# Patient Record
Sex: Female | Born: 1955 | Race: White | Hispanic: No | Smoking: Current every day smoker
Health system: Southern US, Community
[De-identification: ages and names within clinical notes are randomized; demographics above are authoritative.]

## PROBLEM LIST (undated history)

## (undated) DIAGNOSIS — F32A Depression, unspecified: Secondary | ICD-10-CM

## (undated) DIAGNOSIS — E559 Vitamin D deficiency, unspecified: Secondary | ICD-10-CM

## (undated) DIAGNOSIS — F17200 Nicotine dependence, unspecified, uncomplicated: Secondary | ICD-10-CM

## (undated) DIAGNOSIS — E785 Hyperlipidemia, unspecified: Secondary | ICD-10-CM

## (undated) DIAGNOSIS — G4700 Insomnia, unspecified: Secondary | ICD-10-CM

## (undated) DIAGNOSIS — Z7901 Long term (current) use of anticoagulants: Secondary | ICD-10-CM

## (undated) DIAGNOSIS — K760 Fatty (change of) liver, not elsewhere classified: Secondary | ICD-10-CM

## (undated) DIAGNOSIS — I700 Atherosclerosis of aorta: Secondary | ICD-10-CM

## (undated) DIAGNOSIS — I517 Cardiomegaly: Secondary | ICD-10-CM

## (undated) DIAGNOSIS — J449 Chronic obstructive pulmonary disease, unspecified: Secondary | ICD-10-CM

## (undated) DIAGNOSIS — M1990 Unspecified osteoarthritis, unspecified site: Secondary | ICD-10-CM

## (undated) DIAGNOSIS — N1831 Chronic kidney disease, stage 3a: Secondary | ICD-10-CM

## (undated) DIAGNOSIS — R0602 Shortness of breath: Secondary | ICD-10-CM

## (undated) DIAGNOSIS — M810 Age-related osteoporosis without current pathological fracture: Secondary | ICD-10-CM

## (undated) DIAGNOSIS — J439 Emphysema, unspecified: Secondary | ICD-10-CM

## (undated) DIAGNOSIS — K219 Gastro-esophageal reflux disease without esophagitis: Secondary | ICD-10-CM

## (undated) DIAGNOSIS — E278 Other specified disorders of adrenal gland: Secondary | ICD-10-CM

## (undated) DIAGNOSIS — I10 Essential (primary) hypertension: Secondary | ICD-10-CM

## (undated) DIAGNOSIS — F419 Anxiety disorder, unspecified: Secondary | ICD-10-CM

## (undated) DIAGNOSIS — G4730 Sleep apnea, unspecified: Secondary | ICD-10-CM

## (undated) DIAGNOSIS — K429 Umbilical hernia without obstruction or gangrene: Secondary | ICD-10-CM

## (undated) DIAGNOSIS — M5030 Other cervical disc degeneration, unspecified cervical region: Secondary | ICD-10-CM

## (undated) DIAGNOSIS — L409 Psoriasis, unspecified: Secondary | ICD-10-CM

## (undated) DIAGNOSIS — D72821 Monocytosis (symptomatic): Secondary | ICD-10-CM

## (undated) DIAGNOSIS — G4733 Obstructive sleep apnea (adult) (pediatric): Secondary | ICD-10-CM

## (undated) DIAGNOSIS — I4891 Unspecified atrial fibrillation: Secondary | ICD-10-CM

## (undated) DIAGNOSIS — I739 Peripheral vascular disease, unspecified: Secondary | ICD-10-CM

## (undated) DIAGNOSIS — K449 Diaphragmatic hernia without obstruction or gangrene: Secondary | ICD-10-CM

## (undated) DIAGNOSIS — M51369 Other intervertebral disc degeneration, lumbar region without mention of lumbar back pain or lower extremity pain: Secondary | ICD-10-CM

## (undated) DIAGNOSIS — R0789 Other chest pain: Secondary | ICD-10-CM

## (undated) DIAGNOSIS — Z8739 Personal history of other diseases of the musculoskeletal system and connective tissue: Secondary | ICD-10-CM

## (undated) DIAGNOSIS — IMO0002 Reserved for concepts with insufficient information to code with codable children: Secondary | ICD-10-CM

## (undated) DIAGNOSIS — R229 Localized swelling, mass and lump, unspecified: Secondary | ICD-10-CM

## (undated) DIAGNOSIS — M5136 Other intervertebral disc degeneration, lumbar region: Secondary | ICD-10-CM

## (undated) DIAGNOSIS — G250 Essential tremor: Secondary | ICD-10-CM

## (undated) DIAGNOSIS — K859 Acute pancreatitis without necrosis or infection, unspecified: Secondary | ICD-10-CM

## (undated) DIAGNOSIS — J432 Centrilobular emphysema: Secondary | ICD-10-CM

## (undated) HISTORY — DX: Gastro-esophageal reflux disease without esophagitis: K21.9

## (undated) HISTORY — DX: Psoriasis, unspecified: L40.9

## (undated) HISTORY — DX: Cardiomegaly: I51.7

## (undated) HISTORY — DX: Chronic obstructive pulmonary disease, unspecified: J44.9

## (undated) HISTORY — DX: Age-related osteoporosis without current pathological fracture: M81.0

## (undated) HISTORY — DX: Localized swelling, mass and lump, unspecified: R22.9

## (undated) HISTORY — DX: Unspecified osteoarthritis, unspecified site: M19.90

## (undated) HISTORY — DX: Personal history of other diseases of the musculoskeletal system and connective tissue: Z87.39

## (undated) HISTORY — DX: Diaphragmatic hernia without obstruction or gangrene: K44.9

## (undated) HISTORY — PX: OTHER SURGICAL HISTORY: SHX169

## (undated) HISTORY — PX: ACHILLES TENDON REPAIR: SUR1153

## (undated) HISTORY — DX: Emphysema, unspecified: J43.9

## (undated) HISTORY — PX: ANTERIOR CERVICAL DECOMP/DISCECTOMY FUSION: SHX1161

## (undated) HISTORY — DX: Reserved for concepts with insufficient information to code with codable children: IMO0002

## (undated) HISTORY — DX: Essential (primary) hypertension: I10

## (undated) HISTORY — DX: Sleep apnea, unspecified: G47.30

## (undated) HISTORY — PX: ABDOMINAL HYSTERECTOMY: SHX81

## (undated) HISTORY — DX: Monocytosis (symptomatic): D72.821

## (undated) HISTORY — DX: Hyperlipidemia, unspecified: E78.5

## (undated) HISTORY — PX: APPENDECTOMY: SHX54

## (undated) HISTORY — PX: HERNIA REPAIR: SHX51

## (undated) HISTORY — DX: Depression, unspecified: F32.A

## (undated) HISTORY — PX: TONSILLECTOMY: SUR1361

---

## 2004-07-16 ENCOUNTER — Other Ambulatory Visit: Payer: Self-pay

## 2004-07-17 ENCOUNTER — Other Ambulatory Visit: Payer: Self-pay

## 2004-09-04 ENCOUNTER — Ambulatory Visit: Payer: Self-pay | Admitting: Family Medicine

## 2005-09-07 ENCOUNTER — Ambulatory Visit: Payer: Self-pay | Admitting: Family Medicine

## 2006-01-04 ENCOUNTER — Ambulatory Visit: Payer: Self-pay | Admitting: Family Medicine

## 2006-03-02 ENCOUNTER — Emergency Department: Payer: Self-pay | Admitting: Emergency Medicine

## 2006-05-05 ENCOUNTER — Ambulatory Visit: Payer: Self-pay

## 2006-07-30 ENCOUNTER — Ambulatory Visit: Payer: Self-pay | Admitting: Family Medicine

## 2006-09-21 ENCOUNTER — Ambulatory Visit: Payer: Self-pay | Admitting: Family Medicine

## 2007-02-08 ENCOUNTER — Encounter: Payer: Self-pay | Admitting: Family Medicine

## 2007-02-15 ENCOUNTER — Ambulatory Visit: Payer: Self-pay | Admitting: Family Medicine

## 2007-03-01 ENCOUNTER — Encounter: Payer: Self-pay | Admitting: Family Medicine

## 2007-04-27 ENCOUNTER — Emergency Department: Payer: Self-pay | Admitting: Emergency Medicine

## 2007-05-18 ENCOUNTER — Ambulatory Visit (HOSPITAL_COMMUNITY): Admission: RE | Admit: 2007-05-18 | Discharge: 2007-05-19 | Payer: Self-pay | Admitting: Orthopedic Surgery

## 2007-11-14 ENCOUNTER — Ambulatory Visit: Payer: Self-pay | Admitting: Gastroenterology

## 2007-11-15 ENCOUNTER — Ambulatory Visit: Payer: Self-pay | Admitting: Family Medicine

## 2007-11-15 ENCOUNTER — Ambulatory Visit: Payer: Self-pay

## 2009-01-18 ENCOUNTER — Emergency Department: Payer: Self-pay | Admitting: Emergency Medicine

## 2009-02-18 ENCOUNTER — Ambulatory Visit: Payer: Self-pay

## 2009-04-12 ENCOUNTER — Emergency Department: Payer: Self-pay | Admitting: Emergency Medicine

## 2011-04-14 NOTE — Op Note (Signed)
[name], BUNDRICK               ACCOUNT NO.:  [id number]   MEDICAL RECORD NO.:  [id number]          PATIENT TYPE:  OIB   LOCATION:  2550                         FACILITY:  [facility]   PHYSICIAN:  Alvy Beal, MD    DATE OF BIRTH:  1955-12-08   DATE OF PROCEDURE:  05/18/2007  DATE OF DISCHARGE:                               OPERATIVE REPORT   PREOPERATIVE DIAGNOSIS:  Hard disk osteophyte causing left radicular arm  pain at C6-7.   POSTOPERATIVE DIAGNOSIS:  Hard disk osteophyte causing left radicular  arm pain at C6-7.   OPERATIVE PROCEDURE:  Anterior cervical diskectomy and fusion C6-C7.   COMPLICATIONS:  None.   CONDITION:  Stable.   HISTORY:  This is a very pleasant woman who presented in my office with  severe neck and left arm pain.  Preoperative imaging and clinical exam  confirmed the diagnosis of C7 radiculopathy due to a hard disk  osteophyte compressing the C7 nerve root at C6-7.  After attempting  conservative management, the patient elected to proceed with surgery.  All appropriate risks, benefits and alternatives to surgery were  explained to the patient and consent was obtained.   INSTRUMENTATION USED:  Synthes anterior Vector plate with 14 mm variable  angle screws with a 7 mm lordotic spacer.   OPERATIVE NOTE:  The patient was brought into the operating room and  placed supine on the operating room table.  After successful induction  of general anesthesia and endotracheal intubation, TEDs, SCDs and a  Foley were applied.  The roll of towels were placed between the neck.  The arms were cocooned at the side and tape was used to inferiorly  depress the shoulder blades.  The neck was then prepped and draped in a  standard fashion.  A longitudinal incision was made at the inferior  aspect of the thyroid cartilage and proceeding to the left lateral side.  Sharp dissection was carried out down to the platysma.  The platysma was  then sharply dissected to expose  the deep cervical fascia.  The deep  cervical fascia was then sharply dissected in a plane that kept the  trachea and esophagus medial and the carotid sheath lateral.  Once we  had dissected through the deep cervical fascia, I bluntly dissected  through the prevertebral fascia down to the anterior aspect of the  spine.  Appendiceal retractor was then used to sweep the esophagus and  the trachea medially and I could see the anterior longitudinal ligament.  I then placed an 18 gauge spinal needle into the interbody space and  took a lateral x-ray.  Counting from C2 down, I was at the C6-7  interbody space.  I then marked this disk space with the Bovie and then  removed the pen.   I then dissected the anterior longitudinal ligament from the mid body of  C6 to the mid body of C7 and dissected out the longus coli muscles out  to visualize the uncovertebral joint.  The Caspar self-retaining  cervical retracting blades were then placed in the wound.  The  endotracheal cuff  was deflated.  The blades were expanded to the  appropriate width and the endotracheal cuff was reexpanded.  I then put  2 distraction pins in, 1 in the body of C6 and 1 in the body of C7 and  distracted the 6-7 interspace.  A knife was then used to perform an  annulotomy and then used a combination of curets and pituitary rongeurs.  I resected the disk.  Using a 3-mm Kerrison, I resected the anterior  overhang of the osteophyte from the body of C6.  I then used fine curets  to develop a plane between the posterior longitudinal ligament and the  dura.  Then using the 1-mm Kerrison, I resected the posterior  longitudinal ligament.  I then used that 1-mm Kerrison to resect the  osteophytes that were overhanging in the uncovertebral joint.   At this point, with an adequate decompression completed, I irrigated the  wound copiously with normal saline.  I then used a curet to pierce the  end plates to get bleeding end plates and  then measured the interbody  space.  I then placed a precut 7 mm lordotic and KLS cervical graft  packed with DBX into the wound.  I confirmed the appropriate depth.  I  then placed a contoured plate onto the body of C6-7 and secured it with  14 mm variable angled screws.  Using fluoroscopy, I confirmed the depth  of the graft and appropriate position of the hardware in both the AP and  lateral planes.   At this point, all of the retractors were removed.  I then checked the  esophagus to ensure that it was not entrapped underneath the plate and  then returned the trachea and esophagus to midline.  I then irrigated  the wound copiously with normal saline, closed the platysma with  interrupted 2-0 Vicryls sutures and the skin with 3-0 Monocryl.  Steri-  Strips and dry dressing were applied.  The patient was extubated and  transferred to the PACU without incident.  At the end of the case, all  needle and sponge counts were correct.   The patient will be admitted today and discharged tomorrow.  She will  follow up in 2 weeks for a wound check.      Alvy Beal, MD  Electronically Signed     DDB/MEDQ  D:  05/18/2007  T:  05/18/2007  Job:  [id number]

## 2011-04-17 NOTE — H&P (Signed)
NAMEKRISTENA, [name]               ACCOUNT NO.:  [id number]   MEDICAL RECORD NO.:  [id number]          PATIENT TYPE:  OIB   LOCATION:  5010                         FACILITY:  [facility]   PHYSICIAN:  Alvy Beal, MD    DATE OF BIRTH:  1956/03/05   DATE OF ADMISSION:  05/18/2007  DATE OF DISCHARGE:  05/19/2007                              HISTORY & PHYSICAL   HISTORY:  [name] is a very pleasant 55 year old woman, a primary  caregiver, who has been experiencing significant neck and left arm pain  for the previous 6-7 months.  She could not recall any specific injury,  but she does note that her pain began as just neck pain, and then has  gradually gotten worse.  She now has what she declares as debilitating  left arm pain.  She has tried a course of physiotherapy, which made  things worse.  She states that her problem is now preventing her from  doing anything significant.   PAST MEDICAL HISTORY:   PAST SURGICAL HISTORY:   FAMILY HISTORY:   SOCIAL HISTORY:  She has no known drug allergies.  She smokes a pack a  day.  She does not drink. She has history of high blood pressure and  some pulmonary issues secondary to her smoking.   She is taking albuterol, aspirin, calcium plus D, muscle relaxants,  Vicodin, lorazepam, lovastatin, and some other antihypertensive  medication she cannot recall.   PHYSICAL EXAMINATION:  GENERAL:  She is a pleasant woman who appears her  stated age, in no acute distress.  She is alert and oriented x3.  NEUROLOGIC:  She has downgoing toes on Babinski testing.  She has  positive Spurling sign on the left upper extremity.  She has 5/5  strength throughout in the upper extremity except for the wrist flexors,  and her triceps both being 4/5 on that left side.  Symmetrical deep  tendon reflexes are 2+ and regular, except for the triceps on the right  which is 1+.   She has a normal gait pattern, negative Hoffman, negative Babinski sign.   MRI demonstrates  a hard disk osteophyte at C6-7 causing mild  retrolisthesis of C6 on C7, with significant left lateral recess  stenosis, and moderate right lateral recess stenosis.   Because of the ongoing pain and neurological deficits, and the failure  of conservative management, we elected to proceed with an anterior  cervical diskectomy and fusion.  As per my office notes, her clinical  examination is essentially unchanged prior to surgery.  I have reviewed  all the risks, benefits, and alternatives. Please again refer to my  office notes for specifics, and the patient consented to the  aforementioned procedure.      Alvy Beal, MD  Electronically Signed     DDB/MEDQ  D:  07/20/2007  T:  07/20/2007  Job:  [id number]

## 2011-04-17 NOTE — H&P (Signed)
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   HISTORY:  She is a very pleasant 55 year old woman who presented to my  office with complaints of severe left arm pain and neck pain.  Attempts  at conservative management consisting of physiotherapy and other  modalities had failed to alleviate her symptoms.  After discussing  treatment options, we elected to proceed with surgery.  All appropriate  risks, benefits and alternatives were explained to the patient and  consent was obtained.   The patient's preoperative evaluation was consistent with a C7  radiculopathy with tricep weakness and sensory changes in the C7  distribution on the left.  She had no other significant abnormalities.  Appropriate preoperative clearance was obtained.  The patient was taken  the operating room on May 18, 2007 for a planned anterior cervical  diskectomy and fusion at C6-7.  Please refer to my office notes that are  in her hospital chart for further specifics on her diagnosis and  condition.      Alvy Beal, MD  Electronically Signed     DDB/MEDQ  D:  08/09/2007  T:  08/10/2007  Job:  [id number]

## 2011-07-01 ENCOUNTER — Ambulatory Visit: Payer: Self-pay | Admitting: Family Medicine

## 2011-09-17 LAB — CBC
HCT: 42.6
Hemoglobin: 14.6
MCHC: 34.2
MCV: 91.4
Platelets: 342
RBC: 4.66
RDW: 14.7 — ABNORMAL HIGH
WBC: 10.3

## 2011-09-17 LAB — URINALYSIS, ROUTINE W REFLEX MICROSCOPIC
Bilirubin Urine: NEGATIVE
Glucose, UA: NEGATIVE
Hgb urine dipstick: NEGATIVE
Ketones, ur: NEGATIVE
Leukocytes, UA: NEGATIVE
Nitrite: NEGATIVE
Protein, ur: NEGATIVE
Specific Gravity, Urine: 1.011 (ref 1.005–1.035)
Urobilinogen, UA: 0.2
pH: 6

## 2011-09-17 LAB — BASIC METABOLIC PANEL
BUN: 8
CO2: 28
Calcium: 9.4
Chloride: 103
Creatinine, Ser: 0.66
GFR calc Af Amer: >60
GFR calc non Af Amer: >60
Glucose, Bld: 96
Potassium: 4.2
Sodium: 140

## 2011-09-17 LAB — APTT: aPTT: 30

## 2011-09-17 LAB — PROTIME-INR
INR: 1
Prothrombin Time: 13.2

## 2013-10-24 ENCOUNTER — Ambulatory Visit: Payer: Self-pay | Admitting: Gastroenterology

## 2013-11-03 ENCOUNTER — Ambulatory Visit: Payer: Self-pay | Admitting: Gastroenterology

## 2014-03-23 ENCOUNTER — Emergency Department: Payer: Self-pay | Admitting: Emergency Medicine

## 2014-03-23 LAB — BASIC METABOLIC PANEL
Anion Gap: 3 — ABNORMAL LOW (ref 7–16)
BUN: 13 mg/dL (ref 7–18)
Calcium, Total: 9.3 mg/dL (ref 8.5–10.1)
Chloride: 108 mmol/L — ABNORMAL HIGH (ref 98–107)
Co2: 31 mmol/L (ref 21–32)
Creatinine: 0.99 mg/dL (ref 0.60–1.30)
EGFR (African American): >60
EGFR (Non-African Amer.): >60
Glucose: 103 mg/dL — ABNORMAL HIGH (ref 65–99)
Osmolality: 283 (ref 275–301)
Potassium: 3.6 mmol/L (ref 3.5–5.1)
Sodium: 142 mmol/L (ref 136–145)

## 2014-03-23 LAB — PROTIME-INR
INR: 0.9
Prothrombin Time: 12.5 secs (ref 11.5–14.7)

## 2014-03-23 LAB — CBC
HCT: 40.1 % (ref 35.0–47.0)
HGB: 12.9 g/dL (ref 12.0–16.0)
MCH: 30 pg (ref 26.0–34.0)
MCHC: 32.3 g/dL (ref 32.0–36.0)
MCV: 93 fL (ref 80–100)
Platelet: 308 10*3/uL (ref 150–440)
RBC: 4.31 10*6/uL (ref 3.80–5.20)
RDW: 14.7 % — ABNORMAL HIGH (ref 11.5–14.5)
WBC: 14.1 10*3/uL — ABNORMAL HIGH (ref 3.6–11.0)

## 2014-08-17 ENCOUNTER — Ambulatory Visit: Payer: Self-pay | Admitting: Family Medicine

## 2015-05-14 DIAGNOSIS — M51369 Other intervertebral disc degeneration, lumbar region without mention of lumbar back pain or lower extremity pain: Secondary | ICD-10-CM | POA: Insufficient documentation

## 2015-05-14 DIAGNOSIS — M5416 Radiculopathy, lumbar region: Secondary | ICD-10-CM | POA: Insufficient documentation

## 2015-05-14 DIAGNOSIS — F410 Panic disorder [episodic paroxysmal anxiety] without agoraphobia: Secondary | ICD-10-CM | POA: Insufficient documentation

## 2015-05-14 DIAGNOSIS — R252 Cramp and spasm: Secondary | ICD-10-CM | POA: Insufficient documentation

## 2015-05-14 DIAGNOSIS — E785 Hyperlipidemia, unspecified: Secondary | ICD-10-CM | POA: Insufficient documentation

## 2015-05-14 DIAGNOSIS — G4730 Sleep apnea, unspecified: Secondary | ICD-10-CM | POA: Insufficient documentation

## 2015-05-14 DIAGNOSIS — M5136 Other intervertebral disc degeneration, lumbar region: Secondary | ICD-10-CM | POA: Insufficient documentation

## 2015-05-14 DIAGNOSIS — F419 Anxiety disorder, unspecified: Secondary | ICD-10-CM | POA: Insufficient documentation

## 2015-05-14 DIAGNOSIS — K219 Gastro-esophageal reflux disease without esophagitis: Secondary | ICD-10-CM | POA: Insufficient documentation

## 2015-05-14 DIAGNOSIS — G250 Essential tremor: Secondary | ICD-10-CM | POA: Insufficient documentation

## 2015-05-14 DIAGNOSIS — F324 Major depressive disorder, single episode, in partial remission: Secondary | ICD-10-CM | POA: Insufficient documentation

## 2015-05-14 DIAGNOSIS — E669 Obesity, unspecified: Secondary | ICD-10-CM | POA: Insufficient documentation

## 2015-05-14 DIAGNOSIS — J449 Chronic obstructive pulmonary disease, unspecified: Secondary | ICD-10-CM | POA: Insufficient documentation

## 2015-05-14 DIAGNOSIS — J432 Centrilobular emphysema: Secondary | ICD-10-CM | POA: Insufficient documentation

## 2015-05-17 ENCOUNTER — Ambulatory Visit (INDEPENDENT_AMBULATORY_CARE_PROVIDER_SITE_OTHER): Payer: BLUE CROSS/BLUE SHIELD | Admitting: Unknown Physician Specialty

## 2015-05-17 ENCOUNTER — Encounter: Payer: Self-pay | Admitting: Unknown Physician Specialty

## 2015-05-17 VITALS — BP 143/81 | HR 82 | Temp 98.7°F | Ht 62.5 in | Wt 264.0 lb

## 2015-05-17 DIAGNOSIS — N189 Chronic kidney disease, unspecified: Secondary | ICD-10-CM | POA: Diagnosis not present

## 2015-05-17 DIAGNOSIS — F332 Major depressive disorder, recurrent severe without psychotic features: Secondary | ICD-10-CM

## 2015-05-17 DIAGNOSIS — G8929 Other chronic pain: Secondary | ICD-10-CM | POA: Insufficient documentation

## 2015-05-17 DIAGNOSIS — I129 Hypertensive chronic kidney disease with stage 1 through stage 4 chronic kidney disease, or unspecified chronic kidney disease: Secondary | ICD-10-CM

## 2015-05-17 DIAGNOSIS — F329 Major depressive disorder, single episode, unspecified: Secondary | ICD-10-CM | POA: Insufficient documentation

## 2015-05-17 MED ORDER — BUPROPION HCL ER (XL) 150 MG PO TB24: 150.0000 mg | ORAL_TABLET | Freq: Every day | ORAL | Status: DC

## 2015-05-17 MED ORDER — VALSARTAN-HYDROCHLOROTHIAZIDE 160-12.5 MG PO TABS: 1.0000 | ORAL_TABLET | Freq: Every day | ORAL | Status: DC

## 2015-05-17 MED ORDER — CLONAZEPAM 1 MG PO TABS: 1.0000 mg | ORAL_TABLET | Freq: Two times a day (BID) | ORAL | Status: DC

## 2015-05-17 MED ORDER — DULOXETINE HCL 30 MG PO CPEP: 30.0000 mg | ORAL_CAPSULE | Freq: Every day | ORAL | Status: DC

## 2015-05-17 MED ORDER — BUSPIRONE HCL 10 MG PO TABS: 10.0000 mg | ORAL_TABLET | Freq: Two times a day (BID) | ORAL | Status: DC

## 2015-05-17 NOTE — Progress Notes (Signed)
BP 143/81 mmHg  Pulse 82  Temp(Src) 98.7 F (37.1 C) (Oral)  Ht 5' 2.5" (1.588 m)  Wt 264 lb (119.75 kg)  BMI 47.49 kg/m2  SpO2 97%   Subjective:    Patient ID: [name], female    DOB: 10/30/1956, 59 y.o.   MRN: [id number]  HPI: [name] is a 59 y.o. female  Chief Complaint  Patient presents with  . Follow-up    Relevant past medical, surgical, family and social history reviewed and updated as indicated. Interim medical history since our last visit reviewed. Allergies and medications reviewed and updated.  1.  HYPERTENSION Patient is requesting refill(s). Hypertension status:  uncontrolled  H6  Satisfied with current treatment?  yes  H6  Duration of hypertension:  chronic  H4  BP monitoring frequency:  not checking - does have BP cuff at home.  H5  BP medication side effects:  no P1  Medication compliance:  usually uses medication as prescribed   P1  Aspirin:  yes  Recurrent headaches:  no  R10 Visual changes:  no  R2  Palpitations:  no  R4  Dyspnea:  yes, chronic for her with COPD  R5  Chest pain:  no  R4 Lower extremity edema:  yes  R4 Dizzy/lightheaded:  no  R10   2.  ANXIETY Patient is requesting refill(s). Mood status:  controlled  H6  Satisfied with current treatment?:  yes  H2 Medication side effects:  no P1  Medication compliance:  excellent compliance  P1 Symptom severity:  severe H3  Anxious mood:  yes  H2 Irritability:  yes  H8 Depressed mood:  yes  H2 .H: Depression Screen X  PHQ - 9: 9 Anhedonia:  yes  H3 Insomnia:  no  H8 Fatigue:  no  H8 Impaired concentration/indecisiveness:  yes  H8 Suicidal ideations:  no  H8 Palpitations:  no  H8 Hyperventilation:  no  H8 Panic attacks:  yes - last one about 3 weeks ago - they most often happen when she is in large crowds  H8 Agoraphobia:  yes  H8 Obsessive thoughts: no  H8 Compulsive behaviors:  no  H8  3.  HIP PAIN She was unable to go to the pain clinic as needed another  referral Duration:  chronic  H4  Involved hip:  right  H1  Mechanism of injury:  no trauma.  H6  Location:  lateral  H1 Onset:  sudden, spasm  H5  Severity:  severe H3  Quality:  sharp H2  Frequency:  a few times a day H5  Radiation:  yes  H6 Alleviating factors:  NSAIDs, carisoprodol .  H7  Status:  stable  H6 Relief with NSAIDs?:  mild  H7  Weakness with weight bearing:  yes  R8   Weakness with walking:  yes  R8 Paresthesias / decreased sensation:  no   R10  Swelling:  yes  R8 Redness:  no  R9  Fevers:  no  R1   Depression: My sister doesn't think my anti-depressants are working." States she will "cuss you out."  States she is in in pain and "don't sleep."  She had been on Proac which helped but was on the max dose and taken off.  Tried Effexor that didn't work.  Has not been on Cymbalta that she can remember.    Review of Systems  Psychiatric/Behavioral: Positive for sleep disturbance, decreased concentration and agitation. Negative for hallucinations and dysphoric mood. The patient is  nervous/anxious. The patient is not hyperactive.        See PHQ 9    Per HPI unless specifically indicated above     Objective:    BP 143/81 mmHg  Pulse 82  Temp(Src) 98.7 F (37.1 C) (Oral)  Ht 5' 2.5" (1.588 m)  Wt 264 lb (119.75 kg)  BMI 47.49 kg/m2  SpO2 97%  Wt Readings from Last 3 Encounters:  05/17/15 264 lb (119.75 kg)  02/22/15 261 lb (118.389 kg)    Physical Exam  Constitutional: She is oriented to person, place, and time. She appears well-developed and well-nourished. No distress.  HENT:  Head: Normocephalic and atraumatic.  Eyes: Conjunctivae and lids are normal. Right eye exhibits no discharge. Left eye exhibits no discharge. No scleral icterus.  Cardiovascular: Normal rate, regular rhythm and normal heart sounds.   Pulmonary/Chest: Effort normal. No respiratory distress.  Abdominal: Normal appearance. She exhibits no distension. There is no splenomegaly or  hepatomegaly. There is no tenderness.  Musculoskeletal: Normal range of motion.  Neurological: She is alert and oriented to person, place, and time.  Skin: Skin is intact. No rash noted. No pallor.  Psychiatric: She has a normal mood and affect. Her behavior is normal. Judgment and thought content normal.    Assessment & Plan:   Problem List Items Addressed This Visit      Cardiovascular and Mediastinum   Benign hypertension with chronic kidney disease    Good control today.  Last GFR was above 60      Relevant Medications   valsartan-hydrochlorothiazide (DIOVAN-HCT) 160-12.5 MG per tablet     Other   Major depression - Primary    Add Cymbalta and hopefully will help with pain      Relevant Medications   DULoxetine (CYMBALTA) 30 MG capsule   buPROPion (WELLBUTRIN XL) 150 MG 24 hr tablet   busPIRone (BUSPAR) 10 MG tablet   Chronic pain    Refer to pain clinic.  Hopefully Cymbalta will help      Relevant Medications   DULoxetine (CYMBALTA) 30 MG capsule   buPROPion (WELLBUTRIN XL) 150 MG 24 hr tablet   clonazePAM (KLONOPIN) 1 MG tablet   Other Relevant Orders   Ambulatory referral to Pain Clinic       Follow up plan: Return in about 6 weeks (around 06/28/2015) for depression.  Marland Kitchen

## 2015-05-17 NOTE — Assessment & Plan Note (Signed)
Add Cymbalta and hopefully will help with pain

## 2015-05-17 NOTE — Assessment & Plan Note (Signed)
Refer to pain clinic.  Hopefully Cymbalta will help

## 2015-05-17 NOTE — Assessment & Plan Note (Signed)
Good control today.  Last GFR was above 60

## 2015-06-12 ENCOUNTER — Telehealth: Payer: Self-pay | Admitting: Unknown Physician Specialty

## 2015-06-28 ENCOUNTER — Ambulatory Visit: Payer: BLUE CROSS/BLUE SHIELD | Admitting: Unknown Physician Specialty

## 2015-07-01 ENCOUNTER — Encounter: Payer: Self-pay | Admitting: Unknown Physician Specialty

## 2015-07-01 ENCOUNTER — Ambulatory Visit (INDEPENDENT_AMBULATORY_CARE_PROVIDER_SITE_OTHER): Payer: BLUE CROSS/BLUE SHIELD | Admitting: Unknown Physician Specialty

## 2015-07-01 VITALS — BP 152/76 | HR 90 | Temp 98.1°F | Ht 63.1 in | Wt 263.0 lb

## 2015-07-01 DIAGNOSIS — F322 Major depressive disorder, single episode, severe without psychotic features: Secondary | ICD-10-CM | POA: Diagnosis not present

## 2015-07-01 DIAGNOSIS — G8929 Other chronic pain: Secondary | ICD-10-CM

## 2015-07-01 DIAGNOSIS — F329 Major depressive disorder, single episode, unspecified: Secondary | ICD-10-CM | POA: Insufficient documentation

## 2015-07-01 NOTE — Assessment & Plan Note (Signed)
Continue present medications.  Doing much better

## 2015-07-01 NOTE — Progress Notes (Signed)
   BP 152/76 mmHg  Pulse 90  Temp(Src) 98.1 F (36.7 C)  Ht 5' 3.1" (1.603 m)  Wt 263 lb (119.296 kg)  BMI 46.43 kg/m2  SpO2 94%  LMP  (LMP Unknown)   Subjective:    Patient ID: [name], female    DOB: Apr 09, 1956, 59 y.o.   MRN: [id number]  HPI: [name] is a 59 y.o. female  Chief Complaint  Patient presents with  . Depression    Relevant past medical, surgical, family and social history reviewed and updated as indicated. Interim medical history since our last visit reviewed. Allergies and medications reviewed and updated.  Depression She is doing much better.  No more mood swings.  Not snapping at others.  PHQ 2 is 1.  States the Cymbalta is not helping her chronic pain.  For some reason, the pain clinic referral never went through.      Review of Systems  Per HPI unless specifically indicated above     Objective:    BP 152/76 mmHg  Pulse 90  Temp(Src) 98.1 F (36.7 C)  Ht 5' 3.1" (1.603 m)  Wt 263 lb (119.296 kg)  BMI 46.43 kg/m2  SpO2 94%  LMP  (LMP Unknown)  Wt Readings from Last 3 Encounters:  07/01/15 263 lb (119.296 kg)  05/17/15 264 lb (119.75 kg)  02/22/15 261 lb (118.389 kg)    Physical Exam  Constitutional: She is oriented to person, place, and time. She appears well-developed and well-nourished. No distress.  Uses a walker  HENT:  Head: Normocephalic and atraumatic.  Eyes: Conjunctivae and lids are normal. Right eye exhibits no discharge. Left eye exhibits no discharge. No scleral icterus.  Pulmonary/Chest: Effort normal. No respiratory distress.  Abdominal: Normal appearance. There is no splenomegaly or hepatomegaly.  Musculoskeletal: Normal range of motion.  Neurological: She is alert and oriented to person, place, and time.  Skin: Skin is intact. No rash noted. No pallor.  Psychiatric: She has a normal mood and affect. Her behavior is normal. Judgment and thought content normal.      Assessment & Plan:   Problem List  Items Addressed This Visit      Unprioritized   Chronic pain - Primary   Relevant Orders   Ambulatory referral to Pain Clinic   Major depression, chronic    Continue present medications.  Doing much better          Follow up plan: Return for physical.

## 2015-07-04 ENCOUNTER — Other Ambulatory Visit: Payer: Self-pay | Admitting: Unknown Physician Specialty

## 2015-07-11 ENCOUNTER — Ambulatory Visit: Payer: BLUE CROSS/BLUE SHIELD | Attending: Pain Medicine | Admitting: Pain Medicine

## 2015-07-11 ENCOUNTER — Encounter: Payer: Self-pay | Admitting: Pain Medicine

## 2015-07-11 VITALS — BP 160/46 | HR 87 | Temp 98.2°F | Resp 18 | Ht 63.0 in | Wt 260.0 lb

## 2015-07-11 DIAGNOSIS — M79605 Pain in left leg: Secondary | ICD-10-CM | POA: Diagnosis present

## 2015-07-11 DIAGNOSIS — M4806 Spinal stenosis, lumbar region: Secondary | ICD-10-CM | POA: Insufficient documentation

## 2015-07-11 DIAGNOSIS — I10 Essential (primary) hypertension: Secondary | ICD-10-CM | POA: Diagnosis not present

## 2015-07-11 DIAGNOSIS — K219 Gastro-esophageal reflux disease without esophagitis: Secondary | ICD-10-CM | POA: Diagnosis not present

## 2015-07-11 DIAGNOSIS — M1288 Other specific arthropathies, not elsewhere classified, other specified site: Secondary | ICD-10-CM | POA: Diagnosis not present

## 2015-07-11 DIAGNOSIS — M1710 Unilateral primary osteoarthritis, unspecified knee: Secondary | ICD-10-CM | POA: Insufficient documentation

## 2015-07-11 DIAGNOSIS — M5136 Other intervertebral disc degeneration, lumbar region: Secondary | ICD-10-CM | POA: Insufficient documentation

## 2015-07-11 DIAGNOSIS — F17200 Nicotine dependence, unspecified, uncomplicated: Secondary | ICD-10-CM | POA: Diagnosis not present

## 2015-07-11 DIAGNOSIS — M545 Low back pain: Secondary | ICD-10-CM | POA: Diagnosis present

## 2015-07-11 DIAGNOSIS — M170 Bilateral primary osteoarthritis of knee: Secondary | ICD-10-CM

## 2015-07-11 DIAGNOSIS — M47816 Spondylosis without myelopathy or radiculopathy, lumbar region: Secondary | ICD-10-CM | POA: Insufficient documentation

## 2015-07-11 DIAGNOSIS — Z981 Arthrodesis status: Secondary | ICD-10-CM | POA: Insufficient documentation

## 2015-07-11 DIAGNOSIS — M5416 Radiculopathy, lumbar region: Secondary | ICD-10-CM | POA: Diagnosis not present

## 2015-07-11 DIAGNOSIS — M79604 Pain in right leg: Secondary | ICD-10-CM | POA: Diagnosis present

## 2015-07-11 DIAGNOSIS — F418 Other specified anxiety disorders: Secondary | ICD-10-CM | POA: Insufficient documentation

## 2015-07-11 DIAGNOSIS — M48062 Spinal stenosis, lumbar region with neurogenic claudication: Secondary | ICD-10-CM | POA: Insufficient documentation

## 2015-07-11 DIAGNOSIS — M179 Osteoarthritis of knee, unspecified: Secondary | ICD-10-CM | POA: Insufficient documentation

## 2015-07-11 NOTE — Patient Instructions (Addendum)
Smoking Cessation Quitting smoking is important to your health and has many advantages. However, it is not always easy to quit since nicotine is a very addictive drug. Oftentimes, people try 3 times or more before being able to quit. This document explains the best ways for you to prepare to quit smoking. Quitting takes hard work and a lot of effort, but you can do it. ADVANTAGES OF QUITTING SMOKING 1. You will live longer, feel better, and live better. 2. Your body will feel the impact of quitting smoking almost immediately. 1. Within 20 minutes, blood pressure decreases. Your pulse returns to its normal level. 2. After 8 hours, carbon monoxide levels in the blood return to normal. Your oxygen level increases. 3. After 24 hours, the chance of having a heart attack starts to decrease. Your breath, hair, and body stop smelling like smoke. 4. After 48 hours, damaged nerve endings begin to recover. Your sense of taste and smell improve. 5. After 72 hours, the body is virtually free of nicotine. Your bronchial tubes relax and breathing becomes easier. 6. After 2 to 12 weeks, lungs can hold more air. Exercise becomes easier and circulation improves. 3. The risk of having a heart attack, stroke, cancer, or lung disease is greatly reduced. 1. After 1 year, the risk of coronary heart disease is cut in half. 2. After 5 years, the risk of stroke falls to the same as a nonsmoker. 3. After 10 years, the risk of lung cancer is cut in half and the risk of other cancers decreases significantly. 4. After 15 years, the risk of coronary heart disease drops, usually to the level of a nonsmoker. 4. If you are pregnant, quitting smoking will improve your chances of having a healthy baby. 5. The people you live with, especially any children, will be healthier. 6. You will have extra money to spend on things other than cigarettes. QUESTIONS TO THINK ABOUT BEFORE ATTEMPTING TO QUIT You may want to talk about your  answers with your health care provider. 1. Why do you want to quit? 2. If you tried to quit in the past, what helped and what did not? 3. What will be the most difficult situations for you after you quit? How will you plan to handle them? 4. Who can help you through the tough times? Your family? Friends? A health care provider? 5. What pleasures do you get from smoking? What ways can you still get pleasure if you quit? Here are some questions to ask your health care provider: 1. How can you help me to be successful at quitting? 2. What medicine do you think would be best for me and how should I take it? 3. What should I do if I need more help? 4. What is smoking withdrawal like? How can I get information on withdrawal? GET READY 1. Set a quit date. 2. Change your environment by getting rid of all cigarettes, ashtrays, matches, and lighters in your home, car, or work. Do not let people smoke in your home. 3. Review your past attempts to quit. Think about what worked and what did not. GET SUPPORT AND ENCOURAGEMENT You have a better chance of being successful if you have help. You can get support in many ways.  Tell your family, friends, and coworkers that you are going to quit and need their support. Ask them not to smoke around you.  Get individual, group, or telephone counseling and support. Programs are available at [employer] and health centers. Call  your local health department for information about programs in your area.  Spiritual beliefs and practices may help some smokers quit.  Download a "quit meter" on your computer to keep track of quit statistics, such as how long you have gone without smoking, cigarettes not smoked, and money saved.  Get a self-help book about quitting smoking and staying off tobacco. [place] yourself from urges to smoke. Talk to someone, go for a walk, or occupy your time with a task.  Change your normal routine. Take  a different route to work. Drink tea instead of coffee. Eat breakfast in a different place.  Reduce your stress. Take a hot bath, exercise, or read a book.  Plan something enjoyable to do every day. Reward yourself for not smoking.  Explore interactive web-based programs that specialize in helping you quit. GET MEDICINE AND USE IT CORRECTLY Medicines can help you stop smoking and decrease the urge to smoke. Combining medicine with the above behavioral methods and support can greatly increase your chances of successfully quitting smoking.  Nicotine replacement therapy helps deliver nicotine to your body without the negative effects and risks of smoking. Nicotine replacement therapy includes nicotine gum, lozenges, inhalers, nasal sprays, and skin patches. Some may be available over-the-counter and others require a prescription.  Antidepressant medicine helps people abstain from smoking, but how this works is unknown. This medicine is available by prescription.  Nicotinic receptor partial agonist medicine simulates the effect of nicotine in your brain. This medicine is available by prescription. Ask your health care provider for advice about which medicines to use and how to use them based on your health history. Your health care provider will tell you what side effects to look out for if you choose to be on a medicine or therapy. Carefully read the information on the package. Do not use any other product containing nicotine while using a nicotine replacement product.  RELAPSE OR DIFFICULT SITUATIONS Most relapses occur within the first 3 months after quitting. Do not be discouraged if you start smoking again. Remember, most people try several times before finally quitting. You may have symptoms of withdrawal because your body is used to nicotine. You may crave cigarettes, be irritable, feel very hungry, cough often, get headaches, or have difficulty concentrating. The withdrawal symptoms are only  temporary. They are strongest when you first quit, but they will go away within 10-14 days. To reduce the chances of relapse, try to:  Avoid drinking alcohol. Drinking lowers your chances of successfully quitting.  Reduce the amount of caffeine you consume. Once you quit smoking, the amount of caffeine in your body increases and can give you symptoms, such as a rapid heartbeat, sweating, and anxiety.  Avoid smokers because they can make you want to smoke.  Do not let weight gain distract you. Many smokers will gain weight when they quit, usually less than 10 pounds. Eat a healthy diet and stay active. You can always lose the weight gained after you quit.  Find ways to improve your mood other than smoking. FOR MORE INFORMATION  www.smokefree.gov  Document Released: 11/10/2001 Document Revised: 04/02/2014 Document Reviewed: 02/25/2012 [facility] Patient Information 2015 [place], [place]. This information is not intended to replace advice given to you by your health care provider. Make sure you discuss any questions you have with your health care provider.  Plan  Continue present medications for now  Lumbar epidural steroid injection to be performed at time return appointment  F/U  PCP [name] for evaliation of  BP and general medical  condition  F/U surgical evaluation. We will consider further surgical evaluation  F/U neurological evaluation. We will consider PNCV EMG studies and other studies dependent upon your response to treatment and follow-up evaluation  May consider radiofrequency rhizolysis or intraspinal procedures pending response to present treatment and F/U evaluation   Patient to call Pain Management Center should patient have concerns prior to scheduled return appointmen. Epidural Steroid Injection Patient Information  Description: The epidural space surrounds the nerves as they exit the spinal cord.  In some patients, the nerves can be compressed and inflamed by a bulging  disc or a tight spinal canal (spinal stenosis).  By injecting steroids into the epidural space, we can bring irritated nerves into direct contact with a potentially helpful medication.  These steroids act directly on the irritated nerves and can reduce swelling and inflammation which often leads to decreased pain.  Epidural steroids may be injected anywhere along the spine and from the neck to the low back depending upon the location of your pain.   After numbing the skin with local anesthetic (like Novocaine), a small needle is passed into the epidural space slowly.  You may experience a sensation of pressure while this is being done.  The entire block usually last less than 10 minutes.  Conditions which may be treated by epidural steroids:   Low back and leg pain  Neck and arm pain  Spinal stenosis  Post-laminectomy syndrome  Herpes zoster (shingles) pain  Pain from compression fractures  Preparation for the injection:  6. Do not eat any solid food or dairy products within 6 hours of your appointment.  7. You may drink clear liquids up to 2 hours before appointment.  Clear liquids include water, black coffee, juice or soda.  No milk or cream please. 8. You may take your regular medication, including pain medications, with a sip of water before your appointment  Diabetics should hold regular insulin (if taken separately) and take 1/2 normal NPH dos the morning of the procedure.  Carry some sugar containing items with you to your appointment. 9. A driver must accompany you and be prepared to drive you home after your procedure.  10. Bring all your current medications with your. 11. An IV may be inserted and sedation may be given at the discretion of the physician.   12. A blood pressure cuff, EKG and other monitors will often be applied during the procedure.  Some patients may need to have extra oxygen administered for a short period. 63. You will be asked to provide medical information,  including your allergies, prior to the procedure.  We must know immediately if you are taking blood thinners (like Coumadin/Warfarin)  Or if you are allergic to IV iodine contrast (dye). We must know if you could possible be pregnant.  Possible side-effects:  Bleeding from needle site  Infection (rare, may require surgery)  Nerve injury (rare)  Numbness & tingling (temporary)  Difficulty urinating (rare, temporary)  Spinal headache ( a headache worse with upright posture)  Light -headedness (temporary)  Pain at injection site (several days)  Decreased blood pressure (temporary)  Weakness in arm/leg (temporary)  Pressure sensation in back/neck (temporary)  Call if you experience:  Fever/chills associated with headache or increased back/neck pain.  Headache worsened by an upright position.  New onset weakness or numbness of an extremity below the injection site  Hives or difficulty breathing (go to the emergency  room)  Inflammation or drainage at the infection site  Severe back/neck pain  Any new symptoms which are concerning to you  Please note:  Although the local anesthetic injected can often make your back or neck feel good for several hours after the injection, the pain will likely return.  It takes 3-7 days for steroids to work in the epidural space.  You may not notice any pain relief for at least that one week.  If effective, we will often do a series of three injections spaced 3-6 weeks apart to maximally decrease your pain.  After the initial series, we generally will wait several months before considering a repeat injection of the same type.  If you have any questions, please call [phone] [facility]

## 2015-07-11 NOTE — Progress Notes (Signed)
Subjective:    Patient ID: [name], female    DOB: 1956/10/17, 59 y.o.   MRN: [id number]  HPI  Patient is 59 year old female who comes to pain management Center at the request of [name] for further evaluation and treatment of pain involving the lower back and lower extremity region especially the right lower extremity region. Patient states that her pain began years ago and has become progressively more severe and incapacitating. Patient is with history of neck fusion performed by Dr. Rolena Garner of [facility] orthopedics. She states that her pain is severely incapacitating in terms of lower back and lower extremity pain and described her pain as agonizing constant sharp stabbing tender throbbing sensation awakening patient from sleep. Patient stated the pain increases with motion and walking. Patient stated that her pain decreased with sitting. We will review the MRI findings on today's visit and decision has been made to proceed with lumbar epidural steroid injection at time of return appointment. We also explained to patient that patient may benefit from lumbar facet, medial branch nerve, blocks as well as other procedures. We also did discuss medication adjustments which we will consider the patient was understanding and in agreement status treatment plan. At this time we will proceed with lumbar epidural steroid injection at time return appointment. The patient was with understanding and in agreement with suggested treatment plan.   Review of Systems    cardiovascular High blood pressure Daily aspirin intake  Pulmonary Positive cigarette smoking  Neurological Unremarkable  Psychological Anxiety Depression Panic attacks  Gastrointestinal Gastroesophageal reflux disease  Genitourinary Unremarkable  Hematological Unremarkable  Endocrine Unremarkable  Rheumatological Unremarkable  Musculoskeletal Unremarkable  Other significant Unremarkable   Objective:    Physical Exam  There was tenderness of the splenius capitis and occipitalis musculature region of mild degree. There was mild tenderness of the region of the cervical facet cervical paraspinal muscular region as well as the thoracic facet and thoracic paraspinal musculature region. Patient appeared to be with slightly decreased grip strength. Tinel and Phalen's maneuver were without increase of pain significant degree. There was unremarkable Spurling's maneuver. Palpation over the thoracic facet thoracic paraspinal musculature region was a tends to palpation in the lower thoracic region of moderate degree. There was tenderness over the lumbar facet lumbar paraspinal musculature of moderate degree. Palpation over the left lumbar paraspinal muscles region was moderate degree palpation over the right lumbar paraspinal muscles region was moderately severe degree. Straight leg raising was tolerates approximately 20 without a definite increase of pain with dorsiflexion noted. There was negative clonus and negative Homans. Lateral bending rotation and extension and palpation of the lumbar facets reproduce moderately severe discomfort. There was moderate tennis of the PSIS and PII S regions. Abdomen was nontender and no costovertebral angle tenderness was noted.    Assessment & Plan:    Degenerative disc disease of the lumbar spine  Most predominant at L4-5. Multilevel facet arthropathy at L2-3, L3-4, L4-5, with disc space narrowing as well Disc desiccation at T12-L1 to L4-L5  Lumbar facet syndrome  Lumbar radiculopathy  Lumbar stenosis    Plan  Continue present medications  Lumbar epidural steroid injection to be performed at time return appointment  F/U PCP [name] for evaliation of  BP and general medical  condition  F/U surgical evaluation. We will have discussed surgical evaluation and will consider  F/U neurological evaluation. We will consider PNCV EMG studies and other  studies  May consider radiofrequency rhizolysis or intraspinal  procedures pending response to present treatment and F/U evaluation   Patient to call Pain Management Center should patient have concerns prior to scheduled return appointmen.

## 2015-07-11 NOTE — Progress Notes (Signed)
Safety precautions to be maintained throughout the outpatient stay will include: orient to surroundings, keep bed in low position, maintain call bell within reach at all times, provide assistance with transfer out of bed and ambulation.  

## 2015-07-17 ENCOUNTER — Encounter: Payer: Self-pay | Admitting: Pain Medicine

## 2015-07-17 ENCOUNTER — Ambulatory Visit: Payer: BLUE CROSS/BLUE SHIELD | Attending: Pain Medicine | Admitting: Pain Medicine

## 2015-07-17 VITALS — BP 113/43 | HR 78 | Temp 98.6°F | Resp 16 | Ht 63.0 in | Wt 260.0 lb

## 2015-07-17 DIAGNOSIS — M1288 Other specific arthropathies, not elsewhere classified, other specified site: Secondary | ICD-10-CM | POA: Insufficient documentation

## 2015-07-17 DIAGNOSIS — M79604 Pain in right leg: Secondary | ICD-10-CM | POA: Diagnosis present

## 2015-07-17 DIAGNOSIS — M48062 Spinal stenosis, lumbar region with neurogenic claudication: Secondary | ICD-10-CM

## 2015-07-17 DIAGNOSIS — M47816 Spondylosis without myelopathy or radiculopathy, lumbar region: Secondary | ICD-10-CM

## 2015-07-17 DIAGNOSIS — M170 Bilateral primary osteoarthritis of knee: Secondary | ICD-10-CM

## 2015-07-17 DIAGNOSIS — M545 Low back pain: Secondary | ICD-10-CM | POA: Diagnosis present

## 2015-07-17 DIAGNOSIS — M79605 Pain in left leg: Secondary | ICD-10-CM | POA: Diagnosis present

## 2015-07-17 DIAGNOSIS — M5126 Other intervertebral disc displacement, lumbar region: Secondary | ICD-10-CM | POA: Insufficient documentation

## 2015-07-17 MED ORDER — TRIAMCINOLONE ACETONIDE 40 MG/ML IJ SUSP
INTRAMUSCULAR | Status: AC
  Administered 2015-07-17: 12:00:00
  Filled 2015-07-17: qty 1

## 2015-07-17 MED ORDER — ORPHENADRINE CITRATE 30 MG/ML IJ SOLN
INTRAMUSCULAR | Status: AC
  Administered 2015-07-17: 12:00:00
  Filled 2015-07-17: qty 2

## 2015-07-17 MED ORDER — CEFAZOLIN SODIUM 1 G IJ SOLR
INTRAMUSCULAR | Status: AC
  Administered 2015-07-17: 1 g via INTRAVENOUS
  Filled 2015-07-17: qty 10

## 2015-07-17 MED ORDER — CEFUROXIME AXETIL 250 MG PO TABS: 250.0000 mg | ORAL_TABLET | Freq: Two times a day (BID) | ORAL | Status: DC

## 2015-07-17 MED ORDER — BUPIVACAINE HCL (PF) 0.25 % IJ SOLN
INTRAMUSCULAR | Status: AC
  Administered 2015-07-17: 12:00:00
  Filled 2015-07-17: qty 30

## 2015-07-17 MED ORDER — LIDOCAINE HCL (PF) 1 % IJ SOLN
INTRAMUSCULAR | Status: AC
  Administered 2015-07-17: 12:00:00
  Filled 2015-07-17: qty 5

## 2015-07-17 MED ORDER — FENTANYL CITRATE (PF) 100 MCG/2ML IJ SOLN
INTRAMUSCULAR | Status: AC
  Administered 2015-07-17: 100 ug via INTRAVENOUS
  Filled 2015-07-17: qty 2

## 2015-07-17 MED ORDER — MIDAZOLAM HCL 5 MG/5ML IJ SOLN
INTRAMUSCULAR | Status: AC
  Administered 2015-07-17: 2 mg via INTRAVENOUS
  Filled 2015-07-17: qty 5

## 2015-07-17 NOTE — Progress Notes (Signed)
   Subjective:    Patient ID: [name], female    DOB: 05-Sep-1956, 59 y.o.   MRN: [id number]  HPI  PROCEDURE PERFORMED: Lumbar epidural steroid injection   NOTE: The patient is a 59 y.o. female who returns to [facility] for further evaluation and treatment of pain involving the lumbar and lower extremity region.  MRI revealed the patient to be with degenerative disc disease of the lumbar spine most predominant at L4-5. 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 The risks, benefits, and expectations of the procedure have been discussed and explained to the patient who was understanding and in agreement with suggested treatment plan. We will proceed with lumbar epidural steroid injection as discussed and as explained to the patient who is willing to proceed with procedure as planned.   DESCRIPTION OF PROCEDURE: Lumbar epidural steroid injection with IV Versed, IV fentanyl conscious sedation, EKG, blood pressure, pulse, and pulse oximetry monitoring. The procedure was performed with the patient in the prone position under fluoroscopic guidance. A local anesthetic skin wheal of 1.5% plain lidocaine was accomplished at proposed entry site. An 18-gauge Tuohy epidural needle was inserted at the L  4 vertebral body level  right of the midline via loss-of-resistance technique with negative heme and negative CSF return. A total of 4 mL of Preservative-Free normal saline with 40 mg of Kenalog injected incrementally via epidurally placed needle. Needle was removed.    A total of 40 mg of Kenalog was utilized for the procedure.   The patient tolerated the injection well.    PLAN:   1. Medications: We will continue presently prescribed medications. 2. Will consider modification of treatment regimen pending response to treatment rendered on today's visit and follow-up evaluation. 3. The patient is to follow-up  with primary care physician Dr. [name] regarding blood pressure and general medical condition status post lumbar epidural steroid injection performed on today's visit. 4. Surgical evaluation has been addressed. 5. Neurological evaluation with  PNCV EMG studies to be considered. 6. The patient may be a candidate for radiofrequency procedures, implantation device, and other treatment pending response to treatment and follow-up evaluation. 7. The patient has been advised to adhere to proper body mechanics and avoid activities which appear to aggravate condition. 8. The patient has been advised to call the Pain Management Center prior to scheduled return appointment should there be significant change in condition or should there be sign  The patient is understanding and agrees with the suggested  treatment plan     Review of Systems     Objective:   Physical Exam        Assessment & Plan:

## 2015-07-17 NOTE — Patient Instructions (Addendum)
Continue present medications and begin taking antibiotic Ceftin Ceftin as prescribed. Please obtain your antibiotic today and begin taking antibiotic today  F/U PCP Dr. [name] for evaliation of  BP and general medical  condition.  F/U surgical evaluation as discussed  F/U neurological evaluation.  May consider radiofrequency rhizolysis or intraspinal procedures pending response to present treatment and F/U evaluation.  Patient to call [facility] should patient have concerns prior to scheduled return appointment.  Pain Management Discharge Instructions  General Discharge Instructions :  If you need to reach your doctor call: Monday-Friday 8:00 am - 4:00 pm at [phone] or toll free [phone].  After clinic hours [phone] to have operator reach doctor.  Bring all of your medication bottles to all your appointments in the pain clinic.  To cancel or reschedule your appointment with Pain Management please remember to call 24 hours in advance to avoid a fee.  Refer to the educational materials which you have been given on: General Risks, I had my Procedure. Discharge Instructions, Post Sedation.  Post Procedure Instructions:  The drugs you were given will stay in your system until tomorrow, so for the next 24 hours you should not drive, make any legal decisions or drink any alcoholic beverages.  You may eat anything you prefer, but it is better to start with liquids then soups and crackers, and gradually work up to solid foods.  Please notify your doctor immediately if you have any unusual bleeding, trouble breathing or pain that is not related to your normal pain.  Depending on the type of procedure that was done, some parts of your body may feel week and/or numb.  This usually clears up by tonight or the next day.  Walk with the use of an assistive device or accompanied by an adult for the 24 hours.  You may use ice on the affected area for the first 24 hours.   Put ice in a Ziploc bag and cover with a towel and place against area 15 minutes on 15 minutes off.  You may switch to heat after 24 hours.GENERAL RISKS AND COMPLICATIONS  What are the risk, side effects and possible complications? Generally speaking, most procedures are safe.  However, with any procedure there are risks, side effects, and the possibility of complications.  The risks and complications are dependent upon the sites that are lesioned, or the type of nerve block to be performed.  The closer the procedure is to the spine, the more serious the risks are.  Great care is taken when placing the radio frequency needles, block needles or lesioning probes, but sometimes complications can occur. 1. Infection: Any time there is an injection through the skin, there is a risk of infection.  This is why sterile conditions are used for these blocks.  There are four possible types of infection. 1. Localized skin infection. 2. Central Nervous System Infection-This can be in the form of Meningitis, which can be deadly. 3. Epidural Infections-This can be in the form of an epidural abscess, which can cause pressure inside of the spine, causing compression of the spinal cord with subsequent paralysis. This would require an emergency surgery to decompress, and there are no guarantees that the patient would recover from the paralysis. 4. Discitis-This is an infection of the intervertebral discs.  It occurs in about 1% of discography procedures.  It is difficult to treat and it may lead to surgery.        2. Pain: the needles have to  go through skin and soft tissues, will cause soreness.       3. Damage to internal structures:  The nerves to be lesioned may be near blood vessels or    other nerves which can be potentially damaged.       4. Bleeding: Bleeding is more common if the patient is taking blood thinners such as  aspirin, Coumadin, Ticiid, Plavix, etc., or if he/she have some genetic predisposition  such  as hemophilia. Bleeding into the spinal canal can cause compression of the spinal  cord with subsequent paralysis.  This would require an emergency surgery to  decompress and there are no guarantees that the patient would recover from the  paralysis.       5. Pneumothorax:  Puncturing of a lung is a possibility, every time a needle is introduced in  the area of the chest or upper back.  Pneumothorax refers to free air around the  collapsed lung(s), inside of the thoracic cavity (chest cavity).  Another two possible  complications related to a similar event would include: Hemothorax and Chylothorax.   These are variations of the Pneumothorax, where instead of air around the collapsed  lung(s), you may have blood or chyle, respectively.       6. Spinal headaches: They may occur with any procedures in the area of the spine.       7. Persistent CSF (Cerebro-Spinal Fluid) leakage: This is a rare problem, but may occur  with prolonged intrathecal or epidural catheters either due to the formation of a fistulous  track or a dural tear.       8. Nerve damage: By working so close to the spinal cord, there is always a possibility of  nerve damage, which could be as serious as a permanent spinal cord injury with  paralysis.       9. Death:  Although rare, severe deadly allergic reactions known as "Anaphylactic  reaction" can occur to any of the medications used.      10. Worsening of the symptoms:  We can always make thing worse.  What are the chances of something like this happening? Chances of any of this occuring are extremely low.  By statistics, you have more of a chance of getting killed in a motor vehicle accident: while driving to the hospital than any of the above occurring .  Nevertheless, you should be aware that they are possibilities.  In general, it is similar to taking a shower.  Everybody knows that you can slip, hit your head and get killed.  Does that mean that you should not shower again?   Nevertheless always keep in mind that statistics do not mean anything if you happen to be on the wrong side of them.  Even if a procedure has a 1 (one) in a 1,000,000 (million) chance of going wrong, it you happen to be that one..Also, keep in mind that by statistics, you have more of a chance of having something go wrong when taking medications.  Who should not have this procedure? If you are on a blood thinning medication (e.g. Coumadin, Plavix, see list of "Blood Thinners"), or if you have an active infection going on, you should not have the procedure.  If you are taking any blood thinners, please inform your physician.  How should I prepare for this procedure?  Do not eat or drink anything at least six hours prior to the procedure.  Bring a driver with you .  It cannot  be a taxi.  Come accompanied by an adult that can drive you back, and that is strong enough to help you if your legs get weak or numb from the local anesthetic.  Take all of your medicines the morning of the procedure with just enough water to swallow them.  If you have diabetes, make sure that you are scheduled to have your procedure done first thing in the morning, whenever possible.  If you have diabetes, take only half of your insulin dose and notify our nurse that you have done so as soon as you arrive at the clinic.  If you are diabetic, but only take blood sugar pills (oral hypoglycemic), then do not take them on the morning of your procedure.  You may take them after you have had the procedure.  Do not take aspirin or any aspirin-containing medications, at least eleven (11) days prior to the procedure.  They may prolong bleeding.  Wear loose fitting clothing that may be easy to take off and that you would not mind if it got stained with Betadine or blood.  Do not wear any jewelry or perfume  Remove any nail coloring.  It will interfere with some of our monitoring equipment.  NOTE: Remember that this is not  meant to be interpreted as a complete list of all possible complications.  Unforeseen problems may occur.  BLOOD THINNERS The following drugs contain aspirin or other products, which can cause increased bleeding during surgery and should not be taken for 2 weeks prior to and 1 week after surgery.  If you should need take something for relief of minor pain, you may take acetaminophen which is found in Tylenol,m Datril, Anacin-3 and Panadol. It is not blood thinner. The products listed below are.  Do not take any of the products listed below in addition to any listed on your instruction sheet.  A.P.C or A.P.C with Codeine Codeine Phosphate Capsules #3 Ibuprofen Ridaura  ABC compound Congesprin Imuran rimadil  Advil Cope Indocin Robaxisal  Alka-Seltzer Effervescent Pain Reliever and Antacid Coricidin or Coricidin-D  Indomethacin Rufen  Alka-Seltzer plus Cold Medicine Cosprin Ketoprofen S-A-C Tablets  Anacin Analgesic Tablets or Capsules Coumadin Korlgesic Salflex  Anacin Extra Strength Analgesic tablets or capsules CP-2 Tablets Lanoril Salicylate  Anaprox Cuprimine Capsules Levenox Salocol  Anexsia-D Dalteparin Magan Salsalate  Anodynos Darvon compound Magnesium Salicylate Sine-off  Ansaid Dasin Capsules Magsal Sodium Salicylate  Anturane Depen Capsules Marnal Soma  APF Arthritis pain formula Dewitt's Pills Measurin Stanback  Argesic Dia-Gesic Meclofenamic Sulfinpyrazone  Arthritis Bayer Timed Release Aspirin Diclofenac Meclomen Sulindac  Arthritis pain formula Anacin Dicumarol Medipren Supac  Analgesic (Safety coated) Arthralgen Diffunasal Mefanamic Suprofen  Arthritis Strength Bufferin Dihydrocodeine Mepro Compound Suprol  Arthropan liquid Dopirydamole Methcarbomol with Aspirin Synalgos  ASA tablets/Enseals Disalcid Micrainin Tagament  Ascriptin Doan's Midol Talwin  Ascriptin A/D Dolene Mobidin Tanderil  Ascriptin Extra Strength Dolobid Moblgesic Ticlid  Ascriptin with Codeine Doloprin or  Doloprin with Codeine Momentum Tolectin  Asperbuf Duoprin Mono-gesic Trendar  Aspergum Duradyne Motrin or Motrin IB Triminicin  Aspirin plain, buffered or enteric coated Durasal Myochrisine Trigesic  Aspirin Suppositories Easprin Nalfon Trillsate  Aspirin with Codeine Ecotrin Regular or Extra Strength Naprosyn Uracel  Atromid-S Efficin Naproxen Ursinus  Auranofin Capsules Elmiron Neocylate Vanquish  Axotal Emagrin Norgesic Verin  Azathioprine Empirin or Empirin with Codeine Normiflo Vitamin E  Azolid Emprazil Nuprin Voltaren  Bayer Aspirin plain, buffered or children's or timed BC Tablets or powders Encaprin Orgaran Warfarin Sodium  Buff-a-Comp  Enoxaparin Orudis Zorpin  Buff-a-Comp with Codeine Equegesic Os-Cal-Gesic   Buffaprin Excedrin plain, buffered or Extra Strength Oxalid   Bufferin Arthritis Strength Feldene Oxphenbutazone   Bufferin plain or Extra Strength Feldene Capsules Oxycodone with Aspirin   Bufferin with Codeine Fenoprofen Fenoprofen Pabalate or Pabalate-SF   Buffets II Flogesic Panagesic   Buffinol plain or Extra Strength Florinal or Florinal with Codeine Panwarfarin   Buf-Tabs Flurbiprofen Penicillamine   Butalbital Compound Four-way cold tablets Penicillin   Butazolidin Fragmin Pepto-Bismol   Carbenicillin Geminisyn Percodan   Carna Arthritis Reliever Geopen Persantine   Carprofen Gold's salt Persistin   Chloramphenicol Goody's Phenylbutazone   Chloromycetin Haltrain Piroxlcam   Clmetidine heparin Plaquenil   Cllnoril Hyco-pap Ponstel   Clofibrate Hydroxy chloroquine Propoxyphen         Before stopping any of these medications, be sure to consult the physician who ordered them.  Some, such as Coumadin (Warfarin) are ordered to prevent or treat serious conditions such as "deep thrombosis", "pumonary embolisms", and other heart problems.  The amount of time that you may need off of the medication may also vary with the medication and the reason for which you were  taking it.  If you are taking any of these medications, please make sure you notify your pain physician before you undergo any procedures.

## 2015-07-17 NOTE — Progress Notes (Signed)
Safety precautions to be maintained throughout the outpatient stay will include: orient to surroundings, keep bed in low position, maintain call bell within reach at all times, provide assistance with transfer out of bed and ambulation.  

## 2015-07-18 ENCOUNTER — Telehealth: Payer: Self-pay | Admitting: *Deleted

## 2015-07-18 NOTE — Telephone Encounter (Signed)
No problems post procedure. 

## 2015-07-23 ENCOUNTER — Other Ambulatory Visit: Payer: Self-pay | Admitting: Pain Medicine

## 2015-07-26 ENCOUNTER — Ambulatory Visit (INDEPENDENT_AMBULATORY_CARE_PROVIDER_SITE_OTHER): Payer: BLUE CROSS/BLUE SHIELD | Admitting: Unknown Physician Specialty

## 2015-07-26 ENCOUNTER — Encounter: Payer: Self-pay | Admitting: Unknown Physician Specialty

## 2015-07-26 VITALS — BP 152/76 | HR 98 | Temp 99.0°F | Ht 63.5 in | Wt 264.1 lb

## 2015-07-26 DIAGNOSIS — Z0000 Encounter for general adult medical examination without abnormal findings: Secondary | ICD-10-CM

## 2015-07-26 DIAGNOSIS — Z720 Tobacco use: Secondary | ICD-10-CM

## 2015-07-26 DIAGNOSIS — N189 Chronic kidney disease, unspecified: Secondary | ICD-10-CM | POA: Diagnosis not present

## 2015-07-26 DIAGNOSIS — K2100 Gastro-esophageal reflux disease with esophagitis, without bleeding: Secondary | ICD-10-CM

## 2015-07-26 DIAGNOSIS — F410 Panic disorder [episodic paroxysmal anxiety] without agoraphobia: Secondary | ICD-10-CM | POA: Diagnosis not present

## 2015-07-26 DIAGNOSIS — I129 Hypertensive chronic kidney disease with stage 1 through stage 4 chronic kidney disease, or unspecified chronic kidney disease: Secondary | ICD-10-CM | POA: Diagnosis not present

## 2015-07-26 DIAGNOSIS — M5136 Other intervertebral disc degeneration, lumbar region: Secondary | ICD-10-CM | POA: Diagnosis not present

## 2015-07-26 DIAGNOSIS — E669 Obesity, unspecified: Secondary | ICD-10-CM

## 2015-07-26 DIAGNOSIS — K439 Ventral hernia without obstruction or gangrene: Secondary | ICD-10-CM

## 2015-07-26 DIAGNOSIS — F17210 Nicotine dependence, cigarettes, uncomplicated: Secondary | ICD-10-CM | POA: Insufficient documentation

## 2015-07-26 DIAGNOSIS — E785 Hyperlipidemia, unspecified: Secondary | ICD-10-CM

## 2015-07-26 LAB — MICROALBUMIN, URINE WAIVED
Creatinine, Urine Waived: 50 mg/dL (ref 10–300)
Microalb, Ur Waived: 10 mg/L (ref 0–19)
Microalb/Creat Ratio: <30 mg/g (ref <30)

## 2015-07-26 MED ORDER — GABAPENTIN 800 MG PO TABS: 800.0000 mg | ORAL_TABLET | Freq: Three times a day (TID) | ORAL | Status: DC

## 2015-07-26 MED ORDER — DULOXETINE HCL 60 MG PO CPEP: 60.0000 mg | ORAL_CAPSULE | Freq: Every day | ORAL | Status: DC

## 2015-07-26 MED ORDER — OMEPRAZOLE 40 MG PO CPDR: 40.0000 mg | DELAYED_RELEASE_CAPSULE | Freq: Every day | ORAL | Status: DC

## 2015-07-26 MED ORDER — LOVASTATIN 40 MG PO TABS: 40.0000 mg | ORAL_TABLET | Freq: Every day | ORAL | Status: DC

## 2015-07-26 MED ORDER — CLONAZEPAM 1 MG PO TABS: 1.0000 mg | ORAL_TABLET | Freq: Two times a day (BID) | ORAL | Status: DC

## 2015-07-26 NOTE — Assessment & Plan Note (Signed)
Per Dr. Josefa Half

## 2015-07-26 NOTE — Assessment & Plan Note (Signed)
Pt ed on quitting smoking

## 2015-07-26 NOTE — Assessment & Plan Note (Signed)
Refill Omeprazole 

## 2015-07-26 NOTE — Assessment & Plan Note (Signed)
Discussed diet  

## 2015-07-26 NOTE — Assessment & Plan Note (Signed)
Refill Clonazepam

## 2015-07-26 NOTE — Assessment & Plan Note (Signed)
High today.  Start checking at home.

## 2015-07-26 NOTE — Assessment & Plan Note (Signed)
Check Lipid panel 

## 2015-07-26 NOTE — Progress Notes (Signed)
BP 152/76 mmHg  Pulse 98  Temp(Src) 99 F (37.2 C)  Ht 5' 3.5" (1.613 m)  Wt 264 lb 1.9 oz (119.804 kg)  BMI 46.05 kg/m2  SpO2 97%  LMP  (LMP Unknown)   Subjective:    Patient ID: [name], female    DOB: 30-Jul-1956, 59 y.o.   MRN: [id number]  HPI: [name] is a 59 y.o. female  Chief Complaint  Patient presents with  . Annual Exam   Low back pain "I'm doing better."  She is seeing Dr. Josefa Half who will take over her medications in one month.  He gave her an epidural which helped quite a bit.  Taking both Gabapentin and Duloxetine.    Panic attacks States she has a history of bad panic attacks.  She talks herself out of them, but has not gotten counseling for them.  Takes Clonazepam 1 mg BID cut back from TID at my recommendation.    Hypercholesterol Taking Lovastatin without side-effects.    Hypertension Stable but a little high here.  No chest pain or SOB  GERD Well controlled with GERD  Obesity Put on a lot of weight since she had a bad back.    Relevant past medical, surgical, family and social history reviewed and updated as indicated. Interim medical history since our last visit reviewed. Allergies and medications reviewed and updated.  Review of Systems  Constitutional: Negative.   HENT: Negative.   Eyes: Negative.   Respiratory: Negative.   Cardiovascular: Negative.   Gastrointestinal:       Noted abdominal pain with bulging when she coughs  Endocrine: Negative.   Genitourinary: Negative.   Musculoskeletal:       Walks with walker due to leg giving out.  Walks with a cane inside the house.    Skin: Negative.   Allergic/Immunologic: Negative.   Neurological: Negative.   Hematological: Negative.   Psychiatric/Behavioral: The patient is nervous/anxious.     Per HPI unless specifically indicated above     Objective:    BP 152/76 mmHg  Pulse 98  Temp(Src) 99 F (37.2 C)  Ht 5' 3.5" (1.613 m)  Wt 264 lb 1.9 oz (119.804 kg)  BMI  46.05 kg/m2  SpO2 97%  LMP  (LMP Unknown)  Wt Readings from Last 3 Encounters:  07/26/15 264 lb 1.9 oz (119.804 kg)  07/17/15 260 lb (117.935 kg)  07/11/15 260 lb (117.935 kg)    Physical Exam  Constitutional: She is oriented to person, place, and time. She appears well-developed and well-nourished. No distress.  HENT:  Head: Normocephalic and atraumatic.  Eyes: Pupils are equal, round, and reactive to light. Right eye exhibits no discharge. Left eye exhibits no discharge. No scleral icterus.  Neck: Normal range of motion. Neck supple. Carotid bruit is not present. No thyromegaly present.  Cardiovascular: Normal rate, regular rhythm and normal heart sounds.  Exam reveals no gallop and no friction rub.   No murmur heard. Pulmonary/Chest: Effort normal and breath sounds normal. No respiratory distress. She has no wheezes. She has no rales.  Abdominal: Soft. Bowel sounds are normal. There is no tenderness. There is no rebound. A hernia is present. Hernia confirmed positive in the ventral area.  Noted large bulging lower center abdoman with straining  Genitourinary: No breast swelling, tenderness or discharge.  Musculoskeletal: Normal range of motion.  Lymphadenopathy:    She has no cervical adenopathy.  Neurological: She is alert and oriented to person, place, and time.  Skin: Skin is warm, dry and intact. No rash noted. She is not diaphoretic.  Psychiatric: She has a normal mood and affect. Her speech is normal and behavior is normal. Judgment and thought content normal. Cognition and memory are normal.    Assessment & Plan:   Problem List Items Addressed This Visit      Unprioritized   Benign hypertension with chronic kidney disease    High today.  Start checking at home.       Relevant Medications   lovastatin (MEVACOR) 40 MG tablet   Other Relevant Orders   Comprehensive metabolic panel   Microalbumin, Urine Waived   Uric acid   Panic disorder    Refill Clonazepam       Relevant Medications   DULoxetine (CYMBALTA) 60 MG capsule   DDD (degenerative disc disease), lumbar    Per Dr. Josefa Half      Hyperlipidemia    Check Lipid panel      Relevant Medications   lovastatin (MEVACOR) 40 MG tablet   Other Relevant Orders   Lipid Panel w/o Chol/HDL Ratio   Gastroesophageal reflux disease    Refill Omeprazole      Relevant Medications   omeprazole (PRILOSEC) 40 MG capsule   Obesity    Discussed diet      Tobacco abuse    Pt ed on quitting smoking       Other Visit Diagnoses    Annual physical exam    -  Primary    Relevant Orders    Hepatitis C antibody    HIV antibody    MM DIGITAL SCREENING BILATERAL    Hernia of abdominal wall        Refer to surgery    Relevant Orders    Ambulatory referral to General Surgery        Follow up plan: Return in about 3 months (around 10/26/2015).

## 2015-07-27 LAB — COMPREHENSIVE METABOLIC PANEL
ALT: 23 IU/L (ref 0–32)
AST: 18 IU/L (ref 0–40)
Albumin/Globulin Ratio: 1.4 (ref 1.1–2.5)
Albumin: 3.8 g/dL (ref 3.5–5.5)
Alkaline Phosphatase: 90 IU/L (ref 39–117)
BUN/Creatinine Ratio: 15 (ref 9–23)
BUN: 13 mg/dL (ref 6–24)
Bilirubin Total: <0.2 mg/dL (ref 0.0–1.2)
CO2: 27 mmol/L (ref 18–29)
Calcium: 9.4 mg/dL (ref 8.7–10.2)
Chloride: 101 mmol/L (ref 97–108)
Creatinine, Ser: 0.88 mg/dL (ref 0.57–1.00)
GFR calc Af Amer: 83 mL/min/{1.73_m2} (ref >59)
GFR calc non Af Amer: 72 mL/min/{1.73_m2} (ref >59)
Globulin, Total: 2.7 g/dL (ref 1.5–4.5)
Glucose: 91 mg/dL (ref 65–99)
Potassium: 4.5 mmol/L (ref 3.5–5.2)
Sodium: 143 mmol/L (ref 134–144)
Total Protein: 6.5 g/dL (ref 6.0–8.5)

## 2015-07-27 LAB — LIPID PANEL W/O CHOL/HDL RATIO
Cholesterol, Total: 194 mg/dL (ref 100–199)
HDL: 42 mg/dL (ref >39)
LDL Calculated: 114 mg/dL — ABNORMAL HIGH (ref 0–99)
Triglycerides: 189 mg/dL — ABNORMAL HIGH (ref 0–149)
VLDL Cholesterol Cal: 38 mg/dL (ref 5–40)

## 2015-07-27 LAB — URIC ACID: Uric Acid: 7.3 mg/dL — ABNORMAL HIGH (ref 2.5–7.1)

## 2015-07-27 LAB — HEPATITIS C ANTIBODY: Hep C Virus Ab: 0.1 s/co ratio (ref 0.0–0.9)

## 2015-07-27 LAB — HIV ANTIBODY (ROUTINE TESTING W REFLEX): HIV Screen 4th Generation wRfx: NONREACTIVE

## 2015-07-29 ENCOUNTER — Telehealth: Payer: Self-pay | Admitting: Surgery

## 2015-07-29 ENCOUNTER — Telehealth: Payer: Self-pay | Admitting: Unknown Physician Specialty

## 2015-07-29 NOTE — Telephone Encounter (Signed)
Pt called needs refill on Klonopin. [facility] [place]. Thanks.

## 2015-07-29 NOTE — Telephone Encounter (Signed)
Called and patient stated she did not pick up rx so I called in it to the pharmacy.

## 2015-07-29 NOTE — Telephone Encounter (Signed)
I have called pt to make an appointment per referral. No answer. I have left a detailed msg on VM.

## 2015-07-29 NOTE — Telephone Encounter (Signed)
Routing to provider. It looks like the refill for this was printed Friday but I do not see it up front. Do you want to re-print or call in?

## 2015-07-29 NOTE — Telephone Encounter (Signed)
Please call it in.  But make sure she didn't pick it up on Friday

## 2015-08-01 ENCOUNTER — Encounter: Payer: Self-pay | Admitting: Surgery

## 2015-08-01 ENCOUNTER — Ambulatory Visit (INDEPENDENT_AMBULATORY_CARE_PROVIDER_SITE_OTHER): Payer: BLUE CROSS/BLUE SHIELD | Admitting: Surgery

## 2015-08-01 VITALS — BP 155/71 | HR 90 | Temp 98.4°F | Ht 63.0 in | Wt 264.0 lb

## 2015-08-01 DIAGNOSIS — K439 Ventral hernia without obstruction or gangrene: Secondary | ICD-10-CM | POA: Diagnosis not present

## 2015-08-01 NOTE — Progress Notes (Signed)
Surgical Consultation  08/01/2015  [name] is an 59 y.o. female.   Chief Complaint  Patient presents with  . Hernia    ventral hernia surgery consult     HPI: She is referred for evaluation of a large epigastric abdominal wall hernia. It has been present for several months but has become increasing symptomatic over the last several weeks. She does not have any obstructive symptoms. She does have pain in the abdominal wall which is increased when she coughs. She's not had any further imaging of this particular area.  She denies any evidence of abdominal wall surgery in the past. She's not had anything other than appendectomy and hysterectomy. She's never had any umbilical procedures. She denies any other major GI problems.  She's gained significant amount of weight recently because of her ongoing back problems. She has significant back issues and has undergone steroid injections which have helped. She's also smoking a pack of cigarettes a day and has significant chronic obstructive lung disease.  Past Medical History  Diagnosis Date  . Arthritis   . COPD (chronic obstructive pulmonary disease)   . GERD (gastroesophageal reflux disease)   . Hypertension   . Hyperlipidemia   . Osteoporosis   . Sleep apnea   . Enlarged heart   . Hx of degenerative disc disease   . Mass     On adrenal gland  . Hiatal hernia     Past Surgical History  Procedure Laterality Date  . Appendectomy    . Tonsillectomy    . Abdominal hysterectomy    . Bone fusion in neck    . Achilles tendon repair      Removed bone spur and repaired achilles tendon    Family History  Problem Relation Age of Onset  . COPD Father   . Heart disease Father   . Hyperlipidemia Father   . Hypertension Father   . Dementia Father   . Cancer Sister   . Hyperlipidemia Sister   . Hypertension Sister   . Lymphoma Sister   . Migraines Son   . Stroke Maternal Grandfather   . Heart attack Brother     Social  History:  reports that she has been smoking Cigarettes.  She has been smoking about 1.00 pack per day. She has never used smokeless tobacco. She reports that she does not drink alcohol or use illicit drugs.  Allergies: No Known Allergies  Medications reviewed.     Review of Systems  Constitutional: Negative for fever and chills.       Significant weight gain  Eyes: Negative.   Respiratory: Positive for cough and shortness of breath. Negative for wheezing.   Cardiovascular: Negative.   Gastrointestinal: Positive for heartburn, nausea and abdominal pain.  Genitourinary: Negative.   Musculoskeletal: Positive for back pain, joint pain and neck pain.  Skin: Negative.   Neurological: Negative.   Psychiatric/Behavioral: Positive for depression.       BP 155/71 mmHg  Pulse 90  Temp(Src) 98.4 F (36.9 C) (Oral)  Ht 5\' 3"  (1.6 m)  Wt 264 lb (119.75 kg)  BMI 46.78 kg/m2  LMP  (LMP Unknown)  Physical Exam  Constitutional:  Moderately obese in no acute distress  HENT:  Head: Normocephalic and atraumatic.  Eyes: EOM are normal. Pupils are equal, round, and reactive to light.  Neck: Normal range of motion. Neck supple.  Cardiovascular: Regular rhythm and normal heart sounds.   Pulmonary/Chest: Effort normal and breath sounds normal. No respiratory distress.  She has no wheezes.  Abdominal: Soft. Bowel sounds are normal.  She has a large mass the midline which I suspect is an epigastric hernia. It is not reducible  Musculoskeletal: Normal range of motion. She exhibits no tenderness.  Neurological: She is alert.  Abnormal gait and requires use of a walker.  Skin: Skin is warm and dry.  Psychiatric: Affect and judgment normal.      No results found for this or any previous visit (from the past 48 hour(s)). No results found.  Assessment/Plan: 1. Ventral hernia without obstruction or gangrene I talked with her in detail about the situation. I do believe she has a large  epigastric hernia. However, I cannot palpate the fascial rings nor the edges of the defect. I cannot reduce the hernia contents.  She has gained significant amount of weight and she continues to smoke. She is a very poor surgical candidate under the circumstances. She does not have any evidence of obstruction. We will obtain a CT scan to see if we can demonstrate the size of this hernia but I do not see a simple surgical solution to this problem. She may not be a reasonable surgical candidate and I outlined that to her today. - CT Abdomen Pelvis W Contrast; Future   [name] dermatitis

## 2015-08-01 NOTE — Patient Instructions (Signed)
You will be contacted by radiology to schedule your CT of the Abdomin and Pelvis. We will contact you to schedule a follow up appointment once the CT scan is complete.

## 2015-08-08 ENCOUNTER — Telehealth: Payer: Self-pay

## 2015-08-08 NOTE — Telephone Encounter (Signed)
Tillie Rung called regarding patient at this time and would like for you to call her back. She would not tell me anything else but it looks like Dr. Pat Patrick saw patient on 9/1 and she is having a CT scan on 9/12 if that helps. [phone].

## 2015-08-12 ENCOUNTER — Ambulatory Visit
Admission: RE | Admit: 2015-08-12 | Discharge: 2015-08-12 | Disposition: A | Discharge status: Home / Self Care | Payer: BLUE CROSS/BLUE SHIELD | Source: Ambulatory Visit | Attending: Surgery | Admitting: Surgery

## 2015-08-12 DIAGNOSIS — D3501 Benign neoplasm of right adrenal gland: Secondary | ICD-10-CM | POA: Insufficient documentation

## 2015-08-12 DIAGNOSIS — K439 Ventral hernia without obstruction or gangrene: Secondary | ICD-10-CM

## 2015-08-12 DIAGNOSIS — M1611 Unilateral primary osteoarthritis, right hip: Secondary | ICD-10-CM | POA: Diagnosis not present

## 2015-08-12 MED ORDER — IOHEXOL 350 MG/ML SOLN
100.0000 mL | Freq: Once | INTRAVENOUS | Status: AC | PRN
  Administered 2015-08-12: 100 mL via INTRAVENOUS

## 2015-08-12 NOTE — Telephone Encounter (Signed)
I have called [name] back from the pre service department and authorization has been obtained for CT scan.

## 2015-08-14 ENCOUNTER — Ambulatory Visit (INDEPENDENT_AMBULATORY_CARE_PROVIDER_SITE_OTHER): Payer: BLUE CROSS/BLUE SHIELD | Admitting: Surgery

## 2015-08-14 ENCOUNTER — Encounter: Payer: Self-pay | Admitting: Surgery

## 2015-08-14 VITALS — BP 168/70 | HR 106 | Temp 97.2°F | Ht 63.0 in | Wt 265.0 lb

## 2015-08-14 DIAGNOSIS — K439 Ventral hernia without obstruction or gangrene: Secondary | ICD-10-CM

## 2015-08-14 NOTE — Patient Instructions (Signed)
Please call in 1 week and let us know how you are. If your diarrhea is no better and you are still having pain at your hernia site, we will discuss doing more testing at that time.

## 2015-08-14 NOTE — Progress Notes (Signed)
Outpatient Surgical Follow Up  08/14/2015  [name] is an 59 y.o. female.   Chief Complaint  Patient presents with  . Results    CT Scan Results    HPI: She returns following her CT scan to evaluate the ventral wall hernia. She has a fairly modest defect but does have a large hernia sac filled with preperitoneal or omental fat. There is no bowel in the lesion and no evidence of any obstruction. She continues to have discomfort. She is also having moderate diarrhea. There are no other complaints.  Past Medical History  Diagnosis Date  . Arthritis   . COPD (chronic obstructive pulmonary disease)   . GERD (gastroesophageal reflux disease)   . Hypertension   . Hyperlipidemia   . Osteoporosis   . Sleep apnea   . Enlarged heart   . Hx of degenerative disc disease   . Mass     On adrenal gland  . Hiatal hernia     Past Surgical History  Procedure Laterality Date  . Appendectomy    . Tonsillectomy    . Abdominal hysterectomy    . Bone fusion in neck    . Achilles tendon repair      Removed bone spur and repaired achilles tendon    Family History  Problem Relation Age of Onset  . COPD Father   . Heart disease Father   . Hyperlipidemia Father   . Hypertension Father   . Dementia Father   . Cancer Sister   . Hyperlipidemia Sister   . Hypertension Sister   . Lymphoma Sister   . Migraines Son   . Stroke Maternal Grandfather   . Heart attack Brother     Social History:  reports that she has been smoking Cigarettes.  She has been smoking about 1.00 pack per day. She has never used smokeless tobacco. She reports that she does not drink alcohol or use illicit drugs.  Allergies: No Known Allergies  Medications reviewed.    ROS review of systems is unchanged.    BP 168/70 mmHg  Pulse 106  Temp(Src) 97.2 F (36.2 C) (Oral)  Ht 5\' 3"  (1.6 m)  Wt 120.203 kg (265 lb)  BMI 46.95 kg/m2  LMP  (LMP Unknown)  Physical Exam her abdomen was not examined  today.     No results found for this or any previous visit (from the past 48 hour(s)). No results found.  Assessment/Plan:  1. Ventral hernia without obstruction or gangrene We spent more than 50% of this visit in consultation regarding her symptoms. At the present time her hernia small does not appear to be significantly symptomatic. She does not want to consider surgery at this point unless she develops more problem. We suggested she try over-the-counter medications for her diarrhea and we'll plan to see her back if she continues to have symptoms. She is in agreement.     [name]  08/14/2015,negative

## 2015-08-20 ENCOUNTER — Telehealth: Payer: Self-pay | Admitting: Unknown Physician Specialty

## 2015-08-20 ENCOUNTER — Encounter: Payer: Self-pay | Admitting: Pain Medicine

## 2015-08-20 ENCOUNTER — Telehealth: Payer: Self-pay | Admitting: Pain Medicine

## 2015-08-20 ENCOUNTER — Ambulatory Visit: Payer: BLUE CROSS/BLUE SHIELD | Attending: Pain Medicine | Admitting: Pain Medicine

## 2015-08-20 VITALS — BP 102/36 | HR 85 | Temp 98.7°F | Resp 18 | Ht 63.0 in | Wt 165.0 lb

## 2015-08-20 DIAGNOSIS — Z981 Arthrodesis status: Secondary | ICD-10-CM

## 2015-08-20 DIAGNOSIS — M47816 Spondylosis without myelopathy or radiculopathy, lumbar region: Secondary | ICD-10-CM

## 2015-08-20 DIAGNOSIS — M4806 Spinal stenosis, lumbar region: Secondary | ICD-10-CM | POA: Insufficient documentation

## 2015-08-20 DIAGNOSIS — M5126 Other intervertebral disc displacement, lumbar region: Secondary | ICD-10-CM | POA: Insufficient documentation

## 2015-08-20 DIAGNOSIS — M1288 Other specific arthropathies, not elsewhere classified, other specified site: Secondary | ICD-10-CM | POA: Insufficient documentation

## 2015-08-20 DIAGNOSIS — M48062 Spinal stenosis, lumbar region with neurogenic claudication: Secondary | ICD-10-CM

## 2015-08-20 DIAGNOSIS — M5416 Radiculopathy, lumbar region: Secondary | ICD-10-CM | POA: Diagnosis not present

## 2015-08-20 DIAGNOSIS — M5136 Other intervertebral disc degeneration, lumbar region: Secondary | ICD-10-CM | POA: Diagnosis not present

## 2015-08-20 DIAGNOSIS — M545 Low back pain: Secondary | ICD-10-CM | POA: Diagnosis present

## 2015-08-20 DIAGNOSIS — M170 Bilateral primary osteoarthritis of knee: Secondary | ICD-10-CM

## 2015-08-20 DIAGNOSIS — M79604 Pain in right leg: Secondary | ICD-10-CM | POA: Diagnosis present

## 2015-08-20 DIAGNOSIS — M533 Sacrococcygeal disorders, not elsewhere classified: Secondary | ICD-10-CM

## 2015-08-20 DIAGNOSIS — M79605 Pain in left leg: Secondary | ICD-10-CM | POA: Diagnosis present

## 2015-08-20 MED ORDER — CARISOPRODOL 350 MG PO TABS: ORAL_TABLET | ORAL | Status: DC

## 2015-08-20 MED ORDER — HYDROCODONE-ACETAMINOPHEN 10-325 MG PO TABS: ORAL_TABLET | ORAL | Status: DC

## 2015-08-20 NOTE — Telephone Encounter (Signed)
Spoke with pharmacy, medicaid is having problems with Dr. Ethel Rana NPI.

## 2015-08-20 NOTE — Telephone Encounter (Signed)
Cory from Dr. Ethel Rana office called and stated Dr. Primus Bravo would like [name]'s permission to prescribe pain medication. Please call Dr. Ethel Rana office ASAP. Thanks .

## 2015-08-20 NOTE — Telephone Encounter (Signed)
Says [facility] told her Medicaid will not cover Hydrocodone written by Dr. Primus Bravo / please call her

## 2015-08-20 NOTE — Telephone Encounter (Signed)
Dr. Ethel Rana other NPI given to pharmacy. They will try that one and call us back if there is a problem.

## 2015-08-20 NOTE — Telephone Encounter (Signed)
Routing to provider  

## 2015-08-20 NOTE — Progress Notes (Signed)
Safety precautions to be maintained throughout the outpatient stay will include: orient to surroundings, keep bed in low position, maintain call bell within reach at all times, provide assistance with transfer out of bed and ambulation.  

## 2015-08-20 NOTE — Patient Instructions (Addendum)
PLAN   Continue present medication. Began hydrocodone acetaminophen. This medication can cause drowsiness confusion and other side effects including respiratory depression. Be extremely careful when taking this medication and began by taking only 1 per day for the first 3 days( No Tylenol and no ibuprofen )  I prescribe Soma today as well and you appear to have refills on the Neurontin Please let me know if you need Neurontin and if you are out of refills Lumbar facet, medial branch nerve blocks, to be performed at time return appointment  F/U PCP Dr. [name] and Jeananne Rama for evaliation of  BP and general medical  condition  F/U surgical evaluation. May consider pending follow-up evaluations  F/U neurological evaluation. May consider pending follow-up evaluations  May consider radiofrequency rhizolysis or intraspinal procedures pending response to present treatment and F/U evaluation   Patient to call Pain Management Center should patient have concerns prior to scheduled return appointment. Pain Management Discharge Instructions  General Discharge Instructions :  If you need to reach your doctor call: Monday-Friday 8:00 am - 4:00 pm at [phone] or toll free [phone].  After clinic hours [phone] to have operator reach doctor.  Bring all of your medication bottles to all your appointments in the pain clinic.  To cancel or reschedule your appointment with Pain Management please remember to call 24 hours in advance to avoid a fee.  Refer to the educational materials which you have been given on: General Risks, I had my Procedure. Discharge Instructions, Post Sedation.  Post Procedure Instructions:  The drugs you were given will stay in your system until tomorrow, so for the next 24 hours you should not drive, make any legal decisions or drink any alcoholic beverages.  You may eat anything you prefer, but it is better to start with liquids then soups and crackers, and  gradually work up to solid foods.  Please notify your doctor immediately if you have any unusual bleeding, trouble breathing or pain that is not related to your normal pain.  Depending on the type of procedure that was done, some parts of your body may feel week and/or numb.  This usually clears up by tonight or the next day.  Walk with the use of an assistive device or accompanied by an adult for the 24 hours.  You may use ice on the affected area for the first 24 hours.  Put ice in a Ziploc bag and cover with a towel and place against area 15 minutes on 15 minutes off.  You may switch to heat after 24 hours.GENERAL RISKS AND COMPLICATIONS  What are the risk, side effects and possible complications? Generally speaking, most procedures are safe.  However, with any procedure there are risks, side effects, and the possibility of complications.  The risks and complications are dependent upon the sites that are lesioned, or the type of nerve block to be performed.  The closer the procedure is to the spine, the more serious the risks are.  Great care is taken when placing the radio frequency needles, block needles or lesioning probes, but sometimes complications can occur. 1. Infection: Any time there is an injection through the skin, there is a risk of infection.  This is why sterile conditions are used for these blocks.  There are four possible types of infection. 1. Localized skin infection. 2. Central Nervous System Infection-This can be in the form of Meningitis, which can be deadly. 3. Epidural Infections-This can be in the form of an epidural abscess,  which can cause pressure inside of the spine, causing compression of the spinal cord with subsequent paralysis. This would require an emergency surgery to decompress, and there are no guarantees that the patient would recover from the paralysis. 4. Discitis-This is an infection of the intervertebral discs.  It occurs in about 1% of discography  procedures.  It is difficult to treat and it may lead to surgery.        2. Pain: the needles have to go through skin and soft tissues, will cause soreness.       3. Damage to internal structures:  The nerves to be lesioned may be near blood vessels or    other nerves which can be potentially damaged.       4. Bleeding: Bleeding is more common if the patient is taking blood thinners such as  aspirin, Coumadin, Ticiid, Plavix, etc., or if he/she have some genetic predisposition  such as hemophilia. Bleeding into the spinal canal can cause compression of the spinal  cord with subsequent paralysis.  This would require an emergency surgery to  decompress and there are no guarantees that the patient would recover from the  paralysis.       5. Pneumothorax:  Puncturing of a lung is a possibility, every time a needle is introduced in  the area of the chest or upper back.  Pneumothorax refers to free air around the  collapsed lung(s), inside of the thoracic cavity (chest cavity).  Another two possible  complications related to a similar event would include: Hemothorax and Chylothorax.   These are variations of the Pneumothorax, where instead of air around the collapsed  lung(s), you may have blood or chyle, respectively.       6. Spinal headaches: They may occur with any procedures in the area of the spine.       7. Persistent CSF (Cerebro-Spinal Fluid) leakage: This is a rare problem, but may occur  with prolonged intrathecal or epidural catheters either due to the formation of a fistulous  track or a dural tear.       8. Nerve damage: By working so close to the spinal cord, there is always a possibility of  nerve damage, which could be as serious as a permanent spinal cord injury with  paralysis.       9. Death:  Although rare, severe deadly allergic reactions known as "Anaphylactic  reaction" can occur to any of the medications used.      10. Worsening of the symptoms:  We can always make thing worse.  What  are the chances of something like this happening? Chances of any of this occuring are extremely low.  By statistics, you have more of a chance of getting killed in a motor vehicle accident: while driving to the hospital than any of the above occurring .  Nevertheless, you should be aware that they are possibilities.  In general, it is similar to taking a shower.  Everybody knows that you can slip, hit your head and get killed.  Does that mean that you should not shower again?  Nevertheless always keep in mind that statistics do not mean anything if you happen to be on the wrong side of them.  Even if a procedure has a 1 (one) in a 1,000,000 (million) chance of going wrong, it you happen to be that one..Also, keep in mind that by statistics, you have more of a chance of having something go wrong when taking medications.  Who  should not have this procedure? If you are on a blood thinning medication (e.g. Coumadin, Plavix, see list of "Blood Thinners"), or if you have an active infection going on, you should not have the procedure.  If you are taking any blood thinners, please inform your physician.  How should I prepare for this procedure?  Do not eat or drink anything at least six hours prior to the procedure.  Bring a driver with you .  It cannot be a taxi.  Come accompanied by an adult that can drive you back, and that is strong enough to help you if your legs get weak or numb from the local anesthetic.  Take all of your medicines the morning of the procedure with just enough water to swallow them.  If you have diabetes, make sure that you are scheduled to have your procedure done first thing in the morning, whenever possible.  If you have diabetes, take only half of your insulin dose and notify our nurse that you have done so as soon as you arrive at the clinic.  If you are diabetic, but only take blood sugar pills (oral hypoglycemic), then do not take them on the morning of your procedure.   You may take them after you have had the procedure.  Do not take aspirin or any aspirin-containing medications, at least eleven (11) days prior to the procedure.  They may prolong bleeding.  Wear loose fitting clothing that may be easy to take off and that you would not mind if it got stained with Betadine or blood.  Do not wear any jewelry or perfume  Remove any nail coloring.  It will interfere with some of our monitoring equipment.  NOTE: Remember that this is not meant to be interpreted as a complete list of all possible complications.  Unforeseen problems may occur.  BLOOD THINNERS The following drugs contain aspirin or other products, which can cause increased bleeding during surgery and should not be taken for 2 weeks prior to and 1 week after surgery.  If you should need take something for relief of minor pain, you may take acetaminophen which is found in Tylenol,m Datril, Anacin-3 and Panadol. It is not blood thinner. The products listed below are.  Do not take any of the products listed below in addition to any listed on your instruction sheet.  A.P.C or A.P.C with Codeine Codeine Phosphate Capsules #3 Ibuprofen Ridaura  ABC compound Congesprin Imuran rimadil  Advil Cope Indocin Robaxisal  Alka-Seltzer Effervescent Pain Reliever and Antacid Coricidin or Coricidin-D  Indomethacin Rufen  Alka-Seltzer plus Cold Medicine Cosprin Ketoprofen S-A-C Tablets  Anacin Analgesic Tablets or Capsules Coumadin Korlgesic Salflex  Anacin Extra Strength Analgesic tablets or capsules CP-2 Tablets Lanoril Salicylate  Anaprox Cuprimine Capsules Levenox Salocol  Anexsia-D Dalteparin Magan Salsalate  Anodynos Darvon compound Magnesium Salicylate Sine-off  Ansaid Dasin Capsules Magsal Sodium Salicylate  Anturane Depen Capsules Marnal Soma  APF Arthritis pain formula Dewitt's Pills Measurin Stanback  Argesic Dia-Gesic Meclofenamic Sulfinpyrazone  Arthritis Bayer Timed Release Aspirin Diclofenac  Meclomen Sulindac  Arthritis pain formula Anacin Dicumarol Medipren Supac  Analgesic (Safety coated) Arthralgen Diffunasal Mefanamic Suprofen  Arthritis Strength Bufferin Dihydrocodeine Mepro Compound Suprol  Arthropan liquid Dopirydamole Methcarbomol with Aspirin Synalgos  ASA tablets/Enseals Disalcid Micrainin Tagament  Ascriptin Doan's Midol Talwin  Ascriptin A/D Dolene Mobidin Tanderil  Ascriptin Extra Strength Dolobid Moblgesic Ticlid  Ascriptin with Codeine Doloprin or Doloprin with Codeine Momentum Tolectin  Asperbuf Duoprin Mono-gesic Trendar  Aspergum Duradyne Motrin  or Motrin IB Triminicin  Aspirin plain, buffered or enteric coated Durasal Myochrisine Trigesic  Aspirin Suppositories Easprin Nalfon Trillsate  Aspirin with Codeine Ecotrin Regular or Extra Strength Naprosyn Uracel  Atromid-S Efficin Naproxen Ursinus  Auranofin Capsules Elmiron Neocylate Vanquish  Axotal Emagrin Norgesic Verin  Azathioprine Empirin or Empirin with Codeine Normiflo Vitamin E  Azolid Emprazil Nuprin Voltaren  Bayer Aspirin plain, buffered or children's or timed BC Tablets or powders Encaprin Orgaran Warfarin Sodium  Buff-a-Comp Enoxaparin Orudis Zorpin  Buff-a-Comp with Codeine Equegesic Os-Cal-Gesic   Buffaprin Excedrin plain, buffered or Extra Strength Oxalid   Bufferin Arthritis Strength Feldene Oxphenbutazone   Bufferin plain or Extra Strength Feldene Capsules Oxycodone with Aspirin   Bufferin with Codeine Fenoprofen Fenoprofen Pabalate or Pabalate-SF   Buffets II Flogesic Panagesic   Buffinol plain or Extra Strength Florinal or Florinal with Codeine Panwarfarin   Buf-Tabs Flurbiprofen Penicillamine   Butalbital Compound Four-way cold tablets Penicillin   Butazolidin Fragmin Pepto-Bismol   Carbenicillin Geminisyn Percodan   Carna Arthritis Reliever Geopen Persantine   Carprofen Gold's salt Persistin   Chloramphenicol Goody's Phenylbutazone   Chloromycetin Haltrain Piroxlcam   Clmetidine  heparin Plaquenil   Cllnoril Hyco-pap Ponstel   Clofibrate Hydroxy chloroquine Propoxyphen         Before stopping any of these medications, be sure to consult the physician who ordered them.  Some, such as Coumadin (Warfarin) are ordered to prevent or treat serious conditions such as "deep thrombosis", "pumonary embolisms", and other heart problems.  The amount of time that you may need off of the medication may also vary with the medication and the reason for which you were taking it.  If you are taking any of these medications, please make sure you notify your pain physician before you undergo any procedures.         Facet Blocks Patient Information  Description: The facets are joints in the spine between the vertebrae.  Like any joints in the body, facets can become irritated and painful.  Arthritis can also effect the facets.  By injecting steroids and local anesthetic in and around these joints, we can temporarily block the nerve supply to them.  Steroids act directly on irritated nerves and tissues to reduce selling and inflammation which often leads to decreased pain.  Facet blocks may be done anywhere along the spine from the neck to the low back depending upon the location of your pain.   After numbing the skin with local anesthetic (like Novocaine), a small needle is passed onto the facet joints under x-ray guidance.  You may experience a sensation of pressure while this is being done.  The entire block usually lasts about 15-25 minutes.   Conditions which may be treated by facet blocks:   Low back/buttock pain  Neck/shoulder pain  Certain types of headaches  Preparation for the injection:  1. Do not eat any solid food or dairy products within 6 hours of your appointment. 2. You may drink clear liquid up to 2 hours before appointment.  Clear liquids include water, black coffee, juice or soda.  No milk or cream please. 3. You may take your regular medication, including pain  medications, with a sip of water before your appointment.  Diabetics should hold regular insulin (if taken separately) and take 1/2 normal NPH dose the morning of the procedure.  Carry some sugar containing items with you to your appointment. 4. A driver must accompany you and be prepared to drive you home after your  procedure. 5. Bring all your current medications with you. 6. An IV may be inserted and sedation may be given at the discretion of the physician. 7. A blood pressure cuff, EKG and other monitors will often be applied during the procedure.  Some patients may need to have extra oxygen administered for a short period. 8. You will be asked to provide medical information, including your allergies and medications, prior to the procedure.  We must know immediately if you are taking blood thinners (like Coumadin/Warfarin) or if you are allergic to IV iodine contrast (dye).  We must know if you could possible be pregnant.  Possible side-effects:   Bleeding from needle site  Infection (rare, may require surgery)  Nerve injury (rare)  Numbness & tingling (temporary)  Difficulty urinating (rare, temporary)  Spinal headache (a headache worse with upright posture)  Light-headedness (temporary)  Pain at injection site (serveral days)  Decreased blood pressure (rare, temporary)  Weakness in arm/leg (temporary)  Pressure sensation in back/neck (temporary)   Call if you experience:   Fever/chills associated with headache or increased back/neck pain  Headache worsened by an upright position  New onset, weakness or numbness of an extremity below the injection site  Hives or difficulty breathing (go to the emergency room)  Inflammation or drainage at the injection site(s)  Severe back/neck pain greater than usual  New symptoms which are concerning to you  Please note:  Although the local anesthetic injected can often make your back or neck feel good for several hours after  the injection, the pain will likely return. It takes 3-7 days for steroids to work.  You may not notice any pain relief for at least one week.  If effective, we will often do a series of 2-3 injections spaced 3-6 weeks apart to maximally decrease your pain.  After the initial series, you may be a candidate for a more permanent nerve block of the facets.  If you have any questions, please call #[phone]) [facility]

## 2015-08-20 NOTE — Progress Notes (Signed)
Subjective:    Patient ID: [name], female    DOB: [birth year], 59 y.o.   MRN: [id number]  HPI   patient is 59 year old female who returns a [facility] for further evaluation and treatment of pain involving the region of the lower back and lower extremity region. Patient states she has significant improvement of her pain with prior procedure  Lumbar epidural steroid injection. Patient states predominate pain present time is aggravated by twisting turning maneuvers. Patient is difficult turning over in bed as well. We discussed patient's condition and there is concern regarding significant component of patient's pain being due to lumbar  Facet syndrome. We will proceed with lumbar facet, medial branch nerve, blocks at time return appointment in attempt to decrease severity of symptoms, minimize progression of patient's symptoms, and avoid need for more involved treatment. The patient was in agreement with suggested treatment plan. The patient was provided prescription for hydrocodone acetaminophen on today's visit and cautioned regarding respiratory depression confusion and other undesirable side effects which can occur with such medication . The patient expressed understanding and willingness to comply to avoid undesirable side effects as discussed.     Review of Systems     Objective:   Physical Exam   there was tends to palpation of the region of the splenius capitis and occipitalis musculature regions. Palpation of these areas reproduced pain of mild degree. There was mild tenderness to palpation of the acromioclavicular and glenohumeral joint regions. Palpation of the cervical facet cervical paraspinal musculature region was with mild to moderate tends to palpation. There was mild to moderate tenderness of the thoracic facet thoracic paraspinal musculature region and muscle spasms of the lower thoracic region of moderate degree.  Moderate muscle spasms of the lower thoracic  region  were noted. Patient appeared to be with bilaterally equal grip strength. Tinel and Phalen's maneuver were without increase of pain of significant degree. There was tenderness over the lumbar paraspinal musculature region on the left as well as on the right. Extension and palpation of the lumbar facets lumbar paraspinal musculature region reproduced moderate to moderately severe discomfort. There was moderate tends to palpation of the PSIS and PII S region as well as the gluteal and piriformis musculature regions. Straight leg raising was tolerates approximately 20 without a definite increased pain with dorsiflexion noted. EHL strength appeared to be decreased  And no definite sensory deficit of dermatomal distribution was detected period there was negative clonus negative Homans. There was mild tends to palpation of the greater trochanteric region and iliotibial band region. 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 Multilevel facet arthropathy at L2-3, L3-4, L4-5, with disc space narrowing as well Disc desiccation at T12-L1 to L4-L5  Lumbar facet syndrome  Lumbar radiculopathy  Lumbar stenosis    PLAN   Continue present medication. The patient was given prescription for hydrocodone acetaminophen on today's visit. The patient was cautioned regarding respiratory depression confusion and other side effects which can occur with such medication.  Lumbar facet, medial branch nerve, blocks to be performed at time return appointment  F/U PCP Dr. [name] and Jeananne Rama for evaliation of  BP and general medical  condition  F/U surgical evaluation. May consider pending follow-up evaluations  F/U neurological evaluation. May consider pending follow-up evaluations  May consider radiofrequency rhizolysis or intraspinal procedures pending response to present treatment and  F/U  evaluation   Patient to call Pain Management Center should patient have concerns prior to scheduled return appointment.

## 2015-08-20 NOTE — Progress Notes (Signed)
Patient will d/c ASA 7 days prior to procedure date.  Patient will take BP medicine morning of the procedure.

## 2015-08-21 NOTE — Telephone Encounter (Signed)
This would be fine.  Thank you.

## 2015-08-21 NOTE — Telephone Encounter (Signed)
Called Dr. Ethel Rana office and let Tommi Rumps know that Malachy Mood said this would be fine.

## 2015-08-28 ENCOUNTER — Telehealth: Payer: Self-pay | Admitting: Pain Medicine

## 2015-08-28 NOTE — Telephone Encounter (Signed)
Nurses, Patient is without severe symptoms at this time and plans to come for procedure as planned

## 2015-08-28 NOTE — Telephone Encounter (Signed)
Pt sch for facets on Mondat 10/3 but has a cold and wants to make sure she can still have procedure

## 2015-08-29 NOTE — Telephone Encounter (Signed)
Patient notified per voicemail ok to have procedure, provided she does not have a fever or is severely ill.

## 2015-09-02 ENCOUNTER — Ambulatory Visit: Payer: BLUE CROSS/BLUE SHIELD | Attending: Pain Medicine | Admitting: Pain Medicine

## 2015-09-02 ENCOUNTER — Encounter: Payer: Self-pay | Admitting: Pain Medicine

## 2015-09-02 VITALS — BP 118/51 | HR 81 | Temp 98.7°F | Resp 17 | Ht 63.0 in | Wt 265.0 lb

## 2015-09-02 DIAGNOSIS — Z981 Arthrodesis status: Secondary | ICD-10-CM

## 2015-09-02 DIAGNOSIS — M47816 Spondylosis without myelopathy or radiculopathy, lumbar region: Secondary | ICD-10-CM

## 2015-09-02 DIAGNOSIS — M48062 Spinal stenosis, lumbar region with neurogenic claudication: Secondary | ICD-10-CM

## 2015-09-02 DIAGNOSIS — M170 Bilateral primary osteoarthritis of knee: Secondary | ICD-10-CM

## 2015-09-02 DIAGNOSIS — M5136 Other intervertebral disc degeneration, lumbar region: Secondary | ICD-10-CM | POA: Diagnosis present

## 2015-09-02 DIAGNOSIS — M1288 Other specific arthropathies, not elsewhere classified, other specified site: Secondary | ICD-10-CM | POA: Insufficient documentation

## 2015-09-02 DIAGNOSIS — M533 Sacrococcygeal disorders, not elsewhere classified: Secondary | ICD-10-CM

## 2015-09-02 MED ORDER — ORPHENADRINE CITRATE 30 MG/ML IJ SOLN
INTRAMUSCULAR | Status: AC
  Administered 2015-09-02: 10:00:00
  Filled 2015-09-02: qty 2

## 2015-09-02 MED ORDER — TRIAMCINOLONE ACETONIDE 40 MG/ML IJ SUSP
INTRAMUSCULAR | Status: AC
  Administered 2015-09-02: 10:00:00
  Filled 2015-09-02: qty 1

## 2015-09-02 MED ORDER — FENTANYL CITRATE (PF) 100 MCG/2ML IJ SOLN
INTRAMUSCULAR | Status: AC
  Administered 2015-09-02: 100 ug
  Filled 2015-09-02: qty 2

## 2015-09-02 MED ORDER — CIPROFLOXACIN HCL 250 MG PO TABS: 250.0000 mg | ORAL_TABLET | Freq: Two times a day (BID) | ORAL | Status: DC

## 2015-09-02 MED ORDER — CIPROFLOXACIN IN D5W 400 MG/200ML IV SOLN
INTRAVENOUS | Status: AC
  Administered 2015-09-02: 400 mg
  Filled 2015-09-02: qty 200

## 2015-09-02 MED ORDER — MIDAZOLAM HCL 5 MG/5ML IJ SOLN
INTRAMUSCULAR | Status: AC
  Administered 2015-09-02: 5 mg
  Filled 2015-09-02: qty 5

## 2015-09-02 MED ORDER — BUPIVACAINE HCL (PF) 0.25 % IJ SOLN
INTRAMUSCULAR | Status: AC
  Administered 2015-09-02: 10:00:00
  Filled 2015-09-02: qty 30

## 2015-09-02 NOTE — Patient Instructions (Addendum)
PLAN  Continue present medication and begin taking antibiotic Cipro as prescribed. Please obtain your antibiotic today and begin taking antibiotic Cipro today  F/U PCP Dr. [name] for evaliation of  BP and general medical  condition.  F/U surgical evaluation. May consider pending follow-up evaluations  F/U neurological evaluation. May consider pending follow-up evaluations  May consider radiofrequency rhizolysis or intraspinal procedures pending response to present treatment and F/U evaluation.  Patient to call [facility] should patient have concerns prior to scheduled return appointment.  Pain Management Discharge Instructions  General Discharge Instructions :  If you need to reach your doctor call: Monday-Friday 8:00 am - 4:00 pm at [phone] or toll free [phone].  After clinic hours [phone] to have operator reach doctor.  Bring all of your medication bottles to all your appointments in the pain clinic.  To cancel or reschedule your appointment with Pain Management please remember to call 24 hours in advance to avoid a fee.  Refer to the educational materials which you have been given on: General Risks, I had my Procedure. Discharge Instructions, Post Sedation.  Post Procedure Instructions:  The drugs you were given will stay in your system until tomorrow, so for the next 24 hours you should not drive, make any legal decisions or drink any alcoholic beverages.  You may eat anything you prefer, but it is better to start with liquids then soups and crackers, and gradually work up to solid foods.  Please notify your doctor immediately if you have any unusual bleeding, trouble breathing or pain that is not related to your normal pain.  Depending on the type of procedure that was done, some parts of your body may feel week and/or numb.  This usually clears up by tonight or the next day.  Walk with the use of an assistive device or accompanied by an adult for  the 24 hours.  You may use ice on the affected area for the first 24 hours.  Put ice in a Ziploc bag and cover with a towel and place against area 15 minutes on 15 minutes off.  You may switch to heat after 24 hours.GENERAL RISKS AND COMPLICATIONS  What are the risk, side effects and possible complications? Generally speaking, most procedures are safe.  However, with any procedure there are risks, side effects, and the possibility of complications.  The risks and complications are dependent upon the sites that are lesioned, or the type of nerve block to be performed.  The closer the procedure is to the spine, the more serious the risks are.  Great care is taken when placing the radio frequency needles, block needles or lesioning probes, but sometimes complications can occur. 1. Infection: Any time there is an injection through the skin, there is a risk of infection.  This is why sterile conditions are used for these blocks.  There are four possible types of infection. 1. Localized skin infection. 2. Central Nervous System Infection-This can be in the form of Meningitis, which can be deadly. 3. Epidural Infections-This can be in the form of an epidural abscess, which can cause pressure inside of the spine, causing compression of the spinal cord with subsequent paralysis. This would require an emergency surgery to decompress, and there are no guarantees that the patient would recover from the paralysis. 4. Discitis-This is an infection of the intervertebral discs.  It occurs in about 1% of discography procedures.  It is difficult to treat and it may lead to surgery.  2. Pain: the needles have to go through skin and soft tissues, will cause soreness.       3. Damage to internal structures:  The nerves to be lesioned may be near blood vessels or    other nerves which can be potentially damaged.       4. Bleeding: Bleeding is more common if the patient is taking blood thinners such as  aspirin,  Coumadin, Ticiid, Plavix, etc., or if he/she have some genetic predisposition  such as hemophilia. Bleeding into the spinal canal can cause compression of the spinal  cord with subsequent paralysis.  This would require an emergency surgery to  decompress and there are no guarantees that the patient would recover from the  paralysis.       5. Pneumothorax:  Puncturing of a lung is a possibility, every time a needle is introduced in  the area of the chest or upper back.  Pneumothorax refers to free air around the  collapsed lung(s), inside of the thoracic cavity (chest cavity).  Another two possible  complications related to a similar event would include: Hemothorax and Chylothorax.   These are variations of the Pneumothorax, where instead of air around the collapsed  lung(s), you may have blood or chyle, respectively.       6. Spinal headaches: They may occur with any procedures in the area of the spine.       7. Persistent CSF (Cerebro-Spinal Fluid) leakage: This is a rare problem, but may occur  with prolonged intrathecal or epidural catheters either due to the formation of a fistulous  track or a dural tear.       8. Nerve damage: By working so close to the spinal cord, there is always a possibility of  nerve damage, which could be as serious as a permanent spinal cord injury with  paralysis.       9. Death:  Although rare, severe deadly allergic reactions known as "Anaphylactic  reaction" can occur to any of the medications used.      10. Worsening of the symptoms:  We can always make thing worse.  What are the chances of something like this happening? Chances of any of this occuring are extremely low.  By statistics, you have more of a chance of getting killed in a motor vehicle accident: while driving to the hospital than any of the above occurring .  Nevertheless, you should be aware that they are possibilities.  In general, it is similar to taking a shower.  Everybody knows that you can slip, hit  your head and get killed.  Does that mean that you should not shower again?  Nevertheless always keep in mind that statistics do not mean anything if you happen to be on the wrong side of them.  Even if a procedure has a 1 (one) in a 1,000,000 (million) chance of going wrong, it you happen to be that one..Also, keep in mind that by statistics, you have more of a chance of having something go wrong when taking medications.  Who should not have this procedure? If you are on a blood thinning medication (e.g. Coumadin, Plavix, see list of "Blood Thinners"), or if you have an active infection going on, you should not have the procedure.  If you are taking any blood thinners, please inform your physician.  How should I prepare for this procedure?  Do not eat or drink anything at least six hours prior to the procedure.  Bring a driver  with you .  It cannot be a taxi.  Come accompanied by an adult that can drive you back, and that is strong enough to help you if your legs get weak or numb from the local anesthetic.  Take all of your medicines the morning of the procedure with just enough water to swallow them.  If you have diabetes, make sure that you are scheduled to have your procedure done first thing in the morning, whenever possible.  If you have diabetes, take only half of your insulin dose and notify our nurse that you have done so as soon as you arrive at the clinic.  If you are diabetic, but only take blood sugar pills (oral hypoglycemic), then do not take them on the morning of your procedure.  You may take them after you have had the procedure.  Do not take aspirin or any aspirin-containing medications, at least eleven (11) days prior to the procedure.  They may prolong bleeding.  Wear loose fitting clothing that may be easy to take off and that you would not mind if it got stained with Betadine or blood.  Do not wear any jewelry or perfume  Remove any nail coloring.  It will interfere  with some of our monitoring equipment.  NOTE: Remember that this is not meant to be interpreted as a complete list of all possible complications.  Unforeseen problems may occur.  BLOOD THINNERS The following drugs contain aspirin or other products, which can cause increased bleeding during surgery and should not be taken for 2 weeks prior to and 1 week after surgery.  If you should need take something for relief of minor pain, you may take acetaminophen which is found in Tylenol,m Datril, Anacin-3 and Panadol. It is not blood thinner. The products listed below are.  Do not take any of the products listed below in addition to any listed on your instruction sheet.  A.P.C or A.P.C with Codeine Codeine Phosphate Capsules #3 Ibuprofen Ridaura  ABC compound Congesprin Imuran rimadil  Advil Cope Indocin Robaxisal  Alka-Seltzer Effervescent Pain Reliever and Antacid Coricidin or Coricidin-D  Indomethacin Rufen  Alka-Seltzer plus Cold Medicine Cosprin Ketoprofen S-A-C Tablets  Anacin Analgesic Tablets or Capsules Coumadin Korlgesic Salflex  Anacin Extra Strength Analgesic tablets or capsules CP-2 Tablets Lanoril Salicylate  Anaprox Cuprimine Capsules Levenox Salocol  Anexsia-D Dalteparin Magan Salsalate  Anodynos Darvon compound Magnesium Salicylate Sine-off  Ansaid Dasin Capsules Magsal Sodium Salicylate  Anturane Depen Capsules Marnal Soma  APF Arthritis pain formula Dewitt's Pills Measurin Stanback  Argesic Dia-Gesic Meclofenamic Sulfinpyrazone  Arthritis Bayer Timed Release Aspirin Diclofenac Meclomen Sulindac  Arthritis pain formula Anacin Dicumarol Medipren Supac  Analgesic (Safety coated) Arthralgen Diffunasal Mefanamic Suprofen  Arthritis Strength Bufferin Dihydrocodeine Mepro Compound Suprol  Arthropan liquid Dopirydamole Methcarbomol with Aspirin Synalgos  ASA tablets/Enseals Disalcid Micrainin Tagament  Ascriptin Doan's Midol Talwin  Ascriptin A/D Dolene Mobidin Tanderil  Ascriptin  Extra Strength Dolobid Moblgesic Ticlid  Ascriptin with Codeine Doloprin or Doloprin with Codeine Momentum Tolectin  Asperbuf Duoprin Mono-gesic Trendar  Aspergum Duradyne Motrin or Motrin IB Triminicin  Aspirin plain, buffered or enteric coated Durasal Myochrisine Trigesic  Aspirin Suppositories Easprin Nalfon Trillsate  Aspirin with Codeine Ecotrin Regular or Extra Strength Naprosyn Uracel  Atromid-S Efficin Naproxen Ursinus  Auranofin Capsules Elmiron Neocylate Vanquish  Axotal Emagrin Norgesic Verin  Azathioprine Empirin or Empirin with Codeine Normiflo Vitamin E  Azolid Emprazil Nuprin Voltaren  Bayer Aspirin plain, buffered or children's or timed BC Tablets or powders  Encaprin Orgaran Warfarin Sodium  Buff-a-Comp Enoxaparin Orudis Zorpin  Buff-a-Comp with Codeine Equegesic Os-Cal-Gesic   Buffaprin Excedrin plain, buffered or Extra Strength Oxalid   Bufferin Arthritis Strength Feldene Oxphenbutazone   Bufferin plain or Extra Strength Feldene Capsules Oxycodone with Aspirin   Bufferin with Codeine Fenoprofen Fenoprofen Pabalate or Pabalate-SF   Buffets II Flogesic Panagesic   Buffinol plain or Extra Strength Florinal or Florinal with Codeine Panwarfarin   Buf-Tabs Flurbiprofen Penicillamine   Butalbital Compound Four-way cold tablets Penicillin   Butazolidin Fragmin Pepto-Bismol   Carbenicillin Geminisyn Percodan   Carna Arthritis Reliever Geopen Persantine   Carprofen Gold's salt Persistin   Chloramphenicol Goody's Phenylbutazone   Chloromycetin Haltrain Piroxlcam   Clmetidine heparin Plaquenil   Cllnoril Hyco-pap Ponstel   Clofibrate Hydroxy chloroquine Propoxyphen         Before stopping any of these medications, be sure to consult the physician who ordered them.  Some, such as Coumadin (Warfarin) are ordered to prevent or treat serious conditions such as "deep thrombosis", "pumonary embolisms", and other heart problems.  The amount of time that you may need off of the  medication may also vary with the medication and the reason for which you were taking it.  If you are taking any of these medications, please make sure you notify your pain physician before you undergo any procedures.

## 2015-09-02 NOTE — Progress Notes (Signed)
Subjective:    Patient ID: [name], female    DOB: [birth year], 59 y.o.   MRN: [id number]  HPI  PROCEDURE PERFORMED: Lumbar facet (medial branch block)   NOTE: The patient is a 59 y.o. female who returns to [facility] for further evaluation and treatment of pain involving the lumbar and lower extremity region. 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 Patient is with reproduction of severe pain with palpation over the lumbar facet lumbar paraspinal musculature region. There is concern regarding significant component of patient's pain being due to facet arthropathy with facet syndrome The risks, benefits, and expectations of the procedure have been discussed and explained to the patient who was understanding and in agreement with suggested treatment plan. We will proceed with interventional treatment as discussed and as explained to the patient who was understanding and wished to proceed with procedure as planned.   DESCRIPTION OF PROCEDURE: Lumbar facet (medial branch block) with IV Versed, IV fentanyl conscious sedation, EKG, blood pressure, pulse, and pulse oximetry monitoring. The procedure was performed with the patient in the prone position. Betadine prep of proposed entry site performed.   NEEDLE PLACEMENT AT: Left L 3 lumbar facet (medial branch block). Under fluoroscopic guidance with oblique orientation of 15 degrees, a 22-gauge needle was inserted at the L 3 vertebral body level with needle placed at the targeted area of Burton's Eye or Eye of the Scotty Dog with documentation of needle placement in the superior and lateral border of targeted area of Burton's Eye or Eye of the Scotty Dog with oblique orientation of 15 degrees. Following documentation of needle placement at the L 3  vertebral body level, needle placement was then accomplished at the L 4 vertebral body level.   NEEDLE PLACEMENT AT L4 and L5 VERTEBRAL BODY LEVELS ON THE LEFT SIDE The procedure was performed at the L3 and L4 vertebral body levels exactly as was performed at the L 3 vertebral body level utilizing the same technique and under fluoroscopic guidance.  NEEDLE PLACEMENT AT THE SACRAL ALA with AP view of the lumbosacral spine. With the patient in the prone position, Betadine prep of proposed entry site accomplished, a 22 gauge needle was inserted in the region of the sacral ala (groove formed by the superior articulating process of S1 and the sacral wing). Following documentation of needle placement at the sacral ala,  needle placement was then accomplished at the S1 foramen level.   NEEDLE PLACEMENT AT THE S1 FORAMEN LEVEL under fluoroscopic guidance with AP view of the lumbosacral spine and cephalad orientation of the fluoroscope, a 22-gauge needle was placed at the superior and lateral border of the S1 foramen under fluoroscopic guidance. Following documentation of needle placement at the S1 foramen.   Needle placement was then verified at all levels on lateral view. Following documentation of needle placement at all levels on lateral view and following negative aspiration for heme and CSF, each level was injected with 1 mL of 0.25% bupivacaine with Kenalog.     LUMBAR FACET, MEDIAL BRANCH NERVE, BLOCKS PERFORMED ON THE RIGHT SIDE   The procedure was performed on the right side exactly as was performed on the left side at the same levels and utilizing the same technique under fluoroscopic guidance.     The patient tolerated  the procedure well. A total of 40 mg of Kenalog was utilized for the procedure.   PLAN:  1. Medications: The patient will continue presently prescribed medications. 2. May consider modification of treatment regimen at time of return appointment pending response to  treatment rendered on today's visit. 3. The patient is to follow-up with primary care physician Dr. [name] and [facility] for further evaluation of blood pressure and general medical condition status post steroid injection performed on today's visit. 4. Surgical follow-up evaluation as discussed 5. Neurological follow-up evaluation. May consider pending follow-up evaluations of response to treatment 6. The patient may be candidate for radiofrequency procedures, implantation type procedures, and other treatment pending response to treatment and follow-up evaluation. 7. The patient has been advised to call the Pain Management Center prior to scheduled return appointment should there be significant change in condition or should patient have other concerns regarding condition prior to scheduled return appointment.  The patient is understanding and in agreement with suggested treatment plan.   Review of Systems     Objective:   Physical Exam        Assessment & Plan:

## 2015-09-02 NOTE — Progress Notes (Signed)
   Subjective:    Patient ID: [name], female    DOB: 01-10-1956, 58 y.o.   MRN: [id number]  HPI    Review of Systems     Objective:   Physical Exam        Assessment & Plan:  Safety precautions to be maintained throughout the outpatient stay will include: orient to surroundings, keep bed in low position, maintain call bell within reach at all times, provide assistance with transfer out of bed and ambulation.

## 2015-09-03 ENCOUNTER — Telehealth: Payer: Self-pay | Admitting: *Deleted

## 2015-09-03 NOTE — Telephone Encounter (Signed)
Patient verbalizes no complaints.

## 2015-09-20 ENCOUNTER — Other Ambulatory Visit: Payer: Self-pay | Admitting: Pain Medicine

## 2015-09-23 ENCOUNTER — Other Ambulatory Visit: Payer: Self-pay | Admitting: Pain Medicine

## 2015-09-24 NOTE — Telephone Encounter (Signed)
Patient called and informed must have eval appointment to refill medications.

## 2015-09-24 NOTE — Telephone Encounter (Signed)
Nurses Please inform patient that patient will need evaluation prior to refill of hydrocodone acetaminophen

## 2015-10-01 ENCOUNTER — Ambulatory Visit: Payer: BLUE CROSS/BLUE SHIELD | Attending: Pain Medicine | Admitting: Pain Medicine

## 2015-10-01 ENCOUNTER — Encounter: Payer: Self-pay | Admitting: Pain Medicine

## 2015-10-01 VITALS — BP 130/61 | HR 81 | Temp 98.3°F | Resp 15 | Ht 63.0 in | Wt 265.0 lb

## 2015-10-01 DIAGNOSIS — M79605 Pain in left leg: Secondary | ICD-10-CM | POA: Diagnosis present

## 2015-10-01 DIAGNOSIS — M5136 Other intervertebral disc degeneration, lumbar region: Secondary | ICD-10-CM | POA: Insufficient documentation

## 2015-10-01 DIAGNOSIS — M5126 Other intervertebral disc displacement, lumbar region: Secondary | ICD-10-CM | POA: Diagnosis not present

## 2015-10-01 DIAGNOSIS — M533 Sacrococcygeal disorders, not elsewhere classified: Secondary | ICD-10-CM

## 2015-10-01 DIAGNOSIS — M5416 Radiculopathy, lumbar region: Secondary | ICD-10-CM | POA: Diagnosis not present

## 2015-10-01 DIAGNOSIS — Z981 Arthrodesis status: Secondary | ICD-10-CM

## 2015-10-01 DIAGNOSIS — M4806 Spinal stenosis, lumbar region: Secondary | ICD-10-CM | POA: Diagnosis not present

## 2015-10-01 DIAGNOSIS — M79604 Pain in right leg: Secondary | ICD-10-CM | POA: Diagnosis present

## 2015-10-01 DIAGNOSIS — M545 Low back pain: Secondary | ICD-10-CM | POA: Diagnosis present

## 2015-10-01 DIAGNOSIS — M47816 Spondylosis without myelopathy or radiculopathy, lumbar region: Secondary | ICD-10-CM

## 2015-10-01 DIAGNOSIS — M170 Bilateral primary osteoarthritis of knee: Secondary | ICD-10-CM

## 2015-10-01 DIAGNOSIS — M48062 Spinal stenosis, lumbar region with neurogenic claudication: Secondary | ICD-10-CM

## 2015-10-01 MED ORDER — CARISOPRODOL 350 MG PO TABS: ORAL_TABLET | ORAL | Status: DC

## 2015-10-01 MED ORDER — HYDROCODONE-ACETAMINOPHEN 10-325 MG PO TABS: ORAL_TABLET | ORAL | Status: DC

## 2015-10-01 NOTE — Progress Notes (Signed)
Safety precautions to be maintained throughout the outpatient stay will include: orient to surroundings, keep bed in low position, maintain call bell within reach at all times, provide assistance with transfer out of bed and ambulation.  

## 2015-10-01 NOTE — Patient Instructions (Addendum)
PLAN   Continue present medications Soma and hydrocodone acetaminophen  Lumbosacral selective nerve root block to be performed at time return appointment  F/U PCP [name] for evaliation of  BP and general medical  condition  F/U surgical evaluation. May consider pending follow-up evaluations  F/U neurological evaluation. May consider pending follow-up evaluations  May consider radiofrequency rhizolysis or intraspinal procedures pending response to present treatment and F/U evaluation   Patient to call [facility] should patient have concerns prior to scheduled return appointment. Selective Nerve Root Block Patient Information  Description: Specific nerve roots exit the spinal canal and these nerves can be compressed and inflamed by a bulging disc and bone spurs.  By injecting steroids on the nerve root, we can potentially decrease the inflammation surrounding these nerves, which often leads to decreased pain.  Also, by injecting local anesthesia on the nerve root, this can provide [place] helpful information to give to your referring doctor if it decreases your pain.  Selective nerve root blocks can be done along the spine from the neck to the low back depending on the location of your pain.   After numbing the skin with local anesthesia, a small needle is passed to the nerve root and the position of the needle is verified using x-ray pictures.  After the needle is in correct position, we then deposit the medication.  You may experience a pressure sensation while this is being done.  The entire block usually lasts less than 15 minutes.  Conditions that may be treated with selective nerve root blocks:  Low back and leg pain  Spinal stenosis  Diagnostic block prior to potential surgery  Neck and arm pain  Post laminectomy syndrome  Preparation for the injection:  1. Do not eat any solid food or dairy products within 6 hours of your appointment. 2. You may drink clear liquids  up to 2 hours before an appointment.  Clear liquids include water, black coffee, juice or soda.  No milk or cream please. 3. You may take your regular medications, including pain medications, with a sip of water before your appointment.  Diabetics should hold regular insulin (if taken separately) and take 1/2 normal NPH dose the morning of the procedure.  Carry some sugar containing items with you to your appointment. 4. A driver must accompany you and be prepared to drive you home after your procedure. 5. Bring all your current medications with you. 6. An IV may be inserted and sedation may be given at the discretion of the physician. 7. A blood pressure cuff, EKG, and other monitors will often be applied during the procedure.  Some patients may need to have extra oxygen administered for a short period. 8. You will be asked to provide medical information, including allergies, prior to the procedure.  We must know immediately if you are taking blood  Thinners (like Coumadin) or if you are allergic to IV iodine contrast (dye).  Possible side-effects: All are usually temporary  Bleeding from needle site  Light headedness  Numbness and tingling  Decreased blood pressure  Weakness in arms/legs  Pressure sensation in back/neck  Pain at injection site (several days)  Possible complications: All are extremely rare  Infection  Nerve injury  Spinal headache (a headache wore with upright position)  Call if you experience:  Fever/chills associated with headache or increased back/neck pain  Headache worsened by an upright position  New onset weakness or numbness of an extremity below the injection site  Hives  or difficulty breathing (go to the emergency room)  Inflammation or drainage at the injection site(s)  Severe back/neck pain greater than usual  New symptoms which are concerning to you  Please note:  Although the local anesthetic injected can often make your back or  neck feel good for several hours after the injection the pain will likely return.  It takes 3-5 days for steroids to work on the nerve root. You may not notice any pain relief for at least one week.  If effective, we will often do a series of 3 injections spaced 3-6 weeks apart to maximally decrease your pain.    If you have any questions, please call [phone] [facility] Clinic[facility] RISKS AND COMPLICATIONS  What are the risk, side effects and possible complications? Generally speaking, most procedures are safe.  However, with any procedure there are risks, side effects, and the possibility of complications.  The risks and complications are dependent upon the sites that are lesioned, or the type of nerve block to be performed.  The closer the procedure is to the spine, the more serious the risks are.  Great care is taken when placing the radio frequency needles, block needles or lesioning probes, but sometimes complications can occur. 1. Infection: Any time there is an injection through the skin, there is a risk of infection.  This is why sterile conditions are used for these blocks.  There are four possible types of infection. 1. Localized skin infection. 2. Central Nervous System Infection-This can be in the form of Meningitis, which can be deadly. 3. Epidural Infections-This can be in the form of an epidural abscess, which can cause pressure inside of the spine, causing compression of the spinal cord with subsequent paralysis. This would require an emergency surgery to decompress, and there are no guarantees that the patient would recover from the paralysis. 4. Discitis-This is an infection of the intervertebral discs.  It occurs in about 1% of discography procedures.  It is difficult to treat and it may lead to surgery.        2. Pain: the needles have to go through skin and soft tissues, will cause soreness.       3. Damage to internal structures:  The nerves to  be lesioned may be near blood vessels or    other nerves which can be potentially damaged.       4. Bleeding: Bleeding is more common if the patient is taking blood thinners such as  aspirin, Coumadin, Ticiid, Plavix, etc., or if he/she have some genetic predisposition  such as hemophilia. Bleeding into the spinal canal can cause compression of the spinal  cord with subsequent paralysis.  This would require an emergency surgery to  decompress and there are no guarantees that the patient would recover from the  paralysis.       5. Pneumothorax:  Puncturing of a lung is a possibility, every time a needle is introduced in  the area of the chest or upper back.  Pneumothorax refers to free air around the  collapsed lung(s), inside of the thoracic cavity (chest cavity).  Another two possible  complications related to a similar event would include: Hemothorax and Chylothorax.   These are variations of the Pneumothorax, where instead of air around the collapsed  lung(s), you may have blood or chyle, respectively.       6. Spinal headaches: They may occur with any procedures in the area of the spine.  7. Persistent CSF (Cerebro-Spinal Fluid) leakage: This is a rare problem, but may occur  with prolonged intrathecal or epidural catheters either due to the formation of a fistulous  track or a dural tear.       8. Nerve damage: By working so close to the spinal cord, there is always a possibility of  nerve damage, which could be as serious as a permanent spinal cord injury with  paralysis.       9. Death:  Although rare, severe deadly allergic reactions known as "Anaphylactic  reaction" can occur to any of the medications used.      10. Worsening of the symptoms:  We can always make thing worse.  What are the chances of something like this happening? Chances of any of this occuring are extremely low.  By statistics, you have more of a chance of getting killed in a motor vehicle accident: while driving to the  hospital than any of the above occurring .  Nevertheless, you should be aware that they are possibilities.  In general, it is similar to taking a shower.  Everybody knows that you can slip, hit your head and get killed.  Does that mean that you should not shower again?  Nevertheless always keep in mind that statistics do not mean anything if you happen to be on the wrong side of them.  Even if a procedure has a 1 (one) in a 1,000,000 (million) chance of going wrong, it you happen to be that one..Also, keep in mind that by statistics, you have more of a chance of having something go wrong when taking medications.  Who should not have this procedure? If you are on a blood thinning medication (e.g. Coumadin, Plavix, see list of "Blood Thinners"), or if you have an active infection going on, you should not have the procedure.  If you are taking any blood thinners, please inform your physician.  How should I prepare for this procedure?  Do not eat or drink anything at least six hours prior to the procedure.  Bring a driver with you .  It cannot be a taxi.  Come accompanied by an adult that can drive you back, and that is strong enough to help you if your legs get weak or numb from the local anesthetic.  Take all of your medicines the morning of the procedure with just enough water to swallow them.  If you have diabetes, make sure that you are scheduled to have your procedure done first thing in the morning, whenever possible.  If you have diabetes, take only half of your insulin dose and notify our nurse that you have done so as soon as you arrive at the clinic.  If you are diabetic, but only take blood sugar pills (oral hypoglycemic), then do not take them on the morning of your procedure.  You may take them after you have had the procedure.  Do not take aspirin or any aspirin-containing medications, at least eleven (11) days prior to the procedure.  They may prolong bleeding.  Wear loose fitting  clothing that may be easy to take off and that you would not mind if it got stained with Betadine or blood.  Do not wear any jewelry or perfume  Remove any nail coloring.  It will interfere with some of our monitoring equipment.  NOTE: Remember that this is not meant to be interpreted as a complete list of all possible complications.  Unforeseen problems may occur.  BLOOD THINNERS  The following drugs contain aspirin or other products, which can cause increased bleeding during surgery and should not be taken for 2 weeks prior to and 1 week after surgery.  If you should need take something for relief of minor pain, you may take acetaminophen which is found in Tylenol,m Datril, Anacin-3 and Panadol. It is not blood thinner. The products listed below are.  Do not take any of the products listed below in addition to any listed on your instruction sheet.  A.P.C or A.P.C with Codeine Codeine Phosphate Capsules #3 Ibuprofen Ridaura  ABC compound Congesprin Imuran rimadil  Advil Cope Indocin Robaxisal  Alka-Seltzer Effervescent Pain Reliever and Antacid Coricidin or Coricidin-D  Indomethacin Rufen  Alka-Seltzer plus Cold Medicine Cosprin Ketoprofen S-A-C Tablets  Anacin Analgesic Tablets or Capsules Coumadin Korlgesic Salflex  Anacin Extra Strength Analgesic tablets or capsules CP-2 Tablets Lanoril Salicylate  Anaprox Cuprimine Capsules Levenox Salocol  Anexsia-D Dalteparin Magan Salsalate  Anodynos Darvon compound Magnesium Salicylate Sine-off  Ansaid Dasin Capsules Magsal Sodium Salicylate  Anturane Depen Capsules Marnal Soma  APF Arthritis pain formula Dewitt's Pills Measurin Stanback  Argesic Dia-Gesic Meclofenamic Sulfinpyrazone  Arthritis Bayer Timed Release Aspirin Diclofenac Meclomen Sulindac  Arthritis pain formula Anacin Dicumarol Medipren Supac  Analgesic (Safety coated) Arthralgen Diffunasal Mefanamic Suprofen  Arthritis Strength Bufferin Dihydrocodeine Mepro Compound Suprol   Arthropan liquid Dopirydamole Methcarbomol with Aspirin Synalgos  ASA tablets/Enseals Disalcid Micrainin Tagament  Ascriptin Doan's Midol Talwin  Ascriptin A/D Dolene Mobidin Tanderil  Ascriptin Extra Strength Dolobid Moblgesic Ticlid  Ascriptin with Codeine Doloprin or Doloprin with Codeine Momentum Tolectin  Asperbuf Duoprin Mono-gesic Trendar  Aspergum Duradyne Motrin or Motrin IB Triminicin  Aspirin plain, buffered or enteric coated Durasal Myochrisine Trigesic  Aspirin Suppositories Easprin Nalfon Trillsate  Aspirin with Codeine Ecotrin Regular or Extra Strength Naprosyn Uracel  Atromid-S Efficin Naproxen Ursinus  Auranofin Capsules Elmiron Neocylate Vanquish  Axotal Emagrin Norgesic Verin  Azathioprine Empirin or Empirin with Codeine Normiflo Vitamin E  Azolid Emprazil Nuprin Voltaren  Bayer Aspirin plain, buffered or children's or timed BC Tablets or powders Encaprin Orgaran Warfarin Sodium  Buff-a-Comp Enoxaparin Orudis Zorpin  Buff-a-Comp with Codeine Equegesic Os-Cal-Gesic   Buffaprin Excedrin plain, buffered or Extra Strength Oxalid   Bufferin Arthritis Strength Feldene Oxphenbutazone   Bufferin plain or Extra Strength Feldene Capsules Oxycodone with Aspirin   Bufferin with Codeine Fenoprofen Fenoprofen Pabalate or Pabalate-SF   Buffets II Flogesic Panagesic   Buffinol plain or Extra Strength Florinal or Florinal with Codeine Panwarfarin   Buf-Tabs Flurbiprofen Penicillamine   Butalbital Compound Four-way cold tablets Penicillin   Butazolidin Fragmin Pepto-Bismol   Carbenicillin Geminisyn Percodan   Carna Arthritis Reliever Geopen Persantine   Carprofen Gold's salt Persistin   Chloramphenicol Goody's Phenylbutazone   Chloromycetin Haltrain Piroxlcam   Clmetidine heparin Plaquenil   Cllnoril Hyco-pap Ponstel   Clofibrate Hydroxy chloroquine Propoxyphen         Before stopping any of these medications, be sure to consult the physician who ordered them.  Some, such as  Coumadin (Warfarin) are ordered to prevent or treat serious conditions such as "deep thrombosis", "pumonary embolisms", and other heart problems.  The amount of time that you may need off of the medication may also vary with the medication and the reason for which you were taking it.  If you are taking any of these medications, please make sure you notify your pain physician before you undergo any procedures.

## 2015-10-01 NOTE — Progress Notes (Signed)
Subjective:    Patient ID: [name], female    DOB: 1956/03/25, 59 y.o.   MRN: [id number]  HPI  Patient is 59 year old female who [facility] evaluation and treatment of pain involving the lumbar and lower extremity region. Patient states that she had tremendous relief of pain following previous lumbar epidural steroid injection. Patient states that she has had return of pain of severe degree with pain radiating from the lumbar region to the lower extremity on the right. Patient stated that she is is with severely disabling pain aggravated by standing walking. Patient states that the pain interferes with all activities of daily living as well as ability to obtain restful sleep. We discussed patient's condition and will consider patient for lumbosacral selective nerve root block to be performed at time return appointment in attempt to decrease severity of patient's symptoms, minimize progression of symptoms, and avoid need for more involved treatment. The patient was in agreement with suggested treatment plan. The patient denies trauma change in events of daily living the call significant change in symptomatology and stated that the right lower extremity pain was excruciating and was becoming more intense. We discussed surgical evaluation as well which patient referred toward at this time      Review of Systems     Objective:   Physical Exam  There was tenderness to palpation over the paraspinal musculature region cervical region cervical facet region of mild degree with mild tenderness of the splenius capitis and occipitalis musculature regions. There appeared to be mild tends to palpation of the cervical facet cervical paraspinal musculature region. There was mild tends to palpation of the thoracic facet thoracic paraspinal musculature region. Patient appeared to be with slightly decreased grip strength Tinel and Phalen's maneuver were without increase of  pain of significant degree. There was no crepitus of the thoracic region noted. Palpation over the region of the lumbar paraspinal muscular region lumbar facet region reproduced severe disabling pain right greater than the left. Lateral bending rotation extension and palpation of the lumbar facets reproduce severe pain right greater than left. There was tenderness to palpation of the gluteal and piriformis musculature region of moderately severe degree on the right greater than left. There was mild to moderate tends to palpation of the PSIS and PII S region as well as the greater trochanteric region. Palpation of the lumbar facet lumbar paraspinal muscular region gluteal and piriformis musculature region reproduced severe disabling pain for performed on the right. Straight leg raising was tolerates approximately 20 with questionable decreased EHL strength. No definite sensory deficit of dermatomal distribution detected. DTRs were difficult to elicit patient had difficulty relaxing. There was negative clonus negative Homans. Abdomen was nontender with no costovertebral angle tenderness noted.    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 Multilevel facet arthropathy at L2-3, L3-4, L4-5, with disc space narrowing as well Disc desiccation at T12-L1 to L4-L5  Lumbar facet syndrome  Lumbar radiculopathy  Lumbar stenosis    PLAN  1. Medications: We will continue presently prescribed medications. 2. Lumbosacral selective nerve root block to be performed at time return appointment 3. The patient is to follow-up with primary care physician Dr. [name] regarding blood pressure and general medical condition  4. Surgical evaluation has been addressed. 5. Neurological evaluation with  [facility] EMG studies to be considered. 6. The patient may be a candidate for radiofrequency procedures, implantation device, and  other treatment pending response to treatment and  follow-up evaluation. 7. The patient has been advised to adhere to proper body mechanics and avoid activities which appear to aggravate condition. 8. The patient has been advised to call the [facility] prior to scheduled return appointment should there be significant change in condition or should there be sign

## 2015-10-05 ENCOUNTER — Other Ambulatory Visit: Payer: Self-pay | Admitting: Pain Medicine

## 2015-10-07 ENCOUNTER — Ambulatory Visit: Payer: BLUE CROSS/BLUE SHIELD | Attending: Pain Medicine | Admitting: Pain Medicine

## 2015-10-07 VITALS — BP 125/70 | HR 81 | Temp 97.7°F | Resp 16 | Ht 63.0 in | Wt 265.0 lb

## 2015-10-07 DIAGNOSIS — M47816 Spondylosis without myelopathy or radiculopathy, lumbar region: Secondary | ICD-10-CM

## 2015-10-07 DIAGNOSIS — M545 Low back pain: Secondary | ICD-10-CM | POA: Diagnosis present

## 2015-10-07 DIAGNOSIS — M1288 Other specific arthropathies, not elsewhere classified, other specified site: Secondary | ICD-10-CM | POA: Insufficient documentation

## 2015-10-07 DIAGNOSIS — M533 Sacrococcygeal disorders, not elsewhere classified: Secondary | ICD-10-CM

## 2015-10-07 DIAGNOSIS — M5136 Other intervertebral disc degeneration, lumbar region: Secondary | ICD-10-CM | POA: Diagnosis not present

## 2015-10-07 DIAGNOSIS — Z981 Arthrodesis status: Secondary | ICD-10-CM

## 2015-10-07 DIAGNOSIS — M170 Bilateral primary osteoarthritis of knee: Secondary | ICD-10-CM

## 2015-10-07 DIAGNOSIS — M48062 Spinal stenosis, lumbar region with neurogenic claudication: Secondary | ICD-10-CM

## 2015-10-07 MED ORDER — FENTANYL CITRATE (PF) 100 MCG/2ML IJ SOLN
INTRAMUSCULAR | Status: AC
  Administered 2015-10-07: 100 ug via INTRAVENOUS
  Filled 2015-10-07: qty 2

## 2015-10-07 MED ORDER — ORPHENADRINE CITRATE 30 MG/ML IJ SOLN
INTRAMUSCULAR | Status: AC
  Administered 2015-10-07: 60 mg
  Filled 2015-10-07: qty 2

## 2015-10-07 MED ORDER — BUPIVACAINE HCL (PF) 0.25 % IJ SOLN
INTRAMUSCULAR | Status: AC
  Administered 2015-10-07: 30 mL
  Filled 2015-10-07: qty 30

## 2015-10-07 MED ORDER — TRIAMCINOLONE ACETONIDE 40 MG/ML IJ SUSP
INTRAMUSCULAR | Status: AC
  Administered 2015-10-07: 40 mg
  Filled 2015-10-07: qty 1

## 2015-10-07 MED ORDER — CIPROFLOXACIN IN D5W 400 MG/200ML IV SOLN
INTRAVENOUS | Status: AC
  Administered 2015-10-07: 400 mg via INTRAMUSCULAR
  Filled 2015-10-07: qty 200

## 2015-10-07 MED ORDER — MIDAZOLAM HCL 5 MG/5ML IJ SOLN
INTRAMUSCULAR | Status: AC
  Administered 2015-10-07: 5 mg via INTRAVENOUS
  Filled 2015-10-07: qty 5

## 2015-10-07 MED ORDER — CIPROFLOXACIN HCL 250 MG PO TABS: 250.0000 mg | ORAL_TABLET | Freq: Two times a day (BID) | ORAL | Status: DC

## 2015-10-07 NOTE — Progress Notes (Signed)
   Subjective:    Patient ID: [name], female    DOB: 1956/08/15, 59 y.o.   MRN: [id number]  HPI  PROCEDURE PERFORMED: Lumbosacral selective nerve root block   NOTE: The patient is a 59 y.o. female who returns to [facility] for further evaluation and treatment of pain involving the lumbar and lower extremity region. 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 There is concern regarding intraspinal abnormalities contributing to the patient's symptomatology. The risks, benefits, and expectations of the procedure have been explained to the patient who was understanding and in agreement with suggested treatment plan. We will proceed with interventional treatment as discussed and as explained to the patient. The patient is understanding and in agreement with suggested treatment plan.   DESCRIPTION OF PROCEDURE: Lumbosacral selective nerve root block with IV Versed, IV fentanyl conscious sedation, EKG, blood pressure, pulse, and pulse oximetry monitoring. The procedure was performed with the patient in the prone position under fluoroscopic guidance. With the patient in the prone position, Betadine prep of proposed entry site was performed. Local anesthetic skin wheal of proposed needle entry site was prepared with 1.5% plain lidocaine with AP view of the lumbosacral spine.   PROCEDURE #1: Needle placement at the right L 2 vertebral body: A 22 -gauge needle was inserted at the inferior border of the transverse process of the vertebral body with needle placed medial to the midline of the transverse process on AP view of the lumbosacral spine.   NEEDLE PLACEMENT AT  L3,  L4 and L5  VERTEBRAL BODY LEVELS  Needle  placement was accomplished at L 3, L4, and L5  vertebral body  levels on the right side exactly as was accomplished at the L2  vertebral body level  and utilizing the same technique and under fluoroscopic guidance.    Needle placement was then verified on lateral view at all levels with needle tip documented to be in the posterior superior quadrant of the intervertebral foramen of  L 2, L3, L4, and L5. Following negative aspiration for heme and CSF at each level, each level was injected with 3 mL of 0.25% bupivacaine with Kenalog.    The patient tolerated the procedure well. A total of 10 mg of Kenalog was utilized for the procedure.   PLAN:  1. Medications: Will continue presently prescribed medication hydrocodone and acetaminophen 2. The patient is to undergo follow-up evaluation with PCP C. [name] for evaluation of blood pressure and general medical condition status post procedure performed o n today's visit. 3. Surgical follow-up evaluation. May consider 4. Neurological evaluation. May consider [facility] EMG studies of the studies 5. May consider radiofrequency procedures, implantation type procedures and other treatment pending response to treatment and follow-up evaluation. 6. The patient has been advise do adhere to proper body mechanics and avoid activities which may aggravate condition. 7. The patient has been advised to call the [facility] prior to scheduled return appointment should there be significant change in the patient's condition or should the patient have other concerns regarding condition prior to scheduled return appointment.     Review of Systems     Objective:   Physical Exam        Assessment & Plan:

## 2015-10-07 NOTE — Progress Notes (Signed)
Safety precautions to be maintained throughout the outpatient stay will include: orient to surroundings, keep bed in low position, maintain call bell within reach at all times, provide assistance with transfer out of bed and ambulation.  

## 2015-10-07 NOTE — Patient Instructions (Addendum)
PLAN  Continue present medication and begin taking antibiotic Cipro as prescribed. Please obtain your antibiotic today and begin taking antibiotic Cipro today  F/U PCP Dr. [name] for evaliation of  BP and general medical  condition.  F/U surgical evaluation. May consider pending follow-up evaluations  F/U neurological evaluation. May consider pending follow-up evaluations  May consider radiofrequency rhizolysis or intraspinal procedures pending response to present treatment and F/U evaluation.  Patient to call Pain Management Center should patient have concerns prior to scheduled return appointment. Pain Management Discharge Instructions  General Discharge Instructions :  If you need to reach your doctor call: Monday-Friday 8:00 am - 4:00 pm at [phone] or toll free [phone].  After clinic hours [phone] to have operator reach doctor.  Bring all of your medication bottles to all your appointments in the pain clinic.  To cancel or reschedule your appointment with Pain Management please remember to call 24 hours in advance to avoid a fee.  Refer to the educational materials which you have been given on: General Risks, I had my Procedure. Discharge Instructions, Post Sedation.  Post Procedure Instructions:  The drugs you were given will stay in your system until tomorrow, so for the next 24 hours you should not drive, make any legal decisions or drink any alcoholic beverages.  You may eat anything you prefer, but it is better to start with liquids then soups and crackers, and gradually work up to solid foods.  Please notify your doctor immediately if you have any unusual bleeding, trouble breathing or pain that is not related to your normal pain.  Depending on the type of procedure that was done, some parts of your body may feel week and/or numb.  This usually clears up by tonight or the next day.  Walk with the use of an assistive device or accompanied by an adult for  the 24 hours.  You may use ice on the affected area for the first 24 hours.  Put ice in a Ziploc bag and cover with a towel and place against area 15 minutes on 15 minutes off.  You may switch to heat after 24 hours.Selective Nerve Root Block Patient Information  Description: Specific nerve roots exit the spinal canal and these nerves can be compressed and inflamed by a bulging disc and bone spurs.  By injecting steroids on the nerve root, we can potentially decrease the inflammation surrounding these nerves, which often leads to decreased pain.  Also, by injecting local anesthesia on the nerve root, this can provide [place] helpful information to give to your referring doctor if it decreases your pain.  Selective nerve root blocks can be done along the spine from the neck to the low back depending on the location of your pain.   After numbing the skin with local anesthesia, a small needle is passed to the nerve root and the position of the needle is verified using x-ray pictures.  After the needle is in correct position, we then deposit the medication.  You may experience a pressure sensation while this is being done.  The entire block usually lasts less than 15 minutes.  Conditions that may be treated with selective nerve root blocks:  Low back and leg pain  Spinal stenosis  Diagnostic block prior to potential surgery  Neck and arm pain  Post laminectomy syndrome  Preparation for the injection:  1. Do not eat any solid food or dairy products within 6 hours of your appointment. 2. You may drink clear liquids  up to 2 hours before an appointment.  Clear liquids include water, black coffee, juice or soda.  No milk or cream please. 3. You may take your regular medications, including pain medications, with a sip of water before your appointment.  Diabetics should hold regular insulin (if taken separately) and take 1/2 normal NPH dose the morning of the procedure.  Carry some sugar containing items  with you to your appointment. 4. A driver must accompany you and be prepared to drive you home after your procedure. 5. Bring all your current medications with you. 6. An IV may be inserted and sedation may be given at the discretion of the physician. 7. A blood pressure cuff, EKG, and other monitors will often be applied during the procedure.  Some patients may need to have extra oxygen administered for a short period. 8. You will be asked to provide medical information, including allergies, prior to the procedure.  We must know immediately if you are taking blood  Thinners (like Coumadin) or if you are allergic to IV iodine contrast (dye).  Possible side-effects: All are usually temporary  Bleeding from needle site  Light headedness  Numbness and tingling  Decreased blood pressure  Weakness in arms/legs  Pressure sensation in back/neck  Pain at injection site (several days)  Possible complications: All are extremely rare  Infection  Nerve injury  Spinal headache (a headache wore with upright position)  Call if you experience:  Fever/chills associated with headache or increased back/neck pain  Headache worsened by an upright position  New onset weakness or numbness of an extremity below the injection site  Hives or difficulty breathing (go to the emergency room)  Inflammation or drainage at the injection site(s)  Severe back/neck pain greater than usual  New symptoms which are concerning to you  Please note:  Although the local anesthetic injected can often make your back or neck feel good for several hours after the injection the pain will likely return.  It takes 3-5 days for steroids to work on the nerve root. You may not notice any pain relief for at least one week.  If effective, we will often do a series of 3 injections spaced 3-6 weeks apart to maximally decrease your pain.    If you have any questions, please call [phone] [facility]

## 2015-10-08 ENCOUNTER — Telehealth: Payer: Self-pay | Admitting: *Deleted

## 2015-10-08 NOTE — Telephone Encounter (Signed)
Message left

## 2015-10-21 ENCOUNTER — Other Ambulatory Visit: Payer: Self-pay | Admitting: Pain Medicine

## 2015-10-28 ENCOUNTER — Encounter: Payer: Self-pay | Admitting: Unknown Physician Specialty

## 2015-10-28 ENCOUNTER — Ambulatory Visit (INDEPENDENT_AMBULATORY_CARE_PROVIDER_SITE_OTHER): Payer: BLUE CROSS/BLUE SHIELD | Admitting: Unknown Physician Specialty

## 2015-10-28 VITALS — BP 137/77 | HR 105 | Temp 97.5°F | Ht 63.0 in | Wt 270.8 lb

## 2015-10-28 DIAGNOSIS — F410 Panic disorder [episodic paroxysmal anxiety] without agoraphobia: Secondary | ICD-10-CM

## 2015-10-28 DIAGNOSIS — Z23 Encounter for immunization: Secondary | ICD-10-CM

## 2015-10-28 DIAGNOSIS — I10 Essential (primary) hypertension: Secondary | ICD-10-CM

## 2015-10-28 DIAGNOSIS — E785 Hyperlipidemia, unspecified: Secondary | ICD-10-CM

## 2015-10-28 DIAGNOSIS — F329 Major depressive disorder, single episode, unspecified: Secondary | ICD-10-CM | POA: Diagnosis not present

## 2015-10-28 DIAGNOSIS — Z720 Tobacco use: Secondary | ICD-10-CM

## 2015-10-28 MED ORDER — BUSPIRONE HCL 10 MG PO TABS: 10.0000 mg | ORAL_TABLET | Freq: Two times a day (BID) | ORAL | Status: DC

## 2015-10-28 MED ORDER — CLONAZEPAM 1 MG PO TABS: 1.0000 mg | ORAL_TABLET | Freq: Two times a day (BID) | ORAL | Status: DC

## 2015-10-28 MED ORDER — LOVASTATIN 40 MG PO TABS: 40.0000 mg | ORAL_TABLET | Freq: Every day | ORAL | Status: DC

## 2015-10-28 MED ORDER — OMEPRAZOLE 40 MG PO CPDR: 40.0000 mg | DELAYED_RELEASE_CAPSULE | Freq: Every day | ORAL | Status: DC

## 2015-10-28 MED ORDER — DULOXETINE HCL 60 MG PO CPEP
60.0000 mg | ORAL_CAPSULE | Freq: Every day | ORAL | Status: DC
Start: 2015-10-28 — End: 2016-05-23

## 2015-10-28 NOTE — Assessment & Plan Note (Signed)
Discussed quitting smoking

## 2015-10-28 NOTE — Assessment & Plan Note (Signed)
Stable, continue present medications.   

## 2015-10-28 NOTE — Progress Notes (Signed)
BP 137/77 mmHg  Pulse 105  Temp(Src) 97.5 F (36.4 C)  Ht 5\' 3"  (1.6 m)  Wt 270 lb 12.8 oz (122.834 kg)  BMI 47.98 kg/m2  SpO2 95%  LMP  (LMP Unknown)   Subjective:    Patient ID: [name], female    DOB: [birth year], 59 y.o.   MRN: [id number]  HPI: [name] is a 59 y.o. female  Chief Complaint  Patient presents with  . Medication Refill    pt states she needs refills on buspirone 10 mg, Valsartan-hctz, clonazepam, duloxetine, lovastatin, and omeprazole.   Low back pain "I'm doing better." She is seeing Dr. Josefa Half who will take over her medications in one month. He gave her an epidural which helped quite a bit. Taking both Gabapentin and Duloxetine.   Panic attacks/depression States she has a history of bad panic attacks. She talks herself out of them, but has not gotten counseling for them. Takes Clonazepam 1 mg BID cut back from TID at my recommendation.   Depression is stable despite her father being ill.   Depression screen [facility] 2/9 10/28/2015 10/07/2015 09/02/2015 07/17/2015 07/11/2015  Decreased Interest 1 0 0 0 0  Down, Depressed, Hopeless 1 0 0 0 0  PHQ - 2 Score 2 0 0 0 0  Altered sleeping - - - - -  Tired, decreased energy - - - - -  Change in appetite - - - - -  Feeling bad or failure about yourself  - - - - -  Trouble concentrating - - - - -  Suicidal thoughts - - - - -  PHQ-9 Score - - - - -      Hypertension Using medications without difficulty Average home BPs   No problems or lightheadedness No chest pain with exertion or shortness of breath No Edema   Hyperlipidemia Using medications without problems: No Muscle aches  Diet compliance: Exercise:  GERD Stable on current medications  Relevant past medical, surgical, family and social history reviewed and updated as indicated. Interim medical history since our last visit reviewed. Allergies and medications reviewed and updated.  Review of Systems  Per HPI unless  specifically indicated above     Objective:    BP 137/77 mmHg  Pulse 105  Temp(Src) 97.5 F (36.4 C)  Ht 5\' 3"  (1.6 m)  Wt 270 lb 12.8 oz (122.834 kg)  BMI 47.98 kg/m2  SpO2 95%  LMP  (LMP Unknown)  Wt Readings from Last 3 Encounters:  10/28/15 270 lb 12.8 oz (122.834 kg)  10/07/15 265 lb (120.203 kg)  10/01/15 265 lb (120.203 kg)    Physical Exam  Constitutional: She is oriented to person, place, and time. She appears well-developed and well-nourished. No distress.  HENT:  Head: Normocephalic and atraumatic.  Eyes: Conjunctivae and lids are normal. Right eye exhibits no discharge. Left eye exhibits no discharge. No scleral icterus.  Cardiovascular: Normal rate, regular rhythm and normal heart sounds.   Pulmonary/Chest: Effort normal.  Abdominal: Normal appearance. There is no splenomegaly or hepatomegaly.  Musculoskeletal: Normal range of motion.  Neurological: She is alert and oriented to person, place, and time.  Skin: Skin is intact. No rash noted. No pallor.  Psychiatric: She has a normal mood and affect. Her behavior is normal. Judgment and thought content normal.    Results for orders placed or performed in visit on 07/26/15  Comprehensive metabolic panel  Result Value Ref Range   Glucose 91 65 - 99 mg/dL  BUN 13 6 - 24 mg/dL   Creatinine, Ser 0.88 0.57 - 1.00 mg/dL   GFR calc non Af Amer 72 >59 mL/min/1.73   GFR calc Af Amer 83 >59 mL/min/1.73   BUN/Creatinine Ratio 15 9 - 23   Sodium 143 134 - 144 mmol/L   Potassium 4.5 3.5 - 5.2 mmol/L   Chloride 101 97 - 108 mmol/L   CO2 27 18 - 29 mmol/L   Calcium 9.4 8.7 - 10.2 mg/dL   Total Protein 6.5 6.0 - 8.5 g/dL   Albumin 3.8 3.5 - 5.5 g/dL   Globulin, Total 2.7 1.5 - 4.5 g/dL   Albumin/Globulin Ratio 1.4 1.1 - 2.5   Bilirubin Total <0.2 0.0 - 1.2 mg/dL   Alkaline Phosphatase 90 39 - 117 IU/L   AST 18 0 - 40 IU/L   ALT 23 0 - 32 IU/L  Lipid Panel w/o Chol/HDL Ratio  Result Value Ref Range   Cholesterol,  Total 194 100 - 199 mg/dL   Triglycerides 189 (H) 0 - 149 mg/dL   HDL 42 >39 mg/dL   VLDL Cholesterol Cal 38 5 - 40 mg/dL   LDL Calculated 114 (H) 0 - 99 mg/dL  Hepatitis C antibody  Result Value Ref Range   Hep C Virus Ab 0.1 0.0 - 0.9 s/co ratio  HIV antibody  Result Value Ref Range   HIV Screen 4th Generation wRfx Non Reactive Non Reactive  Microalbumin, Urine Waived  Result Value Ref Range   Microalb, Ur Waived 10 0 - 19 mg/L   Creatinine, Urine Waived 50 10 - 300 mg/dL   Microalb/Creat Ratio <30 <30 mg/g  Uric acid  Result Value Ref Range   Uric Acid 7.3 (H) 2.5 - 7.1 mg/dL      Assessment & Plan:   Problem List Items Addressed This Visit      Unprioritized   Panic disorder    Stable, continue present medications.       Relevant Medications   busPIRone (BUSPAR) 10 MG tablet   DULoxetine (CYMBALTA) 60 MG capsule   Hyperlipidemia    Labs 3 months ago stable.        Relevant Medications   lovastatin (MEVACOR) 40 MG tablet   Major depression, chronic (HCC)    Stable, continue present medications.       Relevant Medications   busPIRone (BUSPAR) 10 MG tablet   DULoxetine (CYMBALTA) 60 MG capsule   Tobacco abuse    Discussed quitting smoking      Benign hypertension    Stable, continue present medications.       Relevant Medications   lovastatin (MEVACOR) 40 MG tablet    Other Visit Diagnoses    Immunization due    -  Primary    Relevant Orders    Flu Vaccine QUAD 36+ mos IM (Completed)        Follow up plan: Return in about 3 months (around 01/28/2016) for for labs at that time.  Marland Kitchen

## 2015-10-28 NOTE — Assessment & Plan Note (Signed)
Labs 3 months ago stable.

## 2015-10-30 ENCOUNTER — Ambulatory Visit: Payer: BLUE CROSS/BLUE SHIELD | Attending: Pain Medicine | Admitting: Pain Medicine

## 2015-10-30 ENCOUNTER — Encounter: Payer: Self-pay | Admitting: Pain Medicine

## 2015-10-30 VITALS — BP 125/50 | HR 99 | Temp 98.1°F | Resp 16 | Ht 63.0 in | Wt 270.0 lb

## 2015-10-30 DIAGNOSIS — M1288 Other specific arthropathies, not elsewhere classified, other specified site: Secondary | ICD-10-CM | POA: Insufficient documentation

## 2015-10-30 DIAGNOSIS — M79605 Pain in left leg: Secondary | ICD-10-CM | POA: Diagnosis present

## 2015-10-30 DIAGNOSIS — M5116 Intervertebral disc disorders with radiculopathy, lumbar region: Secondary | ICD-10-CM | POA: Diagnosis not present

## 2015-10-30 DIAGNOSIS — M47816 Spondylosis without myelopathy or radiculopathy, lumbar region: Secondary | ICD-10-CM

## 2015-10-30 DIAGNOSIS — M4806 Spinal stenosis, lumbar region: Secondary | ICD-10-CM | POA: Insufficient documentation

## 2015-10-30 DIAGNOSIS — M48062 Spinal stenosis, lumbar region with neurogenic claudication: Secondary | ICD-10-CM

## 2015-10-30 DIAGNOSIS — M79604 Pain in right leg: Secondary | ICD-10-CM | POA: Diagnosis present

## 2015-10-30 DIAGNOSIS — M170 Bilateral primary osteoarthritis of knee: Secondary | ICD-10-CM

## 2015-10-30 DIAGNOSIS — Z981 Arthrodesis status: Secondary | ICD-10-CM

## 2015-10-30 DIAGNOSIS — M5416 Radiculopathy, lumbar region: Secondary | ICD-10-CM

## 2015-10-30 DIAGNOSIS — M545 Low back pain: Secondary | ICD-10-CM | POA: Diagnosis present

## 2015-10-30 DIAGNOSIS — M533 Sacrococcygeal disorders, not elsewhere classified: Secondary | ICD-10-CM

## 2015-10-30 MED ORDER — HYDROCODONE-ACETAMINOPHEN 10-325 MG PO TABS: ORAL_TABLET | ORAL | Status: DC

## 2015-10-30 MED ORDER — CARISOPRODOL 350 MG PO TABS: ORAL_TABLET | ORAL | Status: DC

## 2015-10-30 NOTE — Patient Instructions (Addendum)
PLAN   Continue present medications Soma and hydrocodone acetaminophen  F/U PCP [name] for evaliation of  BP and general medical  condition including lower extremity swelling as discussed on today's visit  F/U surgical evaluation. Please ask receptionist date of neurosurgical evaluation  TENS unit. Please ask receptionist how you will get your TENS unit  F/U neurological evaluation. May consider pending follow-up evaluations  May consider radiofrequency rhizolysis or intraspinal procedures pending response to present treatment and F/U evaluation   Patient to call Pain Management Center should patient have concerns prior to scheduled return appointment.

## 2015-10-30 NOTE — Progress Notes (Signed)
   Subjective:    Patient ID: [name], female    DOB: Nov 13, 1956, 59 y.o.   MRN: [id number]  HPI  Decision was made to prescribe TENS unit for patient on today's visit in attempt to decrease pain of the lumbar and lower extremity region  Review of Systems     Objective:   Physical Exam        Assessment & Plan:

## 2015-10-30 NOTE — Progress Notes (Signed)
   Subjective:    Patient ID: [name], female    DOB: [birth year], 59 y.o.   MRN: [id number]  HPI  The patient is a 59 year old female who returns to pain management Center for further evaluation and treatment of pain involving the lower back and lower extremity region. The patient has had improvement of her pain with prior interventional treatment performed in pain management Center. At the present time we will avoid additional interventional treatment and will have patient undergo reevaluation with [name] for further assessment of patient's lower extremity swelling. We will avoid interventional treatment at this time until patient's lower extremity swelling can be optimally controlled. We will continue medications consisting of hydrocodone acetaminophen and Soma and we'll remain available to consider modification of treatment pending response to treatment and follow-up evaluation. We will also encourage patient to undergo surgical evaluation for assessment of her condition and recommendations regarding treatment of her condition. Patient prefers to avoid surgical intervention. We encouraged patient to proceed with surgical evaluation for education of her condition and for a better understanding of how to minimize injuring herself and hopefully retard progression of her condition. The patient will proceed with surgical evaluation as discussed and will follow-up with primary care physician [name] for further assessment of lower extremity swelling of her evaluation treatment and general medical condition. The patient was understanding and agreed to suggested treatment plan      Review of Systems     Objective:   Physical Exam  There was tends to palpation of the paraspinal must reason cervical and cervical facet region a mild degree there was mild tenderness of the splenius capitis and occipitalis musculature regions. There was tends to palpation of the acromioclavicular and  glenohumeral joint region a mild degree. No crepitus of the thoracic region noted. There was tenderness over the region of the thoracic facet thoracic paraspinal musculature in the left as well as on the right. Lateral bending rotation extension and palpation of the lumbar facets reproduce moderate discomfort. There was moderate tenderness to palpation over the PSIS and PII S region as well as the gluteal and piriformis musculature regions. Straight leg raising was decreased on the right compared to the left EHL strength appeared to be decreased. There was negative clonus negative Homans. No definite sensory deficit or dermatomal speech detected. There was tends to palpation over the greater trochanteric region iliotibial band region on the right greater than the left. There was tends to palpation of the knees with negative anterior and posterior drawer signs without ballottement of the patella. Abdomen nontender and no costovertebral tenderness noted.      Assessment & Plan:     Degenerative disc disease lumbar spine Most predominant at L4-5. Multilevel facet arthropathy at L2-3, L3-4, L4-5, with disc space narrowing as well Disc desiccation at T12-L1 to L4-L5  Lumbar facet syndrome  Lumbar radiculopathy  Lumbar stenosis    PLAN  Continue present medications Soma and hydrocodone acetaminophen  F/U PCP [name] for evaliation of  BP and general medical  condition including lower extremity swelling as discussed on today's visit  F/U surgical evaluation. Please ask receptionist date of neurosurgical evaluation  TENS unit. Please ask receptionist how you will get your TENS unit  F/U neurological evaluation. May consider pending follow-up evaluations  May consider radiofrequency rhizolysis or intraspinal procedures pending response to present treatment and F/U evaluation   Patient to call Pain Management Center should patient have concerns prior to scheduled return appointment.

## 2015-11-01 ENCOUNTER — Ambulatory Visit: Payer: BLUE CROSS/BLUE SHIELD | Admitting: Unknown Physician Specialty

## 2015-11-08 ENCOUNTER — Encounter: Payer: Self-pay | Admitting: Unknown Physician Specialty

## 2015-11-08 ENCOUNTER — Ambulatory Visit (INDEPENDENT_AMBULATORY_CARE_PROVIDER_SITE_OTHER): Payer: BLUE CROSS/BLUE SHIELD | Admitting: Unknown Physician Specialty

## 2015-11-08 ENCOUNTER — Ambulatory Visit
Admission: RE | Admit: 2015-11-08 | Discharge: 2015-11-08 | Disposition: A | Discharge status: Home / Self Care | Payer: BLUE CROSS/BLUE SHIELD | Source: Ambulatory Visit | Attending: Unknown Physician Specialty | Admitting: Unknown Physician Specialty

## 2015-11-08 VITALS — BP 125/72 | HR 82 | Temp 98.5°F | Ht 62.3 in | Wt 274.0 lb

## 2015-11-08 DIAGNOSIS — R600 Localized edema: Secondary | ICD-10-CM

## 2015-11-08 DIAGNOSIS — I10 Essential (primary) hypertension: Secondary | ICD-10-CM

## 2015-11-08 DIAGNOSIS — R05 Cough: Secondary | ICD-10-CM | POA: Insufficient documentation

## 2015-11-08 MED ORDER — FUROSEMIDE 20 MG PO TABS: 20.0000 mg | ORAL_TABLET | Freq: Every day | ORAL | Status: DC

## 2015-11-08 NOTE — Addendum Note (Signed)
Addended by: [name] on: 11/08/2015 10:06 AM   Modules accepted: Miquel Dunn

## 2015-11-08 NOTE — Addendum Note (Signed)
Addended by: [name] on: 11/08/2015 10:08 AM   Modules accepted: Miquel Dunn

## 2015-11-08 NOTE — Assessment & Plan Note (Signed)
Normotensive today.

## 2015-11-08 NOTE — Progress Notes (Addendum)
BP 125/72 mmHg  Pulse 82  Temp(Src) 98.5 F (36.9 C)  Ht 5' 2.3" (1.582 m)  Wt 274 lb (124.286 kg)  BMI 49.66 kg/m2  SpO2 97%  LMP  (LMP Unknown)   Subjective:    Patient ID: [name], female    DOB: 01/25/1956, 59 y.o.   MRN: [id number]  HPI: [name] is a 59 y.o. female  Chief Complaint  Patient presents with  . Hypertension  . Foot Swelling    bilateral   Bilateral foot swelling This has been going on for 2 weeks.  This gets worse in the afternoon in which she cannot bend her toes and finds they are painful.  Denies SOB.  Last labs were in August and WNL.  BP good today but elevated with Dr. Josefa Half and he refused any procedures.    Relevant past medical, surgical, family and social history reviewed and updated as indicated. Interim medical history since our last visit reviewed. Allergies and medications reviewed and updated.  Review of Systems  Per HPI unless specifically indicated above     Objective:    BP 125/72 mmHg  Pulse 82  Temp(Src) 98.5 F (36.9 C)  Ht 5' 2.3" (1.582 m)  Wt 274 lb (124.286 kg)  BMI 49.66 kg/m2  SpO2 97%  LMP  (LMP Unknown)  Wt Readings from Last 3 Encounters:  11/08/15 274 lb (124.286 kg)  10/30/15 270 lb (122.471 kg)  10/28/15 270 lb 12.8 oz (122.834 kg)    Physical Exam  Constitutional: She is oriented to person, place, and time. She appears well-developed and well-nourished. No distress.  HENT:  Head: Normocephalic and atraumatic.  Eyes: Conjunctivae and lids are normal. Right eye exhibits no discharge. Left eye exhibits no discharge. No scleral icterus.  Neck: Normal range of motion. Neck supple. No JVD present. Carotid bruit is not present.  Cardiovascular: Normal rate, regular rhythm and normal heart sounds.   Pulmonary/Chest: Effort normal. No respiratory distress. She has wheezes.  Expiratory wheeze.  No rhonchi  Abdominal: Normal appearance. There is no splenomegaly or hepatomegaly.  Musculoskeletal:  Normal range of motion.  Neurological: She is alert and oriented to person, place, and time.  Skin: Skin is warm, dry and intact. No rash noted. No pallor.  Psychiatric: She has a normal mood and affect. Her behavior is normal. Judgment and thought content normal.    Results for orders placed or performed in visit on 07/26/15  Comprehensive metabolic panel  Result Value Ref Range   Glucose 91 65 - 99 mg/dL   BUN 13 6 - 24 mg/dL   Creatinine, Ser 0.88 0.57 - 1.00 mg/dL   GFR calc non Af Amer 72 >59 mL/min/1.73   GFR calc Af Amer 83 >59 mL/min/1.73   BUN/Creatinine Ratio 15 9 - 23   Sodium 143 134 - 144 mmol/L   Potassium 4.5 3.5 - 5.2 mmol/L   Chloride 101 97 - 108 mmol/L   CO2 27 18 - 29 mmol/L   Calcium 9.4 8.7 - 10.2 mg/dL   Total Protein 6.5 6.0 - 8.5 g/dL   Albumin 3.8 3.5 - 5.5 g/dL   Globulin, Total 2.7 1.5 - 4.5 g/dL   Albumin/Globulin Ratio 1.4 1.1 - 2.5   Bilirubin Total <0.2 0.0 - 1.2 mg/dL   Alkaline Phosphatase 90 39 - 117 IU/L   AST 18 0 - 40 IU/L   ALT 23 0 - 32 IU/L  Lipid Panel w/o Chol/HDL Ratio  Result Value  Ref Range   Cholesterol, Total 194 100 - 199 mg/dL   Triglycerides 189 (H) 0 - 149 mg/dL   HDL 42 >39 mg/dL   VLDL Cholesterol Cal 38 5 - 40 mg/dL   LDL Calculated 114 (H) 0 - 99 mg/dL  Hepatitis C antibody  Result Value Ref Range   Hep C Virus Ab 0.1 0.0 - 0.9 s/co ratio  HIV antibody  Result Value Ref Range   HIV Screen 4th Generation wRfx Non Reactive Non Reactive  Microalbumin, Urine Waived  Result Value Ref Range   Microalb, Ur Waived 10 0 - 19 mg/L   Creatinine, Urine Waived 50 10 - 300 mg/dL   Microalb/Creat Ratio <30 <30 mg/g  Uric acid  Result Value Ref Range   Uric Acid 7.3 (H) 2.5 - 7.1 mg/dL      Assessment & Plan:   Problem List Items Addressed This Visit      Unprioritized   Benign hypertension    Normotensive today.        Relevant Medications   furosemide (LASIX) 20 MG tablet    Other Visit Diagnoses    Pedal edema     -  Primary    Rx for L:asix.  Get chest x-ray.  Check CMP    Relevant Orders    DG Chest 2 View    Comprehensive metabolic panel         Follow up plan: Return in about 1 month (around 12/09/2015).

## 2015-11-09 LAB — COMPREHENSIVE METABOLIC PANEL WITH GFR
ALT: 13 IU/L (ref 0–32)
AST: 16 IU/L (ref 0–40)
Albumin/Globulin Ratio: 1.3 (ref 1.1–2.5)
Albumin: 3.5 g/dL (ref 3.5–5.5)
Alkaline Phosphatase: 88 IU/L (ref 39–117)
BUN/Creatinine Ratio: 10 (ref 9–23)
BUN: 9 mg/dL (ref 6–24)
Bilirubin Total: 0.2 mg/dL (ref 0.0–1.2)
CO2: 25 mmol/L (ref 18–29)
Calcium: 9.2 mg/dL (ref 8.7–10.2)
Chloride: 102 mmol/L (ref 97–106)
Creatinine, Ser: 0.92 mg/dL (ref 0.57–1.00)
GFR calc Af Amer: 79 mL/min/1.73
GFR calc non Af Amer: 68 mL/min/1.73
Globulin, Total: 2.7 g/dL (ref 1.5–4.5)
Glucose: 108 mg/dL — ABNORMAL HIGH (ref 65–99)
Potassium: 4.1 mmol/L (ref 3.5–5.2)
Sodium: 141 mmol/L (ref 136–144)
Total Protein: 6.2 g/dL (ref 6.0–8.5)

## 2015-11-12 ENCOUNTER — Ambulatory Visit
Admission: RE | Admit: 2015-11-12 | Discharge: 2015-11-12 | Disposition: A | Discharge status: Home / Self Care | Payer: BLUE CROSS/BLUE SHIELD | Source: Ambulatory Visit | Attending: Unknown Physician Specialty | Admitting: Unknown Physician Specialty

## 2015-11-12 DIAGNOSIS — Z1231 Encounter for screening mammogram for malignant neoplasm of breast: Secondary | ICD-10-CM | POA: Diagnosis present

## 2015-11-12 DIAGNOSIS — Z0000 Encounter for general adult medical examination without abnormal findings: Secondary | ICD-10-CM

## 2015-11-28 ENCOUNTER — Ambulatory Visit: Payer: BLUE CROSS/BLUE SHIELD | Admitting: Pain Medicine

## 2015-12-09 ENCOUNTER — Ambulatory Visit: Payer: BLUE CROSS/BLUE SHIELD | Admitting: Unknown Physician Specialty

## 2015-12-13 ENCOUNTER — Other Ambulatory Visit: Payer: Self-pay | Admitting: Unknown Physician Specialty

## 2015-12-13 MED ORDER — FUROSEMIDE 20 MG PO TABS: 20.0000 mg | ORAL_TABLET | Freq: Every day | ORAL | Status: DC

## 2015-12-13 NOTE — Telephone Encounter (Signed)
Pt would like to have lasix sent to [facility] to last until next appt 01/28/16. Says it works and is keeping the swelling down, she doesn't feel like she needs to come in right now.

## 2015-12-13 NOTE — Telephone Encounter (Signed)
I can't just refill it without a f/u.  This medication needs to be monitored with regular labs and discussion about risks of chronic use.  I gave her #20 but needs to be seen further refills

## 2015-12-13 NOTE — Telephone Encounter (Signed)
Routing to provider  

## 2015-12-13 NOTE — Telephone Encounter (Signed)
Called and scheduled patient a follow up for 12/20/15.

## 2015-12-20 ENCOUNTER — Ambulatory Visit (INDEPENDENT_AMBULATORY_CARE_PROVIDER_SITE_OTHER): Payer: BLUE CROSS/BLUE SHIELD | Admitting: Unknown Physician Specialty

## 2015-12-20 ENCOUNTER — Encounter: Payer: Self-pay | Admitting: Unknown Physician Specialty

## 2015-12-20 VITALS — BP 152/80 | HR 103 | Temp 97.8°F | Ht 62.3 in | Wt 268.8 lb

## 2015-12-20 DIAGNOSIS — I10 Essential (primary) hypertension: Secondary | ICD-10-CM | POA: Diagnosis not present

## 2015-12-20 DIAGNOSIS — R600 Localized edema: Secondary | ICD-10-CM

## 2015-12-20 DIAGNOSIS — R609 Edema, unspecified: Secondary | ICD-10-CM | POA: Insufficient documentation

## 2015-12-20 DIAGNOSIS — Z23 Encounter for immunization: Secondary | ICD-10-CM | POA: Diagnosis not present

## 2015-12-20 MED ORDER — FUROSEMIDE 20 MG PO TABS: 20.0000 mg | ORAL_TABLET | Freq: Every day | ORAL | Status: DC

## 2015-12-20 NOTE — Assessment & Plan Note (Addendum)
Elevated today probably due to pain.  Recheck much approved after sitting.  Consider K after checking CMP

## 2015-12-20 NOTE — Assessment & Plan Note (Deleted)
Elevated today probably due to pain.  Recheck much approved after witting

## 2015-12-20 NOTE — Progress Notes (Signed)
-  BP 152/80 mmHg  Pulse 103  Temp(Src) 97.8 F (36.6 C)  Ht 5' 2.3" (1.582 m)  Wt 268 lb 12.8 oz (121.927 kg)  BMI 48.72 kg/m2  SpO2 95%  LMP  (LMP Unknown)   Subjective:    Patient ID: [name], female    DOB: 1956-03-09, 60 y.o.   MRN: [id number]  HPI: [name] is a 60 y.o. female  Chief Complaint  Patient presents with  . Foot Swelling    pt states she needs refill on lasix   Would like a refill of Lasix as her foot swelling is better.  She would like a refill of her Lasix.  She feels well and thinks her elevated BP is due to running out of pain meds she gets from the clinic.  No chest pain or SOB during the day or night.  Using CPAP without problems.  Relevant past medical, surgical, family and social history reviewed and updated as indicated. Interim medical history since our last visit reviewed. Allergies and medications reviewed and updated.  Review of Systems  Per HPI unless specifically indicated above     Objective:    BP 152/80 mmHg  Pulse 103  Temp(Src) 97.8 F (36.6 C)  Ht 5' 2.3" (1.582 m)  Wt 268 lb 12.8 oz (121.927 kg)  BMI 48.72 kg/m2  SpO2 95%  LMP  (LMP Unknown)  Wt Readings from Last 3 Encounters:  12/20/15 268 lb 12.8 oz (121.927 kg)  11/08/15 274 lb (124.286 kg)  10/30/15 270 lb (122.471 kg)    Physical Exam  Constitutional: She is oriented to person, place, and time. She appears well-developed and well-nourished. No distress.  HENT:  Head: Normocephalic and atraumatic.  Eyes: Conjunctivae and lids are normal. Right eye exhibits no discharge. Left eye exhibits no discharge. No scleral icterus.  Neck: Normal range of motion. Neck supple. No JVD present. Carotid bruit is not present.  Cardiovascular: Normal rate, regular rhythm and normal heart sounds.   Pulmonary/Chest: Effort normal and breath sounds normal.  Abdominal: Normal appearance. There is no splenomegaly or hepatomegaly.  Musculoskeletal: Normal range of motion.   Neurological: She is alert and oriented to person, place, and time.  Skin: Skin is warm, dry and intact. No rash noted. No pallor.  Psychiatric: She has a normal mood and affect. Her behavior is normal. Judgment and thought content normal.    Results for orders placed or performed in visit on 11/08/15  Comprehensive metabolic panel  Result Value Ref Range   Glucose 108 (H) 65 - 99 mg/dL   BUN 9 6 - 24 mg/dL   Creatinine, Ser 0.92 0.57 - 1.00 mg/dL   GFR calc non Af Amer 68 >59 mL/min/1.73   GFR calc Af Amer 79 >59 mL/min/1.73   BUN/Creatinine Ratio 10 9 - 23   Sodium 141 136 - 144 mmol/L   Potassium 4.1 3.5 - 5.2 mmol/L   Chloride 102 97 - 106 mmol/L   CO2 25 18 - 29 mmol/L   Calcium 9.2 8.7 - 10.2 mg/dL   Total Protein 6.2 6.0 - 8.5 g/dL   Albumin 3.5 3.5 - 5.5 g/dL   Globulin, Total 2.7 1.5 - 4.5 g/dL   Albumin/Globulin Ratio 1.3 1.1 - 2.5   Bilirubin Total 0.2 0.0 - 1.2 mg/dL   Alkaline Phosphatase 88 39 - 117 IU/L   AST 16 0 - 40 IU/L   ALT 13 0 - 32 IU/L      Assessment &  Plan:   Problem List Items Addressed This Visit      Unprioritized   Benign hypertension    Elevated today probably due to pain.  Recheck much approved after sitting.  Consider K after checking CMP      Relevant Medications   furosemide (LASIX) 20 MG tablet   Pedal edema    Continue Furosemide.  Check CMP.  Order cardiac echo      Relevant Orders   Echocardiogram   Comprehensive metabolic panel    Other Visit Diagnoses    Need for shingles vaccine    -  Primary    Relevant Orders    Varicella-zoster vaccine subcutaneous (Completed)        Follow up plan: Return in about 1 month (around 01/20/2016).

## 2015-12-20 NOTE — Assessment & Plan Note (Signed)
Continue Furosemide.  Check CMP.  Order cardiac echo

## 2015-12-21 LAB — COMPREHENSIVE METABOLIC PANEL
ALT: 15 IU/L (ref 0–32)
AST: 21 IU/L (ref 0–40)
Albumin/Globulin Ratio: 1.5 (ref 1.1–2.5)
Albumin: 3.8 g/dL (ref 3.6–4.8)
Alkaline Phosphatase: 87 IU/L (ref 39–117)
BUN/Creatinine Ratio: 10 — ABNORMAL LOW (ref 11–26)
BUN: 10 mg/dL (ref 8–27)
Bilirubin Total: <0.2 mg/dL (ref 0.0–1.2)
CO2: 23 mmol/L (ref 18–29)
Calcium: 9.2 mg/dL (ref 8.7–10.3)
Chloride: 100 mmol/L (ref 96–106)
Creatinine, Ser: 1 mg/dL (ref 0.57–1.00)
GFR calc Af Amer: 71 mL/min/{1.73_m2} (ref >59)
GFR calc non Af Amer: 61 mL/min/{1.73_m2} (ref >59)
Globulin, Total: 2.6 g/dL (ref 1.5–4.5)
Glucose: 114 mg/dL — ABNORMAL HIGH (ref 65–99)
Potassium: 4 mmol/L (ref 3.5–5.2)
Sodium: 140 mmol/L (ref 134–144)
Total Protein: 6.4 g/dL (ref 6.0–8.5)

## 2015-12-23 ENCOUNTER — Other Ambulatory Visit: Payer: Self-pay | Admitting: Pain Medicine

## 2015-12-23 ENCOUNTER — Ambulatory Visit: Payer: BLUE CROSS/BLUE SHIELD | Attending: Pain Medicine | Admitting: Pain Medicine

## 2015-12-23 ENCOUNTER — Encounter: Payer: Self-pay | Admitting: Pain Medicine

## 2015-12-23 VITALS — BP 119/52 | HR 62 | Temp 98.2°F | Resp 16 | Ht 63.0 in | Wt 267.0 lb

## 2015-12-23 DIAGNOSIS — M170 Bilateral primary osteoarthritis of knee: Secondary | ICD-10-CM

## 2015-12-23 DIAGNOSIS — M5136 Other intervertebral disc degeneration, lumbar region: Secondary | ICD-10-CM

## 2015-12-23 DIAGNOSIS — M5116 Intervertebral disc disorders with radiculopathy, lumbar region: Secondary | ICD-10-CM | POA: Diagnosis not present

## 2015-12-23 DIAGNOSIS — M5416 Radiculopathy, lumbar region: Secondary | ICD-10-CM

## 2015-12-23 DIAGNOSIS — M533 Sacrococcygeal disorders, not elsewhere classified: Secondary | ICD-10-CM | POA: Diagnosis not present

## 2015-12-23 DIAGNOSIS — M4806 Spinal stenosis, lumbar region: Secondary | ICD-10-CM | POA: Insufficient documentation

## 2015-12-23 DIAGNOSIS — G588 Other specified mononeuropathies: Secondary | ICD-10-CM | POA: Insufficient documentation

## 2015-12-23 DIAGNOSIS — M79606 Pain in leg, unspecified: Secondary | ICD-10-CM | POA: Diagnosis present

## 2015-12-23 DIAGNOSIS — Z9889 Other specified postprocedural states: Secondary | ICD-10-CM

## 2015-12-23 DIAGNOSIS — M1288 Other specific arthropathies, not elsewhere classified, other specified site: Secondary | ICD-10-CM | POA: Diagnosis not present

## 2015-12-23 DIAGNOSIS — M47816 Spondylosis without myelopathy or radiculopathy, lumbar region: Secondary | ICD-10-CM

## 2015-12-23 DIAGNOSIS — M545 Low back pain: Secondary | ICD-10-CM | POA: Diagnosis present

## 2015-12-23 DIAGNOSIS — Z981 Arthrodesis status: Secondary | ICD-10-CM

## 2015-12-23 DIAGNOSIS — M48062 Spinal stenosis, lumbar region with neurogenic claudication: Secondary | ICD-10-CM

## 2015-12-23 DIAGNOSIS — M51369 Other intervertebral disc degeneration, lumbar region without mention of lumbar back pain or lower extremity pain: Secondary | ICD-10-CM

## 2015-12-23 MED ORDER — HYDROCODONE-ACETAMINOPHEN 10-325 MG PO TABS: ORAL_TABLET | ORAL | Status: DC

## 2015-12-23 MED ORDER — CARISOPRODOL 350 MG PO TABS: ORAL_TABLET | ORAL | Status: DC

## 2015-12-23 NOTE — Progress Notes (Signed)
Safety precautions to be maintained throughout the outpatient stay will include: orient to surroundings, keep bed in low position, maintain call bell within reach at all times, provide assistance with transfer out of bed and ambulation.  

## 2015-12-23 NOTE — Patient Instructions (Addendum)
PLAN   Continue present medications Soma and hydrocodone acetaminophen  Cluneal and sciatic nerve block to be performed at time of return appointment  F/U PCP [name] for evaliation of  BP and general medical  condition. Please follow-up with [name] as discussed  F/U surgical evaluation. Please ask receptionist date of neurosurgical evaluation  TENS unit. Please ask receptionist how you will get your TENS unit  F/U neurological evaluation. May consider pending follow-up evaluations  May consider radiofrequency rhizolysis or intraspinal procedures pending response to present treatment and F/U evaluation   Patient to call Pain Management Center should patient have concerns prior to scheduled return appointment.  Nerve Block Patient Information  Preparation for the injection:  1. Do not eat any solid food or dairy products within 6 hours of your appointment. 2. You may drink clear liquids up to 2 hours before an appointment.  Clear liquids include water, black coffee, juice or soda.  No milk or cream please. 3. You may take your regular medications, including pain medications, with a sip of water before your appointment.  Diabetics should hold regular insulin (if taken separately) and take 1/2 normal NPH dose the morning of the procedure.  Carry some sugar containing items with you to your appointment. 4. A driver must accompany you and be prepared to drive you home after your procedure. 5. Bring all your current medications with you. 6. An IV may be inserted and sedation may be given at the discretion of the physician. 7. A blood pressure cuff, EKG, and other monitors will often be applied during the procedure.  Some patients may need to have extra oxygen administered for a short period. 8. You will be asked to provide medical information, including allergies, prior to the procedure.  We must know immediately if you are taking blood  Thinners (like Coumadin) or if you are allergic to  IV iodine contrast (dye).  Possible side-effects: All are usually temporary  Bleeding from needle site  Light headedness  Numbness and tingling  Decreased blood pressure  Weakness in arms/legs  Pressure sensation in back/neck  Pain at injection site (several days)  Possible complications: All are extremely rare  Infection  Nerve injury  Spinal headache (a headache wore with upright position)  Call if you experience:  Fever/chills associated with headache or increased back/neck pain  Headache worsened by an upright position  New onset weakness or numbness of an extremity below the injection site  Hives or difficulty breathing (go to the emergency room)  Inflammation or drainage at the injection site(s)  Severe back/neck pain greater than usual  New symptoms which are concerning to you  Please note:  Although the local anesthetic injected can often make your back or neck feel good for several hours after the injection the pain will likely return.  It takes 3-5 days for steroids to work on the nerve root. You may not notice any pain relief for at least one week.  If effective, we will often do a series of 3 injections spaced 3-6 weeks apart to maximally decrease your pain.    If you have any questions, please call [phone] [facility]

## 2015-12-23 NOTE — Progress Notes (Signed)
   Subjective:    Patient ID: [name], female    DOB: 1956-03-30, 60 y.o.   MRN: [id number]  HPI   The patient is a 60 year old female who returns to pain management for further evaluation and treatment of pain involving the lower back and lower extremity region.. The patient states that she has severe pain involving the right lower extremity. The patient recently saw her primary care physician C  [name] for her general medical condition including the lower extremity pain. We discussed patient's condition and will consider patient for cluneal and sciatic nerve block at time return appointment and will continue patient's medications of hydrocodone acetaminophen and Soma. The patient was with understanding and agreement suggested treatment plan. The patient denied any trauma change in events of daily living the call significant change in symptomatology. The patient stated the pain was severely incapacitating and that she was in hopes of being able to find some relief but significant decrease in the intensity of the pain. We will proceed with interventional treatment at time return appointment as discussed and will proceed with cluneal and sciatic nerve blocks which are felt to be medically necessary procedure in attempt to decrease severity of patient's symptoms, minimize progression of symptoms, and avoid the need for more involved treatment. The patient agreed to suggested treatment plan  Review of Systems     Objective:   Physical Exam  There was tends to palpation of the splenius capitate and occipitalis musculature region a mild degree with mild tenderness of the cervical facet cervical paraspinal musculature region. The patient appeared to be with bilaterally equal grip strength. Tinel and Phalen's maneuver were without increase of pain of significant degree. There was tenderness of the acromioclavicular and glenohumeral joint region amount degree. There was unremarkable Spurling's  maneuver. Palpation over the thoracic facet thoracic paraspinal musculature region was with mild tenderness to palpation. There was moderate tenderness to palpation over the lumbar facet lumbar paraspinal musculature region. Palpation over the gluteal and piriformis musculatures and reproduced moderate to moderately severe discomfort with EHL strength is significantly decreased. There was no definite sensory deficit or dermatomal distribution detected. There was negative clonus negative Homans. There was tends to palpation of the PSIS and PII S region a moderate degree. There was moderately severe tenderness to palpation over the gluteal and piriformis musculature regions. Palpation of these regions reproduced severe discomfort. Abdomen was nontender with no costovertebral tenderness noted.      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 Multilevel facet arthropathy at L2-3, L3-4, L4-5, with disc space narrowing as well Disc desiccation at T12-L1 to L4-L5  Lumbar facet syndrome  Lumbar radiculopathy  Lumbar stenosis  Sacroiliac joint dysfunction     PLAN   Continue present medications Soma and hydrocodone acetaminophen  Cluneal and sciatic nerve block to be performed at time of return appointment  F/U PCP [name] for evaliation of  BP and general medical  condition. Please follow-up with [name] as discussed  F/U surgical evaluation. Please ask receptionist date of neurosurgical evaluation  TENS unit. Please ask receptionist how you will get your TENS unit  F/U neurological evaluation. May consider pending follow-up evaluations  May consider radiofrequency rhizolysis or intraspinal procedures pending response to present treatment and F/U evaluation   Patient to call Pain Management Center should patient have concerns prior to scheduled return appointment.

## 2015-12-26 ENCOUNTER — Ambulatory Visit
Admission: RE | Admit: 2015-12-26 | Discharge: 2015-12-26 | Disposition: A | Discharge status: Home / Self Care | Payer: BLUE CROSS/BLUE SHIELD | Source: Ambulatory Visit | Attending: Unknown Physician Specialty | Admitting: Unknown Physician Specialty

## 2015-12-26 DIAGNOSIS — R0600 Dyspnea, unspecified: Secondary | ICD-10-CM | POA: Insufficient documentation

## 2015-12-26 DIAGNOSIS — R600 Localized edema: Secondary | ICD-10-CM

## 2015-12-26 NOTE — Progress Notes (Signed)
*  PRELIMINARY RESULTS* Echocardiogram 2D Echocardiogram has been performed.  [name] 12/26/2015, 11:41 AM

## 2015-12-27 ENCOUNTER — Other Ambulatory Visit: Payer: Self-pay | Admitting: Pain Medicine

## 2015-12-30 ENCOUNTER — Encounter: Payer: Self-pay | Admitting: Pain Medicine

## 2015-12-30 ENCOUNTER — Ambulatory Visit: Payer: BLUE CROSS/BLUE SHIELD | Attending: Pain Medicine | Admitting: Pain Medicine

## 2015-12-30 VITALS — BP 131/90 | HR 73 | Temp 98.7°F | Resp 18 | Ht 63.0 in | Wt 267.0 lb

## 2015-12-30 DIAGNOSIS — M48062 Spinal stenosis, lumbar region with neurogenic claudication: Secondary | ICD-10-CM

## 2015-12-30 DIAGNOSIS — M47816 Spondylosis without myelopathy or radiculopathy, lumbar region: Secondary | ICD-10-CM

## 2015-12-30 DIAGNOSIS — M5136 Other intervertebral disc degeneration, lumbar region: Secondary | ICD-10-CM | POA: Insufficient documentation

## 2015-12-30 DIAGNOSIS — M170 Bilateral primary osteoarthritis of knee: Secondary | ICD-10-CM

## 2015-12-30 DIAGNOSIS — M79606 Pain in leg, unspecified: Secondary | ICD-10-CM | POA: Diagnosis present

## 2015-12-30 DIAGNOSIS — M5416 Radiculopathy, lumbar region: Secondary | ICD-10-CM

## 2015-12-30 DIAGNOSIS — M545 Low back pain: Secondary | ICD-10-CM | POA: Diagnosis present

## 2015-12-30 DIAGNOSIS — Z981 Arthrodesis status: Secondary | ICD-10-CM

## 2015-12-30 DIAGNOSIS — M533 Sacrococcygeal disorders, not elsewhere classified: Secondary | ICD-10-CM

## 2015-12-30 DIAGNOSIS — M1288 Other specific arthropathies, not elsewhere classified, other specified site: Secondary | ICD-10-CM | POA: Insufficient documentation

## 2015-12-30 MED ORDER — ORPHENADRINE CITRATE 30 MG/ML IJ SOLN
INTRAMUSCULAR | Status: AC
  Administered 2015-12-30: 10:00:00
  Filled 2015-12-30: qty 2

## 2015-12-30 MED ORDER — BUPIVACAINE HCL (PF) 0.25 % IJ SOLN
INTRAMUSCULAR | Status: AC
  Administered 2015-12-30: 10:00:00
  Filled 2015-12-30: qty 30

## 2015-12-30 MED ORDER — BUPIVACAINE HCL (PF) 0.25 % IJ SOLN: 30.0000 mL | Freq: Once | INTRAMUSCULAR | Status: DC

## 2015-12-30 MED ORDER — KETOROLAC TROMETHAMINE 60 MG/2ML IM SOLN
INTRAMUSCULAR | Status: AC
  Administered 2015-12-30: 10:00:00
  Filled 2015-12-30: qty 2

## 2015-12-30 MED ORDER — ORPHENADRINE CITRATE 30 MG/ML IJ SOLN: 60.0000 mg | Freq: Once | INTRAMUSCULAR | Status: DC

## 2015-12-30 MED ORDER — SODIUM CHLORIDE 0.9 % IJ SOLN
INTRAMUSCULAR | Status: AC
  Administered 2015-12-30: 10:00:00
  Filled 2015-12-30: qty 10

## 2015-12-30 MED ORDER — SODIUM CHLORIDE 0.9% FLUSH: 20.0000 mL | Freq: Once | INTRAVENOUS | Status: DC

## 2015-12-30 NOTE — Patient Instructions (Addendum)
PLAN   Continue present medications Soma and hydrocodone acetaminophen  F/U PCP [name] for evaliation of  BP and general medical  condition. Please follow-up with [name] as discussed  F/U surgical evaluation. Please ask receptionist date of neurosurgical evaluation  TENS unit. Please ask receptionist how you will get your TENS unit  F/U neurological evaluation. May consider pending follow-up evaluations  May consider radiofrequency rhizolysis or intraspinal procedures pending response to present treatment and F/U evaluation GENERAL RISKS AND COMPLICATIONS  What are the risk, side effects and possible complications? Generally speaking, most procedures are safe.  However, with any procedure there are risks, side effects, and the possibility of complications.  The risks and complications are dependent upon the sites that are lesioned, or the type of nerve block to be performed.  The closer the procedure is to the spine, the more serious the risks are.  Great care is taken when placing the radio frequency needles, block needles or lesioning probes, but sometimes complications can occur. 1. Infection: Any time there is an injection through the skin, there is a risk of infection.  This is why sterile conditions are used for these blocks.  There are four possible types of infection. 1. Localized skin infection. 2. Central Nervous System Infection-This can be in the form of Meningitis, which can be deadly. 3. Epidural Infections-This can be in the form of an epidural abscess, which can cause pressure inside of the spine, causing compression of the spinal cord with subsequent paralysis. This would require an emergency surgery to decompress, and there are no guarantees that the patient would recover from the paralysis. 4. Discitis-This is an infection of the intervertebral discs.  It occurs in about 1% of discography procedures.  It is difficult to treat and it may lead to surgery.        2. Pain: the  needles have to go through skin and soft tissues, will cause soreness.       3. Damage to internal structures:  The nerves to be lesioned may be near blood vessels or    other nerves which can be potentially damaged.       4. Bleeding: Bleeding is more common if the patient is taking blood thinners such as  aspirin, Coumadin, Ticiid, Plavix, etc., or if he/she have some genetic predisposition  such as hemophilia. Bleeding into the spinal canal can cause compression of the spinal  cord with subsequent paralysis.  This would require an emergency surgery to  decompress and there are no guarantees that the patient would recover from the  paralysis.       5. Pneumothorax:  Puncturing of a lung is a possibility, every time a needle is introduced in  the area of the chest or upper back.  Pneumothorax refers to free air around the  collapsed lung(s), inside of the thoracic cavity (chest cavity).  Another two possible  complications related to a similar event would include: Hemothorax and Chylothorax.   These are variations of the Pneumothorax, where instead of air around the collapsed  lung(s), you may have blood or chyle, respectively.       6. Spinal headaches: They may occur with any procedures in the area of the spine.       7. Persistent CSF (Cerebro-Spinal Fluid) leakage: This is a rare problem, but may occur  with prolonged intrathecal or epidural catheters either due to the formation of a fistulous  track or a dural tear.  8. Nerve damage: By working so close to the spinal cord, there is always a possibility of  nerve damage, which could be as serious as a permanent spinal cord injury with  paralysis.       9. Death:  Although rare, severe deadly allergic reactions known as "Anaphylactic  reaction" can occur to any of the medications used.      10. Worsening of the symptoms:  We can always make thing worse.  What are the chances of something like this happening? Chances of any of this occuring are  extremely low.  By statistics, you have more of a chance of getting killed in a motor vehicle accident: while driving to the hospital than any of the above occurring .  Nevertheless, you should be aware that they are possibilities.  In general, it is similar to taking a shower.  Everybody knows that you can slip, hit your head and get killed.  Does that mean that you should not shower again?  Nevertheless always keep in mind that statistics do not mean anything if you happen to be on the wrong side of them.  Even if a procedure has a 1 (one) in a 1,000,000 (million) chance of going wrong, it you happen to be that one..Also, keep in mind that by statistics, you have more of a chance of having something go wrong when taking medications.  Who should not have this procedure? If you are on a blood thinning medication (e.g. Coumadin, Plavix, see list of "Blood Thinners"), or if you have an active infection going on, you should not have the procedure.  If you are taking any blood thinners, please inform your physician.  How should I prepare for this procedure?  Do not eat or drink anything at least six hours prior to the procedure.  Bring a driver with you .  It cannot be a taxi.  Come accompanied by an adult that can drive you back, and that is strong enough to help you if your legs get weak or numb from the local anesthetic.  Take all of your medicines the morning of the procedure with just enough water to swallow them.  If you have diabetes, make sure that you are scheduled to have your procedure done first thing in the morning, whenever possible.  If you have diabetes, take only half of your insulin dose and notify our nurse that you have done so as soon as you arrive at the clinic.  If you are diabetic, but only take blood sugar pills (oral hypoglycemic), then do not take them on the morning of your procedure.  You may take them after you have had the procedure.  Do not take aspirin or any  aspirin-containing medications, at least eleven (11) days prior to the procedure.  They may prolong bleeding.  Wear loose fitting clothing that may be easy to take off and that you would not mind if it got stained with Betadine or blood.  Do not wear any jewelry or perfume  Remove any nail coloring.  It will interfere with some of our monitoring equipment.  NOTE: Remember that this is not meant to be interpreted as a complete list of all possible complications.  Unforeseen problems may occur.  BLOOD THINNERS The following drugs contain aspirin or other products, which can cause increased bleeding during surgery and should not be taken for 2 weeks prior to and 1 week after surgery.  If you should need take something for relief of minor  pain, you may take acetaminophen which is found in Tylenol,m Datril, Anacin-3 and Panadol. It is not blood thinner. The products listed below are.  Do not take any of the products listed below in addition to any listed on your instruction sheet.  A.P.C or A.P.C with Codeine Codeine Phosphate Capsules #3 Ibuprofen Ridaura  ABC compound Congesprin Imuran rimadil  Advil Cope Indocin Robaxisal  Alka-Seltzer Effervescent Pain Reliever and Antacid Coricidin or Coricidin-D  Indomethacin Rufen  Alka-Seltzer plus Cold Medicine Cosprin Ketoprofen S-A-C Tablets  Anacin Analgesic Tablets or Capsules Coumadin Korlgesic Salflex  Anacin Extra Strength Analgesic tablets or capsules CP-2 Tablets Lanoril Salicylate  Anaprox Cuprimine Capsules Levenox Salocol  Anexsia-D Dalteparin Magan Salsalate  Anodynos Darvon compound Magnesium Salicylate Sine-off  Ansaid Dasin Capsules Magsal Sodium Salicylate  Anturane Depen Capsules Marnal Soma  APF Arthritis pain formula Dewitt's Pills Measurin Stanback  Argesic Dia-Gesic Meclofenamic Sulfinpyrazone  Arthritis Bayer Timed Release Aspirin Diclofenac Meclomen Sulindac  Arthritis pain formula Anacin Dicumarol Medipren Supac  Analgesic  (Safety coated) Arthralgen Diffunasal Mefanamic Suprofen  Arthritis Strength Bufferin Dihydrocodeine Mepro Compound Suprol  Arthropan liquid Dopirydamole Methcarbomol with Aspirin Synalgos  ASA tablets/Enseals Disalcid Micrainin Tagament  Ascriptin Doan's Midol Talwin  Ascriptin A/D Dolene Mobidin Tanderil  Ascriptin Extra Strength Dolobid Moblgesic Ticlid  Ascriptin with Codeine Doloprin or Doloprin with Codeine Momentum Tolectin  Asperbuf Duoprin Mono-gesic Trendar  Aspergum Duradyne Motrin or Motrin IB Triminicin  Aspirin plain, buffered or enteric coated Durasal Myochrisine Trigesic  Aspirin Suppositories Easprin Nalfon Trillsate  Aspirin with Codeine Ecotrin Regular or Extra Strength Naprosyn Uracel  Atromid-S Efficin Naproxen Ursinus  Auranofin Capsules Elmiron Neocylate Vanquish  Axotal Emagrin Norgesic Verin  Azathioprine Empirin or Empirin with Codeine Normiflo Vitamin E  Azolid Emprazil Nuprin Voltaren  Bayer Aspirin plain, buffered or children's or timed BC Tablets or powders Encaprin Orgaran Warfarin Sodium  Buff-a-Comp Enoxaparin Orudis Zorpin  Buff-a-Comp with Codeine Equegesic Os-Cal-Gesic   Buffaprin Excedrin plain, buffered or Extra Strength Oxalid   Bufferin Arthritis Strength Feldene Oxphenbutazone   Bufferin plain or Extra Strength Feldene Capsules Oxycodone with Aspirin   Bufferin with Codeine Fenoprofen Fenoprofen Pabalate or Pabalate-SF   Buffets II Flogesic Panagesic   Buffinol plain or Extra Strength Florinal or Florinal with Codeine Panwarfarin   Buf-Tabs Flurbiprofen Penicillamine   Butalbital Compound Four-way cold tablets Penicillin   Butazolidin Fragmin Pepto-Bismol   Carbenicillin Geminisyn Percodan   Carna Arthritis Reliever Geopen Persantine   Carprofen Gold's salt Persistin   Chloramphenicol Goody's Phenylbutazone   Chloromycetin Haltrain Piroxlcam   Clmetidine heparin Plaquenil   Cllnoril Hyco-pap Ponstel   Clofibrate Hydroxy chloroquine  Propoxyphen         Before stopping any of these medications, be sure to consult the physician who ordered them.  Some, such as Coumadin (Warfarin) are ordered to prevent or treat serious conditions such as "deep thrombosis", "pumonary embolisms", and other heart problems.  The amount of time that you may need off of the medication may also vary with the medication and the reason for which you were taking it.  If you are taking any of these medications, please make sure you notify your pain physician before you undergo any procedures.         Pain Management Discharge Instructions  General Discharge Instructions :  If you need to reach your doctor call: Monday-Friday 8:00 am - 4:00 pm at [phone] or toll free [phone].  After clinic hours [phone] to have operator reach doctor.  Bring all of  your medication bottles to all your appointments in the pain clinic.  To cancel or reschedule your appointment with Pain Management please remember to call 24 hours in advance to avoid a fee.  Refer to the educational materials which you have been given on: General Risks, I had my Procedure. Discharge Instructions, Post Sedation.  Post Procedure Instructions:  The drugs you were given will stay in your system until tomorrow, so for the next 24 hours you should not drive, make any legal decisions or drink any alcoholic beverages.  You may eat anything you prefer, but it is better to start with liquids then soups and crackers, and gradually work up to solid foods.  Please notify your doctor immediately if you have any unusual bleeding, trouble breathing or pain that is not related to your normal pain.  Depending on the type of procedure that was done, some parts of your body may feel week and/or numb.  This usually clears up by tonight or the next day.  Walk with the use of an assistive device or accompanied by an adult for the 24 hours.  You may use ice on the affected area for  the first 24 hours.  Put ice in a Ziploc bag and cover with a towel and place against area 15 minutes on 15 minutes off.  You may switch to heat after 24 hours.

## 2015-12-30 NOTE — Progress Notes (Signed)
   Subjective:    Patient ID: [name], female    DOB: [birth year], 60 y.o.   MRN: [id number]  HPI  PROCEDURE PERFORMED: Cluneal sciatic nerve block   NOTE: The patient is a 60 y.o. female who returns to [place] for further evaluation and treatment of pain involving the lumbar and lower extremity region with pain occurring in the region of the buttocks and lower extremity of significant degree. Prior studies revealed the patient to be with MRI findings of Degenerative disc disease lumbar spine Most predominant at L4-5.Multilevel facet arthropathy at L2-3, L3-4, L4-5, with disc space narrowing as well Disc desiccation at T12-L1 to L4-L5. There is concern regarding significant component of patient's pain began due to gluteal and piriformis neuralgia as well The risks, benefits, and expectations of the procedure have been discussed and explained to the patient who was understanding and in agreement with suggested treatment plan. We will proceed with interventional treatment as discussed and as explained to the patient who wishes to proceed with proposed treatment.   PROCEDURE #1: Right cluneal nerve block with EKG, blood pressure, pulse, and pulse oximetry monitoring. The procedure was performed with the patient in the lateral decubitus position. Following alcohol prep of proposed entry site and identification of landmarks, a 22 -gauge needle was inserted into the gluteal musculature region and following elicitation of paresthesias radiating to the buttocks, needle was slightly withdrawn and following negative aspiration, a total of 4 mL of 0.25% bupivacaine with Toradol was injected for right  cluneal nerve block. Needle removed. The patient tolerated the injection well.   PROCEDURE #2: Right sciatic nerve block with EKG, blood pressure, pulse, and pulse oximetry monitoring. The procedure was performed with the patient in the lateral decubitus position. Following identification of  greater trochanter for establishing point of needle entry and alcohol prep of proposed entry site, a 22 -gauge needle was inserted and following elicitation of paresthesias radiating from buttocks to the foot, needle was slightly withdrawn. Following negative aspiration, a total of 4 mL of 0.25% bupivacaine with Toradol was injected for right sciatic nerve block. Needle removed. The patient tolerated injection well. A total of 60 mg of Toradol was utilized for the procedure.   PLAN:   1. Medications: Will continue presently prescribed medications. Soma and hydrocodone acetaminophen 2. Will consider modification of treatment regimen pending response to treatment rendered on today's visit and follow-up evaluation. 3. The patient is to follow-up with primary care physician [name] regarding blood pressure and general medical condition status post procedure performed on today's visit. 4. Surgical evaluation as discussed. Has been addressed 5. Neurological evaluation as discussed. Has been addressed 6. The patient may be a candidate for radiofrequency procedures, Botox injections, implantation type procedures, and other treatment pending response to treatment rendered on today's visit and follow-up evaluation. 7. The patient has been advised to adhere to proper body mechanics and avoid activities which appear to aggravate condition. 8. The patient has been advised to call the Pain Management Center prior to scheduled return appointment should there be significant change in condition or should patient have other concerns regarding condition prior to scheduled return appointment.  The patient is understanding and in agreement with suggested treatment plan.    Review of Systems     Objective:   Physical Exam        Assessment & Plan:

## 2015-12-30 NOTE — Progress Notes (Signed)
Safety precautions to be maintained throughout the outpatient stay will include: orient to surroundings, keep bed in low position, maintain call bell within reach at all times, provide assistance with transfer out of bed and ambulation.  

## 2015-12-31 ENCOUNTER — Telehealth: Payer: Self-pay | Admitting: *Deleted

## 2015-12-31 ENCOUNTER — Other Ambulatory Visit: Payer: Self-pay | Admitting: Unknown Physician Specialty

## 2015-12-31 NOTE — Telephone Encounter (Signed)
No problems post procedure. 

## 2016-01-21 ENCOUNTER — Ambulatory Visit: Payer: BLUE CROSS/BLUE SHIELD | Attending: Pain Medicine | Admitting: Pain Medicine

## 2016-01-21 ENCOUNTER — Encounter: Payer: Self-pay | Admitting: Pain Medicine

## 2016-01-21 VITALS — BP 136/55 | HR 84 | Temp 98.2°F | Resp 16 | Ht 63.0 in | Wt 265.0 lb

## 2016-01-21 DIAGNOSIS — M4806 Spinal stenosis, lumbar region: Secondary | ICD-10-CM | POA: Insufficient documentation

## 2016-01-21 DIAGNOSIS — M1288 Other specific arthropathies, not elsewhere classified, other specified site: Secondary | ICD-10-CM | POA: Insufficient documentation

## 2016-01-21 DIAGNOSIS — M48062 Spinal stenosis, lumbar region with neurogenic claudication: Secondary | ICD-10-CM

## 2016-01-21 DIAGNOSIS — G588 Other specified mononeuropathies: Secondary | ICD-10-CM | POA: Insufficient documentation

## 2016-01-21 DIAGNOSIS — M47816 Spondylosis without myelopathy or radiculopathy, lumbar region: Secondary | ICD-10-CM

## 2016-01-21 DIAGNOSIS — M5416 Radiculopathy, lumbar region: Secondary | ICD-10-CM

## 2016-01-21 DIAGNOSIS — M5116 Intervertebral disc disorders with radiculopathy, lumbar region: Secondary | ICD-10-CM | POA: Insufficient documentation

## 2016-01-21 DIAGNOSIS — M170 Bilateral primary osteoarthritis of knee: Secondary | ICD-10-CM | POA: Insufficient documentation

## 2016-01-21 DIAGNOSIS — Z981 Arthrodesis status: Secondary | ICD-10-CM

## 2016-01-21 DIAGNOSIS — M533 Sacrococcygeal disorders, not elsewhere classified: Secondary | ICD-10-CM | POA: Diagnosis not present

## 2016-01-21 DIAGNOSIS — M545 Low back pain: Secondary | ICD-10-CM | POA: Diagnosis present

## 2016-01-21 DIAGNOSIS — M79606 Pain in leg, unspecified: Secondary | ICD-10-CM | POA: Diagnosis present

## 2016-01-21 MED ORDER — HYDROCODONE-ACETAMINOPHEN 10-325 MG PO TABS: ORAL_TABLET | ORAL | Status: DC

## 2016-01-21 MED ORDER — CARISOPRODOL 350 MG PO TABS: ORAL_TABLET | ORAL | Status: DC

## 2016-01-21 NOTE — Progress Notes (Signed)
Subjective:    Patient ID: [name], female    DOB: Jun 28, 1956, 60 y.o.   MRN: [id number]  HPI  The patient is a 60 year old female who returns to pain management for further evaluation and treatment of pain involving the lower back and lower extremity region. The patient states that she has significant pain involving the region of the right knee at this time. The patient is without known trauma change in events of daily living to cause exacerbation of severe pain of the right knee. We discussed patient's condition and will proceed with injection of right knee as discussed at time of return appointment. The patient will be considered for further evaluation including orthopedic evaluation as discussed and as well. We will continue present medication at this time consisting of Soma and hydrocodone acetaminophen. The patient states that her insurance company will no longer cover with Soma. We discussed patient titrate and Soma and will consider replacing Soma with pain of the medication. The patient was with understanding and agreement suggested treatment plan. We will proceed with the injection at time return appointment in attempt to decrease severity of symptoms, minimize progression of symptoms, and avoid the need for more involved treatment. The patient agreed to suggested treatment plan  Review of Systems     Objective:   Physical Exam  There was tenderness of the paraspinal muscular treat in the cervical region cervical facet region a mild degree with mild tenderness of the splenius capitis and occipitalis musculature regions. Palpation of the acromioclavicular and glenohumeral joint regions reproduce mild discomfort as well. There was tenderness to palpation over the cervical facet cervical paraspinal musculature region on the left as well as on the right. There was tenderness to palpation of the splenius capitis and occipitalis musculature region of mild degree. Patient appeared to be  with bilaterally equal grip strength and Tinel and Phalen's maneuver were without increase of pain of significant degree. Palpation over the thoracic facet thoracic paraspinal musculature region was with no crepitus of the thoracic region noted. There was evidence of muscle spasms and involving the lower lumbar paraspinal musculature region on the left as well as on the right. Palpation over the PSIS and PII S region was with moderate discomfort. There was mild tenderness of the greater trochanteric region and iliotibial band region. Straight leg raising was tolerates approximately 20 without increased pain with dorsiflexion noted. The knee was attends to palpation with significant crepitus of the knee with no increased warmth and erythema in the region of the knee. There was negative anterior and posterior drawer signs. No ballottement of the patella was noted. Until strength appeared to be decreased. Vision of the lower extremity noted. There was negative clonus negative Homans. Abdomen was nontender with no costovertebral tenderness noted      Assessment & Plan:    Degenerative joint disease of knees  Gluteal and piriformis neuralgia  Degenerative disc disease lumbar spine Most predominant at L4-5. Multilevel facet arthropathy at L2-3, L3-4, L4-5, with disc space narrowing as well Disc desiccation at T12-L1 to L4-L5  Lumbar facet syndrome  Lumbar radiculopathy  Lumbar stenosis  Sacroiliac joint dysfunction     PLAN   Continue present medications Soma and hydrocodone acetaminophen. Please take her [name] as discussed and call if you experience any undesirable side effects  Knee injection to be performed at time of return appointment  F/U PCP [name] for evaliation of  BP and general medical  condition.   F/U surgical evaluation as  discussed  TENS unit as discussed  F/U neurological evaluation. May consider pending follow-up evaluations  May consider radiofrequency rhizolysis  or intraspinal procedures pending response to present treatment and F/U evaluation   Patient to call Pain Management Center should patient have concerns prior to scheduled return appointment.

## 2016-01-21 NOTE — Patient Instructions (Addendum)
PLAN   Continue present medications Soma and hydrocodone acetaminophen. Please take her [name] as discussed and call if you experience any undesirable side effects  Knee injection to be performed at time of return appointment  Cluneal and sciatic nerve block to be performed at time of return appointment  F/U PCP [name] for evaliation of  BP and general medical  condition.   F/U surgical evaluation as discussed  TENS unit. Please ask receptionist how you will get your TENS unit  F/U neurological evaluation. May consider pending follow-up evaluations  May consider radiofrequency rhizolysis or intraspinal procedures pending response to present treatment and F/U evaluation   Patient to call Pain Management Center should patient have concerns prior to scheduled return appointment.  Knee Injection A knee injection is a procedure to get medicine into your knee joint. Your health care provider puts a needle into the joint and injects medicine with an attached syringe. The injected medicine may relieve the pain, swelling, and stiffness of arthritis. The injected medicine may also help to lubricate and cushion your knee joint. You may need more than one injection. LET [facility] CARE PROVIDER KNOW ABOUT:  Any allergies you have.  All medicines you are taking, including vitamins, herbs, eye drops, creams, and over-the-counter medicines.  Previous problems you or members of your family have had with the use of anesthetics.  Any blood disorders you have.  Previous surgeries you have had.  Any medical conditions you may have. RISKS AND COMPLICATIONS Generally, this is a safe procedure. However, problems may occur, including:  Infection.  Bleeding.  Worsening symptoms.  Damage to the area around your knee.  Allergic reaction to any of the medicines.  Skin reactions from repeated injections. BEFORE THE PROCEDURE  Ask your health care provider about changing or stopping your regular  medicines. This is especially important if you are taking diabetes medicines or blood thinners.  Plan to have someone take you home after the procedure. PROCEDURE  You will sit or lie down in a position for your knee to be treated.  The skin over your kneecap will be cleaned with a germ-killing solution (antiseptic).  You will be given a medicine that numbs the area (local anesthetic). You may feel some stinging.  After your knee becomes numb, you will have a second injection. This is the medicine. This needle is carefully placed between your kneecap and your knee. The medicine is injected into the joint space.  At the end of the procedure, the needle will be removed.  A bandage (dressing) may be placed over the injection site. The procedure may vary among health care providers and hospitals. AFTER THE PROCEDURE  You may have to move your knee through its full range of motion. This helps to get all of the medicine into your joint space.  Your blood pressure, heart rate, breathing rate, and blood oxygen level will be monitored often until the medicines you were given have worn off.  You will be watched to make sure that you do not have a reaction to the injected medicine.   This information is not intended to replace advice given to you by your health care provider. Make sure you discuss any questions you have with your health care provider.   Document Released: 02/07/2007 Document Revised: 12/07/2014 Document Reviewed: 09/26/2014 Elsevier Interactive Patient Education 2016 [employer].  A prescription for SOMA, HYDROCODONE was given to you today.

## 2016-01-21 NOTE — Progress Notes (Signed)
Safety precautions to be maintained throughout the outpatient stay will include: orient to surroundings, keep bed in low position, maintain call bell within reach at all times, provide assistance with transfer out of bed and ambulation.  

## 2016-01-22 ENCOUNTER — Ambulatory Visit: Payer: BLUE CROSS/BLUE SHIELD | Attending: Pain Medicine | Admitting: Pain Medicine

## 2016-01-22 ENCOUNTER — Encounter: Payer: Self-pay | Admitting: Pain Medicine

## 2016-01-22 VITALS — BP 115/81 | HR 86 | Temp 98.6°F | Resp 15 | Ht 63.0 in | Wt 265.0 lb

## 2016-01-22 DIAGNOSIS — M533 Sacrococcygeal disorders, not elsewhere classified: Secondary | ICD-10-CM

## 2016-01-22 DIAGNOSIS — Z981 Arthrodesis status: Secondary | ICD-10-CM

## 2016-01-22 DIAGNOSIS — M25562 Pain in left knee: Secondary | ICD-10-CM | POA: Diagnosis present

## 2016-01-22 DIAGNOSIS — M170 Bilateral primary osteoarthritis of knee: Secondary | ICD-10-CM | POA: Diagnosis not present

## 2016-01-22 DIAGNOSIS — M48062 Spinal stenosis, lumbar region with neurogenic claudication: Secondary | ICD-10-CM

## 2016-01-22 DIAGNOSIS — M47816 Spondylosis without myelopathy or radiculopathy, lumbar region: Secondary | ICD-10-CM

## 2016-01-22 DIAGNOSIS — M25561 Pain in right knee: Secondary | ICD-10-CM | POA: Diagnosis present

## 2016-01-22 DIAGNOSIS — M5416 Radiculopathy, lumbar region: Secondary | ICD-10-CM

## 2016-01-22 IMAGING — MG MM DIGITAL SCREENING BILAT W/ CAD
5 series · 5 of 5 positions shown · non-contrast
Comparison: Previous exam(s).

ACR Breast Density Category a: The breast tissue is almost entirely
fatty.

CLINICAL DATA: Screening.

EXAM:
DIGITAL SCREENING BILATERAL MAMMOGRAM WITH CAD

[L MLO (1 of 2)]
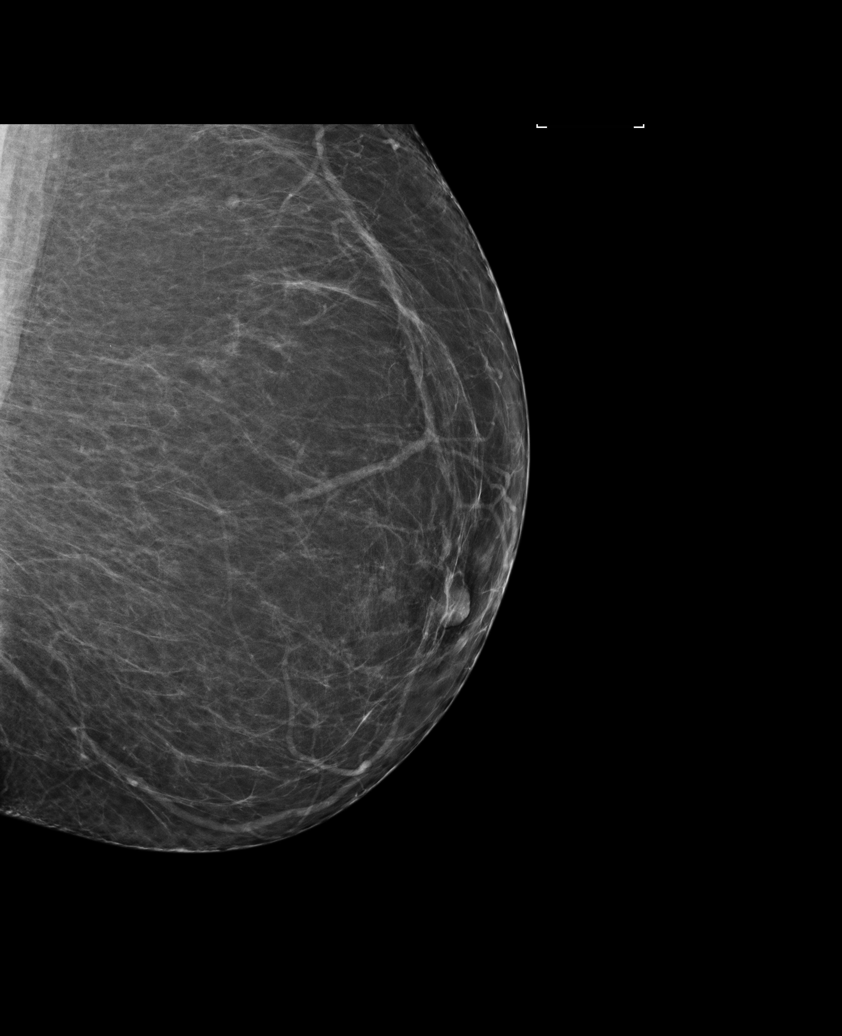

[R MLO]
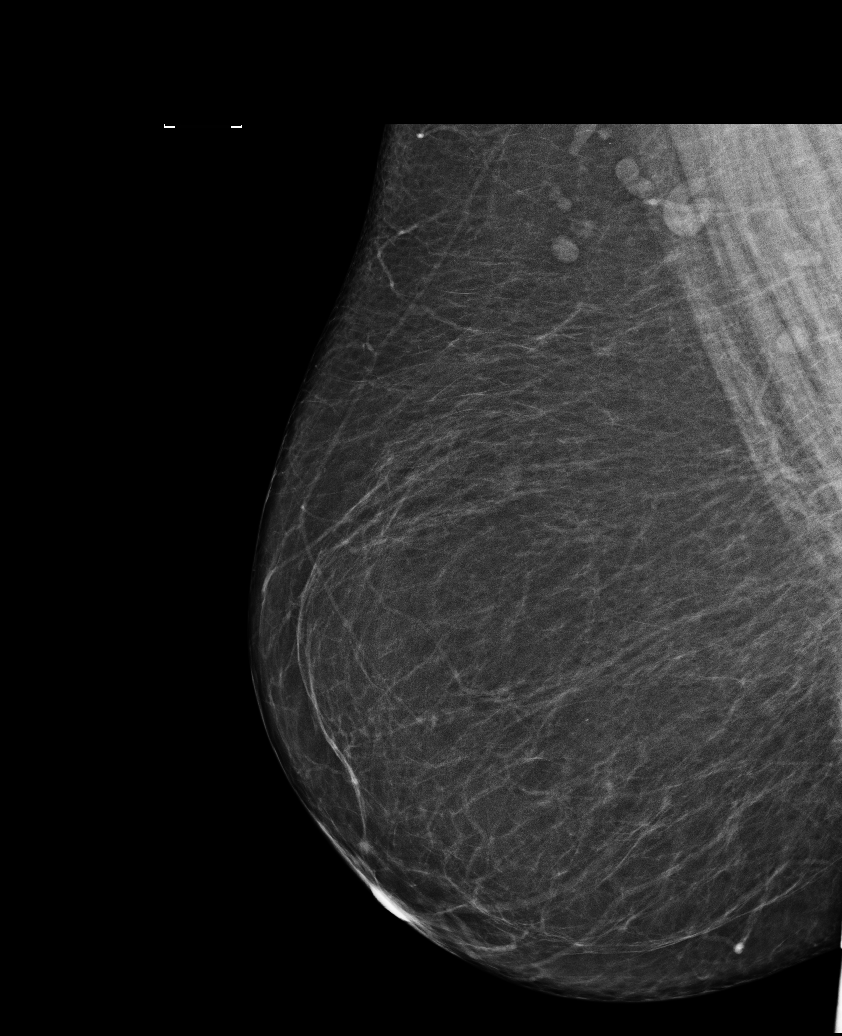

[R CC]
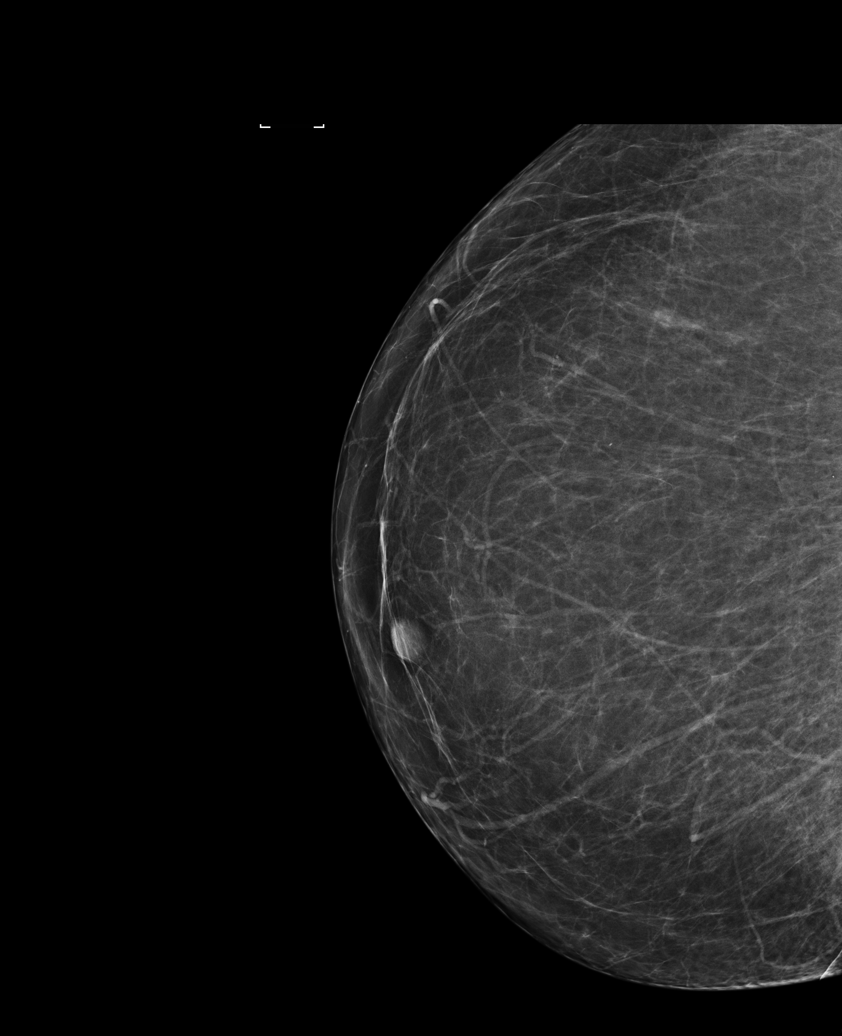

[L MLO (2 of 2)]
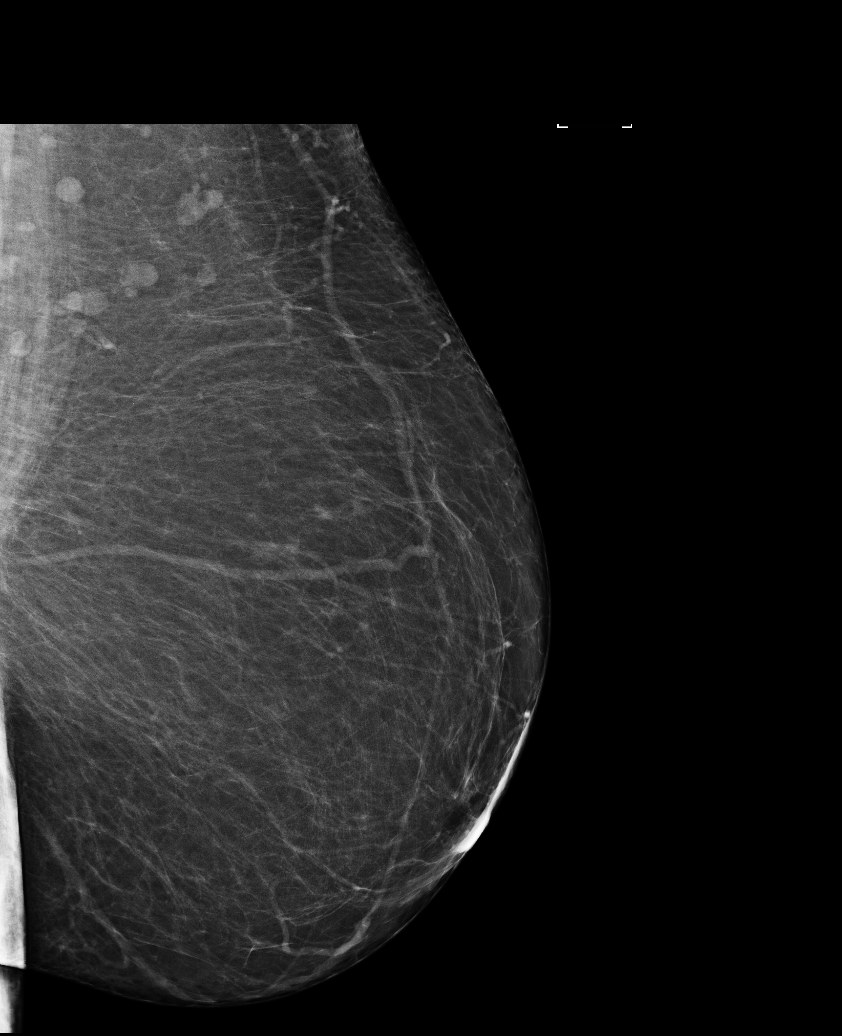

[L CC]
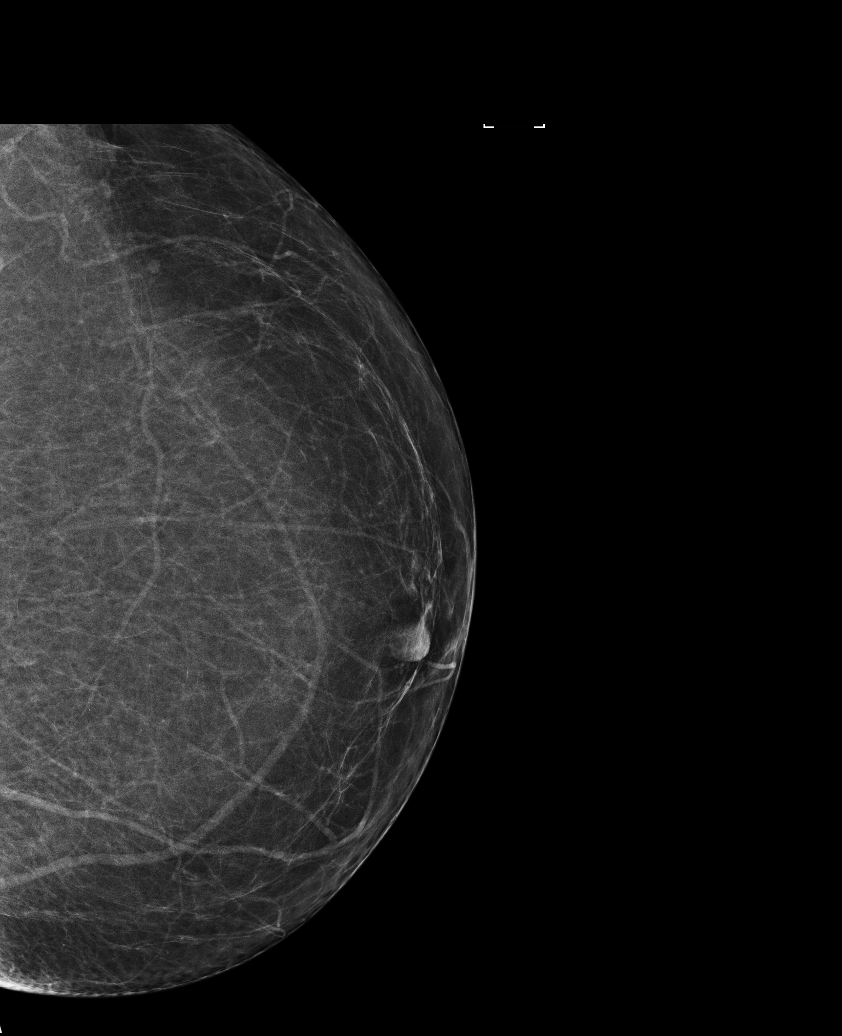

[5 of 5 positions shown; findings below may reference images not displayed]

FINDINGS: There are no findings suspicious for malignancy. Images were
processed with CAD.
IMPRESSION: No mammographic evidence of malignancy. A result letter of this
screening mammogram will be mailed directly to the patient.

RECOMMENDATION:
Screening mammogram in one year. (Code:[id number])

BI-RADS CATEGORY  1: Negative.

## 2016-01-22 MED ORDER — CEFUROXIME AXETIL 500 MG PO TABS: ORAL_TABLET | ORAL | Status: DC

## 2016-01-22 MED ORDER — TRIAMCINOLONE ACETONIDE 40 MG/ML IJ SUSP: 40.0000 mg | Freq: Once | INTRAMUSCULAR | Status: DC

## 2016-01-22 MED ORDER — BUPIVACAINE HCL (PF) 0.25 % IJ SOLN: 30.0000 mL | Freq: Once | INTRAMUSCULAR | Status: DC

## 2016-01-22 MED ORDER — BUPIVACAINE HCL (PF) 0.25 % IJ SOLN
INTRAMUSCULAR | Status: AC
  Administered 2016-01-22: 15:00:00
  Filled 2016-01-22: qty 30

## 2016-01-22 MED ORDER — TRIAMCINOLONE ACETONIDE 40 MG/ML IJ SUSP
INTRAMUSCULAR | Status: AC
  Administered 2016-01-22: 15:00:00
  Filled 2016-01-22: qty 1

## 2016-01-22 NOTE — Patient Instructions (Addendum)
PLAN   Continue present medications Soma and hydrocodone acetaminophen. Please get Ceftin antibiotic today and begin taking Ceftin antibiotic as prescribed  F/U PCP [name] for evaliation of  BP and general medical  condition. Please follow-up with [name] as discussed  F/U surgical evaluation. Please ask receptionist date of neurosurgical evaluation  TENS unit as discussed  F/U neurological evaluation. May consider pending follow-up evaluations  May consider radiofrequency rhizolysis or intraspinal procedures pending response to present treatment and F/U evaluation   Patient to call Pain Management Center should patient have concerns prior to scheduled return appointment.Knee Injection A knee injection is a procedure to get medicine into your knee joint. Your health care provider puts a needle into the joint and injects medicine with an attached syringe. The injected medicine may relieve the pain, swelling, and stiffness of arthritis. The injected medicine may also help to lubricate and cushion your knee joint. You may need more than one injection. LET [facility] CARE PROVIDER KNOW ABOUT:  Any allergies you have.  All medicines you are taking, including vitamins, herbs, eye drops, creams, and over-the-counter medicines.  Previous problems you or members of your family have had with the use of anesthetics.  Any blood disorders you have.  Previous surgeries you have had.  Any medical conditions you may have. RISKS AND COMPLICATIONS Generally, this is a safe procedure. However, problems may occur, including:  Infection.  Bleeding.  Worsening symptoms.  Damage to the area around your knee.  Allergic reaction to any of the medicines.  Skin reactions from repeated injections. BEFORE THE PROCEDURE  Ask your health care provider about changing or stopping your regular medicines. This is especially important if you are taking diabetes medicines or blood thinners.  Plan to  have someone take you home after the procedure. PROCEDURE  You will sit or lie down in a position for your knee to be treated.  The skin over your kneecap will be cleaned with a germ-killing solution (antiseptic).  You will be given a medicine that numbs the area (local anesthetic). You may feel some stinging.  After your knee becomes numb, you will have a second injection. This is the medicine. This needle is carefully placed between your kneecap and your knee. The medicine is injected into the joint space.  At the end of the procedure, the needle will be removed.  A bandage (dressing) may be placed over the injection site. The procedure may vary among health care providers and hospitals. AFTER THE PROCEDURE  You may have to move your knee through its full range of motion. This helps to get all of the medicine into your joint space.  Your blood pressure, heart rate, breathing rate, and blood oxygen level will be monitored often until the medicines you were given have worn off.  You will be watched to make sure that you do not have a reaction to the injected medicine.   This information is not intended to replace advice given to you by your health care provider. Make sure you discuss any questions you have with your health care provider.   Document Released: 02/07/2007 Document Revised: 12/07/2014 Document Reviewed: 09/26/2014 Elsevier Interactive Patient Education 2016 [employer] Pain Management Discharge Instructions  General Discharge Instructions :  If you need to reach your doctor call: Monday-Friday 8:00 am - 4:00 pm at [phone] or toll free [phone].  After clinic hours [phone] to have operator reach doctor.  Bring all of your medication bottles to all your appointments  in the pain clinic.  To cancel or reschedule your appointment with Pain Management please remember to call 24 hours in advance to avoid a fee.  Refer to the educational materials which  you have been given on: General Risks, I had my Procedure. Discharge Instructions, Post Sedation.  Post Procedure Instructions:  The drugs you were given will stay in your system until tomorrow, so for the next 24 hours you should not drive, make any legal decisions or drink any alcoholic beverages.  You may eat anything you prefer, but it is better to start with liquids then soups and crackers, and gradually work up to solid foods.  Please notify your doctor immediately if you have any unusual bleeding, trouble breathing or pain that is not related to your normal pain.  Depending on the type of procedure that was done, some parts of your body may feel week and/or numb.  This usually clears up by tonight or the next day.  Walk with the use of an assistive device or accompanied by an adult for the 24 hours.  You may use ice on the affected area for the first 24 hours.  Put ice in a Ziploc bag and cover with a towel and place against area 15 minutes on 15 minutes off.  You may switch to heat after 24 hours.

## 2016-01-22 NOTE — Progress Notes (Signed)
   Subjective:    Patient ID: [name], female    DOB: Dec 03, 1955, 60 y.o.   MRN: [id number]  HPI                             PROCEDURE: RIGHT   KNEE INJECTION    The patient is a 60  -year-old female who returns to [place] for further evaluation and treatment of pain involving the region of the knees. The patient is with degenerative joint disease of the knees. There is concern regarding intra-articular abnormalities of the knee contributing to patient's symptomatology. We will proceed with interventional treatment consisting of knee injection in attempt to decrease severity of patient's symptoms, minimize progression of patient's symptoms, and avoid the need for more involved treatment. The risks, benefits, and expectations of the procedure have been explained to the patient who is with understanding and with agreement to proceed with interventional treatment as planned.      DESCRIPTION OF PROCEDURE:  RIGHT KNEE INJECTION   The patient is in the  sitting positio with head of bed at 45 angle and the knee in the flexed  Position.  EKG, blood pressure, pulse, and pulse oximetry monitoring all in place. Betadine prep of proposed entry site is accomplished.  RIGHT KNEE  INJECTION (LATERAL APPROACH)  Following identification of landmarks for knee injection, a 22-gauge needle was inserted inferior and lateral to the patella.  A total of 2 cc of 0.25% bupivacaine with Kenalog was injected for right knee injection lateral approach.  RIGHT KNEE INJECTION  (MEDIAL APPROACH)  Following identification of landmarks for knee injection, a 22-gauge needle was inserted inferior and medial to the patella. A total of 2 cc of 0.25% bupivacaine with Kenalog was injected for right knee injection medial approach.   The patient tolerated the procedure well   A total of 40 mg of Kenalog was utilized for the procedure.    Plan  Continue present medication Soma and hydrocodone  acetaminophen and begin taking your  Ceftin antibiotic Please obtain your Ceftin antibiotic today and begin taking antibiotic today as prescribed  F/U PCP Dr. Jeananne Rama for evaliation of  BP and general medical  condition.  F/U surgical evaluation as discussed We will consider surgical evaluation as discussed  F/U neurological evaluation. May be considered  May consider radiofrequency rhizolysis or intraspinal procedures pending response to present treatment and F/U evaluation.  Patient to call Pain Management Center should patient have concerns prior to scheduled return appointment.        Review of Systems     Objective:   Physical Exam        Assessment & Plan:

## 2016-01-23 ENCOUNTER — Telehealth: Payer: Self-pay | Admitting: *Deleted

## 2016-01-23 NOTE — Telephone Encounter (Signed)
Left voicemail for patient to call our office if there are any questions or concerns re; procedure on yesterday.  

## 2016-01-28 ENCOUNTER — Encounter: Payer: Self-pay | Admitting: Unknown Physician Specialty

## 2016-01-28 ENCOUNTER — Ambulatory Visit (INDEPENDENT_AMBULATORY_CARE_PROVIDER_SITE_OTHER): Payer: BLUE CROSS/BLUE SHIELD | Admitting: Unknown Physician Specialty

## 2016-01-28 VITALS — BP 121/71 | HR 88 | Temp 98.5°F | Ht 61.7 in | Wt 269.2 lb

## 2016-01-28 DIAGNOSIS — J449 Chronic obstructive pulmonary disease, unspecified: Secondary | ICD-10-CM | POA: Diagnosis not present

## 2016-01-28 DIAGNOSIS — I10 Essential (primary) hypertension: Secondary | ICD-10-CM | POA: Diagnosis not present

## 2016-01-28 DIAGNOSIS — Z720 Tobacco use: Secondary | ICD-10-CM

## 2016-01-28 DIAGNOSIS — M5416 Radiculopathy, lumbar region: Secondary | ICD-10-CM

## 2016-01-28 DIAGNOSIS — E785 Hyperlipidemia, unspecified: Secondary | ICD-10-CM | POA: Diagnosis not present

## 2016-01-28 DIAGNOSIS — F324 Major depressive disorder, single episode, in partial remission: Secondary | ICD-10-CM

## 2016-01-28 MED ORDER — CLONAZEPAM 1 MG PO TABS: 1.0000 mg | ORAL_TABLET | Freq: Two times a day (BID) | ORAL | Status: DC

## 2016-01-28 NOTE — Assessment & Plan Note (Signed)
Stable, continue present medications.   

## 2016-01-28 NOTE — Assessment & Plan Note (Signed)
Check lipid panel  

## 2016-01-28 NOTE — Assessment & Plan Note (Signed)
Encouraged to quit smoking.  

## 2016-01-28 NOTE — Assessment & Plan Note (Signed)
Under care of Dr. Josefa Half

## 2016-01-28 NOTE — Progress Notes (Signed)
BP 121/71 mmHg  Pulse 88  Temp(Src) 98.5 F (36.9 C)  Ht 5' 1.7" (1.567 m)  Wt 269 lb 3.2 oz (122.108 kg)  BMI 49.73 kg/m2  SpO2 94%  LMP  (LMP Unknown)   Subjective:    Patient ID: [name], female    DOB: [birth year], 60 y.o.   MRN: [id number]  HPI: [name] is a 60 y.o. female  Chief Complaint  Patient presents with  . Hyperlipidemia  . Hypertension  . Depression  . Medication Refill    pt states she needs a refill on clonopin   Hypertension Using medications without difficulty  No problems or lightheadedness No chest pain with exertion or shortness of breath No Edema   Hyperlipidemia Using medications without problems: No Muscle aches  Diet compliance: Only eats supper Exercise: not exercising due to mobility  Depression Stable on current treatment.  Takes Clonazepam BID.   Depression screen [facility] 2/9 01/28/2016 12/30/2015 12/23/2015 10/30/2015 10/28/2015  Decreased Interest 1 0 0 0 1  Down, Depressed, Hopeless 0 0 0 0 1  PHQ - 2 Score 1 0 0 0 2  Altered sleeping - - - - -  Tired, decreased energy - - - - -  Change in appetite - - - - -  Feeling bad or failure about yourself  - - - - -  Trouble concentrating - - - - -  Suicidal thoughts - - - - -  PHQ-9 Score - - - - -    COPD Feels symptoms are well controlled Using medications without problems Night time symptoms: night ER visits since last visit: none Using O2:no    Relevant past medical, surgical, family and social history reviewed and updated as indicated. Interim medical history since our last visit reviewed. Allergies and medications reviewed and updated.  Review of Systems  Per HPI unless specifically indicated above     Objective:    BP 121/71 mmHg  Pulse 88  Temp(Src) 98.5 F (36.9 C)  Ht 5' 1.7" (1.567 m)  Wt 269 lb 3.2 oz (122.108 kg)  BMI 49.73 kg/m2  SpO2 94%  LMP  (LMP Unknown)  Wt Readings from Last 3 Encounters:  01/28/16 269 lb 3.2 oz (122.108 kg)   01/22/16 265 lb (120.203 kg)  01/21/16 265 lb (120.203 kg)    Physical Exam  Constitutional: She is oriented to person, place, and time. She appears well-developed and well-nourished. No distress.  HENT:  Head: Normocephalic and atraumatic.  Eyes: Conjunctivae and lids are normal. Right eye exhibits no discharge. Left eye exhibits no discharge. No scleral icterus.  Neck: Normal range of motion. Neck supple. No JVD present. Carotid bruit is not present.  Cardiovascular: Normal rate, regular rhythm and normal heart sounds.   Pulmonary/Chest: Effort normal and breath sounds normal.  Abdominal: Normal appearance. There is no splenomegaly or hepatomegaly.  Musculoskeletal:  Uses walker.  Under care of Dr. Josefa Half  Neurological: She is alert and oriented to person, place, and time.  Skin: Skin is warm, dry and intact. No rash noted. No pallor.  Psychiatric: She has a normal mood and affect. Her behavior is normal. Judgment and thought content normal.    Results for orders placed or performed in visit on 12/20/15  Comprehensive metabolic panel  Result Value Ref Range   Glucose 114 (H) 65 - 99 mg/dL   BUN 10 8 - 27 mg/dL   Creatinine, Ser 1.00 0.57 - 1.00 mg/dL   GFR calc non  Af Amer 61 >59 mL/min/1.73   GFR calc Af Amer 71 >59 mL/min/1.73   BUN/Creatinine Ratio 10 (L) 11 - 26   Sodium 140 134 - 144 mmol/L   Potassium 4.0 3.5 - 5.2 mmol/L   Chloride 100 96 - 106 mmol/L   CO2 23 18 - 29 mmol/L   Calcium 9.2 8.7 - 10.3 mg/dL   Total Protein 6.4 6.0 - 8.5 g/dL   Albumin 3.8 3.6 - 4.8 g/dL   Globulin, Total 2.6 1.5 - 4.5 g/dL   Albumin/Globulin Ratio 1.5 1.1 - 2.5   Bilirubin Total <0.2 0.0 - 1.2 mg/dL   Alkaline Phosphatase 87 39 - 117 IU/L   AST 21 0 - 40 IU/L   ALT 15 0 - 32 IU/L      Assessment & Plan:   Problem List Items Addressed This Visit      Unprioritized   COPD, severe ([place])    Stable, continue present medications.        Hyperlipidemia    Check lipid panel       Relevant Orders   Lipid Panel w/o Chol/HDL Ratio   Radiculopathy, lumbar region    Under care of Dr. Josefa Half      Relevant Medications   clonazePAM (KLONOPIN) 1 MG tablet   Major depression ([place])    Stable, continue present medications.        Tobacco abuse    Encouraged to quit smoking      Benign hypertension - Primary    Stable, continue present medications.        Relevant Orders   Comprehensive metabolic panel   Lipid Panel w/o Chol/HDL Ratio       Follow up plan: Return in about 6 months (around 07/27/2016).

## 2016-01-29 LAB — COMPREHENSIVE METABOLIC PANEL
ALT: 11 IU/L (ref 0–32)
AST: 19 IU/L (ref 0–40)
Albumin/Globulin Ratio: 1.6 (ref 1.1–2.5)
Albumin: 3.9 g/dL (ref 3.6–4.8)
Alkaline Phosphatase: 86 IU/L (ref 39–117)
BUN/Creatinine Ratio: 12 (ref 11–26)
BUN: 10 mg/dL (ref 8–27)
Bilirubin Total: <0.2 mg/dL (ref 0.0–1.2)
CO2: 21 mmol/L (ref 18–29)
Calcium: 9.3 mg/dL (ref 8.7–10.3)
Chloride: 101 mmol/L (ref 96–106)
Creatinine, Ser: 0.85 mg/dL (ref 0.57–1.00)
GFR calc Af Amer: 86 mL/min/{1.73_m2} (ref >59)
GFR calc non Af Amer: 75 mL/min/{1.73_m2} (ref >59)
Globulin, Total: 2.5 g/dL (ref 1.5–4.5)
Glucose: 102 mg/dL — ABNORMAL HIGH (ref 65–99)
Potassium: 3.9 mmol/L (ref 3.5–5.2)
Sodium: 142 mmol/L (ref 134–144)
Total Protein: 6.4 g/dL (ref 6.0–8.5)

## 2016-01-29 LAB — LIPID PANEL W/O CHOL/HDL RATIO
Cholesterol, Total: 192 mg/dL (ref 100–199)
HDL: 52 mg/dL (ref >39)
LDL Calculated: 111 mg/dL — ABNORMAL HIGH (ref 0–99)
Triglycerides: 146 mg/dL (ref 0–149)
VLDL Cholesterol Cal: 29 mg/dL (ref 5–40)

## 2016-02-06 ENCOUNTER — Other Ambulatory Visit: Payer: Self-pay | Admitting: Pain Medicine

## 2016-02-17 ENCOUNTER — Encounter: Payer: Self-pay | Admitting: Pain Medicine

## 2016-02-17 ENCOUNTER — Ambulatory Visit: Payer: BLUE CROSS/BLUE SHIELD | Attending: Pain Medicine | Admitting: Pain Medicine

## 2016-02-17 VITALS — BP 119/61 | HR 70 | Temp 98.1°F | Resp 16 | Ht 63.0 in | Wt 265.0 lb

## 2016-02-17 DIAGNOSIS — M1288 Other specific arthropathies, not elsewhere classified, other specified site: Secondary | ICD-10-CM | POA: Insufficient documentation

## 2016-02-17 DIAGNOSIS — Z981 Arthrodesis status: Secondary | ICD-10-CM

## 2016-02-17 DIAGNOSIS — M48062 Spinal stenosis, lumbar region with neurogenic claudication: Secondary | ICD-10-CM

## 2016-02-17 DIAGNOSIS — M170 Bilateral primary osteoarthritis of knee: Secondary | ICD-10-CM

## 2016-02-17 DIAGNOSIS — M545 Low back pain: Secondary | ICD-10-CM | POA: Diagnosis present

## 2016-02-17 DIAGNOSIS — M5416 Radiculopathy, lumbar region: Secondary | ICD-10-CM

## 2016-02-17 DIAGNOSIS — M4806 Spinal stenosis, lumbar region: Secondary | ICD-10-CM | POA: Diagnosis not present

## 2016-02-17 DIAGNOSIS — M5116 Intervertebral disc disorders with radiculopathy, lumbar region: Secondary | ICD-10-CM | POA: Diagnosis not present

## 2016-02-17 DIAGNOSIS — M533 Sacrococcygeal disorders, not elsewhere classified: Secondary | ICD-10-CM | POA: Diagnosis not present

## 2016-02-17 DIAGNOSIS — M79606 Pain in leg, unspecified: Secondary | ICD-10-CM | POA: Diagnosis present

## 2016-02-17 DIAGNOSIS — M47816 Spondylosis without myelopathy or radiculopathy, lumbar region: Secondary | ICD-10-CM

## 2016-02-17 MED ORDER — HYDROCODONE-ACETAMINOPHEN 10-325 MG PO TABS: ORAL_TABLET | ORAL | Status: DC

## 2016-02-17 MED ORDER — CARISOPRODOL 350 MG PO TABS: ORAL_TABLET | ORAL | Status: DC

## 2016-02-17 NOTE — Progress Notes (Signed)
Patient here for medication management Safety precautions to be maintained throughout the outpatient stay will include: orient to surroundings, keep bed in low position, maintain call bell within reach at all times, provide assistance with transfer out of bed and ambulation.  

## 2016-02-17 NOTE — Patient Instructions (Addendum)
PLAN   Continue present medications Soma and hydrocodone acetaminophen. Please take her [name] as discussed and call if you experience any undesirable side effects  Lumbosacral selective nerve root block to be performed at time of return appointment  F/U PCP [name] for evaliation of  BP and general medical  condition.   F/U surgical evaluation as discussed  F/U neurological evaluation. May consider pending follow-up evaluations  May consider radiofrequency rhizolysis or intraspinal procedures pending response to present treatment and F/U evaluation   Patient to call [facility] Management Center should patient have concerns prior to scheduled return appointment.Selective Nerve Root Block Patient Information  Description: Specific nerve roots exit the spinal canal and these nerves can be compressed and inflamed by a bulging disc and bone spurs.  By injecting steroids on the nerve root, we can potentially decrease the inflammation surrounding these nerves, which often leads to decreased pain.  Also, by injecting local anesthesia on the nerve root, this can provide [place] helpful information to give to your referring doctor if it decreases your pain.  Selective nerve root blocks can be done along the spine from the neck to the low back depending on the location of your pain.   After numbing the skin with local anesthesia, a small needle is passed to the nerve root and the position of the needle is verified using x-ray pictures.  After the needle is in correct position, we then deposit the medication.  You may experience a pressure sensation while this is being done.  The entire block usually lasts less than 15 minutes.  Conditions that may be treated with selective nerve root blocks:  Low back and leg pain  Spinal stenosis  Diagnostic block prior to potential surgery  Neck and arm pain  Post laminectomy syndrome  Preparation for the injection:  1. Do not eat any solid food or dairy products within  8 hours of your appointment. 2. You may drink clear liquids up to 3 hours before an appointment.  Clear liquids include water, black coffee, juice or soda.  No milk or cream please. 3. You may take your regular medications, including pain medications, with a sip of water before your appointment.  Diabetics should hold regular insulin (if taken separately) and take 1/2 normal NPH dose the morning of the procedure.  Carry some sugar containing items with you to your appointment. 4. A driver must accompany you and be prepared to drive you home after your procedure. 5. Bring all your current medications with you. 6. An IV may be inserted and sedation may be given at the discretion of the physician. 7. A blood pressure cuff, EKG, and other monitors will often be applied during the procedure.  Some patients may need to have extra oxygen administered for a short period. 8. You will be asked to provide medical information, including allergies, prior to the procedure.  We must know immediately if you are taking blood  Thinners (like Coumadin) or if you are allergic to IV iodine contrast (dye).  Possible side-effects: All are usually temporary  Bleeding from needle site  Light headedness  Numbness and tingling  Decreased blood pressure  Weakness in arms/legs  Pressure sensation in back/neck  Pain at injection site (several days)  Possible complications: All are extremely rare  Infection  Nerve injury  Spinal headache (a headache wore with upright position)  Call if you experience:  Fever/chills associated with headache or increased back/neck pain  Headache worsened by an upright position  [place]  onset weakness or numbness of an extremity below the injection site  Hives or difficulty breathing (go to the emergency room)  Inflammation or drainage at the injection site(s)  Severe back/neck pain greater than usual  New symptoms which are concerning to you  Please  note:  Although the local anesthetic injected can often make your back or neck feel good for several hours after the injection the pain will likely return.  It takes 3-5 days for steroids to work on the nerve root. You may not notice any pain relief for at least one week.  If effective, we will often do a series of 3 injections spaced 3-6 weeks apart to maximally decrease your pain.    If you have any questions, please call [phone] [facility] Clinic[facility] R[facility]KS AND COMPLICATIONS  What are the risk, side effects and possible complications? Generally speaking, most procedures are safe.  However, with any procedure there are risks, side effects, and the possibility of complications.  The risks and complications are dependent upon the sites that are lesioned, or the type of nerve block to be performed.  The closer the procedure is to the spine, the more serious the risks are.  Great care is taken when placing the radio frequency needles, block needles or lesioning probes, but sometimes complications can occur. 1. Infection: Any time there is an injection through the skin, there is a risk of infection.  This is why sterile conditions are used for these blocks.  There are four possible types of infection. 1. Localized skin infection. 2. Central Nervous System Infection-This can be in the form of Meningitis, which can be deadly. 3. Epidural Infections-This can be in the form of an epidural abscess, which can cause pressure inside of the spine, causing compression of the spinal cord with subsequent paralysis. This would require an emergency surgery to decompress, and there are no guarantees that the patient would recover from the paralysis. 4. Discitis-This is an infection of the intervertebral discs.  It occurs in about 1% of discography procedures.  It is difficult to treat and it may lead to surgery.        2. Pain: the needles have to go through skin and soft tissues,  will cause soreness.       3. Damage to internal structures:  The nerves to be lesioned may be near blood vessels or    other nerves which can be potentially damaged.       4. Bleeding: Bleeding is more common if the patient is taking blood thinners such as  aspirin, Coumadin, Ticiid, Plavix, etc., or if he/she have some genetic predisposition  such as hemophilia. Bleeding into the spinal canal can cause compression of the spinal  cord with subsequent paralysis.  This would require an emergency surgery to  decompress and there are no guarantees that the patient would recover from the  paralysis.       5. Pneumothorax:  Puncturing of a lung is a possibility, every time a needle is introduced in  the area of the chest or upper back.  Pneumothorax refers to free air around the  collapsed lung(s), inside of the thoracic cavity (chest cavity).  Another two possible  complications related to a similar event would include: Hemothorax and Chylothorax.   These are variations of the Pneumothorax, where instead of air around the collapsed  lung(s), you may have blood or chyle, respectively.       6. Spinal headaches: They may  occur with any procedures in the area of the spine.       7. Persistent CSF (Cerebro-Spinal Fluid) leakage: This is a rare problem, but may occur  with prolonged intrathecal or epidural catheters either due to the formation of a fistulous  track or a dural tear.       8. Nerve damage: By working so close to the spinal cord, there is always a possibility of  nerve damage, which could be as serious as a permanent spinal cord injury with  paralysis.       9. Death:  Although rare, severe deadly allergic reactions known as "Anaphylactic  reaction" can occur to any of the medications used.      10. Worsening of the symptoms:  We can always make thing worse.  What are the chances of something like this happening? Chances of any of this occuring are extremely low.  By statistics, you have more of a  chance of getting killed in a motor vehicle accident: while driving to the hospital than any of the above occurring .  Nevertheless, you should be aware that they are possibilities.  In general, it is similar to taking a shower.  Everybody knows that you can slip, hit your head and get killed.  Does that mean that you should not shower again?  Nevertheless always keep in mind that statistics do not mean anything if you happen to be on the wrong side of them.  Even if a procedure has a 1 (one) in a 1,000,000 (million) chance of going wrong, it you happen to be that one..Also, keep in mind that by statistics, you have more of a chance of having something go wrong when taking medications.  Who should not have this procedure? If you are on a blood thinning medication (e.g. Coumadin, Plavix, see list of "Blood Thinners"), or if you have an active infection going on, you should not have the procedure.  If you are taking any blood thinners, please inform your physician.  How should I prepare for this procedure?  Do not eat or drink anything at least six hours prior to the procedure.  Bring a driver with you .  It cannot be a taxi.  Come accompanied by an adult that can drive you back, and that is strong enough to help you if your legs get weak or numb from the local anesthetic.  Take all of your medicines the morning of the procedure with just enough water to swallow them.  If you have diabetes, make sure that you are scheduled to have your procedure done first thing in the morning, whenever possible.  If you have diabetes, take only half of your insulin dose and notify our nurse that you have done so as soon as you arrive at the clinic.  If you are diabetic, but only take blood sugar pills (oral hypoglycemic), then do not take them on the morning of your procedure.  You may take them after you have had the procedure.  Do not take aspirin or any aspirin-containing medications, at least eleven (11) days  prior to the procedure.  They may prolong bleeding.  Wear loose fitting clothing that may be easy to take off and that you would not mind if it got stained with Betadine or blood.  Do not wear any jewelry or perfume  Remove any nail coloring.  It will interfere with some of our monitoring equipment.  NOTE: Remember that this is not meant to be interpreted  as a complete list of all possible complications.  Unforeseen problems may occur.  BLOOD THINNERS The following drugs contain aspirin or other products, which can cause increased bleeding during surgery and should not be taken for 2 weeks prior to and 1 week after surgery.  If you should need take something for relief of minor pain, you may take acetaminophen which is found in Tylenol,m Datril, Anacin-3 and Panadol. It is not blood thinner. The products listed below are.  Do not take any of the products listed below in addition to any listed on your instruction sheet.  A.P.C or A.P.C with Codeine Codeine Phosphate Capsules #3 Ibuprofen Ridaura  ABC compound Congesprin Imuran rimadil  Advil Cope Indocin Robaxisal  Alka-Seltzer Effervescent Pain Reliever and Antacid Coricidin or Coricidin-D  Indomethacin Rufen  Alka-Seltzer plus Cold Medicine Cosprin Ketoprofen S-A-C Tablets  Anacin Analgesic Tablets or Capsules Coumadin Korlgesic Salflex  Anacin Extra Strength Analgesic tablets or capsules CP-2 Tablets Lanoril Salicylate  Anaprox Cuprimine Capsules Levenox Salocol  Anexsia-D Dalteparin Magan Salsalate  Anodynos Darvon compound Magnesium Salicylate Sine-off  Ansaid Dasin Capsules Magsal Sodium Salicylate  Anturane Depen Capsules Marnal Soma  APF Arthritis pain formula Dewitt's Pills Measurin Stanback  Argesic Dia-Gesic Meclofenamic Sulfinpyrazone  Arthritis Bayer Timed Release Aspirin Diclofenac Meclomen Sulindac  Arthritis pain formula Anacin Dicumarol Medipren Supac  Analgesic (Safety coated) Arthralgen Diffunasal Mefanamic Suprofen   Arthritis Strength Bufferin Dihydrocodeine Mepro Compound Suprol  Arthropan liquid Dopirydamole Methcarbomol with Aspirin Synalgos  ASA tablets/Enseals Disalcid Micrainin Tagament  Ascriptin Doan's Midol Talwin  Ascriptin A/D Dolene Mobidin Tanderil  Ascriptin Extra Strength Dolobid Moblgesic Ticlid  Ascriptin with Codeine Doloprin or Doloprin with Codeine Momentum Tolectin  Asperbuf Duoprin Mono-gesic Trendar  Aspergum Duradyne Motrin or Motrin IB Triminicin  Aspirin plain, buffered or enteric coated Durasal Myochrisine Trigesic  Aspirin Suppositories Easprin Nalfon Trillsate  Aspirin with Codeine Ecotrin Regular or Extra Strength Naprosyn Uracel  Atromid-S Efficin Naproxen Ursinus  Auranofin Capsules Elmiron Neocylate Vanquish  Axotal Emagrin Norgesic Verin  Azathioprine Empirin or Empirin with Codeine Normiflo Vitamin E  Azolid Emprazil Nuprin Voltaren  Bayer Aspirin plain, buffered or children's or timed BC Tablets or powders Encaprin Orgaran Warfarin Sodium  Buff-a-Comp Enoxaparin Orudis Zorpin  Buff-a-Comp with Codeine Equegesic Os-Cal-Gesic   Buffaprin Excedrin plain, buffered or Extra Strength Oxalid   Bufferin Arthritis Strength Feldene Oxphenbutazone   Bufferin plain or Extra Strength Feldene Capsules Oxycodone with Aspirin   Bufferin with Codeine Fenoprofen Fenoprofen Pabalate or Pabalate-SF   Buffets II Flogesic Panagesic   Buffinol plain or Extra Strength Florinal or Florinal with Codeine Panwarfarin   Buf-Tabs Flurbiprofen Penicillamine   Butalbital Compound Four-way cold tablets Penicillin   Butazolidin Fragmin Pepto-Bismol   Carbenicillin Geminisyn Percodan   Carna Arthritis Reliever Geopen Persantine   Carprofen Gold's salt Persistin   Chloramphenicol Goody's Phenylbutazone   Chloromycetin Haltrain Piroxlcam   Clmetidine heparin Plaquenil   Cllnoril Hyco-pap Ponstel   Clofibrate Hydroxy chloroquine Propoxyphen         Before stopping any of these medications,  be sure to consult the physician who ordered them.  Some, such as Coumadin (Warfarin) are ordered to prevent or treat serious conditions such as "deep thrombosis", "pumonary embolisms", and other heart problems.  The amount of time that you may need off of the medication may also vary with the medication and the reason for which you were taking it.  If you are taking any of these medications, please make sure you notify your pain physician  before you undergo any procedures.

## 2016-02-17 NOTE — Progress Notes (Signed)
Subjective:    Patient ID: [name], female    DOB: 04-06-1956, 60 y.o.   MRN: [id number]  HPI  The patient is a 60 year old female who returns to pain management for further evaluation and treatment of pain involving the region of the lower back and lower extremity regions predominantly. The patient states that she has significant improvement of pain which is involving the region of the knee and that her predominant pain at the present time consist of pain radiating from the back of the lower extremity on the right The patient denies any trauma change in events of daily living to cause change in symptomatology. The patient states that the pain is quite severe after standing and walking for any short period of time. We discussed patient's condition and will consider patient for lumbosacral selective nerve root block to be performed at time of return appointment. The patient will continue her medications consisting of Soma and hydrocodone acetaminophen. We will also discussed additional studies including PNCV EMG studies and surgical evaluation. We will consider such studies and treatment as it as well as evaluations pending response to present treatment. The patient was with understanding and agreement suggested treatment plan.         Review of Systems     Objective:   Physical Exam  There was tenderness over the splenius capitis and occipitalis musculature regions palpation which be produced pain of mild degree with mild tenderness of the acromioclavicular and glenohumeral joint regions. The patient was with mild tenderness of the cervical facet cervical paraspinal musculature region and mild tenderness over the thoracic facet thoracic paraspinal musculature region. There was evidence of mild muscle spasms involving the mid thoracic region and moderate muscle spasms of the lower thoracic region. There was no crepitus of the thoracic region noted. The patient appeared to be with  bilaterally equal grip strength and Tinel and Phalen's maneuver were without increased pain of significant degree. Palpation over the lumbar paraspinal muscular treat and lumbar facet region was attends to palpation of moderate to moderately severe degree on the right with tenderness over the PSIS and PII S region a moderate degree. Straight leg raise was tolerates approximately 20 with questionable increased pain with dorsiflexion noted. No definite sensory deficit or dermatomal distribution detected. The knee was with minimal tenderness to palpation with crepitus of the knees with negative anterior and posterior drawer signs without ballottement of the patella. No increased warmth and erythema in the region of the knee was noted. It was negative clonus negative Homans. Predominant portion of patient's pain was reproduced with straight leg raising on the right compared to the left with questionable increased pain with dorsiflexion noted. Was nontender with no costovertebral tenderness noted      Assessment & Plan:     Degenerative disc disease lumbar spine Most predominant at L4-5. Multilevel facet arthropathy at L2-3, L3-4, L4-5, with disc space narrowing as well Disc desiccation at T12-L1 to L4-L5  Lumbar facet syndrome  Lumbar radiculopathy  Lumbar stenosis  Sacroiliac joint dysfunction      PLAN   Continue present medications Soma and hydrocodone acetaminophen. Please take her [name] as discussed and call if you experience any undesirable side effects  Lumbosacral selective nerve root block to be performed at time of return appointment  F/U PCP [name] for evaliation of  BP and general medical  condition.   F/U surgical evaluation as discussed  F/U neurological evaluation. May consider pending follow-up evaluations  May consider radiofrequency  rhizolysis or intraspinal procedures pending response to present treatment and F/U evaluation   Patient to call Pain Management  Center should patient have concerns prior to scheduled return appointment.

## 2016-02-26 ENCOUNTER — Encounter: Payer: Self-pay | Admitting: Pain Medicine

## 2016-02-26 ENCOUNTER — Ambulatory Visit: Payer: BLUE CROSS/BLUE SHIELD | Attending: Pain Medicine | Admitting: Pain Medicine

## 2016-02-26 VITALS — BP 120/56 | HR 65 | Temp 98.1°F | Resp 16 | Ht 63.0 in | Wt 265.0 lb

## 2016-02-26 DIAGNOSIS — M5136 Other intervertebral disc degeneration, lumbar region: Secondary | ICD-10-CM | POA: Insufficient documentation

## 2016-02-26 DIAGNOSIS — M79606 Pain in leg, unspecified: Secondary | ICD-10-CM | POA: Insufficient documentation

## 2016-02-26 DIAGNOSIS — M48062 Spinal stenosis, lumbar region with neurogenic claudication: Secondary | ICD-10-CM

## 2016-02-26 DIAGNOSIS — M545 Low back pain: Secondary | ICD-10-CM | POA: Diagnosis present

## 2016-02-26 DIAGNOSIS — M1288 Other specific arthropathies, not elsewhere classified, other specified site: Secondary | ICD-10-CM | POA: Insufficient documentation

## 2016-02-26 DIAGNOSIS — M170 Bilateral primary osteoarthritis of knee: Secondary | ICD-10-CM

## 2016-02-26 DIAGNOSIS — M533 Sacrococcygeal disorders, not elsewhere classified: Secondary | ICD-10-CM

## 2016-02-26 DIAGNOSIS — Z981 Arthrodesis status: Secondary | ICD-10-CM

## 2016-02-26 DIAGNOSIS — M47816 Spondylosis without myelopathy or radiculopathy, lumbar region: Secondary | ICD-10-CM

## 2016-02-26 DIAGNOSIS — M5416 Radiculopathy, lumbar region: Secondary | ICD-10-CM

## 2016-02-26 MED ORDER — MIDAZOLAM HCL 5 MG/5ML IJ SOLN
INTRAMUSCULAR | Status: AC
  Administered 2016-02-26: 3 mg via INTRAVENOUS
  Filled 2016-02-26: qty 5

## 2016-02-26 MED ORDER — ORPHENADRINE CITRATE 30 MG/ML IJ SOLN: 60.0000 mg | Freq: Once | INTRAMUSCULAR | Status: DC

## 2016-02-26 MED ORDER — BUPIVACAINE HCL (PF) 0.25 % IJ SOLN: 30.0000 mL | Freq: Once | INTRAMUSCULAR | Status: DC

## 2016-02-26 MED ORDER — CEFAZOLIN SODIUM 1-5 GM-% IV SOLN: 1.0000 g | Freq: Once | INTRAVENOUS | Status: DC

## 2016-02-26 MED ORDER — KETOROLAC TROMETHAMINE 60 MG/2ML IM SOLN
INTRAMUSCULAR | Status: AC
  Administered 2016-02-26: 14:00:00
  Filled 2016-02-26: qty 2

## 2016-02-26 MED ORDER — MIDAZOLAM HCL 5 MG/5ML IJ SOLN
INTRAMUSCULAR | Status: AC
  Filled 2016-02-26: qty 5

## 2016-02-26 MED ORDER — LIDOCAINE HCL (PF) 1 % IJ SOLN: 10.0000 mL | Freq: Once | INTRAMUSCULAR | Status: DC

## 2016-02-26 MED ORDER — LACTATED RINGERS IV SOLN
1000.0000 mL | INTRAVENOUS | Status: DC
Start: 2016-02-26 — End: 2018-06-28

## 2016-02-26 MED ORDER — FENTANYL CITRATE (PF) 100 MCG/2ML IJ SOLN
INTRAMUSCULAR | Status: AC
  Administered 2016-02-26: 100 ug via INTRAVENOUS
  Filled 2016-02-26: qty 2

## 2016-02-26 MED ORDER — CEFAZOLIN SODIUM 1 G IJ SOLR
INTRAMUSCULAR | Status: AC
  Administered 2016-02-26: 1 g via INTRAVENOUS
  Filled 2016-02-26: qty 10

## 2016-02-26 MED ORDER — LIDOCAINE HCL (PF) 1 % IJ SOLN
INTRAMUSCULAR | Status: AC
  Filled 2016-02-26: qty 10

## 2016-02-26 MED ORDER — FENTANYL CITRATE (PF) 100 MCG/2ML IJ SOLN: 100.0000 ug | Freq: Once | INTRAMUSCULAR | Status: DC

## 2016-02-26 MED ORDER — MIDAZOLAM HCL 5 MG/5ML IJ SOLN: 5.0000 mg | Freq: Once | INTRAMUSCULAR | Status: DC

## 2016-02-26 MED ORDER — TRIAMCINOLONE ACETONIDE 40 MG/ML IJ SUSP: 40.0000 mg | Freq: Once | INTRAMUSCULAR | Status: DC

## 2016-02-26 MED ORDER — ORPHENADRINE CITRATE 30 MG/ML IJ SOLN
INTRAMUSCULAR | Status: AC
  Administered 2016-02-26: 13:00:00
  Filled 2016-02-26: qty 2

## 2016-02-26 MED ORDER — CEFUROXIME AXETIL 250 MG PO TABS: 250.0000 mg | ORAL_TABLET | Freq: Two times a day (BID) | ORAL | Status: DC

## 2016-02-26 MED ORDER — BUPIVACAINE HCL (PF) 0.25 % IJ SOLN
INTRAMUSCULAR | Status: AC
  Administered 2016-02-26: 13:00:00
  Filled 2016-02-26: qty 30

## 2016-02-26 MED ORDER — TRIAMCINOLONE ACETONIDE 40 MG/ML IJ SUSP
INTRAMUSCULAR | Status: AC
  Administered 2016-02-26: 13:00:00
  Filled 2016-02-26: qty 1

## 2016-02-26 NOTE — Progress Notes (Signed)
Safety precautions to be maintained throughout the outpatient stay will include: orient to surroundings, keep bed in low position, maintain call bell within reach at all times, provide assistance with transfer out of bed and ambulation.  

## 2016-02-26 NOTE — Patient Instructions (Addendum)
PLAN   Continue present medications Soma and hydrocodone acetaminophen. Please get Ceftin antibiotic today and begin taking Ceftin antibiotic as prescribed  F/U PCP [name] for evaliation of  BP and general medical  condition. Please follow-up with [name] as discussed  F/U surgical evaluation. Neurosurgical evaluation as discussed  TENS unit as discussed  F/U neurological evaluation. May consider PNCV/EMG studies and other studies pending follow-up evaluations  May consider radiofrequency rhizolysis or intraspinal procedures pending response to present treatment and F/U evaluation   Patient to call Pain Management Center should patient have concerns prior to scheduled return appointment.Pain Management Discharge Instructions  General Discharge Instructions :  If you need to reach your doctor call: Monday-Friday 8:00 am - 4:00 pm at [phone] or toll free [phone].  After clinic hours [phone] to have operator reach doctor.  Bring all of your medication bottles to all your appointments in the pain clinic.  To cancel or reschedule your appointment with Pain Management please remember to call 24 hours in advance to avoid a fee.  Refer to the educational materials which you have been given on: General Risks, I had my Procedure. Discharge Instructions, Post Sedation.  Post Procedure Instructions:  The drugs you were given will stay in your system until tomorrow, so for the next 24 hours you should not drive, make any legal decisions or drink any alcoholic beverages.  You may eat anything you prefer, but it is better to start with liquids then soups and crackers, and gradually work up to solid foods.  Please notify your doctor immediately if you have any unusual bleeding, trouble breathing or pain that is not related to your normal pain.  Depending on the type of procedure that was done, some parts of your body may feel week and/or numb.  This usually clears up by tonight  or the next day.  Walk with the use of an assistive device or accompanied by an adult for the 24 hours.  You may use ice on the affected area for the first 24 hours.  Put ice in a Ziploc bag and cover with a towel and place against area 15 minutes on 15 minutes off.  You may switch to heat after 24 hours.

## 2016-02-26 NOTE — Progress Notes (Signed)
Subjective:    Patient ID: [name], female    DOB: 06/17/1956, 60 y.o.   MRN: [id number]  HPI PROCEDURE PERFORMED: Lumbosacral selective nerve root block   NOTE: The patient is a 60 y.o. female who returns to [place] for further evaluation and treatment of pain involving the lumbar and lower extremity region. Studies consisting of MRI revealed degenerative disc disease lumbar spine Most predominant at L4-5. 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 Marland Kitchen There is concern regarding component of patient's pain being due to lumbar radiculopathy and lumbar stenosis The risks, benefits, and expectations of the procedure have been explained to the patient who was understanding and in agreement with suggested treatment plan. We will proceed with interventional treatment as discussed and as explained to the patient. The patient is understanding and in agreement with suggested treatment plan.   DESCRIPTION OF PROCEDURE: Lumbosacral selective nerve root block with IV Versed, IV fentanyl conscious sedation, EKG, blood pressure, pulse, and pulse oximetry monitoring. The procedure was performed with the patient in the prone position under fluoroscopic guidance. With the patient in the prone position, Betadine prep of proposed entry site was performed. Local anesthetic skin wheal of proposed needle entry site was prepared with 1.5% plain lidocaine with AP view of the lumbosacral spine.   PROCEDURE #1: Needle placement at the right L 2 vertebral body: A 22 -gauge needle was inserted at the inferior border of the transverse process of the vertebral body with needle placed medial to the midline of the transverse process on AP view of the lumbosacral spine.   NEEDLE PLACEMENT AT  L3, L4, and L5  VERTEBRAL BODY LEVELS  Needle  placement was accomplished at L3, L4, and L5  vertebral body levels on the right side exactly  as was accomplished at the L2  vertebral body level  and utilizing the same technique and under fluoroscopic guidance.    Needle placement was then verified on lateral view at all levels with needle tip documented to be in the posterior superior quadrant of the intervertebral foramen of  L 2, L3, L4, and L5. Following negative aspiration for heme and CSF at each level, each level was injected with 3 mL of 0.25% bupivacaine with Kenalog.   Myoneural block injection of the gluteal musculature region Following Betadine prep of proposed entry site a 22-gauge needle was inserted in the gluteal musculature region on the right and following negative aspiration 4 cc of 0.25% bupivacaine with Toradol was injected for myoneural block injection 2.   The patient tolerated the procedure well.   A total of 10 mg of Kenalog was utilized for the procedure.   PLAN:  1. Medications: Will continue presently prescribed medications Soma and hydrocodone acetaminophen 2. The patient is to undergo follow-up evaluation with PCP [name] for evaluation of blood pressure and general medical condition status post procedure performed on today's visit. 3. Surgical follow-up evaluation. Has been addressed 4. Neurological evaluation. May consider PNCV EMG studies and other studies 5. May consider radiofrequency procedures, implantation type procedures and other treatment pending response to treatment and follow-up evaluation. 6. The patient has been advise do adhere to proper body mechanics and avoid activities which may aggravate condition. 7. The patient has been advised to call the Pain Management Center prior to scheduled return appointment should there be significant change in the patient's condition or should the patient have other concerns  regarding condition prior to scheduled return appointment.    Review of Systems     Objective:   Physical Exam        Assessment & Plan:

## 2016-02-27 ENCOUNTER — Telehealth: Payer: Self-pay | Admitting: *Deleted

## 2016-02-27 NOTE — Telephone Encounter (Signed)
Message left

## 2016-02-28 ENCOUNTER — Other Ambulatory Visit: Payer: Self-pay | Admitting: Unknown Physician Specialty

## 2016-02-28 NOTE — Telephone Encounter (Signed)
Your patient 

## 2016-03-17 ENCOUNTER — Encounter: Payer: Self-pay | Admitting: Pain Medicine

## 2016-03-17 ENCOUNTER — Ambulatory Visit: Payer: BLUE CROSS/BLUE SHIELD | Attending: Pain Medicine | Admitting: Pain Medicine

## 2016-03-17 VITALS — BP 159/68 | HR 89 | Temp 97.6°F | Resp 16 | Ht 63.0 in | Wt 269.0 lb

## 2016-03-17 DIAGNOSIS — Z981 Arthrodesis status: Secondary | ICD-10-CM

## 2016-03-17 DIAGNOSIS — M533 Sacrococcygeal disorders, not elsewhere classified: Secondary | ICD-10-CM

## 2016-03-17 DIAGNOSIS — M48062 Spinal stenosis, lumbar region with neurogenic claudication: Secondary | ICD-10-CM

## 2016-03-17 DIAGNOSIS — M5416 Radiculopathy, lumbar region: Secondary | ICD-10-CM

## 2016-03-17 DIAGNOSIS — M1288 Other specific arthropathies, not elsewhere classified, other specified site: Secondary | ICD-10-CM | POA: Insufficient documentation

## 2016-03-17 DIAGNOSIS — M5116 Intervertebral disc disorders with radiculopathy, lumbar region: Secondary | ICD-10-CM | POA: Insufficient documentation

## 2016-03-17 DIAGNOSIS — M79606 Pain in leg, unspecified: Secondary | ICD-10-CM | POA: Diagnosis present

## 2016-03-17 DIAGNOSIS — M4806 Spinal stenosis, lumbar region: Secondary | ICD-10-CM | POA: Diagnosis not present

## 2016-03-17 DIAGNOSIS — M47816 Spondylosis without myelopathy or radiculopathy, lumbar region: Secondary | ICD-10-CM

## 2016-03-17 DIAGNOSIS — M545 Low back pain: Secondary | ICD-10-CM | POA: Diagnosis present

## 2016-03-17 DIAGNOSIS — M170 Bilateral primary osteoarthritis of knee: Secondary | ICD-10-CM

## 2016-03-17 MED ORDER — CARISOPRODOL 350 MG PO TABS: ORAL_TABLET | ORAL | Status: DC

## 2016-03-17 MED ORDER — HYDROCODONE-ACETAMINOPHEN 10-325 MG PO TABS: ORAL_TABLET | ORAL | Status: DC

## 2016-03-17 NOTE — Progress Notes (Signed)
Safety precautions to be maintained throughout the outpatient stay will include: orient to surroundings, keep bed in low position, maintain call bell within reach at all times, provide assistance with transfer out of bed and ambulation.  

## 2016-03-17 NOTE — Progress Notes (Signed)
Subjective:    Patient ID: [name], female    DOB: Apr 23, 1956, 60 y.o.   MRN: [id number]  HPI  The patient is a 60 year old female who returns to pain management for further evaluation and treatment of pain involving the lower back and lower extremity region. The patient states the pain is aggravated by standing and walking and is associated with weakness of the standing and walking for significant period of time. The patient denies any trauma change in events of daily living because change in symptomatology. We discussed patient's condition and there is concern regarding patient being with what appears to be lumbar stenosis with neurogenic claudication. Refill the patient will benefit from lumbar epidural steroid injection. We will request medical clearance for patient to undergo lumbar epidural steroid injection and patient will follow-up with primary care physician [name] to be considered for lumbar epidural steroid injection. There is concern regarding patient's lower extremity swelling as well as blood pressure and general medical condition in terms of response to lumbar epidural steroid Dosepak. The patient will follow-up with primary care physician as discussed and we will consider patient for lumbar epidural steroid injection if we have pain medical clearance. All were understanding and agreement suggested treatment plan. Patient will continue , And hydrocodone acetaminophen at this time    Review of Systems     Objective:   Physical Exam  There was tenderness of the splenius capitis and occipitalis musculature region of mild degree with mild tenderness of the cervical facet cervical paraspinal musculature region. Palpation of the acromioclavicular and glenohumeral joint regions reproduced pain of moderate degree. There appeared to be unremarkable Spurling's maneuver and patient appeared to be with bilaterally equal grip strength. Tinel and Phalen's maneuver were without increase  of pain of significant degree palpation over the thoracic paraspinal must reason thoracic facet region was with no crepitus of the thoracic region noted.. Palpation over the region of the lumbar paraspinal must reason lumbar facet region was attends to palpation of moderate degree with lateral bending rotation extension and palpation of the lumbar facets reproducing moderate discomfort. Palpation over the PSIS and PII S region reproduces moderate discomfort with palpation of the gluteal and piriformis musculature region reproducing moderate discomfort as well straight leg raise was tolerates approximately 20 with concern regarding increased pain with dorsiflexion being noted. EHL strength appeared to be decreased. There was questionably decreased sensation of the L5 dermatomal distribution with negative clonus negative Homans. DTRs difficult to elicit. Knees were attends to palpation with crepitus of the knees with negative anterior and posterior drawer signs without ballottement of the patella. There was negative clonus negative Homans. 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 Multilevel facet arthropathy at L2-3, L3-4, L4-5, with disc space narrowing as well Disc desiccation at T12-L1 to L4-L5  Lumbar facet syndrome  Lumbar radiculopathy  Lumbar stenosis  Sacroiliac joint dysfunction    PLAN   Continue present medications Soma and hydrocodone acetaminophen.  Call if you experience any undesirable side effects due to medications  Lumbar epidural steroid injection to be performed at time of return appointmentPending medical clearance by [name]   F/U PCP [name] for evaliation of  BP and general medical  condition and for medical clearance for lumbar epidural steroid injection.   F/U surgical evaluation as discussed  F/U neurological evaluation. May consider pending  follow-up  evaluations  May consider radiofrequency rhizolysis or intraspinal procedures pending response to present treatment and F/U evaluation   Patient to call Pain Management Center should patient have concerns prior to scheduled return appointment.

## 2016-03-17 NOTE — Patient Instructions (Addendum)
PLAN   Continue present medications Soma and hydrocodone acetaminophen.  Call if you experience any undesirable side effects due to medications  Lumbar epidural steroid injection to be performed at time of return appointment  F/U PCP [name] for evaliation of  BP and general medical  condition.   F/U surgical evaluation as discussed  F/U neurological evaluation. May consider pending follow-up evaluations  May consider radiofrequency rhizolysis or intraspinal procedures pending response to present treatment and F/U evaluation   Patient to call [facility] should patient have concerns prior to scheduled return appointment.GENERAL RISKS AND COMPLICATIONS  What are the risk, side effects and possible complications? Generally speaking, most procedures are safe.  However, with any procedure there are risks, side effects, and the possibility of complications.  The risks and complications are dependent upon the sites that are lesioned, or the type of nerve block to be performed.  The closer the procedure is to the spine, the more serious the risks are.  Great care is taken when placing the radio frequency needles, block needles or lesioning probes, but sometimes complications can occur. 1. Infection: Any time there is an injection through the skin, there is a risk of infection.  This is why sterile conditions are used for these blocks.  There are four possible types of infection. 1. Localized skin infection. 2. Central Nervous System Infection-This can be in the form of Meningitis, which can be deadly. 3. Epidural Infections-This can be in the form of an epidural abscess, which can cause pressure inside of the spine, causing compression of the spinal cord with subsequent paralysis. This would require an emergency surgery to decompress, and there are no guarantees that the patient would recover from the paralysis. 4. Discitis-This is an infection of the intervertebral discs.  It occurs in  about 1% of discography procedures.  It is difficult to treat and it may lead to surgery.        2. Pain: the needles have to go through skin and soft tissues, will cause soreness.       3. Damage to internal structures:  The nerves to be lesioned may be near blood vessels or    other nerves which can be potentially damaged.       4. Bleeding: Bleeding is more common if the patient is taking blood thinners such as  aspirin, Coumadin, Ticiid, Plavix, etc., or if he/she have some genetic predisposition  such as hemophilia. Bleeding into the spinal canal can cause compression of the spinal  cord with subsequent paralysis.  This would require an emergency surgery to  decompress and there are no guarantees that the patient would recover from the  paralysis.       5. Pneumothorax:  Puncturing of a lung is a possibility, every time a needle is introduced in  the area of the chest or upper back.  Pneumothorax refers to free air around the  collapsed lung(s), inside of the thoracic cavity (chest cavity).  Another two possible  complications related to a similar event would include: Hemothorax and Chylothorax.   These are variations of the Pneumothorax, where instead of air around the collapsed  lung(s), you may have blood or chyle, respectively.       6. Spinal headaches: They may occur with any procedures in the area of the spine.       7. Persistent CSF (Cerebro-Spinal Fluid) leakage: This is a rare problem, but may occur  with prolonged intrathecal or epidural catheters either due  to the formation of a fistulous  track or a dural tear.       8. Nerve damage: By working so close to the spinal cord, there is always a possibility of  nerve damage, which could be as serious as a permanent spinal cord injury with  paralysis.       9. Death:  Although rare, severe deadly allergic reactions known as "Anaphylactic  reaction" can occur to any of the medications used.      10. Worsening of the symptoms:  We can always  make thing worse.  What are the chances of something like this happening? Chances of any of this occuring are extremely low.  By statistics, you have more of a chance of getting killed in a motor vehicle accident: while driving to the hospital than any of the above occurring .  Nevertheless, you should be aware that they are possibilities.  In general, it is similar to taking a shower.  Everybody knows that you can slip, hit your head and get killed.  Does that mean that you should not shower again?  Nevertheless always keep in mind that statistics do not mean anything if you happen to be on the wrong side of them.  Even if a procedure has a 1 (one) in a 1,000,000 (million) chance of going wrong, it you happen to be that one..Also, keep in mind that by statistics, you have more of a chance of having something go wrong when taking medications.  Who should not have this procedure? If you are on a blood thinning medication (e.g. Coumadin, Plavix, see list of "Blood Thinners"), or if you have an active infection going on, you should not have the procedure.  If you are taking any blood thinners, please inform your physician.  How should I prepare for this procedure?  Do not eat or drink anything at least six hours prior to the procedure.  Bring a driver with you .  It cannot be a taxi.  Come accompanied by an adult that can drive you back, and that is strong enough to help you if your legs get weak or numb from the local anesthetic.  Take all of your medicines the morning of the procedure with just enough water to swallow them.  If you have diabetes, make sure that you are scheduled to have your procedure done first thing in the morning, whenever possible.  If you have diabetes, take only half of your insulin dose and notify our nurse that you have done so as soon as you arrive at the clinic.  If you are diabetic, but only take blood sugar pills (oral hypoglycemic), then do not take them on the  morning of your procedure.  You may take them after you have had the procedure.  Do not take aspirin or any aspirin-containing medications, at least eleven (11) days prior to the procedure.  They may prolong bleeding.  Wear loose fitting clothing that may be easy to take off and that you would not mind if it got stained with Betadine or blood.  Do not wear any jewelry or perfume  Remove any nail coloring.  It will interfere with some of our monitoring equipment.  NOTE: Remember that this is not meant to be interpreted as a complete list of all possible complications.  Unforeseen problems may occur.  BLOOD THINNERS The following drugs contain aspirin or other products, which can cause increased bleeding during surgery and should not be taken for 2 weeks  prior to and 1 week after surgery.  If you should need take something for relief of minor pain, you may take acetaminophen which is found in Tylenol,m Datril, Anacin-3 and Panadol. It is not blood thinner. The products listed below are.  Do not take any of the products listed below in addition to any listed on your instruction sheet.  A.P.C or A.P.C with Codeine Codeine Phosphate Capsules #3 Ibuprofen Ridaura  ABC compound Congesprin Imuran rimadil  Advil Cope Indocin Robaxisal  Alka-Seltzer Effervescent Pain Reliever and Antacid Coricidin or Coricidin-D  Indomethacin Rufen  Alka-Seltzer plus Cold Medicine Cosprin Ketoprofen S-A-C Tablets  Anacin Analgesic Tablets or Capsules Coumadin Korlgesic Salflex  Anacin Extra Strength Analgesic tablets or capsules CP-2 Tablets Lanoril Salicylate  Anaprox Cuprimine Capsules Levenox Salocol  Anexsia-D Dalteparin Magan Salsalate  Anodynos Darvon compound Magnesium Salicylate Sine-off  Ansaid Dasin Capsules Magsal Sodium Salicylate  Anturane Depen Capsules Marnal Soma  APF Arthritis pain formula Dewitt's Pills Measurin Stanback  Argesic Dia-Gesic Meclofenamic Sulfinpyrazone  Arthritis Bayer Timed  Release Aspirin Diclofenac Meclomen Sulindac  Arthritis pain formula Anacin Dicumarol Medipren Supac  Analgesic (Safety coated) Arthralgen Diffunasal Mefanamic Suprofen  Arthritis Strength Bufferin Dihydrocodeine Mepro Compound Suprol  Arthropan liquid Dopirydamole Methcarbomol with Aspirin Synalgos  ASA tablets/Enseals Disalcid Micrainin Tagament  Ascriptin Doan's Midol Talwin  Ascriptin A/D Dolene Mobidin Tanderil  Ascriptin Extra Strength Dolobid Moblgesic Ticlid  Ascriptin with Codeine Doloprin or Doloprin with Codeine Momentum Tolectin  Asperbuf Duoprin Mono-gesic Trendar  Aspergum Duradyne Motrin or Motrin IB Triminicin  Aspirin plain, buffered or enteric coated Durasal Myochrisine Trigesic  Aspirin Suppositories Easprin Nalfon Trillsate  Aspirin with Codeine Ecotrin Regular or Extra Strength Naprosyn Uracel  Atromid-S Efficin Naproxen Ursinus  Auranofin Capsules Elmiron Neocylate Vanquish  Axotal Emagrin Norgesic Verin  Azathioprine Empirin or Empirin with Codeine Normiflo Vitamin E  Azolid Emprazil Nuprin Voltaren  Bayer Aspirin plain, buffered or children's or timed BC Tablets or powders Encaprin Orgaran Warfarin Sodium  Buff-a-Comp Enoxaparin Orudis Zorpin  Buff-a-Comp with Codeine Equegesic Os-Cal-Gesic   Buffaprin Excedrin plain, buffered or Extra Strength Oxalid   Bufferin Arthritis Strength Feldene Oxphenbutazone   Bufferin plain or Extra Strength Feldene Capsules Oxycodone with Aspirin   Bufferin with Codeine Fenoprofen Fenoprofen Pabalate or Pabalate-SF   Buffets II Flogesic Panagesic   Buffinol plain or Extra Strength Florinal or Florinal with Codeine Panwarfarin   Buf-Tabs Flurbiprofen Penicillamine   Butalbital Compound Four-way cold tablets Penicillin   Butazolidin Fragmin Pepto-Bismol   Carbenicillin Geminisyn Percodan   Carna Arthritis Reliever Geopen Persantine   Carprofen Gold's salt Persistin   Chloramphenicol Goody's Phenylbutazone   Chloromycetin  Haltrain Piroxlcam   Clmetidine heparin Plaquenil   Cllnoril Hyco-pap Ponstel   Clofibrate Hydroxy chloroquine Propoxyphen         Before stopping any of these medications, be sure to consult the physician who ordered them.  Some, such as Coumadin (Warfarin) are ordered to prevent or treat serious conditions such as "deep thrombosis", "pumonary embolisms", and other heart problems.  The amount of time that you may need off of the medication may also vary with the medication and the reason for which you were taking it.  If you are taking any of these medications, please make sure you notify your pain physician before you undergo any procedures.         GENERAL RISKS AND COMPLICATIONS  What are the risk, side effects and possible complications? Generally speaking, most procedures are safe.  However, with any procedure there  are risks, side effects, and the possibility of complications.  The risks and complications are dependent upon the sites that are lesioned, or the type of nerve block to be performed.  The closer the procedure is to the spine, the more serious the risks are.  Great care is taken when placing the radio frequency needles, block needles or lesioning probes, but sometimes complications can occur. 2. Infection: Any time there is an injection through the skin, there is a risk of infection.  This is why sterile conditions are used for these blocks.  There are four possible types of infection. 1. Localized skin infection. 2. Central Nervous System Infection-This can be in the form of Meningitis, which can be deadly. 3. Epidural Infections-This can be in the form of an epidural abscess, which can cause pressure inside of the spine, causing compression of the spinal cord with subsequent paralysis. This would require an emergency surgery to decompress, and there are no guarantees that the patient would recover from the paralysis. 4. Discitis-This is an infection of the intervertebral  discs.  It occurs in about 1% of discography procedures.  It is difficult to treat and it may lead to surgery.        2. Pain: the needles have to go through skin and soft tissues, will cause soreness.       3. Damage to internal structures:  The nerves to be lesioned may be near blood vessels or    other nerves which can be potentially damaged.       4. Bleeding: Bleeding is more common if the patient is taking blood thinners such as  aspirin, Coumadin, Ticiid, Plavix, etc., or if he/she have some genetic predisposition  such as hemophilia. Bleeding into the spinal canal can cause compression of the spinal  cord with subsequent paralysis.  This would require an emergency surgery to  decompress and there are no guarantees that the patient would recover from the  paralysis.       5. Pneumothorax:  Puncturing of a lung is a possibility, every time a needle is introduced in  the area of the chest or upper back.  Pneumothorax refers to free air around the  collapsed lung(s), inside of the thoracic cavity (chest cavity).  Another two possible  complications related to a similar event would include: Hemothorax and Chylothorax.   These are variations of the Pneumothorax, where instead of air around the collapsed  lung(s), you may have blood or chyle, respectively.       6. Spinal headaches: They may occur with any procedures in the area of the spine.       7. Persistent CSF (Cerebro-Spinal Fluid) leakage: This is a rare problem, but may occur  with prolonged intrathecal or epidural catheters either due to the formation of a fistulous  track or a dural tear.       8. Nerve damage: By working so close to the spinal cord, there is always a possibility of  nerve damage, which could be as serious as a permanent spinal cord injury with  paralysis.       9. Death:  Although rare, severe deadly allergic reactions known as "Anaphylactic  reaction" can occur to any of the medications used.      10. Worsening of the  symptoms:  We can always make thing worse.  What are the chances of something like this happening? Chances of any of this occuring are extremely low.  By statistics, you have more  of a chance of getting killed in a motor vehicle accident: while driving to the hospital than any of the above occurring .  Nevertheless, you should be aware that they are possibilities.  In general, it is similar to taking a shower.  Everybody knows that you can slip, hit your head and get killed.  Does that mean that you should not shower again?  Nevertheless always keep in mind that statistics do not mean anything if you happen to be on the wrong side of them.  Even if a procedure has a 1 (one) in a 1,000,000 (million) chance of going wrong, it you happen to be that one..Also, keep in mind that by statistics, you have more of a chance of having something go wrong when taking medications.  Who should not have this procedure? If you are on a blood thinning medication (e.g. Coumadin, Plavix, see list of "Blood Thinners"), or if you have an active infection going on, you should not have the procedure.  If you are taking any blood thinners, please inform your physician.  How should I prepare for this procedure?  Do not eat or drink anything at least six hours prior to the procedure.  Bring a driver with you .  It cannot be a taxi.  Come accompanied by an adult that can drive you back, and that is strong enough to help you if your legs get weak or numb from the local anesthetic.  Take all of your medicines the morning of the procedure with just enough water to swallow them.  If you have diabetes, make sure that you are scheduled to have your procedure done first thing in the morning, whenever possible.  If you have diabetes, take only half of your insulin dose and notify our nurse that you have done so as soon as you arrive at the clinic.  If you are diabetic, but only take blood sugar pills (oral hypoglycemic), then do  not take them on the morning of your procedure.  You may take them after you have had the procedure.  Do not take aspirin or any aspirin-containing medications, at least eleven (11) days prior to the procedure.  They may prolong bleeding.  Wear loose fitting clothing that may be easy to take off and that you would not mind if it got stained with Betadine or blood.  Do not wear any jewelry or perfume  Remove any nail coloring.  It will interfere with some of our monitoring equipment.  NOTE: Remember that this is not meant to be interpreted as a complete list of all possible complications.  Unforeseen problems may occur.  BLOOD THINNERS The following drugs contain aspirin or other products, which can cause increased bleeding during surgery and should not be taken for 2 weeks prior to and 1 week after surgery.  If you should need take something for relief of minor pain, you may take acetaminophen which is found in Tylenol,m Datril, Anacin-3 and Panadol. It is not blood thinner. The products listed below are.  Do not take any of the products listed below in addition to any listed on your instruction sheet.  A.P.C or A.P.C with Codeine Codeine Phosphate Capsules #3 Ibuprofen Ridaura  ABC compound Congesprin Imuran rimadil  Advil Cope Indocin Robaxisal  Alka-Seltzer Effervescent Pain Reliever and Antacid Coricidin or Coricidin-D  Indomethacin Rufen  Alka-Seltzer plus Cold Medicine Cosprin Ketoprofen S-A-C Tablets  Anacin Analgesic Tablets or Capsules Coumadin Korlgesic Salflex  Anacin Extra Strength Analgesic tablets or capsules  CP-2 Tablets Lanoril Salicylate  Anaprox Cuprimine Capsules Levenox Salocol  Anexsia-D Dalteparin Magan Salsalate  Anodynos Darvon compound Magnesium Salicylate Sine-off  Ansaid Dasin Capsules Magsal Sodium Salicylate  Anturane Depen Capsules Marnal Soma  APF Arthritis pain formula Dewitt's Pills Measurin Stanback  Argesic Dia-Gesic Meclofenamic Sulfinpyrazone   Arthritis Bayer Timed Release Aspirin Diclofenac Meclomen Sulindac  Arthritis pain formula Anacin Dicumarol Medipren Supac  Analgesic (Safety coated) Arthralgen Diffunasal Mefanamic Suprofen  Arthritis Strength Bufferin Dihydrocodeine Mepro Compound Suprol  Arthropan liquid Dopirydamole Methcarbomol with Aspirin Synalgos  ASA tablets/Enseals Disalcid Micrainin Tagament  Ascriptin Doan's Midol Talwin  Ascriptin A/D Dolene Mobidin Tanderil  Ascriptin Extra Strength Dolobid Moblgesic Ticlid  Ascriptin with Codeine Doloprin or Doloprin with Codeine Momentum Tolectin  Asperbuf Duoprin Mono-gesic Trendar  Aspergum Duradyne Motrin or Motrin IB Triminicin  Aspirin plain, buffered or enteric coated Durasal Myochrisine Trigesic  Aspirin Suppositories Easprin Nalfon Trillsate  Aspirin with Codeine Ecotrin Regular or Extra Strength Naprosyn Uracel  Atromid-S Efficin Naproxen Ursinus  Auranofin Capsules Elmiron Neocylate Vanquish  Axotal Emagrin Norgesic Verin  Azathioprine Empirin or Empirin with Codeine Normiflo Vitamin E  Azolid Emprazil Nuprin Voltaren  Bayer Aspirin plain, buffered or children's or timed BC Tablets or powders Encaprin Orgaran Warfarin Sodium  Buff-a-Comp Enoxaparin Orudis Zorpin  Buff-a-Comp with Codeine Equegesic Os-Cal-Gesic   Buffaprin Excedrin plain, buffered or Extra Strength Oxalid   Bufferin Arthritis Strength Feldene Oxphenbutazone   Bufferin plain or Extra Strength Feldene Capsules Oxycodone with Aspirin   Bufferin with Codeine Fenoprofen Fenoprofen Pabalate or Pabalate-SF   Buffets II Flogesic Panagesic   Buffinol plain or Extra Strength Florinal or Florinal with Codeine Panwarfarin   Buf-Tabs Flurbiprofen Penicillamine   Butalbital Compound Four-way cold tablets Penicillin   Butazolidin Fragmin Pepto-Bismol   Carbenicillin Geminisyn Percodan   Carna Arthritis Reliever Geopen Persantine   Carprofen Gold's salt Persistin   Chloramphenicol Goody's Phenylbutazone    Chloromycetin Haltrain Piroxlcam   Clmetidine heparin Plaquenil   Cllnoril Hyco-pap Ponstel   Clofibrate Hydroxy chloroquine Propoxyphen         Before stopping any of these medications, be sure to consult the physician who ordered them.  Some, such as Coumadin (Warfarin) are ordered to prevent or treat serious conditions such as "deep thrombosis", "pumonary embolisms", and other heart problems.  The amount of time that you may need off of the medication may also vary with the medication and the reason for which you were taking it.  If you are taking any of these medications, please make sure you notify your pain physician before you undergo any procedures.

## 2016-03-18 ENCOUNTER — Other Ambulatory Visit: Payer: Self-pay | Admitting: Unknown Physician Specialty

## 2016-03-19 ENCOUNTER — Telehealth: Payer: Self-pay | Admitting: *Deleted

## 2016-03-30 ENCOUNTER — Encounter: Payer: Self-pay | Admitting: Pain Medicine

## 2016-03-30 ENCOUNTER — Ambulatory Visit: Payer: BLUE CROSS/BLUE SHIELD | Attending: Pain Medicine | Admitting: Pain Medicine

## 2016-03-30 VITALS — BP 130/67 | HR 87 | Temp 98.0°F | Resp 16 | Ht 63.0 in | Wt 269.0 lb

## 2016-03-30 DIAGNOSIS — M5136 Other intervertebral disc degeneration, lumbar region: Secondary | ICD-10-CM | POA: Insufficient documentation

## 2016-03-30 DIAGNOSIS — Z981 Arthrodesis status: Secondary | ICD-10-CM

## 2016-03-30 DIAGNOSIS — M5416 Radiculopathy, lumbar region: Secondary | ICD-10-CM

## 2016-03-30 DIAGNOSIS — M1288 Other specific arthropathies, not elsewhere classified, other specified site: Secondary | ICD-10-CM | POA: Diagnosis not present

## 2016-03-30 DIAGNOSIS — M533 Sacrococcygeal disorders, not elsewhere classified: Secondary | ICD-10-CM

## 2016-03-30 DIAGNOSIS — M545 Low back pain: Secondary | ICD-10-CM | POA: Diagnosis present

## 2016-03-30 DIAGNOSIS — M48062 Spinal stenosis, lumbar region with neurogenic claudication: Secondary | ICD-10-CM

## 2016-03-30 DIAGNOSIS — M79606 Pain in leg, unspecified: Secondary | ICD-10-CM | POA: Diagnosis present

## 2016-03-30 DIAGNOSIS — M47816 Spondylosis without myelopathy or radiculopathy, lumbar region: Secondary | ICD-10-CM

## 2016-03-30 DIAGNOSIS — M170 Bilateral primary osteoarthritis of knee: Secondary | ICD-10-CM

## 2016-03-30 MED ORDER — BUPIVACAINE HCL (PF) 0.25 % IJ SOLN
INTRAMUSCULAR | Status: AC
  Administered 2016-03-30: 13:00:00
  Filled 2016-03-30: qty 30

## 2016-03-30 MED ORDER — LIDOCAINE HCL (PF) 1 % IJ SOLN
INTRAMUSCULAR | Status: AC
  Administered 2016-03-30: 13:00:00
  Filled 2016-03-30: qty 10

## 2016-03-30 MED ORDER — SODIUM CHLORIDE 0.9% FLUSH: 20.0000 mL | Freq: Once | INTRAVENOUS | Status: DC

## 2016-03-30 MED ORDER — LACTATED RINGERS IV SOLN
1000.0000 mL | INTRAVENOUS | Status: DC
Start: 2016-03-30 — End: 2018-06-28

## 2016-03-30 MED ORDER — ORPHENADRINE CITRATE 30 MG/ML IJ SOLN
INTRAMUSCULAR | Status: AC
  Administered 2016-03-30: 13:00:00
  Filled 2016-03-30: qty 2

## 2016-03-30 MED ORDER — MIDAZOLAM HCL 5 MG/5ML IJ SOLN
INTRAMUSCULAR | Status: AC
  Administered 2016-03-30: 4 mg via INTRAVENOUS
  Filled 2016-03-30: qty 5

## 2016-03-30 MED ORDER — TRIAMCINOLONE ACETONIDE 40 MG/ML IJ SUSP
INTRAMUSCULAR | Status: AC
  Administered 2016-03-30: 13:00:00
  Filled 2016-03-30: qty 1

## 2016-03-30 MED ORDER — FENTANYL CITRATE (PF) 100 MCG/2ML IJ SOLN: 100.0000 ug | Freq: Once | INTRAMUSCULAR | Status: DC

## 2016-03-30 MED ORDER — ORPHENADRINE CITRATE 30 MG/ML IJ SOLN: 60.0000 mg | Freq: Once | INTRAMUSCULAR | Status: DC

## 2016-03-30 MED ORDER — BUPIVACAINE HCL (PF) 0.25 % IJ SOLN: 30.0000 mL | Freq: Once | INTRAMUSCULAR | Status: DC

## 2016-03-30 MED ORDER — CEFUROXIME AXETIL 250 MG PO TABS: 250.0000 mg | ORAL_TABLET | Freq: Two times a day (BID) | ORAL | Status: DC

## 2016-03-30 MED ORDER — MIDAZOLAM HCL 5 MG/5ML IJ SOLN: 5.0000 mg | Freq: Once | INTRAMUSCULAR | Status: DC

## 2016-03-30 MED ORDER — FENTANYL CITRATE (PF) 100 MCG/2ML IJ SOLN
INTRAMUSCULAR | Status: AC
  Administered 2016-03-30: 100 ug via INTRAVENOUS
  Filled 2016-03-30: qty 2

## 2016-03-30 MED ORDER — CEFAZOLIN SODIUM 1-5 GM-% IV SOLN: 1.0000 g | Freq: Once | INTRAVENOUS | Status: DC

## 2016-03-30 MED ORDER — LIDOCAINE HCL (PF) 1 % IJ SOLN: 10.0000 mL | Freq: Once | INTRAMUSCULAR | Status: DC

## 2016-03-30 MED ORDER — CEFAZOLIN SODIUM 1 G IJ SOLR
INTRAMUSCULAR | Status: AC
  Administered 2016-03-30: 12:00:00
  Filled 2016-03-30: qty 10

## 2016-03-30 MED ORDER — TRIAMCINOLONE ACETONIDE 40 MG/ML IJ SUSP: 40.0000 mg | Freq: Once | INTRAMUSCULAR | Status: DC

## 2016-03-30 MED ORDER — SODIUM CHLORIDE 0.9 % IJ SOLN
INTRAMUSCULAR | Status: AC
  Administered 2016-03-30: 13:00:00
  Filled 2016-03-30: qty 20

## 2016-03-30 NOTE — Progress Notes (Signed)
Safety precautions to be maintained throughout the outpatient stay will include: orient to surroundings, keep bed in low position, maintain call bell within reach at all times, provide assistance with transfer out of bed and ambulation.  

## 2016-03-30 NOTE — Patient Instructions (Addendum)
PLAN   Continue present medications Soma and hydrocodone acetaminophen. Please get Ceftin antibiotic today and begin taking Ceftin antibiotic as prescribed  F/U PCP [name] for evaliation of  BP and general medical  condition. Please follow-up with [name] as discussed  F/U surgical evaluation. Neurosurgical evaluation as discussed  TENS unit as discussed  F/U neurological evaluation. May consider PNCV/EMG studies and other studies pending follow-up evaluations  May consider radiofrequency rhizolysis or intraspinal procedures pending response to present treatment and F/U evaluation   Patient to call [facility] Management Center should patient have concerns prior to scheduled return appointment.  Pain Management Discharge Instructions  General Discharge Instructions :  If you need to reach your doctor call: Monday-Friday 8:00 am - 4:00 pm at [phone] or toll free [phone].  After clinic hours [phone] to have operator reach doctor.  Bring all of your medication bottles to all your appointments in the pain clinic.  To cancel or reschedule your appointment with Pain Management please remember to call 24 hours in advance to avoid a fee.  Refer to the educational materials which you have been given on: General Risks, I had my Procedure. Discharge Instructions, Post Sedation.  Post Procedure Instructions:  The drugs you were given will stay in your system until tomorrow, so for the next 24 hours you should not drive, make any legal decisions or drink any alcoholic beverages.  You may eat anything you prefer, but it is better to start with liquids then soups and crackers, and gradually work up to solid foods.  Please notify your doctor immediately if you have any unusual bleeding, trouble breathing or pain that is not related to your normal pain.  Depending on the type of procedure that was done, some parts of your body may feel week and/or numb.  This usually clears up by  tonight or the next day.  Walk with the use of an assistive device or accompanied by an adult for the 24 hours.  You may use ice on the affected area for the first 24 hours.  Put ice in a Ziploc bag and cover with a towel and place against area 15 minutes on 15 minutes off.  You may switch to heat after 24 hours.  A prescription for ceftin was sent to your pharmacy and should be available for pickup today.

## 2016-03-30 NOTE — Progress Notes (Signed)
   Subjective:    Patient ID: [name], female    DOB: [birth year], 60 y.o.   MRN: [id number]  HPI  PROCEDURE PERFORMED: Lumbar epidural steroid injection   NOTE: The patient is a 60 y.o. female who returns to [place] for further evaluation and treatment of pain involving the lumbar and lower extremity region. 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 Marland Kitchen There is concern regarding intraspinal abnormalities treated to patient's symptomatology with concern regarding lumbar stenosis with neurogenic claudication as well as lumbar radiculopathy The risks, benefits, and expectations of the procedure have been discussed and explained to the patient who was understanding and in agreement with suggested treatment plan. We will proceed with lumbar epidural steroid injection as discussed and as explained to the patient who is willing to proceed with procedure as planned.   DESCRIPTION OF PROCEDURE: Lumbar epidural steroid injection with IV Versed, IV fentanyl conscious sedation, EKG, blood pressure, pulse, capnography, and pulse oximetry monitoring. The procedure was performed with the patient in the prone position under fluoroscopic guidance. A local anesthetic skin wheal of 1.5% plain lidocaine was accomplished at proposed entry site. An 18-gauge Tuohy epidural needle was inserted at the L 4 vertebral body level right of the midline via loss-of-resistance technique with negative heme and negative CSF return. A total of 4 mL of Preservative-Free normal saline with 40 mg of Kenalog injected incrementally via epidurally placed needle. Needle was removed.  Myoneural block injections of the gluteal musculature region Following Betadine prep of proposed entry site a 22-gauge needle was inserted into the gluteal  musculature region and following negative aspiration 2 cc of 0.25% bupivacaine with Norflex was injected for myoneural block injection of the gluteal musculature region 4  A total of 40 mg of Kenalog was utilized for the procedure.   The patient tolerated the injection well.    PLAN:   1. Medications: We will continue presently prescribed medications . Soma and hydrocodone acetaminophen 2. Will consider modification of treatment regimen pending response to treatment rendered on today's visit and follow-up evaluation. 3. The patient is to follow-up with primary care physician [name] regarding blood pressure and general medical condition status post lumbar epidural steroid injection performed on today's visit. 4. Surgical evaluation. Has been addressed 5. Neurological evaluation. Has been addressed May consider [facility] EMG studies 6. The patient may be a candidate for radiofrequency procedures, implantation device, and other treatment pending response to treatment and follow-up evaluation. 7. The patient has been advised to adhere to proper body mechanics and avoid activities which appear to aggravate condition. 8. The patient has been advised to call the [facility] prior to scheduled return appointment should there be significant change in condition or should there be sign  The patient is understanding and agrees with the suggested  treatment plan   Review of Systems     Objective:   Physical Exam        Assessment & Plan:

## 2016-03-31 ENCOUNTER — Telehealth: Payer: Self-pay

## 2016-03-31 NOTE — Telephone Encounter (Signed)
Post procedure phone call.   States she is doing great.  

## 2016-04-14 ENCOUNTER — Encounter: Payer: BLUE CROSS/BLUE SHIELD | Admitting: Pain Medicine

## 2016-04-15 ENCOUNTER — Other Ambulatory Visit: Payer: Self-pay | Admitting: Unknown Physician Specialty

## 2016-04-15 ENCOUNTER — Ambulatory Visit: Payer: BLUE CROSS/BLUE SHIELD | Admitting: Unknown Physician Specialty

## 2016-04-16 ENCOUNTER — Telehealth: Payer: Self-pay | Admitting: *Deleted

## 2016-04-16 NOTE — Telephone Encounter (Signed)
Nurses Please inform patient that she needs to try to adjust her medication dose to last until her appointment Please follow-up with me regarding disposition as needed

## 2016-04-16 NOTE — Telephone Encounter (Signed)
Pt called wondering if Dr. Primus Bravo could call her in a prescription and she will send her son to pickup the hard copy. She stated that she is out of meds and need enough pills that will last her until 04/21/16 in which is the day of her appt. Please give the pt a call. thanks

## 2016-04-17 NOTE — Telephone Encounter (Signed)
Attempted to call patient, message left advising patient of Dr. Ethel Rana advise.

## 2016-04-22 ENCOUNTER — Ambulatory Visit: Payer: BLUE CROSS/BLUE SHIELD | Attending: Pain Medicine | Admitting: Pain Medicine

## 2016-04-22 ENCOUNTER — Encounter: Payer: Self-pay | Admitting: Pain Medicine

## 2016-04-22 VITALS — BP 121/57 | HR 80 | Temp 98.3°F | Resp 16 | Ht 63.0 in | Wt 269.0 lb

## 2016-04-22 DIAGNOSIS — Z981 Arthrodesis status: Secondary | ICD-10-CM

## 2016-04-22 DIAGNOSIS — M545 Low back pain: Secondary | ICD-10-CM | POA: Diagnosis present

## 2016-04-22 DIAGNOSIS — M25561 Pain in right knee: Secondary | ICD-10-CM | POA: Diagnosis present

## 2016-04-22 DIAGNOSIS — M48062 Spinal stenosis, lumbar region with neurogenic claudication: Secondary | ICD-10-CM

## 2016-04-22 DIAGNOSIS — M5116 Intervertebral disc disorders with radiculopathy, lumbar region: Secondary | ICD-10-CM | POA: Insufficient documentation

## 2016-04-22 DIAGNOSIS — M25551 Pain in right hip: Secondary | ICD-10-CM | POA: Diagnosis present

## 2016-04-22 DIAGNOSIS — M7060 Trochanteric bursitis, unspecified hip: Secondary | ICD-10-CM | POA: Insufficient documentation

## 2016-04-22 DIAGNOSIS — M533 Sacrococcygeal disorders, not elsewhere classified: Secondary | ICD-10-CM | POA: Insufficient documentation

## 2016-04-22 DIAGNOSIS — M170 Bilateral primary osteoarthritis of knee: Secondary | ICD-10-CM | POA: Insufficient documentation

## 2016-04-22 DIAGNOSIS — M1288 Other specific arthropathies, not elsewhere classified, other specified site: Secondary | ICD-10-CM | POA: Insufficient documentation

## 2016-04-22 DIAGNOSIS — M4806 Spinal stenosis, lumbar region: Secondary | ICD-10-CM | POA: Diagnosis not present

## 2016-04-22 DIAGNOSIS — M5416 Radiculopathy, lumbar region: Secondary | ICD-10-CM

## 2016-04-22 DIAGNOSIS — M47816 Spondylosis without myelopathy or radiculopathy, lumbar region: Secondary | ICD-10-CM

## 2016-04-22 MED ORDER — CARISOPRODOL 350 MG PO TABS: ORAL_TABLET | ORAL | Status: DC

## 2016-04-22 MED ORDER — HYDROCODONE-ACETAMINOPHEN 10-325 MG PO TABS: ORAL_TABLET | ORAL | Status: DC

## 2016-04-22 NOTE — Progress Notes (Signed)
Subjective:    Patient ID: [name], female    DOB: 11-21-1956, 60 y.o.   MRN: [id number]  HPI  The patient is a 60 year old female who returns to pain management for further evaluation and treatment of pain involving the lower back lower extremity region with pain occurring in the region of the right hip and knee especially. The patient states that the pain is aggravated by standing walking and becomes more intense as the day progresses. The patient denies any trauma change in events of daily living the call significant change in symptomatology. The patient states that she has been unable sleep on her. Due to the pain occurring in the region of the hip and region of the knee of the right lower extremity. We discussed patient's condition and patient will continue her medications consisting of Soma and hydrocodone acetaminophen and we will proceed with geniculate nerve blocks of the knee at time return appointment with myoneural block injection to be performed in the region of the hip. The patient was with understanding and agreement suggested treatment plan. The patient will also follow up with primary care physician  [name] for evaluation of general medical condition as discussed and as well. All agreed to suggested treatment plan.     Review of Systems     Objective:   Physical Exam   There was tenderness to palpation of the splenius capitis and occipitalis musculature regions palpation which reproduces mild discomfort with mild tenderness over the cervical facet cervical paraspinal musculature region. Palpation over the region of the acromioclavicular and glenohumeral joint regions reproduced pain of moderate degree. The patient was with unremarkable Spurling's maneuver. Palpation over the thoracic region thoracic facet region was attends to palpation of moderate degree with no crepitus of the thoracic region noted. Palpation over the region of the lumbar region lumbar facet region  was with moderate discomfort with lateral bending rotation extension and palpation of the lumbar facets reproducing moderate discomfort right greater than left. There was moderate to moderately severe tenderness of the PSIS and PII S region as well as the greater trochanteric region iliotibial band region of the right lower extremity especially. The knee was attends to palpation with negative anterior and posterior drawer signs. There was significant increased pain with range of motion maneuvers of the knee. There was no ballottement of the patella and there was no increased warmth erythema of the knee noted. EHL strength appeared to be slightly decreased. There was negative clonus negative Homans. No sensory deficit or dermatomal distribution detected. Abdomen nontender with no costovertebral tenderness noted     Assessment & Plan:   Degenerative joint disease of knees  Degenerative disc disease lumbar spine Most predominant at L4-5. Multilevel facet arthropathy at L2-3, L3-4, L4-5, with disc space narrowing as well Disc desiccation at T12-L1 to L4-L5  Lumbar facet syndrome  Lumbar radiculopathy  Lumbar stenosis  Greater trochanteric bursitis  Sacroiliac joint dysfunction      PLAN   Continue present medications Soma and hydrocodone acetaminophen.    Geniculate nerve blocks of knee to be performed at time return appointment  F/U PCP [name] for evaliation of  BP and general medical  condition. . Please see PCP  C  [name] this week as discussed   F/U surgical evaluation as discussed  F/U neurological evaluation. May consider pending follow-up evaluations  May consider radiofrequency rhizolysis or intraspinal procedures pending response to present treatment and F/U evaluation   Patient to call [facility] Center  should patient have concerns prior to scheduled return appointment

## 2016-04-22 NOTE — Patient Instructions (Addendum)
PLAN   Continue present medications Soma and hydrocodone acetaminophen.    Geniculate nerve blocks of knee to be performed at time return appointment  F/U PCP [name] for evaliation of  BP and general medical  condition. . Please see PCP  C  [name] this week as discussed   F/U surgical evaluation as discussed  F/U neurological evaluation. May consider pending follow-up evaluations  May consider radiofrequency rhizolysis or intraspinal procedures pending response to present treatment and F/U evaluation   Patient to call [facility] should patient have concerns prior to scheduled return appointmentKnee Injection A knee injection is a procedure to get medicine into your knee joint. Your health care provider puts a needle into the joint and injects medicine with an attached syringe. The injected medicine may relieve the pain, swelling, and stiffness of arthritis. The injected medicine may also help to lubricate and cushion your knee joint. You may need more than one injection. LET [facility] CARE PROVIDER KNOW ABOUT:  Any allergies you have.  All medicines you are taking, including vitamins, herbs, eye drops, creams, and over-the-counter medicines.  Previous problems you or members of your family have had with the use of anesthetics.  Any blood disorders you have.  Previous surgeries you have had.  Any medical conditions you may have. RISKS AND COMPLICATIONS Generally, this is a safe procedure. However, problems may occur, including:  Infection.  Bleeding.  Worsening symptoms.  Damage to the area around your knee.  Allergic reaction to any of the medicines.  Skin reactions from repeated injections. BEFORE THE PROCEDURE  Ask your health care provider about changing or stopping your regular medicines. This is especially important if you are taking diabetes medicines or blood thinners.  Plan to have someone take you home after the procedure. PROCEDURE  You will  sit or lie down in a position for your knee to be treated.  The skin over your kneecap will be cleaned with a germ-killing solution (antiseptic).  You will be given a medicine that numbs the area (local anesthetic). You may feel some stinging.  After your knee becomes numb, you will have a second injection. This is the medicine. This needle is carefully placed between your kneecap and your knee. The medicine is injected into the joint space.  At the end of the procedure, the needle will be removed.  A bandage (dressing) may be placed over the injection site. The procedure may vary among health care providers and hospitals. AFTER THE PROCEDURE  You may have to move your knee through its full range of motion. This helps to get all of the medicine into your joint space.  Your blood pressure, heart rate, breathing rate, and blood oxygen level will be monitored often until the medicines you were given have worn off.  You will be watched to make sure that you do not have a reaction to the injected medicine.   This information is not intended to replace advice given to you by your health care provider. Make sure you discuss any questions you have with your health care provider.   Document Released: 02/07/2007 Document Revised: 12/07/2014 Document Reviewed: 09/26/2014 Elsevier Interactive Patient Education 2016 [place]  What are the risk, side effects and possible complications? Generally speaking, most procedures are safe.  However, with any procedure there are risks, side effects, and the possibility of complications.  The risks and complications are dependent upon the sites that are lesioned, or the type of  nerve block to be performed.  The closer the procedure is to the spine, the more serious the risks are.  Great care is taken when placing the radio frequency needles, block needles or lesioning probes, but sometimes complications can  occur.  Infection: Any time there is an injection through the skin, there is a risk of infection.  This is why sterile conditions are used for these blocks.  There are four possible types of infection.  Localized skin infection.  Central Nervous System Infection-This can be in the form of Meningitis, which can be deadly.  Epidural Infections-This can be in the form of an epidural abscess, which can cause pressure inside of the spine, causing compression of the spinal cord with subsequent paralysis. This would require an emergency surgery to decompress, and there are no guarantees that the patient would recover from the paralysis.  Discitis-This is an infection of the intervertebral discs.  It occurs in about 1% of discography procedures.  It is difficult to treat and it may lead to surgery.        2. Pain: the needles have to go through skin and soft tissues, will cause soreness.       3. Damage to internal structures:  The nerves to be lesioned may be near blood vessels or    other nerves which can be potentially damaged.       4. Bleeding: Bleeding is more common if the patient is taking blood thinners such as  aspirin, Coumadin, Ticiid, Plavix, etc., or if he/she have some genetic predisposition  such as hemophilia. Bleeding into the spinal canal can cause compression of the spinal  cord with subsequent paralysis.  This would require an emergency surgery to  decompress and there are no guarantees that the patient would recover from the  paralysis.       5. Pneumothorax:  Puncturing of a lung is a possibility, every time a needle is introduced in  the area of the chest or upper back.  Pneumothorax refers to free air around the  collapsed lung(s), inside of the thoracic cavity (chest cavity).  Another two possible  complications related to a similar event would include: Hemothorax and Chylothorax.   These are variations of the Pneumothorax, where instead of air around the collapsed  lung(s), you may  have blood or chyle, respectively.       6. Spinal headaches: They may occur with any procedures in the area of the spine.       7. Persistent CSF (Cerebro-Spinal Fluid) leakage: This is a rare problem, but may occur  with prolonged intrathecal or epidural catheters either due to the formation of a fistulous  track or a dural tear.       8. Nerve damage: By working so close to the spinal cord, there is always a possibility of  nerve damage, which could be as serious as a permanent spinal cord injury with  paralysis.       9. Death:  Although rare, severe deadly allergic reactions known as "Anaphylactic  reaction" can occur to any of the medications used.      10. Worsening of the symptoms:  We can always make thing worse.  What are the chances of something like this happening? Chances of any of this occuring are extremely low.  By statistics, you have more of a chance of getting killed in a motor vehicle accident: while driving to the hospital than any of the above occurring .  Nevertheless, you  should be aware that they are possibilities.  In general, it is similar to taking a shower.  Everybody knows that you can slip, hit your head and get killed.  Does that mean that you should not shower again?  Nevertheless always keep in mind that statistics do not mean anything if you happen to be on the wrong side of them.  Even if a procedure has a 1 (one) in a 1,000,000 (million) chance of going wrong, it you happen to be that one..Also, keep in mind that by statistics, you have more of a chance of having something go wrong when taking medications.  Who should not have this procedure? If you are on a blood thinning medication (e.g. Coumadin, Plavix, see list of "Blood Thinners"), or if you have an active infection going on, you should not have the procedure.  If you are taking any blood thinners, please inform your physician.  How should I prepare for this procedure?  Do not eat or drink anything at least  six hours prior to the procedure.  Bring a driver with you .  It cannot be a taxi.  Come accompanied by an adult that can drive you back, and that is strong enough to help you if your legs get weak or numb from the local anesthetic.  Take all of your medicines the morning of the procedure with just enough water to swallow them.  If you have diabetes, make sure that you are scheduled to have your procedure done first thing in the morning, whenever possible.  If you have diabetes, take only half of your insulin dose and notify our nurse that you have done so as soon as you arrive at the clinic.  If you are diabetic, but only take blood sugar pills (oral hypoglycemic), then do not take them on the morning of your procedure.  You may take them after you have had the procedure.  Do not take aspirin or any aspirin-containing medications, at least eleven (11) days prior to the procedure.  They may prolong bleeding.  Wear loose fitting clothing that may be easy to take off and that you would not mind if it got stained with Betadine or blood.  Do not wear any jewelry or perfume  Remove any nail coloring.  It will interfere with some of our monitoring equipment.  NOTE: Remember that this is not meant to be interpreted as a complete list of all possible complications.  Unforeseen problems may occur.  BLOOD THINNERS The following drugs contain aspirin or other products, which can cause increased bleeding during surgery and should not be taken for 2 weeks prior to and 1 week after surgery.  If you should need take something for relief of minor pain, you may take acetaminophen which is found in Tylenol,m Datril, Anacin-3 and Panadol. It is not blood thinner. The products listed below are.  Do not take any of the products listed below in addition to any listed on your instruction sheet.  A.P.C or A.P.C with Codeine Codeine Phosphate Capsules #3 Ibuprofen Ridaura  ABC compound Congesprin Imuran rimadil   Advil Cope Indocin Robaxisal  Alka-Seltzer Effervescent Pain Reliever and Antacid Coricidin or Coricidin-D  Indomethacin Rufen  Alka-Seltzer plus Cold Medicine Cosprin Ketoprofen S-A-C Tablets  Anacin Analgesic Tablets or Capsules Coumadin Korlgesic Salflex  Anacin Extra Strength Analgesic tablets or capsules CP-2 Tablets Lanoril Salicylate  Anaprox Cuprimine Capsules Levenox Salocol  Anexsia-D Dalteparin Magan Salsalate  Anodynos Darvon compound Magnesium Salicylate Sine-off  Ansaid Dasin  Capsules Magsal Sodium Salicylate  Anturane Depen Capsules Marnal Soma  APF Arthritis pain formula Dewitt's Pills Measurin Stanback  Argesic Dia-Gesic Meclofenamic Sulfinpyrazone  Arthritis Bayer Timed Release Aspirin Diclofenac Meclomen Sulindac  Arthritis pain formula Anacin Dicumarol Medipren Supac  Analgesic (Safety coated) Arthralgen Diffunasal Mefanamic Suprofen  Arthritis Strength Bufferin Dihydrocodeine Mepro Compound Suprol  Arthropan liquid Dopirydamole Methcarbomol with Aspirin Synalgos  ASA tablets/Enseals Disalcid Micrainin Tagament  Ascriptin Doan's Midol Talwin  Ascriptin A/D Dolene Mobidin Tanderil  Ascriptin Extra Strength Dolobid Moblgesic Ticlid  Ascriptin with Codeine Doloprin or Doloprin with Codeine Momentum Tolectin  Asperbuf Duoprin Mono-gesic Trendar  Aspergum Duradyne Motrin or Motrin IB Triminicin  Aspirin plain, buffered or enteric coated Durasal Myochrisine Trigesic  Aspirin Suppositories Easprin Nalfon Trillsate  Aspirin with Codeine Ecotrin Regular or Extra Strength Naprosyn Uracel  Atromid-S Efficin Naproxen Ursinus  Auranofin Capsules Elmiron Neocylate Vanquish  Axotal Emagrin Norgesic Verin  Azathioprine Empirin or Empirin with Codeine Normiflo Vitamin E  Azolid Emprazil Nuprin Voltaren  Bayer Aspirin plain, buffered or children's or timed BC Tablets or powders Encaprin Orgaran Warfarin Sodium  Buff-a-Comp Enoxaparin Orudis Zorpin  Buff-a-Comp with Codeine  Equegesic Os-Cal-Gesic   Buffaprin Excedrin plain, buffered or Extra Strength Oxalid   Bufferin Arthritis Strength Feldene Oxphenbutazone   Bufferin plain or Extra Strength Feldene Capsules Oxycodone with Aspirin   Bufferin with Codeine Fenoprofen Fenoprofen Pabalate or Pabalate-SF   Buffets II Flogesic Panagesic   Buffinol plain or Extra Strength Florinal or Florinal with Codeine Panwarfarin   Buf-Tabs Flurbiprofen Penicillamine   Butalbital Compound Four-way cold tablets Penicillin   Butazolidin Fragmin Pepto-Bismol   Carbenicillin Geminisyn Percodan   Carna Arthritis Reliever Geopen Persantine   Carprofen Gold's salt Persistin   Chloramphenicol Goody's Phenylbutazone   Chloromycetin Haltrain Piroxlcam   Clmetidine heparin Plaquenil   Cllnoril Hyco-pap Ponstel   Clofibrate Hydroxy chloroquine Propoxyphen         Before stopping any of these medications, be sure to consult the physician who ordered them.  Some, such as Coumadin (Warfarin) are ordered to prevent or treat serious conditions such as "deep thrombosis", "pumonary embolisms", and other heart problems.  The amount of time that you may need off of the medication may also vary with the medication and the reason for which you were taking it.  If you are taking any of these medications, please make sure you notify your pain physician before you undergo any procedures.

## 2016-04-22 NOTE — Progress Notes (Signed)
Safety precautions to be maintained throughout the outpatient stay will include: orient to surroundings, keep bed in low position, maintain call bell within reach at all times, provide assistance with transfer out of bed and ambulation.  

## 2016-04-23 ENCOUNTER — Encounter: Payer: Self-pay | Admitting: Unknown Physician Specialty

## 2016-04-23 NOTE — Telephone Encounter (Signed)
Forward to provider

## 2016-04-29 ENCOUNTER — Other Ambulatory Visit: Payer: Self-pay | Admitting: Unknown Physician Specialty

## 2016-04-30 LAB — TOXASSURE SELECT 13 (MW), URINE

## 2016-04-30 NOTE — Progress Notes (Signed)
Quick Note:  Reviewed. ______ 

## 2016-05-04 ENCOUNTER — Ambulatory Visit: Payer: BLUE CROSS/BLUE SHIELD | Attending: Pain Medicine | Admitting: Pain Medicine

## 2016-05-04 ENCOUNTER — Encounter: Payer: Self-pay | Admitting: Pain Medicine

## 2016-05-04 VITALS — BP 111/45 | HR 74 | Temp 97.2°F | Resp 15 | Ht 63.0 in | Wt 269.0 lb

## 2016-05-04 DIAGNOSIS — M25561 Pain in right knee: Secondary | ICD-10-CM | POA: Insufficient documentation

## 2016-05-04 DIAGNOSIS — M1711 Unilateral primary osteoarthritis, right knee: Secondary | ICD-10-CM | POA: Diagnosis not present

## 2016-05-04 DIAGNOSIS — M5416 Radiculopathy, lumbar region: Secondary | ICD-10-CM

## 2016-05-04 DIAGNOSIS — M170 Bilateral primary osteoarthritis of knee: Secondary | ICD-10-CM

## 2016-05-04 DIAGNOSIS — M533 Sacrococcygeal disorders, not elsewhere classified: Secondary | ICD-10-CM

## 2016-05-04 DIAGNOSIS — M48062 Spinal stenosis, lumbar region with neurogenic claudication: Secondary | ICD-10-CM

## 2016-05-04 DIAGNOSIS — M545 Low back pain: Secondary | ICD-10-CM | POA: Diagnosis present

## 2016-05-04 DIAGNOSIS — M47816 Spondylosis without myelopathy or radiculopathy, lumbar region: Secondary | ICD-10-CM

## 2016-05-04 DIAGNOSIS — Z981 Arthrodesis status: Secondary | ICD-10-CM

## 2016-05-04 MED ORDER — CEFUROXIME AXETIL 250 MG PO TABS: 250.0000 mg | ORAL_TABLET | Freq: Two times a day (BID) | ORAL | Status: DC

## 2016-05-04 MED ORDER — ORPHENADRINE CITRATE 30 MG/ML IJ SOLN
60.0000 mg | Freq: Once | INTRAMUSCULAR | Status: AC
  Administered 2016-05-04: 60 mg via INTRAMUSCULAR
  Filled 2016-05-04: qty 2

## 2016-05-04 MED ORDER — TRIAMCINOLONE ACETONIDE 40 MG/ML IJ SUSP
40.0000 mg | Freq: Once | INTRAMUSCULAR | Status: AC
  Administered 2016-05-04: 40 mg
  Filled 2016-05-04: qty 1

## 2016-05-04 MED ORDER — LIDOCAINE HCL (PF) 1 % IJ SOLN
10.0000 mL | Freq: Once | INTRAMUSCULAR | Status: AC
  Administered 2016-05-04: 10 mL via SUBCUTANEOUS
  Filled 2016-05-04: qty 10

## 2016-05-04 MED ORDER — BUPIVACAINE HCL (PF) 0.25 % IJ SOLN
30.0000 mL | Freq: Once | INTRAMUSCULAR | Status: AC
  Administered 2016-05-04: 30 mL
  Filled 2016-05-04: qty 30

## 2016-05-04 MED ORDER — CEFAZOLIN SODIUM 1-5 GM-% IV SOLN
1.0000 g | Freq: Once | INTRAVENOUS | Status: AC
  Administered 2016-05-04: 1 g via INTRAVENOUS

## 2016-05-04 MED ORDER — CEFAZOLIN SODIUM 1 G IJ SOLR
INTRAMUSCULAR | Status: AC
  Administered 2016-05-04: 1 g
  Filled 2016-05-04: qty 10

## 2016-05-04 MED ORDER — LACTATED RINGERS IV SOLN: 1000.0000 mL | INTRAVENOUS | Status: DC

## 2016-05-04 MED ORDER — FENTANYL CITRATE (PF) 100 MCG/2ML IJ SOLN
100.0000 ug | Freq: Once | INTRAMUSCULAR | Status: AC
  Administered 2016-05-04: 100 ug via INTRAVENOUS
  Filled 2016-05-04: qty 2

## 2016-05-04 MED ORDER — MIDAZOLAM HCL 5 MG/5ML IJ SOLN
5.0000 mg | Freq: Once | INTRAMUSCULAR | Status: AC
  Administered 2016-05-04: 5 mg via INTRAVENOUS
  Filled 2016-05-04: qty 5

## 2016-05-04 NOTE — Patient Instructions (Addendum)
PLAN   Continue present medications Soma and hydrocodone acetaminophen. Please get Ceftin antibiotic today and begin taking Ceftin antibiotic as prescribed  F/U PCP [name] for evaliation of  BP and general medical  condition. Please follow-up with [name] as discussed  F/U surgical evaluation. Neurosurgical evaluation as discussed  TENS unit as discussed  F/U neurological evaluation. May consider PNCV/EMG studies and other studies pending follow-up evaluations  May consider radiofrequency rhizolysis or intraspinal procedures pending response to present treatment and F/U evaluation   Patient to call [facility] Management Center should patient have concerns prior to scheduled return appointment.Pain Management Discharge Instructions  General Discharge Instructions :  If you need to reach your doctor call: Monday-Friday 8:00 am - 4:00 pm at [phone] or toll free [phone].  After clinic hours [phone] to have operator reach doctor.  Bring all of your medication bottles to all your appointments in the pain clinic.  To cancel or reschedule your appointment with Pain Management please remember to call 24 hours in advance to avoid a fee.  Refer to the educational materials which you have been given on: General Risks, I had my Procedure. Discharge Instructions, Post Sedation.  Post Procedure Instructions:  The drugs you were given will stay in your system until tomorrow, so for the next 24 hours you should not drive, make any legal decisions or drink any alcoholic beverages.  You may eat anything you prefer, but it is better to start with liquids then soups and crackers, and gradually work up to solid foods.  Please notify your doctor immediately if you have any unusual bleeding, trouble breathing or pain that is not related to your normal pain.  Depending on the type of procedure that was done, some parts of your body may feel week and/or numb.  This usually clears up by tonight  or the next day.  Walk with the use of an assistive device or accompanied by an adult for the 24 hours.  You may use ice on the affected area for the first 24 hours.  Put ice in a Ziploc bag and cover with a towel and place against area 15 minutes on 15 minutes off.  You may switch to heat after 24 hours.Knee Injection A knee injection is a procedure to get medicine into your knee joint. Your health care provider puts a needle into the joint and injects medicine with an attached syringe. The injected medicine may relieve the pain, swelling, and stiffness of arthritis. The injected medicine may also help to lubricate and cushion your knee joint. You may need more than one injection. LET [facility] CARE PROVIDER KNOW ABOUT: 1. Any allergies you have. 2. All medicines you are taking, including vitamins, herbs, eye drops, creams, and over-the-counter medicines. 3. Previous problems you or members of your family have had with the use of anesthetics. 4. Any blood disorders you have. 5. Previous surgeries you have had. 6. Any medical conditions you may have. RISKS AND COMPLICATIONS Generally, this is a safe procedure. However, problems may occur, including:  Infection.  Bleeding.  Worsening symptoms.  Damage to the area around your knee.  Allergic reaction to any of the medicines.  Skin reactions from repeated injections. BEFORE THE PROCEDURE  Ask your health care provider about changing or stopping your regular medicines. This is especially important if you are taking diabetes medicines or blood thinners.  Plan to have someone take you home after the procedure. PROCEDURE  You will sit or lie down in a  position for your knee to be treated.  The skin over your kneecap will be cleaned with a germ-killing solution (antiseptic).  You will be given a medicine that numbs the area (local anesthetic). You may feel some stinging.  After your knee becomes numb, you will have a second  injection. This is the medicine. This needle is carefully placed between your kneecap and your knee. The medicine is injected into the joint space.  At the end of the procedure, the needle will be removed.  A bandage (dressing) may be placed over the injection site. The procedure may vary among health care providers and hospitals. AFTER THE PROCEDURE 1. You may have to move your knee through its full range of motion. This helps to get all of the medicine into your joint space. 2. Your blood pressure, heart rate, breathing rate, and blood oxygen level will be monitored often until the medicines you were given have worn off. 3. You will be watched to make sure that you do not have a reaction to the injected medicine.   This information is not intended to replace advice given to you by your health care provider. Make sure you discuss any questions you have with your health care provider.   Document Released: 02/07/2007 Document Revised: 12/07/2014 Document Reviewed: 09/26/2014 Elsevier Interactive Patient Education 2016 [place]  What are the risk, side effects and possible complications? Generally speaking, most procedures are safe.  However, with any procedure there are risks, side effects, and the possibility of complications.  The risks and complications are dependent upon the sites that are lesioned, or the type of nerve block to be performed.  The closer the procedure is to the spine, the more serious the risks are.  Great care is taken when placing the radio frequency needles, block needles or lesioning probes, but sometimes complications can occur. 1. Infection: Any time there is an injection through the skin, there is a risk of infection.  This is why sterile conditions are used for these blocks.  There are four possible types of infection. 1. Localized skin infection. 2. Central Nervous System Infection-This can be in the form of Meningitis, which can  be deadly. 3. Epidural Infections-This can be in the form of an epidural abscess, which can cause pressure inside of the spine, causing compression of the spinal cord with subsequent paralysis. This would require an emergency surgery to decompress, and there are no guarantees that the patient would recover from the paralysis. 4. Discitis-This is an infection of the intervertebral discs.  It occurs in about 1% of discography procedures.  It is difficult to treat and it may lead to surgery.        2. Pain: the needles have to go through skin and soft tissues, will cause soreness.       3. Damage to internal structures:  The nerves to be lesioned may be near blood vessels or    other nerves which can be potentially damaged.       4. Bleeding: Bleeding is more common if the patient is taking blood thinners such as  aspirin, Coumadin, Ticiid, Plavix, etc., or if he/she have some genetic predisposition  such as hemophilia. Bleeding into the spinal canal can cause compression of the spinal  cord with subsequent paralysis.  This would require an emergency surgery to  decompress and there are no guarantees that the patient would recover from the  paralysis.       5.  Pneumothorax:  Puncturing of a lung is a possibility, every time a needle is introduced in  the area of the chest or upper back.  Pneumothorax refers to free air around the  collapsed lung(s), inside of the thoracic cavity (chest cavity).  Another two possible  complications related to a similar event would include: Hemothorax and Chylothorax.   These are variations of the Pneumothorax, where instead of air around the collapsed  lung(s), you may have blood or chyle, respectively.       6. Spinal headaches: They may occur with any procedures in the area of the spine.       7. Persistent CSF (Cerebro-Spinal Fluid) leakage: This is a rare problem, but may occur  with prolonged intrathecal or epidural catheters either due to the formation of a fistulous   track or a dural tear.       8. Nerve damage: By working so close to the spinal cord, there is always a possibility of  nerve damage, which could be as serious as a permanent spinal cord injury with  paralysis.       9. Death:  Although rare, severe deadly allergic reactions known as "Anaphylactic  reaction" can occur to any of the medications used.      10. Worsening of the symptoms:  We can always make thing worse.  What are the chances of something like this happening? Chances of any of this occuring are extremely low.  By statistics, you have more of a chance of getting killed in a motor vehicle accident: while driving to the hospital than any of the above occurring .  Nevertheless, you should be aware that they are possibilities.  In general, it is similar to taking a shower.  Everybody knows that you can slip, hit your head and get killed.  Does that mean that you should not shower again?  Nevertheless always keep in mind that statistics do not mean anything if you happen to be on the wrong side of them.  Even if a procedure has a 1 (one) in a 1,000,000 (million) chance of going wrong, it you happen to be that one..Also, keep in mind that by statistics, you have more of a chance of having something go wrong when taking medications.  Who should not have this procedure? If you are on a blood thinning medication (e.g. Coumadin, Plavix, see list of "Blood Thinners"), or if you have an active infection going on, you should not have the procedure.  If you are taking any blood thinners, please inform your physician.  How should I prepare for this procedure?  Do not eat or drink anything at least six hours prior to the procedure.  Bring a driver with you .  It cannot be a taxi.  Come accompanied by an adult that can drive you back, and that is strong enough to help you if your legs get weak or numb from the local anesthetic.  Take all of your medicines the morning of the procedure with just enough  water to swallow them.  If you have diabetes, make sure that you are scheduled to have your procedure done first thing in the morning, whenever possible.  If you have diabetes, take only half of your insulin dose and notify our nurse that you have done so as soon as you arrive at the clinic.  If you are diabetic, but only take blood sugar pills (oral hypoglycemic), then do not take them on the morning of your procedure.  You may take them after you have had the procedure.  Do not take aspirin or any aspirin-containing medications, at least eleven (11) days prior to the procedure.  They may prolong bleeding.  Wear loose fitting clothing that may be easy to take off and that you would not mind if it got stained with Betadine or blood.  Do not wear any jewelry or perfume  Remove any nail coloring.  It will interfere with some of our monitoring equipment.  NOTE: Remember that this is not meant to be interpreted as a complete list of all possible complications.  Unforeseen problems may occur.  BLOOD THINNERS The following drugs contain aspirin or other products, which can cause increased bleeding during surgery and should not be taken for 2 weeks prior to and 1 week after surgery.  If you should need take something for relief of minor pain, you may take acetaminophen which is found in Tylenol,m Datril, Anacin-3 and Panadol. It is not blood thinner. The products listed below are.  Do not take any of the products listed below in addition to any listed on your instruction sheet.  A.P.C or A.P.C with Codeine Codeine Phosphate Capsules #3 Ibuprofen Ridaura  ABC compound Congesprin Imuran rimadil  Advil Cope Indocin Robaxisal  Alka-Seltzer Effervescent Pain Reliever and Antacid Coricidin or Coricidin-D  Indomethacin Rufen  Alka-Seltzer plus Cold Medicine Cosprin Ketoprofen S-A-C Tablets  Anacin Analgesic Tablets or Capsules Coumadin Korlgesic Salflex  Anacin Extra Strength Analgesic tablets or  capsules CP-2 Tablets Lanoril Salicylate  Anaprox Cuprimine Capsules Levenox Salocol  Anexsia-D Dalteparin Magan Salsalate  Anodynos Darvon compound Magnesium Salicylate Sine-off  Ansaid Dasin Capsules Magsal Sodium Salicylate  Anturane Depen Capsules Marnal Soma  APF Arthritis pain formula Dewitt's Pills Measurin Stanback  Argesic Dia-Gesic Meclofenamic Sulfinpyrazone  Arthritis Bayer Timed Release Aspirin Diclofenac Meclomen Sulindac  Arthritis pain formula Anacin Dicumarol Medipren Supac  Analgesic (Safety coated) Arthralgen Diffunasal Mefanamic Suprofen  Arthritis Strength Bufferin Dihydrocodeine Mepro Compound Suprol  Arthropan liquid Dopirydamole Methcarbomol with Aspirin Synalgos  ASA tablets/Enseals Disalcid Micrainin Tagament  Ascriptin Doan's Midol Talwin  Ascriptin A/D Dolene Mobidin Tanderil  Ascriptin Extra Strength Dolobid Moblgesic Ticlid  Ascriptin with Codeine Doloprin or Doloprin with Codeine Momentum Tolectin  Asperbuf Duoprin Mono-gesic Trendar  Aspergum Duradyne Motrin or Motrin IB Triminicin  Aspirin plain, buffered or enteric coated Durasal Myochrisine Trigesic  Aspirin Suppositories Easprin Nalfon Trillsate  Aspirin with Codeine Ecotrin Regular or Extra Strength Naprosyn Uracel  Atromid-S Efficin Naproxen Ursinus  Auranofin Capsules Elmiron Neocylate Vanquish  Axotal Emagrin Norgesic Verin  Azathioprine Empirin or Empirin with Codeine Normiflo Vitamin E  Azolid Emprazil Nuprin Voltaren  Bayer Aspirin plain, buffered or children's or timed BC Tablets or powders Encaprin Orgaran Warfarin Sodium  Buff-a-Comp Enoxaparin Orudis Zorpin  Buff-a-Comp with Codeine Equegesic Os-Cal-Gesic   Buffaprin Excedrin plain, buffered or Extra Strength Oxalid   Bufferin Arthritis Strength Feldene Oxphenbutazone   Bufferin plain or Extra Strength Feldene Capsules Oxycodone with Aspirin   Bufferin with Codeine Fenoprofen Fenoprofen Pabalate or Pabalate-SF   Buffets II Flogesic  Panagesic   Buffinol plain or Extra Strength Florinal or Florinal with Codeine Panwarfarin   Buf-Tabs Flurbiprofen Penicillamine   Butalbital Compound Four-way cold tablets Penicillin   Butazolidin Fragmin Pepto-Bismol   Carbenicillin Geminisyn Percodan   Carna Arthritis Reliever Geopen Persantine   Carprofen Gold's salt Persistin   Chloramphenicol Goody's Phenylbutazone   Chloromycetin Haltrain Piroxlcam   Clmetidine heparin Plaquenil   Cllnoril Hyco-pap Ponstel   Clofibrate Hydroxy chloroquine  Propoxyphen         Before stopping any of these medications, be sure to consult the physician who ordered them.  Some, such as Coumadin (Warfarin) are ordered to prevent or treat serious conditions such as "deep thrombosis", "pumonary embolisms", and other heart problems.  The amount of time that you may need off of the medication may also vary with the medication and the reason for which you were taking it.  If you are taking any of these medications, please make sure you notify your pain physician before you undergo any procedures.

## 2016-05-04 NOTE — Progress Notes (Signed)
Subjective:    Patient ID: [name], female    DOB: 10/20/1956, 60 y.o.   MRN: [id number]  HPI  Geniculate nerve blocks of the Right knee   The patient is a 60 y.o. female who returns to the [place] for further evaluation and treatment of pain involving the lumbar lower extremity region with severe pain of the Right knee. Prior studies reveal patient to be with significant degenerative joint disease of the knee.  We will proceed with geniculate nerve blocks of the right knee in an attempt to decrease severity of symptoms, minimize the risk of medication escalation, hopefully retard progression of symptoms and avoid the need for more involved treatment.  The risks benefits and expectations of the procedure were discussed with the patient. The patient was with understanding and in agreement with suggested treatment plan.  DESCRIPTION OF PROCEDURE: Geniculate nerve blocks of the right knee. The  procedure was performed with IV Versed and IV fentanyl, conscious sedation and under fluoroscopic guidance.  NEEDLE PLACEMENT FOR BLOCK OF THE LATERAL SUPERIOR GENICULATE NERVE: The patient was taking to the fluoroscopy suite. With the patient supine, with knee in flexed position, Betadine prep of proposed entry site accomplished.  IV Versed, IV fentanyl conscious sedation, EKG, blood pressure, pulse, capnography, and pulse oximetry monitoring were all in place. Under fluoroscopic guidance, a 22-gauge needle was inserted in the region of the right knee with needle placed at the lateral border of the femur at the junction of the shaft of the femur and the condyle of the femur.  Following needle placement at the lateral aspect of the knee, needle placement was then accomplished in the region of the medial aspect of the knee.  NEEDLE PLACEMENT FOR BLOCK OF THE MEDIAL SUPERIOR GENICULATE NERVE:  Under fluoroscopic guidance, a 22 - gauge needle was inserted in the region of the right knee with  needle placed at the medial border of the femur at the junction of the shaft of the femur and the condyle of the femur.   NEEDLE PLACEMENT FOR BLOCK OF THE MEDIAL INFERIOR GENICULATE NERVE:  Under fluoroscopic guidance, a 22 - gauge needle was inserted in the region of the right knee with needle placed at the junction of the shaft and plateau of the tibia.   Following needle placement on AP view of needles placed in all three locations, placement was then verified on lateral view with the tips of the superior lateral and superior medial needles documented to be one half the distance of the shaft of the femur and the tip of the inferior medial geniculate needle documented to be one half the distance of the shaft of the tibia.  Following documentation of needle placements on lateral view, each needle was injected with one mL of 0.25% bupivacaine with Kenalog.  Myoneural block injection of the gluteal musculature region Following Betadine prep of proposed entry site 22-gauge needle was inserted in the gluteal musculature region on the right following negative aspiration 5 cc of 0.25% bupivacaine with Norflex was injected for myoneural block injection of the gluteal musculature region 1  A total of 10 mg of Kenalog was utilized for the entire procedure.   The patient tolerated the procedure well.    PLAN 1. Medications: Continue present medications Soma and hydrocodone acetaminophen 2. Follow-up appointment with PCP [name] for evaluation of blood pressure and general medical condition. 3. Follow-up surgical evaluation . Has been addressed 4. Follow-up neurological evaluation. Has been addressed  5. The patient may be a candidate for radiofrequency rhizolysis and other treatment pending response to treatment on today's visit and follow-up evaluation. 6. The patient is advised to adhere to proper body mechanics and avoid activities which appear to aggravate condition   Review of Systems      Objective:   Physical Exam        Assessment & Plan:

## 2016-05-04 NOTE — Progress Notes (Signed)
Safety precautions to be maintained throughout the outpatient stay will include: orient to surroundings, keep bed in low position, maintain call bell within reach at all times, provide assistance with transfer out of bed and ambulation.  

## 2016-05-05 ENCOUNTER — Telehealth: Payer: Self-pay | Admitting: *Deleted

## 2016-05-05 NOTE — Telephone Encounter (Signed)
Spoke with patient re; procedure, verbalizes complications or concerns.

## 2016-05-16 ENCOUNTER — Other Ambulatory Visit: Payer: Self-pay | Admitting: Unknown Physician Specialty

## 2016-05-20 ENCOUNTER — Ambulatory Visit: Payer: BLUE CROSS/BLUE SHIELD | Attending: Pain Medicine | Admitting: Pain Medicine

## 2016-05-20 ENCOUNTER — Encounter: Payer: Self-pay | Admitting: Pain Medicine

## 2016-05-20 VITALS — BP 109/46 | HR 72 | Temp 98.2°F | Resp 20 | Ht 63.0 in | Wt 269.0 lb

## 2016-05-20 DIAGNOSIS — M170 Bilateral primary osteoarthritis of knee: Secondary | ICD-10-CM | POA: Diagnosis not present

## 2016-05-20 DIAGNOSIS — M79606 Pain in leg, unspecified: Secondary | ICD-10-CM | POA: Diagnosis present

## 2016-05-20 DIAGNOSIS — M5416 Radiculopathy, lumbar region: Secondary | ICD-10-CM

## 2016-05-20 DIAGNOSIS — M5116 Intervertebral disc disorders with radiculopathy, lumbar region: Secondary | ICD-10-CM | POA: Insufficient documentation

## 2016-05-20 DIAGNOSIS — M545 Low back pain: Secondary | ICD-10-CM | POA: Diagnosis present

## 2016-05-20 DIAGNOSIS — M7060 Trochanteric bursitis, unspecified hip: Secondary | ICD-10-CM | POA: Insufficient documentation

## 2016-05-20 DIAGNOSIS — M533 Sacrococcygeal disorders, not elsewhere classified: Secondary | ICD-10-CM | POA: Diagnosis not present

## 2016-05-20 DIAGNOSIS — M4806 Spinal stenosis, lumbar region: Secondary | ICD-10-CM | POA: Insufficient documentation

## 2016-05-20 DIAGNOSIS — M48062 Spinal stenosis, lumbar region with neurogenic claudication: Secondary | ICD-10-CM

## 2016-05-20 DIAGNOSIS — M47816 Spondylosis without myelopathy or radiculopathy, lumbar region: Secondary | ICD-10-CM

## 2016-05-20 DIAGNOSIS — Z981 Arthrodesis status: Secondary | ICD-10-CM

## 2016-05-20 DIAGNOSIS — M1288 Other specific arthropathies, not elsewhere classified, other specified site: Secondary | ICD-10-CM | POA: Diagnosis not present

## 2016-05-20 DIAGNOSIS — M62830 Muscle spasm of back: Secondary | ICD-10-CM | POA: Diagnosis not present

## 2016-05-20 MED ORDER — HYDROCODONE-ACETAMINOPHEN 10-325 MG PO TABS: ORAL_TABLET | ORAL | Status: DC

## 2016-05-20 MED ORDER — CARISOPRODOL 350 MG PO TABS: ORAL_TABLET | ORAL | Status: DC

## 2016-05-20 NOTE — Patient Instructions (Addendum)
PLAN   Continue present medications Soma and hydrocodone acetaminophen  Lumbosacral selective nerve root block to be performed at time of return appointment  F/U PCP [name] for evaliation of  BP and general medical  condition. . Please see PCP  C  [name] this week as discussed   F/U surgical evaluation as discussed  F/U neurological evaluation. May consider pending follow-up evaluations  May consider radiofrequency rhizolysis or intraspinal procedures pending response to present treatment and F/U evaluation   Patient to call Pain Management Center should patient have concerns prior to scheduled return appointmentSelective Nerve Root Block Patient Information  Description: Specific nerve roots exit the spinal canal and these nerves can be compressed and inflamed by a bulging disc and bone spurs.  By injecting steroids on the nerve root, we can potentially decrease the inflammation surrounding these nerves, which often leads to decreased pain.  Also, by injecting local anesthesia on the nerve root, this can provide [place] helpful information to give to your referring doctor if it decreases your pain.  Selective nerve root blocks can be done along the spine from the neck to the low back depending on the location of your pain.   After numbing the skin with local anesthesia, a small needle is passed to the nerve root and the position of the needle is verified using x-ray pictures.  After the needle is in correct position, we then deposit the medication.  You may experience a pressure sensation while this is being done.  The entire block usually lasts less than 15 minutes.  Conditions that may be treated with selective nerve root blocks:  Low back and leg pain  Spinal stenosis  Diagnostic block prior to potential surgery  Neck and arm pain  Post laminectomy syndrome  Preparation for the injection:  1. Do not eat any solid food or dairy products within 8 hours of your appointment. 2. You may  drink clear liquids up to 3 hours before an appointment.  Clear liquids include water, black coffee, juice or soda.  No milk or cream please. 3. You may take your regular medications, including pain medications, with a sip of water before your appointment.  Diabetics should hold regular insulin (if taken separately) and take 1/2 normal NPH dose the morning of the procedure.  Carry some sugar containing items with you to your appointment. 4. A driver must accompany you and be prepared to drive you home after your procedure. 5. Bring all your current medications with you. 6. An IV may be inserted and sedation may be given at the discretion of the physician. 7. A blood pressure cuff, EKG, and other monitors will often be applied during the procedure.  Some patients may need to have extra oxygen administered for a short period. 8. You will be asked to provide medical information, including allergies, prior to the procedure.  We must know immediately if you are taking blood  Thinners (like Coumadin) or if you are allergic to IV iodine contrast (dye).  Possible side-effects: All are usually temporary  Bleeding from needle site  Light headedness  Numbness and tingling  Decreased blood pressure  Weakness in arms/legs  Pressure sensation in back/neck  Pain at injection site (several days)  Possible complications: All are extremely rare  Infection  Nerve injury  Spinal headache (a headache wore with upright position)  Call if you experience:  Fever/chills associated with headache or increased back/neck pain  Headache worsened by an upright position  New onset weakness or  numbness of an extremity below the injection site  Hives or difficulty breathing (go to the emergency room)  Inflammation or drainage at the injection site(s)  Severe back/neck pain greater than usual  New symptoms which are concerning to you  Please note:  Although the local anesthetic injected can often  make your back or neck feel good for several hours after the injection the pain will likely return.  It takes 3-5 days for steroids to work on the nerve root. You may not notice any pain relief for at least one week.  If effective, we will often do a series of 3 injections spaced 3-6 weeks apart to maximally decrease your pain.    If you have any questions, please call [phone] [facility] ClinicGENERAL RISKS AND COMPLICATIONS  What are the risk, side effects and possible complications? Generally speaking, most procedures are safe.  However, with any procedure there are risks, side effects, and the possibility of complications.  The risks and complications are dependent upon the sites that are lesioned, or the type of nerve block to be performed.  The closer the procedure is to the spine, the more serious the risks are.  Great care is taken when placing the radio frequency needles, block needles or lesioning probes, but sometimes complications can occur. 1. Infection: Any time there is an injection through the skin, there is a risk of infection.  This is why sterile conditions are used for these blocks.  There are four possible types of infection. 1. Localized skin infection. 2. Central Nervous System Infection-This can be in the form of Meningitis, which can be deadly. 3. Epidural Infections-This can be in the form of an epidural abscess, which can cause pressure inside of the spine, causing compression of the spinal cord with subsequent paralysis. This would require an emergency surgery to decompress, and there are no guarantees that the patient would recover from the paralysis. 4. Discitis-This is an infection of the intervertebral discs.  It occurs in about 1% of discography procedures.  It is difficult to treat and it may lead to surgery.        2. Pain: the needles have to go through skin and soft tissues, will cause soreness.       3. Damage to internal  structures:  The nerves to be lesioned may be near blood vessels or    other nerves which can be potentially damaged.       4. Bleeding: Bleeding is more common if the patient is taking blood thinners such as  aspirin, Coumadin, Ticiid, Plavix, etc., or if he/she have some genetic predisposition  such as hemophilia. Bleeding into the spinal canal can cause compression of the spinal  cord with subsequent paralysis.  This would require an emergency surgery to  decompress and there are no guarantees that the patient would recover from the  paralysis.       5. Pneumothorax:  Puncturing of a lung is a possibility, every time a needle is introduced in  the area of the chest or upper back.  Pneumothorax refers to free air around the  collapsed lung(s), inside of the thoracic cavity (chest cavity).  Another two possible  complications related to a similar event would include: Hemothorax and Chylothorax.   These are variations of the Pneumothorax, where instead of air around the collapsed  lung(s), you may have blood or chyle, respectively.       6. Spinal headaches: They may occur with any  procedures in the area of the spine.       7. Persistent CSF (Cerebro-Spinal Fluid) leakage: This is a rare problem, but may occur  with prolonged intrathecal or epidural catheters either due to the formation of a fistulous  track or a dural tear.       8. Nerve damage: By working so close to the spinal cord, there is always a possibility of  nerve damage, which could be as serious as a permanent spinal cord injury with  paralysis.       9. Death:  Although rare, severe deadly allergic reactions known as "Anaphylactic  reaction" can occur to any of the medications used.      10. Worsening of the symptoms:  We can always make thing worse.  What are the chances of something like this happening? Chances of any of this occuring are extremely low.  By statistics, you have more of a chance of getting killed in a motor vehicle  accident: while driving to the hospital than any of the above occurring .  Nevertheless, you should be aware that they are possibilities.  In general, it is similar to taking a shower.  Everybody knows that you can slip, hit your head and get killed.  Does that mean that you should not shower again?  Nevertheless always keep in mind that statistics do not mean anything if you happen to be on the wrong side of them.  Even if a procedure has a 1 (one) in a 1,000,000 (million) chance of going wrong, it you happen to be that one..Also, keep in mind that by statistics, you have more of a chance of having something go wrong when taking medications.  Who should not have this procedure? If you are on a blood thinning medication (e.g. Coumadin, Plavix, see list of "Blood Thinners"), or if you have an active infection going on, you should not have the procedure.  If you are taking any blood thinners, please inform your physician.  How should I prepare for this procedure?  Do not eat or drink anything at least six hours prior to the procedure.  Bring a driver with you .  It cannot be a taxi.  Come accompanied by an adult that can drive you back, and that is strong enough to help you if your legs get weak or numb from the local anesthetic.  Take all of your medicines the morning of the procedure with just enough water to swallow them.  If you have diabetes, make sure that you are scheduled to have your procedure done first thing in the morning, whenever possible.  If you have diabetes, take only half of your insulin dose and notify our nurse that you have done so as soon as you arrive at the clinic.  If you are diabetic, but only take blood sugar pills (oral hypoglycemic), then do not take them on the morning of your procedure.  You may take them after you have had the procedure.  Do not take aspirin or any aspirin-containing medications, at least eleven (11) days prior to the procedure.  They may prolong  bleeding.  Wear loose fitting clothing that may be easy to take off and that you would not mind if it got stained with Betadine or blood.  Do not wear any jewelry or perfume  Remove any nail coloring.  It will interfere with some of our monitoring equipment.  NOTE: Remember that this is not meant to be interpreted as a complete  list of all possible complications.  Unforeseen problems may occur.  BLOOD THINNERS The following drugs contain aspirin or other products, which can cause increased bleeding during surgery and should not be taken for 2 weeks prior to and 1 week after surgery.  If you should need take something for relief of minor pain, you may take acetaminophen which is found in Tylenol,m Datril, Anacin-3 and Panadol. It is not blood thinner. The products listed below are.  Do not take any of the products listed below in addition to any listed on your instruction sheet.  A.P.C or A.P.C with Codeine Codeine Phosphate Capsules #3 Ibuprofen Ridaura  ABC compound Congesprin Imuran rimadil  Advil Cope Indocin Robaxisal  Alka-Seltzer Effervescent Pain Reliever and Antacid Coricidin or Coricidin-D  Indomethacin Rufen  Alka-Seltzer plus Cold Medicine Cosprin Ketoprofen S-A-C Tablets  Anacin Analgesic Tablets or Capsules Coumadin Korlgesic Salflex  Anacin Extra Strength Analgesic tablets or capsules CP-2 Tablets Lanoril Salicylate  Anaprox Cuprimine Capsules Levenox Salocol  Anexsia-D Dalteparin Magan Salsalate  Anodynos Darvon compound Magnesium Salicylate Sine-off  Ansaid Dasin Capsules Magsal Sodium Salicylate  Anturane Depen Capsules Marnal Soma  APF Arthritis pain formula Dewitt's Pills Measurin Stanback  Argesic Dia-Gesic Meclofenamic Sulfinpyrazone  Arthritis Bayer Timed Release Aspirin Diclofenac Meclomen Sulindac  Arthritis pain formula Anacin Dicumarol Medipren Supac  Analgesic (Safety coated) Arthralgen Diffunasal Mefanamic Suprofen  Arthritis Strength Bufferin Dihydrocodeine  Mepro Compound Suprol  Arthropan liquid Dopirydamole Methcarbomol with Aspirin Synalgos  ASA tablets/Enseals Disalcid Micrainin Tagament  Ascriptin Doan's Midol Talwin  Ascriptin A/D Dolene Mobidin Tanderil  Ascriptin Extra Strength Dolobid Moblgesic Ticlid  Ascriptin with Codeine Doloprin or Doloprin with Codeine Momentum Tolectin  Asperbuf Duoprin Mono-gesic Trendar  Aspergum Duradyne Motrin or Motrin IB Triminicin  Aspirin plain, buffered or enteric coated Durasal Myochrisine Trigesic  Aspirin Suppositories Easprin Nalfon Trillsate  Aspirin with Codeine Ecotrin Regular or Extra Strength Naprosyn Uracel  Atromid-S Efficin Naproxen Ursinus  Auranofin Capsules Elmiron Neocylate Vanquish  Axotal Emagrin Norgesic Verin  Azathioprine Empirin or Empirin with Codeine Normiflo Vitamin E  Azolid Emprazil Nuprin Voltaren  Bayer Aspirin plain, buffered or children's or timed BC Tablets or powders Encaprin Orgaran Warfarin Sodium  Buff-a-Comp Enoxaparin Orudis Zorpin  Buff-a-Comp with Codeine Equegesic Os-Cal-Gesic   Buffaprin Excedrin plain, buffered or Extra Strength Oxalid   Bufferin Arthritis Strength Feldene Oxphenbutazone   Bufferin plain or Extra Strength Feldene Capsules Oxycodone with Aspirin   Bufferin with Codeine Fenoprofen Fenoprofen Pabalate or Pabalate-SF   Buffets II Flogesic Panagesic   Buffinol plain or Extra Strength Florinal or Florinal with Codeine Panwarfarin   Buf-Tabs Flurbiprofen Penicillamine   Butalbital Compound Four-way cold tablets Penicillin   Butazolidin Fragmin Pepto-Bismol   Carbenicillin Geminisyn Percodan   Carna Arthritis Reliever Geopen Persantine   Carprofen Gold's salt Persistin   Chloramphenicol Goody's Phenylbutazone   Chloromycetin Haltrain Piroxlcam   Clmetidine heparin Plaquenil   Cllnoril Hyco-pap Ponstel   Clofibrate Hydroxy chloroquine Propoxyphen         Before stopping any of these medications, be sure to consult the physician who ordered  them.  Some, such as Coumadin (Warfarin) are ordered to prevent or treat serious conditions such as "deep thrombosis", "pumonary embolisms", and other heart problems.  The amount of time that you may need off of the medication may also vary with the medication and the reason for which you were taking it.  If you are taking any of these medications, please make sure you notify your pain physician before you undergo  any procedures.

## 2016-05-20 NOTE — Progress Notes (Signed)
   Subjective:    Patient ID: EMELYNN JOURDAN, female    DOB: May 30, 1956, 60 y.o.   MRN: [id number]  HPI the patient is a 60 year old female who returns to pain management for further evaluation and treatment of pain involving the lower back lower extremity region predominantly the patient states that she has some improvement of pain involving the region of the knee following interventional treatment consisting of genicular  nerve blocks of the knee.. The patient states the predominant pain is present time involves the lower back lower extremity region with pain radiating to the right lower extremity. The patient stated that the pain was associated with significant muscle spasms as well area the patient states pain was aggravated by activity as on the feet and appeared to become more intense as the day progressed. The patient denies trauma change in events of daily living the call significant change in symptomatology. We will continue patient's medications consisting of Soma and hydrocodone acetaminophen and we will proceed with scheduling patient for lumbosacral selective nerve root block to be performed at time of return appointment. The patient was with understanding and agreed to suggested treatment plan.  Review of Systems     Objective:   Physical Exam  There was tenderness of the splenius capitis and occipitalis region a mild degree with mild tenderness over the region of the acromioclavicular and glenohumeral joint regions. The patient was with bilaterally equal grip strength and Tinel and Phalen's maneuver were without increased pain of significant degree. Palpation over the thoracic region was attends to palpation of moderate degree with no crepitus of the thoracic region noted. Palpation over the lumbar paraspinal musculatures and lumbar facet region was with moderate the severe tenderness to palpation right greater than left with severe increase of pain with palpation over the lumbar facet  lumbar paraspinal musculature region. There was moderately severe tenderness to palpation of the PSIS and PII S region right greater than the left. EHL strength appeared to be decreased. There was questionably decreased sensation of the L5 dermatomal distribution. Straight leg raise was tolerates approximately 20 with questionably increased pain with dorsiflexion noted. There was negative clonus negative Homans. Knee was with tenderness to palpation without increased warmth or erythema. Crepitus of the knee was noted there was mild increased pain with range of motion maneuvers of the knees there was negative clonus negative Homans. 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 Multilevel facet arthropathy at L2-3, L3-4, L4-5, with disc space narrowing as well Disc desiccation at T12-L1 to L4-L5  Lumbar facet syndrome  Lumbar radiculopathy  Lumbar stenosis  Greater trochanteric bursitis  Sacroiliac joint dysfunction       PLAN   Continue present medications Soma and hydrocodone acetaminophen  Lumbosacral selective nerve root block to be performed at time of return appointment  F/U PCP [name] for evaliation of  BP and general medical  condition. . Please see PCP  C  [name] this week as discussed   F/U surgical evaluation as discussed  F/U neurological evaluation. May consider pending follow-up evaluations  May consider radiofrequency rhizolysis or intraspinal procedures pending response to present treatment and F/U evaluation   Patient to call Pain Management Center should patient have concerns prior to scheduled return appointment

## 2016-05-20 NOTE — Progress Notes (Signed)
Safety precautions to be maintained throughout the outpatient stay will include: orient to surroundings, keep bed in low position, maintain call bell within reach at all times, provide assistance with transfer out of bed and ambulation.  

## 2016-05-23 ENCOUNTER — Other Ambulatory Visit: Payer: Self-pay | Admitting: Unknown Physician Specialty

## 2016-05-26 ENCOUNTER — Other Ambulatory Visit: Payer: Self-pay | Admitting: Unknown Physician Specialty

## 2016-05-27 ENCOUNTER — Other Ambulatory Visit: Payer: Self-pay | Admitting: Unknown Physician Specialty

## 2016-05-31 ENCOUNTER — Encounter: Payer: Self-pay | Admitting: Unknown Physician Specialty

## 2016-06-10 ENCOUNTER — Encounter: Payer: Self-pay | Admitting: Pain Medicine

## 2016-06-10 ENCOUNTER — Ambulatory Visit: Payer: BLUE CROSS/BLUE SHIELD | Attending: Pain Medicine | Admitting: Pain Medicine

## 2016-06-10 DIAGNOSIS — M1288 Other specific arthropathies, not elsewhere classified, other specified site: Secondary | ICD-10-CM | POA: Diagnosis not present

## 2016-06-10 DIAGNOSIS — M545 Low back pain: Secondary | ICD-10-CM | POA: Insufficient documentation

## 2016-06-10 DIAGNOSIS — M170 Bilateral primary osteoarthritis of knee: Secondary | ICD-10-CM

## 2016-06-10 DIAGNOSIS — M5136 Other intervertebral disc degeneration, lumbar region: Secondary | ICD-10-CM | POA: Insufficient documentation

## 2016-06-10 DIAGNOSIS — M47816 Spondylosis without myelopathy or radiculopathy, lumbar region: Secondary | ICD-10-CM

## 2016-06-10 DIAGNOSIS — M48062 Spinal stenosis, lumbar region with neurogenic claudication: Secondary | ICD-10-CM

## 2016-06-10 DIAGNOSIS — M79606 Pain in leg, unspecified: Secondary | ICD-10-CM | POA: Diagnosis present

## 2016-06-10 DIAGNOSIS — M533 Sacrococcygeal disorders, not elsewhere classified: Secondary | ICD-10-CM

## 2016-06-10 DIAGNOSIS — Z981 Arthrodesis status: Secondary | ICD-10-CM

## 2016-06-10 DIAGNOSIS — M5416 Radiculopathy, lumbar region: Secondary | ICD-10-CM

## 2016-06-10 MED ORDER — CEFUROXIME AXETIL 250 MG PO TABS: 250.0000 mg | ORAL_TABLET | Freq: Two times a day (BID) | ORAL | Status: DC

## 2016-06-10 MED ORDER — LACTATED RINGERS IV SOLN: 1000.0000 mL | INTRAVENOUS | Status: DC

## 2016-06-10 MED ORDER — CEFAZOLIN IN D5W 1 GM/50ML IV SOLN: 1.0000 g | Freq: Once | INTRAVENOUS | Status: DC

## 2016-06-10 MED ORDER — CEFAZOLIN SODIUM 1 G IJ SOLR
INTRAMUSCULAR | Status: AC
  Administered 2016-06-10: 1 g
  Filled 2016-06-10: qty 10

## 2016-06-10 MED ORDER — FENTANYL CITRATE (PF) 100 MCG/2ML IJ SOLN
100.0000 ug | Freq: Once | INTRAMUSCULAR | Status: AC
  Administered 2016-06-10: 50 ug via INTRAVENOUS
  Filled 2016-06-10: qty 2

## 2016-06-10 MED ORDER — TRIAMCINOLONE ACETONIDE 40 MG/ML IJ SUSP
40.0000 mg | Freq: Once | INTRAMUSCULAR | Status: AC
  Administered 2016-06-10: 40 mg
  Filled 2016-06-10: qty 1

## 2016-06-10 MED ORDER — ORPHENADRINE CITRATE 30 MG/ML IJ SOLN
60.0000 mg | Freq: Once | INTRAMUSCULAR | Status: AC
  Administered 2016-06-10: 60 mg via INTRAMUSCULAR
  Filled 2016-06-10: qty 2

## 2016-06-10 MED ORDER — BUPIVACAINE HCL (PF) 0.25 % IJ SOLN
30.0000 mL | Freq: Once | INTRAMUSCULAR | Status: AC
Start: 2016-06-10 — End: 2016-06-10
  Administered 2016-06-10: 30 mL
  Filled 2016-06-10: qty 30

## 2016-06-10 MED ORDER — MIDAZOLAM HCL 5 MG/5ML IJ SOLN
5.0000 mg | Freq: Once | INTRAMUSCULAR | Status: AC
  Administered 2016-06-10: 2 mg via INTRAVENOUS
  Filled 2016-06-10: qty 5

## 2016-06-10 NOTE — Progress Notes (Signed)
Safety precautions to be maintained throughout the outpatient stay will include: orient to surroundings, keep bed in low position, maintain call bell within reach at all times, provide assistance with transfer out of bed and ambulation.  

## 2016-06-10 NOTE — Progress Notes (Signed)
Subjective:    Patient ID: [name], female    DOB: 08-10-1956, 60 y.o.   MRN: [id number]  HPI  PROCEDURE PERFORMED: Lumbosacral selective nerve root block   NOTE: The patient is a 60 y.o. female who returns to [place] for further evaluation and treatment of pain involving the lumbar and lower extremity region. Studies consisting of MRI has revealed the patient to be with evidence of  degenerative disc disease lumbar spine most predominant at L4-5. 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 There is concern regarding intraspinal abnormalities contributing to the patient's symptomatology. . There is concern regarding component of patient's pain began due to lumbar radiculopathy The risks, benefits, and expectations of the procedure have been explained to the patient who was understanding and in agreement with suggested treatment plan. We will proceed with interventional treatment as discussed and as explained to the patient. The patient is understanding and in agreement with suggested treatment plan.   DESCRIPTION OF PROCEDURE: Lumbosacral selective nerve root block with IV Versed, IV fentanyl conscious sedation, EKG, blood pressure, pulse, capnography, and pulse oximetry monitoring. The procedure was performed with the patient in the prone position under fluoroscopic guidance. With the patient in the prone position, Betadine prep of proposed entry site was performed. Local anesthetic skin wheal of proposed needle entry site was prepared with 1.5% plain lidocaine with AP view of the lumbosacral spine.   PROCEDURE #1: Needle placement at the right L 2 vertebral body: A 22 -gauge needle was inserted at the inferior border of the transverse process of the vertebral body with needle placed medial to the midline of the transverse process on AP view of the lumbosacral spine.   NEEDLE PLACEMENT AT  L3, L4,  and L5  VERTEBRAL BODY LEVELS  Needle  placement was accomplished at L3, L4, and L5 vertebral body levels on the right side exactly as was accomplished at the L2  vertebral body level  and utilizing the same technique and under fluoroscopic guidance.   Needle placement was then verified on lateral view at all levels with needle tip documented to be in the posterior superior quadrant of the intervertebral foramen of  L 2, L3, L4, and L5. Following negative aspiration for heme and CSF at each level, each level was injected with 3 mL of 0.25% bupivacaine with Kenalog.   LUMBOSACRAL SELECTIVE NERVE ROOT BLOCKS THE THE  RIGHT SIDE  The procedure was performed on the right side exactly as was performed on the left side and at the same levels  Under fluoroscopic guidance and utilizing the same technique.    The patient tolerated the procedure well. A total of 10 mg of Kenalog was utilized for the procedure.   PLAN:  1. Medications: Will continue presently prescribed medications. Soma and hydrocodone acetaminophen 2. The patient is to undergo follow-up evaluation with PCP for evaluation of blood pressure and general medical condition status post procedure performed on today's visit. 3. Surgical follow-up evaluation ss discussed 4. Neurological evaluation.May consider radiofreque may consider PNCV EMG studies and other studies ncy procedures, implantation type procedures and other treatment pending response to treatment and follow-up evaluation. 5. The patient has been advised do adhere to proper body mechanics and avoid activities which may aggravate condition. 6. The patient has been advised to call the Pain Management Center prior to scheduled return appointment should there be significant change in the patient's condition  or should the patient have other concerns regarding condition prior to scheduled return appointment.   Review of Systems     Objective:   Physical Exam        Assessment  & Plan:

## 2016-06-10 NOTE — Patient Instructions (Addendum)
PLAN   Continue present medications Soma and hydrocodone acetaminophen. Please get Ceftin antibiotic today and begin taking Ceftin antibiotic as prescribed  F/U PCP [name] for evaliation of  BP and general medical  condition. Please follow-up with [name] as discussed  F/U surgical evaluation. Neurosurgical evaluation as discussed  TENS unit as discussed  F/U neurological evaluation. May consider PNCV/EMG studies and other studies pending follow-up evaluations  May consider radiofrequency rhizolysis or intraspinal procedures pending response to present treatment and F/U evaluation   Patient to call Pain Management Center should patient have concerns prior to scheduled return appointment A scrip tfor Ceftin was sent to your pharmacy.  Pain Management Discharge Instructions  General Discharge Instructions :  If you need to reach your doctor call: Monday-Friday 8:00 am - 4:00 pm at [phone] or toll free [phone].  After clinic hours [phone] to have operator reach doctor.  Bring all of your medication bottles to all your appointments in the pain clinic.  To cancel or reschedule your appointment with Pain Management please remember to call 24 hours in advance to avoid a fee.  Refer to the educational materials which you have been given on: General Risks, I had my Procedure. Discharge Instructions, Post Sedation.  Post Procedure Instructions:  The drugs you were given will stay in your system until tomorrow, so for the next 24 hours you should not drive, make any legal decisions or drink any alcoholic beverages.  You may eat anything you prefer, but it is better to start with liquids then soups and crackers, and gradually work up to solid foods.  Please notify your doctor immediately if you have any unusual bleeding, trouble breathing or pain that is not related to your normal pain.  Depending on the type of procedure that was done, some parts of your body may feel  week and/or numb.  This usually clears up by tonight or the next day.  Walk with the use of an assistive device or accompanied by an adult for the 24 hours.  You may use ice on the affected area for the first 24 hours.  Put ice in a Ziploc bag and cover with a towel and place against area 15 minutes on 15 minutes off.  You may switch to heat after 24 hours.

## 2016-06-11 ENCOUNTER — Encounter: Payer: Self-pay | Admitting: Unknown Physician Specialty

## 2016-06-11 ENCOUNTER — Telehealth: Payer: Self-pay | Admitting: Unknown Physician Specialty

## 2016-06-11 ENCOUNTER — Telehealth: Payer: Self-pay | Admitting: *Deleted

## 2016-06-11 NOTE — Telephone Encounter (Signed)
Message left

## 2016-06-11 NOTE — Telephone Encounter (Signed)
Pt would like to have lasix 40mg  and potassium called in to [facility].

## 2016-06-12 MED ORDER — FUROSEMIDE 40 MG PO TABS: 40.0000 mg | ORAL_TABLET | Freq: Every day | ORAL | Status: DC

## 2016-06-12 MED ORDER — POTASSIUM CHLORIDE CRYS ER 20 MEQ PO TBCR: 20.0000 meq | EXTENDED_RELEASE_TABLET | Freq: Every day | ORAL | Status: DC

## 2016-06-12 NOTE — Telephone Encounter (Signed)
I don't see this in my last note.  She is due to see me in August.  I will call in Lasix and K for a month

## 2016-06-12 NOTE — Telephone Encounter (Signed)
Patient returned my call. She stated that she had previously been taking 20 mg of lasix. She states that [place] told her to increase her lasix to 40 mg per day. Patient states that Malachy Mood told her that if this worked for her, that she would send in a rx for 40 mg lasix and a potassium tablet. Pharmacy is [facility] in [place].

## 2016-06-12 NOTE — Telephone Encounter (Signed)
Tried calling patient because potassium is not listed in her chart so I wanted to get clarification on that. I left the patient a voicemail asking for her to please return my call. Will go ahead and route to provider for refill on lasix. Malachy Mood, please route back to me so I can document when I talk to the patient about this potassium.

## 2016-06-13 ENCOUNTER — Other Ambulatory Visit: Payer: Self-pay | Admitting: Unknown Physician Specialty

## 2016-06-16 ENCOUNTER — Other Ambulatory Visit: Payer: Self-pay | Admitting: Unknown Physician Specialty

## 2016-06-18 ENCOUNTER — Ambulatory Visit: Payer: BLUE CROSS/BLUE SHIELD | Attending: Pain Medicine | Admitting: Pain Medicine

## 2016-06-18 ENCOUNTER — Encounter: Payer: Self-pay | Admitting: Pain Medicine

## 2016-06-18 VITALS — BP 155/60 | HR 76 | Temp 98.7°F | Resp 20 | Ht 63.0 in | Wt 269.0 lb

## 2016-06-18 DIAGNOSIS — M5116 Intervertebral disc disorders with radiculopathy, lumbar region: Secondary | ICD-10-CM | POA: Diagnosis not present

## 2016-06-18 DIAGNOSIS — M1288 Other specific arthropathies, not elsewhere classified, other specified site: Secondary | ICD-10-CM | POA: Insufficient documentation

## 2016-06-18 DIAGNOSIS — M47816 Spondylosis without myelopathy or radiculopathy, lumbar region: Secondary | ICD-10-CM

## 2016-06-18 DIAGNOSIS — M4806 Spinal stenosis, lumbar region: Secondary | ICD-10-CM | POA: Diagnosis not present

## 2016-06-18 DIAGNOSIS — Z981 Arthrodesis status: Secondary | ICD-10-CM

## 2016-06-18 DIAGNOSIS — M170 Bilateral primary osteoarthritis of knee: Secondary | ICD-10-CM

## 2016-06-18 DIAGNOSIS — M533 Sacrococcygeal disorders, not elsewhere classified: Secondary | ICD-10-CM | POA: Diagnosis not present

## 2016-06-18 DIAGNOSIS — M7060 Trochanteric bursitis, unspecified hip: Secondary | ICD-10-CM | POA: Insufficient documentation

## 2016-06-18 DIAGNOSIS — M48062 Spinal stenosis, lumbar region with neurogenic claudication: Secondary | ICD-10-CM

## 2016-06-18 DIAGNOSIS — M545 Low back pain: Secondary | ICD-10-CM | POA: Diagnosis present

## 2016-06-18 DIAGNOSIS — M79606 Pain in leg, unspecified: Secondary | ICD-10-CM | POA: Diagnosis present

## 2016-06-18 DIAGNOSIS — M5416 Radiculopathy, lumbar region: Secondary | ICD-10-CM

## 2016-06-18 MED ORDER — CARISOPRODOL 350 MG PO TABS: ORAL_TABLET | ORAL | Status: DC

## 2016-06-18 MED ORDER — HYDROCODONE-ACETAMINOPHEN 10-325 MG PO TABS: ORAL_TABLET | ORAL | Status: DC

## 2016-06-18 NOTE — Patient Instructions (Addendum)
PLAN   Continue present medications Soma and hydrocodone acetaminophen  F/U PCP [name] for evaliation of  BP and general medical  condition. . Please see PCP  C  [name] this week as discussed   F/U surgical evaluation as discussed. We will schedule neurosurgical evaluation of lumbar and lower extremity pain at this time as well as lumbar MRI  Please ask the nurses and secretary the date of your neurosurgical evaluation and your lumbar MRI  F/U neurological evaluation. May consider pending follow-up evaluations  May consider radiofrequency rhizolysis or intraspinal procedures pending response to present treatment and F/U evaluation   Patient to call Pain Management Center should patient have concerns prior to scheduled return appointment

## 2016-06-18 NOTE — Progress Notes (Signed)
   Subjective:    Patient ID: [name], female    DOB: 01/25/1956, 60 y.o.   MRN: [id number]  HPI  The patient is a 60 year old female who returns to pain management for further evaluation and treatment of pain involving the lumbar and lower extremity region predominantly and especially the right lower extremity region. The patient is with severely disabling pain with standing walking with pain foraminal intense as the day progresses. Pain interferes the patient ability to obtain restlessly the procedure well. We discussed patient's condition and we will proceed with MRI lumbar spine Neurosurgical evaluation at this time. The patient is to call pain management prior to scheduled return appointment should there be significant change in condition or patient have concerns regarding condition prior to scheduled return appointment. The patient will continue Soma and hydrocodone acetaminophen at this time. All agreed to suggested treatment plan  Review of Systems     Objective:   Physical Exam   There was tenderness of the splenius capitis and occipitalis region palpation which reproduces mild discomfort. There was mild tenderness of the acromioclavicular and glenohumeral joint region and patient was with what appeared to be bilaterally equal grip strength. Tinel and Phalen's maneuver were without increase of pain of significant degree. The patient was able to perform drop test with mild difficulty. There appeared to be unremarkable Spurling's maneuver. Palpation over the thoracic region was without crepitus of the thoracic region. There was evidence of muscle spasm involving the lower thoracic region. Palpation over the lumbar region was with moderately severe discomfort with lateral bending rotation extension and palpation of the lumbar facets reproducing moderately severe discomfort. Straight leg raising was limited to 20 without a definite increased pain with dorsiflexion noted. No definite  sensory deficit of dermatomal distribution was detected. There appeared to be negative clonus negative Homans. Palpation over the PSIS and PII S regions reproduce moderate discomfort. There was moderate tenderness on the greater trochanteric region iliotibial band region. The knees were tenderness to palpation with negative anterior and posterior drawer signs with crepitus of the knees. DTRs were difficult to elicit . Abdomen nontender with no costovertebral tenderness noted     Assessment & Plan:      Degenerative joint disease of knees  Degenerative disc disease lumbar spine Most predominant at L4-5. Multilevel facet arthropathy at L2-3, L3-4, L4-5, with disc space narrowing as well Disc desiccation at T12-L1 to L4-L5  Lumbar facet syndrome  Lumbar radiculopathy  Lumbar stenosis  Greater trochanteric bursitis  Sacroiliac joint dysfunction      PLAN   Continue present medications Soma and hydrocodone acetaminophen  F/U PCP [name] for evaliation of  BP and general medical  condition. . Please see PCP  C  [name] this week as discussed   F/U surgical evaluation as discussed. We will schedule neurosurgical evaluation of lumbar and lower extremity pain at this time as well as lumbar MRI  Please ask the nurses and secretary the date of your neurosurgical evaluation and your lumbar MRI  F/U neurological evaluation. May consider pending follow-up evaluations  May consider radiofrequency rhizolysis or intraspinal procedures pending response to present treatment and F/U evaluation   Patient to call Pain Management Center should patient have concerns prior to scheduled return appointment

## 2016-06-18 NOTE — Progress Notes (Signed)
Safety precautions to be maintained throughout the outpatient stay will include: orient to surroundings, keep bed in low position, maintain call bell within reach at all times, provide assistance with transfer out of bed and ambulation.  

## 2016-06-21 ENCOUNTER — Other Ambulatory Visit: Payer: Self-pay | Admitting: Unknown Physician Specialty

## 2016-06-25 ENCOUNTER — Other Ambulatory Visit: Payer: Self-pay | Admitting: Unknown Physician Specialty

## 2016-06-25 NOTE — Telephone Encounter (Signed)
Your patient 

## 2016-07-09 ENCOUNTER — Other Ambulatory Visit: Payer: Self-pay | Admitting: Unknown Physician Specialty

## 2016-07-10 ENCOUNTER — Other Ambulatory Visit: Payer: Self-pay | Admitting: Unknown Physician Specialty

## 2016-07-19 ENCOUNTER — Other Ambulatory Visit: Payer: Self-pay | Admitting: Unknown Physician Specialty

## 2016-07-20 ENCOUNTER — Encounter: Payer: Self-pay | Admitting: Pain Medicine

## 2016-07-20 ENCOUNTER — Ambulatory Visit: Payer: BLUE CROSS/BLUE SHIELD | Attending: Pain Medicine | Admitting: Pain Medicine

## 2016-07-20 VITALS — BP 124/61 | HR 87 | Temp 98.5°F | Resp 16 | Ht 63.0 in | Wt 265.0 lb

## 2016-07-20 DIAGNOSIS — M4806 Spinal stenosis, lumbar region: Secondary | ICD-10-CM | POA: Diagnosis not present

## 2016-07-20 DIAGNOSIS — M1288 Other specific arthropathies, not elsewhere classified, other specified site: Secondary | ICD-10-CM | POA: Diagnosis not present

## 2016-07-20 DIAGNOSIS — M5124 Other intervertebral disc displacement, thoracic region: Secondary | ICD-10-CM | POA: Insufficient documentation

## 2016-07-20 DIAGNOSIS — K429 Umbilical hernia without obstruction or gangrene: Secondary | ICD-10-CM | POA: Diagnosis not present

## 2016-07-20 DIAGNOSIS — M48062 Spinal stenosis, lumbar region with neurogenic claudication: Secondary | ICD-10-CM

## 2016-07-20 DIAGNOSIS — M7989 Other specified soft tissue disorders: Secondary | ICD-10-CM | POA: Diagnosis not present

## 2016-07-20 DIAGNOSIS — M47896 Other spondylosis, lumbar region: Secondary | ICD-10-CM | POA: Insufficient documentation

## 2016-07-20 DIAGNOSIS — M5116 Intervertebral disc disorders with radiculopathy, lumbar region: Secondary | ICD-10-CM | POA: Diagnosis not present

## 2016-07-20 DIAGNOSIS — M5126 Other intervertebral disc displacement, lumbar region: Secondary | ICD-10-CM | POA: Diagnosis not present

## 2016-07-20 DIAGNOSIS — M545 Low back pain: Secondary | ICD-10-CM | POA: Diagnosis present

## 2016-07-20 DIAGNOSIS — M79606 Pain in leg, unspecified: Secondary | ICD-10-CM | POA: Diagnosis present

## 2016-07-20 DIAGNOSIS — M47816 Spondylosis without myelopathy or radiculopathy, lumbar region: Secondary | ICD-10-CM

## 2016-07-20 DIAGNOSIS — Z981 Arthrodesis status: Secondary | ICD-10-CM

## 2016-07-20 DIAGNOSIS — M170 Bilateral primary osteoarthritis of knee: Secondary | ICD-10-CM | POA: Insufficient documentation

## 2016-07-20 DIAGNOSIS — M5136 Other intervertebral disc degeneration, lumbar region: Secondary | ICD-10-CM

## 2016-07-20 MED ORDER — CARISOPRODOL 350 MG PO TABS: ORAL_TABLET | ORAL | 0 refills | Status: DC

## 2016-07-20 MED ORDER — HYDROCODONE-ACETAMINOPHEN 10-325 MG PO TABS: ORAL_TABLET | ORAL | 0 refills | Status: DC

## 2016-07-20 NOTE — Progress Notes (Signed)
The patient is a 60 year old female who returns to pain management for further evaluation and treatment of pain involving the lumbar and lower extremity region with pain radiating from the lumbar region to the right lower extremity and continuing distally to the toes. The patient states the pain radiation the lumbar region continuing to the buttocks and goes down the front of the lower extremity in the region of the shin. Patient states the pain does go to the toes. We reviewed MRI findings on today's visit and schedule patient for neurosurgical evaluation. We will also discussed patient's general medical condition in terms of swelling of the lower extremities and informed patient that we wish to have patient follow up with her PCP [name] for evaluation of her lower extremity swelling as well as to discuss the Neurontin dose. The patient has had slight impairment of her renal condition which could also aggravate the side effects of Neurontin. We will continue Soma and hydrocodone acetaminophen at this time as prescribed. The patient will undergo neurosurgical evaluation and we will remain available to consider interventional treatment as well as additional modifications of treatment regimen pending follow-up evaluation. All agreed to suggested treatment plan. The patient is also had pain involving the knees in addition to lumbar lower extremity pain felt to be due to intraspinal abnormalities of the lumbar region.     Physical examination  There was tenderness of the paraspinal musculature region cervical and cervical facet region palpation which reproduces pain of mild-to-moderate degree with mild to moderate tenderness of the cervical and thoracic facet paraspinal musculature regions. The patient was with unremarkable Spurling's maneuver and appeared to be with bilaterally equal grip strength with Tinel and Phalen's maneuver reproducing minimal discomfort. Palpation over the region of the thoracic  region was with tenderness to palpation of the lower thoracic region with no crepitus of the thoracic region noted. Palpation over the lumbar paraspinal musculature region lumbar facet region reproduced severe pain on the right compared to the left with palpation over the PSIS and PII S regions reproducing moderate to moderately severe discomfort as well. Straight leg raising was tolerates approximately 20 with questionably increased pain with dorsiflexion noted. No definite sensory deficit or dermatomal distribution was detected. There appeared to be negative clonus negative Homans. The knees were with tenderness to palpation with crepitus of the knees with negative anterior and posterior drawer signs without ballottement of the patella. There was 1+ lower extremity edema with negative clonus negative Homans.     Assessment    Degenerative joint disease of knees  Degenerative disc disease lumbar spine  MRI 07/07/2016 revealed moderate degenerative disc disease of the lower thoracic and lower lumbar spine with moderate degenerative facet changes of the lower lumbar spine. Central disc extrusion extending inferiorly from T 11 T12 approaches the anterior cord. Protrusion with tiny left central extrusion at L4-L5 was the left anterior thecal sac approaching the traversing left side at L5 nerve root. Umbilical hernia containing mental fat noted Degenerative changes most predominant at L4-5. Multilevel facet arthropathy at L2-3, L3-4, L4-5, with disc space narrowing as well Disc desiccation at T12-L1 to L4-L5  Lumbar facet syndrome  Lumbar radiculopathy  Lumbar stenosis with neurogenic claudication  Greater trochanteric bursitis  Sacroiliac joint dysfunction     PLAN   Continue present medications Soma and hydrocodone acetaminophen.. Please discuss your Neurontin dose with PCP [name] since there is concern regarding lower extremity swelling as well as renal function  F/U PCP  C  [name] for evaliation of  BP lower extremity swelling and general medical  condition as we discussed today  F/U surgical evaluation. May consider pending follow-up evaluations  F/U neurological evaluation. May consider pending follow-up evaluations  Ask the nurses and secretary the date of your neurosurgical evaluation of lumbar and lower extremity pain  May consider radiofrequency rhizolysis or intraspinal procedures pending response to present treatment and F/U evaluation   Patient to call Pain Management Center should patient have concerns prior to scheduled return appointment.

## 2016-07-20 NOTE — Telephone Encounter (Signed)
Your patient 

## 2016-07-20 NOTE — Patient Instructions (Addendum)
PLAN   Continue present medications Soma and hydrocodone acetaminophen.. Please discuss your Neurontin dose with PCP [name] since there is concern regarding lower extremity swelling as well as renal function  F/U PCP [name] for evaliation of  BP lower extremity swelling and general medical  condition as we discussed today  F/U surgical evaluation. May consider pending follow-up evaluations  F/U neurological evaluation. May consider pending follow-up evaluations  Ask the nurses and secretary the date of your neurosurgical evaluation of lumbar and lower extremity pain  May consider radiofrequency rhizolysis or intraspinal procedures pending response to present treatment and F/U evaluation   Patient to call Pain Management Center should patient have concerns prior to scheduled return appointment.

## 2016-07-27 ENCOUNTER — Other Ambulatory Visit: Payer: Self-pay | Admitting: Unknown Physician Specialty

## 2016-07-27 NOTE — Telephone Encounter (Signed)
Your patient 

## 2016-07-28 ENCOUNTER — Ambulatory Visit (INDEPENDENT_AMBULATORY_CARE_PROVIDER_SITE_OTHER): Payer: BLUE CROSS/BLUE SHIELD | Admitting: Unknown Physician Specialty

## 2016-07-28 ENCOUNTER — Encounter: Payer: Self-pay | Admitting: Unknown Physician Specialty

## 2016-07-28 VITALS — BP 132/70 | HR 102 | Temp 99.1°F

## 2016-07-28 DIAGNOSIS — R0602 Shortness of breath: Secondary | ICD-10-CM

## 2016-07-28 DIAGNOSIS — J449 Chronic obstructive pulmonary disease, unspecified: Secondary | ICD-10-CM | POA: Diagnosis not present

## 2016-07-28 DIAGNOSIS — I10 Essential (primary) hypertension: Secondary | ICD-10-CM

## 2016-07-28 DIAGNOSIS — R600 Localized edema: Secondary | ICD-10-CM

## 2016-07-28 NOTE — Progress Notes (Signed)
   BP 132/70 (BP Location: Left Arm, Patient Position: Sitting, Cuff Size: Large)   Pulse (!) 102   Temp 99.1 F (37.3 C)   LMP  (LMP Unknown)   SpO2 93%    Subjective:    Patient ID: [name], female    DOB: [birth year], 60 y.o.   MRN: [id number]  HPI: [name] is a 61 y.o. female  Chief Complaint  Patient presents with  . Depression  . Hyperlipidemia  . Hypertension  . COPD  . Leg Swelling    pt states Dr. Primus Bravo wanted patient to talk to [facility] about swelling in legs and feet, and gabapentin  . Gastroesophageal Reflux   Pt is here today with a CC of swelling in legs and pain in legs when walking.  She is SOB.  She is taking [place] and Proair since last seen.  States legs have been swelling for a while.  She is currently taking 40 mg of Lasix daily.  Last echo was 1/26 and had an EF of 60-65%.  Chest x-ray was done at the same time.    Dr. Josefa Half wants her to see neurosurgery.  He is of the opinion that the Gabapentin causes leg swelling.      Relevant past medical, surgical, family and social history reviewed and updated as indicated. Interim medical history since our last visit reviewed. Allergies and medications reviewed and updated.  Review of Systems  Per HPI unless specifically indicated above     Objective:    BP 132/70 (BP Location: Left Arm, Patient Position: Sitting, Cuff Size: Large)   Pulse (!) 102   Temp 99.1 F (37.3 C)   LMP  (LMP Unknown)   SpO2 93%   Wt Readings from Last 3 Encounters:  07/20/16 265 lb (120.2 kg)  06/18/16 269 lb (122 kg)  06/10/16 269 lb (122 kg)    Physical Exam  Results for orders placed or performed in visit on 04/22/16  ToxASSURE Select 38 (MW), Urine  Result Value Ref Range   ToxAssure Select 13 FINAL       Assessment & Plan:   Problem List Items Addressed This Visit      Unprioritized   Benign hypertension   Relevant Orders   Comprehensive metabolic panel   COPD, severe ([place])    Stable for now.   Will need increase medication.  Will discuss next visit.        Edema    Refer to cardiology for further evaluation      Relevant Orders   Ambulatory referral to Cardiology    Other Visit Diagnoses    Bilateral edema of lower extremity    -  Primary   Relevant Orders   Ambulatory referral to Cardiology   Comprehensive metabolic panel   Ambulatory referral to Vascular Surgery   SOB (shortness of breath)       Relevant Orders   Ambulatory referral to Cardiology   Comprehensive metabolic panel      Pt with a great deal of leg edema.  Will start weaning of Gabapentin due to ? Side-effects and no help with pain. Titrate back to 400 mg TID.  Cardiology and vascular referral-  Follow up plan: Return in about 4 weeks (around 08/25/2016).

## 2016-07-28 NOTE — Assessment & Plan Note (Signed)
Stable for now.  Will need increase medication.  Will discuss next visit.

## 2016-07-28 NOTE — Assessment & Plan Note (Signed)
Refer to cardiology for further evaluation.

## 2016-07-29 ENCOUNTER — Telehealth: Payer: Self-pay

## 2016-07-29 ENCOUNTER — Encounter: Payer: Self-pay | Admitting: Family Medicine

## 2016-07-29 LAB — COMPREHENSIVE METABOLIC PANEL
ALT: 13 IU/L (ref 0–32)
AST: 19 IU/L (ref 0–40)
Albumin/Globulin Ratio: 1.3 (ref 1.2–2.2)
Albumin: 3.7 g/dL (ref 3.6–4.8)
Alkaline Phosphatase: 94 IU/L (ref 39–117)
BUN/Creatinine Ratio: 14 (ref 12–28)
BUN: 12 mg/dL (ref 8–27)
Bilirubin Total: <0.2 mg/dL (ref 0.0–1.2)
CO2: 25 mmol/L (ref 18–29)
Calcium: 9.2 mg/dL (ref 8.7–10.3)
Chloride: 102 mmol/L (ref 96–106)
Creatinine, Ser: 0.86 mg/dL (ref 0.57–1.00)
GFR calc Af Amer: 85 mL/min/{1.73_m2} (ref >59)
GFR calc non Af Amer: 74 mL/min/{1.73_m2} (ref >59)
Globulin, Total: 2.8 g/dL (ref 1.5–4.5)
Glucose: 115 mg/dL — ABNORMAL HIGH (ref 65–99)
Potassium: 4 mmol/L (ref 3.5–5.2)
Sodium: 143 mmol/L (ref 134–144)
Total Protein: 6.5 g/dL (ref 6.0–8.5)

## 2016-07-29 NOTE — Telephone Encounter (Signed)
Pt.notified

## 2016-07-29 NOTE — Telephone Encounter (Signed)
Called patient to notify her of her Cardiology appointment.  08/07/2016 at 11:45 with Dr. Ubaldo Glassing at [facility].  No answer. Left message for patient to return my call.

## 2016-08-05 ENCOUNTER — Telehealth: Payer: Self-pay | Admitting: *Deleted

## 2016-08-06 ENCOUNTER — Other Ambulatory Visit: Payer: Self-pay | Admitting: Family Medicine

## 2016-08-07 NOTE — Telephone Encounter (Signed)
fowarding to provider.

## 2016-08-10 ENCOUNTER — Encounter: Payer: BLUE CROSS/BLUE SHIELD | Admitting: Pain Medicine

## 2016-08-11 ENCOUNTER — Encounter: Payer: Self-pay | Admitting: Unknown Physician Specialty

## 2016-08-14 ENCOUNTER — Other Ambulatory Visit: Payer: Self-pay | Admitting: Unknown Physician Specialty

## 2016-08-14 ENCOUNTER — Other Ambulatory Visit: Payer: Self-pay | Admitting: Family Medicine

## 2016-08-14 NOTE — Telephone Encounter (Signed)
Your patient 

## 2016-08-17 NOTE — Telephone Encounter (Signed)
Routing to provider  

## 2016-08-20 ENCOUNTER — Encounter: Payer: BLUE CROSS/BLUE SHIELD | Admitting: Pain Medicine

## 2016-08-25 ENCOUNTER — Ambulatory Visit (INDEPENDENT_AMBULATORY_CARE_PROVIDER_SITE_OTHER): Payer: BLUE CROSS/BLUE SHIELD | Admitting: Unknown Physician Specialty

## 2016-08-25 ENCOUNTER — Encounter: Payer: Self-pay | Admitting: Unknown Physician Specialty

## 2016-08-25 VITALS — BP 127/72 | HR 97 | Temp 98.5°F | Ht 63.3 in | Wt 289.4 lb

## 2016-08-25 DIAGNOSIS — I10 Essential (primary) hypertension: Secondary | ICD-10-CM | POA: Diagnosis not present

## 2016-08-25 DIAGNOSIS — Z720 Tobacco use: Secondary | ICD-10-CM | POA: Diagnosis not present

## 2016-08-25 DIAGNOSIS — F419 Anxiety disorder, unspecified: Secondary | ICD-10-CM

## 2016-08-25 DIAGNOSIS — Z23 Encounter for immunization: Secondary | ICD-10-CM | POA: Diagnosis not present

## 2016-08-25 MED ORDER — CARISOPRODOL 350 MG PO TABS: ORAL_TABLET | ORAL | 0 refills | Status: DC

## 2016-08-25 MED ORDER — FUROSEMIDE 40 MG PO TABS: 20.0000 mg | ORAL_TABLET | Freq: Every day | ORAL | 3 refills | Status: DC

## 2016-08-25 NOTE — Assessment & Plan Note (Signed)
Work on quitting.  Will get a patch.  Low dose CT scan

## 2016-08-25 NOTE — Patient Instructions (Signed)

## 2016-08-25 NOTE — Assessment & Plan Note (Signed)
Decrease 40 mg Lasix to 20 mg and DC potassium as she was not taking that before.  Refill her other BP meds.

## 2016-08-25 NOTE — Progress Notes (Signed)
BP 127/72 (BP Location: Left Arm, Cuff Size: Large)   Pulse 97   Temp 98.5 F (36.9 C)   Ht 5' 3.3" (1.608 m) Comment: pt had shoes on  Wt 289 lb 6.4 oz (131.3 kg) Comment: pt had shoes on  LMP  (LMP Unknown)   SpO2 95%   BMI 50.78 kg/m    Subjective:    Patient ID: [name], female    DOB: 1956/02/13, 60 y.o.   MRN: [id number]  HPI: [name] is a 60 y.o. female  Chief Complaint  Patient presents with  . Leg Swelling    4 week f/up   Pt has been to vascular but told more fluid pills would not help. She is also seeing cardiology.  She is using compression devices for her legs.    Her pain doctor has left town and she will be out of medications soon.  She is now cutting her medication in 1/2 as she cannot travel to [place].    Relevant past medical, surgical, family and social history reviewed and updated as indicated. Interim medical history since our last visit reviewed. Allergies and medications reviewed and updated.  Review of Systems  Per HPI unless specifically indicated above     Objective:    BP 127/72 (BP Location: Left Arm, Cuff Size: Large)   Pulse 97   Temp 98.5 F (36.9 C)   Ht 5' 3.3" (1.608 m) Comment: pt had shoes on  Wt 289 lb 6.4 oz (131.3 kg) Comment: pt had shoes on  LMP  (LMP Unknown)   SpO2 95%   BMI 50.78 kg/m   Wt Readings from Last 3 Encounters:  08/25/16 289 lb 6.4 oz (131.3 kg)  07/20/16 265 lb (120.2 kg)  06/18/16 269 lb (122 kg)    Physical Exam  Constitutional: She is oriented to person, place, and time. She appears well-developed and well-nourished. No distress.  HENT:  Head: Normocephalic and atraumatic.  Eyes: Conjunctivae and lids are normal. Right eye exhibits no discharge. Left eye exhibits no discharge. No scleral icterus.  Neck: Normal range of motion. Neck supple. No JVD present. Carotid bruit is not present.  Cardiovascular: Normal rate, regular rhythm and normal heart sounds.   Pulmonary/Chest: Effort  normal and breath sounds normal.  Abdominal: Normal appearance. There is no splenomegaly or hepatomegaly.  Musculoskeletal: Normal range of motion.  Neurological: She is alert and oriented to person, place, and time.  Skin: Skin is warm, dry and intact. No rash noted. No pallor.  Psychiatric: She has a normal mood and affect. Her behavior is normal. Judgment and thought content normal.  Nursing note and vitals reviewed.   Results for orders placed or performed in visit on 07/28/16  Comprehensive metabolic panel  Result Value Ref Range   Glucose 115 (H) 65 - 99 mg/dL   BUN 12 8 - 27 mg/dL   Creatinine, Ser 0.86 0.57 - 1.00 mg/dL   GFR calc non Af Amer 74 >59 mL/min/1.73   GFR calc Af Amer 85 >59 mL/min/1.73   BUN/Creatinine Ratio 14 12 - 28   Sodium 143 134 - 144 mmol/L   Potassium 4.0 3.5 - 5.2 mmol/L   Chloride 102 96 - 106 mmol/L   CO2 25 18 - 29 mmol/L   Calcium 9.2 8.7 - 10.3 mg/dL   Total Protein 6.5 6.0 - 8.5 g/dL   Albumin 3.7 3.6 - 4.8 g/dL   Globulin, Total 2.8 1.5 - 4.5 g/dL  Albumin/Globulin Ratio 1.3 1.2 - 2.2   Bilirubin Total <0.2 0.0 - 1.2 mg/dL   Alkaline Phosphatase 94 39 - 117 IU/L   AST 19 0 - 40 IU/L   ALT 13 0 - 32 IU/L      Assessment & Plan:   Problem List Items Addressed This Visit      Unprioritized   Anxiety    Pt would like to increase Clonazepam prn as husband recently diagnosed with cancer.  We agreed this would be OK temporarily      Benign hypertension    Decrease 40 mg Lasix to 20 mg and DC potassium as she was not taking that before.  Refill her other BP meds.        Relevant Medications   furosemide (LASIX) 40 MG tablet   Tobacco abuse    Work on quitting.  Will get a patch.  Low dose CT scan       Other Visit Diagnoses    Need for influenza vaccination    -  Primary   Relevant Orders   Flu Vaccine QUAD 36+ mos IM (Completed)       Follow up plan: Return in about 3 months (around 11/24/2016).

## 2016-08-25 NOTE — Assessment & Plan Note (Addendum)
Pt would like to increase Clonazepam prn as husband recently diagnosed with cancer.  We agreed this would be OK temporarily

## 2016-08-26 ENCOUNTER — Telehealth: Payer: Self-pay | Admitting: *Deleted

## 2016-08-26 NOTE — Telephone Encounter (Signed)
Received referral for initial lung cancer screening scan. Contacted patient and obtained smoking history,(current, 46 pack year) as well as answering questions related to screening process. Patient denies signs of lung cancer such as weight loss or hemoptysis. Patient denies comorbidity that would prevent curative treatment if lung cancer were found. Patient is tentatively scheduled for shared decision making visit and CT scan on 09/01/16 at 1:30pm, pending insurance approval from business office.

## 2016-08-31 ENCOUNTER — Encounter: Payer: Self-pay | Admitting: *Deleted

## 2016-08-31 ENCOUNTER — Other Ambulatory Visit: Payer: Self-pay | Admitting: *Deleted

## 2016-08-31 DIAGNOSIS — Z87891 Personal history of nicotine dependence: Secondary | ICD-10-CM

## 2016-09-01 ENCOUNTER — Ambulatory Visit
Admission: RE | Admit: 2016-09-01 | Discharge: 2016-09-01 | Disposition: A | Discharge status: Home / Self Care | Payer: BLUE CROSS/BLUE SHIELD | Source: Ambulatory Visit | Attending: Oncology | Admitting: Oncology

## 2016-09-01 ENCOUNTER — Inpatient Hospital Stay: Payer: BLUE CROSS/BLUE SHIELD | Attending: Oncology | Admitting: Oncology

## 2016-09-01 DIAGNOSIS — I700 Atherosclerosis of aorta: Secondary | ICD-10-CM | POA: Insufficient documentation

## 2016-09-01 DIAGNOSIS — Z87891 Personal history of nicotine dependence: Secondary | ICD-10-CM

## 2016-09-01 DIAGNOSIS — D3501 Benign neoplasm of right adrenal gland: Secondary | ICD-10-CM | POA: Diagnosis not present

## 2016-09-01 DIAGNOSIS — I2510 Atherosclerotic heart disease of native coronary artery without angina pectoris: Secondary | ICD-10-CM | POA: Insufficient documentation

## 2016-09-01 DIAGNOSIS — Z122 Encounter for screening for malignant neoplasm of respiratory organs: Secondary | ICD-10-CM

## 2016-09-01 NOTE — Progress Notes (Signed)
In accordance with CMS guidelines, patient has met eligibility criteria including age, absence of signs or symptoms of lung cancer.  Social History  Substance Use Topics  . Smoking status: Current Every Day Smoker    Packs/day: 1.00    Years: 46.00    Types: Cigarettes  . Smokeless tobacco: Never Used  . Alcohol use No     A shared decision-making session was conducted prior to the performance of CT scan. This includes one or more decision aids, includes benefits and harms of screening, follow-up diagnostic testing, over-diagnosis, false positive rate, and total radiation exposure.  Counseling on the importance of adherence to annual lung cancer LDCT screening, impact of co-morbidities, and ability or willingness to undergo diagnosis and treatment is imperative for compliance of the program.  Counseling on the importance of continued smoking cessation for former smokers; the importance of smoking cessation for current smokers, and information about tobacco cessation interventions have been given to patient including [place] and [employer] quit [place] programs.  Written order for lung cancer screening with LDCT has been given to the patient and any and all questions have been answered to the best of my abilities.   Yearly follow up will be coordinated by Burgess Estelle, Thoracic Navigator.

## 2016-09-03 ENCOUNTER — Other Ambulatory Visit: Payer: Self-pay | Admitting: Family Medicine

## 2016-09-03 ENCOUNTER — Telehealth: Payer: Self-pay | Admitting: *Deleted

## 2016-09-03 NOTE — Telephone Encounter (Signed)
Your patient 

## 2016-09-03 NOTE — Telephone Encounter (Signed)
Notified patient of LDCT lung cancer screening results with recommendation for 12 month follow up imaging. Also notified of incidental finding noted below. Patient is encouraged to discuss incidental findings with PCP who will receive a copy of this note. Patient verbalizes recent cardiology tests and evaluation. Patient verbalizes understanding.   IMPRESSION: 1. Lung-RADS Category 2S, benign appearance or behavior. Continue annual screening with low-dose chest CT without contrast in 12 months. 2. The "S" modifier above refers to potentially clinically significant non lung cancer related findings. Specifically, there is aortic atherosclerosis, in addition to 2 vessel coronary artery disease. Please note that although the presence of coronary artery calcium documents the presence of coronary artery disease, the severity of this disease and any potential stenosis cannot be assessed on this non-gated CT examination. Assessment for potential risk factor modification, dietary therapy or pharmacologic therapy may be warranted, if clinically indicated. 3. 2.9 cm right adrenal adenoma is similar to prior examinations.

## 2016-09-08 ENCOUNTER — Other Ambulatory Visit: Payer: Self-pay | Admitting: Pain Medicine

## 2016-09-17 ENCOUNTER — Other Ambulatory Visit: Payer: Self-pay | Admitting: Unknown Physician Specialty

## 2016-09-18 ENCOUNTER — Telehealth: Payer: Self-pay | Admitting: Unknown Physician Specialty

## 2016-09-18 MED ORDER — HYDROXYZINE HCL 10 MG PO TABS: 10.0000 mg | ORAL_TABLET | Freq: Three times a day (TID) | ORAL | 0 refills | Status: DC | PRN

## 2016-09-18 NOTE — Telephone Encounter (Signed)
Discussed with pt that she should not be taking more than the Clonazepam prescribed.  She is going through a lot of stress.  Will call in Atarax and see if that helps.

## 2016-09-18 NOTE — Telephone Encounter (Signed)
Your patient 

## 2016-09-23 ENCOUNTER — Other Ambulatory Visit: Payer: Self-pay | Admitting: Unknown Physician Specialty

## 2016-09-30 ENCOUNTER — Other Ambulatory Visit: Payer: Self-pay | Admitting: Pain Medicine

## 2016-10-02 ENCOUNTER — Other Ambulatory Visit: Payer: Self-pay | Admitting: Unknown Physician Specialty

## 2016-10-05 ENCOUNTER — Other Ambulatory Visit: Payer: Self-pay | Admitting: Family Medicine

## 2016-10-09 ENCOUNTER — Encounter: Payer: Self-pay | Admitting: Unknown Physician Specialty

## 2016-10-13 ENCOUNTER — Other Ambulatory Visit: Payer: Self-pay

## 2016-10-13 MED ORDER — OMEPRAZOLE 40 MG PO CPDR: 40.0000 mg | DELAYED_RELEASE_CAPSULE | Freq: Every day | ORAL | 0 refills | Status: DC

## 2016-10-14 ENCOUNTER — Ambulatory Visit (INDEPENDENT_AMBULATORY_CARE_PROVIDER_SITE_OTHER): Payer: Self-pay | Admitting: Vascular Surgery

## 2016-10-14 ENCOUNTER — Encounter (INDEPENDENT_AMBULATORY_CARE_PROVIDER_SITE_OTHER): Payer: Self-pay

## 2016-10-23 ENCOUNTER — Other Ambulatory Visit: Payer: Self-pay | Admitting: Unknown Physician Specialty

## 2016-10-25 ENCOUNTER — Other Ambulatory Visit: Payer: Self-pay | Admitting: Unknown Physician Specialty

## 2016-10-29 ENCOUNTER — Other Ambulatory Visit: Payer: Self-pay | Admitting: Unknown Physician Specialty

## 2016-11-06 ENCOUNTER — Other Ambulatory Visit: Payer: Self-pay | Admitting: Unknown Physician Specialty

## 2016-11-10 ENCOUNTER — Other Ambulatory Visit: Payer: Self-pay | Admitting: Unknown Physician Specialty

## 2016-11-11 ENCOUNTER — Encounter (INDEPENDENT_AMBULATORY_CARE_PROVIDER_SITE_OTHER): Payer: BLUE CROSS/BLUE SHIELD

## 2016-11-11 ENCOUNTER — Ambulatory Visit (INDEPENDENT_AMBULATORY_CARE_PROVIDER_SITE_OTHER): Payer: Self-pay | Admitting: Vascular Surgery

## 2016-11-24 ENCOUNTER — Other Ambulatory Visit: Payer: Self-pay | Admitting: Unknown Physician Specialty

## 2016-11-24 ENCOUNTER — Ambulatory Visit: Payer: BLUE CROSS/BLUE SHIELD | Admitting: Unknown Physician Specialty

## 2016-11-24 NOTE — Telephone Encounter (Signed)
Routing to provider  

## 2016-11-25 MED ORDER — HYDROXYZINE HCL 10 MG PO TABS: 10.0000 mg | ORAL_TABLET | Freq: Three times a day (TID) | ORAL | 0 refills | Status: DC | PRN

## 2016-11-26 ENCOUNTER — Other Ambulatory Visit: Payer: Self-pay | Admitting: Unknown Physician Specialty

## 2016-12-01 ENCOUNTER — Encounter: Payer: Self-pay | Admitting: Unknown Physician Specialty

## 2016-12-01 ENCOUNTER — Ambulatory Visit (INDEPENDENT_AMBULATORY_CARE_PROVIDER_SITE_OTHER): Payer: BLUE CROSS/BLUE SHIELD | Admitting: Unknown Physician Specialty

## 2016-12-01 VITALS — BP 134/79 | HR 93 | Temp 98.6°F | Wt 267.6 lb

## 2016-12-01 DIAGNOSIS — I10 Essential (primary) hypertension: Secondary | ICD-10-CM

## 2016-12-01 DIAGNOSIS — E782 Mixed hyperlipidemia: Secondary | ICD-10-CM | POA: Diagnosis not present

## 2016-12-01 DIAGNOSIS — Z720 Tobacco use: Secondary | ICD-10-CM | POA: Diagnosis not present

## 2016-12-01 DIAGNOSIS — F329 Major depressive disorder, single episode, unspecified: Secondary | ICD-10-CM

## 2016-12-01 DIAGNOSIS — R7301 Impaired fasting glucose: Secondary | ICD-10-CM

## 2016-12-01 LAB — LIPID PANEL PICCOLO, WAIVED
Chol/HDL Ratio Piccolo,Waive: 3.4 mg/dL
Cholesterol Piccolo, Waived: 186 mg/dL (ref <200)
HDL Chol Piccolo, Waived: 54 mg/dL — ABNORMAL LOW (ref >59)
LDL Chol Calc Piccolo Waived: 97 mg/dL (ref <100)
Triglycerides Piccolo,Waived: 177 mg/dL — ABNORMAL HIGH (ref <150)
VLDL Chol Calc Piccolo,Waive: 35 mg/dL — ABNORMAL HIGH (ref <30)

## 2016-12-01 LAB — BAYER DCA HB A1C WAIVED: HB A1C (BAYER DCA - WAIVED): 5.9 % (ref <7.0)

## 2016-12-01 NOTE — Assessment & Plan Note (Signed)
Encouraged to quit. 

## 2016-12-01 NOTE — Assessment & Plan Note (Signed)
LDL is 97.  Continue present medications

## 2016-12-01 NOTE — Assessment & Plan Note (Signed)
Stable, continue present medications.   

## 2016-12-01 NOTE — Progress Notes (Signed)
BP 134/79 (BP Location: Left Arm, Cuff Size: Large)   Pulse 93   Temp 98.6 F (37 C)   Wt 267 lb 9.6 oz (121.4 kg)   LMP  (LMP Unknown)   SpO2 95%   BMI 47.40 kg/m    Subjective:    Patient ID: [name], female    DOB: 23-Aug-1956, 61 y.o.   MRN: [id number]  HPI: [name] is a 61 y.o. female  Chief Complaint  Patient presents with  . Diabetes  . Hyperlipidemia  . Hypertension  . Depression   Diabetes: IFG.  Not taking any medications.  Lost 13 pounds in the last month with diet changes.    Hypertension  Using medications without difficulty Average home BPs "good"   Using medication without problems or lightheadedness No chest pain with exertion or shortness of breath No Edema: seems to resolve with compression stockings  Elevated Cholesterol Using medications without problems No Muscle aches  Diet: "not eating"  COPD Smokes 1 ppd.  Net ready to quit  Depression This is stable.  Not taking Clonazepam.  Taking Hydroxyzine Depression screen [facility] 2/9 12/01/2016 06/10/2016 05/20/2016 01/28/2016 12/30/2015  Decreased Interest 1 0 0 1 0  Down, Depressed, Hopeless 1 0 0 0 0  PHQ - 2 Score 2 0 0 1 0  Altered sleeping 2 - - - -  Tired, decreased energy 1 - - - -  Change in appetite 0 - - - -  Feeling bad or failure about yourself  0 - - - -  Trouble concentrating 1 - - - -  Moving slowly or fidgety/restless 0 - - - -  Suicidal thoughts 0 - - - -  PHQ-9 Score 6 - - - -     Relevant past medical, surgical, family and social history reviewed and updated as indicated. Interim medical history since our last visit reviewed. Allergies and medications reviewed and updated.  Review of Systems  Per HPI unless specifically indicated above     Objective:    BP 134/79 (BP Location: Left Arm, Cuff Size: Large)   Pulse 93   Temp 98.6 F (37 C)   Wt 267 lb 9.6 oz (121.4 kg)   LMP  (LMP Unknown)   SpO2 95%   BMI 47.40 kg/m   Wt Readings from Last 3  Encounters:  12/01/16 267 lb 9.6 oz (121.4 kg)  09/01/16 290 lb (131.5 kg)  08/25/16 289 lb 6.4 oz (131.3 kg)    Physical Exam  Constitutional: She is oriented to person, place, and time. She appears well-developed and well-nourished. No distress.  HENT:  Head: Normocephalic and atraumatic.  Eyes: Conjunctivae and lids are normal. Right eye exhibits no discharge. Left eye exhibits no discharge. No scleral icterus.  Neck: Normal range of motion. Neck supple. No JVD present. Carotid bruit is not present.  Cardiovascular: Normal rate, regular rhythm and normal heart sounds.   Pulmonary/Chest: Effort normal and breath sounds normal.  Abdominal: Normal appearance. There is no splenomegaly or hepatomegaly.  Musculoskeletal: Normal range of motion.  Neurological: She is alert and oriented to person, place, and time.  Skin: Skin is warm, dry and intact. No rash noted. No pallor.  Psychiatric: She has a normal mood and affect. Her behavior is normal. Judgment and thought content normal.    Results for orders placed or performed in visit on 07/28/16  Comprehensive metabolic panel  Result Value Ref Range   Glucose 115 (H) 65 - 99 mg/dL  BUN 12 8 - 27 mg/dL   Creatinine, Ser 0.86 0.57 - 1.00 mg/dL   GFR calc non Af Amer 74 >59 mL/min/1.73   GFR calc Af Amer 85 >59 mL/min/1.73   BUN/Creatinine Ratio 14 12 - 28   Sodium 143 134 - 144 mmol/L   Potassium 4.0 3.5 - 5.2 mmol/L   Chloride 102 96 - 106 mmol/L   CO2 25 18 - 29 mmol/L   Calcium 9.2 8.7 - 10.3 mg/dL   Total Protein 6.5 6.0 - 8.5 g/dL   Albumin 3.7 3.6 - 4.8 g/dL   Globulin, Total 2.8 1.5 - 4.5 g/dL   Albumin/Globulin Ratio 1.3 1.2 - 2.2   Bilirubin Total <0.2 0.0 - 1.2 mg/dL   Alkaline Phosphatase 94 39 - 117 IU/L   AST 19 0 - 40 IU/L   ALT 13 0 - 32 IU/L      Assessment & Plan:   Problem List Items Addressed This Visit      Unprioritized   Benign hypertension    Stable, continue present medications.        Essential  hypertension   Relevant Orders   Comprehensive metabolic panel   Hyperlipidemia    LDL is 97.  Continue present medications      Relevant Orders   Lipid Panel Piccolo, Waived   Major depression, chronic - Primary   Tobacco abuse    Encouraged to quit       Other Visit Diagnoses    IFG (impaired fasting glucose)       Hgb A1C is 5.9   Relevant Orders   Bayer DCA Hb A1c Waived       Follow up plan: Return in about 6 months (around 05/31/2017).

## 2016-12-02 LAB — COMPREHENSIVE METABOLIC PANEL
ALT: 14 IU/L (ref 0–32)
AST: 16 IU/L (ref 0–40)
Albumin/Globulin Ratio: 1.7 (ref 1.2–2.2)
Albumin: 4.2 g/dL (ref 3.6–4.8)
Alkaline Phosphatase: 79 IU/L (ref 39–117)
BUN/Creatinine Ratio: 10 — ABNORMAL LOW (ref 12–28)
BUN: 8 mg/dL (ref 8–27)
Bilirubin Total: 0.3 mg/dL (ref 0.0–1.2)
CO2: 25 mmol/L (ref 18–29)
Calcium: 9.6 mg/dL (ref 8.7–10.3)
Chloride: 102 mmol/L (ref 96–106)
Creatinine, Ser: 0.83 mg/dL (ref 0.57–1.00)
GFR calc Af Amer: 89 mL/min/{1.73_m2} (ref >59)
GFR calc non Af Amer: 77 mL/min/{1.73_m2} (ref >59)
Globulin, Total: 2.5 g/dL (ref 1.5–4.5)
Glucose: 96 mg/dL (ref 65–99)
Potassium: 4.2 mmol/L (ref 3.5–5.2)
Sodium: 144 mmol/L (ref 134–144)
Total Protein: 6.7 g/dL (ref 6.0–8.5)

## 2016-12-03 ENCOUNTER — Ambulatory Visit (INDEPENDENT_AMBULATORY_CARE_PROVIDER_SITE_OTHER): Payer: BLUE CROSS/BLUE SHIELD | Admitting: Vascular Surgery

## 2016-12-03 ENCOUNTER — Encounter (INDEPENDENT_AMBULATORY_CARE_PROVIDER_SITE_OTHER): Payer: BLUE CROSS/BLUE SHIELD

## 2016-12-20 ENCOUNTER — Other Ambulatory Visit: Payer: Self-pay | Admitting: Family Medicine

## 2016-12-21 NOTE — Telephone Encounter (Signed)
Your patient 

## 2016-12-22 ENCOUNTER — Other Ambulatory Visit: Payer: Self-pay | Admitting: Unknown Physician Specialty

## 2017-01-05 ENCOUNTER — Other Ambulatory Visit: Payer: Self-pay | Admitting: Unknown Physician Specialty

## 2017-01-11 ENCOUNTER — Telehealth: Payer: Self-pay

## 2017-01-11 NOTE — Telephone Encounter (Signed)
Patient returned call. She stated that her insurance has not changed. Patient states that [place] is her primary but also has Medicaid. Patient stated that she received something from [facility] stating that they are the ones not wanting to cover this medication. Will have to give PA to [facility] as she handle Medicaid PA's.

## 2017-01-11 NOTE — Telephone Encounter (Signed)
Called and left patient a VM asking for her to please return my call. I received a PA request for her valsartan/hctz and wanted to see if her insurance changed before submitting the PA.

## 2017-01-13 ENCOUNTER — Other Ambulatory Visit: Payer: Self-pay | Admitting: Unknown Physician Specialty

## 2017-01-21 ENCOUNTER — Other Ambulatory Visit: Payer: Self-pay | Admitting: Unknown Physician Specialty

## 2017-01-29 ENCOUNTER — Other Ambulatory Visit: Payer: Self-pay | Admitting: Family Medicine

## 2017-03-03 ENCOUNTER — Other Ambulatory Visit: Payer: Self-pay | Admitting: Family Medicine

## 2017-03-03 ENCOUNTER — Other Ambulatory Visit: Payer: Self-pay | Admitting: Unknown Physician Specialty

## 2017-03-04 ENCOUNTER — Encounter: Payer: Self-pay | Admitting: Unknown Physician Specialty

## 2017-03-04 NOTE — Telephone Encounter (Signed)
Your patient 

## 2017-03-05 MED ORDER — POTASSIUM CHLORIDE CRYS ER 20 MEQ PO TBCR: 20.0000 meq | EXTENDED_RELEASE_TABLET | Freq: Every day | ORAL | 0 refills | Status: DC

## 2017-03-05 MED ORDER — DULOXETINE HCL 60 MG PO CPEP: 60.0000 mg | ORAL_CAPSULE | Freq: Every day | ORAL | 0 refills | Status: DC

## 2017-03-05 MED ORDER — OMEPRAZOLE 40 MG PO CPDR: 40.0000 mg | DELAYED_RELEASE_CAPSULE | Freq: Every day | ORAL | 3 refills | Status: DC

## 2017-03-15 ENCOUNTER — Other Ambulatory Visit: Payer: Self-pay | Admitting: Unknown Physician Specialty

## 2017-03-15 MED ORDER — BUPROPION HCL ER (XL) 150 MG PO TB24: 150.0000 mg | ORAL_TABLET | Freq: Every day | ORAL | 0 refills | Status: DC

## 2017-03-15 MED ORDER — GABAPENTIN 800 MG PO TABS: 800.0000 mg | ORAL_TABLET | Freq: Three times a day (TID) | ORAL | 0 refills | Status: DC

## 2017-03-15 MED ORDER — TIOTROPIUM BROMIDE MONOHYDRATE 18 MCG IN CAPS: 18.0000 ug | ORAL_CAPSULE | Freq: Every day | RESPIRATORY_TRACT | 3 refills | Status: DC

## 2017-03-15 MED ORDER — BUSPIRONE HCL 10 MG PO TABS: 10.0000 mg | ORAL_TABLET | Freq: Two times a day (BID) | ORAL | 0 refills | Status: DC

## 2017-03-18 ENCOUNTER — Other Ambulatory Visit: Payer: Self-pay | Admitting: Unknown Physician Specialty

## 2017-03-19 MED ORDER — POTASSIUM CHLORIDE CRYS ER 20 MEQ PO TBCR: 20.0000 meq | EXTENDED_RELEASE_TABLET | Freq: Every day | ORAL | 3 refills | Status: DC

## 2017-04-02 ENCOUNTER — Other Ambulatory Visit: Payer: Self-pay | Admitting: Unknown Physician Specialty

## 2017-04-09 ENCOUNTER — Other Ambulatory Visit: Payer: Self-pay | Admitting: Unknown Physician Specialty

## 2017-04-09 MED ORDER — VALSARTAN-HYDROCHLOROTHIAZIDE 160-12.5 MG PO TABS: 1.0000 | ORAL_TABLET | Freq: Every day | ORAL | 0 refills | Status: DC

## 2017-04-09 NOTE — Telephone Encounter (Signed)
Routing to provider. Appt on 05/31/17.

## 2017-05-06 ENCOUNTER — Encounter: Payer: Self-pay | Admitting: Unknown Physician Specialty

## 2017-05-10 ENCOUNTER — Other Ambulatory Visit: Payer: Self-pay | Admitting: Unknown Physician Specialty

## 2017-05-11 ENCOUNTER — Other Ambulatory Visit: Payer: Self-pay | Admitting: Unknown Physician Specialty

## 2017-05-11 MED ORDER — HYDROXYZINE HCL 10 MG PO TABS: 10.0000 mg | ORAL_TABLET | Freq: Three times a day (TID) | ORAL | 0 refills | Status: DC | PRN

## 2017-05-25 ENCOUNTER — Encounter: Payer: Self-pay | Admitting: Unknown Physician Specialty

## 2017-05-25 MED ORDER — BENZONATATE 200 MG PO CAPS: 200.0000 mg | ORAL_CAPSULE | Freq: Two times a day (BID) | ORAL | 0 refills | Status: DC | PRN

## 2017-05-28 ENCOUNTER — Encounter: Payer: Self-pay | Admitting: Unknown Physician Specialty

## 2017-05-31 ENCOUNTER — Ambulatory Visit: Payer: BLUE CROSS/BLUE SHIELD | Admitting: Unknown Physician Specialty

## 2017-06-03 ENCOUNTER — Other Ambulatory Visit: Payer: Self-pay | Admitting: Unknown Physician Specialty

## 2017-06-07 ENCOUNTER — Ambulatory Visit (INDEPENDENT_AMBULATORY_CARE_PROVIDER_SITE_OTHER): Payer: Self-pay | Admitting: Unknown Physician Specialty

## 2017-06-07 ENCOUNTER — Encounter: Payer: Self-pay | Admitting: Unknown Physician Specialty

## 2017-06-07 DIAGNOSIS — F329 Major depressive disorder, single episode, unspecified: Secondary | ICD-10-CM

## 2017-06-07 DIAGNOSIS — J449 Chronic obstructive pulmonary disease, unspecified: Secondary | ICD-10-CM

## 2017-06-07 DIAGNOSIS — Z7189 Other specified counseling: Secondary | ICD-10-CM

## 2017-06-07 DIAGNOSIS — M48062 Spinal stenosis, lumbar region with neurogenic claudication: Secondary | ICD-10-CM

## 2017-06-07 DIAGNOSIS — F32A Depression, unspecified: Secondary | ICD-10-CM

## 2017-06-07 DIAGNOSIS — Z720 Tobacco use: Secondary | ICD-10-CM

## 2017-06-07 DIAGNOSIS — I10 Essential (primary) hypertension: Secondary | ICD-10-CM

## 2017-06-07 MED ORDER — LOVASTATIN 40 MG PO TABS: 40.0000 mg | ORAL_TABLET | Freq: Every day | ORAL | 1 refills | Status: DC

## 2017-06-07 MED ORDER — BUPROPION HCL ER (XL) 150 MG PO TB24: 300.0000 mg | ORAL_TABLET | Freq: Every day | ORAL | 5 refills | Status: DC

## 2017-06-07 MED ORDER — POLYMYXIN B-TRIMETHOPRIM 10000-0.1 UNIT/ML-% OP SOLN: 1.0000 [drp] | OPHTHALMIC | 0 refills | Status: DC

## 2017-06-07 MED ORDER — AMOXICILLIN 875 MG PO TABS: 875.0000 mg | ORAL_TABLET | Freq: Two times a day (BID) | ORAL | 0 refills | Status: DC

## 2017-06-07 MED ORDER — CARISOPRODOL 350 MG PO TABS: ORAL_TABLET | ORAL | 0 refills | Status: DC

## 2017-06-07 MED ORDER — TIOTROPIUM BROMIDE MONOHYDRATE 18 MCG IN CAPS: 18.0000 ug | ORAL_CAPSULE | Freq: Every day | RESPIRATORY_TRACT | 3 refills | Status: DC

## 2017-06-07 NOTE — Assessment & Plan Note (Signed)
Not interested in quitting smoking

## 2017-06-07 NOTE — Assessment & Plan Note (Signed)
Increase Wellbutrin to twice a day

## 2017-06-07 NOTE — Assessment & Plan Note (Signed)
Stable, continue present medications.   

## 2017-06-07 NOTE — Assessment & Plan Note (Signed)
Rx for [employer].  Discussed pain clinic

## 2017-06-07 NOTE — Assessment & Plan Note (Signed)
A voluntary discussion about advance care planning including the explanation and discussion of advance directives was extensively discussed  with the patient.  Explanation about the health care proxy and Living will was reviewed and packet with forms with explanation of how to fill them out was given.  During this discussion, the patient was able to identify a health care proxy as her son and plans to fill out the paperwork required.  Patient was offered a separate [facility] visit for further assistance with forms.

## 2017-06-07 NOTE — Progress Notes (Addendum)
BP 119/73 (BP Location: Left Arm, Cuff Size: Large)   Pulse 87   Temp 98.7 F (37.1 C)   Ht 5' 3.3" (1.608 m)   Wt 265 lb 6.4 oz (120.4 kg)   LMP  (LMP Unknown)   SpO2 93%   BMI 46.57 kg/m    Subjective:    Patient ID: [name], female    DOB: 1955-12-17, 61 y.o.   MRN: [id number]  HPI: [name] is a 61 y.o. female  Chief Complaint  Patient presents with  . Depression  . Hypertension  . Pain  . Eye Problem    pt states that she has been having watery eyes and "goupy" stuff in them for about a week    Depression Taking Wellbutrin.  Worsening due to complications of grief Depression screen [facility] 2/9 06/07/2017 12/01/2016 06/10/2016 05/20/2016 01/28/2016  Decreased Interest 1 1 0 0 1  Down, Depressed, Hopeless 1 1 0 0 0  PHQ - 2 Score 2 2 0 0 1  Altered sleeping - 2 - - -  Tired, decreased energy - 1 - - -  Change in appetite - 0 - - -  Feeling bad or failure about yourself  - 0 - - -  Trouble concentrating - 1 - - -  Moving slowly or fidgety/restless - 0 - - -  Suicidal thoughts - 0 - - -  PHQ-9 Score - 6 - - -   Pain Was going to the pain clinic but has not been going.  She is taking 4 Ibuprofen/day.  She would like to have a muscle relaxant.  She has been on Soma in the past.  She understands she can't get a narcotic at this time.    Hypertension Using medications without difficulty Average home BPs   No problems or lightheadedness No chest pain with exertion or shortness of breath No Edema  Tobacco 1 pack/day  COPD Taking daily Spireva.  Rare Proair use.    Eye Problem   Both eyes are affected.The problem has been gradually worsening. There is no known exposure to pink eye. Associated symptoms include an eye discharge, eye redness, itching and a recent URI.  Sinusitis  This is a new problem. The current episode started in the past 7 days. There has been no fever. She is experiencing no pain. Associated symptoms include congestion, ear pain, a hoarse  voice and sinus pressure. Past treatments include nothing. The treatment provided no relief.    Relevant past medical, surgical, family and social history reviewed and updated as indicated. Interim medical history since our last visit reviewed. Allergies and medications reviewed and updated.  Review of Systems  HENT: Positive for congestion, ear pain, hoarse voice and sinus pressure.   Eyes: Positive for discharge, redness and itching.    Per HPI unless specifically indicated above     Objective:    BP 119/73 (BP Location: Left Arm, Cuff Size: Large)   Pulse 87   Temp 98.7 F (37.1 C)   Ht 5' 3.3" (1.608 m)   Wt 265 lb 6.4 oz (120.4 kg)   LMP  (LMP Unknown)   SpO2 93%   BMI 46.57 kg/m   Wt Readings from Last 3 Encounters:  06/07/17 265 lb 6.4 oz (120.4 kg)  12/01/16 267 lb 9.6 oz (121.4 kg)  09/01/16 290 lb (131.5 kg)    Physical Exam  Constitutional: She is oriented to person, place, and time. She appears well-developed and well-nourished. No distress.  HENT:  Head: Normocephalic and atraumatic.  Right Ear: Tympanic membrane and ear canal normal.  Left Ear: Tympanic membrane and ear canal normal.  Nose: No rhinorrhea. Right sinus exhibits maxillary sinus tenderness. Right sinus exhibits no frontal sinus tenderness. Left sinus exhibits maxillary sinus tenderness. Left sinus exhibits no frontal sinus tenderness.  Eyes: Right eye exhibits discharge and exudate. Left eye exhibits discharge and exudate. No scleral icterus.  Bilateral injection  Neck: Normal range of motion. Neck supple. No JVD present. Carotid bruit is not present.  Cardiovascular: Normal rate, regular rhythm and normal heart sounds.   Pulmonary/Chest: Effort normal and breath sounds normal. No respiratory distress.  Abdominal: Normal appearance. There is no splenomegaly or hepatomegaly.  Musculoskeletal: Normal range of motion.  Neurological: She is alert and oriented to person, place, and time.  Skin: Skin  is warm, dry and intact. No rash noted. No pallor.  Psychiatric: She has a normal mood and affect. Her behavior is normal. Judgment and thought content normal.    Results for orders placed or performed in visit on 12/01/16  Bayer DCA Hb A1c Waived  Result Value Ref Range   Bayer DCA Hb A1c Waived 5.9 <7.0 %  Lipid Panel Piccolo, Waived  Result Value Ref Range   Cholesterol Piccolo, Waived 186 <200 mg/dL   HDL Chol Piccolo, Waived 54 (L) >59 mg/dL   Triglycerides Piccolo,Waived 177 (H) <150 mg/dL   Chol/HDL Ratio Piccolo,Waive 3.4 mg/dL   LDL Chol Calc Piccolo Waived 97 <100 mg/dL   VLDL Chol Calc Piccolo,Waive 35 (H) <30 mg/dL  Comprehensive metabolic panel  Result Value Ref Range   Glucose 96 65 - 99 mg/dL   BUN 8 8 - 27 mg/dL   Creatinine, Ser 0.83 0.57 - 1.00 mg/dL   GFR calc non Af Amer 77 >59 mL/min/1.73   GFR calc Af Amer 89 >59 mL/min/1.73   BUN/Creatinine Ratio 10 (L) 12 - 28   Sodium 144 134 - 144 mmol/L   Potassium 4.2 3.5 - 5.2 mmol/L   Chloride 102 96 - 106 mmol/L   CO2 25 18 - 29 mmol/L   Calcium 9.6 8.7 - 10.3 mg/dL   Total Protein 6.7 6.0 - 8.5 g/dL   Albumin 4.2 3.6 - 4.8 g/dL   Globulin, Total 2.5 1.5 - 4.5 g/dL   Albumin/Globulin Ratio 1.7 1.2 - 2.2   Bilirubin Total 0.3 0.0 - 1.2 mg/dL   Alkaline Phosphatase 79 39 - 117 IU/L   AST 16 0 - 40 IU/L   ALT 14 0 - 32 IU/L      Assessment & Plan:   Problem List Items Addressed This Visit      Unprioritized   Advanced care planning/counseling discussion    A voluntary discussion about advance care planning including the explanation and discussion of advance directives was extensively discussed  with the patient.  Explanation about the health care proxy and Living will was reviewed and packet with forms with explanation of how to fill them out was given.  During this discussion, the patient was able to identify a health care proxy as her son and plans to fill out the paperwork required.  Patient was offered a  separate [facility] visit for further assistance with forms.         COPD, severe ([place])    Stable, continue present medications.        Relevant Medications   tiotropium (SPIRIVA HANDIHALER) 18 MCG inhalation capsule   Essential hypertension  Stable, continue present medications.        Relevant Medications   lovastatin (MEVACOR) 40 MG tablet   Mood disorder of depressed type    Increase Wellbutrin to twice a day      Relevant Medications   buPROPion (WELLBUTRIN XL) 150 MG 24 hr tablet   Spinal stenosis, lumbar region, with neurogenic claudication    Rx for Soma.  Discussed pain clinic      Tobacco abuse    Not interested in quitting smoking          Follow up plan: Return in about 3 months (around 09/07/2017).

## 2017-06-08 ENCOUNTER — Telehealth: Payer: Self-pay | Admitting: Unknown Physician Specialty

## 2017-06-08 NOTE — Telephone Encounter (Signed)
Called and spoke to [place] at [employer]. She asked if we could fax over something from out office requesting to d/c patient's oxygen. Will type an order, have Malachy Mood sign, and fax to them at [phone].

## 2017-06-08 NOTE — Telephone Encounter (Signed)
Kathleen Mood, do you know what the patient is talking about or what I need to do? I did not see anything in the OV note regarding this.

## 2017-06-08 NOTE — Telephone Encounter (Signed)
Patient wanted to call back to provide the name and number of company that can pick up the oxygen tanks.  Patient states provider approved for oxygen to be picked up and needed to be provider name of company picking up oxygen. [employer]: [phone]  Please Advise.  Thank you

## 2017-06-08 NOTE — Telephone Encounter (Signed)
Yes, she has O2 at home which she doesn't need and she says a doctor needs to ask them to pick it up

## 2017-06-09 ENCOUNTER — Other Ambulatory Visit: Payer: Self-pay | Admitting: Unknown Physician Specialty

## 2017-06-09 NOTE — Telephone Encounter (Signed)
Order faxed to [facility]. Called and left patient a VM letting her know that this was done for her. Asked for her to call with any questions or concerns.

## 2017-06-13 ENCOUNTER — Other Ambulatory Visit: Payer: Self-pay | Admitting: Unknown Physician Specialty

## 2017-06-14 ENCOUNTER — Other Ambulatory Visit: Payer: Self-pay

## 2017-06-14 MED ORDER — HYDROXYZINE HCL 10 MG PO TABS: 10.0000 mg | ORAL_TABLET | Freq: Three times a day (TID) | ORAL | 0 refills | Status: DC | PRN

## 2017-06-17 ENCOUNTER — Other Ambulatory Visit: Payer: Self-pay | Admitting: Unknown Physician Specialty

## 2017-06-17 MED ORDER — VALSARTAN-HYDROCHLOROTHIAZIDE 160-12.5 MG PO TABS: 1.0000 | ORAL_TABLET | Freq: Every day | ORAL | 0 refills | Status: DC

## 2017-06-20 ENCOUNTER — Encounter: Payer: Self-pay | Admitting: Unknown Physician Specialty

## 2017-06-21 MED ORDER — TELMISARTAN-HCTZ 40-12.5 MG PO TABS: 1.0000 | ORAL_TABLET | Freq: Every day | ORAL | 6 refills | Status: DC

## 2017-06-21 NOTE — Telephone Encounter (Signed)
Patient's son stopped by to pick up sample for patient.

## 2017-06-29 ENCOUNTER — Other Ambulatory Visit: Payer: Self-pay | Admitting: Unknown Physician Specialty

## 2017-06-29 MED ORDER — BUSPIRONE HCL 10 MG PO TABS: 10.0000 mg | ORAL_TABLET | Freq: Two times a day (BID) | ORAL | 0 refills | Status: DC

## 2017-06-29 MED ORDER — GABAPENTIN 800 MG PO TABS: 800.0000 mg | ORAL_TABLET | Freq: Three times a day (TID) | ORAL | 0 refills | Status: DC

## 2017-07-05 ENCOUNTER — Other Ambulatory Visit: Payer: Self-pay | Admitting: Unknown Physician Specialty

## 2017-07-06 MED ORDER — CARISOPRODOL 350 MG PO TABS: ORAL_TABLET | ORAL | 0 refills | Status: DC

## 2017-07-13 ENCOUNTER — Other Ambulatory Visit: Payer: Self-pay | Admitting: Unknown Physician Specialty

## 2017-07-13 MED ORDER — HYDROXYZINE HCL 10 MG PO TABS: 10.0000 mg | ORAL_TABLET | Freq: Three times a day (TID) | ORAL | 0 refills | Status: DC | PRN

## 2017-07-14 ENCOUNTER — Other Ambulatory Visit: Payer: Self-pay | Admitting: Unknown Physician Specialty

## 2017-07-30 ENCOUNTER — Other Ambulatory Visit: Payer: Self-pay | Admitting: Unknown Physician Specialty

## 2017-07-30 ENCOUNTER — Telehealth: Payer: Self-pay | Admitting: Unknown Physician Specialty

## 2017-07-30 NOTE — Telephone Encounter (Signed)
Faxed script for patients carisoprodol to [facility]

## 2017-07-31 ENCOUNTER — Other Ambulatory Visit: Payer: Self-pay | Admitting: Unknown Physician Specialty

## 2017-08-03 ENCOUNTER — Other Ambulatory Visit: Payer: Self-pay

## 2017-08-03 MED ORDER — DULOXETINE HCL 60 MG PO CPEP: 60.0000 mg | ORAL_CAPSULE | Freq: Every day | ORAL | 3 refills | Status: DC

## 2017-08-03 NOTE — Telephone Encounter (Signed)
Patient sent in a message requesting a new prescription for duloxetine, last one was [facility]. Patient last seen 06/07/17 and has appointment 09/07/17.

## 2017-08-07 ENCOUNTER — Other Ambulatory Visit: Payer: Self-pay | Admitting: Unknown Physician Specialty

## 2017-08-30 ENCOUNTER — Other Ambulatory Visit: Payer: Self-pay | Admitting: Family Medicine

## 2017-08-30 ENCOUNTER — Other Ambulatory Visit: Payer: Self-pay | Admitting: Unknown Physician Specialty

## 2017-08-31 ENCOUNTER — Other Ambulatory Visit: Payer: Self-pay | Admitting: Family Medicine

## 2017-08-31 NOTE — Telephone Encounter (Signed)
Your patient 

## 2017-09-06 ENCOUNTER — Telehealth: Payer: Self-pay | Admitting: *Deleted

## 2017-09-06 DIAGNOSIS — Z122 Encounter for screening for malignant neoplasm of respiratory organs: Secondary | ICD-10-CM

## 2017-09-06 DIAGNOSIS — Z87891 Personal history of nicotine dependence: Secondary | ICD-10-CM

## 2017-09-06 NOTE — Telephone Encounter (Signed)
Left message for patient to notify them that it is time to schedule annual low dose lung cancer screening CT scan. Instructed patient to call back to verify information prior to the scan being scheduled.  

## 2017-09-06 NOTE — Telephone Encounter (Signed)
Notified patient that annual lung cancer screening low dose CT scan is due currently or will be in near future. Confirmed that patient is within the age range of 55-77, and asymptomatic, (no signs or symptoms of lung cancer). Patient denies illness that would prevent curative treatment for lung cancer if found. Verified smoking history, (current 47 pack year). The shared decision making visit was done 09/01/16. Patient is agreeable for CT scan being scheduled.

## 2017-09-07 ENCOUNTER — Ambulatory Visit (INDEPENDENT_AMBULATORY_CARE_PROVIDER_SITE_OTHER): Payer: BLUE CROSS/BLUE SHIELD | Admitting: Unknown Physician Specialty

## 2017-09-07 ENCOUNTER — Encounter: Payer: Self-pay | Admitting: Unknown Physician Specialty

## 2017-09-07 VITALS — BP 113/67 | HR 90 | Temp 98.7°F | Wt 271.4 lb

## 2017-09-07 DIAGNOSIS — Z23 Encounter for immunization: Secondary | ICD-10-CM | POA: Diagnosis not present

## 2017-09-07 DIAGNOSIS — F419 Anxiety disorder, unspecified: Secondary | ICD-10-CM | POA: Diagnosis not present

## 2017-09-07 DIAGNOSIS — J449 Chronic obstructive pulmonary disease, unspecified: Secondary | ICD-10-CM | POA: Diagnosis not present

## 2017-09-07 DIAGNOSIS — Z720 Tobacco use: Secondary | ICD-10-CM | POA: Diagnosis not present

## 2017-09-07 DIAGNOSIS — I10 Essential (primary) hypertension: Secondary | ICD-10-CM | POA: Diagnosis not present

## 2017-09-07 NOTE — Assessment & Plan Note (Signed)
Stable, continue present medications.   

## 2017-09-07 NOTE — Assessment & Plan Note (Signed)
I have recommended absolute tobacco cessation. I have discussed various options available for assistance with tobacco cessation including over the counter methods (Nicotine gum, patch and lozenges). We also discussed prescription options (Chantix, Nicotine Inhaler / Nasal Spray). The patient is not interested in pursuing any prescription tobacco cessation options at this time.  

## 2017-09-07 NOTE — Patient Instructions (Signed)

## 2017-09-07 NOTE — Progress Notes (Signed)
BP 113/67   Pulse 90   Temp 98.7 F (37.1 C)   Wt 271 lb 6.4 oz (123.1 kg)   LMP  (LMP Unknown)   SpO2 94%   BMI 47.62 kg/m    Subjective:    Patient ID: [name], female    DOB: [birth year], 61 y.o.   MRN: [id number]  HPI: [name] is a 61 y.o. female  Chief Complaint  Patient presents with  . Depression  . Diabetes  . Hyperlipidemia  . Hypertension   Depression This seems to be stable.   Depression screen [facility] 2/9 09/07/2017 06/07/2017 12/01/2016 06/10/2016 05/20/2016  Decreased Interest 0 1 1 0 0  Down, Depressed, Hopeless 0 1 1 0 0  PHQ - 2 Score 0 2 2 0 0  Altered sleeping 3 - 2 - -  Tired, decreased energy 1 - 1 - -  Change in appetite 3 - 0 - -  Feeling bad or failure about yourself  0 - 0 - -  Trouble concentrating 0 - 1 - -  Moving slowly or fidgety/restless 0 - 0 - -  Suicidal thoughts 0 - 0 - -  PHQ-9 Score 7 - 6 - -   Hip pain Needs right hip replacement.  They will not operate until she loses weight and stops smoking.    Hypertension  Using medications without difficulty Average home BPs   Using medication without problems or lightheadedness No chest pain with exertion or shortness of breath No Edema  Elevated Cholesterol Using medications without problems No Muscle aches  Diet: Exercise: Plans to go to the bariatric clinic to help lose weight  Tobacco/COPD Smoking a pack to pack and one half daily.  SOB with walking.  Takes a Spireva at night and albuterol seldom  Relevant past medical, surgical, family and social history reviewed and updated as indicated. Interim medical history since our last visit reviewed. Allergies and medications reviewed and updated.  Review of Systems  Per HPI unless specifically indicated above     Objective:    BP 113/67   Pulse 90   Temp 98.7 F (37.1 C)   Wt 271 lb 6.4 oz (123.1 kg)   LMP  (LMP Unknown)   SpO2 94%   BMI 47.62 kg/m   Wt Readings from Last 3 Encounters:  09/07/17 271 lb 6.4 oz  (123.1 kg)  06/07/17 265 lb 6.4 oz (120.4 kg)  12/01/16 267 lb 9.6 oz (121.4 kg)    Physical Exam  Constitutional: She is oriented to person, place, and time. She appears well-developed and well-nourished. No distress.  HENT:  Head: Normocephalic and atraumatic.  Eyes: Conjunctivae and lids are normal. Right eye exhibits no discharge. Left eye exhibits no discharge. No scleral icterus.  Neck: Normal range of motion. Neck supple. No JVD present. Carotid bruit is not present.  Cardiovascular: Normal rate, regular rhythm and normal heart sounds.   Pulmonary/Chest: Effort normal and breath sounds normal.  Abdominal: Normal appearance. There is no splenomegaly or hepatomegaly.  Musculoskeletal: Normal range of motion.  Neurological: She is alert and oriented to person, place, and time.  Skin: Skin is warm, dry and intact. No rash noted. No pallor.  Psychiatric: She has a normal mood and affect. Her behavior is normal. Judgment and thought content normal.     Assessment & Plan:   Problem List Items Addressed This Visit      Unprioritized   Anxiety    Stable, continue present medications.  COPD, severe ([place])    Stable, continue present medications.        Essential hypertension    Stable, continue present medications.        Tobacco abuse     I have recommended absolute tobacco cessation. I have discussed various options available for assistance with tobacco cessation including over the counter methods (Nicotine gum, patch and lozenges). We also discussed prescription options (Chantix, Nicotine Inhaler / Nasal Spray). The patient is not interested in pursuing any prescription tobacco cessation options at this time.        Other Visit Diagnoses    Need for influenza vaccination    -  Primary   Relevant Orders   Flu Vaccine QUAD 36+ mos IM (Completed)       Follow up plan: Return in about 3 months (around 12/08/2017) for January for physical.

## 2017-09-12 ENCOUNTER — Other Ambulatory Visit: Payer: Self-pay | Admitting: Family Medicine

## 2017-09-15 ENCOUNTER — Ambulatory Visit
Admission: RE | Admit: 2017-09-15 | Discharge: 2017-09-15 | Disposition: A | Discharge status: Home / Self Care | Payer: BLUE CROSS/BLUE SHIELD | Source: Ambulatory Visit | Attending: Oncology | Admitting: Oncology

## 2017-09-15 DIAGNOSIS — Z122 Encounter for screening for malignant neoplasm of respiratory organs: Secondary | ICD-10-CM | POA: Insufficient documentation

## 2017-09-15 DIAGNOSIS — I2510 Atherosclerotic heart disease of native coronary artery without angina pectoris: Secondary | ICD-10-CM | POA: Insufficient documentation

## 2017-09-15 DIAGNOSIS — J439 Emphysema, unspecified: Secondary | ICD-10-CM | POA: Insufficient documentation

## 2017-09-15 DIAGNOSIS — I700 Atherosclerosis of aorta: Secondary | ICD-10-CM | POA: Diagnosis not present

## 2017-09-15 DIAGNOSIS — Z87891 Personal history of nicotine dependence: Secondary | ICD-10-CM | POA: Diagnosis present

## 2017-09-20 ENCOUNTER — Encounter: Payer: Self-pay | Admitting: Unknown Physician Specialty

## 2017-09-20 ENCOUNTER — Other Ambulatory Visit: Payer: Self-pay | Admitting: *Deleted

## 2017-09-20 ENCOUNTER — Encounter: Payer: Self-pay | Admitting: *Deleted

## 2017-09-20 MED ORDER — FUROSEMIDE 40 MG PO TABS: 20.0000 mg | ORAL_TABLET | Freq: Every day | ORAL | 3 refills | Status: DC

## 2017-09-23 ENCOUNTER — Other Ambulatory Visit: Payer: Self-pay | Admitting: Unknown Physician Specialty

## 2017-09-24 ENCOUNTER — Other Ambulatory Visit: Payer: Self-pay | Admitting: Unknown Physician Specialty

## 2017-09-24 NOTE — Telephone Encounter (Signed)
Please see previous note; unsure if pt. Must pick up printed Rx in the office.

## 2017-09-24 NOTE — Telephone Encounter (Signed)
Reviewed pt's chart; noted that the Carisoprodol 350 mg was refilled on 10/25, but had been printed in office, instead of being sent electronically.  Skype message sent to Flow Coordinator to ask if this requires a printed Rx to be hand- carried to pharmacy.  Flow Coordinator to find out and advise.

## 2017-09-28 NOTE — Telephone Encounter (Signed)
RX faxed to the pharmacy with confirmation 09/24/17. [name] can you cancel this reorder so we can close the encounter please? The prescription has already been faxed to the pharmacy.

## 2017-09-30 ENCOUNTER — Encounter: Payer: Self-pay | Admitting: Unknown Physician Specialty

## 2017-10-01 ENCOUNTER — Other Ambulatory Visit: Payer: Self-pay

## 2017-10-01 MED ORDER — GABAPENTIN 800 MG PO TABS: 800.0000 mg | ORAL_TABLET | Freq: Three times a day (TID) | ORAL | 0 refills | Status: DC

## 2017-10-01 NOTE — Telephone Encounter (Signed)
Patient sent in a My Chart message requesting a refill on gabapentin 800 mg.

## 2017-10-09 ENCOUNTER — Other Ambulatory Visit: Payer: Self-pay | Admitting: Unknown Physician Specialty

## 2017-11-01 ENCOUNTER — Encounter: Payer: Self-pay | Admitting: Unknown Physician Specialty

## 2017-11-01 ENCOUNTER — Other Ambulatory Visit: Payer: Self-pay | Admitting: Unknown Physician Specialty

## 2017-11-09 ENCOUNTER — Other Ambulatory Visit: Payer: Self-pay | Admitting: Unknown Physician Specialty

## 2017-11-25 ENCOUNTER — Other Ambulatory Visit: Payer: Self-pay | Admitting: Unknown Physician Specialty

## 2017-11-28 ENCOUNTER — Other Ambulatory Visit: Payer: Self-pay | Admitting: Unknown Physician Specialty

## 2017-12-08 ENCOUNTER — Other Ambulatory Visit: Payer: Self-pay | Admitting: Unknown Physician Specialty

## 2017-12-09 NOTE — Telephone Encounter (Signed)
Medication request.

## 2017-12-09 NOTE — Telephone Encounter (Signed)
Routing to provider  

## 2017-12-14 ENCOUNTER — Encounter: Payer: Self-pay | Admitting: Unknown Physician Specialty

## 2017-12-14 DIAGNOSIS — M25551 Pain in right hip: Secondary | ICD-10-CM

## 2017-12-14 MED ORDER — HYDROXYZINE HCL 10 MG PO TABS: 10.0000 mg | ORAL_TABLET | Freq: Three times a day (TID) | ORAL | 1 refills | Status: DC | PRN

## 2017-12-14 MED ORDER — POTASSIUM CHLORIDE CRYS ER 20 MEQ PO TBCR: 20.0000 meq | EXTENDED_RELEASE_TABLET | Freq: Every day | ORAL | 3 refills | Status: DC

## 2017-12-19 ENCOUNTER — Other Ambulatory Visit: Payer: Self-pay | Admitting: Unknown Physician Specialty

## 2017-12-27 ENCOUNTER — Ambulatory Visit (INDEPENDENT_AMBULATORY_CARE_PROVIDER_SITE_OTHER): Payer: BLUE CROSS/BLUE SHIELD | Admitting: Unknown Physician Specialty

## 2017-12-27 ENCOUNTER — Encounter: Payer: Self-pay | Admitting: Unknown Physician Specialty

## 2017-12-27 ENCOUNTER — Other Ambulatory Visit: Payer: Self-pay | Admitting: Unknown Physician Specialty

## 2017-12-27 VITALS — BP 143/73 | HR 99 | Temp 98.4°F | Wt 278.2 lb

## 2017-12-27 DIAGNOSIS — F32A Depression, unspecified: Secondary | ICD-10-CM

## 2017-12-27 DIAGNOSIS — Z0000 Encounter for general adult medical examination without abnormal findings: Secondary | ICD-10-CM

## 2017-12-27 DIAGNOSIS — Z0001 Encounter for general adult medical examination with abnormal findings: Secondary | ICD-10-CM

## 2017-12-27 DIAGNOSIS — I10 Essential (primary) hypertension: Secondary | ICD-10-CM | POA: Diagnosis not present

## 2017-12-27 DIAGNOSIS — Z6841 Body Mass Index (BMI) 40.0 and over, adult: Secondary | ICD-10-CM

## 2017-12-27 DIAGNOSIS — F329 Major depressive disorder, single episode, unspecified: Secondary | ICD-10-CM | POA: Diagnosis not present

## 2017-12-27 DIAGNOSIS — M5136 Other intervertebral disc degeneration, lumbar region: Secondary | ICD-10-CM | POA: Diagnosis not present

## 2017-12-27 DIAGNOSIS — M1611 Unilateral primary osteoarthritis, right hip: Secondary | ICD-10-CM

## 2017-12-27 MED ORDER — BUPROPION HCL ER (XL) 300 MG PO TB24: 300.0000 mg | ORAL_TABLET | Freq: Every day | ORAL | 1 refills | Status: DC

## 2017-12-27 NOTE — Assessment & Plan Note (Signed)
Not to goal.  Will increase Wellbutrin from 150 to 300 mg/day

## 2017-12-27 NOTE — Progress Notes (Signed)
BP (!) 143/73   Pulse 99   Temp 98.4 F (36.9 C) (Oral)   Wt 278 lb 3.2 oz (126.2 kg)   LMP  (LMP Unknown)   SpO2 99%   BMI 49.28 kg/m    Subjective:    Patient ID: [name], female    DOB: 07/27/1956, 62 y.o.   MRN: [id number]  HPI: [name] is a 62 y.o. female  Chief Complaint  Patient presents with  . Depression  . Hypertension  . Pain    Depression Pt has been significantly more depressed since her husband's death.  Taking Wellbutrin and Duloxetine daily and Buspar twice a day.  Still having episodes of panic Depression screen [facility] 2/9 12/27/2017 09/07/2017 06/07/2017 12/01/2016 06/10/2016  Decreased Interest 2 0 1 1 0  Down, Depressed, Hopeless 1 0 1 1 0  PHQ - 2 Score 3 0 2 2 0  Altered sleeping 3 3 - 2 -  Tired, decreased energy 2 1 - 1 -  Change in appetite 0 3 - 0 -  Feeling bad or failure about yourself  1 0 - 0 -  Trouble concentrating 1 0 - 1 -  Moving slowly or fidgety/restless 0 0 - 0 -  Suicidal thoughts 0 0 - 0 -  PHQ-9 Score 10 7 - 6 -   Pain Pt with chronic back and hip pain.  Feels like nobody can help her.  Hip surgeon tells her she needs to lose 40 pounds and bariatric clinic won't help because she doesn't "eat right" and is too sedentary.  However, she can't be more active due to pain.  She had been going to the pain clinic but has been since Dr. Josefa Half moved to [place] [employer].  Having sleep disturbance due to pain  COPD Stable but not very active due to pain.  Taking daily Spireva.  Proair use is rare.  Down to smoking 1 ppd.    Hypertension Using medications without difficulty Average home BPs below 140/90   No problems or lightheadedness No chest pain with exertion or shortness of breath No Edema   Relevant past medical, surgical, family and social history reviewed and updated as indicated. Interim medical history since our last visit reviewed. Allergies and medications reviewed and updated.  Review of Systems  Constitutional:  Positive for fatigue.  HENT: Negative.   Eyes: Negative.   Respiratory: Negative.   Cardiovascular: Negative.   Gastrointestinal: Negative.   Endocrine: Positive for heat intolerance.  Genitourinary: Negative.   Musculoskeletal: Positive for arthralgias and back pain.  Skin: Negative.   Psychiatric/Behavioral:       Panic attacks    Per HPI unless specifically indicated above     Objective:    BP (!) 143/73   Pulse 99   Temp 98.4 F (36.9 C) (Oral)   Wt 278 lb 3.2 oz (126.2 kg)   LMP  (LMP Unknown)   SpO2 99%   BMI 49.28 kg/m   Wt Readings from Last 3 Encounters:  12/27/17 278 lb 3.2 oz (126.2 kg)  09/15/17 266 lb (120.7 kg)  09/07/17 271 lb 6.4 oz (123.1 kg)    Physical Exam  Constitutional: She is oriented to person, place, and time. She appears well-developed and well-nourished. No distress.  HENT:  Head: Normocephalic and atraumatic.  Eyes: Conjunctivae and lids are normal. Right eye exhibits no discharge. Left eye exhibits no discharge. No scleral icterus.  Neck: Normal range of motion. Neck supple. No JVD present. Carotid  bruit is not present.  Cardiovascular: Normal rate, regular rhythm and normal heart sounds.  Pulmonary/Chest: Effort normal and breath sounds normal.  Abdominal: Normal appearance. There is no splenomegaly or hepatomegaly.  Musculoskeletal:  Using a walker  Neurological: She is alert and oriented to person, place, and time.  Skin: Skin is warm, dry and intact. No rash noted. No pallor.  Psychiatric: She has a normal mood and affect. Her behavior is normal. Judgment and thought content normal.   Refusing breast exam.  Will order mammogram    Assessment & Plan:   Problem List Items Addressed This Visit      Unprioritized   DDD (degenerative disc disease), lumbar    Chronic pain.  Refer to pain clinic      Essential hypertension    A little high in the office but below 140/90 at home      Relevant Medications   furosemide (LASIX) 20  MG tablet   Other Relevant Orders   Lipid Panel w/o Chol/HDL Ratio   Comprehensive metabolic panel   Mood disorder of depressed type    Not to goal.  Will increase Wellbutrin from 150 to 300 mg/day      Relevant Medications   buPROPion (WELLBUTRIN XL) 300 MG 24 hr tablet   Obesity    Feeling hopeless about this.  Refusing PT at this time.  She will consider [employer] on-line      Osteoarthritis of right hip    Unable to get surgery at this time.  Will refer to pain clinic      Relevant Orders   Ambulatory referral to Pain Clinic    Other Visit Diagnoses    Annual physical exam    -  Primary   Relevant Orders   Ambulatory referral to Pain Clinic   MM DIGITAL SCREENING BILATERAL   CBC with Differential/Platelet   TSH       Follow up plan: Return in about 3 months (around 03/27/2018).

## 2017-12-27 NOTE — Assessment & Plan Note (Signed)
Chronic pain.  Refer to pain clinic

## 2017-12-27 NOTE — Assessment & Plan Note (Signed)
Unable to get surgery at this time.  Will refer to pain clinic

## 2017-12-27 NOTE — Patient Instructions (Signed)
Please do call to schedule your mammogram; the number to schedule one at either [facility] or [facility] is [phone]   

## 2017-12-27 NOTE — Assessment & Plan Note (Addendum)
Feeling hopeless about this.  Refusing PT at this time.  She will consider [employer] on-line

## 2017-12-27 NOTE — Assessment & Plan Note (Signed)
A little high in the office but below 140/90 at home

## 2017-12-28 LAB — CBC WITH DIFFERENTIAL/PLATELET
Basophils Absolute: 0.1 10*3/uL (ref 0.0–0.2)
Basos: 1 %
EOS (ABSOLUTE): 0.2 10*3/uL (ref 0.0–0.4)
Eos: 2 %
Hematocrit: 39.2 % (ref 34.0–46.6)
Hemoglobin: 12.6 g/dL (ref 11.1–15.9)
Immature Grans (Abs): 0 10*3/uL (ref 0.0–0.1)
Immature Granulocytes: 0 %
Lymphocytes Absolute: 3.6 10*3/uL — ABNORMAL HIGH (ref 0.7–3.1)
Lymphs: 27 %
MCH: 29 pg (ref 26.6–33.0)
MCHC: 32.1 g/dL (ref 31.5–35.7)
MCV: 90 fL (ref 79–97)
Monocytes Absolute: 1.3 10*3/uL — ABNORMAL HIGH (ref 0.1–0.9)
Monocytes: 9 %
Neutrophils Absolute: 8.3 10*3/uL — ABNORMAL HIGH (ref 1.4–7.0)
Neutrophils: 61 %
Platelets: 313 10*3/uL (ref 150–379)
RBC: 4.35 x10E6/uL (ref 3.77–5.28)
RDW: 13.9 % (ref 12.3–15.4)
WBC: 13.5 10*3/uL — ABNORMAL HIGH (ref 3.4–10.8)

## 2017-12-28 LAB — COMPREHENSIVE METABOLIC PANEL
ALT: 19 IU/L (ref 0–32)
AST: 20 IU/L (ref 0–40)
Albumin/Globulin Ratio: 1.6 (ref 1.2–2.2)
Albumin: 4.1 g/dL (ref 3.6–4.8)
Alkaline Phosphatase: 109 IU/L (ref 39–117)
BUN/Creatinine Ratio: 12 (ref 12–28)
BUN: 13 mg/dL (ref 8–27)
Bilirubin Total: 0.2 mg/dL (ref 0.0–1.2)
CO2: 21 mmol/L (ref 20–29)
Calcium: 9.3 mg/dL (ref 8.7–10.3)
Chloride: 103 mmol/L (ref 96–106)
Creatinine, Ser: 1.08 mg/dL — ABNORMAL HIGH (ref 0.57–1.00)
GFR calc Af Amer: 64 mL/min/{1.73_m2} (ref >59)
GFR calc non Af Amer: 55 mL/min/{1.73_m2} — ABNORMAL LOW (ref >59)
Globulin, Total: 2.5 g/dL (ref 1.5–4.5)
Glucose: 90 mg/dL (ref 65–99)
Potassium: 4.5 mmol/L (ref 3.5–5.2)
Sodium: 141 mmol/L (ref 134–144)
Total Protein: 6.6 g/dL (ref 6.0–8.5)

## 2017-12-28 LAB — LIPID PANEL W/O CHOL/HDL RATIO
Cholesterol, Total: 213 mg/dL — ABNORMAL HIGH (ref 100–199)
HDL: 54 mg/dL (ref >39)
LDL Calculated: 124 mg/dL — ABNORMAL HIGH (ref 0–99)
Triglycerides: 173 mg/dL — ABNORMAL HIGH (ref 0–149)
VLDL Cholesterol Cal: 35 mg/dL (ref 5–40)

## 2017-12-28 LAB — TSH: TSH: 3.16 u[IU]/mL (ref 0.450–4.500)

## 2017-12-30 ENCOUNTER — Telehealth: Payer: Self-pay | Admitting: Unknown Physician Specialty

## 2017-12-30 NOTE — Telephone Encounter (Signed)
Routing to provider  

## 2017-12-30 NOTE — Telephone Encounter (Signed)
Copied from [place] [id number]. Topic: General - Other >> Dec 30, 2017 11:11 AM Cecelia Byars, NT wrote: Reason for CRM: Patient needs a order for a x ray  on her r. Hip , she is having hip replacement surgery is the reason please advise [phone]

## 2017-12-31 ENCOUNTER — Other Ambulatory Visit: Payer: Self-pay | Admitting: Unknown Physician Specialty

## 2017-12-31 MED ORDER — CARISOPRODOL 350 MG PO TABS: 350.0000 mg | ORAL_TABLET | Freq: Three times a day (TID) | ORAL | 5 refills | Status: DC

## 2017-12-31 NOTE — Telephone Encounter (Signed)
Called and left patient a VM letting her know that Malachy Mood said.

## 2017-12-31 NOTE — Addendum Note (Signed)
Addended by: [name] on: 12/31/2017 04:53 PM   Modules accepted: Orders

## 2017-12-31 NOTE — Telephone Encounter (Signed)
This would be something the surgeon would order

## 2018-01-03 ENCOUNTER — Ambulatory Visit
Admission: RE | Admit: 2018-01-03 | Discharge: 2018-01-03 | Disposition: A | Discharge status: Home / Self Care | Payer: BLUE CROSS/BLUE SHIELD | Source: Ambulatory Visit | Attending: Unknown Physician Specialty | Admitting: Unknown Physician Specialty

## 2018-01-03 DIAGNOSIS — Z1231 Encounter for screening mammogram for malignant neoplasm of breast: Secondary | ICD-10-CM | POA: Diagnosis present

## 2018-01-03 DIAGNOSIS — Z0000 Encounter for general adult medical examination without abnormal findings: Secondary | ICD-10-CM

## 2018-01-05 ENCOUNTER — Ambulatory Visit
Payer: BLUE CROSS/BLUE SHIELD | Attending: Student in an Organized Health Care Education/Training Program | Admitting: Student in an Organized Health Care Education/Training Program

## 2018-01-05 ENCOUNTER — Telehealth: Payer: Self-pay | Admitting: Unknown Physician Specialty

## 2018-01-05 ENCOUNTER — Ambulatory Visit
Admission: RE | Admit: 2018-01-05 | Discharge: 2018-01-05 | Disposition: A | Discharge status: Home / Self Care | Payer: BLUE CROSS/BLUE SHIELD | Source: Ambulatory Visit | Attending: Unknown Physician Specialty | Admitting: Unknown Physician Specialty

## 2018-01-05 ENCOUNTER — Other Ambulatory Visit: Payer: Self-pay | Admitting: Unknown Physician Specialty

## 2018-01-05 ENCOUNTER — Encounter: Payer: Self-pay | Admitting: Student in an Organized Health Care Education/Training Program

## 2018-01-05 ENCOUNTER — Other Ambulatory Visit: Payer: Self-pay

## 2018-01-05 VITALS — BP 126/50 | HR 89 | Temp 98.1°F | Resp 16 | Ht 63.0 in | Wt 271.0 lb

## 2018-01-05 DIAGNOSIS — M5136 Other intervertebral disc degeneration, lumbar region: Secondary | ICD-10-CM | POA: Diagnosis not present

## 2018-01-05 DIAGNOSIS — Z823 Family history of stroke: Secondary | ICD-10-CM | POA: Diagnosis not present

## 2018-01-05 DIAGNOSIS — M25551 Pain in right hip: Secondary | ICD-10-CM

## 2018-01-05 DIAGNOSIS — K449 Diaphragmatic hernia without obstruction or gangrene: Secondary | ICD-10-CM | POA: Insufficient documentation

## 2018-01-05 DIAGNOSIS — G250 Essential tremor: Secondary | ICD-10-CM | POA: Diagnosis not present

## 2018-01-05 DIAGNOSIS — Z79899 Other long term (current) drug therapy: Secondary | ICD-10-CM | POA: Diagnosis not present

## 2018-01-05 DIAGNOSIS — F419 Anxiety disorder, unspecified: Secondary | ICD-10-CM | POA: Diagnosis not present

## 2018-01-05 DIAGNOSIS — M5116 Intervertebral disc disorders with radiculopathy, lumbar region: Secondary | ICD-10-CM | POA: Insufficient documentation

## 2018-01-05 DIAGNOSIS — Z90710 Acquired absence of both cervix and uterus: Secondary | ICD-10-CM | POA: Insufficient documentation

## 2018-01-05 DIAGNOSIS — M170 Bilateral primary osteoarthritis of knee: Secondary | ICD-10-CM

## 2018-01-05 DIAGNOSIS — J449 Chronic obstructive pulmonary disease, unspecified: Secondary | ICD-10-CM | POA: Diagnosis not present

## 2018-01-05 DIAGNOSIS — Z981 Arthrodesis status: Secondary | ICD-10-CM | POA: Insufficient documentation

## 2018-01-05 DIAGNOSIS — M48062 Spinal stenosis, lumbar region with neurogenic claudication: Secondary | ICD-10-CM

## 2018-01-05 DIAGNOSIS — M1611 Unilateral primary osteoarthritis, right hip: Secondary | ICD-10-CM | POA: Diagnosis not present

## 2018-01-05 DIAGNOSIS — Z791 Long term (current) use of non-steroidal anti-inflammatories (NSAID): Secondary | ICD-10-CM | POA: Insufficient documentation

## 2018-01-05 DIAGNOSIS — F17210 Nicotine dependence, cigarettes, uncomplicated: Secondary | ICD-10-CM | POA: Insufficient documentation

## 2018-01-05 DIAGNOSIS — Z807 Family history of other malignant neoplasms of lymphoid, hematopoietic and related tissues: Secondary | ICD-10-CM | POA: Insufficient documentation

## 2018-01-05 DIAGNOSIS — K219 Gastro-esophageal reflux disease without esophagitis: Secondary | ICD-10-CM | POA: Insufficient documentation

## 2018-01-05 DIAGNOSIS — G894 Chronic pain syndrome: Secondary | ICD-10-CM

## 2018-01-05 DIAGNOSIS — F329 Major depressive disorder, single episode, unspecified: Secondary | ICD-10-CM | POA: Diagnosis not present

## 2018-01-05 DIAGNOSIS — M5416 Radiculopathy, lumbar region: Secondary | ICD-10-CM

## 2018-01-05 DIAGNOSIS — M47816 Spondylosis without myelopathy or radiculopathy, lumbar region: Secondary | ICD-10-CM | POA: Diagnosis not present

## 2018-01-05 DIAGNOSIS — Z9889 Other specified postprocedural states: Secondary | ICD-10-CM | POA: Diagnosis not present

## 2018-01-05 DIAGNOSIS — Z7982 Long term (current) use of aspirin: Secondary | ICD-10-CM | POA: Diagnosis not present

## 2018-01-05 DIAGNOSIS — E785 Hyperlipidemia, unspecified: Secondary | ICD-10-CM | POA: Insufficient documentation

## 2018-01-05 DIAGNOSIS — I129 Hypertensive chronic kidney disease with stage 1 through stage 4 chronic kidney disease, or unspecified chronic kidney disease: Secondary | ICD-10-CM | POA: Diagnosis not present

## 2018-01-05 DIAGNOSIS — M51369 Other intervertebral disc degeneration, lumbar region without mention of lumbar back pain or lower extremity pain: Secondary | ICD-10-CM

## 2018-01-05 DIAGNOSIS — M533 Sacrococcygeal disorders, not elsewhere classified: Secondary | ICD-10-CM

## 2018-01-05 DIAGNOSIS — Z825 Family history of asthma and other chronic lower respiratory diseases: Secondary | ICD-10-CM | POA: Insufficient documentation

## 2018-01-05 DIAGNOSIS — Z8249 Family history of ischemic heart disease and other diseases of the circulatory system: Secondary | ICD-10-CM | POA: Diagnosis not present

## 2018-01-05 MED ORDER — AMITRIPTYLINE HCL 25 MG PO TABS: ORAL_TABLET | ORAL | 1 refills | Status: DC

## 2018-01-05 NOTE — Telephone Encounter (Signed)
Called and spoke to [place]. Verbal OK received from [facility] to change the hip x-ray order to 2-3 views instead of just one.

## 2018-01-05 NOTE — Telephone Encounter (Signed)
Copied from [place]. Topic: Quick Communication - See Telephone Encounter >> Jan 05, 2018 12:06 PM Ether Griffins B wrote: CRM for notification. See Telephone encounter for:  [name] with [facility] radiology calling about an order for this pt. Pt is at radiology please call back asap. [phone] 01/05/18.

## 2018-01-05 NOTE — Progress Notes (Signed)
Safety precautions to be maintained throughout the outpatient stay will include: orient to surroundings, keep bed in low position, maintain call bell within reach at all times, provide assistance with transfer out of bed and ambulation.  

## 2018-01-05 NOTE — Patient Instructions (Addendum)
1. UDS today 2. Schedule for R- L ESI with sedation 3. Amitriptyline Rx  ____________________________________________________________________________________________  Risk(s) and Possible Complications  Patient Responsibilities: It is important that you read this as it is part of your informed consent. It is our duty to inform you of the risks and possible complications associated with treatments offered to you. It is your responsibility as a patient to read this and to ask questions about anything that is not clear or that you believe was not covered in this document.  Patient's Rights: You have the right to refuse treatment. You also have the right to change your mind, even after initially having agreed to have the treatment done. However, under this last option, if you wait until the last second to change your mind, you may be charged for the materials used up to that point.  Introduction: Medicine is not an Chief Strategy Officer. Everything in Medicine, including the lack of treatment(s), carries the potential for danger, harm, or loss (which is by definition: Risk). In Medicine, a complication is a secondary problem, condition, or disease that can aggravate an already existing one. All treatments carry the risk of possible complications. The fact that a side effects or complications occurs, does not imply that the treatment was conducted incorrectly. It must be clearly understood that these can happen even when everything is done following the highest safety standards.  No treatment: You can choose not to proceed with the proposed treatment alternative. The "PRO(s)" would include: avoiding the risk of complications associated with the therapy. The "CON(s)" would include: not getting any of the treatment benefits. These benefits fall under one of three categories: diagnostic; therapeutic; and/or palliative. Diagnostic benefits include: getting information which can ultimately lead to improvement of the  disease or symptom(s). Therapeutic benefits are those associated with the successful treatment of the disease. Finally, palliative benefits are those related to the decrease of the primary symptoms, without necessarily curing the condition (example: decreasing the pain from a flare-up of a chronic condition, such as incurable terminal cancer).  General Risks and Complications: These are associated to most interventional treatments. They can occur alone, or in combination. They fall under one of the following six (6) categories: no benefit or worsening of symptoms; bleeding; infection; nerve damage; allergic reactions; and/or death. 1. No benefits or worsening of symptoms: In Medicine there are no guarantees, only probabilities. No healthcare provider can ever guarantee that a medical treatment will work, they can only state the probability that it may. Furthermore, there is always the possibility that the condition may worsen, either directly, or indirectly, as a consequence of the treatment. 2. Bleeding: This is more common if the patient is taking a blood thinner, either prescription or over the counter (example: Goody Powders, Fish oil, Aspirin, Garlic, etc.), or if suffering a condition associated with impaired coagulation (example: Hemophilia, cirrhosis of the liver, low platelet counts, etc.). However, even if you do not have one on these, it can still happen. If you have any of these conditions, or take one of these drugs, make sure to notify your treating physician. 3. Infection: This is more common in patients with a compromised immune system, either due to disease (example: diabetes, cancer, human immunodeficiency virus [HIV], etc.), or due to medications or treatments (example: therapies used to treat cancer and rheumatological diseases). However, even if you do not have one on these, it can still happen. If you have any of these conditions, or take one of these drugs, make sure  to notify your  treating physician. 4. Nerve Damage: This is more common when the treatment is an invasive one, but it can also happen with the use of medications, such as those used in the treatment of cancer. The damage can occur to small secondary nerves, or to large primary ones, such as those in the spinal cord and brain. This damage may be temporary or permanent and it may lead to impairments that can range from temporary numbness to permanent paralysis and/or brain death. 5. Allergic Reactions: Any time a substance or material comes in contact with our body, there is the possibility of an allergic reaction. These can range from a mild skin rash (contact dermatitis) to a severe systemic reaction (anaphylactic reaction), which can result in death. 6. Death: In general, any medical intervention can result in death, most of the time due to an unforeseen complication. ____________________________________________________________________________________________   Epidural Steroid Injection Patient Information  Description: The epidural space surrounds the nerves as they exit the spinal cord.  In some patients, the nerves can be compressed and inflamed by a bulging disc or a tight spinal canal (spinal stenosis).  By injecting steroids into the epidural space, we can bring irritated nerves into direct contact with a potentially helpful medication.  These steroids act directly on the irritated nerves and can reduce swelling and inflammation which often leads to decreased pain.  Epidural steroids may be injected anywhere along the spine and from the neck to the low back depending upon the location of your pain.   After numbing the skin with local anesthetic (like Novocaine), a small needle is passed into the epidural space slowly.  You may experience a sensation of pressure while this is being done.  The entire block usually last less than 10 minutes.  Conditions which may be treated by epidural steroids:   Low back and  leg pain  Neck and arm pain  Spinal stenosis  Post-laminectomy syndrome  Herpes zoster (shingles) pain  Pain from compression fractures  Preparation for the injection:  1. Do not eat any solid food or dairy products within 8 hours of your appointment.  2. You may drink clear liquids up to 3 hours before appointment.  Clear liquids include water, black coffee, juice or soda.  No milk or cream please. 3. You may take your regular medication, including pain medications, with a sip of water before your appointment  Diabetics should hold regular insulin (if taken separately) and take 1/2 normal NPH dos the morning of the procedure.  Carry some sugar containing items with you to your appointment. 4. A driver must accompany you and be prepared to drive you home after your procedure.  5. Bring all your current medications with your. 6. An IV may be inserted and sedation may be given at the discretion of the physician.   7. A blood pressure cuff, EKG and other monitors will often be applied during the procedure.  Some patients may need to have extra oxygen administered for a short period. 8. You will be asked to provide medical information, including your allergies, prior to the procedure.  We must know immediately if you are taking blood thinners (like Coumadin/Warfarin)  Or if you are allergic to IV iodine contrast (dye). We must know if you could possible be pregnant.  Possible side-effects:  Bleeding from needle site  Infection (rare, may require surgery)  Nerve injury (rare)  Numbness & tingling (temporary)  Difficulty urinating (rare, temporary)  Spinal headache ( a  headache worse with upright posture)  Light -headedness (temporary)  Pain at injection site (several days)  Decreased blood pressure (temporary)  Weakness in arm/leg (temporary)  Pressure sensation in back/neck (temporary)  Call if you experience:  Fever/chills associated with headache or increased back/neck  pain.  Headache worsened by an upright position.  New onset weakness or numbness of an extremity below the injection site  Hives or difficulty breathing (go to the emergency room)  Inflammation or drainage at the infection site  Severe back/neck pain  Any new symptoms which are concerning to you  Please note:  Although the local anesthetic injected can often make your back or neck feel good for several hours after the injection, the pain will likely return.  It takes 3-7 days for steroids to work in the epidural space.  You may not notice any pain relief for at least that one week.  If effective, we will often do a series of three injections spaced 3-6 weeks apart to maximally decrease your pain.  After the initial series, we generally will wait several months before considering a repeat injection of the same type.  If you have any questions, please call [phone] [facility]

## 2018-01-05 NOTE — Progress Notes (Signed)
Patient's Name: [name]  MRN: [id number]  Referring Provider: [name], NP  DOB: February 11, 1956  PCP: [name], NP  DOS: 01/05/2018  Note by: Gillis Santa, MD  Service setting: Ambulatory outpatient  Specialty: Interventional Pain Management  Location: [facility] (AMB) Pain Management Facility  Visit type: Initial Patient Evaluation  Patient type: New Patient   Primary Reason(s) for Visit: Encounter for initial evaluation of one or more chronic problems (new to examiner) potentially causing chronic pain, and posing a threat to normal musculoskeletal function. (Level of risk: High) CC: Knee Pain (right); Hip Pain (right); and Back Pain (lower)  HPI  [name] is a 62 y.o. year old, female patient, who comes today to see [place] for the first time for an initial evaluation of her chronic pain. She has Anxiety; Spasm; Panic disorder; Mood disorder of depressed type; DDD (degenerative disc disease), lumbar; COPD, severe ([place]); Hyperlipidemia; Gastroesophageal reflux disease; Radiculopathy, lumbar region; Sleep apnea; Obesity; Benign essential tremor; Chronic pain; Facet syndrome, lumbar; Spinal stenosis, lumbar region, with neurogenic claudication; Status post cervical spinal fusion; DJD (degenerative joint disease) of knee; Tobacco abuse; Benign hypertension; Essential hypertension; Advanced care planning/counseling discussion; and Osteoarthritis of right hip on their problem list. Today she comes in for evaluation of her Knee Pain (right); Hip Pain (right); and Back Pain (lower)  Pain Assessment: Location: Right Knee Radiating: Pt states she cannot say where pain originates, but her lower back, right hip, "bone on bone", and right leg are involved. Pain moves from right hip around top of thigh to groin to knee. Onset: More than a month ago Duration: Chronic pain Quality: Sharp, Sore, Constant Severity: 9 /10 (self-reported pain score)  Note: Reported level is inconsistent with clinical  observations. Clinically the patient looks like a 4/10 A 4/10 is viewed as "Moderately Severe" and described as impossible to ignore for more than a few minutes. With effort, patients may still be able to manage work or participate in some social activities. Very difficult to concentrate. Signs of autonomic nervous system discharge are evident: dilated pupils (mydriasis); mild sweating (diaphoresis); sleep interference. Heart rate becomes elevated (>115 bpm). Diastolic blood pressure (lower number) rises above 100 mmHg. Patients find relief in laying down and not moving.       When using our objective Pain Scale, levels between 6 and 10/10 are said to belong in an emergency room, as it progressively worsens from a 6/10, described as severely limiting, requiring emergency care not usually available at an outpatient pain management facility. At a 6/10 level, communication becomes difficult and requires great effort. Assistance to reach the emergency department may be required. Facial flushing and profuse sweating along with potentially dangerous increases in heart rate and blood pressure will be evident. Effect on ADL: "I cannot do anything".  Timing: Constant Modifying factors: ibuprofen  Onset and Duration: Date of onset: not listed Cause of pain: not listed Severity: Getting worse and NAS-11 at its worse: 9/10 Timing: During activity or exercise and After activity or exercise Aggravating Factors: Motion Alleviating Factors: Sitting Associated Problems: Depression, Spasms and Pain that wakes patient up Quality of Pain: Constant, Deep, Sharp and Stabbing Previous Examinations or Tests: Bone scan, CT scan, Endoscopy, MRI scan and X-rays Previous Treatments: The patient denies none  The patient comes into the clinics today for the first time for a chronic pain management evaluation.   62 year old female who presents with a chief complaint of axial low back pain that radiates into her right hip  and  then to her anterior thigh to her groin and then down to her knees.  Patient does have a history of a cervical discectomy and fusion but she is not endorsing any significant neck pain.  Of note, she was previously a pain patient at this clinic in the past with Dr. Primus Bravo.  She follows up hoping to reestablish care here.  She describes numbness and tingling along her right hip and anterior thigh.  Her lumbar MRI is significant for L4-L5 disc extrusion on the left abutting the left anterior thecal sac and left-sided nerve roots along with moderate degenerative facet hypertrophy and moderate lateral recess foraminal stenosis.  She also has moderate degenerative facet changes right greater than left at L5-S1.  Patient is on gabapentin 800 mill grams 3 times daily, Soma 3 times a day, Cymbalta 60 mg daily.  She is not on any opioid therapy.  Last opioid prescription was for hydrocodone 10 mg, quantity 120, last filled 09/08/2016.  Patient will not be a candidate for opioid therapy given her intake of Soma and its associated increased risk of respiratory depression and cardiorespiratory events when taken concomitantly with opioid medications.    Today I took the time to provide the patient with information regarding my pain practice. The patient was informed that my practice is divided into two sections: an interventional pain management section, as well as a completely separate and distinct medication management section. I explained that I have procedure days for my interventional therapies, and evaluation days for follow-ups and medication management. Because of the amount of documentation required during both, they are kept separated. This means that there is the possibility that she may be scheduled for a procedure on one day, and medication management the next. I have also informed her that because of staffing and facility limitations, I no longer take patients for medication management only. To illustrate the  reasons for this, I gave the patient the example of surgeons, and how inappropriate it would be to refer a patient to his/her care, just to write for the post-surgical antibiotics on a surgery done by a different surgeon.   Because interventional pain management is my board-certified specialty, the patient was informed that joining my practice means that they are open to any and all interventional therapies. I made it clear that this does not mean that they will be forced to have any procedures done. What this means is that I believe interventional therapies to be essential part of the diagnosis and proper management of chronic pain conditions. Therefore, patients not interested in these interventional alternatives will be better served under the care of a different practitioner.  The patient was also made aware of my Comprehensive Pain Management Safety Guidelines where by joining my practice, they limit all of their nerve blocks and joint injections to those done by our practice, for as long as we are retained to manage their care.   Historic Controlled Substance Pharmacotherapy Review  PMP and historical list of controlled substances:  hydrocodone 10 mg, quantity 120, last filled 09/08/2016. Medications: The patient did not bring the medication(s) to the appointment, as requested in our "New Patient Package" Pharmacodynamics: Desired effects: Analgesia: The patient reports <50% benefit. Reported improvement in function: The patient reports medication allows her to accomplish basic ADLs. Clinically meaningful improvement in function (CMIF): Sustained CMIF goals met Perceived effectiveness: Described as relatively effective, allowing for increase in activities of daily living (ADL) Undesirable effects: Side-effects or Adverse reactions: None reported Historical  Monitoring: The patient  reports that she does not use drugs. List of all UDS Test(s): No results found for: MDMA, COCAINSCRNUR,  [place], [place], CANNABQUANT, [place], [place] List of other Serum/Urine Drug Screening Test(s):  No results found for: AMPHSCRSER, BARBSCRSER, BENZOSCRSER, COCAINSCRSER, COCAINSCRNUR, PCPSCRSER, PCPQUANT, THCSCRSER, THCU, CANNABQUANT, OPIATESCRSER, OXYSCRSER, PROPOXSCRSER, ETH Historical Background Evaluation: [place] PMP: Six (6) year initial data search conducted.             [place] Department of public safety, offender search: Editor, commissioning Information) Non-contributory Risk Assessment Profile: Aberrant behavior: None observed or detected today Risk factors for fatal opioid overdose: None identified today Fatal overdose hazard ratio (HR): Calculation deferred Non-fatal overdose hazard ratio (HR): Calculation deferred Risk of opioid abuse or dependence: 0.7-3.0% with doses ? 36 MME/day and 6.1-26% with doses ? 120 MME/day. Substance use disorder (SUD) risk level: Moderate Opioid risk tool (ORT) (Total Score): 2 Opioid Risk Tool - 01/05/18 1051      Family History of Substance Abuse   Alcohol  Positive Female    Illegal Drugs  Negative    Rx Drugs  Negative      Personal History of Substance Abuse   Alcohol  Negative    Illegal Drugs  Negative    Rx Drugs  Negative      Age   Age between 21-45 years   No      Psychological Disease   Psychological Disease  Negative    Depression  Positive      Total Score   Opioid Risk Tool Scoring  2    Opioid Risk Interpretation  Low Risk      ORT Scoring interpretation table:  Score <3 = Low Risk for SUD  Score between 4-7 = Moderate Risk for SUD  Score >8 = High Risk for Opioid Abuse   PHQ-2 Depression Scale:  Total score: 0  PHQ-2 Scoring interpretation table: (Score and probability of major depressive disorder)  Score 0 = No depression  Score 1 = 15.4% Probability  Score 2 = 21.1% Probability  Score 3 = 38.4% Probability  Score 4 = 45.5% Probability  Score 5 = 56.4% Probability  Score 6 = 78.6% Probability   PHQ-9 Depression Scale:  Total  score: 1  PHQ-9 Scoring interpretation table:  Score 0-4 = No depression  Score 5-9 = Mild depression  Score 10-14 = Moderate depression  Score 15-19 = Moderately severe depression  Score 20-27 = Severe depression (2.4 times higher risk of SUD and 2.89 times higher risk of overuse)   Pharmacologic Plan: No opioid analgesics. on Soma Initial impression: Poor candidate for opioid analgesics.  Meds   Current Outpatient Medications:  .  aspirin 81 MG tablet, Take 81 mg by mouth daily., Disp: , Rfl:  .  buPROPion (WELLBUTRIN XL) 300 MG 24 hr tablet, Take 1 tablet (300 mg total) by mouth daily., Disp: 90 tablet, Rfl: 1 .  busPIRone (BUSPAR) 10 MG tablet, TAKE 1 TABLET BY MOUTH TWICE DAILY, Disp: 180 tablet, Rfl: 0 .  Calcium Citrate-Vitamin D (CALCIUM + D PO), Take 1,000 mg by mouth daily., Disp: , Rfl:  .  carisoprodol (SOMA) 350 MG tablet, Take 1 tablet (350 mg total) by mouth 3 (three) times daily., Disp: 90 tablet, Rfl: 5 .  DULoxetine (CYMBALTA) 60 MG capsule, Take 1 capsule (60 mg total) by mouth daily., Disp: 90 capsule, Rfl: 3 .  furosemide (LASIX) 20 MG tablet, Take 20 mg by mouth daily., Disp: , Rfl:  .  gabapentin (NEURONTIN) 800 MG tablet, TAKE 1 TABLET BY MOUTH THREE TIMES DAILY, Disp: 360 tablet, Rfl: 0 .  hydrOXYzine (ATARAX/VISTARIL) 10 MG tablet, Take 1 tablet (10 mg total) by mouth 3 (three) times daily as needed., Disp: 90 tablet, Rfl: 1 .  ibuprofen (ADVIL,MOTRIN) 200 MG tablet, Take 800 mg by mouth every 4 (four) hours as needed., Disp: , Rfl:  .  lovastatin (MEVACOR) 40 MG tablet, Take 1 tablet (40 mg total) by mouth at bedtime., Disp: 90 tablet, Rfl: 1 .  Multiple Vitamins-Minerals (MULTIVITAMIN WITH MINERALS) tablet, Take 1 tablet by mouth daily., Disp: , Rfl:  .  omeprazole (PRILOSEC) 40 MG capsule, Take 1 capsule (40 mg total) by mouth daily., Disp: 90 capsule, Rfl: 3 .  potassium chloride SA (KLOR-CON M20) 20 MEQ tablet, Take 1 tablet (20 mEq total) by mouth daily.,  Disp: 90 tablet, Rfl: 3 .  PROAIR HFA 108 (90 Base) MCG/ACT inhaler, INHALE 2 PUFFS BY MOUTH EVERY 6 HOURS, Disp: 8.5 g, Rfl: 0 .  telmisartan-hydrochlorothiazide (MICARDIS HCT) 40-12.5 MG tablet, TAKE 1 TABLET BY MOUTH ONCE DAILY, Disp: 30 tablet, Rfl: 6 .  tiotropium (SPIRIVA HANDIHALER) 18 MCG inhalation capsule, Place 1 capsule (18 mcg total) into inhaler and inhale daily., Disp: 90 capsule, Rfl: 3 .  amitriptyline (ELAVIL) 25 MG tablet, 25 mg qhs x 3 weeks then 50 mg qhs, Disp: 60 tablet, Rfl: 1  Current Facility-Administered Medications:  .  bupivacaine (PF) (MARCAINE) 0.25 % injection 30 mL, 30 mL, Other, Once, Mohammed Kindle, MD .  bupivacaine (PF) (MARCAINE) 0.25 % injection 30 mL, 30 mL, Other, Once, Mohammed Kindle, MD .  bupivacaine (PF) (MARCAINE) 0.25 % injection 30 mL, 30 mL, Other, Once, Mohammed Kindle, MD .  bupivacaine (PF) (MARCAINE) 0.25 % injection 30 mL, 30 mL, Other, Once, Mohammed Kindle, MD .  ceFAZolin (ANCEF) IVPB 1 g/50 mL premix, 1 g, Intravenous, Once, Mohammed Kindle, MD .  ceFAZolin (ANCEF) IVPB 1 g/50 mL premix, 1 g, Intravenous, Once, Mohammed Kindle, MD .  ceFAZolin (ANCEF) IVPB 1 g/50 mL premix, 1 g, Intravenous, Once, Mohammed Kindle, MD .  fentaNYL (SUBLIMAZE) injection 100 mcg, 100 mcg, Intravenous, Once, Mohammed Kindle, MD .  fentaNYL (SUBLIMAZE) injection 100 mcg, 100 mcg, Intravenous, Once, Mohammed Kindle, MD .  lactated ringers infusion 1,000 mL, 1,000 mL, Intravenous, Continuous, Mohammed Kindle, MD .  lactated ringers infusion 1,000 mL, 1,000 mL, Intravenous, Continuous, Mohammed Kindle, MD .  lactated ringers infusion 1,000 mL, 1,000 mL, Intravenous, Continuous, Mohammed Kindle, MD .  lactated ringers infusion 1,000 mL, 1,000 mL, Intravenous, Continuous, Mohammed Kindle, MD .  lidocaine (PF) (XYLOCAINE) 1 % injection 10 mL, 10 mL, Subcutaneous, Once, Mohammed Kindle, MD .  lidocaine (PF) (XYLOCAINE) 1 % injection 10 mL, 10 mL, Subcutaneous, Once, Mohammed Kindle, MD .  midazolam (VERSED) 5 MG/5ML injection 5 mg, 5 mg, Intravenous, Once, Mohammed Kindle, MD .  midazolam (VERSED) 5 MG/5ML injection 5 mg, 5 mg, Intravenous, Once, Mohammed Kindle, MD .  orphenadrine (NORFLEX) injection 60 mg, 60 mg, Intramuscular, Once, Mohammed Kindle, MD .  orphenadrine (NORFLEX) injection 60 mg, 60 mg, Intramuscular, Once, Mohammed Kindle, MD .  orphenadrine (NORFLEX) injection 60 mg, 60 mg, Intramuscular, Once, Mohammed Kindle, MD .  sodium chloride flush (NS) 0.9 % injection 20 mL, 20 mL, Other, Once, Mohammed Kindle, MD .  sodium chloride flush (NS) 0.9 % injection 20 mL, 20 mL, Other, Once, Mohammed Kindle, MD .  triamcinolone acetonide (KENALOG-40) injection 40 mg, 40  mg, Other, Once, Mohammed Kindle, MD .  triamcinolone acetonide (KENALOG-40) injection 40 mg, 40 mg, Other, Once, Mohammed Kindle, MD .  triamcinolone acetonide (KENALOG-40) injection 40 mg, 40 mg, Other, Once, Mohammed Kindle, MD  Imaging Review   Cervical DG 2-3 views:  Results for orders placed during the hospital encounter of 05/18/07  DG Cervical Spine 2-3 Views   Narrative Clinical data:   herniated disk CERVICAL SPINE - 2 VIEW: Findings:  Anterior plates and screws are seen transfixing C6-C7 with an interpose tubular bone plug for anterior fusion.  There is reversal of lordosis.  Soft tissue fullness in the prevertebral region is noted consistent with postoperative change.  There is no breakage or loosening of the hardware.  IMPRESSION: Anterior discectomy and fusion at C6-7.  Provider: Margarita Mail     Complexity Note: Imaging results reviewed. Results shared with Ms. Pautler, using Layman's terms.                         ROS  Cardiovascular History: High blood pressure and Blood thinners:  Antiplatelet Pulmonary or Respiratory History: Lung problems, Smoking and Temporary stoppage of breathing during sleep Neurological History: No reported neurological signs or symptoms such as  seizures, abnormal skin sensations, urinary and/or fecal incontinence, being born with an abnormal open spine and/or a tethered spinal cord Review of Past Neurological Studies: No results found for this or any previous visit. Psychological-Psychiatric History: Anxiousness and Depressed Gastrointestinal History: Heartburn due to stomach pushing into lungs (Hiatal hernia) and Reflux or heatburn Genitourinary History: No reported renal or genitourinary signs or symptoms such as difficulty voiding or producing urine, peeing blood, non-functioning kidney, kidney stones, difficulty emptying the bladder, difficulty controlling the flow of urine, or chronic kidney disease Hematological History: Brusing easily Endocrine History: No reported endocrine signs or symptoms such as high or low blood sugar, rapid heart rate due to high thyroid levels, obesity or weight gain due to slow thyroid or thyroid disease Rheumatologic History: Joint aches and or swelling due to excess weight (Osteoarthritis) Musculoskeletal History: Negative for myasthenia gravis, muscular dystrophy, multiple sclerosis or malignant hyperthermia Work History: Disabled  Allergies  Ms. Lubrano has No Known Allergies.  Laboratory Chemistry  Inflammation Markers (CRP: Acute Phase) (ESR: Chronic Phase) No results found for: CRP, ESRSEDRATE, LATICACIDVEN               Rheumatology Markers Lab Results  Component Value Date   LABURIC 7.3 (H) 07/26/2015                Renal Function Markers Lab Results  Component Value Date   BUN 13 12/27/2017   CREATININE 1.08 (H) 12/27/2017   GFRAA 64 12/27/2017   GFRNONAA 55 (L) 12/27/2017                 Hepatic Function Markers Lab Results  Component Value Date   AST 20 12/27/2017   ALT 19 12/27/2017   ALBUMIN 4.1 12/27/2017   ALKPHOS 109 12/27/2017                 Electrolytes Lab Results  Component Value Date   NA 141 12/27/2017   K 4.5 12/27/2017   CL 103 12/27/2017   CALCIUM  9.3 12/27/2017                 Neuropathy Markers Lab Results  Component Value Date   HIV Non Reactive 07/26/2015  Bone Pathology Markers No results found for: Marveen Reeks, [id number], [id number], 25OHVITD1, 25OHVITD2, 25OHVITD3, TESTOFREE, TESTOSTERONE               Coagulation Parameters Lab Results  Component Value Date   INR 0.9 03/23/2014   LABPROT 12.5 03/23/2014   APTT 30 05/13/2007   PLT 313 12/27/2017                 Cardiovascular Markers Lab Results  Component Value Date   HGB 12.6 12/27/2017   HCT 39.2 12/27/2017                 CA Markers No results found for: CEA, CA125, LABCA2               Note: Lab results reviewed.  [place]  Drug: Ms. Bonano  reports that she does not use drugs. Alcohol:  reports that she does not drink alcohol. Tobacco:  reports that she has been smoking cigarettes.  She has a 46.00 pack-year smoking history. she has never used smokeless tobacco. Medical:  has a past medical history of Arthritis, COPD (chronic obstructive pulmonary disease) ([place]), Enlarged heart, GERD (gastroesophageal reflux disease), Hiatal hernia, degenerative disc disease, Hyperlipidemia, Hypertension, Mass, Osteoporosis, and Sleep apnea. Family: family history includes Breast cancer in her paternal aunt and paternal aunt; COPD in her father; Cancer in her brother and sister; Dementia in her father; Heart attack in her brother; Heart disease in her father; Hyperlipidemia in her father and sister; Hypertension in her father and sister; Lymphoma in her sister; Migraines in her son; Stroke in her maternal grandfather.  Past Surgical History:  Procedure Laterality Date  . ABDOMINAL HYSTERECTOMY    . ACHILLES TENDON REPAIR     Removed bone spur and repaired achilles tendon  . APPENDECTOMY    . bone fusion in neck    . HERNIA REPAIR     has not had repair  . TONSILLECTOMY     Active Ambulatory Problems    Diagnosis Date Noted  . Anxiety  05/14/2015  . Spasm 05/14/2015  . Panic disorder 05/14/2015  . Mood disorder of depressed type 05/14/2015  . DDD (degenerative disc disease), lumbar 05/14/2015  . COPD, severe ([place]) 05/14/2015  . Hyperlipidemia 05/14/2015  . Gastroesophageal reflux disease 05/14/2015  . Radiculopathy, lumbar region 05/14/2015  . Sleep apnea 05/14/2015  . Obesity 05/14/2015  . Benign essential tremor 05/14/2015  . Chronic pain 05/17/2015  . Facet syndrome, lumbar 07/11/2015  . Spinal stenosis, lumbar region, with neurogenic claudication 07/11/2015  . Status post cervical spinal fusion 07/11/2015  . DJD (degenerative joint disease) of knee 07/11/2015  . Tobacco abuse 07/26/2015  . Benign hypertension 10/28/2015  . Essential hypertension 12/01/2016  . Advanced care planning/counseling discussion 06/07/2017  . Osteoarthritis of right hip 12/27/2017   Resolved Ambulatory Problems    Diagnosis Date Noted  . Major depression 05/17/2015  . Major depression, chronic 07/01/2015  . Edema 12/20/2015  . Pedal edema 12/20/2015   Past Medical History:  Diagnosis Date  . Arthritis   . COPD (chronic obstructive pulmonary disease) ([place])   . Enlarged heart   . GERD (gastroesophageal reflux disease)   . Hiatal hernia   . Hx of degenerative disc disease   . Hyperlipidemia   . Hypertension   . Mass   . Osteoporosis   . Sleep apnea    Constitutional Exam  General appearance: Well nourished, well developed, and well hydrated. In no apparent acute distress Vitals:  01/05/18 1034  BP: (!) 126/50  Pulse: 89  Resp: 16  Temp: 98.1 F (36.7 C)  TempSrc: Oral  SpO2: 98%  Weight: 271 lb (122.9 kg)  Height: '5\' 3"'  (1.6 m)   BMI Assessment: Estimated body mass index is 48.01 kg/m as calculated from the following:   Height as of this encounter: '5\' 3"'  (1.6 m).   Weight as of this encounter: 271 lb (122.9 kg).  BMI interpretation table: BMI level Category Range association with higher incidence of chronic  pain  <18 kg/m2 Underweight   18.5-24.9 kg/m2 Ideal body weight   25-29.9 kg/m2 Overweight Increased incidence by 20%  30-34.9 kg/m2 Obese (Class I) Increased incidence by 68%  35-39.9 kg/m2 Severe obesity (Class II) Increased incidence by 136%  >40 kg/m2 Extreme obesity (Class III) Increased incidence by 254%   BMI Readings from Last 4 Encounters:  01/05/18 48.01 kg/m  12/27/17 49.28 kg/m  09/15/17 47.12 kg/m  09/07/17 47.62 kg/m   Wt Readings from Last 4 Encounters:  01/05/18 271 lb (122.9 kg)  12/27/17 278 lb 3.2 oz (126.2 kg)  09/15/17 266 lb (120.7 kg)  09/07/17 271 lb 6.4 oz (123.1 kg)  Psych/Mental status: Alert, oriented x 3 (person, place, & time)       Eyes: PERLA Respiratory: No evidence of acute respiratory distress  Cervical Spine Area Exam  Skin & Axial Inspection: Well healed scar from previous spine surgery detected Alignment: Symmetrical Functional ROM: Decreased ROM, bilaterally Stability: No instability detected Muscle Tone/Strength: Functionally intact. No obvious neuro-muscular anomalies detected. Sensory (Neurological): Articular pain pattern Palpation: Complains of area being tender to palpation              Upper Extremity (UE) Exam    Side: Right upper extremity  Side: Left upper extremity  Skin & Extremity Inspection: Skin color, temperature, and hair growth are WNL. No peripheral edema or cyanosis. No masses, redness, swelling, asymmetry, or associated skin lesions. No contractures.  Skin & Extremity Inspection: Skin color, temperature, and hair growth are WNL. No peripheral edema or cyanosis. No masses, redness, swelling, asymmetry, or associated skin lesions. No contractures.  Functional ROM: Unrestricted ROM          Functional ROM: Unrestricted ROM          Muscle Tone/Strength: Functionally intact. No obvious neuro-muscular anomalies detected.  Muscle Tone/Strength: Functionally intact. No obvious neuro-muscular anomalies detected.  Sensory  (Neurological): Unimpaired          Sensory (Neurological): Unimpaired          Palpation: No palpable anomalies              Palpation: No palpable anomalies              Specialized Test(s): Deferred         Specialized Test(s): Deferred          Thoracic Spine Area Exam  Skin & Axial Inspection: No masses, redness, or swelling Alignment: Symmetrical Functional ROM: Unrestricted ROM Stability: No instability detected Muscle Tone/Strength: Functionally intact. No obvious neuro-muscular anomalies detected. Sensory (Neurological): Unimpaired Muscle strength & Tone: No palpable anomalies  Lumbar Spine Area Exam  Skin & Axial Inspection: Lumbar Scoliosis Alignment: Symmetrical Functional ROM: Decreased ROM, bilaterally Stability: No instability detected Muscle Tone/Strength: Functionally intact. No obvious neuro-muscular anomalies detected. Sensory (Neurological): Dermatomal pain pattern Palpation: Complains of area being tender to palpation       Provocative Tests: Lumbar Hyperextension and rotation test: Positive bilaterally for facet  joint pain. Lumbar Lateral bending test: evaluation deferred today       Patrick's Maneuver: evaluation deferred today                    Gait & Posture Assessment  Ambulation: Limited Gait: Antalgic Posture: WNL   Lower Extremity Exam    Side: Right lower extremity  Side: Left lower extremity  Skin & Extremity Inspection: Skin color, temperature, and hair growth are WNL. No peripheral edema or cyanosis. No masses, redness, swelling, asymmetry, or associated skin lesions. No contractures.  Skin & Extremity Inspection: Skin color, temperature, and hair growth are WNL. No peripheral edema or cyanosis. No masses, redness, swelling, asymmetry, or associated skin lesions. No contractures.  Functional ROM: Unrestricted ROM          Functional ROM: Unrestricted ROM          Muscle Tone/Strength: Functionally intact. No obvious neuro-muscular anomalies  detected.  Muscle Tone/Strength: Functionally intact. No obvious neuro-muscular anomalies detected.  Sensory (Neurological): Unimpaired  Sensory (Neurological): Unimpaired  Palpation: No palpable anomalies  Palpation: No palpable anomalies   Assessment  Primary Diagnosis & Pertinent Problem List: The primary encounter diagnosis was Lumbar radiculopathy. Diagnoses of Facet syndrome, lumbar, DDD (degenerative disc disease), lumbar, Spinal stenosis, lumbar region, with neurogenic claudication, Primary osteoarthritis of both knees, Sacroiliac joint dysfunction, and Chronic pain syndrome were also pertinent to this visit.  Visit Diagnosis (New problems to examiner): 1. Lumbar radiculopathy   2. Facet syndrome, lumbar   3. DDD (degenerative disc disease), lumbar   4. Spinal stenosis, lumbar region, with neurogenic claudication   5. Primary osteoarthritis of both knees   6. Sacroiliac joint dysfunction   7. Chronic pain syndrome   General Recommendations: The pain condition that the patient suffers from is best treated with a multidisciplinary approach that involves an increase in physical activity to prevent de-conditioning and worsening of the pain cycle, as well as psychological counseling (formal and/or informal) to address the co-morbid psychological affects of pain. Treatment will often involve judicious use of pain medications and interventional procedures to decrease the pain, allowing the patient to participate in the physical activity that will ultimately produce long-lasting pain reductions. The goal of the multidisciplinary approach is to return the patient to a higher level of overall function and to restore their ability to perform activities of daily living.  62 year old female who presents with a chief complaint of axial low back pain that radiates into her right hip and then to her anterior thigh to her groin and then down to her knees.  Patient does have a history of a cervical  discectomy and fusion but she is not endorsing any significant neck pain.  Of note, she was previously a pain patient at this clinic in the past with Dr. Primus Bravo.  She follows up hoping to reestablish care here.  She describes numbness and tingling along her right hip and anterior thigh.  Her lumbar MRI is significant for L4-L5 disc extrusion on the left abutting the left anterior thecal sac and left-sided nerve roots along with moderate degenerative facet hypertrophy and moderate lateral recess foraminal stenosis.  She also has moderate degenerative facet changes right greater than left at L5-S1.  Patient is on gabapentin 800 mill grams 3 times daily, Soma 3 times a day, Cymbalta 60 mg daily.  She is not on any opioid therapy.  Last opioid prescription was for hydrocodone 10 mg, quantity 120, last filled 09/08/2016.  Patient will not  be a candidate for opioid therapy given her intake of Soma and its associated increased risk of respiratory depression and cardiorespiratory events when taken concomitantly with opioid medications.    We will focus on non-opioid analgesics given the patient's chronic use of Soma which she states will be difficult for her to discontinue.  For her lumbar radiculopathy, we discussed lumbar epidural steroid injection.  Risks and benefits were discussed.  Patient would like to proceed.  To assist with her neuropathic symptoms, we discussed amitriptyline 25 mg nightly for 3 weeks then 50 mill grams nightly thereafter if no side effects.  We will also obtain urine drug screen today.  Expect this to be positive for some and its metabolites.  Plan: -UDS today -Lumbar epidural steroid injection for lumbar radiculopathy -Amitriptyline as below for neuropathic pain -Continue all other medications as previously prescribed.  Recommended patient find an alternative to [facility] which can result in respiratory depression and sedation especially in patients with risk factors for obstructive sleep  apnea which this patient has. -We will focus on non-opioid analgesics given patient's morbid obesity, signs and symptoms of obstructive sleep apnea.  Note: Please be advised that as per protocol, today's visit has been an evaluation only. We have not taken over the patient's controlled substance management.   Ordered Lab-work, Procedure(s), Referral(s), & Consult(s): Orders Placed This Encounter  Procedures  . Lumbar Epidural Injection  . Compliance Drug Analysis, Ur   Pharmacotherapy (current): Medications ordered:  Meds ordered this encounter  Medications  . amitriptyline (ELAVIL) 25 MG tablet    Sig: 25 mg qhs x 3 weeks then 50 mg qhs    Dispense:  60 tablet    Refill:  1   Medications administered during this visit: Dania F. Moroney had no medications administered during this visit.   Pharmacological management options:  Opioid Analgesics: The patient was informed that there is no guarantee that she would be a candidate for opioid analgesics. The decision will be made following CDC guidelines. This decision will be based on the results of diagnostic studies, as well as Ms. Phegley's risk profile.   Membrane stabilizer: To be determined at a later time  Muscle relaxant: To be determined at a later time  NSAID: To be determined at a later time  Other analgesic(s): To be determined at a later time   Interventional management options: Ms. Dickard was informed that there is no guarantee that she would be a candidate for interventional therapies. The decision will be based on the results of diagnostic studies, as well as Ms. Beste's risk profile.  Procedure(s) under consideration:  -Lumbar epidural steroid injection -Lumbar facet medial branch nerve blocks, L3, L4, L5 -Bilateral SI joint injections -Bilateral genicular nerve block.   Provider-requested follow-up: Return in about 2 weeks (around 01/19/2018) for Procedure.  Future Appointments  Date Time Provider [place]  01/19/2018 10:45 AM Gillis Santa, MD [facility] None  03/28/2018  1:30 PM [name], NP [facility] [place]    Primary Care Physician: [name], NP Location: [facility] Note by: Gillis Santa, M.D, Date: 01/05/2018; Time: 3:02 PM  Patient Instructions  1. UDS today 2. Schedule for R- L ESI with sedation 3. Amitriptyline Rx  ____________________________________________________________________________________________  Risk(s) and Possible Complications  Patient Responsibilities: It is important that you read this as it is part of your informed consent. It is our duty to inform you of the risks and possible complications associated with treatments offered to you. It is  your responsibility as a patient to read this and to ask questions about anything that is not clear or that you believe was not covered in this document.  Patient's Rights: You have the right to refuse treatment. You also have the right to change your mind, even after initially having agreed to have the treatment done. However, under this last option, if you wait until the last second to change your mind, you may be charged for the materials used up to that point.  Introduction: Medicine is not an Chief Strategy Officer. Everything in Medicine, including the lack of treatment(s), carries the potential for danger, harm, or loss (which is by definition: Risk). In Medicine, a complication is a secondary problem, condition, or disease that can aggravate an already existing one. All treatments carry the risk of possible complications. The fact that a side effects or complications occurs, does not imply that the treatment was conducted incorrectly. It must be clearly understood that these can happen even when everything is done following the highest safety standards.  No treatment: You can choose not to proceed with the proposed treatment alternative. The "PRO(s)" would include: avoiding the risk of  complications associated with the therapy. The "CON(s)" would include: not getting any of the treatment benefits. These benefits fall under one of three categories: diagnostic; therapeutic; and/or palliative. Diagnostic benefits include: getting information which can ultimately lead to improvement of the disease or symptom(s). Therapeutic benefits are those associated with the successful treatment of the disease. Finally, palliative benefits are those related to the decrease of the primary symptoms, without necessarily curing the condition (example: decreasing the pain from a flare-up of a chronic condition, such as incurable terminal cancer).  General Risks and Complications: These are associated to most interventional treatments. They can occur alone, or in combination. They fall under one of the following six (6) categories: no benefit or worsening of symptoms; bleeding; infection; nerve damage; allergic reactions; and/or death. 1. No benefits or worsening of symptoms: In Medicine there are no guarantees, only probabilities. No healthcare provider can ever guarantee that a medical treatment will work, they can only state the probability that it may. Furthermore, there is always the possibility that the condition may worsen, either directly, or indirectly, as a consequence of the treatment. 2. Bleeding: This is more common if the patient is taking a blood thinner, either prescription or over the counter (example: Goody Powders, Fish oil, Aspirin, Garlic, etc.), or if suffering a condition associated with impaired coagulation (example: Hemophilia, cirrhosis of the liver, low platelet counts, etc.). However, even if you do not have one on these, it can still happen. If you have any of these conditions, or take one of these drugs, make sure to notify your treating physician. 3. Infection: This is more common in patients with a compromised immune system, either due to disease (example: diabetes, cancer, human  immunodeficiency virus [HIV], etc.), or due to medications or treatments (example: therapies used to treat cancer and rheumatological diseases). However, even if you do not have one on these, it can still happen. If you have any of these conditions, or take one of these drugs, make sure to notify your treating physician. 4. Nerve Damage: This is more common when the treatment is an invasive one, but it can also happen with the use of medications, such as those used in the treatment of cancer. The damage can occur to small secondary nerves, or to large primary ones, such as those in the spinal cord and  brain. This damage may be temporary or permanent and it may lead to impairments that can range from temporary numbness to permanent paralysis and/or brain death. 5. Allergic Reactions: Any time a substance or material comes in contact with our body, there is the possibility of an allergic reaction. These can range from a mild skin rash (contact dermatitis) to a severe systemic reaction (anaphylactic reaction), which can result in death. 6. Death: In general, any medical intervention can result in death, most of the time due to an unforeseen complication. ____________________________________________________________________________________________   Epidural Steroid Injection Patient Information  Description: The epidural space surrounds the nerves as they exit the spinal cord.  In some patients, the nerves can be compressed and inflamed by a bulging disc or a tight spinal canal (spinal stenosis).  By injecting steroids into the epidural space, we can bring irritated nerves into direct contact with a potentially helpful medication.  These steroids act directly on the irritated nerves and can reduce swelling and inflammation which often leads to decreased pain.  Epidural steroids may be injected anywhere along the spine and from the neck to the low back depending upon the location of your pain.   After numbing  the skin with local anesthetic (like Novocaine), a small needle is passed into the epidural space slowly.  You may experience a sensation of pressure while this is being done.  The entire block usually last less than 10 minutes.  Conditions which may be treated by epidural steroids:   Low back and leg pain  Neck and arm pain  Spinal stenosis  Post-laminectomy syndrome  Herpes zoster (shingles) pain  Pain from compression fractures  Preparation for the injection:  1. Do not eat any solid food or dairy products within 8 hours of your appointment.  2. You may drink clear liquids up to 3 hours before appointment.  Clear liquids include water, black coffee, juice or soda.  No milk or cream please. 3. You may take your regular medication, including pain medications, with a sip of water before your appointment  Diabetics should hold regular insulin (if taken separately) and take 1/2 normal NPH dos the morning of the procedure.  Carry some sugar containing items with you to your appointment. 4. A driver must accompany you and be prepared to drive you home after your procedure.  5. Bring all your current medications with your. 6. An IV may be inserted and sedation may be given at the discretion of the physician.   7. A blood pressure cuff, EKG and other monitors will often be applied during the procedure.  Some patients may need to have extra oxygen administered for a short period. 8. You will be asked to provide medical information, including your allergies, prior to the procedure.  We must know immediately if you are taking blood thinners (like Coumadin/Warfarin)  Or if you are allergic to IV iodine contrast (dye). We must know if you could possible be pregnant.  Possible side-effects:  Bleeding from needle site  Infection (rare, may require surgery)  Nerve injury (rare)  Numbness & tingling (temporary)  Difficulty urinating (rare, temporary)  Spinal headache ( a headache worse with  upright posture)  Light -headedness (temporary)  Pain at injection site (several days)  Decreased blood pressure (temporary)  Weakness in arm/leg (temporary)  Pressure sensation in back/neck (temporary)  Call if you experience:  Fever/chills associated with headache or increased back/neck pain.  Headache worsened by an upright position.  New onset weakness or numbness  of an extremity below the injection site  Hives or difficulty breathing (go to the emergency room)  Inflammation or drainage at the infection site  Severe back/neck pain  Any new symptoms which are concerning to you  Please note:  Although the local anesthetic injected can often make your back or neck feel good for several hours after the injection, the pain will likely return.  It takes 3-7 days for steroids to work in the epidural space.  You may not notice any pain relief for at least that one week.  If effective, we will often do a series of three injections spaced 3-6 weeks apart to maximally decrease your pain.  After the initial series, we generally will wait several months before considering a repeat injection of the same type.  If you have any questions, please call [phone] [facility]

## 2018-01-06 ENCOUNTER — Encounter: Payer: Self-pay | Admitting: Unknown Physician Specialty

## 2018-01-11 LAB — COMPLIANCE DRUG ANALYSIS, UR

## 2018-01-19 ENCOUNTER — Other Ambulatory Visit: Payer: Self-pay

## 2018-01-19 ENCOUNTER — Encounter: Payer: Self-pay | Admitting: Student in an Organized Health Care Education/Training Program

## 2018-01-19 ENCOUNTER — Ambulatory Visit
Admission: RE | Admit: 2018-01-19 | Discharge: 2018-01-19 | Disposition: A | Discharge status: Home / Self Care | Payer: BLUE CROSS/BLUE SHIELD | Source: Ambulatory Visit | Attending: Student in an Organized Health Care Education/Training Program | Admitting: Student in an Organized Health Care Education/Training Program

## 2018-01-19 ENCOUNTER — Ambulatory Visit (HOSPITAL_BASED_OUTPATIENT_CLINIC_OR_DEPARTMENT_OTHER): Payer: BLUE CROSS/BLUE SHIELD | Admitting: Student in an Organized Health Care Education/Training Program

## 2018-01-19 VITALS — BP 116/65 | HR 82 | Temp 98.5°F | Resp 16 | Ht 63.0 in | Wt 271.0 lb

## 2018-01-19 DIAGNOSIS — M545 Low back pain: Secondary | ICD-10-CM | POA: Diagnosis present

## 2018-01-19 DIAGNOSIS — G894 Chronic pain syndrome: Secondary | ICD-10-CM | POA: Insufficient documentation

## 2018-01-19 DIAGNOSIS — M5416 Radiculopathy, lumbar region: Secondary | ICD-10-CM | POA: Insufficient documentation

## 2018-01-19 DIAGNOSIS — M79604 Pain in right leg: Secondary | ICD-10-CM | POA: Diagnosis present

## 2018-01-19 MED ORDER — IOPAMIDOL (ISOVUE-M 200) INJECTION 41%
10.0000 mL | Freq: Once | INTRAMUSCULAR | Status: AC
Start: 2018-01-19 — End: 2018-01-19
  Administered 2018-01-19: 10 mL via EPIDURAL
  Filled 2018-01-19: qty 10

## 2018-01-19 MED ORDER — SODIUM CHLORIDE 0.9% FLUSH
2.0000 mL | Freq: Once | INTRAVENOUS | Status: AC
  Administered 2018-01-19: 2 mL

## 2018-01-19 MED ORDER — ROPIVACAINE HCL 2 MG/ML IJ SOLN
2.0000 mL | Freq: Once | INTRAMUSCULAR | Status: AC
  Administered 2018-01-19: 2 mL via EPIDURAL
  Filled 2018-01-19: qty 10

## 2018-01-19 MED ORDER — LIDOCAINE HCL (PF) 1 % IJ SOLN
4.5000 mL | Freq: Once | INTRAMUSCULAR | Status: AC
  Administered 2018-01-19: 4.5 mL
  Filled 2018-01-19: qty 5

## 2018-01-19 MED ORDER — DEXAMETHASONE SODIUM PHOSPHATE 10 MG/ML IJ SOLN
10.0000 mg | Freq: Once | INTRAMUSCULAR | Status: AC
  Administered 2018-01-19: 10 mg
  Filled 2018-01-19: qty 1

## 2018-01-19 MED ORDER — FENTANYL CITRATE (PF) 100 MCG/2ML IJ SOLN
25.0000 ug | INTRAMUSCULAR | Status: DC | PRN
  Administered 2018-01-19: 50 ug via INTRAVENOUS
  Filled 2018-01-19 (×2): qty 2

## 2018-01-19 NOTE — Patient Instructions (Signed)

## 2018-01-19 NOTE — Progress Notes (Signed)
Patient's Name: [name]  MRN: [id number]  Referring Provider: [name], NP  DOB: 12/20/1955  PCP: [name], NP  DOS: 01/19/2018  Note by: Gillis Santa, MD  Service setting: Ambulatory outpatient  Specialty: Interventional Pain Management  Patient type: Established  Location: [facility] (AMB) Pain Management Facility  Visit type: Interventional Procedure   Primary Reason for Visit: Interventional Pain Management Treatment. CC: Back Pain (lower, right) and Leg Pain (right)  Procedure:       Anesthesia, Analgesia, Anxiolysis:  Type: Therapeutic Inter-Laminar Epidural Steroid Injection #1  Region: Lumbar Level: L4-5 Level. Laterality: Right-Sided         Type: Local Anesthesia with Moderate (Conscious) Sedation Local Anesthetic: Lidocaine 1% Route: Intravenous (IV) IV Access: Secured Sedation: Meaningful verbal contact was maintained at all times during the procedure  Indication(s): Analgesia and Anxiety   Indications: 1. Lumbar radiculopathy   2. Chronic pain syndrome    Pain Score: Pre-procedure: 9 /10 Post-procedure: 0-No pain/10  Pre-op Assessment:  [name] is a 62 y.o. (year old), female patient, seen today for interventional treatment. She  has a past surgical history that includes Appendectomy; Tonsillectomy; Abdominal hysterectomy; bone fusion in neck; Achilles tendon repair; and Hernia repair. [name] has a current medication list which includes the following prescription(s): amitriptyline, aspirin, bupropion, buspirone, calcium citrate-vitamin d, carisoprodol, duloxetine, furosemide, gabapentin, hydroxyzine, ibuprofen, lovastatin, multivitamin with minerals, omeprazole, potassium chloride sa, proair hfa, telmisartan-hydrochlorothiazide, and tiotropium, and the following Facility-Administered Medications: bupivacaine (pf), bupivacaine (pf), bupivacaine (pf), bupivacaine (pf), cefazolin, cefazolin, cefazolin, fentanyl, fentanyl, fentanyl, lactated ringers,  lactated ringers, lactated ringers, lactated ringers, lidocaine (pf), lidocaine (pf), midazolam, midazolam, orphenadrine, orphenadrine, orphenadrine, sodium chloride flush, sodium chloride flush, triamcinolone acetonide, triamcinolone acetonide, and triamcinolone acetonide. Her primarily concern today is the Back Pain (lower, right) and Leg Pain (right)  Initial Vital Signs:  Pulse Rate: 89 Temp: 98.5 F (36.9 C) Resp: 16 BP: (!) 123/51 SpO2: 98 %  BMI: Estimated body mass index is 48.01 kg/m as calculated from the following:   Height as of this encounter: 5\' 3"  (1.6 m).   Weight as of this encounter: 271 lb (122.9 kg).  Risk Assessment: Allergies: Reviewed. She has No Known Allergies.  Allergy Precautions: None required Coagulopathies: Reviewed. None identified.  Blood-thinner therapy: None at this time Active Infection(s): Reviewed. None identified. [name] is afebrile  Site Confirmation: [name] was asked to confirm the procedure and laterality before marking the site Procedure checklist: Completed Consent: Before the procedure and under the influence of no sedative(s), amnesic(s), or anxiolytics, the patient was informed of the treatment options, risks and possible complications. To fulfill our ethical and legal obligations, as recommended by the American Medical Association's Code of Ethics, I have informed the patient of my clinical impression; the nature and purpose of the treatment or procedure; the risks, benefits, and possible complications of the intervention; the alternatives, including doing nothing; the risk(s) and benefit(s) of the alternative treatment(s) or procedure(s); and the risk(s) and benefit(s) of doing nothing. The patient was provided information about the general risks and possible complications associated with the procedure. These may include, but are not limited to: failure to achieve desired goals, infection, bleeding, organ or nerve damage, allergic  reactions, paralysis, and death. In addition, the patient was informed of those risks and complications associated to Spine-related procedures, such as failure to decrease pain; infection (i.e.: Meningitis, epidural or intraspinal abscess); bleeding (i.e.: epidural hematoma, subarachnoid hemorrhage, or any other type of intraspinal or peri-dural  bleeding); organ or nerve damage (i.e.: Any type of peripheral nerve, nerve root, or spinal cord injury) with subsequent damage to sensory, motor, and/or autonomic systems, resulting in permanent pain, numbness, and/or weakness of one or several areas of the body; allergic reactions; (i.e.: anaphylactic reaction); and/or death. Furthermore, the patient was informed of those risks and complications associated with the medications. These include, but are not limited to: allergic reactions (i.e.: anaphylactic or anaphylactoid reaction(s)); adrenal axis suppression; blood sugar elevation that in diabetics may result in ketoacidosis or comma; water retention that in patients with history of congestive heart failure may result in shortness of breath, pulmonary edema, and decompensation with resultant heart failure; weight gain; swelling or edema; medication-induced neural toxicity; particulate matter embolism and blood vessel occlusion with resultant organ, and/or nervous system infarction; and/or aseptic necrosis of one or more joints. Finally, the patient was informed that Medicine is not an exact science; therefore, there is also the possibility of unforeseen or unpredictable risks and/or possible complications that may result in a catastrophic outcome. The patient indicated having understood very clearly. We have given the patient no guarantees and we have made no promises. Enough time was given to the patient to ask questions, all of which were answered to the patient's satisfaction. [name] has indicated that she wanted to continue with the procedure. Attestation: I,  the ordering provider, attest that I have discussed with the patient the benefits, risks, side-effects, alternatives, likelihood of achieving goals, and potential problems during recovery for the procedure that I have provided informed consent. Date  Time: 01/19/2018 10:39 AM  Pre-Procedure Preparation:  Monitoring: As per clinic protocol. Respiration, ETCO2, SpO2, BP, heart rate and rhythm monitor placed and checked for adequate function Safety Precautions: Patient was assessed for positional comfort and pressure points before starting the procedure. Time-out: I initiated and conducted the "Time-out" before starting the procedure, as per protocol. The patient was asked to participate by confirming the accuracy of the "Time Out" information. Verification of the correct person, site, and procedure were performed and confirmed by me, the nursing staff, and the patient. "Time-out" conducted as per Joint Commission's Universal Protocol (UP.01.01.01). Time: 1124  Description of Procedure:       Position: Prone with head of the table was raised to facilitate breathing. Target Area: The interlaminar space, initially targeting the lower laminar border of the superior vertebral body. Approach: Paramedial approach. Area Prepped: Entire Posterior Lumbar Region Prepping solution: ChloraPrep (2% chlorhexidine gluconate and 70% isopropyl alcohol) Safety Precautions: Aspiration looking for blood return was conducted prior to all injections. At no point did we inject any substances, as a needle was being advanced. No attempts were made at seeking any paresthesias. Safe injection practices and needle disposal techniques used. Medications properly checked for expiration dates. SDV (single dose vial) medications used. Description of the Procedure: Protocol guidelines were followed. The procedure needle was introduced through the skin, ipsilateral to the reported pain, and advanced to the target area. Bone was contacted  and the needle walked caudad, until the lamina was cleared. The epidural space was identified using "loss-of-resistance technique" with 2-3 ml of PF-NaCl (0.9% NSS), in a 5cc LOR glass syringe. Vitals:   01/19/18 1135 01/19/18 1140 01/19/18 1150 01/19/18 1202  BP: 126/62 122/63 (!) 110/45 116/65  Pulse: 80 80 82   Resp: 18 17 16 16   Temp:      TempSrc:      SpO2: 97% 96% 96% 96%  Weight:  Height:        Start Time: 1124 hrs. End Time: 1138 hrs. Materials:  Needle(s) Type: Epidural needle Gauge: 17G Length: 5-in Medication(s): Please see orders for medications and dosing details. 9 cc solution made of 6 cc of preservative-free saline, 2 cc of 0.2% ropivacaine, 1 cc of Decadron 10 mg/cc. Imaging Guidance (Spinal):  Type of Imaging Technique: Fluoroscopy Guidance (Spinal) Indication(s): Assistance in needle guidance and placement for procedures requiring needle placement in or near specific anatomical locations not easily accessible without such assistance. Exposure Time: Please see nurses notes. Contrast: Before injecting any contrast, we confirmed that the patient did not have an allergy to iodine, shellfish, or radiological contrast. Once satisfactory needle placement was completed at the desired level, radiological contrast was injected. Contrast injected under live fluoroscopy. No contrast complications. See chart for type and volume of contrast used. Fluoroscopic Guidance: I was personally present during the use of fluoroscopy. "Tunnel Vision Technique" used to obtain the best possible view of the target area. Parallax error corrected before commencing the procedure. "Direction-depth-direction" technique used to introduce the needle under continuous pulsed fluoroscopy. Once target was reached, antero-posterior, oblique, and lateral fluoroscopic projection used confirm needle placement in all planes. Images permanently stored in EMR. Interpretation: I personally interpreted the  imaging intraoperatively. Adequate needle placement confirmed in multiple planes. Appropriate spread of contrast into desired area was observed. No evidence of afferent or efferent intravascular uptake. No intrathecal or subarachnoid spread observed. Permanent images saved into the patient's record.  Antibiotic Prophylaxis:   Anti-infectives (From admission, onward)   None     Indication(s): None identified  Post-operative Assessment:  Post-procedure Vital Signs:  Pulse Rate: 82 Temp: 98.5 F (36.9 C) Resp: 16 BP: 116/65 SpO2: 96 %  EBL: None  Complications: No immediate post-treatment complications observed by team, or reported by patient.  Note: The patient tolerated the entire procedure well. A repeat set of vitals were taken after the procedure and the patient was kept under observation following institutional policy, for this type of procedure. Post-procedural neurological assessment was performed, showing return to baseline, prior to discharge. The patient was provided with post-procedure discharge instructions, including a section on how to identify potential problems. Should any problems arise concerning this procedure, the patient was given instructions to immediately contact us, at any time, without hesitation. In any case, we plan to contact the patient by telephone for a follow-up status report regarding this interventional procedure.  Comments:  No additional relevant information. 5 out of 5 strength bilateral lower extremity: Plantar flexion, dorsiflexion, knee flexion, knee extension.  Plan of Care    Imaging Orders     DG C-Arm 1-60 Min-No Report Procedure Orders    No procedure(s) ordered today    Medications ordered for procedure: Meds ordered this encounter  Medications  . iopamidol (ISOVUE-M) 41 % intrathecal injection 10 mL  . ropivacaine (PF) 2 mg/mL (0.2%) (NAROPIN) injection 2 mL  . sodium chloride flush (NS) 0.9 % injection 2 mL  . lidocaine (PF)  (XYLOCAINE) 1 % injection 4.5 mL  . dexamethasone (DECADRON) injection 10 mg  . fentaNYL (SUBLIMAZE) injection 25-100 mcg    Make sure Narcan is available in the pyxis when using this medication. In the event of respiratory depression (RR< 8/min): Titrate NARCAN (naloxone) in increments of 0.1 to 0.2 mg IV at 2-3 minute intervals, until desired degree of reversal.   Medications administered: We administered iopamidol, ropivacaine (PF) 2 mg/mL (0.2%), sodium chloride flush, lidocaine (PF),  dexamethasone, and fentaNYL.  See the medical record for exact dosing, route, and time of administration.  New Prescriptions   No medications on file   Disposition: Discharge home  Discharge Date & Time: 01/19/2018; 1207 hrs.   Physician-requested Follow-up: Return in about 4 weeks (around 02/16/2018) for Post Procedure Evaluation.  Future Appointments  Date Time Provider [place]  03/28/2018  1:30 PM [name], NP [facility]   Primary Care Physician: [name], NP Location: [facility] Note by: Gillis Santa, MD Date: 01/19/2018; Time: 12:43 PM  Disclaimer:  Medicine is not an exact science. The only guarantee in medicine is that nothing is guaranteed. It is important to note that the decision to proceed with this intervention was based on the information collected from the patient. The Data and conclusions were drawn from the patient's questionnaire, the interview, and the physical examination. Because the information was provided in large part by the patient, it cannot be guaranteed that it has not been purposely or unconsciously manipulated. Every effort has been made to obtain as much relevant data as possible for this evaluation. It is important to note that the conclusions that lead to this procedure are derived in large part from the available data. Always take into account that the treatment will also be dependent on availability of resources and  existing treatment guidelines, considered by other Pain Management Practitioners as being common knowledge and practice, at the time of the intervention. For Medico-Legal purposes, it is also important to point out that variation in procedural techniques and pharmacological choices are the acceptable norm. The indications, contraindications, technique, and results of the above procedure should only be interpreted and judged by a Board-Certified Interventional Pain Specialist with extensive familiarity and expertise in the same exact procedure and technique.

## 2018-01-19 NOTE — Progress Notes (Signed)
Safety precautions to be maintained throughout the outpatient stay will include: orient to surroundings, keep bed in low position, maintain call bell within reach at all times, provide assistance with transfer out of bed and ambulation.  

## 2018-01-20 ENCOUNTER — Telehealth: Payer: Self-pay

## 2018-01-20 NOTE — Telephone Encounter (Signed)
Denies any needs at this time. Instructed to call if needed. 

## 2018-01-29 ENCOUNTER — Other Ambulatory Visit: Payer: Self-pay | Admitting: Unknown Physician Specialty

## 2018-02-17 ENCOUNTER — Encounter: Payer: Self-pay | Admitting: Student in an Organized Health Care Education/Training Program

## 2018-02-17 ENCOUNTER — Ambulatory Visit
Payer: BLUE CROSS/BLUE SHIELD | Attending: Student in an Organized Health Care Education/Training Program | Admitting: Student in an Organized Health Care Education/Training Program

## 2018-02-17 ENCOUNTER — Other Ambulatory Visit: Payer: Self-pay

## 2018-02-17 VITALS — BP 124/49 | HR 86 | Temp 99.1°F | Resp 20 | Ht 63.0 in | Wt 263.0 lb

## 2018-02-17 DIAGNOSIS — M170 Bilateral primary osteoarthritis of knee: Secondary | ICD-10-CM | POA: Diagnosis not present

## 2018-02-17 DIAGNOSIS — Z7982 Long term (current) use of aspirin: Secondary | ICD-10-CM | POA: Diagnosis not present

## 2018-02-17 DIAGNOSIS — M25561 Pain in right knee: Secondary | ICD-10-CM | POA: Insufficient documentation

## 2018-02-17 DIAGNOSIS — G894 Chronic pain syndrome: Secondary | ICD-10-CM | POA: Insufficient documentation

## 2018-02-17 DIAGNOSIS — M5116 Intervertebral disc disorders with radiculopathy, lumbar region: Secondary | ICD-10-CM | POA: Diagnosis not present

## 2018-02-17 DIAGNOSIS — E785 Hyperlipidemia, unspecified: Secondary | ICD-10-CM | POA: Insufficient documentation

## 2018-02-17 DIAGNOSIS — J449 Chronic obstructive pulmonary disease, unspecified: Secondary | ICD-10-CM | POA: Insufficient documentation

## 2018-02-17 DIAGNOSIS — Z9889 Other specified postprocedural states: Secondary | ICD-10-CM | POA: Diagnosis not present

## 2018-02-17 DIAGNOSIS — F419 Anxiety disorder, unspecified: Secondary | ICD-10-CM | POA: Insufficient documentation

## 2018-02-17 DIAGNOSIS — M48062 Spinal stenosis, lumbar region with neurogenic claudication: Secondary | ICD-10-CM | POA: Insufficient documentation

## 2018-02-17 DIAGNOSIS — Z90710 Acquired absence of both cervix and uterus: Secondary | ICD-10-CM | POA: Insufficient documentation

## 2018-02-17 DIAGNOSIS — M5136 Other intervertebral disc degeneration, lumbar region: Secondary | ICD-10-CM | POA: Diagnosis not present

## 2018-02-17 DIAGNOSIS — M25551 Pain in right hip: Secondary | ICD-10-CM | POA: Insufficient documentation

## 2018-02-17 DIAGNOSIS — M5416 Radiculopathy, lumbar region: Secondary | ICD-10-CM

## 2018-02-17 DIAGNOSIS — Z79899 Other long term (current) drug therapy: Secondary | ICD-10-CM | POA: Insufficient documentation

## 2018-02-17 DIAGNOSIS — F17210 Nicotine dependence, cigarettes, uncomplicated: Secondary | ICD-10-CM | POA: Insufficient documentation

## 2018-02-17 DIAGNOSIS — I10 Essential (primary) hypertension: Secondary | ICD-10-CM | POA: Insufficient documentation

## 2018-02-17 DIAGNOSIS — K219 Gastro-esophageal reflux disease without esophagitis: Secondary | ICD-10-CM | POA: Insufficient documentation

## 2018-02-17 NOTE — Progress Notes (Signed)
Safety precautions to be maintained throughout the outpatient stay will include: orient to surroundings, keep bed in low position, maintain call bell within reach at all times, provide assistance with transfer out of bed and ambulation.  

## 2018-02-17 NOTE — Progress Notes (Signed)
Patient's Name: [name]  MRN: [id number]  Referring Provider: [name], NP  DOB: [birth year]  PCP: [name], NP  DOS: 02/17/2018  Note by: Gillis Santa, MD  Service setting: Ambulatory outpatient  Specialty: Interventional Pain Management  Location: [facility] (AMB) Pain Management Facility    Patient type: Established   Primary Reason(s) for Visit: Encounter for post-procedure evaluation of chronic illness with mild to moderate exacerbation CC: Knee Pain (right) and Hip Pain (right)  HPI  [name] is a 62 y.o. year old, female patient, who comes today for a post-procedure evaluation. She has Anxiety; Spasm; Panic disorder; Mood disorder of depressed type; DDD (degenerative disc disease), lumbar; COPD, severe ([place]); Hyperlipidemia; Gastroesophageal reflux disease; Radiculopathy, lumbar region; Sleep apnea; Obesity; Benign essential tremor; Chronic pain; Facet syndrome, lumbar; Spinal stenosis, lumbar region, with neurogenic claudication; Status post cervical spinal fusion; DJD (degenerative joint disease) of knee; Tobacco abuse; Benign hypertension; Essential hypertension; Advanced care planning/counseling discussion; and Osteoarthritis of right hip on their problem list. Her primarily concern today is the Knee Pain (right) and Hip Pain (right)  Pain Assessment: Location: Right Knee(hip) Radiating: denies Onset: More than a month ago Duration: Chronic pain Quality: Constant, Sharp Severity: 9 /10 (self-reported pain score)  Note: Reported level is compatible with observation.                         When using our objective Pain Scale, levels between 6 and 10/10 are said to belong in an emergency room, as it progressively worsens from a 6/10, described as severely limiting, requiring emergency care not usually available at an outpatient pain management facility. At a 6/10 level, communication becomes difficult and requires great effort. Assistance to reach the emergency department  may be required. Facial flushing and profuse sweating along with potentially dangerous increases in heart rate and blood pressure will be evident. Effect on ADL:   Timing: Constant Modifying factors: sitting  [name] comes in today for post-procedure evaluation after the treatment done on 01/19/2018.  Further details on both, my assessment(s), as well as the proposed treatment plan, please see below.  Post-Procedure Assessment  01/19/2018 Procedure: RIGHT L4-L5 ESI #1 Pre-procedure pain score:  9/10 Post-procedure pain score: 0/10         Influential Factors: BMI: 46.59 kg/m Intra-procedural challenges: None observed.         Assessment challenges: None detected.              Reported side-effects: None.        Post-procedural adverse reactions or complications: None reported         Sedation: Please see nurses note. When no sedatives are used, the analgesic levels obtained are directly associated to the effectiveness of the local anesthetics. However, when sedation is provided, the level of analgesia obtained during the initial 1 hour following the intervention, is believed to be the result of a combination of factors. These factors may include, but are not limited to: 1. The effectiveness of the local anesthetics used. 2. The effects of the analgesic(s) and/or anxiolytic(s) used. 3. The degree of discomfort experienced by the patient at the time of the procedure. 4. The patients ability and reliability in recalling and recording the events. 5. The presence and influence of possible secondary gains and/or psychosocial factors. Reported result: Relief experienced during the 1st hour after the procedure: 100 % (Ultra-Short Term Relief)  Interpretative annotation: Clinically appropriate result. Analgesia during this period is likely to be Local Anesthetic and/or IV Sedative (Analgesic/Anxiolytic) related.          Effects of local anesthetic: The analgesic effects attained  during this period are directly associated to the localized infiltration of local anesthetics and therefore cary significant diagnostic value as to the etiological location, or anatomical origin, of the pain. Expected duration of relief is directly dependent on the pharmacodynamics of the local anesthetic used. Long-acting (4-6 hours) anesthetics used.  Reported result: Relief during the next 4 to 6 hour after the procedure: 50 % (Short-Term Relief)            Interpretative annotation: Clinically appropriate result. Analgesia during this period is likely to be Local Anesthetic-related.          Long-term benefit: Defined as the period of time past the expected duration of local anesthetics (1 hour for short-acting and 4-6 hours for long-acting). With the possible exception of prolonged sympathetic blockade from the local anesthetics, benefits during this period are typically attributed to, or associated with, other factors such as analgesic sensory neuropraxia, antiinflammatory effects, or beneficial biochemical changes provided by agents other than the local anesthetics.  Reported result: Extended relief following procedure: 50 % (Long-Term Relief)            Interpretative annotation: Clinically appropriate result. Good relief. No permanent benefit expected. Inflammation plays a part in the etiology to the pain.          Current benefits: Defined as reported results that persistent at this point in time.   Analgesia: 25-50 %            Function: Somewhat improved ROM: Somewhat improved Interpretative annotation: Recurrence of symptoms. No permanent benefit expected. Effective diagnostic intervention.          Interpretation: Results would suggest a successful diagnostic intervention. We'll proceed with diagnostic intervention #2, as soon as convenient          Plan:  Please see "Plan of Care" for details.                Laboratory Chemistry  Inflammation Markers (CRP: Acute Phase) (ESR:  Chronic Phase) No results found for: CRP, ESRSEDRATE, LATICACIDVEN                       Rheumatology Markers Lab Results  Component Value Date   LABURIC 7.3 (H) 07/26/2015                Renal Function Markers Lab Results  Component Value Date   BUN 13 12/27/2017   CREATININE 1.08 (H) 12/27/2017   GFRAA 64 12/27/2017   GFRNONAA 55 (L) 12/27/2017                 Hepatic Function Markers Lab Results  Component Value Date   AST 20 12/27/2017   ALT 19 12/27/2017   ALBUMIN 4.1 12/27/2017   ALKPHOS 109 12/27/2017                 Electrolytes Lab Results  Component Value Date   NA 141 12/27/2017   K 4.5 12/27/2017   CL 103 12/27/2017   CALCIUM 9.3 12/27/2017                        Neuropathy Markers Lab Results  Component Value Date   HIV Non Reactive 07/26/2015  Bone Pathology Markers No results found for: Marveen Reeks, [id number], IR4431VQ0, 25OHVITD1, 25OHVITD2, 25OHVITD3, TESTOFREE, TESTOSTERONE                       Coagulation Parameters Lab Results  Component Value Date   INR 0.9 03/23/2014   LABPROT 12.5 03/23/2014   APTT 30 05/13/2007   PLT 313 12/27/2017                 Cardiovascular Markers Lab Results  Component Value Date   HGB 12.6 12/27/2017   HCT 39.2 12/27/2017                 CA Markers No results found for: CEA, CA125, LABCA2               Note: Lab results reviewed.   Meds   Current Outpatient Medications:  .  amitriptyline (ELAVIL) 25 MG tablet, 25 mg qhs x 3 weeks then 50 mg qhs, Disp: 60 tablet, Rfl: 1 .  aspirin 81 MG tablet, Take 81 mg by mouth daily., Disp: , Rfl:  .  buPROPion (WELLBUTRIN XL) 300 MG 24 hr tablet, Take 1 tablet (300 mg total) by mouth daily., Disp: 90 tablet, Rfl: 1 .  busPIRone (BUSPAR) 10 MG tablet, TAKE 1 TABLET BY MOUTH TWICE DAILY, Disp: 180 tablet, Rfl: 0 .  Calcium Citrate-Vitamin D (CALCIUM + D PO), Take 1,000 mg by mouth daily., Disp: , Rfl:  .  carisoprodol (SOMA) 350 MG  tablet, Take 1 tablet (350 mg total) by mouth 3 (three) times daily., Disp: 90 tablet, Rfl: 5 .  DULoxetine (CYMBALTA) 60 MG capsule, Take 1 capsule (60 mg total) by mouth daily., Disp: 90 capsule, Rfl: 3 .  furosemide (LASIX) 20 MG tablet, Take 20 mg by mouth daily., Disp: , Rfl:  .  gabapentin (NEURONTIN) 800 MG tablet, TAKE 1 TABLET BY MOUTH THREE TIMES DAILY, Disp: 360 tablet, Rfl: 0 .  hydrOXYzine (ATARAX/VISTARIL) 10 MG tablet, TAKE 1 TABLET BY MOUTH THREE TIMES DAILY AS NEEDED, Disp: 90 tablet, Rfl: 1 .  ibuprofen (ADVIL,MOTRIN) 200 MG tablet, Take 800 mg by mouth every 4 (four) hours as needed., Disp: , Rfl:  .  lovastatin (MEVACOR) 40 MG tablet, Take 1 tablet (40 mg total) by mouth at bedtime., Disp: 90 tablet, Rfl: 1 .  Multiple Vitamins-Minerals (MULTIVITAMIN WITH MINERALS) tablet, Take 1 tablet by mouth daily., Disp: , Rfl:  .  omeprazole (PRILOSEC) 40 MG capsule, Take 1 capsule (40 mg total) by mouth daily., Disp: 90 capsule, Rfl: 3 .  potassium chloride SA (KLOR-CON M20) 20 MEQ tablet, Take 1 tablet (20 mEq total) by mouth daily., Disp: 90 tablet, Rfl: 3 .  PROAIR HFA 108 (90 Base) MCG/ACT inhaler, INHALE 2 PUFFS BY MOUTH EVERY 6 HOURS, Disp: 8.5 g, Rfl: 0 .  telmisartan-hydrochlorothiazide (MICARDIS HCT) 40-12.5 MG tablet, TAKE 1 TABLET BY MOUTH ONCE DAILY, Disp: 30 tablet, Rfl: 6 .  tiotropium (SPIRIVA HANDIHALER) 18 MCG inhalation capsule, Place 1 capsule (18 mcg total) into inhaler and inhale daily., Disp: 90 capsule, Rfl: 3  Current Facility-Administered Medications:  .  bupivacaine (PF) (MARCAINE) 0.25 % injection 30 mL, 30 mL, Other, Once, Mohammed Kindle, MD .  bupivacaine (PF) (MARCAINE) 0.25 % injection 30 mL, 30 mL, Other, Once, Mohammed Kindle, MD .  bupivacaine (PF) (MARCAINE) 0.25 % injection 30 mL, 30 mL, Other, Once, Mohammed Kindle, MD .  bupivacaine (PF) (MARCAINE) 0.25 % injection 30 mL, 30 mL, Other,  Once, Mohammed Kindle, MD .  ceFAZolin (ANCEF) IVPB 1 g/50 mL  premix, 1 g, Intravenous, Once, Mohammed Kindle, MD .  ceFAZolin (ANCEF) IVPB 1 g/50 mL premix, 1 g, Intravenous, Once, Mohammed Kindle, MD .  ceFAZolin (ANCEF) IVPB 1 g/50 mL premix, 1 g, Intravenous, Once, Mohammed Kindle, MD .  fentaNYL (SUBLIMAZE) injection 100 mcg, 100 mcg, Intravenous, Once, Mohammed Kindle, MD .  fentaNYL (SUBLIMAZE) injection 100 mcg, 100 mcg, Intravenous, Once, Mohammed Kindle, MD .  lactated ringers infusion 1,000 mL, 1,000 mL, Intravenous, Continuous, Mohammed Kindle, MD .  lactated ringers infusion 1,000 mL, 1,000 mL, Intravenous, Continuous, Mohammed Kindle, MD .  lactated ringers infusion 1,000 mL, 1,000 mL, Intravenous, Continuous, Mohammed Kindle, MD .  lactated ringers infusion 1,000 mL, 1,000 mL, Intravenous, Continuous, Mohammed Kindle, MD .  lidocaine (PF) (XYLOCAINE) 1 % injection 10 mL, 10 mL, Subcutaneous, Once, Mohammed Kindle, MD .  lidocaine (PF) (XYLOCAINE) 1 % injection 10 mL, 10 mL, Subcutaneous, Once, Mohammed Kindle, MD .  midazolam (VERSED) 5 MG/5ML injection 5 mg, 5 mg, Intravenous, Once, Mohammed Kindle, MD .  midazolam (VERSED) 5 MG/5ML injection 5 mg, 5 mg, Intravenous, Once, Mohammed Kindle, MD .  orphenadrine (NORFLEX) injection 60 mg, 60 mg, Intramuscular, Once, Mohammed Kindle, MD .  orphenadrine (NORFLEX) injection 60 mg, 60 mg, Intramuscular, Once, Mohammed Kindle, MD .  orphenadrine (NORFLEX) injection 60 mg, 60 mg, Intramuscular, Once, Mohammed Kindle, MD .  sodium chloride flush (NS) 0.9 % injection 20 mL, 20 mL, Other, Once, Mohammed Kindle, MD .  sodium chloride flush (NS) 0.9 % injection 20 mL, 20 mL, Other, Once, Mohammed Kindle, MD .  triamcinolone acetonide (KENALOG-40) injection 40 mg, 40 mg, Other, Once, Mohammed Kindle, MD .  triamcinolone acetonide (KENALOG-40) injection 40 mg, 40 mg, Other, Once, Mohammed Kindle, MD .  triamcinolone acetonide (KENALOG-40) injection 40 mg, 40 mg, Other, Once, Mohammed Kindle, MD  ROS  Constitutional: Denies  any fever or chills Gastrointestinal: No reported hemesis, hematochezia, vomiting, or acute GI distress Musculoskeletal: Denies any acute onset joint swelling, redness, loss of ROM, or weakness Neurological: No reported episodes of acute onset apraxia, aphasia, dysarthria, agnosia, amnesia, paralysis, loss of coordination, or loss of consciousness  Allergies  [name] has No Known Allergies.  [place]  Drug: [name]  reports that she does not use drugs. Alcohol:  reports that she does not drink alcohol. Tobacco:  reports that she has been smoking cigarettes.  She has a 46.00 pack-year smoking history. She has never used smokeless tobacco. Medical:  has a past medical history of Arthritis, COPD (chronic obstructive pulmonary disease) ([place]), Enlarged heart, GERD (gastroesophageal reflux disease), Hiatal hernia, degenerative disc disease, Hyperlipidemia, Hypertension, Mass, Osteoporosis, and Sleep apnea. Surgical: [name]  has a past surgical history that includes Appendectomy; Tonsillectomy; Abdominal hysterectomy; bone fusion in neck; Achilles tendon repair; and Hernia repair. Family: family history includes Breast cancer in her paternal aunt and paternal aunt; COPD in her father; Cancer in her brother and sister; Dementia in her father; Heart attack in her brother; Heart disease in her father; Hyperlipidemia in her father and sister; Hypertension in her father and sister; Lymphoma in her sister; Migraines in her son; Stroke in her maternal grandfather.  Constitutional Exam  General appearance: Well nourished, well developed, and well hydrated. In no apparent acute distress Vitals:   02/17/18 1318  BP: (!) 124/49  Pulse: 86  Resp: 20  Temp: 99.1 F (37.3 C)  TempSrc: Oral  SpO2: 100%  Weight: 263 lb (119.3 kg)  Height: 5' 3" (1.6 m)   BMI Assessment: Estimated body mass index is 46.59 kg/m as calculated from the following:   Height as of this encounter: 5' 3" (1.6 m).   Weight  as of this encounter: 263 lb (119.3 kg).  BMI interpretation table: BMI level Category Range association with higher incidence of chronic pain  <18 kg/m2 Underweight   18.5-24.9 kg/m2 Ideal body weight   25-29.9 kg/m2 Overweight Increased incidence by 20%  30-34.9 kg/m2 Obese (Class I) Increased incidence by 68%  35-39.9 kg/m2 Severe obesity (Class II) Increased incidence by 136%  >40 kg/m2 Extreme obesity (Class III) Increased incidence by 254%   BMI Readings from Last 4 Encounters:  02/17/18 46.59 kg/m  01/19/18 48.01 kg/m  01/05/18 48.01 kg/m  12/27/17 49.28 kg/m   Wt Readings from Last 4 Encounters:  02/17/18 263 lb (119.3 kg)  01/19/18 271 lb (122.9 kg)  01/05/18 271 lb (122.9 kg)  12/27/17 278 lb 3.2 oz (126.2 kg)  Psych/Mental status: Alert, oriented x 3 (person, place, & time)       Eyes: PERLA Respiratory: No evidence of acute respiratory distress  Cervical Spine Area Exam  Skin & Axial Inspection: No masses, redness, edema, swelling, or associated skin lesions Alignment: Symmetrical Functional ROM: Unrestricted ROM      Stability: No instability detected Muscle Tone/Strength: Functionally intact. No obvious neuro-muscular anomalies detected. Sensory (Neurological): Unimpaired Palpation: No palpable anomalies              Upper Extremity (UE) Exam    Side: Right upper extremity  Side: Left upper extremity  Skin & Extremity Inspection: Skin color, temperature, and hair growth are WNL. No peripheral edema or cyanosis. No masses, redness, swelling, asymmetry, or associated skin lesions. No contractures.  Skin & Extremity Inspection: Skin color, temperature, and hair growth are WNL. No peripheral edema or cyanosis. No masses, redness, swelling, asymmetry, or associated skin lesions. No contractures.  Functional ROM: Unrestricted ROM          Functional ROM: Unrestricted ROM          Muscle Tone/Strength: Functionally intact. No obvious neuro-muscular anomalies  detected.  Muscle Tone/Strength: Functionally intact. No obvious neuro-muscular anomalies detected.  Sensory (Neurological): Unimpaired          Sensory (Neurological): Unimpaired          Palpation: No palpable anomalies              Palpation: No palpable anomalies              Specialized Test(s): Deferred         Specialized Test(s): Deferred          Thoracic Spine Area Exam  Skin & Axial Inspection: No masses, redness, or swelling Alignment: Symmetrical Functional ROM: Unrestricted ROM Stability: No instability detected Muscle Tone/Strength: Functionally intact. No obvious neuro-muscular anomalies detected. Sensory (Neurological): Unimpaired Muscle strength & Tone: No palpable anomalies Lumbar Spine Area Exam  Skin & Axial Inspection: Lumbar Scoliosis Alignment: Symmetrical Functional ROM: Decreased ROM, bilaterally Stability: No instability detected Muscle Tone/Strength: Functionally intact. No obvious neuro-muscular anomalies detected. Sensory (Neurological): Dermatomal pain pattern Palpation: Complains of area being tender to palpation       Provocative Tests: Lumbar Hyperextension and rotation test: Positive bilaterally for facet joint pain. Lumbar Lateral bending test: evaluation deferred today       Patrick's Maneuver: evaluation deferred today  Gait & Posture Assessment  Ambulation: Limited Gait: Antalgic Posture: WNL   Lower Extremity Exam    Side: Right lower extremity  Side: Left lower extremity  Skin & Extremity Inspection: Skin color, temperature, and hair growth are WNL. No peripheral edema or cyanosis. No masses, redness, swelling, asymmetry, or associated skin lesions. No contractures.  Skin & Extremity Inspection: Skin color, temperature, and hair growth are WNL. No peripheral edema or cyanosis. No masses, redness, swelling, asymmetry, or associated skin lesions. No contractures.  Functional ROM: Unrestricted ROM          Functional  ROM: Unrestricted ROM          Muscle Tone/Strength: Functionally intact. No obvious neuro-muscular anomalies detected.  Muscle Tone/Strength: Functionally intact. No obvious neuro-muscular anomalies detected.  Sensory (Neurological): Unimpaired  Sensory (Neurological): Unimpaired  Palpation: No palpable anomalies  Palpation: No palpable anomalies     Assessment  Primary Diagnosis & Pertinent Problem List: The primary encounter diagnosis was Lumbar radiculopathy. Diagnoses of Chronic pain syndrome, DDD (degenerative disc disease), lumbar, Spinal stenosis, lumbar region, with neurogenic claudication, and Primary osteoarthritis of both knees were also pertinent to this visit.  Status Diagnosis  Responding Persistent Controlled 1. Lumbar radiculopathy   2. Chronic pain syndrome   3. DDD (degenerative disc disease), lumbar   4. Spinal stenosis, lumbar region, with neurogenic claudication   5. Primary osteoarthritis of both knees      General Recommendations: The pain condition that the patient suffers from is best treated with a multidisciplinary approach that involves an increase in physical activity to prevent de-conditioning and worsening of the pain cycle, as well as psychological counseling (formal and/or informal) to address the co-morbid psychological affects of pain. Treatment will often involve judicious use of pain medications and interventional procedures to decrease the pain, allowing the patient to participate in the physical activity that will ultimately produce long-lasting pain reductions. The goal of the multidisciplinary approach is to return the patient to a higher level of overall function and to restore their ability to perform activities of daily living.  62 year old female who presents with a chief complaint of axial low back pain that radiates into her right hip and then to her anterior thigh to her groin and then down to her knees.  Patient does have a history of a  cervical discectomy and fusion but she is not endorsing any significant neck pain.  Of note, she was previously a pain patient at this clinic in the past with Dr. Primus Bravo. She describes numbness and tingling along her right hip and anterior thigh.  Her lumbar MRI is significant for L4-L5 disc extrusion on the left abutting the left anterior thecal sac and left-sided nerve roots along with moderate degenerative facet hypertrophy and moderate lateral recess foraminal stenosis.  She also has moderate degenerative facet changes right greater than left at L5-S1.  Patient is on gabapentin 800 mill grams 3 times daily, Soma 3 times a day, Cymbalta 60 mg daily.  She is not on any opioid therapy.  Last opioid prescription was for hydrocodone 10 mg, quantity 120, last filled 09/08/2016.  Patient will not be a candidate for opioid therapy given her intake of Soma and its associated increased risk of respiratory depression and cardiorespiratory events when taken concomitantly with opioid medications.  We will focus on non-opioid analgesics given the patient's chronic use of Soma which she states will be difficult for her to discontinue.   Patient returns today s/p right L4-L5 epidural steroid  injection which provided her with benefit of approximately 50% that is ongoing.  Patient states that the sharp shooting pain in her right lower extremity has improved and is not as intense/severe as before. Discussed repeating L-ESI #2 at L4/L5 and patient would like to proceed.  She has been told that she needs a right hip replacement for right hip OA but she needs to stop smoking and loose weight. She states that she has lost 15 lbs in approx 5 weeks. We discussed weight loss techniques and diet modification.  Plan: -Repeat lumbar epidural steroid injection at right L4-L5 #2. -Continue amitriptyline 50 mill grams nightly -Discussed weight loss techniques along with diet modification.  Lab-work, procedure(s), and/or  referral(s): Orders Placed This Encounter  Procedures  . Lumbar Epidural Injection    Provider-requested follow-up: Return in about 2 weeks (around 03/03/2018) for Procedure. Time Note: Greater than 50% of the 25 minute(s) of face-to-face time spent with [name], was spent in counseling/coordination of care regarding: the treatment plan, treatment alternatives, going over the informed consent, the results, interpretation and significance of  her recent diagnostic interventional treatment(s), realistic expectations and the need to bring and keep the BMI below 30. Future Appointments  Date Time Provider [place]  03/02/2018 10:00 AM Gillis Santa, MD [facility] None  03/28/2018  1:30 PM [name], NP [facility] [place]    Primary Care Physician: [name], NP Location: [facility] Note by: Gillis Santa, M.D Date: 02/17/2018; Time: 1:50 PM  Patient Instructions  ____________________________________________________________________________________________  Preparing for your procedure (without sedation)  Instructions: . Oral Intake: Do not eat or drink anything for at least 3 hours prior to your procedure. . Transportation: Unless otherwise stated by your physician, you may drive yourself after the procedure. . Blood Pressure Medicine: Take your blood pressure medicine with a sip of water the morning of the procedure. . Blood thinners:  . Diabetics on insulin: Notify the staff so that you can be scheduled 1st case in the morning. If your diabetes requires high dose insulin, take only  of your normal insulin dose the morning of the procedure and notify the staff that you have done so. . Preventing infections: Shower with an antibacterial soap the morning of your procedure.  . Build-up your immune system: Take 1000 mg of Vitamin C with every meal (3 times a day) the day prior to your procedure. [place] Antibiotics: Inform the staff if you have a condition or  reason that requires you to take antibiotics before dental procedures. . Pregnancy: If you are pregnant, call and cancel the procedure. . Sickness: If you have a cold, fever, or any active infections, call and cancel the procedure. . Arrival: You must be in the facility at least 30 minutes prior to your scheduled procedure. . Children: Do not bring any children with you. . Dress appropriately: Bring dark clothing that you would not mind if they get stained. . Valuables: Do not bring any jewelry or valuables.  Procedure appointments are reserved for interventional treatments only. [place] No Prescription Refills. . No medication changes will be discussed during procedure appointments. . No disability issues will be discussed.  Remember:  Regular Business hours are:  Monday to Thursday 8:00 AM to 4:00 PM  Provider's Schedule: Milinda Pointer, MD:  Procedure days: Tuesday and Thursday 7:30 AM to 4:00 PM  Gillis Santa, MD:  Procedure days: Monday and Wednesday 7:30 AM to 4:00 PM ____________________________________________________________________________________________

## 2018-02-17 NOTE — Patient Instructions (Signed)
____________________________________________________________________________________________  Preparing for your procedure (without sedation)  Instructions: . Oral Intake: Do not eat or drink anything for at least 3 hours prior to your procedure. . Transportation: Unless otherwise stated by your physician, you may drive yourself after the procedure. . Blood Pressure Medicine: Take your blood pressure medicine with a sip of water the morning of the procedure. . Blood thinners:  . Diabetics on insulin: Notify the staff so that you can be scheduled 1st case in the morning. If your diabetes requires high dose insulin, take only  of your normal insulin dose the morning of the procedure and notify the staff that you have done so. . Preventing infections: Shower with an antibacterial soap the morning of your procedure.  . Build-up your immune system: Take 1000 mg of Vitamin C with every meal (3 times a day) the day prior to your procedure. . Antibiotics: Inform the staff if you have a condition or reason that requires you to take antibiotics before dental procedures. . Pregnancy: If you are pregnant, call and cancel the procedure. . Sickness: If you have a cold, fever, or any active infections, call and cancel the procedure. . Arrival: You must be in the facility at least 30 minutes prior to your scheduled procedure. . Children: Do not bring any children with you. . Dress appropriately: Bring dark clothing that you would not mind if they get stained. . Valuables: Do not bring any jewelry or valuables.  Procedure appointments are reserved for interventional treatments only. . No Prescription Refills. . No medication changes will be discussed during procedure appointments. . No disability issues will be discussed.  Remember:  Regular Business hours are:  Monday to Thursday 8:00 AM to 4:00 PM  Provider's Schedule: Francisco Naveira, MD:  Procedure days: Tuesday and Thursday 7:30 AM to 4:00  PM  Bilal Lateef, MD:  Procedure days: Monday and Wednesday 7:30 AM to 4:00 PM ____________________________________________________________________________________________    

## 2018-02-18 ENCOUNTER — Other Ambulatory Visit: Payer: Self-pay | Admitting: Unknown Physician Specialty

## 2018-02-18 ENCOUNTER — Encounter: Payer: Self-pay | Admitting: Unknown Physician Specialty

## 2018-03-02 ENCOUNTER — Ambulatory Visit: Payer: BLUE CROSS/BLUE SHIELD | Admitting: Student in an Organized Health Care Education/Training Program

## 2018-03-04 ENCOUNTER — Encounter: Payer: Self-pay | Admitting: Unknown Physician Specialty

## 2018-03-04 NOTE — Telephone Encounter (Signed)
OK to write jury duty note

## 2018-03-15 ENCOUNTER — Other Ambulatory Visit: Payer: Self-pay | Admitting: Student in an Organized Health Care Education/Training Program

## 2018-03-20 ENCOUNTER — Other Ambulatory Visit: Payer: Self-pay | Admitting: Unknown Physician Specialty

## 2018-03-21 NOTE — Telephone Encounter (Signed)
buspirone refill Last OV: 09/07/17 Last Refill:12/20/17 #180 tab  Pharmacy:[facility] [name] NP

## 2018-03-23 ENCOUNTER — Other Ambulatory Visit: Payer: Self-pay

## 2018-03-23 NOTE — Telephone Encounter (Signed)
Patient last seen 12/27/17 and has f/up 03/28/17.

## 2018-03-24 MED ORDER — GABAPENTIN 800 MG PO TABS: 800.0000 mg | ORAL_TABLET | Freq: Three times a day (TID) | ORAL | 0 refills | Status: DC

## 2018-03-28 ENCOUNTER — Ambulatory Visit (INDEPENDENT_AMBULATORY_CARE_PROVIDER_SITE_OTHER): Payer: BLUE CROSS/BLUE SHIELD | Admitting: Unknown Physician Specialty

## 2018-03-28 ENCOUNTER — Encounter: Payer: Self-pay | Admitting: Unknown Physician Specialty

## 2018-03-28 DIAGNOSIS — G8929 Other chronic pain: Secondary | ICD-10-CM

## 2018-03-28 DIAGNOSIS — F419 Anxiety disorder, unspecified: Secondary | ICD-10-CM | POA: Diagnosis not present

## 2018-03-28 DIAGNOSIS — I10 Essential (primary) hypertension: Secondary | ICD-10-CM | POA: Diagnosis not present

## 2018-03-28 DIAGNOSIS — E782 Mixed hyperlipidemia: Secondary | ICD-10-CM

## 2018-03-28 DIAGNOSIS — Z720 Tobacco use: Secondary | ICD-10-CM

## 2018-03-28 MED ORDER — HYDROXYZINE HCL 10 MG PO TABS: 10.0000 mg | ORAL_TABLET | Freq: Three times a day (TID) | ORAL | 1 refills | Status: DC | PRN

## 2018-03-28 MED ORDER — BACLOFEN 20 MG PO TABS: 20.0000 mg | ORAL_TABLET | Freq: Three times a day (TID) | ORAL | 0 refills | Status: DC

## 2018-03-28 MED ORDER — ATORVASTATIN CALCIUM 40 MG PO TABS: 40.0000 mg | ORAL_TABLET | Freq: Every day | ORAL | 3 refills | Status: DC

## 2018-03-28 MED ORDER — OMEPRAZOLE 40 MG PO CPDR: 40.0000 mg | DELAYED_RELEASE_CAPSULE | Freq: Every day | ORAL | 3 refills | Status: DC

## 2018-03-28 NOTE — Assessment & Plan Note (Signed)
Stable, continue present medications.   

## 2018-03-28 NOTE — Progress Notes (Signed)
BP (!) 147/76   Pulse 99   Temp 99.6 F (37.6 C) (Oral)   Ht 5\' 3"  (1.6 m)   Wt 267 lb 6.4 oz (121.3 kg)   LMP  (LMP Unknown)   SpO2 96%   BMI 47.37 kg/m    Subjective:    Patient ID: [name], female    DOB: January 28, 1956, 62 y.o.   MRN: [id number]  HPI: [name] is a 62 y.o. female  Chief Complaint  Patient presents with  . Depression  . Hyperlipidemia  . Hypertension  . Gastroesophageal Reflux   Hypertension Using medications without difficulty Average home BPs: Not checking   No problems or lightheadedness No chest pain with exertion or shortness of breath No Edema  Hyperlipidemia Using medications without problems: No Muscle aches  Diet compliance:Exercise: unable to exercise.  Working on diet to lose weight  Anxiety Besides her pain, she is doing well.   Depression screen [facility] 2/9 02/17/2018 01/19/2018 01/05/2018 12/27/2017 09/07/2017  Decreased Interest 0 0 0 2 0  Down, Depressed, Hopeless 0 0 0 1 0  PHQ - 2 Score 0 0 0 3 0  Altered sleeping - 0 1 3 3   Tired, decreased energy - 0 0 2 1  Change in appetite - 0 0 0 3  Feeling bad or failure about yourself  - 0 0 1 0  Trouble concentrating - 0 0 1 0  Moving slowly or fidgety/restless - 0 0 0 0  Suicidal thoughts - 0 0 0 0  PHQ-9 Score - 0 1 10 7   Some recent data might be hidden     Back pain Going to the pain clinic.  Getting shots.  Would like to consider changing Soma to something different as incompatable with Opioid management.  Needs to lose weight to get hip surgery  Tobacco Tried to quit but gained weight  Relevant past medical, surgical, family and social history reviewed and updated as indicated. Interim medical history since our last visit reviewed. Allergies and medications reviewed and updated.  Review of Systems  Per HPI unless specifically indicated above     Objective:    BP (!) 147/76   Pulse 99   Temp 99.6 F (37.6 C) (Oral)   Ht 5\' 3"  (1.6 m)   Wt 267 lb 6.4 oz  (121.3 kg)   LMP  (LMP Unknown)   SpO2 96%   BMI 47.37 kg/m   Wt Readings from Last 3 Encounters:  03/28/18 267 lb 6.4 oz (121.3 kg)  02/17/18 263 lb (119.3 kg)  01/19/18 271 lb (122.9 kg)    Physical Exam  Constitutional: She is oriented to person, place, and time. She appears well-developed and well-nourished. No distress.  HENT:  Head: Normocephalic and atraumatic.  Eyes: Conjunctivae and lids are normal. Right eye exhibits no discharge. Left eye exhibits no discharge. No scleral icterus.  Neck: Normal range of motion. Neck supple. No JVD present. Carotid bruit is not present.  Cardiovascular: Normal rate, regular rhythm and normal heart sounds.  Pulmonary/Chest: Effort normal and breath sounds normal.  Abdominal: Normal appearance. There is no splenomegaly or hepatomegaly.  Musculoskeletal: Normal range of motion.  Neurological: She is alert and oriented to person, place, and time.  Skin: Skin is warm, dry and intact. No rash noted. No pallor.  Psychiatric: She has a normal mood and affect. Her behavior is normal. Judgment and thought content normal.    Results for orders placed or performed in visit  on 01/05/18  Compliance Drug Analysis, Ur  Result Value Ref Range   Summary FINAL       Assessment & Plan:   Problem List Items Addressed This Visit      Unprioritized   Anxiety    Stable, continue present medications.        Relevant Medications   hydrOXYzine (ATARAX/VISTARIL) 10 MG tablet   Benign hypertension    High today but in lots of pain today.  Will continue to monitor      Relevant Medications   atorvastatin (LIPITOR) 40 MG tablet   Chronic pain    DC Soma.  Try Baclofen instead.  Continue with the pain clinic      Relevant Medications   baclofen (LIORESAL) 20 MG tablet   Hyperlipidemia    Takes Lovastatin 40 mg QHS but last LDL was 127.  Will change Atorvastatin 40 mg.        Relevant Medications   atorvastatin (LIPITOR) 40 MG tablet   Tobacco  abuse     I have recommended absolute tobacco cessation. I have discussed various options available for assistance with tobacco cessation including over the counter methods (Nicotine gum, patch and lozenges). We also discussed prescription options (Chantix, Nicotine Inhaler / Nasal Spray). The patient is not interested in pursuing any prescription tobacco cessation options at this time.           Follow up plan: Return in about 3 months (around 06/27/2018).

## 2018-03-28 NOTE — Assessment & Plan Note (Signed)
DC Soma.  Try Baclofen instead.  Continue with the pain clinic

## 2018-03-28 NOTE — Assessment & Plan Note (Signed)
High today but in lots of pain today.  Will continue to monitor

## 2018-03-28 NOTE — Assessment & Plan Note (Signed)
I have recommended absolute tobacco cessation. I have discussed various options available for assistance with tobacco cessation including over the counter methods (Nicotine gum, patch and lozenges). We also discussed prescription options (Chantix, Nicotine Inhaler / Nasal Spray). The patient is not interested in pursuing any prescription tobacco cessation options at this time.  

## 2018-03-28 NOTE — Assessment & Plan Note (Signed)
Takes Lovastatin 40 mg QHS but last LDL was 127.  Will change Atorvastatin 40 mg.

## 2018-03-30 ENCOUNTER — Telehealth: Payer: Self-pay | Admitting: *Deleted

## 2018-03-30 ENCOUNTER — Encounter: Payer: Self-pay | Admitting: Unknown Physician Specialty

## 2018-03-30 ENCOUNTER — Ambulatory Visit: Payer: BLUE CROSS/BLUE SHIELD | Admitting: Student in an Organized Health Care Education/Training Program

## 2018-03-30 MED ORDER — ONDANSETRON HCL 4 MG PO TABS: 4.0000 mg | ORAL_TABLET | Freq: Three times a day (TID) | ORAL | 0 refills | Status: DC | PRN

## 2018-03-30 NOTE — Telephone Encounter (Signed)
Will cancel procedure today and call to reschedule.

## 2018-04-05 ENCOUNTER — Encounter: Payer: Self-pay | Admitting: Unknown Physician Specialty

## 2018-04-10 ENCOUNTER — Other Ambulatory Visit: Payer: Self-pay | Admitting: Unknown Physician Specialty

## 2018-04-11 NOTE — Telephone Encounter (Signed)
Refill request Soma  LOV 03/28/2018 [name]   Last Filled 12/31/2017  90 tabs   Pharmacy on File

## 2018-04-18 ENCOUNTER — Ambulatory Visit
Admission: RE | Admit: 2018-04-18 | Discharge: 2018-04-18 | Disposition: A | Discharge status: Home / Self Care | Payer: BLUE CROSS/BLUE SHIELD | Source: Ambulatory Visit | Attending: Student in an Organized Health Care Education/Training Program | Admitting: Student in an Organized Health Care Education/Training Program

## 2018-04-18 ENCOUNTER — Encounter: Payer: Self-pay | Admitting: Student in an Organized Health Care Education/Training Program

## 2018-04-18 ENCOUNTER — Ambulatory Visit (HOSPITAL_BASED_OUTPATIENT_CLINIC_OR_DEPARTMENT_OTHER): Payer: BLUE CROSS/BLUE SHIELD | Admitting: Student in an Organized Health Care Education/Training Program

## 2018-04-18 VITALS — BP 115/70 | HR 94 | Temp 98.7°F | Resp 20 | Ht 63.0 in | Wt 262.0 lb

## 2018-04-18 DIAGNOSIS — Z7982 Long term (current) use of aspirin: Secondary | ICD-10-CM | POA: Insufficient documentation

## 2018-04-18 DIAGNOSIS — Z79899 Other long term (current) drug therapy: Secondary | ICD-10-CM | POA: Diagnosis not present

## 2018-04-18 DIAGNOSIS — Z981 Arthrodesis status: Secondary | ICD-10-CM | POA: Diagnosis not present

## 2018-04-18 DIAGNOSIS — M5416 Radiculopathy, lumbar region: Secondary | ICD-10-CM | POA: Insufficient documentation

## 2018-04-18 MED ORDER — ROPIVACAINE HCL 2 MG/ML IJ SOLN
2.0000 mL | Freq: Once | INTRAMUSCULAR | Status: AC
  Administered 2018-04-18: 10 mL via EPIDURAL
  Filled 2018-04-18: qty 10

## 2018-04-18 MED ORDER — AMITRIPTYLINE HCL 25 MG PO TABS: 50.0000 mg | ORAL_TABLET | Freq: Every day | ORAL | 3 refills | Status: DC

## 2018-04-18 MED ORDER — LIDOCAINE HCL (PF) 1 % IJ SOLN
4.5000 mL | Freq: Once | INTRAMUSCULAR | Status: AC
  Administered 2018-04-18: 5 mL
  Filled 2018-04-18: qty 5

## 2018-04-18 MED ORDER — IOPAMIDOL (ISOVUE-M 200) INJECTION 41%
10.0000 mL | Freq: Once | INTRAMUSCULAR | Status: AC
  Administered 2018-04-18: 10 mL via EPIDURAL
  Filled 2018-04-18: qty 10

## 2018-04-18 MED ORDER — SODIUM CHLORIDE 0.9% FLUSH
2.0000 mL | Freq: Once | INTRAVENOUS | Status: AC
  Administered 2018-04-18: 10 mL

## 2018-04-18 MED ORDER — DEXAMETHASONE SODIUM PHOSPHATE 10 MG/ML IJ SOLN
10.0000 mg | Freq: Once | INTRAMUSCULAR | Status: AC
  Administered 2018-04-18: 10 mg
  Filled 2018-04-18: qty 1

## 2018-04-18 NOTE — Patient Instructions (Signed)
Pain Management Discharge Instructions  General Discharge Instructions :  If you need to reach your doctor call: Monday-Friday 8:00 am - 4:00 pm at [phone] or toll free [phone].  After clinic hours [phone] to have operator reach doctor.  Bring all of your medication bottles to all your appointments in the pain clinic.  To cancel or reschedule your appointment with Pain Management please remember to call 24 hours in advance to avoid a fee.  Refer to the educational materials which you have been given on: General Risks, I had my Procedure. Discharge Instructions, Post Sedation.  Post Procedure Instructions:  Please notify your doctor immediately if you have any unusual bleeding, trouble breathing or pain that is not related to your normal pain.  Depending on the type of procedure that was done, some parts of your body may feel week and/or numb.  This usually clears up by tonight or the next day.  Walk with the use of an assistive device or accompanied by an adult for the 24 hours.  You may use ice on the affected area for the first 24 hours.  Put ice in a Ziploc bag and cover with a towel and place against area 15 minutes on 15 minutes off.  You may switch to heat after 24 hours. 

## 2018-04-18 NOTE — Progress Notes (Signed)
Safety precautions to be maintained throughout the outpatient stay will include: orient to surroundings, keep bed in low position, maintain call bell within reach at all times, provide assistance with transfer out of bed and ambulation.  

## 2018-04-18 NOTE — Progress Notes (Signed)
Patient's Name: [name]  MRN: [id number]  Referring Provider: [name], NP  DOB: Mar 28, 1956  PCP: [name], NP  DOS: 04/18/2018  Note by: Gillis Santa, MD  Service setting: Ambulatory outpatient  Specialty: Interventional Pain Management  Patient type: Established  Location: [facility] (AMB) Pain Management Facility  Visit type: Interventional Procedure   Primary Reason for Visit: Interventional Pain Management Treatment. CC: Back Pain (lower bilateral)  Procedure:       Anesthesia, Analgesia, Anxiolysis:  Type: Therapeutic Inter-Laminar Epidural Steroid Injection #2  Region: Lumbar Level: L4-5 Level. Laterality: Midline         Type: Local Anesthesia Local Anesthetic: Lidocaine 1% Route: Infiltration ([place]/IM) IV Access: Declined Sedation: Declined  Indication(s): Analgesia and Anxiety   Indications: 1. Lumbar radiculopathy    Pain Score: Pre-procedure: 9 /10 Post-procedure: 5 /10  Pre-op Assessment:  [name] is a 62 y.o. (year old), female patient, seen today for interventional treatment. She  has a past surgical history that includes Appendectomy; Tonsillectomy; Abdominal hysterectomy; bone fusion in neck; Achilles tendon repair; and Hernia repair. [name] has a current medication list which includes the following prescription(s): aspirin, atorvastatin, bupropion, buspirone, calcium citrate-vitamin d, carisoprodol, duloxetine, furosemide, gabapentin, hydroxyzine, ibuprofen, multivitamin with minerals, omeprazole, ondansetron, potassium chloride sa, proair hfa, telmisartan-hydrochlorothiazide, tiotropium, amitriptyline, and baclofen, and the following Facility-Administered Medications: bupivacaine (pf), bupivacaine (pf), bupivacaine (pf), bupivacaine (pf), cefazolin, cefazolin, cefazolin, fentanyl, fentanyl, lactated ringers, lactated ringers, lactated ringers, lactated ringers, lidocaine (pf), lidocaine (pf), midazolam, midazolam, orphenadrine, orphenadrine,  orphenadrine, sodium chloride flush, sodium chloride flush, triamcinolone acetonide, triamcinolone acetonide, and triamcinolone acetonide. Her primarily concern today is the Back Pain (lower bilateral)  Initial Vital Signs:  Pulse Rate: 94 Temp: 98.7 F (37.1 C) Resp: 16 BP: (!) 124/56 SpO2: 97 %  BMI: Estimated body mass index is 46.41 kg/m as calculated from the following:   Height as of this encounter: 5\' 3"  (1.6 m).   Weight as of this encounter: 262 lb (118.8 kg).  Risk Assessment: Allergies: Reviewed. She has No Known Allergies.  Allergy Precautions: None required Coagulopathies: Reviewed. None identified.  Blood-thinner therapy: None at this time Active Infection(s): Reviewed. None identified. [name] is afebrile  Site Confirmation: [name] was asked to confirm the procedure and laterality before marking the site Procedure checklist: Completed Consent: Before the procedure and under the influence of no sedative(s), amnesic(s), or anxiolytics, the patient was informed of the treatment options, risks and possible complications. To fulfill our ethical and legal obligations, as recommended by the American Medical Association's Code of Ethics, I have informed the patient of my clinical impression; the nature and purpose of the treatment or procedure; the risks, benefits, and possible complications of the intervention; the alternatives, including doing nothing; the risk(s) and benefit(s) of the alternative treatment(s) or procedure(s); and the risk(s) and benefit(s) of doing nothing. The patient was provided information about the general risks and possible complications associated with the procedure. These may include, but are not limited to: failure to achieve desired goals, infection, bleeding, organ or nerve damage, allergic reactions, paralysis, and death. In addition, the patient was informed of those risks and complications associated to Spine-related procedures, such as  failure to decrease pain; infection (i.e.: Meningitis, epidural or intraspinal abscess); bleeding (i.e.: epidural hematoma, subarachnoid hemorrhage, or any other type of intraspinal or peri-dural bleeding); organ or nerve damage (i.e.: Any type of peripheral nerve, nerve root, or spinal cord injury) with subsequent damage to sensory, motor, and/or autonomic systems, resulting  in permanent pain, numbness, and/or weakness of one or several areas of the body; allergic reactions; (i.e.: anaphylactic reaction); and/or death. Furthermore, the patient was informed of those risks and complications associated with the medications. These include, but are not limited to: allergic reactions (i.e.: anaphylactic or anaphylactoid reaction(s)); adrenal axis suppression; blood sugar elevation that in diabetics may result in ketoacidosis or comma; water retention that in patients with history of congestive heart failure may result in shortness of breath, pulmonary edema, and decompensation with resultant heart failure; weight gain; swelling or edema; medication-induced neural toxicity; particulate matter embolism and blood vessel occlusion with resultant organ, and/or nervous system infarction; and/or aseptic necrosis of one or more joints. Finally, the patient was informed that Medicine is not an exact science; therefore, there is also the possibility of unforeseen or unpredictable risks and/or possible complications that may result in a catastrophic outcome. The patient indicated having understood very clearly. We have given the patient no guarantees and we have made no promises. Enough time was given to the patient to ask questions, all of which were answered to the patient's satisfaction. [name] has indicated that she wanted to continue with the procedure. Attestation: I, the ordering provider, attest that I have discussed with the patient the benefits, risks, side-effects, alternatives, likelihood of achieving goals, and  potential problems during recovery for the procedure that I have provided informed consent. Date  Time: 04/18/2018 10:04 AM  Pre-Procedure Preparation:  Monitoring: As per clinic protocol. Respiration, ETCO2, SpO2, BP, heart rate and rhythm monitor placed and checked for adequate function Safety Precautions: Patient was assessed for positional comfort and pressure points before starting the procedure. Time-out: I initiated and conducted the "Time-out" before starting the procedure, as per protocol. The patient was asked to participate by confirming the accuracy of the "Time Out" information. Verification of the correct person, site, and procedure were performed and confirmed by me, the nursing staff, and the patient. "Time-out" conducted as per Joint Commission's Universal Protocol (UP.01.01.01). Time: 1037  Description of Procedure:       Position: Prone with head of the table was raised to facilitate breathing. Target Area: The interlaminar space, initially targeting the lower laminar border of the superior vertebral body. Approach: Paramedial approach. Area Prepped: Entire Posterior Lumbar Region Prepping solution: ChloraPrep (2% chlorhexidine gluconate and 70% isopropyl alcohol) Safety Precautions: Aspiration looking for blood return was conducted prior to all injections. At no point did we inject any substances, as a needle was being advanced. No attempts were made at seeking any paresthesias. Safe injection practices and needle disposal techniques used. Medications properly checked for expiration dates. SDV (single dose vial) medications used. Description of the Procedure: Protocol guidelines were followed. The procedure needle was introduced through the skin, ipsilateral to the reported pain, and advanced to the target area. Bone was contacted and the needle walked caudad, until the lamina was cleared. The epidural space was identified using "loss-of-resistance technique" with 2-3 ml of  PF-NaCl (0.9% NSS), in a 5cc LOR glass syringe. Vitals:   04/18/18 1008 04/18/18 1038 04/18/18 1043 04/18/18 1047  BP: (!) 124/56 138/70 125/61 115/70  Pulse: 94     Resp: 16 (!) 22 (!) 22 20  Temp: 98.7 F (37.1 C)     TempSrc: Oral     SpO2: 97% 95% 92% 95%  Weight: 262 lb (118.8 kg)     Height: 5\' 3"  (1.6 m)       Start Time: 1037 hrs. End Time: 4235  hrs. Materials:  Needle(s) Type: Epidural needle Gauge: 17G Length: 5-in Medication(s): Please see orders for medications and dosing details. 9 cc solution made of 6 cc of preservative-free saline, 2 cc of 0.2% ropivacaine, 1 cc of Decadron 10 mg/cc. Imaging Guidance (Spinal):  Type of Imaging Technique: Fluoroscopy Guidance (Spinal) Indication(s): Assistance in needle guidance and placement for procedures requiring needle placement in or near specific anatomical locations not easily accessible without such assistance. Exposure Time: Please see nurses notes. Contrast: Before injecting any contrast, we confirmed that the patient did not have an allergy to iodine, shellfish, or radiological contrast. Once satisfactory needle placement was completed at the desired level, radiological contrast was injected. Contrast injected under live fluoroscopy. No contrast complications. See chart for type and volume of contrast used. Fluoroscopic Guidance: I was personally present during the use of fluoroscopy. "Tunnel Vision Technique" used to obtain the best possible view of the target area. Parallax error corrected before commencing the procedure. "Direction-depth-direction" technique used to introduce the needle under continuous pulsed fluoroscopy. Once target was reached, antero-posterior, oblique, and lateral fluoroscopic projection used confirm needle placement in all planes. Images permanently stored in EMR. Interpretation: I personally interpreted the imaging intraoperatively. Adequate needle placement confirmed in multiple planes. Appropriate  spread of contrast into desired area was observed. No evidence of afferent or efferent intravascular uptake. No intrathecal or subarachnoid spread observed. Permanent images saved into the patient's record.  Antibiotic Prophylaxis:   Anti-infectives (From admission, onward)   None     Indication(s): None identified  Post-operative Assessment:  Post-procedure Vital Signs:  Pulse Rate: 94 Temp: 98.7 F (37.1 C) Resp: 20 BP: 115/70 SpO2: 95 %  EBL: None  Complications: No immediate post-treatment complications observed by team, or reported by patient.  Note: The patient tolerated the entire procedure well. A repeat set of vitals were taken after the procedure and the patient was kept under observation following institutional policy, for this type of procedure. Post-procedural neurological assessment was performed, showing return to baseline, prior to discharge. The patient was provided with post-procedure discharge instructions, including a section on how to identify potential problems. Should any problems arise concerning this procedure, the patient was given instructions to immediately contact us, at any time, without hesitation. In any case, we plan to contact the patient by telephone for a follow-up status report regarding this interventional procedure.  Comments:  No additional relevant information. 5 out of 5 strength bilateral lower extremity: Plantar flexion, dorsiflexion, knee flexion, knee extension.  Plan of Care    Imaging Orders     DG C-Arm 1-60 Min-No Report Procedure Orders    No procedure(s) ordered today    Medications ordered for procedure: Meds ordered this encounter  Medications  . iopamidol (ISOVUE-M) 41 % intrathecal injection 10 mL  . ropivacaine (PF) 2 mg/mL (0.2%) (NAROPIN) injection 2 mL  . sodium chloride flush (NS) 0.9 % injection 2 mL  . dexamethasone (DECADRON) injection 10 mg  . lidocaine (PF) (XYLOCAINE) 1 % injection 4.5 mL  . DISCONTD:  amitriptyline (ELAVIL) 25 MG tablet    Sig: Take 2 tablets (50 mg total) by mouth at bedtime. 25 mg qhs x 3 weeks then 50 mg qhs    Dispense:  60 tablet    Refill:  3  . amitriptyline (ELAVIL) 25 MG tablet    Sig: Take 2 tablets (50 mg total) by mouth at bedtime.    Dispense:  60 tablet    Refill:  3   Medications  administered: We administered iopamidol, ropivacaine (PF) 2 mg/mL (0.2%), sodium chloride flush, dexamethasone, and lidocaine (PF).  See the medical record for exact dosing, route, and time of administration.  New Prescriptions   No medications on file   Disposition: Discharge home  Discharge Date & Time: 04/18/2018; 1055 hrs.   Physician-requested Follow-up: Return in about 3 weeks (around 05/09/2018) for Post Procedure Evaluation.  Future Appointments  Date Time Provider [place]  05/11/2018 11:30 AM Gillis Santa, MD [facility] None  06/28/2018  1:30 PM [name], NP [facility] [place]   Primary Care Physician: [name], NP Location: [facility] Facility Note by: Gillis Santa, MD Date: 04/18/2018; Time: 1:48 PM  Disclaimer:  Medicine is not an exact science. The only guarantee in medicine is that nothing is guaranteed. It is important to note that the decision to proceed with this intervention was based on the information collected from the patient. The Data and conclusions were drawn from the patient's questionnaire, the interview, and the physical examination. Because the information was provided in large part by the patient, it cannot be guaranteed that it has not been purposely or unconsciously manipulated. Every effort has been made to obtain as much relevant data as possible for this evaluation. It is important to note that the conclusions that lead to this procedure are derived in large part from the available data. Always take into account that the treatment will also be dependent on availability of resources and existing treatment  guidelines, considered by other Pain Management Practitioners as being common knowledge and practice, at the time of the intervention. For Medico-Legal purposes, it is also important to point out that variation in procedural techniques and pharmacological choices are the acceptable norm. The indications, contraindications, technique, and results of the above procedure should only be interpreted and judged by a Board-Certified Interventional Pain Specialist with extensive familiarity and expertise in the same exact procedure and technique.

## 2018-04-19 ENCOUNTER — Telehealth: Payer: Self-pay

## 2018-04-19 NOTE — Telephone Encounter (Signed)
Post procedure phone call.  Left message.  

## 2018-05-11 ENCOUNTER — Ambulatory Visit
Payer: BLUE CROSS/BLUE SHIELD | Attending: Student in an Organized Health Care Education/Training Program | Admitting: Student in an Organized Health Care Education/Training Program

## 2018-05-11 ENCOUNTER — Encounter: Payer: Self-pay | Admitting: Student in an Organized Health Care Education/Training Program

## 2018-05-11 ENCOUNTER — Other Ambulatory Visit: Payer: Self-pay

## 2018-05-11 VITALS — BP 119/54 | HR 84 | Temp 98.6°F | Resp 20 | Ht 63.0 in | Wt 262.0 lb

## 2018-05-11 DIAGNOSIS — G894 Chronic pain syndrome: Secondary | ICD-10-CM | POA: Diagnosis not present

## 2018-05-11 DIAGNOSIS — Z7982 Long term (current) use of aspirin: Secondary | ICD-10-CM | POA: Insufficient documentation

## 2018-05-11 DIAGNOSIS — M5116 Intervertebral disc disorders with radiculopathy, lumbar region: Secondary | ICD-10-CM | POA: Diagnosis not present

## 2018-05-11 DIAGNOSIS — I10 Essential (primary) hypertension: Secondary | ICD-10-CM | POA: Diagnosis not present

## 2018-05-11 DIAGNOSIS — G250 Essential tremor: Secondary | ICD-10-CM | POA: Diagnosis not present

## 2018-05-11 DIAGNOSIS — F419 Anxiety disorder, unspecified: Secondary | ICD-10-CM | POA: Diagnosis not present

## 2018-05-11 DIAGNOSIS — E785 Hyperlipidemia, unspecified: Secondary | ICD-10-CM | POA: Diagnosis not present

## 2018-05-11 DIAGNOSIS — K219 Gastro-esophageal reflux disease without esophagitis: Secondary | ICD-10-CM | POA: Insufficient documentation

## 2018-05-11 DIAGNOSIS — Z7951 Long term (current) use of inhaled steroids: Secondary | ICD-10-CM | POA: Insufficient documentation

## 2018-05-11 DIAGNOSIS — M5416 Radiculopathy, lumbar region: Secondary | ICD-10-CM | POA: Diagnosis not present

## 2018-05-11 DIAGNOSIS — M533 Sacrococcygeal disorders, not elsewhere classified: Secondary | ICD-10-CM | POA: Diagnosis not present

## 2018-05-11 DIAGNOSIS — M48062 Spinal stenosis, lumbar region with neurogenic claudication: Secondary | ICD-10-CM | POA: Diagnosis not present

## 2018-05-11 DIAGNOSIS — Z6841 Body Mass Index (BMI) 40.0 and over, adult: Secondary | ICD-10-CM | POA: Insufficient documentation

## 2018-05-11 DIAGNOSIS — M1611 Unilateral primary osteoarthritis, right hip: Secondary | ICD-10-CM | POA: Diagnosis not present

## 2018-05-11 DIAGNOSIS — J449 Chronic obstructive pulmonary disease, unspecified: Secondary | ICD-10-CM | POA: Diagnosis not present

## 2018-05-11 DIAGNOSIS — E669 Obesity, unspecified: Secondary | ICD-10-CM | POA: Insufficient documentation

## 2018-05-11 DIAGNOSIS — M5136 Other intervertebral disc degeneration, lumbar region: Secondary | ICD-10-CM

## 2018-05-11 DIAGNOSIS — Z79899 Other long term (current) drug therapy: Secondary | ICD-10-CM | POA: Diagnosis not present

## 2018-05-11 DIAGNOSIS — M79604 Pain in right leg: Secondary | ICD-10-CM | POA: Diagnosis present

## 2018-05-11 DIAGNOSIS — G4730 Sleep apnea, unspecified: Secondary | ICD-10-CM | POA: Insufficient documentation

## 2018-05-11 DIAGNOSIS — M51369 Other intervertebral disc degeneration, lumbar region without mention of lumbar back pain or lower extremity pain: Secondary | ICD-10-CM

## 2018-05-11 DIAGNOSIS — F17210 Nicotine dependence, cigarettes, uncomplicated: Secondary | ICD-10-CM | POA: Diagnosis not present

## 2018-05-11 DIAGNOSIS — Z981 Arthrodesis status: Secondary | ICD-10-CM | POA: Insufficient documentation

## 2018-05-11 DIAGNOSIS — M545 Low back pain: Secondary | ICD-10-CM | POA: Diagnosis present

## 2018-05-11 NOTE — Progress Notes (Signed)
Safety precautions to be maintained throughout the outpatient stay will include: orient to surroundings, keep bed in low position, maintain call bell within reach at all times, provide assistance with transfer out of bed and ambulation.  

## 2018-05-11 NOTE — Patient Instructions (Signed)
____________________________________________________________________________________________  Preparing for Procedure with Sedation  Instructions: . Oral Intake: Do not eat or drink anything for at least 8 hours prior to your procedure. . Transportation: Public transportation is not allowed. Bring an adult driver. The driver must be physically present in our waiting room before any procedure can be started. . Physical Assistance: Bring an adult physically capable of assisting you, in the event you need help. This adult should keep you company at home for at least 6 hours after the procedure. . Blood Pressure Medicine: Take your blood pressure medicine with a sip of water the morning of the procedure. . Blood thinners:  . Diabetics on insulin: Notify the staff so that you can be scheduled 1st case in the morning. If your diabetes requires high dose insulin, take only  of your normal insulin dose the morning of the procedure and notify the staff that you have done so. . Preventing infections: Shower with an antibacterial soap the morning of your procedure. . Build-up your immune system: Take 1000 mg of Vitamin C with every meal (3 times a day) the day prior to your procedure. . Antibiotics: Inform the staff if you have a condition or reason that requires you to take antibiotics before dental procedures. . Pregnancy: If you are pregnant, call and cancel the procedure. . Sickness: If you have a cold, fever, or any active infections, call and cancel the procedure. . Arrival: You must be in the facility at least 30 minutes prior to your scheduled procedure. . Children: Do not bring children with you. . Dress appropriately: Bring dark clothing that you would not mind if they get stained. . Valuables: Do not bring any jewelry or valuables.  Procedure appointments are reserved for interventional treatments only. . No Prescription Refills. . No medication changes will be discussed during procedure  appointments. . No disability issues will be discussed.  Remember:  Regular Business hours are:  Monday to Thursday 8:00 AM to 4:00 PM  Provider's Schedule: Francisco Naveira, MD:  Procedure days: Tuesday and Thursday 7:30 AM to 4:00 PM  Bilal Lateef, MD:  Procedure days: Monday and Wednesday 7:30 AM to 4:00 PM ____________________________________________________________________________________________  Sacroiliac (SI) Joint Injection Patient Information  Description: The sacroiliac joint connects the scrum (very low back and tailbone) to the ilium (a pelvic bone which also forms half of the hip joint).  Normally this joint experiences very little motion.  When this joint becomes inflamed or unstable low back and or hip and pelvis pain may result.  Injection of this joint with local anesthetics (numbing medicines) and steroids can provide diagnostic information and reduce pain.  This injection is performed with the aid of x-ray guidance into the tailbone area while you are lying on your stomach.   You may experience an electrical sensation down the leg while this is being done.  You may also experience numbness.  We also may ask if we are reproducing your normal pain during the injection.  Conditions which may be treated SI injection:   Low back, buttock, hip or leg pain  Preparation for the Injection:  1. Do not eat any solid food or dairy products within 8 hours of your appointment.  2. You may drink clear liquids up to 3 hours before appointment.  Clear liquids include water, black coffee, juice or soda.  No milk or cream please. 3. You may take your regular medications, including pain medications with a sip of water before your appointment.  Diabetics should hold   regular insulin (if take separately) and take 1/2 normal NPH dose the morning of the procedure.  Carry some sugar containing items with you to your appointment. 4. A driver must accompany you and be prepared to drive you  home after your procedure. 5. Bring all of your current medications with you. 6. An IV may be inserted and sedation may be given at the discretion of the physician. 7. A blood pressure cuff, EKG and other monitors will often be applied during the procedure.  Some patients may need to have extra oxygen administered for a short period.  8. You will be asked to provide medical information, including your allergies, prior to the procedure.  We must know immediately if you are taking blood thinners (like Coumadin/Warfarin) or if you are allergic to IV iodine contrast (dye).  We must know if you could possible be pregnant.  Possible side effects:   Bleeding from needle site  Infection (rare, may require surgery)  Nerve injury (rare)  Numbness & tingling (temporary)  A brief convulsion or seizure  Light-headedness (temporary)  Pain at injection site (several days)  Decreased blood pressure (temporary)  Weakness in the leg (temporary)   Call if you experience:   New onset weakness or numbness of an extremity below the injection site that last more than 8 hours.  Hives or difficulty breathing ( go to the emergency room)  Inflammation or drainage at the injection site  Any new symptoms which are concerning to you  Please note:  Although the local anesthetic injected can often make your back/ hip/ buttock/ leg feel good for several hours after the injections, the pain will likely return.  It takes 3-7 days for steroids to work in the sacroiliac area.  You may not notice any pain relief for at least that one week.  If effective, we will often do a series of three injections spaced 3-6 weeks apart to maximally decrease your pain.  After the initial series, we generally will wait some months before a repeat injection of the same type.  If you have any questions, please call [phone] [facility]   

## 2018-05-11 NOTE — Progress Notes (Signed)
Patient's Name: GUSSIE MURTON  MRN: [id number]  Referring Provider: [name], NP  DOB: 12/13/1955  PCP: [name], NP  DOS: 05/11/2018  Note by: Gillis Santa, MD  Service setting: Ambulatory outpatient  Specialty: Interventional Pain Management  Location: [facility] (AMB) Pain Management Facility    Patient type: Established   Primary Reason(s) for Visit: Encounter for post-procedure evaluation of chronic illness with mild to moderate exacerbation CC: Back Pain (right and low) and Leg Pain (right and anterior to foot)  HPI  Ms. Wetherell is a 62 y.o. year old, female patient, who comes today for a post-procedure evaluation. She has Anxiety; Spasm; Panic disorder; Mood disorder of depressed type; DDD (degenerative disc disease), lumbar; COPD, severe ([place]); Hyperlipidemia; Gastroesophageal reflux disease; Radiculopathy, lumbar region; Sleep apnea; Obesity; Benign essential tremor; Chronic pain; Facet syndrome, lumbar; Spinal stenosis, lumbar region, with neurogenic claudication; Status post cervical spinal fusion; DJD (degenerative joint disease) of knee; Tobacco abuse; Benign hypertension; Essential hypertension; Advanced care planning/counseling discussion; and Osteoarthritis of right hip on their problem list. Her primarily concern today is the Back Pain (right and low) and Leg Pain (right and anterior to foot)  Pain Assessment: Location: Lower, Right Back Radiating: right anterior leg Onset: More than a month ago Duration: Chronic pain Quality: Constant, Sharp, Discomfort Severity: 9 /10 (subjective, self-reported pain score)  Note: Reported level is inconsistent with clinical observations.                         When using our objective Pain Scale, levels between 6 and 10/10 are said to belong in an emergency room, as it progressively worsens from a 6/10, described as severely limiting, requiring emergency care not usually available at an outpatient pain management facility. At a 6/10  level, communication becomes difficult and requires great effort. Assistance to reach the emergency department may be required. Facial flushing and profuse sweating along with potentially dangerous increases in heart rate and blood pressure will be evident. Effect on ADL:   Timing: Constant Modifying factors: rest,  BP: (!) 119/54  HR: 84  Ms. Lepkowski comes in today for post-procedure evaluation after the treatment done on 04/19/2018.  Further details on both, my assessment(s), as well as the proposed treatment plan, please see below.  Post-Procedure Assessment  04/18/2018 Procedure: RIGHT L4-L5 ESI Pre-procedure pain score:  9/10 Post-procedure pain score: 5/10         Influential Factors: BMI: 46.41 kg/m Intra-procedural challenges: None observed.         Assessment challenges: None detected.              Reported side-effects: None.        Post-procedural adverse reactions or complications: None reported         Sedation: Please see nurses note. When no sedatives are used, the analgesic levels obtained are directly associated to the effectiveness of the local anesthetics. However, when sedation is provided, the level of analgesia obtained during the initial 1 hour following the intervention, is believed to be the result of a combination of factors. These factors may include, but are not limited to: 1. The effectiveness of the local anesthetics used. 2. The effects of the analgesic(s) and/or anxiolytic(s) used. 3. The degree of discomfort experienced by the patient at the time of the procedure. 4. The patients ability and reliability in recalling and recording the events. 5. The presence and influence of possible secondary gains and/or psychosocial factors. Reported result: Relief  experienced during the 1st hour after the procedure: 50 % (Ultra-Short Term Relief)            Interpretative annotation: Clinically appropriate result. Analgesia during this period is likely to be Local  Anesthetic and/or IV Sedative (Analgesic/Anxiolytic) related.          Effects of local anesthetic: The analgesic effects attained during this period are directly associated to the localized infiltration of local anesthetics and therefore cary significant diagnostic value as to the etiological location, or anatomical origin, of the pain. Expected duration of relief is directly dependent on the pharmacodynamics of the local anesthetic used. Long-acting (4-6 hours) anesthetics used.  Reported result: Relief during the next 4 to 6 hour after the procedure: 50 % (Short-Term Relief)            Interpretative annotation: Clinically appropriate result. Analgesia during this period is likely to be Local Anesthetic-related.          Long-term benefit: Defined as the period of time past the expected duration of local anesthetics (1 hour for short-acting and 4-6 hours for long-acting). With the possible exception of prolonged sympathetic blockade from the local anesthetics, benefits during this period are typically attributed to, or associated with, other factors such as analgesic sensory neuropraxia, antiinflammatory effects, or beneficial biochemical changes provided by agents other than the local anesthetics.  Reported result: Extended relief following procedure: 10 % (Long-Term Relief)            Interpretative annotation: Unexpected result. No long-term benefit. No permanent benefit expected. Inflammation plays a part in the etiology to the pain.          Current benefits: Defined as reported results that persistent at this point in time.   Analgesia: 0-25 %            Function: Back to baseline ROM: Back to baseline Interpretative annotation: Recurrence of symptoms. No permanent benefit expected. Results would suggest persistent aggravating factors.          Interpretation: Results would suggest therapy to have a limited impact on the patient's condition.                  Plan:  Please see "Plan of  Care" for details.                Laboratory Chemistry  Inflammation Markers (CRP: Acute Phase) (ESR: Chronic Phase) No results found for: CRP, ESRSEDRATE, LATICACIDVEN                       Rheumatology Markers Lab Results  Component Value Date   LABURIC 7.3 (H) 07/26/2015                        Renal Function Markers Lab Results  Component Value Date   BUN 13 12/27/2017   CREATININE 1.08 (H) 12/27/2017   BCR 12 12/27/2017   GFRAA 64 12/27/2017   GFRNONAA 55 (L) 12/27/2017                             Hepatic Function Markers Lab Results  Component Value Date   AST 20 12/27/2017   ALT 19 12/27/2017   ALBUMIN 4.1 12/27/2017   ALKPHOS 109 12/27/2017                        Electrolytes Lab Results  Component  Value Date   NA 141 12/27/2017   K 4.5 12/27/2017   CL 103 12/27/2017   CALCIUM 9.3 12/27/2017                        Neuropathy Markers Lab Results  Component Value Date   HIV Non Reactive 07/26/2015                        Bone Pathology Markers No results found for: [place], [id number], [id number], [id number], 25OHVITD1, 25OHVITD2, 25OHVITD3, TESTOFREE, TESTOSTERONE                       Coagulation Parameters Lab Results  Component Value Date   INR 0.9 03/23/2014   LABPROT 12.5 03/23/2014   APTT 30 05/13/2007   PLT 313 12/27/2017                        Cardiovascular Markers Lab Results  Component Value Date   HGB 12.6 12/27/2017   HCT 39.2 12/27/2017                         CA Markers No results found for: CEA, CA125, LABCA2                      Note: Lab results reviewed.  Recent Diagnostic Imaging Results  DG C-Arm 1-60 Min-No Report Fluoroscopy was utilized by the requesting physician.  No radiographic  interpretation.   Complexity Note: Imaging results reviewed. Results shared with Ms. Keough, using Layman's terms.                         Meds   Current Outpatient Medications:  .  amitriptyline (ELAVIL) 25 MG tablet, Take 2  tablets (50 mg total) by mouth at bedtime., Disp: 60 tablet, Rfl: 3 .  aspirin 81 MG tablet, Take 81 mg by mouth daily., Disp: , Rfl:  .  atorvastatin (LIPITOR) 40 MG tablet, Take 1 tablet (40 mg total) by mouth daily., Disp: 90 tablet, Rfl: 3 .  buPROPion (WELLBUTRIN XL) 300 MG 24 hr tablet, Take 1 tablet (300 mg total) by mouth daily., Disp: 90 tablet, Rfl: 1 .  busPIRone (BUSPAR) 10 MG tablet, TAKE 1 TABLET BY MOUTH TWICE DAILY, Disp: 180 tablet, Rfl: 0 .  Calcium Citrate-Vitamin D (CALCIUM + D PO), Take 1,000 mg by mouth daily., Disp: , Rfl:  .  carisoprodol (SOMA) 350 MG tablet, TAKE 1 TABLET BY MOUTH 2 TO 3  TIMES DAILY IF  TOLERATE, Disp: 90 tablet, Rfl: 0 .  DULoxetine (CYMBALTA) 60 MG capsule, Take 1 capsule (60 mg total) by mouth daily., Disp: 90 capsule, Rfl: 3 .  furosemide (LASIX) 20 MG tablet, Take 20 mg by mouth daily., Disp: , Rfl:  .  gabapentin (NEURONTIN) 800 MG tablet, Take 1 tablet (800 mg total) by mouth 3 (three) times daily., Disp: 360 tablet, Rfl: 0 .  hydrOXYzine (ATARAX/VISTARIL) 10 MG tablet, Take 1 tablet (10 mg total) by mouth 3 (three) times daily as needed., Disp: 90 tablet, Rfl: 1 .  ibuprofen (ADVIL,MOTRIN) 200 MG tablet, Take 800 mg by mouth every 4 (four) hours as needed., Disp: , Rfl:  .  Multiple Vitamins-Minerals (MULTIVITAMIN WITH MINERALS) tablet, Take 1 tablet by mouth daily., Disp: , Rfl:  .  omeprazole (PRILOSEC) 40 MG capsule, Take  1 capsule (40 mg total) by mouth daily., Disp: 90 capsule, Rfl: 3 .  potassium chloride SA (KLOR-CON M20) 20 MEQ tablet, Take 1 tablet (20 mEq total) by mouth daily., Disp: 90 tablet, Rfl: 3 .  PROAIR HFA 108 (90 Base) MCG/ACT inhaler, INHALE 2 PUFFS BY MOUTH EVERY 6 HOURS, Disp: 8.5 g, Rfl: 0 .  telmisartan-hydrochlorothiazide (MICARDIS HCT) 40-12.5 MG tablet, TAKE 1 TABLET BY MOUTH ONCE DAILY, Disp: 30 tablet, Rfl: 6 .  tiotropium (SPIRIVA HANDIHALER) 18 MCG inhalation capsule, Place 1 capsule (18 mcg total) into inhaler and  inhale daily., Disp: 90 capsule, Rfl: 3 .  baclofen (LIORESAL) 20 MG tablet, Take 1 tablet (20 mg total) by mouth 3 (three) times daily. (Patient not taking: Reported on 05/11/2018), Disp: 270 each, Rfl: 0 .  ondansetron (ZOFRAN) 4 MG tablet, Take 1 tablet (4 mg total) by mouth every 8 (eight) hours as needed for nausea or vomiting. (Patient not taking: Reported on 05/11/2018), Disp: 20 tablet, Rfl: 0  Current Facility-Administered Medications:  .  bupivacaine (PF) (MARCAINE) 0.25 % injection 30 mL, 30 mL, Other, Once, Mohammed Kindle, MD .  bupivacaine (PF) (MARCAINE) 0.25 % injection 30 mL, 30 mL, Other, Once, Mohammed Kindle, MD .  bupivacaine (PF) (MARCAINE) 0.25 % injection 30 mL, 30 mL, Other, Once, Mohammed Kindle, MD .  bupivacaine (PF) (MARCAINE) 0.25 % injection 30 mL, 30 mL, Other, Once, Mohammed Kindle, MD .  ceFAZolin (ANCEF) IVPB 1 g/50 mL premix, 1 g, Intravenous, Once, Mohammed Kindle, MD .  ceFAZolin (ANCEF) IVPB 1 g/50 mL premix, 1 g, Intravenous, Once, Mohammed Kindle, MD .  ceFAZolin (ANCEF) IVPB 1 g/50 mL premix, 1 g, Intravenous, Once, Mohammed Kindle, MD .  fentaNYL (SUBLIMAZE) injection 100 mcg, 100 mcg, Intravenous, Once, Mohammed Kindle, MD .  fentaNYL (SUBLIMAZE) injection 100 mcg, 100 mcg, Intravenous, Once, Mohammed Kindle, MD .  lactated ringers infusion 1,000 mL, 1,000 mL, Intravenous, Continuous, Mohammed Kindle, MD .  lactated ringers infusion 1,000 mL, 1,000 mL, Intravenous, Continuous, Mohammed Kindle, MD .  lactated ringers infusion 1,000 mL, 1,000 mL, Intravenous, Continuous, Mohammed Kindle, MD .  lactated ringers infusion 1,000 mL, 1,000 mL, Intravenous, Continuous, Mohammed Kindle, MD .  lidocaine (PF) (XYLOCAINE) 1 % injection 10 mL, 10 mL, Subcutaneous, Once, Mohammed Kindle, MD .  lidocaine (PF) (XYLOCAINE) 1 % injection 10 mL, 10 mL, Subcutaneous, Once, Mohammed Kindle, MD .  midazolam (VERSED) 5 MG/5ML injection 5 mg, 5 mg, Intravenous, Once, Mohammed Kindle, MD .   midazolam (VERSED) 5 MG/5ML injection 5 mg, 5 mg, Intravenous, Once, Mohammed Kindle, MD .  orphenadrine (NORFLEX) injection 60 mg, 60 mg, Intramuscular, Once, Mohammed Kindle, MD .  orphenadrine (NORFLEX) injection 60 mg, 60 mg, Intramuscular, Once, Mohammed Kindle, MD .  orphenadrine (NORFLEX) injection 60 mg, 60 mg, Intramuscular, Once, Mohammed Kindle, MD .  sodium chloride flush (NS) 0.9 % injection 20 mL, 20 mL, Other, Once, Mohammed Kindle, MD .  sodium chloride flush (NS) 0.9 % injection 20 mL, 20 mL, Other, Once, Mohammed Kindle, MD .  triamcinolone acetonide (KENALOG-40) injection 40 mg, 40 mg, Other, Once, Mohammed Kindle, MD .  triamcinolone acetonide (KENALOG-40) injection 40 mg, 40 mg, Other, Once, Mohammed Kindle, MD .  triamcinolone acetonide (KENALOG-40) injection 40 mg, 40 mg, Other, Once, Mohammed Kindle, MD  ROS  Constitutional: Denies any fever or chills Gastrointestinal: No reported hemesis, hematochezia, vomiting, or acute GI distress Musculoskeletal: Denies any acute onset joint swelling, redness, loss of ROM, or weakness Neurological: No  reported episodes of acute onset apraxia, aphasia, dysarthria, agnosia, amnesia, paralysis, loss of coordination, or loss of consciousness  Allergies  Ms. Darwin has No Known Allergies.  [place]  Drug: Ms. Jane  reports that she does not use drugs. Alcohol:  reports that she does not drink alcohol. Tobacco:  reports that she has been smoking cigarettes.  She has a 46.00 pack-year smoking history. She has never used smokeless tobacco. Medical:  has a past medical history of Arthritis, COPD (chronic obstructive pulmonary disease) ([place]), Enlarged heart, GERD (gastroesophageal reflux disease), Hiatal hernia, degenerative disc disease, Hyperlipidemia, Hypertension, Mass, Osteoporosis, and Sleep apnea. Surgical: Ms. Cocke  has a past surgical history that includes Appendectomy; Tonsillectomy; Abdominal hysterectomy; bone fusion in neck; Achilles  tendon repair; and Hernia repair. Family: family history includes Breast cancer in her paternal aunt and paternal aunt; COPD in her father; Cancer in her brother and sister; Dementia in her father; Heart attack in her brother; Heart disease in her father; Hyperlipidemia in her father and sister; Hypertension in her father and sister; Lymphoma in her sister; Migraines in her son; Stroke in her maternal grandfather.  Constitutional Exam  General appearance: Well nourished, well developed, and well hydrated. In no apparent acute distress Vitals:   05/11/18 1113  BP: (!) 119/54  Pulse: 84  Resp: 20  Temp: 98.6 F (37 C)  TempSrc: Oral  SpO2: 97%  Weight: 262 lb (118.8 kg)  Height: 5' 3" (1.6 m)   BMI Assessment: Estimated body mass index is 46.41 kg/m as calculated from the following:   Height as of this encounter: 5' 3" (1.6 m).   Weight as of this encounter: 262 lb (118.8 kg).  BMI interpretation table: BMI level Category Range association with higher incidence of chronic pain  <18 kg/m2 Underweight   18.5-24.9 kg/m2 Ideal body weight   25-29.9 kg/m2 Overweight Increased incidence by 20%  30-34.9 kg/m2 Obese (Class I) Increased incidence by 68%  35-39.9 kg/m2 Severe obesity (Class II) Increased incidence by 136%  >40 kg/m2 Extreme obesity (Class III) Increased incidence by 254%   Patient's current BMI Ideal Body weight  Body mass index is 46.41 kg/m. Ideal body weight: 52.4 kg (115 lb 8.3 oz) Adjusted ideal body weight: 79 kg (174 lb 1.8 oz)   BMI Readings from Last 4 Encounters:  05/11/18 46.41 kg/m  04/18/18 46.41 kg/m  03/28/18 47.37 kg/m  02/17/18 46.59 kg/m   Wt Readings from Last 4 Encounters:  05/11/18 262 lb (118.8 kg)  04/18/18 262 lb (118.8 kg)  03/28/18 267 lb 6.4 oz (121.3 kg)  02/17/18 263 lb (119.3 kg)  Psych/Mental status: Alert, oriented x 3 (person, place, & time)       Eyes: PERLA Respiratory: No evidence of acute respiratory distress  Cervical  Spine Area Exam  Skin & Axial Inspection: No masses, redness, edema, swelling, or associated skin lesions Alignment: Symmetrical Functional ROM: Unrestricted ROM      Stability: No instability detected Muscle Tone/Strength: Functionally intact. No obvious neuro-muscular anomalies detected. Sensory (Neurological): Unimpaired Palpation: No palpable anomalies              Upper Extremity (UE) Exam    Side: Right upper extremity  Side: Left upper extremity  Skin & Extremity Inspection: Skin color, temperature, and hair growth are WNL. No peripheral edema or cyanosis. No masses, redness, swelling, asymmetry, or associated skin lesions. No contractures.  Skin & Extremity Inspection: Skin color, temperature, and hair growth are WNL. No peripheral edema or  cyanosis. No masses, redness, swelling, asymmetry, or associated skin lesions. No contractures.  Functional ROM: Unrestricted ROM          Functional ROM: Unrestricted ROM          Muscle Tone/Strength: Functionally intact. No obvious neuro-muscular anomalies detected.  Muscle Tone/Strength: Functionally intact. No obvious neuro-muscular anomalies detected.  Sensory (Neurological): Unimpaired          Sensory (Neurological): Unimpaired          Palpation: No palpable anomalies              Palpation: No palpable anomalies              Provocative Test(s):  Phalen's test: deferred Tinel's test: deferred Apley's scratch test (touch opposite shoulder):  Action 1 (Across chest): deferred Action 2 (Overhead): deferred Action 3 (LB reach): deferred   Provocative Test(s):  Phalen's test: deferred Tinel's test: deferred Apley's scratch test (touch opposite shoulder):  Action 1 (Across chest): deferred Action 2 (Overhead): deferred Action 3 (LB reach): deferred    Thoracic Spine Area Exam  Skin & Axial Inspection: No masses, redness, or swelling Alignment: Symmetrical Functional ROM: Unrestricted ROM Stability: No instability detected Muscle  Tone/Strength: Functionally intact. No obvious neuro-muscular anomalies detected. Sensory (Neurological): Unimpaired Muscle strength & Tone: No palpable anomalies  Lumbar Spine Area Exam  Skin & Axial Inspection: No masses, redness, or swelling Alignment: Symmetrical Functional ROM: Decreased ROM affecting primarily the right Stability: No instability detected Muscle Tone/Strength: Functionally intact. No obvious neuro-muscular anomalies detected. Sensory (Neurological): Musculoskeletal pain pattern Palpation: Complains of area being tender to palpation       Provocative Tests: Lumbar Hyperextension/rotation test: (+) bilaterally for facet joint pain. Lumbar quadrant test (Kemp's test): (+) bilateral for foraminal stenosis Lumbar Lateral bending test: deferred today       Patrick's Maneuver: (+) for bilateral S-I arthralgia and for bilateral hip arthralgia FABER test: (+) for bilateral S-I arthralgia Thigh-thrust test: deferred today       S-I compression test: deferred today       S-I distraction test: deferred today        Gait & Posture Assessment  Ambulation: Patient ambulates using a walker Gait: Antalgic Posture: Difficulty standing up straight, due to pain   Lower Extremity Exam    Side: Right lower extremity  Side: Left lower extremity  Stability: No instability observed          Stability: No instability observed          Skin & Extremity Inspection: Skin color, temperature, and hair growth are WNL. No peripheral edema or cyanosis. No masses, redness, swelling, asymmetry, or associated skin lesions. No contractures.  Skin & Extremity Inspection: Skin color, temperature, and hair growth are WNL. No peripheral edema or cyanosis. No masses, redness, swelling, asymmetry, or associated skin lesions. No contractures.  Functional ROM: Unrestricted ROM                  Functional ROM: Unrestricted ROM                  Muscle Tone/Strength: Functionally intact. No obvious  neuro-muscular anomalies detected.  Muscle Tone/Strength: Functionally intact. No obvious neuro-muscular anomalies detected.  Sensory (Neurological): Unimpaired  Sensory (Neurological): Unimpaired  Palpation: No palpable anomalies  Palpation: No palpable anomalies   Assessment  Primary Diagnosis & Pertinent Problem List: The primary encounter diagnosis was Sacroiliac joint dysfunction. Diagnoses of Lumbar radiculopathy, Chronic pain syndrome, DDD (degenerative disc disease),  lumbar, and Spinal stenosis, lumbar region, with neurogenic claudication were also pertinent to this visit.  Status Diagnosis  Persistent Persistent Persistent 1. Sacroiliac joint dysfunction   2. Lumbar radiculopathy   3. Chronic pain syndrome   4. DDD (degenerative disc disease), lumbar   5. Spinal stenosis, lumbar region, with neurogenic claudication      General Recommendations: The pain condition that the patient suffers from is best treated with a multidisciplinary approach that involves an increase in physical activity to prevent de-conditioning and worsening of the pain cycle, as well as psychological counseling (formal and/or informal) to address the co-morbid psychological affects of pain. Treatment will often involve judicious use of pain medications and interventional procedures to decrease the pain, allowing the patient to participate in the physical activity that will ultimately produce long-lasting pain reductions. The goal of the multidisciplinary approach is to return the patient to a higher level of overall function and to restore their ability to perform activities of daily living.  62 year old female who presents with a chief complaint of axial low back pain that radiates into her right hip and then to her anterior thigh to her groin and then down to her knees.Patient does have a history of a cervical discectomy and fusion but she is not endorsing any significant neck pain. Of note, she was previously  a pain patient at this clinic in the past with Dr.Crisp. She describes numbness and tingling along her right hip and anterior thigh. Her lumbar MRI is significant for L4-L5 disc extrusion on the left abutting the left anterior thecal sac and left-sided nerve roots along with moderate degenerative facet hypertrophy and moderate lateral recess foraminal stenosis. She also has moderate degenerative facet changes right greater than left at L5-S1. Patient is on gabapentin 800 mill grams 3 times daily, Soma 3 times a day, Cymbalta 60 mg daily. She is not on any opioid therapy. Last opioid prescription was for hydrocodone 10 mg, quantity 120, last filled 09/08/2016. Patient will not be a candidate for opioid therapy given her intake of Soma and its associated increased risk of respiratory depression and cardiorespiratory events when taken concomitantly with opioid medications. We will focus on non-opioid analgesics given the patient's chronic use of Soma which she states will be difficult for her to discontinue.   Patient follows up today status post right L4-L5 epidural steroid injection performed on 04/18/2018.  Patient states that the second epidural steroid injection was not as helpful as her first 1 on 01/19/2018.  She still endorsing pain in her right thigh and right calf region.  In addition patient is also having worsening axial low back pain and buttock pain that is radiating to her groin.  This could be related to SI joint pathology.  Patient does have positive Patrick's and Faber's bilaterally further pointing to SI joint pathology.  We discussed bilateral SI joint injections with sedation and fluoroscopy.  Risks and benefits were reviewed patient would like to proceed.  Plan: -Bilateral SI joint injection with sedation under fluoroscopy. -Continue amitriptyline as prescribed.  Lab-work, procedure(s), and/or referral(s): Orders Placed This Encounter  Procedures  . SACROILIAC JOINT INJECTION     Provider-requested follow-up: Return in about 1 week (around 05/18/2018) for Procedure. Time Note: Greater than 50% of the 25 minute(s) of face-to-face time spent with Ms. Vaccarella, was spent in counseling/coordination of care regarding: Ms. Polgar primary cause of pain, the treatment plan, treatment alternatives, the risks and possible complications of proposed treatment, going over the informed consent,  the results, interpretation and significance of  her recent diagnostic interventional treatment(s), realistic expectations, the goals of pain management (increased in functionality) and the need to bring and keep the BMI below 30.  Future Appointments  Date Time Provider [place]  06/28/2018  1:30 PM [name], NP [facility]    Primary Care Physician: [name], NP Location: [facility] Note by: Gillis Santa, M.D Date: 05/11/2018; Time: 3:14 PM  Patient Instructions  ____________________________________________________________________________________________  Preparing for Procedure with Sedation  Instructions: . Oral Intake: Do not eat or drink anything for at least 8 hours prior to your procedure. . Transportation: Public transportation is not allowed. Bring an adult driver. The driver must be physically present in our waiting room before any procedure can be started. Marland Kitchen Physical Assistance: Bring an adult physically capable of assisting you, in the event you need help. This adult should keep you company at home for at least 6 hours after the procedure. . Blood Pressure Medicine: Take your blood pressure medicine with a sip of water the morning of the procedure. . Blood thinners:  . Diabetics on insulin: Notify the staff so that you can be scheduled 1st case in the morning. If your diabetes requires high dose insulin, take only  of your normal insulin dose the morning of the procedure and notify the staff that you have done  so. . Preventing infections: Shower with an antibacterial soap the morning of your procedure. . Build-up your immune system: Take 1000 mg of Vitamin C with every meal (3 times a day) the day prior to your procedure. Marland Kitchen Antibiotics: Inform the staff if you have a condition or reason that requires you to take antibiotics before dental procedures. . Pregnancy: If you are pregnant, call and cancel the procedure. . Sickness: If you have a cold, fever, or any active infections, call and cancel the procedure. . Arrival: You must be in the facility at least 30 minutes prior to your scheduled procedure. . Children: Do not bring children with you. . Dress appropriately: Bring dark clothing that you would not mind if they get stained. . Valuables: Do not bring any jewelry or valuables.  Procedure appointments are reserved for interventional treatments only. Marland Kitchen No Prescription Refills. . No medication changes will be discussed during procedure appointments. . No disability issues will be discussed.  Remember:  Regular Business hours are:  Monday to Thursday 8:00 AM to 4:00 PM  Provider's Schedule: Milinda Pointer, MD:  Procedure days: Tuesday and Thursday 7:30 AM to 4:00 PM  Gillis Santa, MD:  Procedure days: Monday and Wednesday 7:30 AM to 4:00 PM ____________________________________________________________________________________________  Sacroiliac (SI) Joint Injection Patient Information  Description: The sacroiliac joint connects the scrum (very low back and tailbone) to the ilium (a pelvic bone which also forms half of the hip joint).  Normally this joint experiences very little motion.  When this joint becomes inflamed or unstable low back and or hip and pelvis pain may result.  Injection of this joint with local anesthetics (numbing medicines) and steroids can provide diagnostic information and reduce pain.  This injection is performed with the aid of x-ray guidance into the tailbone area  while you are lying on your stomach.   You may experience an electrical sensation down the leg while this is being done.  You may also experience numbness.  We also may ask if we are reproducing your normal pain during the injection.  Conditions which may be treated SI injection:  Low back, buttock, hip or leg pain  Preparation for the Injection:  1. Do not eat any solid food or dairy products within 8 hours of your appointment.  2. You may drink clear liquids up to 3 hours before appointment.  Clear liquids include water, black coffee, juice or soda.  No milk or cream please. 3. You may take your regular medications, including pain medications with a sip of water before your appointment.  Diabetics should hold regular insulin (if take separately) and take 1/2 normal NPH dose the morning of the procedure.  Carry some sugar containing items with you to your appointment. 4. A driver must accompany you and be prepared to drive you home after your procedure. 5. Bring all of your current medications with you. 6. An IV may be inserted and sedation may be given at the discretion of the physician. 7. A blood pressure cuff, EKG and other monitors will often be applied during the procedure.  Some patients may need to have extra oxygen administered for a short period.  8. You will be asked to provide medical information, including your allergies, prior to the procedure.  We must know immediately if you are taking blood thinners (like Coumadin/Warfarin) or if you are allergic to IV iodine contrast (dye).  We must know if you could possible be pregnant.  Possible side effects:   Bleeding from needle site  Infection (rare, may require surgery)  Nerve injury (rare)  Numbness & tingling (temporary)  A brief convulsion or seizure  Light-headedness (temporary)  Pain at injection site (several days)  Decreased blood pressure (temporary)  Weakness in the leg (temporary)   Call if you  experience:   New onset weakness or numbness of an extremity below the injection site that last more than 8 hours.  Hives or difficulty breathing ( go to the emergency room)  Inflammation or drainage at the injection site  Any new symptoms which are concerning to you  Please note:  Although the local anesthetic injected can often make your back/ hip/ buttock/ leg feel good for several hours after the injections, the pain will likely return.  It takes 3-7 days for steroids to work in the sacroiliac area.  You may not notice any pain relief for at least that one week.  If effective, we will often do a series of three injections spaced 3-6 weeks apart to maximally decrease your pain.  After the initial series, we generally will wait some months before a repeat injection of the same type.  If you have any questions, please call [phone] [facility]

## 2018-05-18 ENCOUNTER — Ambulatory Visit
Admission: RE | Admit: 2018-05-18 | Discharge: 2018-05-18 | Disposition: A | Discharge status: Home / Self Care | Payer: BLUE CROSS/BLUE SHIELD | Source: Ambulatory Visit | Attending: Student in an Organized Health Care Education/Training Program | Admitting: Student in an Organized Health Care Education/Training Program

## 2018-05-18 ENCOUNTER — Encounter: Payer: Self-pay | Admitting: Student in an Organized Health Care Education/Training Program

## 2018-05-18 ENCOUNTER — Other Ambulatory Visit: Payer: Self-pay

## 2018-05-18 ENCOUNTER — Ambulatory Visit (HOSPITAL_BASED_OUTPATIENT_CLINIC_OR_DEPARTMENT_OTHER): Payer: BLUE CROSS/BLUE SHIELD | Admitting: Student in an Organized Health Care Education/Training Program

## 2018-05-18 DIAGNOSIS — M545 Low back pain: Secondary | ICD-10-CM | POA: Diagnosis not present

## 2018-05-18 DIAGNOSIS — Z7982 Long term (current) use of aspirin: Secondary | ICD-10-CM | POA: Diagnosis not present

## 2018-05-18 DIAGNOSIS — Z79899 Other long term (current) drug therapy: Secondary | ICD-10-CM | POA: Diagnosis not present

## 2018-05-18 DIAGNOSIS — M533 Sacrococcygeal disorders, not elsewhere classified: Secondary | ICD-10-CM

## 2018-05-18 DIAGNOSIS — M25569 Pain in unspecified knee: Secondary | ICD-10-CM | POA: Diagnosis not present

## 2018-05-18 MED ORDER — LACTATED RINGERS IV SOLN
1000.0000 mL | Freq: Once | INTRAVENOUS | Status: AC
Start: 2018-05-18 — End: 2018-05-18
  Administered 2018-05-18: 1000 mL via INTRAVENOUS

## 2018-05-18 MED ORDER — ROPIVACAINE HCL 2 MG/ML IJ SOLN
10.0000 mL | Freq: Once | INTRAMUSCULAR | Status: AC
  Administered 2018-05-18: 10 mL
  Filled 2018-05-18: qty 10

## 2018-05-18 MED ORDER — DEXAMETHASONE SODIUM PHOSPHATE 10 MG/ML IJ SOLN
10.0000 mg | Freq: Once | INTRAMUSCULAR | Status: AC
  Administered 2018-05-18: 10 mg
  Filled 2018-05-18: qty 1

## 2018-05-18 MED ORDER — FENTANYL CITRATE (PF) 100 MCG/2ML IJ SOLN
25.0000 ug | INTRAMUSCULAR | Status: DC | PRN
  Administered 2018-05-18: 50 ug via INTRAVENOUS
  Filled 2018-05-18: qty 2

## 2018-05-18 MED ORDER — LIDOCAINE HCL 1 % IJ SOLN
10.0000 mL | Freq: Once | INTRAMUSCULAR | Status: DC
  Filled 2018-05-18: qty 10

## 2018-05-18 NOTE — Patient Instructions (Signed)
____________________________________________________________________________________________  Post-procedure Information What to expect: Most procedures involve the use of a local anesthetic (numbing medicine), and a steroid (anti-inflammatory medicine).  The local anesthetics may cause temporary numbness and weakness of the legs or arms, depending on the location of the block. This numbness/weakness may last 4-6 hours, depending on the local anesthetic used. In rare instances, it can last up to 24 hours. While numb, you must be very careful not to injure the extremity.  After any procedure, you could expect the pain to get better within 15-20 minutes. This relief is temporary and may last 4-6 hours. Once the local anesthetics wears off, you could experience discomfort, possibly more than usual, for up to 10 (ten) days. In the case of radiofrequencies, it may last up to 6 weeks. Surgeries may take up to 8 weeks for the healing process. The discomfort is due to the irritation caused by needles going through skin and muscle. To minimize the discomfort, we recommend using ice the first day, and heat from then on. The ice should be applied for 15 minutes on, and 15 minutes off. Keep repeating this cycle until bedtime. Avoid applying the ice directly to the skin, to prevent frostbite. Heat should be used daily, until the pain improves (4-10 days). Be careful not to burn yourself.  Occasionally you may experience muscle spasms or cramps. These occur as a consequence of the irritation caused by the needle sticks to the muscle and the blood that will inevitably be lost into the surrounding muscle tissue. Blood tends to be very irritating to tissues, which tend to react by going into spasm. These spasms may start the same day of your procedure, but they may also take days to develop. This late onset type of spasm or cramp is usually caused by electrolyte imbalances triggered by the steroids, at the level of the  kidney. Cramps and spasms tend to respond well to muscle relaxants, multivitamins (some are triggered by the procedure, but may have their origins in vitamin deficiencies), and "Gatorade", or any sports drinks that can replenish any electrolyte imbalances. (If you are a diabetic, ask your pharmacist to get you a sugar-free brand.) Warm showers or baths may also be helpful. Stretching exercises are highly recommended.  General Instructions:  Be alert for signs of possible infection: redness, swelling, heat, red streaks, elevated temperature, and/or fever. These typically appear 4 to 6 days after the procedure. Immediately notify your doctor if you experience unusual bleeding, difficulty breathing, or loss of bowel or bladder control. If you experience increased pain, do not increase your pain medicine intake, unless instructed by your pain physician.  Post-Procedure Care:  Be careful in moving about. Muscle spasms in the area of the injection may occur. Applying ice or heat to the area is often helpful. The incidence of spinal headaches after epidural injections ranges between 1.4% and 6%. If you develop a headache that does not seem to respond to conservative therapy, please let your physician know. This can be treated with an epidural blood patch.   Post-procedure numbness or redness is to be expected, however it should average 4 to 6 hours. If numbness and weakness of your extremities begins to develop 4 to 6 hours after your procedure, and is felt to be progressing and worsening, immediately contact your physician.  Diet:  If you experience nausea, do not eat until this sensation goes away. If you had a "Stellate Ganglion Block" for upper extremity "Reflex Sympathetic Dystrophy", do not eat or   drink until your hoarseness goes away. In any case, always start with liquids first and if you tolerate them well, then slowly progress to more solid foods.  Activity:  For the first 4 to 6 hours after the  procedure, use caution in moving about as you may experience numbness and/or weakness. Use caution in cooking, using household electrical appliances, and climbing steps. If you need to reach your Doctor call our office: [phone] (During business hours) or [phone] (After business hours).  Business Hours: Monday-Thursday 8:00 am - 4:00 PM    Fridays: Closed     In case of an emergency: In case of emergency, call 911 or go to the nearest emergency room and have the physician there call us.  Interpretation of Procedure Every nerve block has two components: a diagnostic component, and a treatment component. Unrealistic expectations are the most common causes of "perceived failure".  In a perfect world, a single nerve block should be able to completely and permanently eliminate the pain. Sadly, the world is not perfect.  Most pain management nerve blocks are performed using local anesthetics and steroids. Steroids are responsible for any long-term benefit that you may experience. Their purpose is to decrease any chronic swelling that may exist in the area. Steroids begin to work immediately after being injected. However, most patients will not experience any benefits until 5 to 10 days after the injection, when the swelling has come down to the point where they can tell a difference. Steroids will only help if there is swelling to be treated. As such, they can assist with the diagnosis. If effective, they suggest an inflammatory component to the pain, and if ineffective, they rule out inflammation as the main cause or component of the problem. If the problem is one of mechanical compression, you will get no benefit from those steroids.   In the case of local anesthetics, they have a crucial role in the diagnosis of your condition. Most will begin to work within15 to 20 minutes after injection. The duration will depend on the type used (short- vs. Long-acting). It is of outmost importance that  patients keep tract of their pain, after the procedure. To assist with this matter, a "Post-procedure Pain Diary" is provided. Make sure to complete it and to bring it back to your follow-up appointment.  As long as the patient keeps accurate, detailed records of their symptoms after every procedure, and returns to have those interpreted, every procedure will provide [place] with invaluable information. Even a block that does not provide the patient with any relief, will always provide [place] with information about the mechanism and the origin of the pain. The only time a nerve block can be considered a waste of time is when patients do not keep track of the results, or do not keep their post-procedure appointment.  ____________________________________________________________________________________________  Post-Procedure Discharge Instructions  Instructions:  Apply ice: Fill a plastic sandwich bag with crushed ice. Cover it with a small towel and apply to injection site. Apply for 15 minutes then remove x 15 minutes. Repeat sequence on day of procedure, until you go to bed. The purpose is to minimize swelling and discomfort after procedure.  Apply heat: Apply heat to procedure site starting the day following the procedure. The purpose is to treat any soreness and discomfort from the procedure.  Food intake: Start with clear liquids (like water) and advance to regular food, as tolerated.   Physical activities: Keep activities to a minimum for the  first 8 hours after the procedure.   Driving: If you have received any sedation, you are not allowed to drive for 24 hours after your procedure.  Blood thinner: Restart your blood thinner 6 hours after your procedure. (Only for those taking blood thinners)  Insulin: As soon as you can eat, you may resume your normal dosing schedule. (Only for those taking insulin)  Infection prevention: Keep procedure site clean and dry.  Post-procedure Pain Diary:  Extremely important that this be done correctly and accurately. Recorded information will be used to determine the next step in treatment.  Pain evaluated is that of treated area only. Do not include pain from an untreated area.  Complete every hour, on the hour, for the initial 8 hours. Set an alarm to help you do this part accurately.  Do not go to sleep and have it completed later. It will not be accurate.  Follow-up appointment: Keep your follow-up appointment after the procedure. Usually 2 weeks for most procedures. (6 weeks in the case of radiofrequency.) Bring you pain diary.   Expect:  From numbing medicine (AKA: Local Anesthetics): Numbness or decrease in pain.  Onset: Full effect within 15 minutes of injected.  Duration: It will depend on the type of local anesthetic used. On the average, 1 to 8 hours.   From steroids: Decrease in swelling or inflammation. Once inflammation is improved, relief of the pain will follow.  Onset of benefits: Depends on the amount of swelling present. The more swelling, the longer it will take for the benefits to be seen. In some cases, up to 10 days.  Duration: Steroids will stay in the system x 2 weeks. Duration of benefits will depend on multiple posibilities including persistent irritating factors.  From procedure: Some discomfort is to be expected once the numbing medicine wears off. This should be minimal if ice and heat are applied as instructed.  Call if:  You experience numbness and weakness that gets worse with time, as opposed to wearing off.  New onset bowel or bladder incontinence. (This applies to Spinal procedures only)  Emergency Numbers:  Durning business hours (Monday - Thursday, 8:00 AM - 4:00 PM) (Friday, 9:00 AM - 12:00 Noon): [phone]  After hours: [phone] ____________________________________________________________________________________________    Reporting the results back to your physician The  Pain Score  Pain is a subjective complaint. It cannot be seen, touched, or measured. We depend entirely on the patient's report of the pain in order to assess your condition and treatment. To evaluate the pain, we use a pain scale, where "0" means "No Pain", and a "10" is "the worst possible pain that you can even imagine" (i.e. something like been eaten alive by a shark or being torn apart by a lion).   Use the Pain Scale provided. You will frequently be asked to rate your pain. Please be accurate, remember that medical decisions will be based on your responses. Please do not rate your pain above a 10. Doing so is actually interpreted as "symptom magnification" (exaggeration). To put this into perspective, when you tell us that your pain is at a 10 (ten), what you are saying is that there is nothing we can do to make this pain any worse. (Carefully think about that.) ____________________________________________________________________________________________

## 2018-05-18 NOTE — Progress Notes (Signed)
Patient's Name: [name]  MRN: [id number]  Referring Provider: Gillis Santa, MD  DOB: 1956-07-23  PCP: [name], NP  DOS: 05/18/2018  Note by: Gillis Santa, MD  Service setting: Ambulatory outpatient  Specialty: Interventional Pain Management  Patient type: Established  Location: [facility] (AMB) Pain Management Facility  Visit type: Interventional Procedure   Primary Reason for Visit: Interventional Pain Management Treatment. CC: Back Pain (lower) and Knee Pain  Procedure:          Anesthesia, Analgesia, Anxiolysis:  Type: Diagnostic Sacroiliac Joint Steroid Injection #1  Region: Inferior Lumbosacral Region Level: PIIS (Posterior Inferior Iliac Spine) Laterality: Bilateral  Type: Moderate (Conscious) Sedation combined with Local Anesthesia Indication(s): Analgesia and Anxiety Route: Intravenous (IV) IV Access: Secured Sedation: Meaningful verbal contact was maintained at all times during the procedure  Local Anesthetic: Lidocaine 1%   Indications: 1. Sacroiliac joint dysfunction    Pain Score: Pre-procedure: 9 /10 Post-procedure: 0-No pain/10  Pre-op Assessment:  [name] is a 62 y.o. (year old), female patient, seen today for interventional treatment. She  has a past surgical history that includes Appendectomy; Tonsillectomy; Abdominal hysterectomy; bone fusion in neck; Achilles tendon repair; and Hernia repair. [name] has a current medication list which includes the following prescription(s): amitriptyline, aspirin, atorvastatin, bupropion, buspirone, calcium citrate-vitamin d, carisoprodol, duloxetine, furosemide, gabapentin, hydroxyzine, ibuprofen, multivitamin with minerals, omeprazole, potassium chloride sa, proair hfa, telmisartan-hydrochlorothiazide, tiotropium, baclofen, and ondansetron, and the following Facility-Administered Medications: bupivacaine (pf), bupivacaine (pf), bupivacaine (pf), bupivacaine (pf), cefazolin, cefazolin, cefazolin, fentanyl, fentanyl,  fentanyl, lactated ringers, lactated ringers, lactated ringers, lactated ringers, lactated ringers, lidocaine (pf), lidocaine (pf), lidocaine, midazolam, midazolam, orphenadrine, orphenadrine, orphenadrine, sodium chloride flush, sodium chloride flush, triamcinolone acetonide, triamcinolone acetonide, and triamcinolone acetonide. Her primarily concern today is the Back Pain (lower) and Knee Pain  Initial Vital Signs:  Pulse/HCG Rate: 80ECG Heart Rate: 82 Temp: 98.1 F (36.7 C) Resp: 18 BP: (!) 146/65 SpO2: 99 %  BMI: Estimated body mass index is 46.41 kg/m as calculated from the following:   Height as of this encounter: 5\' 3"  (1.6 m).   Weight as of this encounter: 262 lb (118.8 kg).  Risk Assessment: Allergies: Reviewed. She has No Known Allergies.  Allergy Precautions: None required Coagulopathies: Reviewed. None identified.  Blood-thinner therapy: None at this time Active Infection(s): Reviewed. None identified. [name] is afebrile  Site Confirmation: [name] was asked to confirm the procedure and laterality before marking the site Procedure checklist: Completed Consent: Before the procedure and under the influence of no sedative(s), amnesic(s), or anxiolytics, the patient was informed of the treatment options, risks and possible complications. To fulfill our ethical and legal obligations, as recommended by the American Medical Association's Code of Ethics, I have informed the patient of my clinical impression; the nature and purpose of the treatment or procedure; the risks, benefits, and possible complications of the intervention; the alternatives, including doing nothing; the risk(s) and benefit(s) of the alternative treatment(s) or procedure(s); and the risk(s) and benefit(s) of doing nothing. The patient was provided information about the general risks and possible complications associated with the procedure. These may include, but are not limited to: failure to achieve  desired goals, infection, bleeding, organ or nerve damage, allergic reactions, paralysis, and death. In addition, the patient was informed of those risks and complications associated to the procedure, such as failure to decrease pain; infection; bleeding; organ or nerve damage with subsequent damage to sensory, motor, and/or autonomic systems, resulting in permanent pain, numbness,  and/or weakness of one or several areas of the body; allergic reactions; (i.e.: anaphylactic reaction); and/or death. Furthermore, the patient was informed of those risks and complications associated with the medications. These include, but are not limited to: allergic reactions (i.e.: anaphylactic or anaphylactoid reaction(s)); adrenal axis suppression; blood sugar elevation that in diabetics may result in ketoacidosis or comma; water retention that in patients with history of congestive heart failure may result in shortness of breath, pulmonary edema, and decompensation with resultant heart failure; weight gain; swelling or edema; medication-induced neural toxicity; particulate matter embolism and blood vessel occlusion with resultant organ, and/or nervous system infarction; and/or aseptic necrosis of one or more joints. Finally, the patient was informed that Medicine is not an exact science; therefore, there is also the possibility of unforeseen or unpredictable risks and/or possible complications that may result in a catastrophic outcome. The patient indicated having understood very clearly. We have given the patient no guarantees and we have made no promises. Enough time was given to the patient to ask questions, all of which were answered to the patient's satisfaction. [name] has indicated that she wanted to continue with the procedure. Attestation: I, the ordering provider, attest that I have discussed with the patient the benefits, risks, side-effects, alternatives, likelihood of achieving goals, and potential problems  during recovery for the procedure that I have provided informed consent. Date  Time: 05/18/2018 10:07 AM  Pre-Procedure Preparation:  Monitoring: As per clinic protocol. Respiration, ETCO2, SpO2, BP, heart rate and rhythm monitor placed and checked for adequate function Safety Precautions: Patient was assessed for positional comfort and pressure points before starting the procedure. Time-out: I initiated and conducted the "Time-out" before starting the procedure, as per protocol. The patient was asked to participate by confirming the accuracy of the "Time Out" information. Verification of the correct person, site, and procedure were performed and confirmed by me, the nursing staff, and the patient. "Time-out" conducted as per Joint Commission's Universal Protocol (UP.01.01.01). Time: 1111  Description of Procedure:          Position: Prone Target Area: Inferior, posterior, aspect of the sacroiliac fissure Approach: Posterior, paraspinal, ipsilateral approach. Area Prepped: Entire Lower Lumbosacral Region Prepping solution: ChloraPrep (2% chlorhexidine gluconate and 70% isopropyl alcohol) Safety Precautions: Aspiration looking for blood return was conducted prior to all injections. At no point did we inject any substances, as a needle was being advanced. No attempts were made at seeking any paresthesias. Safe injection practices and needle disposal techniques used. Medications properly checked for expiration dates. SDV (single dose vial) medications used. Description of the Procedure: Protocol guidelines were followed. The patient was placed in position over the procedure table. The target area was identified and the area prepped in the usual manner. Skin & deeper tissues infiltrated with local anesthetic. Appropriate amount of time allowed to pass for local anesthetics to take effect. The procedure needle was advanced under fluoroscopic guidance into the sacroiliac joint until a firm endpoint was  obtained. Proper needle placement secured. Negative aspiration confirmed. Solution injected in intermittent fashion, asking for systemic symptoms every 0.5cc of injectate. The needles were then removed and the area cleansed, making sure to leave some of the prepping solution back to take advantage of its long term bactericidal properties. Vitals:   05/18/18 1117 05/18/18 1127 05/18/18 1137 05/18/18 1147  BP: (!) 147/70 (!) 163/66 136/60 (!) 144/66  Pulse:  74 73 79  Resp: 16 (!) 21 20 19   Temp:  97.6 F (36.4 C)  98.2 F (36.8 C)  TempSrc:  Temporal  Temporal  SpO2: 95% 95% 94% 94%  Weight:      Height:        Start Time: 1112 hrs. End Time: 1117 hrs. Materials:  Needle(s) Type: Regular needle Gauge: 22G Length: 3.5-in Medication(s): Please see orders for medications and dosing details. 10 cc solution made of 9 cc of 0.2% ropivacaine, 1 cc of Decadron 10 mg/cc.  2.5 cc injected intra-articular, 2.5 cc injected periarticular bilaterally. Imaging Guidance (Non-Spinal):          Type of Imaging Technique: Fluoroscopy Guidance (Non-Spinal) Indication(s): Assistance in needle guidance and placement for procedures requiring needle placement in or near specific anatomical locations not easily accessible without such assistance. Exposure Time: Please see nurses notes. Contrast: Before injecting any contrast, we confirmed that the patient did not have an allergy to iodine, shellfish, or radiological contrast. Once satisfactory needle placement was completed at the desired level, radiological contrast was injected. Contrast injected under live fluoroscopy. No contrast complications. See chart for type and volume of contrast used. Fluoroscopic Guidance: I was personally present during the use of fluoroscopy. "Tunnel Vision Technique" used to obtain the best possible view of the target area. Parallax error corrected before commencing the procedure. "Direction-depth-direction" technique used to  introduce the needle under continuous pulsed fluoroscopy. Once target was reached, antero-posterior, oblique, and lateral fluoroscopic projection used confirm needle placement in all planes. Images permanently stored in EMR. Interpretation: I personally interpreted the imaging intraoperatively. Adequate needle placement confirmed in multiple planes. Appropriate spread of contrast into desired area was observed. No evidence of afferent or efferent intravascular uptake. Permanent images saved into the patient's record.  Antibiotic Prophylaxis:   Anti-infectives (From admission, onward)   None     Indication(s): None identified  Post-operative Assessment:  Post-procedure Vital Signs:  Pulse/HCG Rate: 7983 Temp: 98.2 F (36.8 C) Resp: 19 BP: (!) 144/66 SpO2: 94 %  EBL: None  Complications: No immediate post-treatment complications observed by team, or reported by patient.  Note: The patient tolerated the entire procedure well. A repeat set of vitals were taken after the procedure and the patient was kept under observation following institutional policy, for this type of procedure. Post-procedural neurological assessment was performed, showing return to baseline, prior to discharge. The patient was provided with post-procedure discharge instructions, including a section on how to identify potential problems. Should any problems arise concerning this procedure, the patient was given instructions to immediately contact us, at any time, without hesitation. In any case, we plan to contact the patient by telephone for a follow-up status report regarding this interventional procedure.  Comments:  No additional relevant information. 5 out of 5 strength bilateral lower extremity: Plantar flexion, dorsiflexion, knee flexion, knee extension.  Plan of Care   Imaging Orders     DG C-Arm 1-60 Min-No Report Procedure Orders    No procedure(s) ordered today    Medications ordered for  procedure: Meds ordered this encounter  Medications  . lactated ringers infusion 1,000 mL  . fentaNYL (SUBLIMAZE) injection 25-100 mcg    Make sure Narcan is available in the pyxis when using this medication. In the event of respiratory depression (RR< 8/min): Titrate NARCAN (naloxone) in increments of 0.1 to 0.2 mg IV at 2-3 minute intervals, until desired degree of reversal.  . lidocaine (XYLOCAINE) 1 % (with pres) injection 10 mL  . ropivacaine (PF) 2 mg/mL (0.2%) (NAROPIN) injection 10 mL  . dexamethasone (DECADRON) injection 10 mg  Medications administered: We administered lactated ringers, fentaNYL, ropivacaine (PF) 2 mg/mL (0.2%), and dexamethasone.  See the medical record for exact dosing, route, and time of administration.  New Prescriptions   No medications on file   Disposition: Discharge home  Discharge Date & Time: 05/18/2018; 1150 hrs.   Physician-requested Follow-up: Return in about 7 weeks (around 07/04/2018) for Post Procedure Evaluation.  Future Appointments  Date Time Provider [place]  06/28/2018  1:30 PM [name], NP [facility]  07/05/2018 11:30 AM Gillis Santa, MD [facility] None   Primary Care Physician: [name], NP Location: [facility] Note by: Gillis Santa, MD Date: 05/18/2018; Time: 12:06 PM  Disclaimer:  Medicine is not an exact science. The only guarantee in medicine is that nothing is guaranteed. It is important to note that the decision to proceed with this intervention was based on the information collected from the patient. The Data and conclusions were drawn from the patient's questionnaire, the interview, and the physical examination. Because the information was provided in large part by the patient, it cannot be guaranteed that it has not been purposely or unconsciously manipulated. Every effort has been made to obtain as much relevant data as possible for this evaluation. It is important to note that  the conclusions that lead to this procedure are derived in large part from the available data. Always take into account that the treatment will also be dependent on availability of resources and existing treatment guidelines, considered by other Pain Management Practitioners as being common knowledge and practice, at the time of the intervention. For Medico-Legal purposes, it is also important to point out that variation in procedural techniques and pharmacological choices are the acceptable norm. The indications, contraindications, technique, and results of the above procedure should only be interpreted and judged by a Board-Certified Interventional Pain Specialist with extensive familiarity and expertise in the same exact procedure and technique.

## 2018-05-18 NOTE — Progress Notes (Signed)
Safety precautions to be maintained throughout the outpatient stay will include: orient to surroundings, keep bed in low position, maintain call bell within reach at all times, provide assistance with transfer out of bed and ambulation.  

## 2018-05-19 ENCOUNTER — Telehealth: Payer: Self-pay

## 2018-05-19 NOTE — Telephone Encounter (Signed)
Post procedure phone call.  Unable to leave message.

## 2018-06-14 ENCOUNTER — Other Ambulatory Visit: Payer: Self-pay | Admitting: Unknown Physician Specialty

## 2018-06-15 NOTE — Telephone Encounter (Signed)
hydroxyzine refill Last Refill:03/28/18 # 90 1 RF Last OV: 03/28/18 PCP: [name] NP [place]

## 2018-06-22 ENCOUNTER — Other Ambulatory Visit: Payer: Self-pay | Admitting: Unknown Physician Specialty

## 2018-06-24 ENCOUNTER — Other Ambulatory Visit: Payer: Self-pay

## 2018-06-24 MED ORDER — TELMISARTAN-HCTZ 40-12.5 MG PO TABS: 1.0000 | ORAL_TABLET | Freq: Every day | ORAL | 6 refills | Status: DC

## 2018-06-24 NOTE — Telephone Encounter (Signed)
Gabapentin 800 mg refill request  Has appt with [name] on 06/28/18 at 1:30.   Refill during OV in case of changes?  [phone] - [place], [place]

## 2018-06-28 ENCOUNTER — Ambulatory Visit (INDEPENDENT_AMBULATORY_CARE_PROVIDER_SITE_OTHER): Payer: BLUE CROSS/BLUE SHIELD | Admitting: Unknown Physician Specialty

## 2018-06-28 ENCOUNTER — Encounter: Payer: Self-pay | Admitting: Unknown Physician Specialty

## 2018-06-28 VITALS — BP 140/73 | HR 94 | Temp 99.5°F | Ht 63.0 in | Wt 268.0 lb

## 2018-06-28 DIAGNOSIS — F324 Major depressive disorder, single episode, in partial remission: Secondary | ICD-10-CM

## 2018-06-28 DIAGNOSIS — G4730 Sleep apnea, unspecified: Secondary | ICD-10-CM

## 2018-06-28 DIAGNOSIS — F419 Anxiety disorder, unspecified: Secondary | ICD-10-CM

## 2018-06-28 DIAGNOSIS — R635 Abnormal weight gain: Secondary | ICD-10-CM | POA: Insufficient documentation

## 2018-06-28 DIAGNOSIS — I10 Essential (primary) hypertension: Secondary | ICD-10-CM | POA: Diagnosis not present

## 2018-06-28 DIAGNOSIS — D72829 Elevated white blood cell count, unspecified: Secondary | ICD-10-CM | POA: Diagnosis not present

## 2018-06-28 DIAGNOSIS — Z720 Tobacco use: Secondary | ICD-10-CM | POA: Diagnosis not present

## 2018-06-28 DIAGNOSIS — R7301 Impaired fasting glucose: Secondary | ICD-10-CM

## 2018-06-28 DIAGNOSIS — R252 Cramp and spasm: Secondary | ICD-10-CM

## 2018-06-28 LAB — BAYER DCA HB A1C WAIVED: HB A1C (BAYER DCA - WAIVED): 5.7 % (ref <7.0)

## 2018-06-28 MED ORDER — BUPROPION HCL ER (XL) 300 MG PO TB24: 300.0000 mg | ORAL_TABLET | Freq: Every day | ORAL | 1 refills | Status: DC

## 2018-06-28 MED ORDER — TIOTROPIUM BROMIDE MONOHYDRATE 18 MCG IN CAPS: 18.0000 ug | ORAL_CAPSULE | Freq: Every day | RESPIRATORY_TRACT | 3 refills | Status: DC

## 2018-06-28 MED ORDER — BUSPIRONE HCL 10 MG PO TABS: 10.0000 mg | ORAL_TABLET | Freq: Three times a day (TID) | ORAL | 1 refills | Status: DC

## 2018-06-28 MED ORDER — METFORMIN HCL 500 MG PO TABS: 500.0000 mg | ORAL_TABLET | Freq: Three times a day (TID) | ORAL | 3 refills | Status: DC

## 2018-06-28 MED ORDER — ALBUTEROL SULFATE HFA 108 (90 BASE) MCG/ACT IN AERS: 2.0000 | INHALATION_SPRAY | Freq: Four times a day (QID) | RESPIRATORY_TRACT | 0 refills | Status: DC | PRN

## 2018-06-28 MED ORDER — CARISOPRODOL 350 MG PO TABS: ORAL_TABLET | ORAL | 0 refills | Status: DC

## 2018-06-28 NOTE — Assessment & Plan Note (Signed)
I have recommended absolute tobacco cessation. I have discussed various options available for assistance with tobacco cessation including over the counter methods (Nicotine gum, patch and lozenges). We also discussed prescription options (Chantix, Nicotine Inhaler / Nasal Spray). The patient is not interested in pursuing any prescription tobacco cessation options at this time. Getting low dose CT scans

## 2018-06-28 NOTE — Assessment & Plan Note (Signed)
Refill Soma which she has taken for years

## 2018-06-28 NOTE — Assessment & Plan Note (Addendum)
Gained 6 pounds in 1 month despite 1,000 calories/day.  She would like to try Metformin as it helped her sister.  Pt aware off-label use.  Written for TID with meals.  Pt instructed to start with 1/day and increase by an extra pill every 2 weeks

## 2018-06-28 NOTE — Assessment & Plan Note (Signed)
Stable, continue present medications.   

## 2018-06-28 NOTE — Assessment & Plan Note (Signed)
Wearing CPAP QHS

## 2018-06-28 NOTE — Assessment & Plan Note (Signed)
Not to goal.  Increase Buspar to TID

## 2018-06-28 NOTE — Progress Notes (Signed)
BP 140/73   Pulse 94   Temp 99.5 F (37.5 C) (Oral)   Ht 5\' 3"  (1.6 m)   Wt 268 lb (121.6 kg)   LMP  (LMP Unknown)   SpO2 97%   BMI 47.47 kg/m    Subjective:    Patient ID: [name], female    DOB: 15-Aug-1956, 62 y.o.   MRN: [id number]  HPI: [name] is a 62 y.o. female  Chief Complaint  Patient presents with  . Depression  . Diabetes  . Hypertension   Depression Taking Cymbalta 60 mg daily and Buspar 10 mg BID.  Had a bad panic attack last night.  In general, doing well.   Depression screen [facility] 2/9 06/28/2018 05/18/2018 05/11/2018 02/17/2018 01/19/2018  Decreased Interest 1 0 0 0 0  Down, Depressed, Hopeless 1 0 0 0 0  PHQ - 2 Score 2 0 0 0 0  Altered sleeping 0 - - - 0  Tired, decreased energy 0 - - - 0  Change in appetite 0 - - - 0  Feeling bad or failure about yourself  0 - - - 0  Trouble concentrating 1 - - - 0  Moving slowly or fidgety/restless 0 - - - 0  Suicidal thoughts 0 - - - 0  PHQ-9 Score 3 - - - 0  Some recent data might be hidden   Hypertension Using medications without difficulty Average home BPs: "doing good  No problems or lightheadedness No chest pain with exertion or shortness of breath No Edema   Hyperlipidemia Using medications without problems No Muscle aches  Diet compliance:Exercise: Unable to lose weight even on 1,000 calories/day.  Needs to lose weight for surgery.  Wonders if Metformin would help   Relevant past medical, surgical, family and social history reviewed and updated as indicated. Interim medical history since our last visit reviewed. Allergies and medications reviewed and updated.  Review of Systems  Per HPI unless specifically indicated above     Objective:    BP 140/73   Pulse 94   Temp 99.5 F (37.5 C) (Oral)   Ht 5\' 3"  (1.6 m)   Wt 268 lb (121.6 kg)   LMP  (LMP Unknown)   SpO2 97%   BMI 47.47 kg/m   Wt Readings from Last 3 Encounters:  06/28/18 268 lb (121.6 kg)  05/18/18 262 lb (118.8  kg)  05/11/18 262 lb (118.8 kg)    Physical Exam  Constitutional: She is oriented to person, place, and time. She appears well-developed and well-nourished. No distress.  HENT:  Head: Normocephalic and atraumatic.  Eyes: Conjunctivae and lids are normal. Right eye exhibits no discharge. Left eye exhibits no discharge. No scleral icterus.  Neck: Normal range of motion. Neck supple. No JVD present. Carotid bruit is not present.  Cardiovascular: Normal rate, regular rhythm and normal heart sounds.  Pulmonary/Chest: Effort normal and breath sounds normal.  Abdominal: Normal appearance. There is no splenomegaly or hepatomegaly.  Musculoskeletal: Normal range of motion.  Neurological: She is alert and oriented to person, place, and time.  Skin: Skin is warm, dry and intact. No rash noted. No pallor.  Psychiatric: She has a normal mood and affect. Her behavior is normal. Judgment and thought content normal.    Results for orders placed or performed in visit on 01/05/18  Compliance Drug Analysis, Ur  Result Value Ref Range   Summary FINAL       Assessment & Plan:   Problem List  Items Addressed This Visit      Unprioritized   Abnormal weight gain    Gained 6 pounds in 1 month despite 1,000 calories/day.  She would like to try Metformin as it helped her sister.  Pt aware off-label use.  Written for TID with meals.  Pt instructed to start with 1/day and increase by an extra pill every 2 weeks      Relevant Orders   TSH   Anxiety    Not to goal.  Increase Buspar to TID      Relevant Medications   buPROPion (WELLBUTRIN XL) 300 MG 24 hr tablet   busPIRone (BUSPAR) 10 MG tablet   Benign hypertension    Stable, continue present medications.        Relevant Orders   Comprehensive metabolic panel   Lipid Panel w/o Chol/HDL Ratio   Depression, major, single episode, in partial remission (HCC)    Stable, continue present medications.        Relevant Medications   buPROPion  (WELLBUTRIN XL) 300 MG 24 hr tablet   busPIRone (BUSPAR) 10 MG tablet   Sleep apnea    Wearing CPAP QHS      Spasm    Refill Soma which she has taken for years      Tobacco abuse     I have recommended absolute tobacco cessation. I have discussed various options available for assistance with tobacco cessation including over the counter methods (Nicotine gum, patch and lozenges). We also discussed prescription options (Chantix, Nicotine Inhaler / Nasal Spray). The patient is not interested in pursuing any prescription tobacco cessation options at this time. Getting low dose CT scans        Other Visit Diagnoses    Leukocytosis, unspecified type    -  Primary   Relevant Orders   CBC with Differential/Platelet   Impaired fasting glucose       Relevant Orders   Bayer DCA Hb A1c Waived       Follow up plan: Return in about 3 months (around 09/28/2018).

## 2018-06-29 ENCOUNTER — Telehealth: Payer: Self-pay | Admitting: Unknown Physician Specialty

## 2018-06-29 DIAGNOSIS — N183 Chronic kidney disease, stage 3 unspecified: Secondary | ICD-10-CM

## 2018-06-29 LAB — CBC WITH DIFFERENTIAL/PLATELET
Basophils Absolute: 0.1 10*3/uL (ref 0.0–0.2)
Basos: 1 %
EOS (ABSOLUTE): 0.2 10*3/uL (ref 0.0–0.4)
Eos: 2 %
Hematocrit: 39.1 % (ref 34.0–46.6)
Hemoglobin: 12.8 g/dL (ref 11.1–15.9)
Immature Grans (Abs): 0 10*3/uL (ref 0.0–0.1)
Immature Granulocytes: 0 %
Lymphocytes Absolute: 3.8 10*3/uL — ABNORMAL HIGH (ref 0.7–3.1)
Lymphs: 33 %
MCH: 29.7 pg (ref 26.6–33.0)
MCHC: 32.7 g/dL (ref 31.5–35.7)
MCV: 91 fL (ref 79–97)
Monocytes Absolute: 1.1 10*3/uL — ABNORMAL HIGH (ref 0.1–0.9)
Monocytes: 10 %
Neutrophils Absolute: 6.4 10*3/uL (ref 1.4–7.0)
Neutrophils: 54 %
Platelets: 229 10*3/uL (ref 150–450)
RBC: 4.31 x10E6/uL (ref 3.77–5.28)
RDW: 13 % (ref 12.3–15.4)
WBC: 11.6 10*3/uL — ABNORMAL HIGH (ref 3.4–10.8)

## 2018-06-29 LAB — COMPREHENSIVE METABOLIC PANEL
ALT: 21 IU/L (ref 0–32)
AST: 24 IU/L (ref 0–40)
Albumin/Globulin Ratio: 1.6 (ref 1.2–2.2)
Albumin: 3.9 g/dL (ref 3.6–4.8)
Alkaline Phosphatase: 120 IU/L — ABNORMAL HIGH (ref 39–117)
BUN/Creatinine Ratio: 12 (ref 12–28)
BUN: 15 mg/dL (ref 8–27)
Bilirubin Total: 0.2 mg/dL (ref 0.0–1.2)
CO2: 18 mmol/L — ABNORMAL LOW (ref 20–29)
Calcium: 9.4 mg/dL (ref 8.7–10.3)
Chloride: 103 mmol/L (ref 96–106)
Creatinine, Ser: 1.23 mg/dL — ABNORMAL HIGH (ref 0.57–1.00)
GFR calc Af Amer: 54 mL/min/{1.73_m2} — ABNORMAL LOW (ref >59)
GFR calc non Af Amer: 47 mL/min/{1.73_m2} — ABNORMAL LOW (ref >59)
Globulin, Total: 2.5 g/dL (ref 1.5–4.5)
Glucose: 102 mg/dL — ABNORMAL HIGH (ref 65–99)
Potassium: 4.7 mmol/L (ref 3.5–5.2)
Sodium: 139 mmol/L (ref 134–144)
Total Protein: 6.4 g/dL (ref 6.0–8.5)

## 2018-06-29 LAB — LIPID PANEL W/O CHOL/HDL RATIO
Cholesterol, Total: 176 mg/dL (ref 100–199)
HDL: 47 mg/dL (ref >39)
LDL Calculated: 99 mg/dL (ref 0–99)
Triglycerides: 151 mg/dL — ABNORMAL HIGH (ref 0–149)
VLDL Cholesterol Cal: 30 mg/dL (ref 5–40)

## 2018-06-29 LAB — TSH: TSH: 2.06 u[IU]/mL (ref 0.450–4.500)

## 2018-06-29 NOTE — Telephone Encounter (Signed)
appt scheduled with [name] 8/29

## 2018-06-29 NOTE — Telephone Encounter (Signed)
No answer.  Will leave message on mychart.

## 2018-06-29 NOTE — Telephone Encounter (Signed)
Unable to reach pt by phone.  Can you set up an appointment for her in 1 month to recheck kidney functions?  I would like her to have a visit rather than just labw

## 2018-07-05 ENCOUNTER — Ambulatory Visit: Payer: BLUE CROSS/BLUE SHIELD | Admitting: Student in an Organized Health Care Education/Training Program

## 2018-07-28 ENCOUNTER — Ambulatory Visit (INDEPENDENT_AMBULATORY_CARE_PROVIDER_SITE_OTHER): Payer: BLUE CROSS/BLUE SHIELD | Admitting: Physician Assistant

## 2018-07-28 ENCOUNTER — Encounter: Payer: Self-pay | Admitting: Physician Assistant

## 2018-07-28 VITALS — BP 133/78 | HR 96 | Temp 98.6°F | Ht 63.0 in | Wt 262.4 lb

## 2018-07-28 DIAGNOSIS — N183 Chronic kidney disease, stage 3 unspecified: Secondary | ICD-10-CM

## 2018-07-28 DIAGNOSIS — J449 Chronic obstructive pulmonary disease, unspecified: Secondary | ICD-10-CM

## 2018-07-28 MED ORDER — UMECLIDINIUM BROMIDE 62.5 MCG/INH IN AEPB: 1.0000 | INHALATION_SPRAY | Freq: Every day | RESPIRATORY_TRACT | 0 refills | Status: DC

## 2018-07-28 NOTE — Progress Notes (Signed)
Subjective:    Patient ID: [name], female    DOB: Jan 03, 1956, 62 y.o.   MRN: [id number]  Kathleen FRID is a 62 y.o. female presenting on 07/28/2018 for Follow-up (kidney function f/up) and Medication Problem (pt states her insurance is no longer covering spiriva, preferred medication is Incruse Ellipta)   HPI   She is following up for decreased kidney function. Patient was "eating ibuprofen", patient 800 mg every 4 hours. Has since switched to Tylenol arthritits. At last visit, she was also started on metformin for weight loss and there was a question of whether or not she could continue this due to reduced kidney function.   CMP Latest Ref Rng & Units 07/28/2018 06/28/2018 12/27/2017  Glucose 65 - 99 mg/dL 91 102(H) 90  BUN 8 - 27 mg/dL 15 15 13   Creatinine 0.57 - 1.00 mg/dL 0.97 1.23(H) 1.08(H)  Sodium 134 - 144 mmol/L 139 139 141  Potassium 3.5 - 5.2 mmol/L 4.7 4.7 4.5  Chloride 96 - 106 mmol/L 99 103 103  CO2 20 - 29 mmol/L 22 18(L) 21  Calcium 8.7 - 10.3 mg/dL 9.4 9.4 9.3  Total Protein 6.0 - 8.5 g/dL 6.9 6.4 6.6  Total Bilirubin 0.0 - 1.2 mg/dL 0.2 0.2 0.2  Alkaline Phos 39 - 117 IU/L 107 120(H) 109  AST 0 - 40 IU/L 19 24 20   ALT 0 - 32 IU/L 14 21 19      Wt Readings from Last 3 Encounters:  07/28/18 262 lb 6.4 oz (119 kg)  06/28/18 268 lb (121.6 kg)  05/18/18 262 lb (118.8 kg)   COPD: Reports her insurance company has stopped covering spiriva and prefers Incruse Ellipta. She has a 46 pack year smoking history. Has dyspnea on exertion, wheezing that is controlled with daily Spiriva.   Social History   Tobacco Use  . Smoking status: Current Every Day Smoker    Packs/day: 1.00    Years: 46.00    Pack years: 46.00    Types: Cigarettes  . Smokeless tobacco: Never Used  . Tobacco comment: pt states she plans to begin using patch to quit smoking  Substance Use Topics  . Alcohol use: No    Alcohol/week: 0.0 standard drinks  . Drug use: No    Review of  Systems Per HPI unless specifically indicated above     Objective:    BP 133/78   Pulse 96   Temp 98.6 F (37 C) (Oral)   Ht 5\' 3"  (1.6 m)   Wt 262 lb 6.4 oz (119 kg)   LMP  (LMP Unknown)   SpO2 96%   BMI 46.48 kg/m   Wt Readings from Last 3 Encounters:  07/28/18 262 lb 6.4 oz (119 kg)  06/28/18 268 lb (121.6 kg)  05/18/18 262 lb (118.8 kg)    Physical Exam  Constitutional: She is oriented to person, place, and time. She appears well-developed and well-nourished.  Cardiovascular: Normal rate and regular rhythm.  Pulmonary/Chest: Effort normal and breath sounds normal.  Neurological: She is alert and oriented to person, place, and time.  Skin: Skin is warm and dry.  Psychiatric: She has a normal mood and affect. Her behavior is normal.   Results for orders placed or performed in visit on 07/28/18  Comprehensive metabolic panel  Result Value Ref Range   Glucose 91 65 - 99 mg/dL   BUN 15 8 - 27 mg/dL   Creatinine, Ser 0.97 0.57 - 1.00 mg/dL   GFR calc  non Af Amer 63 >59 mL/min/1.73   GFR calc Af Amer 72 >59 mL/min/1.73   BUN/Creatinine Ratio 15 12 - 28   Sodium 139 134 - 144 mmol/L   Potassium 4.7 3.5 - 5.2 mmol/L   Chloride 99 96 - 106 mmol/L   CO2 22 20 - 29 mmol/L   Calcium 9.4 8.7 - 10.3 mg/dL   Total Protein 6.9 6.0 - 8.5 g/dL   Albumin 4.1 3.6 - 4.8 g/dL   Globulin, Total 2.8 1.5 - 4.5 g/dL   Albumin/Globulin Ratio 1.5 1.2 - 2.2   Bilirubin Total 0.2 0.0 - 1.2 mg/dL   Alkaline Phosphatase 107 39 - 117 IU/L   AST 19 0 - 40 IU/L   ALT 14 0 - 32 IU/L      Assessment & Plan:  1. Stage 3 chronic kidney disease (HCC)  - Comprehensive metabolic panel  2. COPD, severe ([place])  - umeclidinium bromide (INCRUSE ELLIPTA) 62.5 MCG/INH AEPB; Inhale 1 puff into the lungs daily.  Dispense: 90 each; Refill: 0    Follow up plan: Return if symptoms worsen or fail to improve.   Carles Collet, PA-C [facility] 07/29/2018, 9:40  AM

## 2018-07-29 LAB — COMPREHENSIVE METABOLIC PANEL
ALT: 14 IU/L (ref 0–32)
AST: 19 IU/L (ref 0–40)
Albumin/Globulin Ratio: 1.5 (ref 1.2–2.2)
Albumin: 4.1 g/dL (ref 3.6–4.8)
Alkaline Phosphatase: 107 IU/L (ref 39–117)
BUN/Creatinine Ratio: 15 (ref 12–28)
BUN: 15 mg/dL (ref 8–27)
Bilirubin Total: 0.2 mg/dL (ref 0.0–1.2)
CO2: 22 mmol/L (ref 20–29)
Calcium: 9.4 mg/dL (ref 8.7–10.3)
Chloride: 99 mmol/L (ref 96–106)
Creatinine, Ser: 0.97 mg/dL (ref 0.57–1.00)
GFR calc Af Amer: 72 mL/min/{1.73_m2} (ref >59)
GFR calc non Af Amer: 63 mL/min/{1.73_m2} (ref >59)
Globulin, Total: 2.8 g/dL (ref 1.5–4.5)
Glucose: 91 mg/dL (ref 65–99)
Potassium: 4.7 mmol/L (ref 3.5–5.2)
Sodium: 139 mmol/L (ref 134–144)
Total Protein: 6.9 g/dL (ref 6.0–8.5)

## 2018-07-29 NOTE — Patient Instructions (Signed)

## 2018-08-14 ENCOUNTER — Other Ambulatory Visit: Payer: Self-pay | Admitting: Unknown Physician Specialty

## 2018-08-15 NOTE — Telephone Encounter (Signed)
Soma refill Last refill:06/28/18 #90/0 refill  Hydroxyzine refill Last refill"06/15/18 #90/1 refill Last OV:06/28/18 ZES:[id number] Pharmacy: [facility] [address], [place] - [place] [phone] (Phone) [phone] (Fax)

## 2018-09-01 ENCOUNTER — Encounter: Payer: Self-pay | Admitting: *Deleted

## 2018-09-01 ENCOUNTER — Telehealth: Payer: Self-pay | Admitting: *Deleted

## 2018-09-01 DIAGNOSIS — Z122 Encounter for screening for malignant neoplasm of respiratory organs: Secondary | ICD-10-CM

## 2018-09-01 NOTE — Telephone Encounter (Signed)
Attempted to contact patient r/t LDCT Screening follow up due at this time.  No answer received, message left for patient to call [phone] to schedule appointment.    

## 2018-09-01 NOTE — Telephone Encounter (Signed)
Patient has been notified that the annual lung cancer screening low dose CT scan is due currently or will be in the near future.  Confirmed that the patient is within the age range of 68-80, and asymptomatic, and currently exhibits no signs or symptoms of lung cancer.  Patient denies illness that would prevent curative treatment for lung cancer if found.  Verified smoking history, current smoker 48 pkyr history .  The shared decision making visit was completed on 09-01-16.  Patient is agreeable for the CT scan to be scheduled.  Will call patient back with date and time of appointment.

## 2018-09-02 ENCOUNTER — Telehealth: Payer: Self-pay | Admitting: *Deleted

## 2018-09-02 NOTE — Telephone Encounter (Signed)
Called pt to inform of ldct screening appt on Tuesday 09/20/2018 @ 1:30pm here @ [facility], voiced understanding, appt mailed per request

## 2018-09-08 ENCOUNTER — Other Ambulatory Visit: Payer: Self-pay | Admitting: Unknown Physician Specialty

## 2018-09-13 ENCOUNTER — Other Ambulatory Visit: Payer: Self-pay | Admitting: Family Medicine

## 2018-09-13 ENCOUNTER — Other Ambulatory Visit: Payer: Self-pay | Admitting: Unknown Physician Specialty

## 2018-09-13 MED ORDER — CARISOPRODOL 350 MG PO TABS: ORAL_TABLET | ORAL | 0 refills | Status: DC

## 2018-09-13 NOTE — Telephone Encounter (Signed)
Rachel sent.

## 2018-09-13 NOTE — Telephone Encounter (Signed)
Routing message to close.

## 2018-09-13 NOTE — Telephone Encounter (Signed)
Requested medication (s) are due for refill today:   Yes  Requested medication (s) are on the active medication list:   Yes  Future visit scheduled:   Yes   09/28/18 with Cannady   Last ordered: 08/15/18   #90  0 refills   Requested Prescriptions  Pending Prescriptions Disp Refills   carisoprodol (SOMA) 350 MG tablet [Pharmacy Med Name: CARISOPRODOL350MG    TAB] 90 tablet 0    Sig: TAKE 1 TABLET BY MOUTH 2 TO 3 TIMES DAILY IF TOLERATED.     Not Delegated - Analgesics:  Muscle Relaxants Failed - 09/13/2018  2:25 AM      Failed - This refill cannot be delegated      Passed - Valid encounter within last 6 months    Recent Outpatient Visits          1 month ago COPD, severe [facility])   [place], Adriana M, PA-C   2 months ago Leukocytosis, unspecified type   [facility] [name], NP   5 months ago Benign hypertension   [facility] [name], NP   8 months ago Annual physical exam   [facility] [name], NP   1 year ago Need for influenza vaccination   [facility] [name], NP      Future Appointments            In 2 weeks Cannady, Barbaraann Faster, NP [employer]

## 2018-09-19 ENCOUNTER — Telehealth: Payer: Self-pay | Admitting: Nurse Practitioner

## 2018-09-19 NOTE — Telephone Encounter (Signed)
09/16/18 spoke with patient re: LDCT/charges/auth.  Patient has concerns she may have a balance due to her plan being o/o network. I explained to her we have a LDCT fund that would take care of this cost. If she receives any bills for this service she is to bring them here to [facility] who will take care of them. Raquel Sarna is aware of this as well.

## 2018-09-20 ENCOUNTER — Ambulatory Visit
Admission: RE | Admit: 2018-09-20 | Discharge: 2018-09-20 | Disposition: A | Discharge status: Home / Self Care | Payer: BLUE CROSS/BLUE SHIELD | Source: Ambulatory Visit | Attending: Nurse Practitioner | Admitting: Nurse Practitioner

## 2018-09-20 DIAGNOSIS — I2510 Atherosclerotic heart disease of native coronary artery without angina pectoris: Secondary | ICD-10-CM | POA: Insufficient documentation

## 2018-09-20 DIAGNOSIS — Z122 Encounter for screening for malignant neoplasm of respiratory organs: Secondary | ICD-10-CM | POA: Insufficient documentation

## 2018-09-20 DIAGNOSIS — I700 Atherosclerosis of aorta: Secondary | ICD-10-CM | POA: Diagnosis not present

## 2018-09-20 DIAGNOSIS — J432 Centrilobular emphysema: Secondary | ICD-10-CM | POA: Insufficient documentation

## 2018-09-20 DIAGNOSIS — D3501 Benign neoplasm of right adrenal gland: Secondary | ICD-10-CM | POA: Diagnosis not present

## 2018-09-20 DIAGNOSIS — F17210 Nicotine dependence, cigarettes, uncomplicated: Secondary | ICD-10-CM | POA: Insufficient documentation

## 2018-09-20 DIAGNOSIS — J438 Other emphysema: Secondary | ICD-10-CM | POA: Diagnosis not present

## 2018-09-21 ENCOUNTER — Encounter: Payer: Self-pay | Admitting: *Deleted

## 2018-09-28 ENCOUNTER — Encounter: Payer: Self-pay | Admitting: Nurse Practitioner

## 2018-09-28 ENCOUNTER — Telehealth: Payer: Self-pay | Admitting: Nurse Practitioner

## 2018-09-28 ENCOUNTER — Other Ambulatory Visit: Payer: Self-pay

## 2018-09-28 ENCOUNTER — Ambulatory Visit (INDEPENDENT_AMBULATORY_CARE_PROVIDER_SITE_OTHER): Payer: BLUE CROSS/BLUE SHIELD | Admitting: Nurse Practitioner

## 2018-09-28 VITALS — BP 131/75 | HR 98 | Temp 98.4°F | Ht 63.0 in | Wt 257.0 lb

## 2018-09-28 DIAGNOSIS — Z23 Encounter for immunization: Secondary | ICD-10-CM | POA: Diagnosis not present

## 2018-09-28 DIAGNOSIS — R635 Abnormal weight gain: Secondary | ICD-10-CM

## 2018-09-28 DIAGNOSIS — F419 Anxiety disorder, unspecified: Secondary | ICD-10-CM | POA: Diagnosis not present

## 2018-09-28 DIAGNOSIS — I10 Essential (primary) hypertension: Secondary | ICD-10-CM

## 2018-09-28 MED ORDER — GABAPENTIN 800 MG PO TABS: 800.0000 mg | ORAL_TABLET | Freq: Three times a day (TID) | ORAL | 3 refills | Status: DC

## 2018-09-28 MED ORDER — METFORMIN HCL 500 MG PO TABS: 500.0000 mg | ORAL_TABLET | Freq: Three times a day (TID) | ORAL | 3 refills | Status: DC

## 2018-09-28 MED ORDER — CARISOPRODOL 350 MG PO TABS: ORAL_TABLET | ORAL | 3 refills | Status: DC

## 2018-09-28 NOTE — Telephone Encounter (Signed)
Patient was just seen and forgot to mention she needs the metformin refilled also and sent to [place]!

## 2018-09-28 NOTE — Progress Notes (Signed)
BP 131/75   Pulse 98   Temp 98.4 F (36.9 C) (Oral)   Ht 5\' 3"  (1.6 m)   Wt 257 lb (116.6 kg)   LMP  (LMP Unknown)   SpO2 97%   BMI 45.53 kg/m    Subjective:    Patient ID: [name], female    DOB: Jun 27, 1956, 62 y.o.   MRN: [id number]  HPI: [name] is a 62 y.o. female presents for follow-up HTN, Anxiety, and weight loss (on Metformin for weight loss and not T2DM)  Chief Complaint  Patient presents with  . Weight Loss    37m f/u  . Hypertension  . Anxiety   HYPERTENSION Hypertension status: controlled  Satisfied with current treatment? yes Duration of hypertension: chronic BP monitoring frequency:  daily BP range: 130-140/70's BP medication side effects:  no Medication compliance: excellent compliance Previous BP meds:does not recall Aspirin: yes Recurrent headaches: no Visual changes: no Palpitations: no Dyspnea: no Chest pain: no Lower extremity edema: no Dizzy/lightheaded: no   ANXIETY/STRESS Duration:stable Anxious mood: no  Excessive worrying: no Irritability: no  Sweating: no Nausea: no Palpitations:no Hyperventilation: no Panic attacks: only a couple since med increase in July, reports this has improved a lot Agoraphobia: no  Obscessions/compulsions: no Depressed mood: no Depression screen [facility] 2/9 09/28/2018 06/28/2018 05/18/2018 05/11/2018 02/17/2018  Decreased Interest 0 1 0 0 0  Down, Depressed, Hopeless 0 1 0 0 0  PHQ - 2 Score 0 2 0 0 0  Altered sleeping 0 0 - - -  Tired, decreased energy 0 0 - - -  Change in appetite 0 0 - - -  Feeling bad or failure about yourself  0 0 - - -  Trouble concentrating - 1 - - -  Moving slowly or fidgety/restless 0 0 - - -  Suicidal thoughts 0 0 - - -  PHQ-9 Score 0 3 - - -  Some recent data might be hidden   GAD 7 : Generalized Anxiety Score 09/28/2018  Nervous, Anxious, on Edge 1  Control/stop worrying 0  Worry too much - different things 0  Trouble relaxing 0  Restless 0  Easily  annoyed or irritable 0  Afraid - awful might happen 0  Total GAD 7 Score 1   Anhedonia: no Weight changes: no Insomnia: Was put on Amitriptyline by pain clinic, which she reports helps.   Hypersomnia: no Fatigue/loss of energy: no Feelings of worthlessness: no Feelings of guilt: no Impaired concentration/indecisiveness: no Suicidal ideations: no  Crying spells: no Recent Stressors/Life Changes: no   Relationship problems: no   Family stress: no     Financial stress: no    Job stress: no    Recent death/loss: no  WEIGHT LOSS: Is on Metformin for weight loss, which she started in July 2019.  Has had 12 pounds loss.  Currently takes three pills a day.   Relevant past medical, surgical, family and social history reviewed and updated as indicated. Interim medical history since our last visit reviewed. Allergies and medications reviewed and updated.  Review of Systems  Constitutional: Negative for activity change, appetite change, diaphoresis, fatigue and fever.  Respiratory: Negative for cough, chest tightness, shortness of breath and wheezing.   Cardiovascular: Negative for chest pain, palpitations and leg swelling.  Gastrointestinal: Negative for abdominal distention, abdominal pain, constipation, diarrhea, nausea and vomiting.  Endocrine: Negative for cold intolerance, heat intolerance, polydipsia, polyphagia and polyuria.  Genitourinary: Negative.   Neurological: Negative for dizziness, tremors,  syncope, weakness, light-headedness, numbness and headaches.  Psychiatric/Behavioral: Negative for behavioral problems and decreased concentration. The patient is not nervous/anxious.     Per HPI unless specifically indicated above     Objective:    BP 131/75   Pulse 98   Temp 98.4 F (36.9 C) (Oral)   Ht 5\' 3"  (1.6 m)   Wt 257 lb (116.6 kg)   LMP  (LMP Unknown)   SpO2 97%   BMI 45.53 kg/m   Wt Readings from Last 3 Encounters:  09/28/18 257 lb (116.6 kg)  09/20/18 255 lb  (115.7 kg)  07/28/18 262 lb 6.4 oz (119 kg)    Physical Exam  Constitutional: She is oriented to person, place, and time. She appears well-developed and well-nourished.  HENT:  Head: Normocephalic and atraumatic.  Right Ear: Hearing, tympanic membrane and ear canal normal.  Left Ear: Hearing, tympanic membrane and ear canal normal.  Eyes: Pupils are equal, round, and reactive to light. Conjunctivae and EOM are normal. Right eye exhibits no discharge. Left eye exhibits no discharge.  Neck: Normal range of motion. Neck supple. No JVD present. Carotid bruit is not present. No thyromegaly present.  Cardiovascular: Normal rate, regular rhythm, normal heart sounds and intact distal pulses.  Pulmonary/Chest: Effort normal and breath sounds normal.  Abdominal: Soft. Bowel sounds are normal. There is no splenomegaly or hepatomegaly.  Musculoskeletal: Normal range of motion.  Lymphadenopathy:    She has no cervical adenopathy.  Neurological: She is alert and oriented to person, place, and time. She has normal reflexes.  Reflex Scores:      Brachioradialis reflexes are 2+ on the right side and 2+ on the left side.      Patellar reflexes are 2+ on the right side and 2+ on the left side. Skin: Skin is warm and dry.  Psychiatric: She has a normal mood and affect. Her behavior is normal.    Results for orders placed or performed in visit on 07/28/18  Comprehensive metabolic panel  Result Value Ref Range   Glucose 91 65 - 99 mg/dL   BUN 15 8 - 27 mg/dL   Creatinine, Ser 0.97 0.57 - 1.00 mg/dL   GFR calc non Af Amer 63 >59 mL/min/1.73   GFR calc Af Amer 72 >59 mL/min/1.73   BUN/Creatinine Ratio 15 12 - 28   Sodium 139 134 - 144 mmol/L   Potassium 4.7 3.5 - 5.2 mmol/L   Chloride 99 96 - 106 mmol/L   CO2 22 20 - 29 mmol/L   Calcium 9.4 8.7 - 10.3 mg/dL   Total Protein 6.9 6.0 - 8.5 g/dL   Albumin 4.1 3.6 - 4.8 g/dL   Globulin, Total 2.8 1.5 - 4.5 g/dL   Albumin/Globulin Ratio 1.5 1.2 - 2.2    Bilirubin Total 0.2 0.0 - 1.2 mg/dL   Alkaline Phosphatase 107 39 - 117 IU/L   AST 19 0 - 40 IU/L   ALT 14 0 - 32 IU/L      Assessment & Plan:   Problem List Items Addressed This Visit      Cardiovascular and Mediastinum   Benign hypertension - Primary    Chronic, stable.  Continue current medication regimen.        Other   Anxiety    Buspar increased last visit and patient reports decreased anxiety.  Overall improvement.  Continue current dose.      Abnormal weight gain    Has had 12 pound weight loss since July on  Metformin three times a day.  Is aware we will be frequently checking kidney function with medication . Will check this at next visit.  Had shown improvement on visit at end of August.       Other Visit Diagnoses    Flu vaccine need       Relevant Orders   Flu Vaccine QUAD 36+ mos IM (Completed)       Controlled substance.  Checked data base and x one ten day refill noted for Soma on 09/23/18, which was to get patient to this visit.  No other recent refills noted.  Follow up plan: Return in about 3 months (around 12/29/2018) for HTN & Anxiety (needs labs).

## 2018-09-28 NOTE — Assessment & Plan Note (Signed)
Has had 12 pound weight loss since July on Metformin three times a day.  Is aware we will be frequently checking kidney function with medication . Will check this at next visit.  Had shown improvement on visit at end of August.

## 2018-09-28 NOTE — Assessment & Plan Note (Signed)
Buspar increased last visit and patient reports decreased anxiety.  Overall improvement.  Continue current dose.

## 2018-09-28 NOTE — Addendum Note (Signed)
Addended by: Marnee Guarneri T on: 09/28/2018 02:10 PM   Modules accepted: Orders

## 2018-09-28 NOTE — Telephone Encounter (Signed)
Done. Thank you.

## 2018-09-28 NOTE — Assessment & Plan Note (Signed)
Chronic, stable.  Continue current medication regimen. 

## 2018-09-28 NOTE — Patient Instructions (Signed)
Serving Sizes  A serving size is a measured amount of food or drink, such as one slice of bread, that has an associated nutrient content. Knowing the serving size of a food or drink can help you determine how much of that food you should consume.  What is the size of one serving?  The size of one healthy serving depends on the food or drink. To determine a serving size, read the food label. If the food or drink does not have a food label, try to find serving size information online. Or, use the following to estimate the size of one adult serving:  Grain  1 slice bread.  bagel.  cup pasta.  Vegetable   cup cooked or canned vegetables. 1 cup raw, leafy greens.  Fruit   cup canned fruit. 1 medium fruit.  cup dried fruit.  Meat and Other Protein Sources  1 oz meat, poultry, or fish.  cup cooked beans. 1 egg.  cup nuts or seeds. 1 Tbsp nut butter.  cup tofu or tempeh. 2 Tbsp hummus.  Dairy  An individual container of yogurt (6-8 oz). 1 piece of cheese the size of your thumb (1 oz). 1 cup (8 oz) milk or milk alternative.  Fat  A piece the size of one dice. 1 tsp soft margarine. 1 Tbsp mayonnaise. 1 tsp vegetable oil. 1 Tbsp regular salad dressing. 2 Tbsp low-fat salad dressing.  How many servings should I eat from each food group each day?  The following are the suggested number of servings to try and have every day from each food group. You can also look at your eating throughout the week and aim for meeting these requirements on most days for overall healthy eating.  Grain  6-8 servings. Try to have half of your grains from whole grains, such as whole wheat bread, corn tortillas, oatmeal, brown rice, whole wheat pasta, and bulgur.  Vegetable  At least 2-3 servings.  Fruit  2 servings.  Meat and Other Protein Foods  5-6 servings. Aim to have lean proteins, such as chicken, turkey, fish, beans, or tofu.  Dairy  3 servings. Choose low-fat or nonfat if you are trying to control your weight.  Fat  2-3  servings.  Is a serving the same thing as a portion?  No. A portion is the actual amount you eat, which may be more than one serving. Knowing the specific serving size of a food and the nutritional information that goes with it can help you make a healthy decision on what size portion to eat.  What are some tips to help me learn healthy serving sizes?  Check food labels for serving sizes. Many foods that come as a single portion actually contain multiple servings.  Determine the serving size of foods you commonly eat and figure out how large a portion you usually eat.  Measure the number of servings that can be held by the bowls, glasses, cups, and plates you typically use. For example, pour your breakfast cereal into your regular bowl and then pour it into a measuring cup.  For 1-2 days, measure the serving sizes of all the foods you eat.  Practice estimating serving sizes and determining how big your portions should be.  This information is not intended to replace advice given to you by your health care provider. Make sure you discuss any questions you have with your health care provider.  Document Released: 08/15/2003 Document Revised: 07/11/2016 Document Reviewed: 02/13/2014  Elsevier Interactive Patient 

## 2018-09-30 ENCOUNTER — Other Ambulatory Visit: Payer: Self-pay | Admitting: Unknown Physician Specialty

## 2018-09-30 NOTE — Telephone Encounter (Signed)
Requested Prescriptions  Pending Prescriptions Disp Refills  . DULoxetine (CYMBALTA) 60 MG capsule [Pharmacy Med Name: DULoxetine 60MG      CAP] 90 capsule 1    Sig: TAKE 1 CAPSULE BY MOUTH ONCE DAILY     Psychiatry: Antidepressants - SNRI Passed - 09/30/2018  1:01 PM      Passed - Completed PHQ-2 or PHQ-9 in the last 360 days.      Passed - Last BP in normal range    BP Readings from Last 1 Encounters:  09/28/18 131/75         Passed - Valid encounter within last 6 months    Recent Outpatient Visits          2 days ago Benign hypertension   [place], [place] T, NP   2 months ago COPD, severe [facility])   [place], Adriana M, PA-C   3 months ago Leukocytosis, unspecified type   [place], NP   6 months ago Benign hypertension   [facility] [name], NP   9 months ago Annual physical exam   [facility] [name], NP      Future Appointments            In 3 months Cannady, Barbaraann Faster, NP [employer]

## 2018-10-11 ENCOUNTER — Other Ambulatory Visit: Payer: Self-pay | Admitting: Family Medicine

## 2018-10-11 NOTE — Telephone Encounter (Signed)
Requested Prescriptions  Pending Prescriptions Disp Refills  . hydrOXYzine (ATARAX/VISTARIL) 10 MG tablet [Pharmacy Med Name: HYDROXYZINE HCL 10MG  TAB] 90 tablet 0    Sig: TAKE 1 TABLET BY MOUTH THREE TIMES DAILY AS NEEDED     Ear, Nose, and Throat:  Antihistamines Passed - 10/11/2018  1:41 AM      Passed - Valid encounter within last 12 months    Recent Outpatient Visits          1 week ago Benign hypertension   [place], Henrine Screws T, NP   2 months ago COPD, severe [facility])   [place], Adriana M, PA-C   3 months ago Leukocytosis, unspecified type   [facility] [name], NP   6 months ago Benign hypertension   [facility] [name], NP   9 months ago Annual physical exam   [facility] [name], NP      Future Appointments            In 2 months Cannady, Barbaraann Faster, NP [employer]

## 2018-10-23 ENCOUNTER — Other Ambulatory Visit: Payer: Self-pay | Admitting: Physician Assistant

## 2018-10-23 DIAGNOSIS — J449 Chronic obstructive pulmonary disease, unspecified: Secondary | ICD-10-CM

## 2018-10-24 NOTE — Telephone Encounter (Signed)
Spoke with Susa Griffins, Pharmacist at [facility]; she states that the prescription for Incruse was received.

## 2018-11-09 ENCOUNTER — Other Ambulatory Visit: Payer: Self-pay | Admitting: Family Medicine

## 2018-11-09 NOTE — Telephone Encounter (Signed)
Requested Prescriptions  Pending Prescriptions Disp Refills  . hydrOXYzine (ATARAX/VISTARIL) 10 MG tablet [Pharmacy Med Name: HYDROXYZINE HCL 10MG  TAB] 90 tablet 0    Sig: TAKE 1 TABLET BY MOUTH THREE TIMES DAILY AS NEEDED     Ear, Nose, and Throat:  Antihistamines Passed - 11/09/2018  1:20 PM      Passed - Valid encounter within last 12 months    Recent Outpatient Visits          1 month ago Benign hypertension   [place], [place] T, NP   3 months ago COPD, severe [facility])   [place], Adriana M, PA-C   4 months ago Leukocytosis, unspecified type   [place], NP   7 months ago Benign hypertension   [facility] [name], NP   10 months ago Annual physical exam   [facility] [name], NP      Future Appointments            In 1 month Cannady, Barbaraann Faster, NP [employer]

## 2018-11-18 NOTE — Telephone Encounter (Signed)
We can not do 90 day on this as it is controlled substance.

## 2018-11-19 ENCOUNTER — Other Ambulatory Visit: Payer: Self-pay | Admitting: Unknown Physician Specialty

## 2018-11-20 ENCOUNTER — Other Ambulatory Visit: Payer: Self-pay | Admitting: Nurse Practitioner

## 2018-11-20 ENCOUNTER — Other Ambulatory Visit: Payer: Self-pay | Admitting: Family Medicine

## 2018-11-21 ENCOUNTER — Other Ambulatory Visit: Payer: Self-pay | Admitting: Nurse Practitioner

## 2018-11-21 MED ORDER — CARISOPRODOL 350 MG PO TABS: ORAL_TABLET | ORAL | 0 refills | Status: DC

## 2018-11-21 NOTE — Telephone Encounter (Signed)
Requested medication (s) are due for refill today: Yes  Requested medication (s) are on the active medication list: Yes  Last refill:  09/28/18  Future visit scheduled: Yes  Notes to clinic:  See request    Requested Prescriptions  Pending Prescriptions Disp Refills   carisoprodol (SOMA) 350 MG tablet [Pharmacy Med Name: Carisoprodol 350 MG Oral Tablet] 90 tablet 0    Sig: TAKE 1 TABLET BY MOUTH 2 TO 3 TIMES DAILY IF TOLERATED.     Not Delegated - Analgesics:  Muscle Relaxants Failed - 11/19/2018 12:02 AM      Failed - This refill cannot be delegated      Passed - Valid encounter within last 6 months    Recent Outpatient Visits          1 month ago Benign hypertension   [place], [place] T, NP   3 months ago COPD, severe [facility])   [place], Adriana M, PA-C   4 months ago Leukocytosis, unspecified type   [place], NP   7 months ago Benign hypertension   [facility] [name], NP   10 months ago Annual physical exam   [facility] [name], NP      Future Appointments            In 1 month Cannady, Barbaraann Faster, NP [employer]

## 2018-11-21 NOTE — Progress Notes (Signed)
Patient requesting refill on Soma with a 30 day supply = 90 tablets,  On review previous scripts have been for 90 tablets with 0 refills, will switch current script to previous format and monitor use.  Controlled substance.  Checked data base and no other refills of controlled substances noted, since previous Soma refill.

## 2018-11-22 ENCOUNTER — Telehealth: Payer: Self-pay

## 2018-11-22 NOTE — Telephone Encounter (Signed)
PA submitted for carisoprodol [name]) Pending review  Key: [id number]

## 2018-11-28 NOTE — Telephone Encounter (Signed)
Prior Authorization Approved 

## 2018-12-02 ENCOUNTER — Other Ambulatory Visit: Payer: Self-pay | Admitting: Unknown Physician Specialty

## 2018-12-07 ENCOUNTER — Other Ambulatory Visit: Payer: Self-pay | Admitting: Family Medicine

## 2018-12-08 NOTE — Telephone Encounter (Signed)
Requested Prescriptions  Pending Prescriptions Disp Refills  . hydrOXYzine (ATARAX/VISTARIL) 10 MG tablet [Pharmacy Med Name: hydrOXYzine HCl 10 MG Oral Tablet] 90 tablet 0    Sig: TAKE 1 TABLET BY MOUTH THREE TIMES DAILY AS NEEDED     Ear, Nose, and Throat:  Antihistamines Passed - 12/07/2018 11:55 PM      Passed - Valid encounter within last 12 months    Recent Outpatient Visits          2 months ago Benign hypertension   [place], [place] T, NP   4 months ago COPD, severe [facility])   [place], Adriana M, PA-C   5 months ago Leukocytosis, unspecified type   [place], NP   8 months ago Benign hypertension   [facility] [name], NP   11 months ago Annual physical exam   [facility] [name], NP      Future Appointments            In 3 weeks Cannady, Barbaraann Faster, NP [employer]

## 2018-12-19 ENCOUNTER — Other Ambulatory Visit: Payer: Self-pay | Admitting: Unknown Physician Specialty

## 2018-12-19 ENCOUNTER — Other Ambulatory Visit: Payer: Self-pay | Admitting: Family Medicine

## 2018-12-19 NOTE — Telephone Encounter (Signed)
Requested medication (s) are due for refill today: Yes  Requested medication (s) are on the active medication list: Yes  Last refill:  06/28/18  Future visit scheduled: Yes  Notes to clinic:  Unable to refill per protocol, not delegated, failed items on protocol     Requested Prescriptions  Pending Prescriptions Disp Refills   busPIRone (BUSPAR) 10 MG tablet [Pharmacy Med Name: busPIRone HCl 10 MG Oral Tablet] 270 tablet 0    Sig: TAKE 1 TABLET BY MOUTH THREE TIMES DAILY     Not Delegated - Psychiatry:  Anxiolytics/Hypnotics Failed - 12/19/2018  5:45 PM      Failed - This refill cannot be delegated      Failed - Urine Drug Screen completed in last 360 days.      Passed - Valid encounter within last 6 months    Recent Outpatient Visits          2 months ago Benign hypertension   [place], Pierron T, NP   4 months ago COPD, severe [facility])   [place], Adriana M, PA-C   5 months ago Leukocytosis, unspecified type   [place], NP   8 months ago Benign hypertension   [facility] [name], NP   11 months ago Annual physical exam   [facility] [name], NP      Future Appointments            In 1 week Cannady, Barbaraann Faster, NP [facility]

## 2018-12-19 NOTE — Telephone Encounter (Signed)
Patient called back confirming she does not need hydroxyzine at this time.

## 2018-12-19 NOTE — Telephone Encounter (Signed)
Left message to CB, requesting too early.

## 2018-12-19 NOTE — Telephone Encounter (Signed)
Requested medication (s) are due for refill today: yes  Requested medication (s) are on the active medication list: yes  Last refill:  Last filled by historical provider  Future visit scheduled: yes  Notes to clinic:  Medication last filled by historical provider    Requested Prescriptions  Pending Prescriptions Disp Refills   furosemide (LASIX) 20 MG tablet [Pharmacy Med Name: Furosemide 20 MG Oral Tablet] 90 tablet 0    Sig: TAKE 1 TABLET BY MOUTH ONCE DAILY     Cardiovascular:  Diuretics - Loop Passed - 12/19/2018  4:06 PM      Passed - K in normal range and within 360 days    Potassium  Date Value Ref Range Status  07/28/2018 4.7 3.5 - 5.2 mmol/L Final  03/23/2014 3.6 3.5 - 5.1 mmol/L Final         Passed - Ca in normal range and within 360 days    Calcium  Date Value Ref Range Status  07/28/2018 9.4 8.7 - 10.3 mg/dL Final   Calcium, Total  Date Value Ref Range Status  03/23/2014 9.3 8.5 - 10.1 mg/dL Final         Passed - Na in normal range and within 360 days    Sodium  Date Value Ref Range Status  07/28/2018 139 134 - 144 mmol/L Final  03/23/2014 142 136 - 145 mmol/L Final         Passed - Cr in normal range and within 360 days    Creatinine  Date Value Ref Range Status  03/23/2014 0.99 0.60 - 1.30 mg/dL Final   Creatinine, Ser  Date Value Ref Range Status  07/28/2018 0.97 0.57 - 1.00 mg/dL Final         Passed - Last BP in normal range    BP Readings from Last 1 Encounters:  09/28/18 131/75         Passed - Valid encounter within last 6 months    Recent Outpatient Visits          2 months ago Benign hypertension   [place], [place] T, NP   4 months ago COPD, severe [facility])   [place], Adriana M, PA-C   5 months ago Leukocytosis, unspecified type   [place], NP   8 months ago Benign hypertension   [facility] [name], NP   11 months ago Annual  physical exam   [facility] [name], NP      Future Appointments            In 1 week Venita Lick, NP [facility]

## 2018-12-22 ENCOUNTER — Other Ambulatory Visit: Payer: Self-pay | Admitting: Nurse Practitioner

## 2018-12-22 NOTE — Telephone Encounter (Signed)
Requested medication (s) are due for refill today: Yes  Requested medication (s) are on the active medication list: Yes  Last refill:  11/21/18  Future visit scheduled: Yes  Notes to clinic:  See request    Requested Prescriptions  Pending Prescriptions Disp Refills   carisoprodol (SOMA) 350 MG tablet [Pharmacy Med Name: Carisoprodol 350 MG Oral Tablet] 90 tablet 0    Sig: TAKE 1 TABLET BY MOUTH TWO TO THREE TIMES DAILY AS NEEDED, IF TOLERATED.     Not Delegated - Analgesics:  Muscle Relaxants Failed - 12/22/2018 12:03 AM      Failed - This refill cannot be delegated      Passed - Valid encounter within last 6 months    Recent Outpatient Visits          2 months ago Benign hypertension   [place], [place] T, NP   4 months ago COPD, severe [facility])   [place], Adriana M, PA-C   5 months ago Leukocytosis, unspecified type   [place], NP   8 months ago Benign hypertension   [facility] [name], NP   12 months ago Annual physical exam   [facility] [name], NP      Future Appointments            In 1 week Cannady, Barbaraann Faster, NP [facility]

## 2018-12-29 ENCOUNTER — Encounter: Payer: Self-pay | Admitting: Nurse Practitioner

## 2018-12-29 DIAGNOSIS — E559 Vitamin D deficiency, unspecified: Secondary | ICD-10-CM | POA: Insufficient documentation

## 2018-12-30 ENCOUNTER — Encounter: Payer: Self-pay | Admitting: Nurse Practitioner

## 2018-12-30 ENCOUNTER — Other Ambulatory Visit: Payer: Self-pay

## 2018-12-30 ENCOUNTER — Ambulatory Visit: Payer: BLUE CROSS/BLUE SHIELD | Admitting: Nurse Practitioner

## 2018-12-30 VITALS — BP 118/65 | HR 96 | Temp 99.0°F | Ht 63.0 in | Wt 255.0 lb

## 2018-12-30 DIAGNOSIS — F419 Anxiety disorder, unspecified: Secondary | ICD-10-CM | POA: Diagnosis not present

## 2018-12-30 DIAGNOSIS — I10 Essential (primary) hypertension: Secondary | ICD-10-CM | POA: Diagnosis not present

## 2018-12-30 DIAGNOSIS — Z1211 Encounter for screening for malignant neoplasm of colon: Secondary | ICD-10-CM

## 2018-12-30 DIAGNOSIS — Z1239 Encounter for other screening for malignant neoplasm of breast: Secondary | ICD-10-CM

## 2018-12-30 DIAGNOSIS — J449 Chronic obstructive pulmonary disease, unspecified: Secondary | ICD-10-CM | POA: Diagnosis not present

## 2018-12-30 DIAGNOSIS — F324 Major depressive disorder, single episode, in partial remission: Secondary | ICD-10-CM | POA: Diagnosis not present

## 2018-12-30 DIAGNOSIS — F17210 Nicotine dependence, cigarettes, uncomplicated: Secondary | ICD-10-CM

## 2018-12-30 DIAGNOSIS — R635 Abnormal weight gain: Secondary | ICD-10-CM

## 2018-12-30 MED ORDER — CARISOPRODOL 350 MG PO TABS: ORAL_TABLET | ORAL | 0 refills | Status: DC

## 2018-12-30 MED ORDER — AMITRIPTYLINE HCL 25 MG PO TABS: 50.0000 mg | ORAL_TABLET | Freq: Every day | ORAL | 3 refills | Status: DC

## 2018-12-30 NOTE — Assessment & Plan Note (Signed)
Chronic, ongoing.  Continue current medication regimen.  BP below goal today.

## 2018-12-30 NOTE — Assessment & Plan Note (Signed)
I have recommended complete cessation of tobacco use. I have discussed various options available for assistance with tobacco cessation including over the counter methods (Nicotine gum, patch and lozenges). We also discussed prescription options (Chantix, Nicotine Inhaler / Nasal Spray). The patient is not interested in pursuing any prescription tobacco cessation options at this time.  

## 2018-12-30 NOTE — Patient Instructions (Signed)

## 2018-12-30 NOTE — Assessment & Plan Note (Signed)
Chronic, ongoing.  Continue current medication regimen.  Refills supplied.

## 2018-12-30 NOTE — Progress Notes (Signed)
BP 118/65   Pulse 96   Temp 99 F (37.2 C) (Oral)   Ht '5\' 3"'  (1.6 m)   Wt 255 lb (115.7 kg)   LMP  (LMP Unknown)   SpO2 95%   BMI 45.17 kg/m    Subjective:    Patient ID: [name], female    DOB: 08/14/1956, 63 y.o.   MRN: [id number]  HPI: [name] is a 63 y.o. female  Chief Complaint  Patient presents with  . Hypertension    f/u  . Anxiety  . Medication Refill    amitriptylin   HYPERTENSION Hypertension status: stable  Satisfied with current treatment? yes Duration of hypertension: chronic BP monitoring frequency:  daily BP range: 110-120/70-80 BP medication side effects:  no Medication compliance: good compliance Aspirin: yes Recurrent headaches: no Visual changes: no Palpitations: no Dyspnea: no Chest pain: no Lower extremity edema: no Dizzy/lightheaded: no   ANXIETY/STRESS Duration:stable Anxious mood: no  Excessive worrying: no Irritability: no  Sweating: no Nausea: no Palpitations:no Hyperventilation: no Panic attacks: yes, had one over past three months Agoraphobia: no  Obscessions/compulsions: no Depressed mood: no Depression screen [facility] 2/9 12/30/2018 09/28/2018 06/28/2018 05/18/2018 05/11/2018  Decreased Interest 0 0 1 0 0  Down, Depressed, Hopeless 0 0 1 0 0  PHQ - 2 Score 0 0 2 0 0  Altered sleeping 1 0 0 - -  Tired, decreased energy 1 0 0 - -  Change in appetite - 0 0 - -  Feeling bad or failure about yourself  0 0 0 - -  Trouble concentrating 0 - 1 - -  Moving slowly or fidgety/restless 0 0 0 - -  Suicidal thoughts 0 0 0 - -  PHQ-9 Score 2 0 3 - -  Difficult doing work/chores Not difficult at all - - - -  Some recent data might be hidden   Anhedonia: no Weight changes: no Insomnia: yes hard to fall asleep  Hypersomnia: no Fatigue/loss of energy: no Feelings of worthlessness: no Feelings of guilt: no Impaired concentration/indecisiveness: no Suicidal ideations: no  Crying spells: no Recent Stressors/Life Changes:  no   Relationship problems: no   Family stress: no     Financial stress: no    Job stress: no    Recent death/loss: no  COPD Continues to smoke one pack per day.  She reports "I need to quit", but is not interested.   COPD status: stable Satisfied with current treatment?: yes Oxygen use: no Dyspnea frequency: improved with Incruse Cough frequency:  Rescue inhaler frequency:   Limitation of activity: at times Productive cough:  Last Spirometry:  Pneumovax: Up to Date Influenza: Up to Date  Relevant past medical, surgical, family and social history reviewed and updated as indicated. Interim medical history since our last visit reviewed. Allergies and medications reviewed and updated.  Review of Systems  Constitutional: Negative for activity change, appetite change, diaphoresis, fatigue and fever.  Respiratory: Negative for cough, chest tightness and shortness of breath.   Cardiovascular: Negative for chest pain, palpitations and leg swelling.  Gastrointestinal: Negative for abdominal distention, abdominal pain, constipation, diarrhea, nausea and vomiting.  Endocrine: Negative for cold intolerance, heat intolerance, polydipsia, polyphagia and polyuria.  Neurological: Negative for dizziness, syncope, weakness, light-headedness, numbness and headaches.  Psychiatric/Behavioral: Negative.     Per HPI unless specifically indicated above     Objective:    BP 118/65   Pulse 96   Temp 99 F (37.2 C) (Oral)  Ht '5\' 3"'  (1.6 m)   Wt 255 lb (115.7 kg)   LMP  (LMP Unknown)   SpO2 95%   BMI 45.17 kg/m   Wt Readings from Last 3 Encounters:  12/30/18 255 lb (115.7 kg)  09/28/18 257 lb (116.6 kg)  09/20/18 255 lb (115.7 kg)    Physical Exam Vitals signs and nursing note reviewed.  Constitutional:      General: She is awake.     Appearance: She is well-developed.  HENT:     Head: Normocephalic.     Right Ear: Hearing normal.     Left Ear: Hearing normal.     Nose: Nose  normal.     Mouth/Throat:     Mouth: Mucous membranes are moist.  Eyes:     General: Lids are normal.        Right eye: No discharge.        Left eye: No discharge.     Conjunctiva/sclera: Conjunctivae normal.     Pupils: Pupils are equal, round, and reactive to light.  Neck:     Musculoskeletal: Normal range of motion and neck supple.     Thyroid: No thyromegaly.     Vascular: No carotid bruit or JVD.  Cardiovascular:     Rate and Rhythm: Normal rate and regular rhythm.     Heart sounds: Normal heart sounds. No murmur. No gallop.   Pulmonary:     Effort: Pulmonary effort is normal.     Breath sounds: Normal breath sounds.  Abdominal:     General: Bowel sounds are normal.     Palpations: Abdomen is soft. There is no hepatomegaly or splenomegaly.  Musculoskeletal:     Right lower leg: No edema.     Left lower leg: No edema.  Lymphadenopathy:     Cervical: No cervical adenopathy.  Skin:    General: Skin is warm and dry.  Neurological:     Mental Status: She is alert and oriented to person, place, and time.  Psychiatric:        Attention and Perception: Attention normal.        Mood and Affect: Mood normal.        Behavior: Behavior normal. Behavior is cooperative.        Thought Content: Thought content normal.        Judgment: Judgment normal.     Results for orders placed or performed in visit on 07/28/18  Comprehensive metabolic panel  Result Value Ref Range   Glucose 91 65 - 99 mg/dL   BUN 15 8 - 27 mg/dL   Creatinine, Ser 0.97 0.57 - 1.00 mg/dL   GFR calc non Af Amer 63 >59 mL/min/1.73   GFR calc Af Amer 72 >59 mL/min/1.73   BUN/Creatinine Ratio 15 12 - 28   Sodium 139 134 - 144 mmol/L   Potassium 4.7 3.5 - 5.2 mmol/L   Chloride 99 96 - 106 mmol/L   CO2 22 20 - 29 mmol/L   Calcium 9.4 8.7 - 10.3 mg/dL   Total Protein 6.9 6.0 - 8.5 g/dL   Albumin 4.1 3.6 - 4.8 g/dL   Globulin, Total 2.8 1.5 - 4.5 g/dL   Albumin/Globulin Ratio 1.5 1.2 - 2.2   Bilirubin Total  0.2 0.0 - 1.2 mg/dL   Alkaline Phosphatase 107 39 - 117 IU/L   AST 19 0 - 40 IU/L   ALT 14 0 - 32 IU/L      Assessment & Plan:   Problem  List Items Addressed This Visit      Cardiovascular and Mediastinum   Benign hypertension    Chronic, ongoing.  Continue current medication regimen.  BP below goal today.      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  Continue current medication regimen.  Not O2 dependent at this time.  Continue to encourage smoking cessation.        Other   Anxiety    Chronic, ongoing.  Continue current medication regimen.  Refills supplied.      Relevant Medications   amitriptyline (ELAVIL) 25 MG tablet   Depression, major, single episode, in partial remission (HCC)    Chronic, ongoing.  Continue current medication regimen.        Relevant Medications   amitriptyline (ELAVIL) 25 MG tablet   Nicotine dependence, cigarettes, uncomplicated    I have recommended complete cessation of tobacco use. I have discussed various options available for assistance with tobacco cessation including over the counter methods (Nicotine gum, patch and lozenges). We also discussed prescription options (Chantix, Nicotine Inhaler / Nasal Spray). The patient is not interested in pursuing any prescription tobacco cessation options at this time.      Abnormal weight gain    Reports continued weight loss.  Check kidney function today with ongoing Metformin use.       Other Visit Diagnoses    Colon cancer screening       Relevant Orders   Ambulatory referral to Gastroenterology   Breast cancer screening       Relevant Orders   MM DIGITAL SCREENING BILATERAL       Follow up plan: Return in about 3 months (around 03/30/2019) for HTN, HLD, Anxiety.

## 2018-12-30 NOTE — Assessment & Plan Note (Signed)
Chronic, ongoing.  Continue current medication regimen.   

## 2018-12-30 NOTE — Assessment & Plan Note (Signed)
Reports continued weight loss.  Check kidney function today with ongoing Metformin use.

## 2018-12-30 NOTE — Assessment & Plan Note (Signed)
Chronic, ongoing.  Continue current medication regimen.  Not O2 dependent at this time.  Continue to encourage smoking cessation.

## 2018-12-31 LAB — COMPREHENSIVE METABOLIC PANEL
ALT: 26 IU/L (ref 0–32)
AST: 30 IU/L (ref 0–40)
Albumin/Globulin Ratio: 1.2 (ref 1.2–2.2)
Albumin: 3.6 g/dL — ABNORMAL LOW (ref 3.8–4.8)
Alkaline Phosphatase: 155 IU/L — ABNORMAL HIGH (ref 39–117)
BUN/Creatinine Ratio: 15 (ref 12–28)
BUN: 14 mg/dL (ref 8–27)
Bilirubin Total: 0.2 mg/dL (ref 0.0–1.2)
CO2: 22 mmol/L (ref 20–29)
Calcium: 9.7 mg/dL (ref 8.7–10.3)
Chloride: 99 mmol/L (ref 96–106)
Creatinine, Ser: 0.91 mg/dL (ref 0.57–1.00)
GFR calc Af Amer: 78 mL/min/{1.73_m2} (ref >59)
GFR calc non Af Amer: 67 mL/min/{1.73_m2} (ref >59)
Globulin, Total: 3.1 g/dL (ref 1.5–4.5)
Glucose: 111 mg/dL — ABNORMAL HIGH (ref 65–99)
Potassium: 4.9 mmol/L (ref 3.5–5.2)
Sodium: 140 mmol/L (ref 134–144)
Total Protein: 6.7 g/dL (ref 6.0–8.5)

## 2019-01-04 ENCOUNTER — Other Ambulatory Visit: Payer: Self-pay

## 2019-01-04 DIAGNOSIS — Z1211 Encounter for screening for malignant neoplasm of colon: Secondary | ICD-10-CM

## 2019-01-07 ENCOUNTER — Other Ambulatory Visit: Payer: Self-pay | Admitting: Family Medicine

## 2019-01-07 ENCOUNTER — Other Ambulatory Visit: Payer: Self-pay | Admitting: Unknown Physician Specialty

## 2019-01-09 NOTE — Telephone Encounter (Signed)
Requested medication (s) are due for refill today: yes  Requested medication (s) are on the active medication list: yes  Last refill:  09/28/18 for 90 and 3 refills  Future visit scheduled: yes  Notes to clinic:  Biguanides failed. Last HA1C was 06/28/18.  Requested Prescriptions  Pending Prescriptions Disp Refills   metFORMIN (GLUCOPHAGE) 500 MG tablet [Pharmacy Med Name: metFORMIN HCl 500 MG Oral Tablet] 270 tablet 0    Sig: TAKE 1 TABLET BY MOUTH THREE TIMES DAILY WITH MEALS     Endocrinology:  Diabetes - Biguanides Failed - 01/07/2019  8:41 AM      Failed - HBA1C is between 0 and 7.9 and within 180 days    HB A1C (BAYER DCA - WAIVED)  Date Value Ref Range Status  06/28/2018 5.7 <7.0 % Final    Comment:                                          Diabetic Adult            <7.0                                       Healthy Adult        4.3 - 5.7                                                           (DCCT/NGSP) American Diabetes Association's Summary of Glycemic Recommendations for Adults with Diabetes: Hemoglobin A1c <7.0%. More stringent glycemic goals (A1c <6.0%) may further reduce complications at the cost of increased risk of hypoglycemia.          Passed - Cr in normal range and within 360 days    Creatinine  Date Value Ref Range Status  03/23/2014 0.99 0.60 - 1.30 mg/dL Final   Creatinine, Ser  Date Value Ref Range Status  12/30/2018 0.91 0.57 - 1.00 mg/dL Final         Passed - eGFR in normal range and within 360 days    EGFR (African American)  Date Value Ref Range Status  03/23/2014 >60  Final   GFR calc Af Amer  Date Value Ref Range Status  12/30/2018 78 >59 mL/min/1.73 Final   EGFR (Non-African Amer.)  Date Value Ref Range Status  03/23/2014 >60  Final    Comment:    eGFR values <83m/min/1.73 m2 may be an indication of chronic kidney disease (CKD). Calculated eGFR is useful in patients with stable renal function. The eGFR calculation will not be  reliable in acutely ill patients when serum creatinine is changing rapidly. It is not useful in  patients on dialysis. The eGFR calculation may not be applicable to patients at the low and high extremes of body sizes, pregnant women, and vegetarians.    GFR calc non Af Amer  Date Value Ref Range Status  12/30/2018 67 >59 mL/min/1.73 Final         Passed - Valid encounter within last 6 months    Recent Outpatient Visits          1 week ago [place], severe [place]  [place], [place] T, NP   3 months ago Benign hypertension   [place], [place] T, NP   5 months ago COPD, severe [facility])   [place], PA-C   6 months ago Leukocytosis, unspecified type   [place], NP   9 months ago Benign hypertension   [facility] [name], NP      Future Appointments            In 2 months Cannady, Barbaraann Faster, NP [facility]

## 2019-01-16 ENCOUNTER — Encounter: Payer: Self-pay | Admitting: *Deleted

## 2019-01-17 ENCOUNTER — Ambulatory Visit: Payer: BLUE CROSS/BLUE SHIELD | Admitting: Anesthesiology

## 2019-01-17 ENCOUNTER — Ambulatory Visit
Admission: RE | Admit: 2019-01-17 | Discharge: 2019-01-17 | Disposition: A | Discharge status: Home / Self Care | Payer: BLUE CROSS/BLUE SHIELD | Source: Ambulatory Visit | Attending: Gastroenterology | Admitting: Gastroenterology

## 2019-01-17 ENCOUNTER — Encounter
Admission: RE | Disposition: A | Discharge status: Home / Self Care | Payer: Self-pay | Source: Ambulatory Visit | Attending: Gastroenterology

## 2019-01-17 ENCOUNTER — Other Ambulatory Visit: Payer: Self-pay

## 2019-01-17 DIAGNOSIS — Z825 Family history of asthma and other chronic lower respiratory diseases: Secondary | ICD-10-CM | POA: Diagnosis not present

## 2019-01-17 DIAGNOSIS — E785 Hyperlipidemia, unspecified: Secondary | ICD-10-CM | POA: Diagnosis not present

## 2019-01-17 DIAGNOSIS — J449 Chronic obstructive pulmonary disease, unspecified: Secondary | ICD-10-CM | POA: Diagnosis not present

## 2019-01-17 DIAGNOSIS — Z791 Long term (current) use of non-steroidal anti-inflammatories (NSAID): Secondary | ICD-10-CM | POA: Insufficient documentation

## 2019-01-17 DIAGNOSIS — K219 Gastro-esophageal reflux disease without esophagitis: Secondary | ICD-10-CM | POA: Insufficient documentation

## 2019-01-17 DIAGNOSIS — D124 Benign neoplasm of descending colon: Secondary | ICD-10-CM | POA: Diagnosis not present

## 2019-01-17 DIAGNOSIS — G4730 Sleep apnea, unspecified: Secondary | ICD-10-CM | POA: Insufficient documentation

## 2019-01-17 DIAGNOSIS — I10 Essential (primary) hypertension: Secondary | ICD-10-CM | POA: Diagnosis not present

## 2019-01-17 DIAGNOSIS — Z7984 Long term (current) use of oral hypoglycemic drugs: Secondary | ICD-10-CM | POA: Diagnosis not present

## 2019-01-17 DIAGNOSIS — Z1211 Encounter for screening for malignant neoplasm of colon: Secondary | ICD-10-CM | POA: Diagnosis not present

## 2019-01-17 DIAGNOSIS — Z90710 Acquired absence of both cervix and uterus: Secondary | ICD-10-CM | POA: Insufficient documentation

## 2019-01-17 DIAGNOSIS — K449 Diaphragmatic hernia without obstruction or gangrene: Secondary | ICD-10-CM | POA: Diagnosis not present

## 2019-01-17 DIAGNOSIS — Z803 Family history of malignant neoplasm of breast: Secondary | ICD-10-CM | POA: Insufficient documentation

## 2019-01-17 DIAGNOSIS — K5730 Diverticulosis of large intestine without perforation or abscess without bleeding: Secondary | ICD-10-CM | POA: Diagnosis not present

## 2019-01-17 DIAGNOSIS — Z809 Family history of malignant neoplasm, unspecified: Secondary | ICD-10-CM | POA: Insufficient documentation

## 2019-01-17 DIAGNOSIS — D120 Benign neoplasm of cecum: Secondary | ICD-10-CM

## 2019-01-17 DIAGNOSIS — F17200 Nicotine dependence, unspecified, uncomplicated: Secondary | ICD-10-CM | POA: Diagnosis not present

## 2019-01-17 DIAGNOSIS — M810 Age-related osteoporosis without current pathological fracture: Secondary | ICD-10-CM | POA: Insufficient documentation

## 2019-01-17 DIAGNOSIS — Z820 Family history of epilepsy and other diseases of the nervous system: Secondary | ICD-10-CM | POA: Diagnosis not present

## 2019-01-17 DIAGNOSIS — Z8249 Family history of ischemic heart disease and other diseases of the circulatory system: Secondary | ICD-10-CM | POA: Insufficient documentation

## 2019-01-17 DIAGNOSIS — Z79899 Other long term (current) drug therapy: Secondary | ICD-10-CM | POA: Insufficient documentation

## 2019-01-17 DIAGNOSIS — Z807 Family history of other malignant neoplasms of lymphoid, hematopoietic and related tissues: Secondary | ICD-10-CM | POA: Insufficient documentation

## 2019-01-17 DIAGNOSIS — M1990 Unspecified osteoarthritis, unspecified site: Secondary | ICD-10-CM | POA: Insufficient documentation

## 2019-01-17 DIAGNOSIS — Z7982 Long term (current) use of aspirin: Secondary | ICD-10-CM | POA: Diagnosis not present

## 2019-01-17 DIAGNOSIS — Z800 Family history of malignant neoplasm of digestive organs: Secondary | ICD-10-CM | POA: Insufficient documentation

## 2019-01-17 DIAGNOSIS — Z6841 Body Mass Index (BMI) 40.0 and over, adult: Secondary | ICD-10-CM | POA: Insufficient documentation

## 2019-01-17 HISTORY — PX: COLONOSCOPY WITH PROPOFOL: SHX5780

## 2019-01-17 SURGERY — COLONOSCOPY WITH PROPOFOL
Anesthesia: General

## 2019-01-17 MED ORDER — PROPOFOL 500 MG/50ML IV EMUL
INTRAVENOUS | Status: AC
  Filled 2019-01-17: qty 50

## 2019-01-17 MED ORDER — PROPOFOL 10 MG/ML IV BOLUS
INTRAVENOUS | Status: DC | PRN
  Administered 2019-01-17: 10 mg via INTRAVENOUS
  Administered 2019-01-17: 60 mg via INTRAVENOUS
  Administered 2019-01-17: 80 mg via INTRAVENOUS

## 2019-01-17 MED ORDER — SODIUM CHLORIDE 0.9 % IV SOLN
INTRAVENOUS | Status: DC
  Administered 2019-01-17: 08:00:00 via INTRAVENOUS

## 2019-01-17 MED ORDER — PHENYLEPHRINE HCL 10 MG/ML IJ SOLN
INTRAMUSCULAR | Status: DC | PRN
  Administered 2019-01-17 (×2): 300 ug via INTRAVENOUS
  Administered 2019-01-17: 200 ug via INTRAVENOUS
  Administered 2019-01-17: 100 ug via INTRAVENOUS

## 2019-01-17 MED ORDER — LIDOCAINE HCL (CARDIAC) PF 100 MG/5ML IV SOSY
PREFILLED_SYRINGE | INTRAVENOUS | Status: DC | PRN
  Administered 2019-01-17: 100 mg via INTRAVENOUS

## 2019-01-17 MED ORDER — PROPOFOL 10 MG/ML IV BOLUS
INTRAVENOUS | Status: AC
  Filled 2019-01-17: qty 20

## 2019-01-17 MED ORDER — EPHEDRINE SULFATE 50 MG/ML IJ SOLN
INTRAMUSCULAR | Status: DC | PRN
  Administered 2019-01-17: 5 mg via INTRAVENOUS
  Administered 2019-01-17 (×3): 10 mg via INTRAVENOUS

## 2019-01-17 MED ORDER — PROPOFOL 500 MG/50ML IV EMUL
INTRAVENOUS | Status: DC | PRN
  Administered 2019-01-17: 125 ug/kg/min via INTRAVENOUS

## 2019-01-17 NOTE — Op Note (Signed)
[facility] Gastroenterology Patient Name: [name] Procedure Date: 01/17/2019 7:46 AM MRN: [id number] Account #: [id number] Date of Birth: [birth year] Admit Type: Outpatient Age: 63 Room: [facility] ENDO ROOM 1 Gender: Female Note Status: Finalized Procedure:            Colonoscopy Indications:          Screening in patient at increased risk: Family history                        of 1st-degree relative with colorectal cancer Providers:            Jonathon Bellows MD, MD Medicines:            Monitored Anesthesia Care Complications:        No immediate complications. Procedure:            Pre-Anesthesia Assessment:                       - Prior to the procedure, a History and Physical was                        performed, and patient medications, allergies and                        sensitivities were reviewed. The patient's tolerance of                        previous anesthesia was reviewed.                       - The risks and benefits of the procedure and the                        sedation options and risks were discussed with the                        patient. All questions were answered and informed                        consent was obtained.                       - ASA Grade Assessment: III - A patient with severe                        systemic disease.                       After obtaining informed consent, the colonoscope was                        passed under direct vision. Throughout the procedure,                        the patient's blood pressure, pulse, and oxygen                        saturations were monitored continuously. The                        Colonoscope was introduced through the anus and  advanced to the the cecum, identified by the                        appendiceal orifice, IC valve and transillumination.                        The colonoscopy was performed with ease. The patient                        tolerated  the procedure well. The quality of the bowel                        preparation was good. Findings:      The perianal and digital rectal examinations were normal.      Three sessile polyps were found in the descending colon. The polyps were       4 to 6 mm in size. These polyps were removed with a cold snare.       Resection and retrieval were complete.      A 3 mm polyp was found in the cecum. The polyp was sessile. The polyp       was removed with a cold biopsy forceps. Resection and retrieval were       complete.      Multiple small-mouthed diverticula were found in the sigmoid colon.      The exam was otherwise without abnormality on direct and retroflexion       views. Impression:           - Three 4 to 6 mm polyps in the descending colon,                        removed with a cold snare. Resected and retrieved.                       - One 3 mm polyp in the cecum, removed with a cold                        biopsy forceps. Resected and retrieved.                       - Diverticulosis in the sigmoid colon.                       - The examination was otherwise normal on direct and                        retroflexion views. Recommendation:       - Discharge patient to home (with escort).                       - Resume previous diet.                       - Continue present medications.                       - Await pathology results.                       - Repeat colonoscopy in 3 - 5 years for surveillance  based on pathology results. Procedure Code(s):    --- Professional ---                       [phone], Colonoscopy, flexible; with removal of tumor(s),                        polyp(s), or other lesion(s) by snare technique                       [id number], 63, Colonoscopy, flexible; with biopsy, single                        or multiple Diagnosis Code(s):    --- Professional ---                       Z80.0, Family history of malignant neoplasm of                         digestive organs                       D12.4, Benign neoplasm of descending colon                       D12.0, Benign neoplasm of cecum                       K57.30, Diverticulosis of large intestine without                        perforation or abscess without bleeding CPT copyright 2018 American Medical Association. All rights reserved. The codes documented in this report are preliminary and upon coder review may  be revised to meet current compliance requirements. Jonathon Bellows, MD Jonathon Bellows MD, MD 01/17/2019 8:27:08 AM This report has been signed electronically. Number of Addenda: 0 Note Initiated On: 01/17/2019 7:46 AM Scope Withdrawal Time: 0 hours 13 minutes 6 seconds  Total Procedure Duration: 0 hours 22 minutes 10 seconds       [facility]

## 2019-01-17 NOTE — Anesthesia Post-op Follow-up Note (Signed)
Anesthesia QCDR form completed.        

## 2019-01-17 NOTE — Anesthesia Postprocedure Evaluation (Signed)
Anesthesia Post Note  Patient: [name]  Procedure(s) Performed: COLONOSCOPY WITH PROPOFOL (N/A )  Patient location during evaluation: PACU Anesthesia Type: General Level of consciousness: awake and alert Pain management: pain level controlled Vital Signs Assessment: post-procedure vital signs reviewed and stable Respiratory status: spontaneous breathing, nonlabored ventilation and respiratory function stable Cardiovascular status: blood pressure returned to baseline and stable Postop Assessment: no apparent nausea or vomiting Anesthetic complications: no     Last Vitals:  Vitals:   01/17/19 0842 01/17/19 0852  BP: 114/65 129/75  Pulse: 88 85  Resp: (!) 24 17  Temp:    SpO2: 95% 95%    Last Pain:  Vitals:   01/17/19 0852  TempSrc:   PainSc: 0-No pain                 Durenda Hurt

## 2019-01-17 NOTE — H&P (Signed)
Jonathon Bellows, MD [address] [place], [place], [place], [address] [address], [place], [place], [place], [address] Phone: [phone]  Fax: [phone]  Primary Care Physician:  Venita Lick, NP   Pre-Procedure History & Physical: HPI:  [name] is a 63 y.o. female is here for an colonoscopy.   Past Medical History:  Diagnosis Date  . Arthritis   . COPD (chronic obstructive pulmonary disease) ([place])   . Enlarged heart   . GERD (gastroesophageal reflux disease)   . Hiatal hernia   . Hx of degenerative disc disease   . Hyperlipidemia   . Hypertension   . Mass    On adrenal gland  . Osteoporosis   . Sleep apnea     Past Surgical History:  Procedure Laterality Date  . ABDOMINAL HYSTERECTOMY    . ACHILLES TENDON REPAIR     Removed bone spur and repaired achilles tendon  . APPENDECTOMY    . bone fusion in neck    . HERNIA REPAIR     has not had repair  . TONSILLECTOMY      Prior to Admission medications   Medication Sig Start Date End Date Taking? Authorizing Provider  amitriptyline (ELAVIL) 25 MG tablet Take 2 tablets (50 mg total) by mouth at bedtime. 12/30/18  Yes Cannady, Jolene T, NP  aspirin 81 MG tablet Take 81 mg by mouth daily.   Yes [provider]  atorvastatin (LIPITOR) 40 MG tablet Take 1 tablet (40 mg total) by mouth daily. 03/28/18  Yes [name], NP  buPROPion (WELLBUTRIN XL) 300 MG 24 hr tablet Take 1 tablet (300 mg total) by mouth daily. 06/28/18  Yes [name], NP  busPIRone (BUSPAR) 10 MG tablet TAKE 1 TABLET BY MOUTH THREE TIMES DAILY 12/20/18  Yes Cannady, Jolene T, NP  Calcium Citrate-Vitamin D (CALCIUM + D PO) Take 1,000 mg by mouth daily.   Yes [provider]  carisoprodol (SOMA) 350 MG tablet TAKE 1 TABLET BY MOUTH TWO TO THREE TIMES DAILY AS NEEDED, IF TOLERATED. 12/30/18  Yes Cannady, Jolene T, NP  DULoxetine (CYMBALTA) 60 MG capsule TAKE 1 CAPSULE BY MOUTH ONCE DAILY 09/30/18  Yes [name],  NP  furosemide (LASIX) 20 MG tablet TAKE 1 TABLET BY MOUTH ONCE DAILY 12/19/18  Yes Cannady, Jolene T, NP  gabapentin (NEURONTIN) 800 MG tablet Take 1 tablet (800 mg total) by mouth 3 (three) times daily. 09/28/18  Yes Cannady, Jolene T, NP  hydrOXYzine (ATARAX/VISTARIL) 10 MG tablet TAKE 1 TABLET BY MOUTH THREE TIMES DAILY AS NEEDED 01/09/19  Yes Cannady, Jolene T, NP  INCRUSE ELLIPTA 62.5 MCG/INH AEPB INHALE 1 PUFF BY MOUTH ONCE DAILY 10/24/18  Yes Cannady, Jolene T, NP  metFORMIN (GLUCOPHAGE) 500 MG tablet TAKE 1 TABLET BY MOUTH THREE TIMES DAILY WITH MEALS 01/09/19  Yes Cannady, Jolene T, NP  Multiple Vitamins-Minerals (MULTIVITAMIN WITH MINERALS) tablet Take 1 tablet by mouth daily.   Yes [provider]  omeprazole (PRILOSEC) 40 MG capsule Take 1 capsule (40 mg total) by mouth daily. 03/28/18  Yes [name], NP  potassium chloride SA (K-DUR,KLOR-CON) 20 MEQ tablet TAKE 1 TABLET BY MOUTH ONCE DAILY 12/05/18  Yes Cannady, Jolene T, NP  telmisartan-hydrochlorothiazide (MICARDIS HCT) 40-12.5 MG tablet Take 1 tablet by mouth daily. 06/24/18  Yes [name], NP  albuterol (PROAIR HFA) 108 (90 Base) MCG/ACT inhaler Inhale 2 puffs into the lungs every 6 (six) hours as needed for wheezing or shortness  of breath. 06/28/18   [name], NP  ibuprofen (ADVIL,MOTRIN) 200 MG tablet Take 800 mg by mouth every 4 (four) hours as needed.    [provider]  ondansetron (ZOFRAN) 4 MG tablet Take 1 tablet (4 mg total) by mouth every 8 (eight) hours as needed for nausea or vomiting. Patient not taking: Reported on 01/17/2019 03/30/18   [name], NP    Allergies as of 01/04/2019  . (No Known Allergies)    Family History  Problem Relation Age of Onset  . COPD Father   . Heart disease Father   . Hyperlipidemia Father   . Hypertension Father   . Dementia Father   . Cancer Sister   . Hyperlipidemia Sister   . Hypertension Sister   . Lymphoma Sister   . Migraines Son   . Stroke  Maternal Grandfather   . Heart attack Brother   . Cancer Brother        colorectal and liver  . Breast cancer Paternal Aunt   . Breast cancer Paternal Aunt     Social History   Socioeconomic History  . Marital status: Married    Spouse name: Not on file  . Number of children: Not on file  . Years of education: Not on file  . Highest education level: Not on file  Occupational History  . Occupation: disabled  Social Needs  . Financial resource strain: Not on file  . Food insecurity:    Worry: Not on file    Inability: Not on file  . Transportation needs:    Medical: Not on file    Non-medical: Not on file  Tobacco Use  . Smoking status: Current Every Day Smoker    Packs/day: 1.00    Years: 48.00    Pack years: 48.00    Types: Cigarettes  . Smokeless tobacco: Never Used  . Tobacco comment: pt states she plans to begin using patch to quit smoking  Substance and Sexual Activity  . Alcohol use: No    Alcohol/week: 0.0 standard drinks  . Drug use: No  . Sexual activity: Never    Partners: Male  Lifestyle  . Physical activity:    Days per week: Not on file    Minutes per session: Not on file  . Stress: Not on file  Relationships  . Social connections:    Talks on phone: Not on file    Gets together: Not on file    Attends religious service: Not on file    Active member of club or organization: Not on file    Attends meetings of clubs or organizations: Not on file    Relationship status: Not on file  . Intimate partner violence:    Fear of current or ex partner: Not on file    Emotionally abused: Not on file    Physically abused: Not on file    Forced sexual activity: Not on file  Other Topics Concern  . Not on file  Social History Narrative  . Not on file    Review of Systems: See HPI, otherwise negative ROS  Physical Exam: BP (!) 146/62   Pulse 95   Temp 98 F (36.7 C) (Tympanic)   Resp 16   Ht 5\' 3"  (1.6 m)   Wt 115.2 kg   LMP  (LMP Unknown)    SpO2 96%   BMI 44.99 kg/m  General:   Alert,  pleasant and cooperative in NAD Head:  Normocephalic and atraumatic. Neck:  Supple; no masses or thyromegaly. Lungs:  Clear throughout to auscultation, normal respiratory effort.    Heart:  +S1, +S2, Regular rate and rhythm, No edema. Abdomen:  Soft, nontender and nondistended. Normal bowel sounds, without guarding, and without rebound.   Neurologic:  Alert and  oriented x4;  grossly normal neurologically.  Impression/Plan: IDAMAY HOSEIN is here for an colonoscopy to be performed for Screening colonoscopy brither had colon cancer. Risks, benefits, limitations, and alternatives regarding  colonoscopy have been reviewed with the patient.  Questions have been answered.  All parties agreeable.   Jonathon Bellows, MD  01/17/2019, 7:58 AM

## 2019-01-17 NOTE — Anesthesia Preprocedure Evaluation (Addendum)
Anesthesia Evaluation  Patient identified by MRN, date of birth, ID band Patient awake    Reviewed: Allergy & Precautions, H&P , NPO status , Patient's Chart, lab work & pertinent test results  Airway Mallampati: III       Dental  (+) Upper Dentures, Lower Dentures   Pulmonary shortness of breath and with exertion, sleep apnea , COPD (severe), Current Smoker,           Cardiovascular hypertension,      Neuro/Psych PSYCHIATRIC DISORDERS Anxiety Depression negative neurological ROS     GI/Hepatic Neg liver ROS, hiatal hernia, GERD  Controlled,  Endo/Other  Morbid obesity  Renal/GU negative Renal ROS  negative genitourinary   Musculoskeletal  (+) Arthritis ,   Abdominal   Peds  Hematology negative hematology ROS (+)   Anesthesia Other Findings Past Medical History: No date: Arthritis No date: COPD (chronic obstructive pulmonary disease) (HCC) No date: Enlarged heart No date: GERD (gastroesophageal reflux disease) No date: Hiatal hernia No date: Hx of degenerative disc disease No date: Hyperlipidemia No date: Hypertension No date: Mass     Comment:  On adrenal gland No date: Osteoporosis No date: Sleep apnea  Past Surgical History: No date: ABDOMINAL HYSTERECTOMY No date: ACHILLES TENDON REPAIR     Comment:  Removed bone spur and repaired achilles tendon No date: APPENDECTOMY No date: bone fusion in neck No date: HERNIA REPAIR     Comment:  has not had repair No date: TONSILLECTOMY  BMI    Body Mass Index:  44.99 kg/m      Reproductive/Obstetrics negative OB ROS                            Anesthesia Physical Anesthesia Plan  ASA: III  Anesthesia Plan: General   Post-op Pain Management:    Induction:   PONV Risk Score and Plan: Propofol infusion and TIVA  Airway Management Planned:   Additional Equipment:   Intra-op Plan:   Post-operative Plan:   Informed  Consent: I have reviewed the patients History and Physical, chart, labs and discussed the procedure including the risks, benefits and alternatives for the proposed anesthesia with the patient or authorized representative who has indicated his/her understanding and acceptance.     Dental Advisory Given  Plan Discussed with: Anesthesiologist and CRNA  Anesthesia Plan Comments:       Anesthesia Quick Evaluation

## 2019-01-17 NOTE — Transfer of Care (Signed)
Immediate Anesthesia Transfer of Care Note  Patient: [name]  Procedure(s) Performed: COLONOSCOPY WITH PROPOFOL (N/A )  Patient Location: PACU  Anesthesia Type:General  Level of Consciousness: awake, alert  and oriented  Airway & Oxygen Therapy: Patient Spontanous Breathing and Patient connected to nasal cannula oxygen  Post-op Assessment: Report given to RN  Post vital signs: Reviewed and stable  Last Vitals:  Vitals Value Taken Time  BP 102/47 01/17/2019  8:33 AM  Temp 36.1 C 01/17/2019  8:32 AM  Pulse 86 01/17/2019  8:34 AM  Resp 16 01/17/2019  8:34 AM  SpO2 98 % 01/17/2019  8:34 AM  Vitals shown include unvalidated device data.  Last Pain:  Vitals:   01/17/19 0832  TempSrc: Tympanic  PainSc:          Complications: No apparent anesthesia complications

## 2019-01-18 ENCOUNTER — Encounter: Payer: Self-pay | Admitting: Gastroenterology

## 2019-01-18 LAB — SURGICAL PATHOLOGY

## 2019-01-22 ENCOUNTER — Encounter: Payer: Self-pay | Admitting: Gastroenterology

## 2019-01-24 ENCOUNTER — Other Ambulatory Visit: Payer: Self-pay | Admitting: Nurse Practitioner

## 2019-01-24 ENCOUNTER — Other Ambulatory Visit: Payer: Self-pay | Admitting: Unknown Physician Specialty

## 2019-01-24 DIAGNOSIS — J449 Chronic obstructive pulmonary disease, unspecified: Secondary | ICD-10-CM

## 2019-01-25 ENCOUNTER — Other Ambulatory Visit: Payer: Self-pay

## 2019-01-25 MED ORDER — TELMISARTAN-HCTZ 40-12.5 MG PO TABS: 1.0000 | ORAL_TABLET | Freq: Every day | ORAL | 6 refills | Status: DC

## 2019-01-25 NOTE — Telephone Encounter (Signed)
Patient last seen 12/30/18 and has appointment 03/31/19.

## 2019-02-06 ENCOUNTER — Other Ambulatory Visit: Payer: Self-pay | Admitting: Nurse Practitioner

## 2019-02-06 DIAGNOSIS — Z1231 Encounter for screening mammogram for malignant neoplasm of breast: Secondary | ICD-10-CM

## 2019-02-11 ENCOUNTER — Other Ambulatory Visit: Payer: Self-pay | Admitting: Nurse Practitioner

## 2019-02-13 NOTE — Telephone Encounter (Signed)
Requested medication (s) are due for refill today: yes  Requested medication (s) are on the active medication list: yes  Last refill:  12/30/2018  Future visit scheduled: yes  Notes to clinic:  Analgesics: muscle relaxants failed.  Requested Prescriptions  Pending Prescriptions Disp Refills   carisoprodol (SOMA) 350 MG tablet [Pharmacy Med Name: Carisoprodol 350 MG Oral Tablet] 90 tablet 0    Sig: TAKE 1 TABLET BY MOUTH TWO TO THREE TIMES DAILY AS NEEDED IF TOLERATED     Not Delegated - Analgesics:  Muscle Relaxants Failed - 02/11/2019  6:07 PM      Failed - This refill cannot be delegated      Passed - Valid encounter within last 6 months    Recent Outpatient Visits          1 month ago COPD, severe ([place])   [place], [place] T, NP   4 months ago Benign hypertension   [place], Clarion T, NP   6 months ago COPD, severe [facility])   [place], Adriana M, PA-C   7 months ago Leukocytosis, unspecified type   [facility] [name], NP   10 months ago Benign hypertension   [facility] [name], NP      Future Appointments            In 1 month Cannady, Barbaraann Faster, NP [employer]         Signed Prescriptions Disp Refills   hydrOXYzine (ATARAX/VISTARIL) 10 MG tablet 90 tablet 0    Sig: TAKE 1 TABLET BY MOUTH THREE TIMES DAILY AS NEEDED     Ear, Nose, and Throat:  Antihistamines Passed - 02/11/2019  6:07 PM      Passed - Valid encounter within last 12 months    Recent Outpatient Visits          1 month ago COPD, severe ([place])   [place] Cannady, [place]mer T, NP   4 months ago Benign hypertension   [employer], [employer] T, NP   6 months ago COPD, severe [facility])   [place], PA-C   7 months ago Leukocytosis, unspecified type   [facility] [name], NP   10 months ago Benign  hypertension   [facility] [name], NP      Future Appointments            In 1 month Cannady, Barbaraann Faster, NP [employer]

## 2019-02-24 ENCOUNTER — Other Ambulatory Visit: Payer: Self-pay

## 2019-02-24 ENCOUNTER — Ambulatory Visit (INDEPENDENT_AMBULATORY_CARE_PROVIDER_SITE_OTHER): Payer: BLUE CROSS/BLUE SHIELD

## 2019-02-24 ENCOUNTER — Other Ambulatory Visit: Payer: Self-pay | Admitting: Nurse Practitioner

## 2019-02-24 DIAGNOSIS — Z23 Encounter for immunization: Secondary | ICD-10-CM | POA: Diagnosis not present

## 2019-02-24 NOTE — Progress Notes (Signed)
PPSV23 order.

## 2019-02-26 ENCOUNTER — Other Ambulatory Visit: Payer: Self-pay | Admitting: Unknown Physician Specialty

## 2019-02-26 ENCOUNTER — Other Ambulatory Visit: Payer: Self-pay | Admitting: Nurse Practitioner

## 2019-03-12 ENCOUNTER — Other Ambulatory Visit: Payer: Self-pay | Admitting: Unknown Physician Specialty

## 2019-03-12 ENCOUNTER — Other Ambulatory Visit: Payer: Self-pay | Admitting: Nurse Practitioner

## 2019-03-20 ENCOUNTER — Other Ambulatory Visit: Payer: Self-pay | Admitting: Nurse Practitioner

## 2019-03-20 NOTE — Telephone Encounter (Signed)
Please advise 

## 2019-03-31 ENCOUNTER — Ambulatory Visit: Payer: BLUE CROSS/BLUE SHIELD | Admitting: Nurse Practitioner

## 2019-04-03 ENCOUNTER — Telehealth: Payer: Self-pay | Admitting: Nurse Practitioner

## 2019-04-03 NOTE — Telephone Encounter (Signed)
Called pt to set up virtual appt no answer left voicemail.

## 2019-04-05 ENCOUNTER — Other Ambulatory Visit: Payer: Self-pay

## 2019-04-05 ENCOUNTER — Ambulatory Visit (INDEPENDENT_AMBULATORY_CARE_PROVIDER_SITE_OTHER): Payer: BLUE CROSS/BLUE SHIELD | Admitting: Nurse Practitioner

## 2019-04-05 ENCOUNTER — Encounter: Payer: Self-pay | Admitting: Nurse Practitioner

## 2019-04-05 VITALS — BP 114/63 | HR 86 | Temp 98.4°F | Ht 63.0 in | Wt 254.0 lb

## 2019-04-05 DIAGNOSIS — I10 Essential (primary) hypertension: Secondary | ICD-10-CM | POA: Diagnosis not present

## 2019-04-05 DIAGNOSIS — F324 Major depressive disorder, single episode, in partial remission: Secondary | ICD-10-CM

## 2019-04-05 DIAGNOSIS — J449 Chronic obstructive pulmonary disease, unspecified: Secondary | ICD-10-CM

## 2019-04-05 DIAGNOSIS — F419 Anxiety disorder, unspecified: Secondary | ICD-10-CM

## 2019-04-05 DIAGNOSIS — E782 Mixed hyperlipidemia: Secondary | ICD-10-CM

## 2019-04-05 MED ORDER — BUSPIRONE HCL 10 MG PO TABS: 10.0000 mg | ORAL_TABLET | Freq: Three times a day (TID) | ORAL | 2 refills | Status: DC

## 2019-04-05 MED ORDER — DULOXETINE HCL 60 MG PO CPEP: 60.0000 mg | ORAL_CAPSULE | Freq: Every day | ORAL | 2 refills | Status: DC

## 2019-04-05 MED ORDER — UMECLIDINIUM BROMIDE 62.5 MCG/INH IN AEPB: 1.0000 | INHALATION_SPRAY | Freq: Every day | RESPIRATORY_TRACT | 2 refills | Status: DC

## 2019-04-05 MED ORDER — FUROSEMIDE 20 MG PO TABS: 20.0000 mg | ORAL_TABLET | Freq: Every day | ORAL | 2 refills | Status: DC

## 2019-04-05 MED ORDER — METFORMIN HCL 500 MG PO TABS: 500.0000 mg | ORAL_TABLET | Freq: Three times a day (TID) | ORAL | 2 refills | Status: DC

## 2019-04-05 MED ORDER — CARISOPRODOL 350 MG PO TABS: ORAL_TABLET | ORAL | 2 refills | Status: DC

## 2019-04-05 MED ORDER — AMITRIPTYLINE HCL 25 MG PO TABS: 50.0000 mg | ORAL_TABLET | Freq: Every day | ORAL | 3 refills | Status: DC

## 2019-04-05 NOTE — Patient Instructions (Signed)

## 2019-04-05 NOTE — Assessment & Plan Note (Signed)
Chronic, ongoing.  Continue current medication regimen.  Lipid panel next visit. 

## 2019-04-05 NOTE — Progress Notes (Signed)
BP 114/63   Pulse 86   Temp 98.4 F (36.9 C) (Temporal)   Ht 5\' 3"  (1.6 m)   Wt 254 lb (115.2 kg)   LMP  (LMP Unknown)   SpO2 98%   BMI 44.99 kg/m    Subjective:    Patient ID: [name], female    DOB: Oct 23, 1956, 63 y.o.   MRN: [id number]  HPI: [name] is a 63 y.o. female  Chief Complaint  Patient presents with  . Hypertension    68m f/u  . Hyperlipidemia  . Anxiety    . This visit was completed via telephone due to the restrictions of the COVID-19 pandemic. All issues as above were discussed and addressed but no physical exam was performed. If it was felt that the patient should be evaluated in the office, they were directed there. The patient verbally consented to this visit. Patient was unable to complete an audio/visual visit due to Lack of equipment. Due to the catastrophic nature of the COVID-19 pandemic, this visit was done through audio contact only. . Location of the patient: home . Location of the provider: work . Those involved with this call:  . Provider: Marnee Guarneri, DNP . CMA: Lesle Chris, [place] . Front Desk/Registration: Jill Side  . Time spent on call: 15 minutes on the phone discussing health concerns. 10 minutes total spent in review of patient's record and preparation of their chart. I verified patient identity using two factors (patient name and date of birth). Patient consents verbally to being seen via telemedicine visit today.   HYPERTENSION / HYPERLIPIDEMIA Continues on Telmisartan-HCTZ 40-12.5, Lasix, and Lipitor.  Satisfied with current treatment? yes Duration of hypertension: chronic BP monitoring frequency: daily BP range: 110-130/70-80's BP medication side effects: no Duration of hyperlipidemia: chronic Cholesterol medication side effects: no Cholesterol supplements: none Medication compliance: good compliance Aspirin: yes Recent stressors: no Recurrent headaches: no Visual changes: no Palpitations: no Dyspnea: no  Chest pain: no Lower extremity edema: no Dizzy/lightheaded: no   ANXIETY/STRESS Continues on Vistaril, Buspar, Wellbutrin, Cymbalta, and Soma.  Discussed a chronic care management referral with patient to assist in medication minimization, but she is not interested at this time.  Will continue to discuss at visits.  Duration:controlled Anxious mood: no  Excessive worrying: no Irritability: no  Sweating: no Nausea: no Palpitations:no Hyperventilation: no Panic attacks: no Agoraphobia: no  Obscessions/compulsions: no Depressed mood: no Depression screen [facility] 2/9 04/05/2019 12/30/2018 09/28/2018 06/28/2018 05/18/2018  Decreased Interest 0 0 0 1 0  Down, Depressed, Hopeless 0 0 0 1 0  PHQ - 2 Score 0 0 0 2 0  Altered sleeping 0 1 0 0 -  Tired, decreased energy 0 1 0 0 -  Change in appetite 0 - 0 0 -  Feeling bad or failure about yourself  0 0 0 0 -  Trouble concentrating 0 0 - 1 -  Moving slowly or fidgety/restless 0 0 0 0 -  Suicidal thoughts 0 0 0 0 -  PHQ-9 Score 0 2 0 3 -  Difficult doing work/chores - Not difficult at all - - -  Some recent data might be hidden   Anhedonia: no Weight changes: no Insomnia: yes hard to fall asleep  Hypersomnia: no Fatigue/loss of energy: no Feelings of worthlessness: no Feelings of guilt: no Impaired concentration/indecisiveness: no Suicidal ideations: no  Crying spells: no Recent Stressors/Life Changes: no   Relationship problems: no   Family stress: no     Financial  stress: no    Job stress: no    Recent death/loss: no GAD 7 : Generalized Anxiety Score 04/05/2019 12/30/2018 09/28/2018  Nervous, Anxious, on Edge 2 1 1   Control/stop worrying 0 0 0  Worry too much - different things 0 0 0  Trouble relaxing 0 0 0  Restless 0 0 0  Easily annoyed or irritable 0 0 0  Afraid - awful might happen 0 0 0  Total GAD 7 Score 2 1 1   Anxiety Difficulty - Not difficult at all -    Relevant past medical, surgical, family and social history  reviewed and updated as indicated. Interim medical history since our last visit reviewed. Allergies and medications reviewed and updated.  Review of Systems  Constitutional: Negative for activity change, appetite change, diaphoresis, fatigue and fever.  Respiratory: Negative for cough, chest tightness and shortness of breath.   Cardiovascular: Negative for chest pain, palpitations and leg swelling.  Gastrointestinal: Negative for abdominal distention, abdominal pain, constipation, diarrhea, nausea and vomiting.  Endocrine: Negative for cold intolerance, heat intolerance, polydipsia, polyphagia and polyuria.  Neurological: Negative for dizziness, syncope, weakness, light-headedness, numbness and headaches.  Psychiatric/Behavioral: Negative.     Per HPI unless specifically indicated above     Objective:    BP 114/63   Pulse 86   Temp 98.4 F (36.9 C) (Temporal)   Ht 5\' 3"  (1.6 m)   Wt 254 lb (115.2 kg)   LMP  (LMP Unknown)   SpO2 98%   BMI 44.99 kg/m   Wt Readings from Last 3 Encounters:  04/05/19 254 lb (115.2 kg)  01/17/19 254 lb (115.2 kg)  12/30/18 255 lb (115.7 kg)    Physical Exam  No physical exam due to phone only visit  Results for orders placed or performed during the hospital encounter of 01/17/19  Surgical pathology  Result Value Ref Range   SURGICAL PATHOLOGY      Surgical Pathology CASE: [id number] PATIENT: [name] Surgical Pathology Report     SPECIMEN SUBMITTED: A. Cecum polyp; cbx B. Colon polyps x3, descending; cold snare  CLINICAL HISTORY: None provided  PRE-OPERATIVE DIAGNOSIS: Screening colonoscopy  POST-OPERATIVE DIAGNOSIS: Colon polyps, diverticulosis     DIAGNOSIS: A.  COLON POLYP, CECUM; COLD BIOPSY: - BENIGN COLONIC MUCOSA WITH SUPERFICIAL HYPERPLASTIC CHANGES. - NEGATIVE FOR DYSPLASIA AND MALIGNANCY.  Comment: Multiple additional deeper recut levels were examined.  B. COLON POLYPS X3, DESCENDING; COLD SNARE: -  FRAGMENTS (X2) OF TUBULAR ADENOMAS. - SINGLE FRAGMENT OF BENIGN COLONIC MUCOSA WITH NO SIGNIFICANT HISTOPATHOLOGIC CHANGE. - NEGATIVE FOR HIGH-GRADE DYSPLASIA AND MALIGNANCY.  Comment: Multiple additional deeper recut levels were examined.   GROSS DESCRIPTION: A. Labeled: C BX cecal polyp Received: Formalin Tissue fragment(s): 1 Size: 0.4 cm Description: Tan soft tissue fr agment Entirely submitted in 1 cassette.  B. Labeled: Cold snare descending colon polyp x3 Received: Formalin Tissue fragment(s): Multiple Size: Aggregate, 1.5 x 0.3 x 0.1 cm Description: Tan soft tissue fragments admixed with fecal material Entirely submitted in 1 cassette.   Final Diagnosis performed by Allena Napoleon, MD.   Electronically signed 01/18/2019 1:39:38PM The electronic signature indicates that the named Attending Pathologist has evaluated the specimen  Technical component performed at [facility], [address] [place], [place] [address] Lab: [phone] Dir: Rush Farmer, MD, MMM  Professional component performed at [facility], [facility], [place], [place], [place] [address] Lab: [phone] Dir: Dellia Nims. Reuel Derby, MD       Assessment & Plan:   Problem  List Items Addressed This Visit      Cardiovascular and Mediastinum   Benign hypertension    Chronic, ongoing.  BP below goal at home.  Continue current medication regimen.        Relevant Medications   furosemide (LASIX) 20 MG tablet     Respiratory   COPD, severe (HCC)   Relevant Medications   umeclidinium bromide (INCRUSE ELLIPTA) 62.5 MCG/INH AEPB     Other   Anxiety    Chronic, ongoing.  Continue current medication regimen.   Refills sent.  Labs next visit.      Relevant Medications   busPIRone (BUSPAR) 10 MG tablet   DULoxetine (CYMBALTA) 60 MG capsule   amitriptyline (ELAVIL) 25 MG tablet   Depression, major, single episode, in partial remission ([place]) - Primary    Chronic, ongoing.  Continue  current medication regimen.  Encourage medication minimization.      Relevant Medications   busPIRone (BUSPAR) 10 MG tablet   DULoxetine (CYMBALTA) 60 MG capsule   amitriptyline (ELAVIL) 25 MG tablet   Hyperlipidemia    Chronic, ongoing.  Continue current medication regimen.  Lipid panel next visit.      Relevant Medications   furosemide (LASIX) 20 MG tablet      I discussed the assessment and treatment plan with the patient. The patient was provided an opportunity to ask questions and all were answered. The patient agreed with the plan and demonstrated an understanding of the instructions.   The patient was advised to call back or seek an in-person evaluation if the symptoms worsen or if the condition fails to improve as anticipated.   I provided 15 minutes of time during this encounter.  Follow up plan: Return in about 2 months (around 06/05/2019) for Mood, HTN/HLD, Weight loss (metformin) -- needs labs.

## 2019-04-05 NOTE — Assessment & Plan Note (Signed)
Chronic, ongoing.  Continue current medication regimen.   Refills sent.  Labs next visit.

## 2019-04-05 NOTE — Assessment & Plan Note (Signed)
Chronic, ongoing.  Continue current medication regimen.  Encourage medication minimization.

## 2019-04-05 NOTE — Assessment & Plan Note (Signed)
Chronic, ongoing.  BP below goal at home.  Continue current medication regimen.

## 2019-04-14 ENCOUNTER — Other Ambulatory Visit: Payer: Self-pay | Admitting: Nurse Practitioner

## 2019-04-23 ENCOUNTER — Other Ambulatory Visit: Payer: Self-pay | Admitting: Nurse Practitioner

## 2019-04-24 ENCOUNTER — Other Ambulatory Visit: Payer: Self-pay | Admitting: Nurse Practitioner

## 2019-06-01 ENCOUNTER — Other Ambulatory Visit: Payer: Self-pay | Admitting: Nurse Practitioner

## 2019-06-01 NOTE — Telephone Encounter (Signed)
Forwarding medication refills to PCP for review. 

## 2019-06-05 ENCOUNTER — Other Ambulatory Visit: Payer: Self-pay | Admitting: Nurse Practitioner

## 2019-06-06 ENCOUNTER — Other Ambulatory Visit: Payer: Self-pay | Admitting: Nurse Practitioner

## 2019-06-06 MED ORDER — HYDROXYZINE HCL 10 MG PO TABS: 10.0000 mg | ORAL_TABLET | Freq: Three times a day (TID) | ORAL | 2 refills | Status: DC | PRN

## 2019-06-06 NOTE — Telephone Encounter (Signed)
RX is written for TID. Please advise.

## 2019-07-17 ENCOUNTER — Other Ambulatory Visit: Payer: Self-pay | Admitting: Nurse Practitioner

## 2019-07-17 NOTE — Telephone Encounter (Signed)
Please advise refill could not be delegated

## 2019-07-20 ENCOUNTER — Other Ambulatory Visit: Payer: Self-pay | Admitting: Nurse Practitioner

## 2019-07-24 ENCOUNTER — Other Ambulatory Visit: Payer: Self-pay | Admitting: Nurse Practitioner

## 2019-07-24 MED ORDER — TELMISARTAN-HCTZ 40-12.5 MG PO TABS: 1.0000 | ORAL_TABLET | Freq: Every day | ORAL | 3 refills | Status: DC

## 2019-08-20 ENCOUNTER — Other Ambulatory Visit: Payer: Self-pay | Admitting: Nurse Practitioner

## 2019-08-21 ENCOUNTER — Other Ambulatory Visit: Payer: Self-pay | Admitting: Nurse Practitioner

## 2019-08-21 NOTE — Telephone Encounter (Signed)
Routing to provider  

## 2019-08-21 NOTE — Telephone Encounter (Signed)
Requested medication (s) are due for refill today: yes  Requested medication (s) are on the active medication list: yes  Last refill:  07/19/2019  Future visit scheduled:no  Notes to clinic:  Review for refill   Requested Prescriptions  Pending Prescriptions Disp Refills   carisoprodol (SOMA) 350 MG tablet [Pharmacy Med Name: Carisoprodol 350 MG Oral Tablet] 90 tablet 0    Sig: TAKE 1 TABLET BY MOUTH TWO TO THREE TIMES DAILY AS NEEDED IF TOLERATED. NEED AN APPOINTMENT FOR FURTHER REFILLS.     Not Delegated - Analgesics:  Muscle Relaxants Failed - 08/20/2019  1:35 AM      Failed - This refill cannot be delegated      Passed - Valid encounter within last 6 months    Recent Outpatient Visits          4 months ago Depression, major, single episode, in partial remission ([place])   [place], Trona T, NP   7 months ago COPD, severe ([place])   [place], Jolene T, NP   10 months ago Benign hypertension   [place], [place] T, NP   1 year ago COPD, severe [facility])   [place], [place], PA-C   1 year ago Leukocytosis, unspecified type   [facility] [name], NP

## 2019-08-21 NOTE — Telephone Encounter (Signed)
Seen in May, needs 6 month follow-up scheduled please.

## 2019-08-21 NOTE — Telephone Encounter (Signed)
Requested medication (s) are due for refill today: yes   Requested medication (s) are on the active medication list: yes  Last refill:  07/10/2019  Future visit scheduled: no  Notes to clinic:  Overdue for office visit    Requested Prescriptions  Pending Prescriptions Disp Refills   amitriptyline (ELAVIL) 25 MG tablet [Pharmacy Med Name: Amitriptyline HCl 25 MG Oral Tablet] 60 tablet 0    Sig: TAKE 2 TABLETS BY MOUTH AT BEDTIME     Psychiatry:  Antidepressants - Heterocyclics (TCAs) Passed - 08/21/2019 12:24 AM      Passed - Valid encounter within last 6 months    Recent Outpatient Visits          4 months ago Depression, major, single episode, in partial remission ([place])   [place], Taylor T, NP   7 months ago COPD, severe ([place])   [place], Jolene T, NP   10 months ago Benign hypertension   [place], Nunda T, NP   1 year ago COPD, severe [facility])   [place] Trinna Post, PA-C   1 year ago Leukocytosis, unspecified type   [place], NP             Passed - Completed PHQ-2 or PHQ-9 in the last 360 days.

## 2019-08-21 NOTE — Telephone Encounter (Signed)
Called patient, no answer, went to voicemail, during voicemail it went out, unable to leave a message, will try again.

## 2019-08-29 NOTE — Telephone Encounter (Signed)
Called and left a detailed message for patient to call and schedule a 6 month follow up.

## 2019-09-04 ENCOUNTER — Encounter: Payer: Self-pay | Admitting: Nurse Practitioner

## 2019-09-04 ENCOUNTER — Other Ambulatory Visit: Payer: Self-pay

## 2019-09-04 ENCOUNTER — Ambulatory Visit (INDEPENDENT_AMBULATORY_CARE_PROVIDER_SITE_OTHER): Payer: BC Managed Care – PPO | Admitting: Nurse Practitioner

## 2019-09-04 VITALS — BP 121/73 | HR 84 | Temp 98.7°F

## 2019-09-04 DIAGNOSIS — J449 Chronic obstructive pulmonary disease, unspecified: Secondary | ICD-10-CM | POA: Diagnosis not present

## 2019-09-04 DIAGNOSIS — I10 Essential (primary) hypertension: Secondary | ICD-10-CM

## 2019-09-04 DIAGNOSIS — R7301 Impaired fasting glucose: Secondary | ICD-10-CM

## 2019-09-04 DIAGNOSIS — F324 Major depressive disorder, single episode, in partial remission: Secondary | ICD-10-CM

## 2019-09-04 DIAGNOSIS — R635 Abnormal weight gain: Secondary | ICD-10-CM

## 2019-09-04 DIAGNOSIS — F17210 Nicotine dependence, cigarettes, uncomplicated: Secondary | ICD-10-CM

## 2019-09-04 DIAGNOSIS — Z23 Encounter for immunization: Secondary | ICD-10-CM

## 2019-09-04 DIAGNOSIS — R7303 Prediabetes: Secondary | ICD-10-CM | POA: Insufficient documentation

## 2019-09-04 DIAGNOSIS — Z6841 Body Mass Index (BMI) 40.0 and over, adult: Secondary | ICD-10-CM

## 2019-09-04 DIAGNOSIS — K2100 Gastro-esophageal reflux disease with esophagitis, without bleeding: Secondary | ICD-10-CM

## 2019-09-04 DIAGNOSIS — G4730 Sleep apnea, unspecified: Secondary | ICD-10-CM

## 2019-09-04 DIAGNOSIS — E559 Vitamin D deficiency, unspecified: Secondary | ICD-10-CM

## 2019-09-04 DIAGNOSIS — E66813 Obesity, class 3: Secondary | ICD-10-CM

## 2019-09-04 DIAGNOSIS — E782 Mixed hyperlipidemia: Secondary | ICD-10-CM

## 2019-09-04 MED ORDER — ATORVASTATIN CALCIUM 40 MG PO TABS: 40.0000 mg | ORAL_TABLET | Freq: Every day | ORAL | 3 refills | Status: DC

## 2019-09-04 MED ORDER — FUROSEMIDE 20 MG PO TABS: 20.0000 mg | ORAL_TABLET | Freq: Every day | ORAL | 3 refills | Status: DC

## 2019-09-04 MED ORDER — OMEPRAZOLE 40 MG PO CPDR: 40.0000 mg | DELAYED_RELEASE_CAPSULE | Freq: Every day | ORAL | 3 refills | Status: DC

## 2019-09-04 MED ORDER — HYDROXYZINE HCL 10 MG PO TABS: 10.0000 mg | ORAL_TABLET | Freq: Three times a day (TID) | ORAL | 3 refills | Status: DC | PRN

## 2019-09-04 NOTE — Assessment & Plan Note (Signed)
Recommend continued focus on health diet choices and regular physical activity (30 minutes 5 days a week). 

## 2019-09-04 NOTE — Assessment & Plan Note (Signed)
Chronic, ongoing.  Continue Prilosec.  Attempt reduction in upcoming months.  Obtain Mag level today.

## 2019-09-04 NOTE — Assessment & Plan Note (Signed)
Continue to wear CPAP every night.

## 2019-09-04 NOTE — Assessment & Plan Note (Signed)
Continue supplement and check Vit D level today.

## 2019-09-04 NOTE — Progress Notes (Signed)
BP 121/73   Pulse 84 Comment: apical  Temp 98.7 F (37.1 C) (Oral)   LMP  (LMP Unknown)   SpO2 97%    Subjective:    Patient ID: [name], female    DOB: August 15, 1956, 63 y.o.   MRN: [id number]  HPI: [name] is a 63 y.o. female  Chief Complaint  Patient presents with  . Depression  . Diabetes  . Hyperlipidemia  . Hypertension   HYPERTENSION / HYPERLIPIDEMIA Continues on Telmisartan-HCTZ 40-12.5, Lasix, K+, and Lipitor. Has been taking Metformin over past year to assist with weight loss and prediabetes, A1C over past years 5.7 to 5.9.   Satisfied with current treatment? yes Duration of hypertension: chronic BP monitoring frequency: not checking BP range:  BP medication side effects: no Duration of hyperlipidemia: chronic Cholesterol medication side effects: no Cholesterol supplements: none Medication compliance: good compliance Aspirin: yes Recent stressors: no Recurrent headaches: no Visual changes: no Palpitations: no Dyspnea: no Chest pain: no Lower extremity edema: no Dizzy/lightheaded: no   DEPRESSION Continues on Amitriptyline, Wellbutrin, Cymbalta, and Soma.  Reports wishes to come off Soma, as she wishes to focus on pain control more and return to pain clinic.  States she was told when she last did go to a pain clinic they could not treat her due to [place] use.  Has been on Soma for a long while. Mood status: stable Satisfied with current treatment?: yes Symptom severity: moderate  Duration of current treatment : chronic Side effects: no Medication compliance: good compliance Psychotherapy/counseling: none Depressed mood: sometimes Anxious mood: no Anhedonia: no Significant weight loss or gain: no Insomnia: yes hard to stay asleep Fatigue: no Feelings of worthlessness or guilt: no Impaired concentration/indecisiveness: no Suicidal ideations: no Hopelessness: no Crying spells: no Depression screen [facility] 2/9 04/05/2019 12/30/2018 09/28/2018  06/28/2018 05/18/2018  Decreased Interest 0 0 0 1 0  Down, Depressed, Hopeless 0 0 0 1 0  PHQ - 2 Score 0 0 0 2 0  Altered sleeping 0 1 0 0 -  Tired, decreased energy 0 1 0 0 -  Change in appetite 0 - 0 0 -  Feeling bad or failure about yourself  0 0 0 0 -  Trouble concentrating 0 0 - 1 -  Moving slowly or fidgety/restless 0 0 0 0 -  Suicidal thoughts 0 0 0 0 -  PHQ-9 Score 0 2 0 3 -  Difficult doing work/chores - Not difficult at all - - -  Some recent data might be hidden   COPD Continues to smoke one pack per day.  She reports "I need to quit", but is not interested.  Does use CPAP about 90% of the time with OSA. COPD status: stable Satisfied with current treatment?: yes Oxygen use: no Dyspnea frequency: improved with Incruse Cough frequency:  Rescue inhaler frequency:   Limitation of activity: at times Productive cough:  Last Spirometry: unknown Pneumovax: Up to Date Influenza: Up to Date  GERD Continues on Prilosec and reports good control. GERD control status: stable  Satisfied with current treatment? yes Heartburn frequency: none Medication side effects: no  Medication compliance: stable Dysphagia: no Odynophagia:  no Hematemesis: no Blood in stool: no EGD: no  VITAMIN D DEFICIENCY: Continues on daily supplement without issues.  No recent falls or fractures.  Relevant past medical, surgical, family and social history reviewed and updated as indicated. Interim medical history since our last visit reviewed. Allergies and medications reviewed and updated.  Review of  Systems  Constitutional: Negative for activity change, appetite change, diaphoresis, fatigue and fever.  Respiratory: Negative for cough, chest tightness and shortness of breath.   Cardiovascular: Negative for chest pain, palpitations and leg swelling.  Gastrointestinal: Negative for abdominal distention, abdominal pain, constipation, diarrhea, nausea and vomiting.  Endocrine: Negative for cold  intolerance, heat intolerance, polydipsia, polyphagia and polyuria.  Neurological: Negative for dizziness, syncope, weakness, light-headedness, numbness and headaches.  Psychiatric/Behavioral: Negative.     Per HPI unless specifically indicated above     Objective:    BP 121/73   Pulse 84 Comment: apical  Temp 98.7 F (37.1 C) (Oral)   LMP  (LMP Unknown)   SpO2 97%   Wt Readings from Last 3 Encounters:  04/05/19 254 lb (115.2 kg)  01/17/19 254 lb (115.2 kg)  12/30/18 255 lb (115.7 kg)    Physical Exam Vitals signs and nursing note reviewed.  Constitutional:      General: She is awake. She is not in acute distress.    Appearance: She is well-developed. She is obese. She is not ill-appearing.  HENT:     Head: Normocephalic.     Right Ear: Hearing normal.     Left Ear: Hearing normal.  Eyes:     General: Lids are normal.        Right eye: No discharge.        Left eye: No discharge.     Conjunctiva/sclera: Conjunctivae normal.     Pupils: Pupils are equal, round, and reactive to light.  Neck:     Musculoskeletal: Normal range of motion and neck supple.     Vascular: No carotid bruit.  Cardiovascular:     Rate and Rhythm: Normal rate and regular rhythm.     Heart sounds: Normal heart sounds. No murmur. No gallop.   Pulmonary:     Effort: Pulmonary effort is normal. No accessory muscle usage or respiratory distress.     Breath sounds: Normal breath sounds.  Abdominal:     General: Bowel sounds are normal.     Palpations: Abdomen is soft.  Musculoskeletal:     Right lower leg: No edema.     Left lower leg: No edema.  Skin:    General: Skin is warm and dry.  Neurological:     Mental Status: She is alert and oriented to person, place, and time.  Psychiatric:        Attention and Perception: Attention normal.        Mood and Affect: Mood normal.        Behavior: Behavior normal. Behavior is cooperative.        Thought Content: Thought content normal.         Judgment: Judgment normal.     Results for orders placed or performed during the hospital encounter of 01/17/19  Surgical pathology  Result Value Ref Range   SURGICAL PATHOLOGY      Surgical Pathology CASE: [id number] PATIENT: Wallace Keller Surgical Pathology Report     SPECIMEN SUBMITTED: A. Cecum polyp; cbx B. Colon polyps x3, descending; cold snare  CLINICAL HISTORY: None provided  PRE-OPERATIVE DIAGNOSIS: Screening colonoscopy  POST-OPERATIVE DIAGNOSIS: Colon polyps, diverticulosis     DIAGNOSIS: A.  COLON POLYP, CECUM; COLD BIOPSY: - BENIGN COLONIC MUCOSA WITH SUPERFICIAL HYPERPLASTIC CHANGES. - NEGATIVE FOR DYSPLASIA AND MALIGNANCY.  Comment: Multiple additional deeper recut levels were examined.  B. COLON POLYPS X3, DESCENDING; COLD SNARE: - FRAGMENTS (X2) OF TUBULAR ADENOMAS. - SINGLE FRAGMENT OF BENIGN COLONIC  MUCOSA WITH NO SIGNIFICANT HISTOPATHOLOGIC CHANGE. - NEGATIVE FOR HIGH-GRADE DYSPLASIA AND MALIGNANCY.  Comment: Multiple additional deeper recut levels were examined.   GROSS DESCRIPTION: A. Labeled: C BX cecal polyp Received: Formalin Tissue fragment(s): 1 Size: 0.4 cm Description: Tan soft tissue fr agment Entirely submitted in 1 cassette.  B. Labeled: Cold snare descending colon polyp x3 Received: Formalin Tissue fragment(s): Multiple Size: Aggregate, 1.5 x 0.3 x 0.1 cm Description: Tan soft tissue fragments admixed with fecal material Entirely submitted in 1 cassette.   Final Diagnosis performed by Allena Napoleon, MD.   Electronically signed 01/18/2019 1:39:38PM The electronic signature indicates that the named Attending Pathologist has evaluated the specimen  Technical component performed at [facility], [address], [place], [place] [address] Lab: [phone] Dir: Rush Farmer, MD, MMM  Professional component performed at [facility], [facility], [place], [place], [place] [address] Lab: [phone]  Dir: Dellia Nims. Reuel Derby, MD       Assessment & Plan:   Problem List Items Addressed This Visit      Cardiovascular and Mediastinum   Benign hypertension    Chronic, ongoing.  BP below goal today.  Continue current medication regimen and adjust as needed.  CCM referral for polypharmacy.        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  Continue current medication regimen.  Not O2 dependent at this time.  Continue to encourage smoking cessation.      Sleep apnea    Continue to wear CPAP every night.        Digestive   Gastroesophageal reflux disease    Chronic, ongoing.  Continue Prilosec.  Attempt reduction in upcoming months.  Obtain Mag level today.      Relevant Medications   omeprazole (PRILOSEC) 40 MG capsule   Other Relevant Orders   Magnesium     Endocrine   IFG (impaired fasting glucose)    Check A1C today.      Relevant Orders   HgB A1c     Other   Depression, major, single episode, in partial remission (HCC)    Chronic, ongoing.  Continue current medication regimen.  Encourage medication minimization. Denies SI/HI.  CCM referral to assist with med minimization.      Relevant Medications   hydrOXYzine (ATARAX/VISTARIL) 10 MG tablet   Hyperlipidemia    Chronic, ongoing.  Continue statin and adjust as needed.  Labs today.      Relevant Medications   atorvastatin (LIPITOR) 40 MG tablet   furosemide (LASIX) 20 MG tablet   Other Relevant Orders   Comprehensive metabolic panel   Lipid Panel w/o Chol/HDL Ratio   Referral to Chronic Care Management Services   Obesity    Recommend continued focus on health diet choices and regular physical activity (30 minutes 5 days a week).       Nicotine dependence, cigarettes, uncomplicated    I have recommended complete  cessation of tobacco use. I have discussed various options available for assistance with tobacco cessation including over the counter methods (Nicotine gum, patch and lozenges). We also discussed prescription options (Chantix, Nicotine Inhaler / Nasal Spray). The patient is not interested in pursuing any prescription tobacco cessation options at this time.       Abnormal weight gain  Reports continued weight loss, however appears to be maintaining.  Check kidney function today with ongoing Metformin use, noted to have prediabetes on past labs.      Vitamin D deficiency    Continue supplement and check Vit D level today.      Relevant Orders   VITAMIN D 25 Hydroxy (Vit-D Deficiency, Fractures)    Other Visit Diagnoses    Need for influenza vaccination       Relevant Orders   Flu Vaccine QUAD 36+ mos IM (Completed)       Follow up plan: Return in about 6 months (around 03/04/2020) for HTN/HLD, Prediabetes, Mood, COPD.Pip;;

## 2019-09-04 NOTE — Patient Instructions (Signed)

## 2019-09-04 NOTE — Assessment & Plan Note (Signed)
I have recommended complete cessation of tobacco use. I have discussed various options available for assistance with tobacco cessation including over the counter methods (Nicotine gum, patch and lozenges). We also discussed prescription options (Chantix, Nicotine Inhaler / Nasal Spray). The patient is not interested in pursuing any prescription tobacco cessation options at this time.  

## 2019-09-04 NOTE — Assessment & Plan Note (Signed)
Chronic, ongoing.  Continue current medication regimen.  Encourage medication minimization. Denies SI/HI.  CCM referral to assist with med minimization.

## 2019-09-04 NOTE — Assessment & Plan Note (Signed)
Chronic, ongoing.  Continue statin and adjust as needed.  Labs today.

## 2019-09-04 NOTE — Assessment & Plan Note (Signed)
Chronic, ongoing.  BP below goal today.  Continue current medication regimen and adjust as needed.  CCM referral for polypharmacy.

## 2019-09-04 NOTE — Assessment & Plan Note (Signed)
Reports continued weight loss, however appears to be maintaining.  Check kidney function today with ongoing Metformin use, noted to have prediabetes on past labs.

## 2019-09-04 NOTE — Assessment & Plan Note (Signed)
Check A1C today

## 2019-09-04 NOTE — Assessment & Plan Note (Signed)
Chronic, ongoing.  Continue current medication regimen.  Not O2 dependent at this time.  Continue to encourage smoking cessation.

## 2019-09-05 ENCOUNTER — Encounter: Payer: Self-pay | Admitting: Nurse Practitioner

## 2019-09-05 LAB — COMPREHENSIVE METABOLIC PANEL
ALT: 20 IU/L (ref 0–32)
AST: 23 IU/L (ref 0–40)
Albumin/Globulin Ratio: 1.5 (ref 1.2–2.2)
Albumin: 4.1 g/dL (ref 3.8–4.8)
Alkaline Phosphatase: 130 IU/L — ABNORMAL HIGH (ref 39–117)
BUN/Creatinine Ratio: 14 (ref 12–28)
BUN: 16 mg/dL (ref 8–27)
Bilirubin Total: <0.2 mg/dL (ref 0.0–1.2)
CO2: 22 mmol/L (ref 20–29)
Calcium: 9.5 mg/dL (ref 8.7–10.3)
Chloride: 105 mmol/L (ref 96–106)
Creatinine, Ser: 1.12 mg/dL — ABNORMAL HIGH (ref 0.57–1.00)
GFR calc Af Amer: 60 mL/min/{1.73_m2} (ref >59)
GFR calc non Af Amer: 52 mL/min/{1.73_m2} — ABNORMAL LOW (ref >59)
Globulin, Total: 2.8 g/dL (ref 1.5–4.5)
Glucose: 94 mg/dL (ref 65–99)
Potassium: 5.1 mmol/L (ref 3.5–5.2)
Sodium: 140 mmol/L (ref 134–144)
Total Protein: 6.9 g/dL (ref 6.0–8.5)

## 2019-09-05 LAB — LIPID PANEL W/O CHOL/HDL RATIO
Cholesterol, Total: 175 mg/dL (ref 100–199)
HDL: 61 mg/dL (ref >39)
LDL Chol Calc (NIH): 91 mg/dL (ref 0–99)
Triglycerides: 131 mg/dL (ref 0–149)
VLDL Cholesterol Cal: 23 mg/dL (ref 5–40)

## 2019-09-05 LAB — HEMOGLOBIN A1C
Est. average glucose Bld gHb Est-mCnc: 126 mg/dL
Hgb A1c MFr Bld: 6 % — ABNORMAL HIGH (ref 4.8–5.6)

## 2019-09-05 LAB — VITAMIN D 25 HYDROXY (VIT D DEFICIENCY, FRACTURES): Vit D, 25-Hydroxy: 44.7 ng/mL (ref 30.0–100.0)

## 2019-09-05 LAB — MAGNESIUM: Magnesium: 1.7 mg/dL (ref 1.6–2.3)

## 2019-09-18 ENCOUNTER — Other Ambulatory Visit: Payer: Self-pay | Admitting: Nurse Practitioner

## 2019-09-20 ENCOUNTER — Telehealth: Payer: Self-pay | Admitting: *Deleted

## 2019-09-20 ENCOUNTER — Encounter: Payer: Self-pay | Admitting: *Deleted

## 2019-09-20 NOTE — Telephone Encounter (Signed)
Left message for patient to notify them that it is time to schedule annual low dose lung cancer screening CT scan. Instructed patient to call back to verify information prior to the scan being scheduled.  

## 2019-09-21 ENCOUNTER — Telehealth: Payer: Self-pay | Admitting: *Deleted

## 2019-09-21 DIAGNOSIS — Z122 Encounter for screening for malignant neoplasm of respiratory organs: Secondary | ICD-10-CM

## 2019-09-21 DIAGNOSIS — Z87891 Personal history of nicotine dependence: Secondary | ICD-10-CM

## 2019-09-21 NOTE — Telephone Encounter (Signed)
Patient has been notified that annual lung cancer screening low dose CT scan is due currently or will be in near future. Confirmed that patient is within the age range of 55-77, and asymptomatic, (no signs or symptoms of lung cancer). Patient denies illness that would prevent curative treatment for lung cancer if found. Verified smoking history, (current, 49 pack year). The shared decision making visit was done 09/01/16. Patient is agreeable for CT scan being scheduled.

## 2019-09-26 ENCOUNTER — Ambulatory Visit: Payer: Self-pay | Admitting: Pharmacist

## 2019-09-26 ENCOUNTER — Other Ambulatory Visit: Payer: Self-pay | Admitting: Nurse Practitioner

## 2019-09-26 DIAGNOSIS — M5136 Other intervertebral disc degeneration, lumbar region: Secondary | ICD-10-CM

## 2019-09-26 DIAGNOSIS — M48062 Spinal stenosis, lumbar region with neurogenic claudication: Secondary | ICD-10-CM

## 2019-09-26 DIAGNOSIS — Z6841 Body Mass Index (BMI) 40.0 and over, adult: Secondary | ICD-10-CM

## 2019-09-26 DIAGNOSIS — G8929 Other chronic pain: Secondary | ICD-10-CM

## 2019-09-26 DIAGNOSIS — M51369 Other intervertebral disc degeneration, lumbar region without mention of lumbar back pain or lower extremity pain: Secondary | ICD-10-CM

## 2019-09-26 NOTE — Patient Instructions (Signed)
Visit Information  Goals Addressed            This Visit's Progress     Patient Stated   . PharmD "I am in a lot of pain" (pt-stated)       Current Barriers:  . Polypharmacy; complex patient with multiple comorbidities including pre-diabetes, chronic pain, depression, COPD, tobacco abuse o Chronic pain - reports lower back, hip, shoulder. Previously followed w/ Dr. Holley Raring pain management, last seen 04/2018. Notes she would prefer to see another provider.  - Self titrated and discontinued carisoprodol and amitriptyline. Notes no change in pain or sleep with discontinuation - Current medication regimen: duloxetine 60 mg daily, gabapentin 800 mg TID, naproxen PRN excrutiating pain; notes that ibuprofen impacted kidney function previously - Denies any benefit from lidoderm patches or Voltaren gel before  o Depression/anxiety: duloxetine 60 mg daily, bupropion XL 300 mg daily, buspirone 10 mg TID, hydroxyzine PRN; notes concerns with insomnia, but believes this to be most related to lack of pain control o Pre-diabetes: metformin 500 mg TID; last A1c 6%; notes that weight loss is something she would like to work on. Generally doesn't eat breakfast or lunch because she isn't hungry, but will then get hungry in the evening and overeat at supper time o Acid reflux: patient notes she was on omeprazole but insurance stopped covering; is planning on calling to see which PPI is preferred. Currently taking OTC famotidine 10 mg BID  o HTN: telmisartan/HCTZ 40/12.5 mg QAM; BP well controlled o COPD: Incruse daily + albuterol HFA PRN; notes infrequent use of albuterol recently  Pharmacist Clinical Goal(s):  Marland Kitchen Over the next 90 days, patient will work with PharmD and provider towards optimized medication management  Interventions: . Comprehensive medication review performed; medication list updated in electronic medical record . Discussed referral to pain management- patient amenable to this. Collaborating  with Marnee Guarneri for this referral. Recommended she not self-adjust any of her medications for pain until she meets with pain management. She verbalized understanding . Discussed concerns with weight. GLP1 therapy was discussed; discussed that we could consider something like Saxenda or off-label Victoza/Ozempic. Appetite suppressant effect may provide benefit in patient's "binge" eating at supper . Also recommended choosing to eat a late afternoon snack high in protein (cheese, greek yogurt) so that she isn't so hungry at supper time. She notes she would try this.  Patient Self Care Activities:  . Patient will take medications as prescribed  Initial goal documentation        The patient verbalized understanding of instructions provided today and declined a print copy of patient instruction materials.   Plan: - Will collaborate with Marnee Guarneri, NP on above - Will outreach patient in the next 3-5 weeks for continued medication management support   Catie Darnelle Maffucci, PharmD Clinical Pharmacist [place] [phone]

## 2019-09-26 NOTE — Chronic Care Management (AMB) (Signed)
Chronic Care Management   Note  09/26/2019 Name: [name] MRN: [id number] DOB: 02/04/1956   Subjective:  [name] is a 63 y.o. year old female who is a primary care patient of Cannady, Barbaraann Faster, NP. The CCM team was consulted for assistance with chronic disease management and care coordination needs.    Contacted patient regarding medication management needs today.  Review of patient status, including review of consultants reports, laboratory and other test data, was performed as part of comprehensive evaluation and provision of chronic care management services.   Objective:  Lab Results  Component Value Date   CREATININE 1.12 (H) 09/04/2019   CREATININE 0.91 12/30/2018   CREATININE 0.97 07/28/2018    Lab Results  Component Value Date   HGBA1C 6.0 (H) 09/04/2019       Component Value Date/Time   CHOL 175 09/04/2019 1635   CHOL 186 12/01/2016 1405   TRIG 131 09/04/2019 1635   TRIG 177 (H) 12/01/2016 1405   HDL 61 09/04/2019 1635   VLDL 35 (H) 12/01/2016 1405   LDLCALC 91 09/04/2019 1635    Clinical ASCVD: No  The 10-year ASCVD risk score Mikey Bussing DC Jr., et al., 2013) is: 9.1%   Values used to calculate the score:     Age: 81 years     Sex: Female     Is Non-Hispanic African American: No     Diabetic: No     Tobacco smoker: Yes     Systolic Blood Pressure: 123XX123 mmHg     Is BP treated: Yes     HDL Cholesterol: 61 mg/dL     Total Cholesterol: 175 mg/dL    BP Readings from Last 3 Encounters:  09/04/19 121/73  04/05/19 114/63  01/17/19 129/75    No Known Allergies  Medications Reviewed Today    Reviewed by De Hollingshead, [facility] (Pharmacist) on 09/26/19 at [place] List Status: <None>  Medication Order Taking? Sig Documenting Provider Last Dose Status Informant  albuterol (PROAIR HFA) 108 (90 Base) MCG/ACT inhaler [id number] No Inhale 2 puffs into the lungs every 6 (six) hours as needed for wheezing or shortness of breath.  Patient not taking:  Reported on 09/26/2019   [name], NP Not Taking Active        Patient not taking:      Discontinued 09/26/19 1538 (Patient Preference)   aspirin 81 MG tablet [id number] Yes Take 81 mg by mouth daily. [provider] Taking Active         Discontinued 06/02/19 1402 (Reorder)   atorvastatin (LIPITOR) 40 MG tablet [id number] Yes Take 1 tablet (40 mg total) by mouth daily. Marnee Guarneri T, NP Taking Active   buPROPion (WELLBUTRIN XL) 300 MG 24 hr tablet [id number] Yes Take 1 tablet by mouth once daily Cannady, Jolene T, NP Taking Active   busPIRone (BUSPAR) 10 MG tablet [id number] Yes Take 1 tablet (10 mg total) by mouth 3 (three) times daily. Marnee Guarneri T, NP Taking Active   Calcium Citrate-Vitamin D (CALCIUM + D PO) [id number] Yes Take 1,000 mg by mouth daily. [provider] Taking Active        Patient not taking:      Discontinued 09/26/19 1538 (Patient Preference)   DULoxetine (CYMBALTA) 60 MG capsule [id number] Yes Take 1 capsule (60 mg total) by mouth daily. Marnee Guarneri T, NP Taking Active   famotidine (PEPCID) 10 MG tablet [id number] Yes Take 10 mg by mouth 2 (two) times daily. [provider] Taking Active   furosemide (LASIX) 20 MG tablet [id number] Yes Take 1 tablet (20 mg total) by mouth daily. Marnee Guarneri T, NP Taking Active   gabapentin (NEURONTIN) 800 MG tablet [id number] Yes Take 1 tablet (800 mg total) by mouth 3 (three) times daily. Marnee Guarneri T, NP Taking Active   hydrOXYzine (ATARAX/VISTARIL) 10 MG tablet [id number] Yes Take 1 tablet (10 mg total) by mouth 3 (three) times daily as needed. Marnee Guarneri T, NP Taking Active   metFORMIN (GLUCOPHAGE) 500 MG tablet [id number] Yes Take 1 tablet (500 mg total) by mouth 3 (three) times daily with meals. Marnee Guarneri T, NP Taking Active   Multiple Vitamins-Minerals (MULTIVITAMIN WITH MINERALS) tablet [id number] Yes Take 1 tablet by mouth daily. [provider] Taking Active   naproxen  (NAPROSYN) 500 MG tablet [id number] No Take 500 mg by mouth 2 (two) times daily with a meal. [provider] Not Taking Active         Discontinued 06/02/19 1402 (Reorder)   omeprazole (PRILOSEC) 40 MG capsule [id number] Yes Take 1 capsule (40 mg total) by mouth daily. Marnee Guarneri T, NP Taking Active   potassium chloride SA (K-DUR) 20 MEQ tablet [id number] Yes TAKE 1  BY MOUTH ONCE DAILY Marnee Guarneri T, NP Taking Active         Discontinued 06/02/19 1402 (Reorder)   telmisartan-hydrochlorothiazide (MICARDIS HCT) 40-12.5 MG tablet [id number] Yes Take 1 tablet by mouth daily. Marnee Guarneri T, NP Taking Active   umeclidinium bromide (INCRUSE ELLIPTA) 62.5 MCG/INH AEPB [id number] Yes Inhale 1 puff into the lungs daily. Marnee Guarneri T, NP Taking Active   vitamin B-12 (CYANOCOBALAMIN) 1000 MCG tablet [id number] Yes Take 1,000 mcg by mouth daily. [provider] Taking Active   Med List Note Janett Billow, RN 01/05/18 1120): UDS 01/05/18             Assessment:   Goals Addressed            This Visit's Progress     Patient Stated   . PharmD "I am in a lot of pain" (pt-stated)       Current Barriers:  . Polypharmacy; complex patient with multiple comorbidities including pre-diabetes, chronic pain, depression, COPD, tobacco abuse o Chronic pain - reports lower back, hip, shoulder. Previously followed w/ Dr. Holley Raring pain management, last seen 04/2018. Notes she would prefer to see another provider.  - Self titrated and discontinued carisoprodol and amitriptyline. Notes no change in pain or sleep with discontinuation - Current medication regimen: duloxetine 60 mg daily, gabapentin 800 mg TID, naproxen PRN excrutiating pain; notes that ibuprofen impacted kidney function previously - Denies any benefit from lidoderm patches or Voltaren gel before  o Depression/anxiety: duloxetine 60 mg daily, bupropion XL 300 mg daily, buspirone 10 mg TID, hydroxyzine PRN; notes  concerns with insomnia, but believes this to be most related to lack of pain control o Pre-diabetes: metformin 500 mg TID; last A1c 6%; notes that weight loss is something she would like to work on. Generally doesn't eat breakfast or lunch because she isn't hungry, but will then get hungry in the evening and overeat at supper time o Acid reflux: patient notes she was on omeprazole but insurance stopped covering; is planning on calling to see which PPI is preferred. Currently taking OTC famotidine 10 mg BID  o HTN: telmisartan/HCTZ 40/12.5 mg QAM; BP well controlled o COPD: Incruse daily + albuterol HFA PRN; notes infrequent use of albuterol recently  Pharmacist Clinical Goal(s):  Marland Kitchen Over the next 90 days, patient will work with PharmD and provider towards optimized medication management  Interventions: . Comprehensive medication review performed; medication list updated in electronic medical record . Discussed referral to pain management- patient amenable to this. Collaborating with Marnee Guarneri for this referral. Recommended she not self-adjust any of her medications for pain until she meets with pain management. She verbalized understanding . Discussed concerns with weight. GLP1 therapy was discussed; discussed that we could consider something like Saxenda or off-label Victoza/Ozempic. Appetite suppressant effect may provide benefit in patient's "binge" eating at supper . Also recommended choosing to eat a late afternoon snack high in protein (cheese, greek yogurt) so that she isn't so hungry at supper time. She notes she would try this.  Patient Self Care Activities:  . Patient will take medications as prescribed  Initial goal documentation        Plan: - Will collaborate with Marnee Guarneri, NP on above - Will outreach patient in the next 3-5 weeks for continued medication management support   Catie Darnelle Maffucci, PharmD Clinical Pharmacist [place] [phone]

## 2019-09-26 NOTE — Progress Notes (Signed)
Pain management referral.  She has self-tapered to discontinuation of Soma and Amitriptyline.

## 2019-09-29 ENCOUNTER — Other Ambulatory Visit: Payer: Self-pay

## 2019-09-29 ENCOUNTER — Ambulatory Visit
Admission: RE | Admit: 2019-09-29 | Discharge: 2019-09-29 | Disposition: A | Discharge status: Home / Self Care | Payer: BC Managed Care – PPO | Source: Ambulatory Visit | Attending: Nurse Practitioner | Admitting: Nurse Practitioner

## 2019-09-29 DIAGNOSIS — Z122 Encounter for screening for malignant neoplasm of respiratory organs: Secondary | ICD-10-CM | POA: Diagnosis not present

## 2019-09-29 DIAGNOSIS — Z87891 Personal history of nicotine dependence: Secondary | ICD-10-CM | POA: Insufficient documentation

## 2019-10-03 ENCOUNTER — Encounter: Payer: Self-pay | Admitting: Nurse Practitioner

## 2019-10-03 ENCOUNTER — Encounter: Payer: Self-pay | Admitting: *Deleted

## 2019-10-03 DIAGNOSIS — I700 Atherosclerosis of aorta: Secondary | ICD-10-CM | POA: Insufficient documentation

## 2019-10-03 DIAGNOSIS — I2510 Atherosclerotic heart disease of native coronary artery without angina pectoris: Secondary | ICD-10-CM | POA: Insufficient documentation

## 2019-10-25 ENCOUNTER — Telehealth: Payer: Self-pay

## 2019-10-25 ENCOUNTER — Ambulatory Visit: Payer: Self-pay | Admitting: Pharmacist

## 2019-10-25 NOTE — Chronic Care Management (AMB) (Signed)
  Chronic Care Management   Note  10/25/2019 Name: [name] MRN: [id number] DOB: [birth year]  [name] is a 63 y.o. year old female who is a primary care patient of Cannady, Barbaraann Faster, NP. The CCM team was consulted for assistance with chronic disease management and care coordination needs.    Attempted to contact patient to follow up on medication management needs. Left HIPAA compliant message for patient to return my call at her convenience.   Follow up plan: - If I do not hear back, will outreach again in the next 4-5 weeks   Catie Darnelle Maffucci, PharmD, [place] [phone]

## 2019-10-30 ENCOUNTER — Other Ambulatory Visit: Payer: Self-pay | Admitting: Nurse Practitioner

## 2019-11-21 ENCOUNTER — Ambulatory Visit: Payer: Self-pay | Admitting: Pharmacist

## 2019-11-21 ENCOUNTER — Telehealth: Payer: Self-pay

## 2019-11-21 NOTE — Chronic Care Management (AMB) (Signed)
  Chronic Care Management   Note  11/21/2019 Name: [name] MRN: [id number] DOB: 1956-11-28  [name] is a 63 y.o. year old female who is a primary care patient of Cannady, Barbaraann Faster, NP. The CCM team was consulted for assistance with chronic disease management and care coordination needs.     Attempted to contact patient for medication management review. Left HIPAA compliant message for patient to return my call at her convenience.   Will collaborate w/ scheduling Care Guide to outreach patient and schedule a follow up phone appointment for medication management review.   Catie Darnelle Maffucci, PharmD, [place] [phone]

## 2019-11-27 ENCOUNTER — Telehealth: Payer: Self-pay | Admitting: Nurse Practitioner

## 2019-11-27 NOTE — Chronic Care Management (AMB) (Signed)
  Care Management   Outreach Note  11/27/2019 Name: [name] MRN: [id number] DOB: [birth year]  Referred by: Venita Lick, NP Reason for referral : Care Coordination (Initial CM outreac to r/s follow up appt. )   An unsuccessful telephone outreach was attempted today. The patient was referred to the case management team by for assistance with care management and care coordination.   Follow Up Plan: A HIPPA compliant phone message was left for the patient providing contact information and requesting a return call.  The care management team will reach out to the patient again over the next 7 days.  If patient returns call to provider office, please advise to call [facility] [place], [place] [facility]  [place], [place] [address] Direct Dial: [email or web address]  Website: [email or web address]

## 2019-11-29 NOTE — Chronic Care Management (AMB) (Signed)
°  Care Management   Note  11/29/2019 Name: [name] MRN: [id number] DOB: 1956-08-28  [name] is a 63 y.o. year old female who is a primary care patient of Venita Lick, NP and is actively engaged with the care management team. I reached out to [name] by phone today to assist with re-schedulinga follow up appointment with the Pharmacist  Follow up plan: Telephone appointment with Care Managment team member scheduled for: 12/29/2019  [place], [place] [facility]  [place], [place] [address] Direct Dial: [email or web address]  Website: [email or web address]

## 2019-12-11 ENCOUNTER — Other Ambulatory Visit: Payer: Self-pay | Admitting: Nurse Practitioner

## 2019-12-11 NOTE — Telephone Encounter (Signed)
Requested Prescriptions  Pending Prescriptions Disp Refills  . hydrOXYzine (ATARAX/VISTARIL) 10 MG tablet [Pharmacy Med Name: hydrOXYzine HCl 10 MG Oral Tablet] 90 tablet 0    Sig: Take 1 tablet by mouth three times daily as needed     Ear, Nose, and Throat:  Antihistamines Passed - 12/11/2019 12:39 AM      Passed - Valid encounter within last 12 months    Recent Outpatient Visits          3 months ago COPD, severe ([place])   [place], Jolene T, NP   8 months ago Depression, major, single episode, in partial remission ([place])   [place], Jolene T, NP   11 months ago COPD, severe ([place])   [place], Barbaraann Faster, NP   1 year ago Benign hypertension   [place], Austin T, NP   1 year ago COPD, severe ([place])   [place] Trinna Post, PA-C      Future Appointments            In 2 months Cannady, Barbaraann Faster, NP [facility]

## 2019-12-20 ENCOUNTER — Ambulatory Visit: Payer: Self-pay | Admitting: Pharmacist

## 2019-12-20 NOTE — Chronic Care Management (AMB) (Signed)
  Chronic Care Management   Note  12/20/2019 Name: [name] MRN: [id number] DOB: [birth year]  [name] is a 64 y.o. year old female who is a primary care patient of Cannady, Barbaraann Faster, NP. The CCM team was consulted for assistance with chronic disease management and care coordination needs.    Received call from patient that she received notification that her Incruse is no longer going to be covered. I called back to discuss, but had to leave a voicemail. I encouraged her to call [employer] and see what preferred alternatives are, and call us back with that information.   Catie Darnelle Maffucci, PharmD, [place] [phone]

## 2019-12-21 ENCOUNTER — Encounter: Payer: Self-pay | Admitting: Pharmacist

## 2019-12-21 ENCOUNTER — Other Ambulatory Visit: Payer: Self-pay | Admitting: Nurse Practitioner

## 2019-12-21 ENCOUNTER — Ambulatory Visit: Payer: Self-pay | Admitting: Pharmacist

## 2019-12-21 DIAGNOSIS — J449 Chronic obstructive pulmonary disease, unspecified: Secondary | ICD-10-CM

## 2019-12-21 MED ORDER — SPIRIVA RESPIMAT 2.5 MCG/ACT IN AERS: 2.0000 | INHALATION_SPRAY | Freq: Every day | RESPIRATORY_TRACT | 5 refills | Status: DC

## 2019-12-21 NOTE — Chronic Care Management (AMB) (Signed)
Chronic Care Management   Follow Up Note   12/21/2019 Name: MELAT MARINO MRN: [id number] DOB: [birth year]  Referred by: Venita Lick, NP Reason for referral : Chronic Care Management (Medication Management)   [name] is a 64 y.o. year old female who is a primary care patient of Cannady, Barbaraann Faster, NP. The CCM team was consulted for assistance with chronic disease management and care coordination needs.    Received return call from patient.   Review of patient status, including review of consultants reports, relevant laboratory and other test results, and collaboration with appropriate care team members and the patient's provider was performed as part of comprehensive patient evaluation and provision of chronic care management services.    SDOH (Social Determinants of Health) screening performed today: Financial Strain . See Care Plan for related entries.   Outpatient Encounter Medications as of 12/21/2019  Medication Sig  . albuterol (PROAIR HFA) 108 (90 Base) MCG/ACT inhaler Inhale 2 puffs into the lungs every 6 (six) hours as needed for wheezing or shortness of breath. (Patient not taking: Reported on 09/26/2019)  . aspirin 81 MG tablet Take 81 mg by mouth daily.  Marland Kitchen atorvastatin (LIPITOR) 40 MG tablet Take 1 tablet (40 mg total) by mouth daily.  Marland Kitchen buPROPion (WELLBUTRIN XL) 300 MG 24 hr tablet Take 1 tablet by mouth once daily  . busPIRone (BUSPAR) 10 MG tablet Take 1 tablet (10 mg total) by mouth 3 (three) times daily.  . Calcium Citrate-Vitamin D (CALCIUM + D PO) Take 1,000 mg by mouth daily.  . DULoxetine (CYMBALTA) 60 MG capsule Take 1 capsule (60 mg total) by mouth daily.  . famotidine (PEPCID) 10 MG tablet Take 10 mg by mouth 2 (two) times daily.  . furosemide (LASIX) 20 MG tablet Take 1 tablet (20 mg total) by mouth daily.  Marland Kitchen gabapentin (NEURONTIN) 800 MG tablet Take 1 tablet (800 mg total) by mouth 3 (three) times daily.  . hydrOXYzine (ATARAX/VISTARIL) 10 MG  tablet Take 1 tablet by mouth three times daily as needed  . metFORMIN (GLUCOPHAGE) 500 MG tablet Take 1 tablet (500 mg total) by mouth 3 (three) times daily with meals.  . Multiple Vitamins-Minerals (MULTIVITAMIN WITH MINERALS) tablet Take 1 tablet by mouth daily.  . naproxen (NAPROSYN) 500 MG tablet Take 500 mg by mouth 2 (two) times daily with a meal.  . omeprazole (PRILOSEC) 40 MG capsule Take 1 capsule (40 mg total) by mouth daily.  . potassium chloride SA (KLOR-CON) 20 MEQ tablet TAKE 1  BY MOUTH ONCE DAILY  . telmisartan-hydrochlorothiazide (MICARDIS HCT) 40-12.5 MG tablet Take 1 tablet by mouth daily.  Marland Kitchen umeclidinium bromide (INCRUSE ELLIPTA) 62.5 MCG/INH AEPB Inhale 1 puff into the lungs daily.  . vitamin B-12 (CYANOCOBALAMIN) 1000 MCG tablet Take 1,000 mcg by mouth daily.  . [DISCONTINUED] atorvastatin (LIPITOR) 40 MG tablet Take 1 tablet by mouth once daily  . [DISCONTINUED] omeprazole (PRILOSEC) 40 MG capsule Take 1 capsule by mouth once daily  . [DISCONTINUED] potassium chloride SA (K-DUR,KLOR-CON) 20 MEQ tablet Take 1 tablet by mouth once daily   No facility-administered encounter medications on file as of 12/21/2019.     Objective:   Goals Addressed            This Visit's Progress     Patient Stated   . PharmD "I am in a lot of pain" (pt-stated)       Current Barriers:  . Polypharmacy; complex patient with multiple comorbidities including pre-diabetes,  chronic pain, depression, COPD, tobacco abuse o Chronic pain - Current medication regimen: duloxetine 60 mg daily, gabapentin 800 mg TID, naproxen PRN excrutiating pain; notes that ibuprofen impacted kidney function previously - Denies any benefit from lidoderm patches or Voltaren gel before  o Depression/anxiety: duloxetine 60 mg daily, bupropion XL 300 mg daily, buspirone 10 mg TID, hydroxyzine PRN;  o Pre-diabetes: metformin 500 mg TID; last A1c 6%;  o Acid reflux:OTC famotidine 10 mg BID  o HTN: telmisartan/HCTZ  40/12.5 mg QAM; BP well controlled o COPD: Incruse daily + albuterol HFA PRN; received call yesterday that her insurance no longer preferred Incruse. Asked her to check on preferred option.  Pharmacist Clinical Goal(s):  Marland Kitchen Over the next 90 days, patient will work with PharmD and provider towards optimized medication management  Interventions: . Patient noted that Spiriva is preferred on her insurance plan. Will collaborate w/ Marnee Guarneri to send prescription for Spiriva Respimat, as this medication has an available copay card to help reduce the cost of the medication.  . Called patient back, left message with this information. Will send her copay card website link in [place] message. Encouraged her to discuss administration technique w/ the pharmacist, but to call me if she had continued device questions  Patient Self Care Activities:  . Patient will take medications as prescribed  Please see past updates related to this goal by clicking on the "Past Updates" button in the selected goal          Plan:  - Will follow up with patient as previously scheduled  Catie Darnelle Maffucci, PharmD, [place] [phone]

## 2019-12-21 NOTE — Patient Instructions (Signed)
Visit Information  Goals Addressed            This Visit's Progress     Patient Stated   . PharmD "I am in a lot of pain" (pt-stated)       Current Barriers:  . Polypharmacy; complex patient with multiple comorbidities including pre-diabetes, chronic pain, depression, COPD, tobacco abuse o Chronic pain - Current medication regimen: duloxetine 60 mg daily, gabapentin 800 mg TID, naproxen PRN excrutiating pain; notes that ibuprofen impacted kidney function previously - Denies any benefit from lidoderm patches or Voltaren gel before  o Depression/anxiety: duloxetine 60 mg daily, bupropion XL 300 mg daily, buspirone 10 mg TID, hydroxyzine PRN;  o Pre-diabetes: metformin 500 mg TID; last A1c 6%;  o Acid reflux:OTC famotidine 10 mg BID  o HTN: telmisartan/HCTZ 40/12.5 mg QAM; BP well controlled o COPD: Incruse daily + albuterol HFA PRN; received call yesterday that her insurance no longer preferred Incruse. Asked her to check on preferred option.  Pharmacist Clinical Goal(s):  Marland Kitchen Over the next 90 days, patient will work with PharmD and provider towards optimized medication management  Interventions: . Patient noted that Spiriva is preferred on her insurance plan. Will collaborate w/ Marnee Guarneri to send prescription for Spiriva Respimat, as this medication has an available copay card to help reduce the cost of the medication.  . Called patient back, left message with this information. Will send her copay card website link in [place] message. Encouraged her to discuss administration technique w/ the pharmacist, but to call me if she had continued device questions  Patient Self Care Activities:  . Patient will take medications as prescribed  Please see past updates related to this goal by clicking on the "Past Updates" button in the selected goal         The patient verbalized understanding of instructions provided today and declined a print copy of patient instruction materials.    Plan:  - Will follow up with patient as previously scheduled  Catie Darnelle Maffucci, PharmD, [place] [phone]

## 2019-12-26 ENCOUNTER — Encounter: Payer: Self-pay | Admitting: Nurse Practitioner

## 2019-12-26 ENCOUNTER — Telehealth: Payer: Self-pay | Admitting: Nurse Practitioner

## 2019-12-26 ENCOUNTER — Other Ambulatory Visit: Payer: Self-pay | Admitting: Nurse Practitioner

## 2019-12-26 MED ORDER — GABAPENTIN 800 MG PO TABS: 800.0000 mg | ORAL_TABLET | Freq: Three times a day (TID) | ORAL | 3 refills | Status: DC

## 2019-12-26 NOTE — Chronic Care Management (AMB) (Signed)
  Care Management   Note  12/26/2019 Name: [name] MRN: [id number] DOB: February 16, 1956  [name] is a 64 y.o. year old female who is a primary care patient of Venita Lick, NP and is actively engaged with the care management team. I reached out to [name] by phone today to assist with re-scheduling a follow up visit with the Pharmacist  Follow up plan: Telephone appointment with care management team member scheduled for: 01/19/2020  Noreene Larsson, [place], [place], [place] [address] Direct Dial: [phone] [email or web address] Website: [email or web address]

## 2019-12-29 ENCOUNTER — Telehealth: Payer: BC Managed Care – PPO

## 2020-01-08 ENCOUNTER — Other Ambulatory Visit: Payer: Self-pay | Admitting: Nurse Practitioner

## 2020-01-08 NOTE — Telephone Encounter (Signed)
Requested Prescriptions  Pending Prescriptions Disp Refills  . metFORMIN (GLUCOPHAGE) 500 MG tablet [Pharmacy Med Name: metFORMIN HCl 500 MG Oral Tablet] 270 tablet 0    Sig: TAKE 1 TABLET BY MOUTH THREE TIMES DAILY WITH  MEALS     Endocrinology:  Diabetes - Biguanides Failed - 01/08/2020  1:42 PM      Failed - Cr in normal range and within 360 days    Creatinine  Date Value Ref Range Status  03/23/2014 0.99 0.60 - 1.30 mg/dL Final   Creatinine, Ser  Date Value Ref Range Status  09/04/2019 1.12 (H) 0.57 - 1.00 mg/dL Final         Passed - HBA1C is between 0 and 7.9 and within 180 days    HB A1C (BAYER DCA - WAIVED)  Date Value Ref Range Status  06/28/2018 5.7 <7.0 % Final    Comment:                                          Diabetic Adult            <7.0                                       Healthy Adult        4.3 - 5.7                                                           (DCCT/NGSP) American Diabetes Association's Summary of Glycemic Recommendations for Adults with Diabetes: Hemoglobin A1c <7.0%. More stringent glycemic goals (A1c <6.0%) may further reduce complications at the cost of increased risk of hypoglycemia.    Hgb A1c MFr Bld  Date Value Ref Range Status  09/04/2019 6.0 (H) 4.8 - 5.6 % Final    Comment:             Prediabetes: 5.7 - 6.4          Diabetes: >6.4          Glycemic control for adults with diabetes: <7.0          Passed - eGFR in normal range and within 360 days    EGFR (African American)  Date Value Ref Range Status  03/23/2014 >60  Final   GFR calc Af Amer  Date Value Ref Range Status  09/04/2019 60 >59 mL/min/1.73 Final   EGFR (Non-African Amer.)  Date Value Ref Range Status  03/23/2014 >60  Final    Comment:    eGFR values <55m/min/1.73 m2 may be an indication of chronic kidney disease (CKD). Calculated eGFR is useful in patients with stable renal function. The eGFR calculation will not be reliable in acutely ill patients when  serum creatinine is changing rapidly. It is not useful in  patients on dialysis. The eGFR calculation may not be applicable to patients at the low and high extremes of body sizes, pregnant women, and vegetarians.    GFR calc non Af Amer  Date Value Ref Range Status  09/04/2019 52 (L) >59 mL/min/1.73 Final         Passed - Valid encounter  within last 6 months    Recent Outpatient Visits          4 months ago COPD, severe ([place])   [place], Jolene T, NP   9 months ago Depression, major, single episode, in partial remission ([place])   [place], [place] T, NP   1 year ago COPD, severe ([place])   [place], Barbaraann Faster, NP   1 year ago Benign hypertension   [place], [place] T, NP   1 year ago COPD, severe ([place])   [place], [place], PA-C      Future Appointments            In 1 month Cannady, Barbaraann Faster, NP [facility]           . hydrOXYzine (ATARAX/VISTARIL) 10 MG tablet [employer] Med Name: hydrOXYzine HCl 10 MG Oral Tablet] 90 tablet 0    Sig: Take 1 tablet by mouth three times daily as needed     Ear, Nose, and Throat:  Antihistamines Passed - 01/08/2020  1:42 PM      Passed - Valid encounter within last 12 months    Recent Outpatient Visits          4 months ago COPD, severe ([place])   [place], Jolene T, NP   9 months ago Depression, major, single episode, in partial remission ([place])   [place], [place] T, NP   1 year ago COPD, severe ([place])   [place], Barbaraann Faster, NP   1 year ago Benign hypertension   [place], E[place]arlee T, NP   1 year ago COPD, severe ([place])   [place] Trinna Post, PA-C      Future Appointments            In 1 month Cannady, Barbaraann Faster, NP [facility], [facility]

## 2020-01-19 ENCOUNTER — Other Ambulatory Visit: Payer: Self-pay | Admitting: Nurse Practitioner

## 2020-01-19 ENCOUNTER — Ambulatory Visit: Payer: BC Managed Care – PPO | Admitting: Pharmacist

## 2020-01-19 DIAGNOSIS — J449 Chronic obstructive pulmonary disease, unspecified: Secondary | ICD-10-CM

## 2020-01-19 DIAGNOSIS — M5136 Other intervertebral disc degeneration, lumbar region: Secondary | ICD-10-CM

## 2020-01-19 DIAGNOSIS — F324 Major depressive disorder, single episode, in partial remission: Secondary | ICD-10-CM

## 2020-01-19 MED ORDER — ALBUTEROL SULFATE HFA 108 (90 BASE) MCG/ACT IN AERS: 2.0000 | INHALATION_SPRAY | Freq: Four times a day (QID) | RESPIRATORY_TRACT | 5 refills | Status: DC | PRN

## 2020-01-19 NOTE — Patient Instructions (Signed)
Visit Information  Goals Addressed            This Visit's Progress     Patient Stated   . PharmD "I am in a lot of pain" (pt-stated)       CARE PLAN ENTRY (see longtitudinal plan of care for additional care plan information)  Current Barriers:  . Polypharmacy; complex patient with multiple comorbidities including pre-diabetes, chronic pain, depression, COPD, tobacco abuse o Chronic pain - Current medication regimen: duloxetine 60 mg daily, gabapentin 800 mg TID, Goody powders TWICE daily. Reports increased issues with muscle spasms in her back and legs. Had a few leftover carisoprodol that she used, but wonders if there is a safer muscle relaxer. Indicates that back pain/arthritis pain continue to be impactful to her quality of life  - Notes that she cannot afford Pain Management right now, due to multiple other expenses. Reports that her grocery bill has "doubled" since the start of the pandemic. Was denied for Medicaid d/t "making $80 too much" - Stopped naproxen d/t lack of benefit, motes that ibuprofen impacted kidney function previously - Denies any benefit from lidoderm patches or Voltaren gel before  o Depression/anxiety: duloxetine 60 mg daily - stopped bupropion, buspirone 10 mg TID, hydroxyzine TID - reports that it has been "weeks" since her last panic attack, feels that her mood is well controlled right now o Pre-diabetes: metformin 500 mg TID; last A1c 6% o ASCVD risk reduction: ASA 81 mg daily o Acid reflux: had significant heart burn w/ famotidine, so switched to lansoprazole 15 mg daily.  o HTN: telmisartan/HCTZ 40/12.5 mg QAM; BP well controlled o COPD: Spiriva Handihaler 2.5 mcg; 2 puffs daily. Reports infrequent need for albuterol use. Requests refill on albuterol, as her current inhaler is expired.   Pharmacist Clinical Goal(s):  Marland Kitchen Over the next 90 days, patient will work with PharmD and provider towards optimized medication  management  Interventions: . Comprehensive medication review performed, medication list updated in electronic medical record . Reviewed risks of high doses of aspirin daily (including still taking daily aspirin 81 mg tablet). Discussed risk of GI ulcers, bleeding events. Reviewed that it is probably a good thing she is back on PPI therapy for prevention of GI ulcer. Patient notes that no other options, including around the clock acetaminophen, have helped her pain.  Marland Kitchen Pl[place] Care Guide referral for financial support and need to connect with Pain Management . Discussed that there are safer muscle relaxers than cardisoprodol, including tizanidine, given lower risk of sedation and anticholinergic effects. Will discuss w/ Jolene Cannady and see if a sooner f/u appointment is needed . Will collaborate w/ PCP for refill on albuterol   Patient Self Care Activities:  . Patient will take medications as prescribed  Please see past updates related to this goal by clicking on the "Past Updates" button in the selected goal         Patient verbalizes understanding of instructions provided today.    Plan:  - Scheduled f/u call 03/19/20  Catie Darnelle Maffucci, PharmD, [place] [phone]

## 2020-01-19 NOTE — Chronic Care Management (AMB) (Signed)
Chronic Care Management   Follow Up Note   01/19/2020 Name: [name] MRN: [id number] DOB: [birth year]  Referred by: Kathleen Lick, NP Reason for referral : Chronic Care Management (Medication Management)   [name] is a 64 y.o. year old female who is a primary care patient of Cannady, Kathleen Faster, NP. The CCM team was consulted for assistance with chronic disease management and care coordination needs.    Contacted patient for medication management review.   Review of patient status, including review of consultants reports, relevant laboratory and other test results, and collaboration with appropriate care team members and the patient's provider was performed as part of comprehensive patient evaluation and provision of chronic care management services.    SDOH (Social Determinants of Health) assessments performed: Yes SDOH Interventions     Most Recent Value  SDOH Interventions  SDOH Interventions for the Following Domains  Financial Strain  Financial Strain Interventions  Other (Comment) [Care Guide referral placed]       Outpatient Encounter Medications as of 01/19/2020  Medication Sig  . ascorbic acid (VITAMIN C) 500 MG tablet Take 500 mg by mouth daily.  Marland Kitchen aspirin 81 MG tablet Take 81 mg by mouth daily.  . Aspirin-Acetaminophen-Caffeine (GOODYS EXTRA STRENGTH) 500-325-65 MG PACK Take 2 Units by mouth in the morning and at bedtime.  Marland Kitchen atorvastatin (LIPITOR) 40 MG tablet Take 1 tablet (40 mg total) by mouth daily.  . busPIRone (BUSPAR) 10 MG tablet Take 1 tablet (10 mg total) by mouth 3 (three) times daily.  . Calcium Citrate-Vitamin D (CALCIUM + D PO) Take 1,000 mg by mouth daily.  . DULoxetine (CYMBALTA) 60 MG capsule Take 1 capsule (60 mg total) by mouth daily.  . furosemide (LASIX) 20 MG tablet Take 1 tablet (20 mg total) by mouth daily.  Marland Kitchen gabapentin (NEURONTIN) 800 MG tablet Take 1 tablet (800 mg total) by mouth 3 (three) times daily.  . hydrOXYzine  (ATARAX/VISTARIL) 10 MG tablet Take 1 tablet by mouth three times daily as needed  . lansoprazole (PREVACID) 15 MG capsule Take 15 mg by mouth daily.  . metFORMIN (GLUCOPHAGE) 500 MG tablet TAKE 1 TABLET BY MOUTH THREE TIMES DAILY WITH  MEALS  . Multiple Vitamins-Minerals (MULTIVITAMIN WITH MINERALS) tablet Take 1 tablet by mouth daily.  . potassium chloride SA (KLOR-CON) 20 MEQ tablet TAKE 1  BY MOUTH ONCE DAILY  . telmisartan-hydrochlorothiazide (MICARDIS HCT) 40-12.5 MG tablet Take 1 tablet by mouth daily.  . Tiotropium Bromide Monohydrate (SPIRIVA RESPIMAT) 2.5 MCG/ACT AERS Inhale 2 puffs into the lungs daily.  . vitamin B-12 (CYANOCOBALAMIN) 1000 MCG tablet Take 1,000 mcg by mouth daily.  Marland Kitchen albuterol (PROAIR HFA) 108 (90 Base) MCG/ACT inhaler Inhale 2 puffs into the lungs every 6 (six) hours as needed for wheezing or shortness of breath. (Patient not taking: Reported on 09/26/2019)  . [DISCONTINUED] atorvastatin (LIPITOR) 40 MG tablet Take 1 tablet by mouth once daily  . [DISCONTINUED] buPROPion (WELLBUTRIN XL) 300 MG 24 hr tablet Take 1 tablet by mouth once daily  . [DISCONTINUED] famotidine (PEPCID) 10 MG tablet Take 10 mg by mouth 2 (two) times daily.  . [DISCONTINUED] naproxen (NAPROSYN) 500 MG tablet Take 500 mg by mouth 2 (two) times daily with a meal.  . [DISCONTINUED] omeprazole (PRILOSEC) 40 MG capsule Take 1 capsule by mouth once daily  . [DISCONTINUED] omeprazole (PRILOSEC) 40 MG capsule Take 1 capsule (40 mg total) by mouth daily.  . [DISCONTINUED] potassium chloride SA (K-DUR,KLOR-CON) 20  MEQ tablet Take 1 tablet by mouth once daily   No facility-administered encounter medications on file as of 01/19/2020.     Objective:   Goals Addressed            This Visit's Progress     Patient Stated   . PharmD "I am in a lot of pain" (pt-stated)       CARE PLAN ENTRY (see longtitudinal plan of care for additional care plan information)  Current Barriers:  . Polypharmacy;  complex patient with multiple comorbidities including pre-diabetes, chronic pain, depression, COPD, tobacco abuse o Chronic pain - Current medication regimen: duloxetine 60 mg daily, gabapentin 800 mg TID, Goody powders TWICE daily. Reports increased issues with muscle spasms in her back and legs. Had a few leftover carisoprodol that she used, but wonders if there is a safer muscle relaxer. Indicates that back pain/arthritis pain continue to be impactful to her quality of life  - Notes that she cannot afford Pain Management right now, due to multiple other expenses. Reports that her grocery bill has "doubled" since the start of the pandemic. Was denied for Medicaid d/t "making $80 too much" - Stopped naproxen d/t lack of benefit, motes that ibuprofen impacted kidney function previously - Denies any benefit from lidoderm patches or Voltaren gel before  o Depression/anxiety: duloxetine 60 mg daily - stopped bupropion, buspirone 10 mg TID, hydroxyzine TID - reports that it has been "weeks" since her last panic attack, feels that her mood is well controlled right now o Pre-diabetes: metformin 500 mg TID; last A1c 6% o ASCVD risk reduction: ASA 81 mg daily o Acid reflux: had significant heart burn w/ famotidine, so switched to lansoprazole 15 mg daily.  o HTN: telmisartan/HCTZ 40/12.5 mg QAM; BP well controlled o COPD: Spiriva Handihaler 2.5 mcg; 2 puffs daily. Reports infrequent need for albuterol use. Requests refill on albuterol, as her current inhaler is expired.   Pharmacist Clinical Goal(s):  Marland Kitchen Over the next 90 days, patient will work with PharmD and provider towards optimized medication management  Interventions: . Comprehensive medication review performed, medication list updated in electronic medical record . Reviewed risks of high doses of aspirin daily (including still taking daily aspirin 81 mg tablet). Discussed risk of GI ulcers, bleeding events. Reviewed that it is probably a good thing  she is back on PPI therapy for prevention of GI ulcer. Patient notes that no other options, including around the clock acetaminophen, have helped her pain.  Marland Kitchen Placing Care Guide referral for financial support and need to connect with Pain Management . Discussed that there are safer muscle relaxers than cardisoprodol, including tizanidine, given lower risk of sedation and anticholinergic effects. Will discuss w/ Jolene Cannady and see if a sooner f/u appointment is needed . Will collaborate w/ PCP for refill on albuterol   Patient Self Care Activities:  . Patient will take medications as prescribed  Please see past updates related to this goal by clicking on the "Past Updates" button in the selected goal          Plan:  - Scheduled f/u call 03/19/20  Catie Darnelle Maffucci, PharmD, [place] [phone]

## 2020-01-22 ENCOUNTER — Other Ambulatory Visit: Payer: Self-pay

## 2020-01-22 ENCOUNTER — Ambulatory Visit (INDEPENDENT_AMBULATORY_CARE_PROVIDER_SITE_OTHER): Payer: BC Managed Care – PPO | Admitting: Nurse Practitioner

## 2020-01-22 ENCOUNTER — Encounter: Payer: Self-pay | Admitting: Nurse Practitioner

## 2020-01-22 DIAGNOSIS — G894 Chronic pain syndrome: Secondary | ICD-10-CM | POA: Diagnosis not present

## 2020-01-22 MED ORDER — POTASSIUM CHLORIDE CRYS ER 20 MEQ PO TBCR: EXTENDED_RELEASE_TABLET | ORAL | 3 refills | Status: DC

## 2020-01-22 MED ORDER — DULOXETINE HCL 60 MG PO CPEP: 60.0000 mg | ORAL_CAPSULE | Freq: Every day | ORAL | 2 refills | Status: DC

## 2020-01-22 MED ORDER — TIZANIDINE HCL 4 MG PO CAPS: 4.0000 mg | ORAL_CAPSULE | Freq: Three times a day (TID) | ORAL | 3 refills | Status: DC

## 2020-01-22 NOTE — Patient Instructions (Signed)
Acute Back Pain, Adult Acute back pain is sudden and usually short-lived. It is often caused by an injury to the muscles and tissues in the back. The injury may result from:  A muscle or ligament getting overstretched or torn (strained). Ligaments are tissues that connect bones to each other. Lifting something improperly can cause a back strain.  Wear and tear (degeneration) of the spinal disks. Spinal disks are circular tissue that provides cushioning between the bones of the spine (vertebrae).  Twisting motions, such as while playing sports or doing yard work.  A hit to the back.  Arthritis. You may have a physical exam, lab tests, and imaging tests to find the cause of your pain. Acute back pain usually goes away with rest and home care. Follow these instructions at home: Managing pain, stiffness, and swelling  Take over-the-counter and prescription medicines only as told by your health care provider.  Your health care provider may recommend applying ice during the first 24-48 hours after your pain starts. To do this: ? Put ice in a plastic bag. ? Place a towel between your skin and the bag. ? Leave the ice on for 20 minutes, 2-3 times a day.  If directed, apply heat to the affected area as often as told by your health care provider. Use the heat source that your health care provider recommends, such as a moist heat pack or a heating pad. ? Place a towel between your skin and the heat source. ? Leave the heat on for 20-30 minutes. ? Remove the heat if your skin turns bright red. This is especially important if you are unable to feel pain, heat, or cold. You have a greater risk of getting burned. Activity   Do not stay in bed. Staying in bed for more than 1-2 days can delay your recovery.  Sit up and stand up straight. Avoid leaning forward when you sit, or hunching over when you stand. ? If you work at a desk, sit close to it so you do not need to lean over. Keep your chin tucked  in. Keep your neck drawn back, and keep your elbows bent at a right angle. Your arms should look like the letter "L." ? Sit high and close to the steering wheel when you drive. Add lower back (lumbar) support to your car seat, if needed.  Take short walks on even surfaces as soon as you are able. Try to increase the length of time you walk each day.  Do not sit, drive, or stand in one place for more than 30 minutes at a time. Sitting or standing for long periods of time can put stress on your back.  Do not drive or use heavy machinery while taking prescription pain medicine.  Use proper lifting techniques. When you bend and lift, use positions that put less stress on your back: ? Bend your knees. ? Keep the load close to your body. ? Avoid twisting.  Exercise regularly as told by your health care provider. Exercising helps your back heal faster and helps prevent back injuries by keeping muscles strong and flexible.  Work with a physical therapist to make a safe exercise program, as recommended by your health care provider. Do any exercises as told by your physical therapist. Lifestyle  Maintain a healthy weight. Extra weight puts stress on your back and makes it difficult to have good posture.  Avoid activities or situations that make you feel anxious or stressed. Stress and anxiety increase muscle   tension and can make back pain worse. Learn ways to manage anxiety and stress, such as through exercise. General instructions  Sleep on a firm mattress in a comfortable position. Try lying on your side with your knees slightly bent. If you lie on your back, put a pillow under your knees.  Follow your treatment plan as told by your health care provider. This may include: ? Cognitive or behavioral therapy. ? Acupuncture or massage therapy. ? Meditation or yoga. Contact a health care provider if:  You have pain that is not relieved with rest or medicine.  You have increasing pain going down  into your legs or buttocks.  Your pain does not improve after 2 weeks.  You have pain at night.  You lose weight without trying.  You have a fever or chills. Get help right away if:  You develop new bowel or bladder control problems.  You have unusual weakness or numbness in your arms or legs.  You develop nausea or vomiting.  You develop abdominal pain.  You feel faint. Summary  Acute back pain is sudden and usually short-lived.  Use proper lifting techniques. When you bend and lift, use positions that put less stress on your back.  Take over-the-counter and prescription medicines and apply heat or ice as directed by your health care provider. This information is not intended to replace advice given to you by your health care provider. Make sure you discuss any questions you have with your health care provider. Document Revised: 03/07/2019 Document Reviewed: 06/30/2017 Elsevier Patient Education  2020 Elsevier Inc.  

## 2020-01-22 NOTE — Assessment & Plan Note (Addendum)
Chronic, ongoing with no current pain clinic.  Multi factorial due to morbid obesity and co morbidities.  Would benefit from return to pain clinic, but does not wish to return at this time.  Agrees to return to different pain clinic in future if needed.  Would also benefit from PT time.  Tizanidine script sent.  Recommend use of Tylenol as needed + Lidocaine patches or Voltaren gel.  Apply heat to back as needed.  Return in 4 weeks for follow-up.

## 2020-01-22 NOTE — Progress Notes (Signed)
BP 92/61   Pulse 85   Temp 98.7 F (37.1 C) (Oral)   LMP  (LMP Unknown)   SpO2 95%    Subjective:    Patient ID: [name], female    DOB: [birth year], 64 y.o.   MRN: [id number]  HPI: [name] is a 64 y.o. female  Chief Complaint  Patient presents with  . Back Pain    pt states she has had DDD and lower back pain for years, sitting is better than standing per patient. Has taken OTC pain relievers, not much relief    BACK PAIN Previously followed by pain management, but can not afford this at this time.  Has taken Soma in past for pain and has taken opioid, Oxycodone.  Did injections with pain clinic too, but reports no benefit from these.  Degenerative disc L4-L5 on previous imaging February 2019, severe right hip OA present and left joint space is maintained. Dr. Holley Raring last seen in 2019.  Would like a muscle relaxer to help with discomfort.  Uses walker at baseline.  Is on Duloxetine for mood, which also may benefit her pain. Duration: chronic Mechanism of injury: unknown Location: midline and low back Onset: gradual Severity: moderate Quality: sharp, dull and aching Frequency: intermittent Radiation: R leg below the knee Aggravating factors: lifting, movement, walking and bending Alleviating factors: rest, heat, APAP and muscle relaxer Status: fluctuating Treatments attempted: rest, heat and APAP  Relief with NSAIDs?: no Nighttime pain:  yes Paresthesias / decreased sensation:  no Bowel / bladder incontinence:  no Fevers:  no Dysuria / urinary frequency:  no  Relevant past medical, surgical, family and social history reviewed and updated as indicated. Interim medical history since our last visit reviewed. Allergies and medications reviewed and updated.  Review of Systems  Constitutional: Negative for activity change, appetite change, diaphoresis, fatigue and fever.  Respiratory: Negative for cough, chest tightness and shortness of breath.     Cardiovascular: Negative for chest pain, palpitations and leg swelling.  Musculoskeletal: Positive for back pain.  Neurological: Negative for dizziness, syncope, weakness, light-headedness, numbness and headaches.  Psychiatric/Behavioral: Negative.     Per HPI unless specifically indicated above     Objective:    BP 92/61   Pulse 85   Temp 98.7 F (37.1 C) (Oral)   LMP  (LMP Unknown)   SpO2 95%   Wt Readings from Last 3 Encounters:  09/29/19 266 lb (120.7 kg)  04/05/19 254 lb (115.2 kg)  01/17/19 254 lb (115.2 kg)    Physical Exam Vitals and nursing note reviewed.  Constitutional:      General: She is awake. She is not in acute distress.    Appearance: She is well-developed. She is morbidly obese. She is not ill-appearing.  HENT:     Head: Normocephalic.     Right Ear: Hearing normal.     Left Ear: Hearing normal.  Eyes:     General: Lids are normal.        Right eye: No discharge.        Left eye: No discharge.     Conjunctiva/sclera: Conjunctivae normal.     Pupils: Pupils are equal, round, and reactive to light.  Neck:     Vascular: No carotid bruit.  Cardiovascular:     Rate and Rhythm: Normal rate and regular rhythm.     Heart sounds: Normal heart sounds. No murmur. No gallop.   Pulmonary:     Effort: Pulmonary effort is  normal. No accessory muscle usage or respiratory distress.     Breath sounds: Normal breath sounds.  Abdominal:     General: Bowel sounds are normal.     Palpations: Abdomen is soft.  Musculoskeletal:     Cervical back: Normal range of motion and neck supple.     Lumbar back: Tenderness present. No swelling, lacerations or spasms. Decreased range of motion.     Right lower leg: No edema.     Left lower leg: No edema.  Skin:    General: Skin is warm and dry.  Neurological:     Mental Status: She is alert and oriented to person, place, and time.  Psychiatric:        Attention and Perception: Attention normal.        Mood and Affect:  Mood normal.        Behavior: Behavior normal. Behavior is cooperative.        Thought Content: Thought content normal.        Judgment: Judgment normal.    Back Exam:    Inspection:  Normal spinal curvature.  No deformity, ecchymosis, erythema, or lesions  Curvature: Normal   Deformity: no  Ecchymosis: none  Erythema: none  Lesions: no    Palpation:     Midline spinal tenderness: no none    Paralumbar tenderness: yes bilateral     Parathoracic tenderness: no none     Buttocks tenderness: nonone     Range of Motion:      Flexion: Fingers to Knees     Extension:Decreased     Lateral bending:Decreased    Rotation:Decreased    Neuro Exam:Lower extremity DTRs normal & symmetric.  Strength and sensation intact.     Patellar DTRs: Normal     Ankle dorsiflexion strength:Within Normal Limits     Sensation(medial malleolus):Within Normal Limits     Great toe dorsiflexion strength: Within Normal Limits     Sensation (mid dorsal foot):Within Normal Limits     Ankle DTRs: Normal     Ankle plantar flexion strength:Within Normal Limits     Sensation (lateral heel):Within Normal Limits    Special Tests:      Straight leg raise:negative     Crossed straight leg raise:negative  Results for orders placed or performed in visit on 09/04/19  Comprehensive metabolic panel  Result Value Ref Range   Glucose 94 65 - 99 mg/dL   BUN 16 8 - 27 mg/dL   Creatinine, Ser 1.12 (H) 0.57 - 1.00 mg/dL   GFR calc non Af Amer 52 (L) >59 mL/min/1.73   GFR calc Af Amer 60 >59 mL/min/1.73   BUN/Creatinine Ratio 14 12 - 28   Sodium 140 134 - 144 mmol/L   Potassium 5.1 3.5 - 5.2 mmol/L   Chloride 105 96 - 106 mmol/L   CO2 22 20 - 29 mmol/L   Calcium 9.5 8.7 - 10.3 mg/dL   Total Protein 6.9 6.0 - 8.5 g/dL   Albumin 4.1 3.8 - 4.8 g/dL   Globulin, Total 2.8 1.5 - 4.5 g/dL   Albumin/Globulin Ratio 1.5 1.2 - 2.2   Bilirubin Total <0.2 0.0 - 1.2 mg/dL   Alkaline Phosphatase 130 (H) 39 - 117 IU/L   AST 23 0 -  40 IU/L   ALT 20 0 - 32 IU/L  Lipid Panel w/o Chol/HDL Ratio  Result Value Ref Range   Cholesterol, Total 175 100 - 199 mg/dL   Triglycerides 131 0 - 149 mg/dL  HDL 61 >39 mg/dL   VLDL Cholesterol Cal 23 5 - 40 mg/dL   LDL Chol Calc (NIH) 91 0 - 99 mg/dL  VITAMIN D 25 Hydroxy (Vit-D Deficiency, Fractures)  Result Value Ref Range   Vit D, 25-Hydroxy 44.7 30.0 - 100.0 ng/mL  Magnesium  Result Value Ref Range   Magnesium 1.7 1.6 - 2.3 mg/dL  HgB A1c  Result Value Ref Range   Hgb A1c MFr Bld 6.0 (H) 4.8 - 5.6 %   Est. average glucose Bld gHb Est-mCnc 126 mg/dL      Assessment & Plan:   Problem List Items Addressed This Visit      Other   Chronic pain    Chronic, ongoing with no current pain clinic.  Multi factorial due to morbid obesity and co morbidities.  Would benefit from return to pain clinic, but does not wish to return at this time.  Agrees to return to different pain clinic in future if needed.  Would also benefit from PT time.  Tizanidine script sent.  Recommend use of Tylenol as needed + Lidocaine patches or Voltaren gel.  Apply heat to back as needed.  Return in 4 weeks for follow-up.      Relevant Medications   DULoxetine (CYMBALTA) 60 MG capsule   tiZANidine (ZANAFLEX) 4 MG capsule       Follow up plan: Return in about 4 weeks (around 02/19/2020) for Back Pain, CKD, HTN/HLD, Prediabetes.

## 2020-02-19 ENCOUNTER — Ambulatory Visit: Payer: BC Managed Care – PPO | Admitting: Nurse Practitioner

## 2020-02-20 ENCOUNTER — Encounter: Payer: Self-pay | Admitting: Nurse Practitioner

## 2020-02-26 ENCOUNTER — Telehealth: Payer: Self-pay | Admitting: Nurse Practitioner

## 2020-02-26 NOTE — Telephone Encounter (Signed)
Email to [facility]  From: Jill Alexanders [facility])  Sent: Monday, February 26, 2020 4:30 PM To: Burnett Harry [email or web address]> Subject: ECC Application - Emma-Lee Sedillo - 02/26/20  Good Morning Jenny Reichmann, I have completed the application for Ms. Kayleen Memos on [employer] site.  Thank you as always for your help!    [place] . [email or web address]  [phone]

## 2020-02-26 NOTE — Telephone Encounter (Signed)
Email to PT  From: Jill Alexanders [facility])  Sent: Monday, February 26, 2020 4:28 PM To: [email or web address] Subject: SECURE: [place] Program  Good Afternoon Ms. Kayleen Memos, Please see attached list of food banks and the meal calendar through [employer]. I have also attached the [facility] Application.  Please let me know if you need anything further,   [email or web address]   [id number]

## 2020-02-26 NOTE — Telephone Encounter (Signed)
   KNB 02/26/2020 1st Attempt  Name: [name]   MRN: [id number]   DOB: [birth year]   AGE: 64 y.o.   GENDER: female   PCP Venita Lick, NP.   Called pt regarding Community Resource Referral for [facility] Follow up on: 02/27/20  Notes: Patient notes that she cannot afford to see Pain Management. Notes that her other expenses, specifically mentioning grocery bills, have gotten more expensive lately. Notes that she applied for Medicaid, but made $80 too much. Any help with anything so that we can get her to Pain Management would be greatly appreciated! Notes that she cannot afford Pain Management right now, due to multiple other expenses. Reports that her grocery bill has "doubled" since the start of the pandemic. Was denied for Medicaid d/t "making $80 too much"  [place] . [email or web address]  [phone]

## 2020-02-26 NOTE — Telephone Encounter (Signed)
Email to [facility]

## 2020-02-26 NOTE — Telephone Encounter (Signed)
   KNB 02/26/2020 Name: [name]   MRN: [id number]   DOB: 10-May-1956   AGE: 64 y.o.   GENDER: female   PCP Venita Lick, NP.   Returned pt's c/b regarding [name] Referral.  Medicaid Pt stated that she had received a letter from [facility] that she had been approved but was a little confused by the language in regards to statement regarding COVID-19. Hel[address] conf call with pt and DSS case worker Sheppard Evens [phone] Ms. Tamala Julian was not available left voicemail to c/b [employer]. Pt stated that her COBRA Cvg thru [place] will be expiring soon that she had from her husbands employer.  Financial Assistance  She is concerned about paying for hospital bill through [facility] owes over $2000 now, Care Guide will email charity care application for her.  Food Assistance/Prepared Meals Pt declined referral to [employer] but was interested in other [employer] for [employer], [place] will email [facility] food handout for her to review.  Grocery Bill: Grocery bill has "doubled" since the start of the pandemic.Care Guide will apply for assistance with [facility] for Food [facility] Card  Other Notes: Ms. Blaum informed me that her son is living with her now and is her primary caregiver she is still able to bathe herself but she said she is in severe pain and cannot hardly make a pot of coffee. Her son age 61, is preparing the meals for her but is also on disability. Her husband passed in 2018    Follow up with [facility] app on: 02/28/20  [place] . [email or web address]  [phone]

## 2020-02-27 NOTE — Telephone Encounter (Signed)
   KNB 02/27/2020  Name: [name]   MRN: [id number]   DOB: [birth year]   AGE: 64 y.o.   GENDER: female   PCP Venita Lick, NP.   Called pt regarding pick up and she stated that she would be able to come tomorrow between 9 and 11am will notify Shelby Mattocks at [facility]. Closing referral pending any other needs of patient.   [place] . [email or web address]  [phone]

## 2020-02-27 NOTE — Telephone Encounter (Signed)
° °  KNB [id number] LMTCB Application approved coordinate pick up    Name: [name]    MRN: [id number]    DOB: Apr 04, 1956    AGE: 64 y.o.    GENDER: female    PCP Venita Lick, NP.   Called pt regarding Community Resource Referral for coordinating pick up Grocery store gift card pick up  Follow up on: 02/28/20  [place] Management [email or web address]   [id number]

## 2020-03-04 ENCOUNTER — Encounter: Payer: Self-pay | Admitting: Unknown Physician Specialty

## 2020-03-04 ENCOUNTER — Other Ambulatory Visit: Payer: Self-pay | Admitting: Nurse Practitioner

## 2020-03-04 ENCOUNTER — Ambulatory Visit (INDEPENDENT_AMBULATORY_CARE_PROVIDER_SITE_OTHER): Payer: BC Managed Care – PPO | Admitting: Unknown Physician Specialty

## 2020-03-04 ENCOUNTER — Other Ambulatory Visit: Payer: Self-pay

## 2020-03-04 DIAGNOSIS — E782 Mixed hyperlipidemia: Secondary | ICD-10-CM | POA: Diagnosis not present

## 2020-03-04 DIAGNOSIS — I10 Essential (primary) hypertension: Secondary | ICD-10-CM | POA: Diagnosis not present

## 2020-03-04 DIAGNOSIS — N1831 Chronic kidney disease, stage 3a: Secondary | ICD-10-CM

## 2020-03-04 DIAGNOSIS — F17210 Nicotine dependence, cigarettes, uncomplicated: Secondary | ICD-10-CM

## 2020-03-04 DIAGNOSIS — F324 Major depressive disorder, single episode, in partial remission: Secondary | ICD-10-CM | POA: Diagnosis not present

## 2020-03-04 LAB — BAYER DCA HB A1C WAIVED: HB A1C (BAYER DCA - WAIVED): 5.6 % (ref <7.0)

## 2020-03-04 NOTE — Telephone Encounter (Signed)
Requested Prescriptions  Pending Prescriptions Disp Refills  . busPIRone (BUSPAR) 10 MG tablet [Pharmacy Med Name: busPIRone HCl 10 MG Oral Tablet] 270 tablet 0    Sig: TAKE 1 TABLET BY MOUTH THREE TIMES DAILY     Psychiatry: Anxiolytics/Hypnotics - Non-controlled Passed - 03/04/2020 12:33 AM      Passed - Valid encounter within last 6 months    Recent Outpatient Visits          1 month ago Chronic pain syndrome   [place], Henrine Screws T, NP   6 months ago COPD, severe ([place])   [place] Cannady, Jolene T, NP   11 months ago Depression, major, single episode, in partial remission ([place])   [place], [place] T, NP   1 year ago COPD, severe ([place])   [place], Barbaraann Faster, NP   1 year ago Benign hypertension   [place], Barbaraann Faster, NP      Future Appointments            Today Cannady, Barbaraann Faster, NP [facility], [facility]           . hydrOXYzine (ATARAX/VISTARIL) 10 MG tablet [Pharmacy Med Name: hydrOXYzine HCl 10 MG Oral Tablet] 90 tablet 0    Sig: Take 1 tablet by mouth three times daily as needed     Ear, Nose, and Throat:  Antihistamines Passed - 03/04/2020 12:33 AM      Passed - Valid encounter within last 12 months    Recent Outpatient Visits          1 month ago Chronic pain syndrome   [place], Jolene T, NP   6 months ago COPD, severe ([place])   [place] Cannady, Jolene T, NP   11 months ago Depression, major, single episode, in partial remission ([place])   [place], [place] T, NP   1 year ago COPD, severe ([place])   [place], Barbaraann Faster, NP   1 year ago Benign hypertension   [place], Barbaraann Faster, NP      Future Appointments            Today Venita Lick, NP [place], [facility]

## 2020-03-04 NOTE — Assessment & Plan Note (Signed)
Encouraged to quit.  Pt had LDCT last fall

## 2020-03-04 NOTE — Assessment & Plan Note (Signed)
Stable, continue present medications.   

## 2020-03-04 NOTE — Assessment & Plan Note (Signed)
GFR in the 50's last visit.  Recheck today.  Reminded to avoid NSAIDs

## 2020-03-04 NOTE — Progress Notes (Signed)
BP 126/72   Pulse 92   Temp 98.9 F (37.2 C) (Oral)   LMP  (LMP Unknown)   SpO2 95%    Subjective:    Patient ID: [name], female    DOB: Sep 16, 1956, 64 y.o.   MRN: [id number]  HPI: [name] is a 64 y.o. female  Chief Complaint  Patient presents with  . Depression  . Diabetes  . Hyperlipidemia  . Hypertension   Chronic pain Added Tizanidine TID last visit and added to Neurontin and Cymbalta.  This is going well.  Unable to afford the pain clinic.  For now, she is doing OK  Hypertension Using medications without difficulty Average home BPs:not checking   No problems or lightheadedness No chest pain with exertion or shortness of breath No Edema   Hyperlipidemia Using medications without problems: No Muscle aches  Diet compliance: Watches what she eats Exercise:none  Last Hgb A1C was 6.0  Depression screen [facility] 2/9 03/04/2020 04/05/2019 12/30/2018 09/28/2018 06/28/2018  Decreased Interest 0 0 0 0 1  Down, Depressed, Hopeless 0 0 0 0 1  PHQ - 2 Score 0 0 0 0 2  Altered sleeping 1 0 1 0 0  Tired, decreased energy 0 0 1 0 0  Change in appetite 0 0 - 0 0  Feeling bad or failure about yourself  0 0 0 0 0  Trouble concentrating 0 0 0 - 1  Moving slowly or fidgety/restless 0 0 0 0 0  Suicidal thoughts 0 0 0 0 0  PHQ-9 Score 1 0 2 0 3  Difficult doing work/chores Not difficult at all - Not difficult at all - -  Some recent data might be hidden     Relevant past medical, surgical, family and social history reviewed and updated as indicated. Interim medical history since our last visit reviewed. Allergies and medications reviewed and updated.  Review of Systems  Per HPI unless specifically indicated above     Objective:    BP 126/72   Pulse 92   Temp 98.9 F (37.2 C) (Oral)   LMP  (LMP Unknown)   SpO2 95%   Wt Readings from Last 3 Encounters:  09/29/19 266 lb (120.7 kg)  04/05/19 254 lb (115.2 kg)  01/17/19 254 lb (115.2 kg)    Physical  Exam Constitutional:      Appearance: She is well-developed.  HENT:     Head: Normocephalic and atraumatic.  Eyes:     General: No scleral icterus.       Right eye: No discharge.        Left eye: No discharge.     Pupils: Pupils are equal, round, and reactive to light.  Neck:     Thyroid: No thyromegaly.     Vascular: No carotid bruit.  Cardiovascular:     Rate and Rhythm: Normal rate and regular rhythm.     Heart sounds: Normal heart sounds. No murmur. No friction rub. No gallop.   Pulmonary:     Effort: Pulmonary effort is normal. No respiratory distress.     Breath sounds: Normal breath sounds. No wheezing or rales.  Abdominal:     General: Bowel sounds are normal.     Palpations: Abdomen is soft.     Tenderness: There is no abdominal tenderness. There is no rebound.  Musculoskeletal:     Cervical back: Normal range of motion and neck supple.  Lymphadenopathy:     Cervical: No cervical adenopathy.  Skin:  General: Skin is warm and dry.     Findings: No rash.  Neurological:     Mental Status: She is alert and oriented to person, place, and time.  Psychiatric:        Speech: Speech normal.        Behavior: Behavior normal.        Thought Content: Thought content normal.        Judgment: Judgment normal.     Results for orders placed or performed in visit on 09/04/19  Comprehensive metabolic panel  Result Value Ref Range   Glucose 94 65 - 99 mg/dL   BUN 16 8 - 27 mg/dL   Creatinine, Ser 1.12 (H) 0.57 - 1.00 mg/dL   GFR calc non Af Amer 52 (L) >59 mL/min/1.73   GFR calc Af Amer 60 >59 mL/min/1.73   BUN/Creatinine Ratio 14 12 - 28   Sodium 140 134 - 144 mmol/L   Potassium 5.1 3.5 - 5.2 mmol/L   Chloride 105 96 - 106 mmol/L   CO2 22 20 - 29 mmol/L   Calcium 9.5 8.7 - 10.3 mg/dL   Total Protein 6.9 6.0 - 8.5 g/dL   Albumin 4.1 3.8 - 4.8 g/dL   Globulin, Total 2.8 1.5 - 4.5 g/dL   Albumin/Globulin Ratio 1.5 1.2 - 2.2   Bilirubin Total <0.2 0.0 - 1.2 mg/dL    Alkaline Phosphatase 130 (H) 39 - 117 IU/L   AST 23 0 - 40 IU/L   ALT 20 0 - 32 IU/L  Lipid Panel w/o Chol/HDL Ratio  Result Value Ref Range   Cholesterol, Total 175 100 - 199 mg/dL   Triglycerides 131 0 - 149 mg/dL   HDL 61 >39 mg/dL   VLDL Cholesterol Cal 23 5 - 40 mg/dL   LDL Chol Calc (NIH) 91 0 - 99 mg/dL  VITAMIN D 25 Hydroxy (Vit-D Deficiency, Fractures)  Result Value Ref Range   Vit D, 25-Hydroxy 44.7 30.0 - 100.0 ng/mL  Magnesium  Result Value Ref Range   Magnesium 1.7 1.6 - 2.3 mg/dL  HgB A1c  Result Value Ref Range   Hgb A1c MFr Bld 6.0 (H) 4.8 - 5.6 %   Est. average glucose Bld gHb Est-mCnc 126 mg/dL      Assessment & Plan:   Problem List Items Addressed This Visit      Unprioritized   Benign hypertension    Stable, continue present medications.        Relevant Orders   Comprehensive metabolic panel   Chronic kidney disease, stage 3a    GFR in the 50's last visit.  Recheck today.  Reminded to avoid NSAIDs      Relevant Orders   Comprehensive metabolic panel   Bayer DCA Hb A1c Waived   Depression, major, single episode, in partial remission (HCC)    Stable, continue present medications.        Hyperlipidemia    Stable, continue present medications.        Nicotine dependence, cigarettes, uncomplicated    Encouraged to quit.  Pt had LDCT last fall          Follow up plan: No follow-ups on file.

## 2020-03-05 ENCOUNTER — Encounter: Payer: Self-pay | Admitting: Unknown Physician Specialty

## 2020-03-05 LAB — COMPREHENSIVE METABOLIC PANEL
ALT: 17 IU/L (ref 0–32)
AST: 21 IU/L (ref 0–40)
Albumin/Globulin Ratio: 1.4 (ref 1.2–2.2)
Albumin: 4 g/dL (ref 3.8–4.8)
Alkaline Phosphatase: 137 IU/L — ABNORMAL HIGH (ref 39–117)
BUN/Creatinine Ratio: 13 (ref 12–28)
BUN: 13 mg/dL (ref 8–27)
Bilirubin Total: 0.2 mg/dL (ref 0.0–1.2)
CO2: 21 mmol/L (ref 20–29)
Calcium: 9.2 mg/dL (ref 8.7–10.3)
Chloride: 103 mmol/L (ref 96–106)
Creatinine, Ser: 1 mg/dL (ref 0.57–1.00)
GFR calc Af Amer: 69 mL/min/{1.73_m2} (ref >59)
GFR calc non Af Amer: 60 mL/min/{1.73_m2} (ref >59)
Globulin, Total: 2.9 g/dL (ref 1.5–4.5)
Glucose: 123 mg/dL — ABNORMAL HIGH (ref 65–99)
Potassium: 4.8 mmol/L (ref 3.5–5.2)
Sodium: 141 mmol/L (ref 134–144)
Total Protein: 6.9 g/dL (ref 6.0–8.5)

## 2020-03-06 ENCOUNTER — Encounter: Payer: Self-pay | Admitting: Nurse Practitioner

## 2020-03-08 ENCOUNTER — Encounter: Payer: Self-pay | Admitting: Nurse Practitioner

## 2020-03-15 ENCOUNTER — Encounter: Payer: Self-pay | Admitting: Nurse Practitioner

## 2020-03-15 ENCOUNTER — Telehealth (INDEPENDENT_AMBULATORY_CARE_PROVIDER_SITE_OTHER): Payer: BC Managed Care – PPO | Admitting: Nurse Practitioner

## 2020-03-15 ENCOUNTER — Other Ambulatory Visit: Payer: Self-pay

## 2020-03-15 DIAGNOSIS — G4733 Obstructive sleep apnea (adult) (pediatric): Secondary | ICD-10-CM | POA: Diagnosis not present

## 2020-03-15 NOTE — Patient Instructions (Signed)

## 2020-03-15 NOTE — Progress Notes (Signed)
BP (!) 164/71 (BP Location: Left Arm, Patient Position: Sitting, Cuff Size: Normal)   Temp 98 F (36.7 C) (Temporal)   LMP  (LMP Unknown)    Subjective:    Patient ID: [name], female    DOB: 11-Aug-1956, 64 y.o.   MRN: [id number]  HPI: [name] is a 64 y.o. female  Chief Complaint  Patient presents with  . Sleep Apnea    Discussion for CPAP    . This visit was completed via MyChart due to the restrictions of the COVID-19 pandemic. All issues as above were discussed and addressed. Physical exam was done as above through visual confirmation on MyChart. If it was felt that the patient should be evaluated in the office, they were directed there. The patient verbally consented to this visit. . Location of the patient: home . Location of the provider: work . Those involved with this call:  . Provider: Marnee Guarneri, DNP . CMA: Yvonna Alanis, CMA . Front Desk/Registration: Don Perking  . Time spent on call: 15 minutes with patient face to face via video conference. More than 50% of this time was spent in counseling and coordination of care. 10 minutes total spent in review of patient's record and preparation of their chart.  . I verified patient identity using two factors (patient name and date of birth). Patient consents verbally to being seen via telemedicine visit today.    SLEEP APNEA Presents for visit today as is in need of new CPAP supplies.  She continues on CPAP, she uses this every night for 8 hours a night.  100% use of CPAP, does not miss a night.  She has used a CPAP for  > 10 years.  Her setting is 20 on her CPAP.  The CPAP has offered great benefit to the patient as she has underlying OSA and obesity, placing her at risk with her apneic spells when she sleeps.  The CPAP offers overall health benefits, including benefits to her lung health with COPD and her blood pressure with HTN.   Sleep apnea status: stable Duration: chronic Satisfied with  current treatment?:  yes CPAP use:  yes Sleep quality with CPAP use: excellent Treament compliance:excellent compliance Last sleep study: at [facility] over 10 years ago Treatments attempted: CPAP Wakes feeling refreshed:  yes Daytime hypersomnolence:  no Fatigue:  no Insomnia:  no Good sleep hygiene:  yes Difficulty falling asleep:  no Difficulty staying asleep:  no Snoring bothers bed partner:  no Observed apnea by bed partner: yes Obesity:  yes Hypertension: yes  Pulmonary hypertension:  no Coronary artery disease:  no  Relevant past medical, surgical, family and social history reviewed and updated as indicated. Interim medical history since our last visit reviewed. Allergies and medications reviewed and updated.  Review of Systems  Constitutional: Negative for activity change, appetite change, diaphoresis, fatigue and fever.  Respiratory: Negative for cough, chest tightness and shortness of breath.   Cardiovascular: Negative for chest pain, palpitations and leg swelling.  Gastrointestinal: Negative.   Neurological: Negative.   Psychiatric/Behavioral: Negative.     Per HPI unless specifically indicated above     Objective:    BP (!) 164/71 (BP Location: Left Arm, Patient Position: Sitting, Cuff Size: Normal)   Temp 98 F (36.7 C) (Temporal)   LMP  (LMP Unknown)   Wt Readings from Last 3 Encounters:  09/29/19 266 lb (120.7 kg)  04/05/19 254 lb (115.2 kg)  01/17/19 254 lb (115.2 kg)  Physical Exam Vitals and nursing note reviewed.  Constitutional:      General: She is awake. She is not in acute distress.    Appearance: She is well-developed and well-groomed. She is obese. She is not ill-appearing.  HENT:     Head: Normocephalic.     Right Ear: Hearing normal.     Left Ear: Hearing normal.  Eyes:     General: Lids are normal.        Right eye: No discharge.        Left eye: No discharge.     Conjunctiva/sclera: Conjunctivae normal.  Pulmonary:     Effort:  Pulmonary effort is normal. No accessory muscle usage or respiratory distress.  Musculoskeletal:     Cervical back: Normal range of motion.  Neurological:     Mental Status: She is alert and oriented to person, place, and time.  Psychiatric:        Attention and Perception: Attention normal.        Mood and Affect: Mood normal.        Behavior: Behavior normal. Behavior is cooperative.        Thought Content: Thought content normal.        Judgment: Judgment normal.     Results for orders placed or performed in visit on 03/04/20  Comprehensive metabolic panel  Result Value Ref Range   Glucose 123 (H) 65 - 99 mg/dL   BUN 13 8 - 27 mg/dL   Creatinine, Ser 1.00 0.57 - 1.00 mg/dL   GFR calc non Af Amer 60 >59 mL/min/1.73   GFR calc Af Amer 69 >59 mL/min/1.73   BUN/Creatinine Ratio 13 12 - 28   Sodium 141 134 - 144 mmol/L   Potassium 4.8 3.5 - 5.2 mmol/L   Chloride 103 96 - 106 mmol/L   CO2 21 20 - 29 mmol/L   Calcium 9.2 8.7 - 10.3 mg/dL   Total Protein 6.9 6.0 - 8.5 g/dL   Albumin 4.0 3.8 - 4.8 g/dL   Globulin, Total 2.9 1.5 - 4.5 g/dL   Albumin/Globulin Ratio 1.4 1.2 - 2.2   Bilirubin Total 0.2 0.0 - 1.2 mg/dL   Alkaline Phosphatase 137 (H) 39 - 117 IU/L   AST 21 0 - 40 IU/L   ALT 17 0 - 32 IU/L  Bayer DCA Hb A1c Waived  Result Value Ref Range   HB A1C (BAYER DCA - WAIVED) 5.6 <7.0 %      Assessment & Plan:   Problem List Items Addressed This Visit      Respiratory   Sleep apnea    Chronic, ongoing.  She is in need of new supplies.  Her current CPAP setting is 20.  She uses her CPAP 100% of the time, using it every night for 8 hours a night.  She is very dedicated to her CPAP use, which benefits her overall health, including her chronic conditions such as COPD, HTN, obesity.  Continue 100% CPAP use and assist in obtaining new supplies.         I discussed the assessment and treatment plan with the patient. The patient was provided an opportunity to ask questions and  all were answered. The patient agreed with the plan and demonstrated an understanding of the instructions.   The patient was advised to call back or seek an in-person evaluation if the symptoms worsen or if the condition fails to improve as anticipated.   I provided 15+ minutes of time during this  encounter.  Follow up plan: Return if symptoms worsen or fail to improve.

## 2020-03-15 NOTE — Assessment & Plan Note (Signed)
Chronic, ongoing.  She is in need of new supplies.  Her current CPAP setting is 20.  She uses her CPAP 100% of the time, using it every night for 8 hours a night.  She is very dedicated to her CPAP use, which benefits her overall health, including her chronic conditions such as COPD, HTN, obesity.  Continue 100% CPAP use and assist in obtaining new supplies.

## 2020-03-19 ENCOUNTER — Ambulatory Visit: Payer: Self-pay | Admitting: Pharmacist

## 2020-03-19 DIAGNOSIS — E782 Mixed hyperlipidemia: Secondary | ICD-10-CM

## 2020-03-19 DIAGNOSIS — M5136 Other intervertebral disc degeneration, lumbar region: Secondary | ICD-10-CM

## 2020-03-19 DIAGNOSIS — F324 Major depressive disorder, single episode, in partial remission: Secondary | ICD-10-CM

## 2020-03-19 DIAGNOSIS — I10 Essential (primary) hypertension: Secondary | ICD-10-CM

## 2020-03-19 NOTE — Chronic Care Management (AMB) (Signed)
Chronic Care Management   Follow Up Note   03/19/2020 Name: [name] MRN: [id number] DOB: 1956/04/19  Referred by: Venita Lick, NP Reason for referral : Chronic Care Management (Medication Management)   [name] is a 64 y.o. year old female who is a primary care patient of Cannady, Barbaraann Faster, NP. The CCM team was consulted for assistance with chronic disease management and care coordination needs.    Contacted patient for medication management review.   Review of patient status, including review of consultants reports, relevant laboratory and other test results, and collaboration with appropriate care team members and the patient's provider was performed as part of comprehensive patient evaluation and provision of chronic care management services.    SDOH (Social Determinants of Health) assessments performed: No See Care Plan activities for detailed interventions related to [facility])     Outpatient Encounter Medications as of 03/19/2020  Medication Sig Note  . acetaminophen (TYLENOL) 500 MG tablet Take 1,000 mg by mouth in the morning and at bedtime.   Marland Kitchen ascorbic acid (VITAMIN C) 500 MG tablet Take 500 mg by mouth daily.   Marland Kitchen aspirin 81 MG tablet Take 81 mg by mouth daily.   Marland Kitchen atorvastatin (LIPITOR) 40 MG tablet Take 1 tablet (40 mg total) by mouth daily.   . busPIRone (BUSPAR) 10 MG tablet TAKE 1 TABLET BY MOUTH THREE TIMES DAILY   . Calcium Citrate-Vitamin D (CALCIUM + D PO) Take 1,000 mg by mouth daily.   . DULoxetine (CYMBALTA) 60 MG capsule Take 1 capsule (60 mg total) by mouth daily.   . furosemide (LASIX) 20 MG tablet Take 1 tablet (20 mg total) by mouth daily.   Marland Kitchen gabapentin (NEURONTIN) 800 MG tablet Take 1 tablet (800 mg total) by mouth 3 (three) times daily.   . hydrOXYzine (ATARAX/VISTARIL) 10 MG tablet Take 1 tablet by mouth three times daily as needed 03/19/2020: Using TID  . lansoprazole (PREVACID) 15 MG capsule Take 15 mg by mouth daily.   . metFORMIN  (GLUCOPHAGE) 500 MG tablet TAKE 1 TABLET BY MOUTH THREE TIMES DAILY WITH  MEALS   . Multiple Vitamins-Minerals (MULTIVITAMIN WITH MINERALS) tablet Take 1 tablet by mouth daily.   . potassium chloride SA (KLOR-CON) 20 MEQ tablet TAKE 1  BY MOUTH ONCE DAILY   . telmisartan-hydrochlorothiazide (MICARDIS HCT) 40-12.5 MG tablet Take 1 tablet by mouth daily.   . Tiotropium Bromide Monohydrate (SPIRIVA RESPIMAT) 2.5 MCG/ACT AERS Inhale 2 puffs into the lungs daily.   Marland Kitchen tiZANidine (ZANAFLEX) 4 MG capsule Take 1 capsule (4 mg total) by mouth 3 (three) times daily.   . vitamin B-12 (CYANOCOBALAMIN) 1000 MCG tablet Take 1,000 mcg by mouth daily.   Marland Kitchen albuterol (PROAIR HFA) 108 (90 Base) MCG/ACT inhaler Inhale 2 puffs into the lungs every 6 (six) hours as needed for wheezing or shortness of breath. (Patient not taking: Reported on 03/19/2020)   . [DISCONTINUED] Aspirin-Acetaminophen-Caffeine (GOODYS EXTRA STRENGTH) 500-325-65 MG PACK Take 2 Units by mouth in the morning and at bedtime.   . [DISCONTINUED] atorvastatin (LIPITOR) 40 MG tablet Take 1 tablet by mouth once daily   . [DISCONTINUED] omeprazole (PRILOSEC) 40 MG capsule Take 1 capsule by mouth once daily   . [DISCONTINUED] potassium chloride SA (K-DUR,KLOR-CON) 20 MEQ tablet Take 1 tablet by mouth once daily    No facility-administered encounter medications on file as of 03/19/2020.     Objective:   Goals Addressed  This Visit's Progress     Patient Stated   . PharmD "I am in a lot of pain" (pt-stated)       CARE PLAN ENTRY (see longtitudinal plan of care for additional care plan information)  Current Barriers:  . Polypharmacy; complex patient with multiple comorbidities including pre-diabetes, chronic pain, depression, COPD, tobacco abuse . Financial concerns - collaborated w/ Care Guide. Patient over income for Medicaid. Biggest concern is inability to afford Pain Management provider.  o Chronic pain - Current medication regimen:  duloxetine 60 mg daily, gabapentin 800 mg TID, tizanidine 2 mg TID - reports she is taking TID regularly, and that this has helped significantly with her pain. Has stopped Goody powders since our last call.  - Stopped naproxen d/t lack of benefit, notes that ibuprofen impacted kidney function previously - Denies any benefit from lidoderm patches or Voltaren gel before  o Depression/anxiety: duloxetine 60 mg daily; buspirone 10 mg TID, hydroxyzine 10 mg TID; reports she had 2 panic attacks last week;  o T2DM: last A1c 5.6%, metformin 500 mg TID o ASCVD risk reduction: ASA 81 mg daily; atorvastatin 40 mg daily; last LDL at goal <100 o Acid reflux: lansoprazole 15 mg daily o HTN: telmisartan/HCTZ 40/12.5 mg QAM; furosemide 40 mg daily; BP at home: reports that it is "generally well controlled", but does not remember her recent readings. Notes that she needs to start writing down her readings.  o COPD: Spiriva Handihaler 2.5 mcg; 2 puffs daily; hasn't needed rescue albuterol recently.  Pharmacist Clinical Goal(s):  Marland Kitchen Over the next 90 days, patient will work with PharmD and provider towards optimized medication management  Interventions: . Comprehensive medication review performed, medication list updated in electronic medical record . Inter-disciplinary care team collaboration (see longitudinal plan of care) . Reviewed goal A1c, goal BP. Praised patient for continued control.  . Reviewed recent collaboration w/ Care Guide for financial resources. Reviewed upcoming call w/ LCSW for mental health and financial support. Will collaborate w/ LCSW for any options to help pursue pain management in an affordable way  Patient Self Care Activities:  . Patient will take medications as prescribed  Please see past updates related to this goal by clicking on the "Past Updates" button in the selected goal          Plan:  - Scheduled f/u call in ~ 12 weeks  Catie Darnelle Maffucci, PharmD, [place] [phone]

## 2020-03-19 NOTE — Patient Instructions (Signed)
Visit Information  Goals Addressed            This Visit's Progress     Patient Stated   . PharmD "I am in a lot of pain" (pt-stated)       CARE PLAN ENTRY (see longtitudinal plan of care for additional care plan information)  Current Barriers:  . Polypharmacy; complex patient with multiple comorbidities including pre-diabetes, chronic pain, depression, COPD, tobacco abuse . Financial concerns - collaborated w/ Care Guide. Patient over income for Medicaid. Biggest concern is inability to afford Pain Management provider.  o Chronic pain - Current medication regimen: duloxetine 60 mg daily, gabapentin 800 mg TID, tizanidine 2 mg TID - reports she is taking TID regularly, and that this has helped significantly with her pain. Has stopped Goody powders since our last call.  - Stopped naproxen d/t lack of benefit, notes that ibuprofen impacted kidney function previously - Denies any benefit from lidoderm patches or Voltaren gel before  o Depression/anxiety: duloxetine 60 mg daily; buspirone 10 mg TID, hydroxyzine 10 mg TID; reports she had 2 panic attacks last week;  o T2DM: last A1c 5.6%, metformin 500 mg TID o ASCVD risk reduction: ASA 81 mg daily; atorvastatin 40 mg daily; last LDL at goal <100 o Acid reflux: lansoprazole 15 mg daily o HTN: telmisartan/HCTZ 40/12.5 mg QAM; furosemide 40 mg daily; BP at home: reports that it is "generally well controlled", but does not remember her recent readings. Notes that she needs to start writing down her readings.  o COPD: Spiriva Handihaler 2.5 mcg; 2 puffs daily; hasn't needed rescue albuterol recently.  Pharmacist Clinical Goal(s):  Marland Kitchen Over the next 90 days, patient will work with PharmD and provider towards optimized medication management  Interventions: . Comprehensive medication review performed, medication list updated in electronic medical record . Inter-disciplinary care team collaboration (see longitudinal plan of care) . Reviewed goal  A1c, goal BP. Praised patient for continued control.  . Reviewed recent collaboration w/ Care Guide for financial resources. Reviewed upcoming call w/ LCSW for mental health and financial support. Will collaborate w/ LCSW for any options to help pursue pain management in an affordable way  Patient Self Care Activities:  . Patient will take medications as prescribed  Please see past updates related to this goal by clicking on the "Past Updates" button in the selected goal         Patient verbalizes understanding of instructions provided today.    Plan:  - Scheduled f/u call in ~ 12 weeks  Catie Darnelle Maffucci, PharmD, [place] [phone]

## 2020-03-27 ENCOUNTER — Ambulatory Visit: Payer: Self-pay

## 2020-03-27 NOTE — Chronic Care Management (AMB) (Signed)
  Care Management   Follow Up Note   03/27/2020 Name: [name] MRN: [id number] DOB: 1956/02/27  Referred by: Venita Lick, NP Reason for referral : Care Coordination   [name] is a 64 y.o. year old female who is a primary care patient of Cannady, Barbaraann Faster, NP. The care management team was consulted for assistance with care management and care coordination needs.    Review of patient status, including review of consultants reports, relevant laboratory and other test results, and collaboration with appropriate care team members and the patient's provider was performed as part of comprehensive patient evaluation and provision of chronic care management services.    LCSW completed CCM outreach attempt today but was unable to reach patient successfully. A HIPPA compliant voice message was left encouraging patient to return call once available. LCSW rescheduled CCM SW appointment as well.  A HIPPA compliant phone message was left for the patient providing contact information and requesting a return call.   Eula Fried, BSW, MSW, [facility] [email or web address] Phone: [phone]

## 2020-03-29 ENCOUNTER — Ambulatory Visit: Payer: Self-pay | Admitting: Licensed Clinical Social Worker

## 2020-03-29 NOTE — Chronic Care Management (AMB) (Addendum)
Care Management   Follow Up Note   03/29/2020 Name: [name] MRN: [id number] DOB: 1956-02-16  Referred by: Venita Lick, NP Reason for referral : Care Coordination   [name] is a 64 y.o. year old female who is a primary care patient of Cannady, Barbaraann Faster, NP. The care management team was consulted for assistance with care management and care coordination needs.    Review of patient status, including review of consultants reports, relevant laboratory and other test results, and collaboration with appropriate care team members and the patient's provider was performed as part of comprehensive patient evaluation and provision of chronic care management services.    SDOH (Social Determinants of Health) assessments performed: Yes See Care Plan activities for detailed interventions related to [facility])     Advanced Directives: See Care Plan and Vynca application for related entries.   Goals Addressed    . SW "I have panic attack sometimes." (pt-stated)       Current Barriers:  . Chronic Mental Health needs related to Panic Disorder and Depression . Financial constraints related to managing health care expenses . Limited social support . ADL IADL limitations . Mental Health Concerns  . Social Isolation . Inability to perform ADL's independently . Inability to perform IADL's independently . Suicidal Ideation/Homicidal Ideation: No  Clinical Social Work Goal(s):  Marland Kitchen Over the next 120 days, patient will work with SW  bi-monthly  by telephone or in person to reduce or manage symptoms related to anxiety and depression. . Over the next 120 days, patient will demonstrate improved health management independence as evidenced by implementing healthy self-care into her daily routine such as: attending all medical appointments, deep breathing exercsies, taking time for self-reflection, grounding exercises to combat panic attacks, taking medications as prescribed, drinking water and  daily exercise to improve mobility.  Interventions: . Patient interviewed and appropriate assessments performed: PHQ 2 and PHQ 9 . Patient reports having a strong support system of family members. She shares that her son lives with her and he is disabled but he is able to assist her when needed. Patient's sister and brother also come daily to check on patient which provides patient ongoing socialization.  . Provided mental health counseling with regard to panic disorder and depression. LCSW discussed coping skills for anxiety such a grounding exercises that she can use when panic arises. SW used empathetic and active and reflective listening, validated patient's feelings/concerns, and provided emotional support. LCSW provided self-care education to help manage her multiple health conditions and improve her mood.  . Provided patient with information about available mental health support within her nearby area. . Discussed plans with patient for ongoing care management follow up and provided patient with direct contact information for care management team . Assisted patient/caregiver with obtaining information about health plan benefits . Provided education and assistance to client regarding Advanced Directives. . Provided education to patient/caregiver about Hospice and/or Palliative Care services . Encouraged patient to consider a mental health provider for long term follow up and therapy/counseling. Patient has experienced many losses (one being her spouse 3 years ago to cancer) and would greatly benefit from virtual therapy through [facility]. Patient was informed that this service is free of charge. Resource information provided but patient declined at this time.  . Emotional/Supportive Counseling provided during session  Patient Self Care Activities:  . Calls provider office for new concerns or questions . Ability for insight . Independent living . Motivation for treatment . Strong  family or  social support  Patient Coping Strengths:  . Supportive Relationships . Family . Hopefulness . Self Advocate . Able to Communicate Effectively  Patient Self Care Deficits:  . Lacks social connections . Chronic Pain and poor mobility   Initial goal documentation     Depression screen [facility] 2/9 03/29/2020 03/04/2020 04/05/2019 12/30/2018 09/28/2018  Decreased Interest 0 0 0 0 0  Down, Depressed, Hopeless 0 0 0 0 0  PHQ - 2 Score 0 0 0 0 0  Altered sleeping 1 1 0 1 0  Tired, decreased energy 0 0 0 1 0  Change in appetite 0 0 0 - 0  Feeling bad or failure about yourself  0 0 0 0 0  Trouble concentrating 0 0 0 0 -  Moving slowly or fidgety/restless 0 0 0 0 0  Suicidal thoughts 0 0 0 0 0  PHQ-9 Score 1 1 0 2 0  Difficult doing work/chores Not difficult at all Not difficult at all - Not difficult at all -  Some recent data might be hidden   The care management team will reach out to the patient again over the next quarter.   Eula Fried, BSW, MSW, [facility]/THN Care Management [email or web address] Phone: [phone]

## 2020-04-08 ENCOUNTER — Other Ambulatory Visit: Payer: Self-pay | Admitting: Nurse Practitioner

## 2020-04-16 ENCOUNTER — Encounter: Payer: Self-pay | Admitting: Nurse Practitioner

## 2020-04-22 ENCOUNTER — Telehealth: Payer: Self-pay

## 2020-04-22 NOTE — Telephone Encounter (Signed)
Prior Authorization initiated via CoverMyMeds for Zanaflex 4MG  capsules Key: [id number]

## 2020-04-30 NOTE — Telephone Encounter (Signed)
PA denied. Fax for reasoning left in providers folder for review.

## 2020-05-02 ENCOUNTER — Other Ambulatory Visit: Payer: Self-pay | Admitting: Nurse Practitioner

## 2020-05-06 ENCOUNTER — Other Ambulatory Visit: Payer: Self-pay | Admitting: Nurse Practitioner

## 2020-05-16 ENCOUNTER — Other Ambulatory Visit: Payer: Self-pay | Admitting: Nurse Practitioner

## 2020-05-16 NOTE — Telephone Encounter (Signed)
Requested  medications are  due for refill today yes  Requested medications are on the active medication list yes  Last refill 04/18/20  Notes to clinic Not Delegated

## 2020-05-16 NOTE — Telephone Encounter (Signed)
Routing to provider  

## 2020-05-22 ENCOUNTER — Telehealth: Payer: Self-pay

## 2020-05-24 ENCOUNTER — Telehealth: Payer: Self-pay

## 2020-06-01 ENCOUNTER — Other Ambulatory Visit: Payer: Self-pay | Admitting: Nurse Practitioner

## 2020-06-01 NOTE — Telephone Encounter (Signed)
Requested Prescriptions  Pending Prescriptions Disp Refills  . [place] 2.5 MCG/ACT AERS [Pharmacy Med Name: Spiriva Respimat 2.5 MCG/ACT Inhalation Aerosol Solution] 4 g 5    Sig: INHALE 2 SPRAY(S) BY MOUTH ONCE DAILY     Pulmonology:  Anticholinergic Agents Passed - 06/01/2020  9:08 PM      Passed - Valid encounter within last 12 months    Recent Outpatient Visits          2 months ago Obstructive sleep apnea syndrome   [place], [place] T, NP   2 months ago Benign hypertension   [facility] [name], NP   4 months ago Chronic pain syndrome   [place], Henrine Screws T, NP   9 months ago COPD, severe ([place])   [place], [place] T, NP   1 year ago Depression, major, single episode, in partial remission ([place])   [place], Barbaraann Faster, NP      Future Appointments            In 3 months Cannady, Barbaraann Faster, NP [facility], [facility]

## 2020-06-07 ENCOUNTER — Ambulatory Visit: Payer: Self-pay

## 2020-06-07 NOTE — Chronic Care Management (AMB) (Signed)
  Care Management   Follow Up Note   06/07/2020 Name: [name] MRN: [id number] DOB: 1956-06-14  Referred by: Venita Lick, NP Reason for referral : Care Coordination   [name] is a 64 y.o. year old female who is a primary care patient of Cannady, Barbaraann Faster, NP. The care management team was consulted for assistance with care management and care coordination needs.    Review of patient status, including review of consultants reports, relevant laboratory and other test results, and collaboration with appropriate care team members and the patient's provider was performed as part of comprehensive patient evaluation and provision of chronic care management services.    LCSW completed CCM outreach attempt today but was unable to reach patient successfully. A HIPPA compliant voice message was left encouraging patient to return call once available. LCSW rescheduled CCM SW appointment as well.  A HIPPA compliant phone message was left for the patient providing contact information and requesting a return call.   Eula Fried, BSW, MSW, [facility]/THN [email or web address] Phone: [phone]

## 2020-06-10 ENCOUNTER — Other Ambulatory Visit: Payer: Self-pay | Admitting: Nurse Practitioner

## 2020-06-18 ENCOUNTER — Telehealth: Payer: Self-pay

## 2020-06-18 ENCOUNTER — Ambulatory Visit: Payer: Self-pay | Admitting: Pharmacist

## 2020-06-18 NOTE — Chronic Care Management (AMB) (Signed)
  Chronic Care Management   Note  06/18/2020 Name: [name] MRN: [id number] DOB: 1956/05/30  [name] is a 64 y.o. year old female who is a primary care patient of Cannady, Barbaraann Faster, NP. The CCM team was consulted for assistance with chronic disease management and care coordination needs.    Attempted to contact patient for medication management review. Left HIPAA compliant message for patient to return my call at their convenience.   Plan: - Will collaborate with Care Guide to outreach to schedule follow up with me   Catie Darnelle Maffucci, PharmD, [place] [phone]

## 2020-06-19 ENCOUNTER — Telehealth: Payer: Self-pay | Admitting: Nurse Practitioner

## 2020-06-19 NOTE — Chronic Care Management (AMB) (Signed)
  Care Management   Note  06/19/2020 Name: CALIA NAPP MRN: [id number] DOB: [birth year]  VERMA GROTHAUS is a 64 y.o. year old female who is a primary care patient of Venita Lick, NP and is actively engaged with the care management team. I reached out to [name] by phone today to assist with re-scheduling a follow up visit with the Pharmacist  Follow up plan: Unsuccessful telephone outreach attempt made. A HIPPA compliant phone message was left for the patient providing contact information and requesting a return call.  The care management team will reach out to the patient again over the next 7 days.  If patient returns call to provider office, please advise to call [facility]  at [place], [place], [place], [place] [address] Direct Dial: [phone] [email or web address] Website: [email or web address]

## 2020-06-24 NOTE — Chronic Care Management (AMB) (Signed)
  Care Management   Note  06/24/2020 Name: [name] MRN: [id number] DOB: [birth year]  [name] is a 64 y.o. year old female who is a primary care patient of Venita Lick, NP and is actively engaged with the care management team. I reached out to [name] by phone today to assist with re-scheduling a follow up visit with the Pharmacist  Follow up plan: Telephone appointment with care management team member scheduled for: 08/16/2020  Noreene Larsson, [place], [place], [place] [address] Direct Dial: [phone] [email or web address] Website: [email or web address]

## 2020-06-28 ENCOUNTER — Other Ambulatory Visit: Payer: Self-pay | Admitting: Nurse Practitioner

## 2020-07-08 ENCOUNTER — Other Ambulatory Visit: Payer: Self-pay | Admitting: Nurse Practitioner

## 2020-07-11 ENCOUNTER — Telehealth: Payer: Self-pay | Admitting: Nurse Practitioner

## 2020-07-11 NOTE — Telephone Encounter (Signed)
Pt's son Harrell Gave presented in office to drop of handicap placard form. Please call when ready.

## 2020-07-12 ENCOUNTER — Other Ambulatory Visit: Payer: Self-pay | Admitting: Nurse Practitioner

## 2020-07-12 NOTE — Telephone Encounter (Signed)
Form filled out and placed in providers folder for signature.  

## 2020-07-12 NOTE — Telephone Encounter (Signed)
Paperwork signed. Called and notified patient that it was ready to be picked up.

## 2020-07-27 ENCOUNTER — Other Ambulatory Visit: Payer: Self-pay | Admitting: Nurse Practitioner

## 2020-08-02 ENCOUNTER — Telehealth: Payer: Self-pay

## 2020-08-02 ENCOUNTER — Telehealth: Payer: Self-pay | Admitting: Licensed Clinical Social Worker

## 2020-08-02 NOTE — Chronic Care Management (AMB) (Signed)
  Care Management   Note  08/02/2020 Name: [name] MRN: [id number] DOB: [birth year]  [name] is a 64 y.o. year old female who is a primary care patient of Venita Lick, NP and is actively engaged with the care management team. I reached out to [name] by phone today to assist with re-scheduling a follow up visit with the Pharmacist  Follow up plan: Unsuccessful telephone outreach attempt made. A HIPPA compliant phone message was left for the patient providing contact information and requesting a return call.  The care management team will reach out to the patient again over the next 7 days.  If patient returns call to provider office, please advise to call [facility]  at [place], [place], [place], [place] [address] Direct Dial: [phone] [email or web address] Website: [email or web address]

## 2020-08-02 NOTE — Telephone Encounter (Signed)
  Chronic Care Management    Clinical Social Work General Follow Up Note  08/02/2020 Name: [name] MRN: [id number] DOB: July 27, 1956  [name] is a 64 y.o. year old female who is a primary care patient of Cannady, Barbaraann Faster, NP. The CCM team was consulted for assistance with [employer]  and [place] and [facility].   Review of patient status, including review of consultants reports, relevant laboratory and other test results, and collaboration with appropriate care team members and the patient's provider was performed as part of comprehensive patient evaluation and provision of chronic care management services.    LCSW completed CCM outreach attempt today but was unable to reach patient successfully. A HIPPA compliant voice message was left encouraging patient to return call once available. LCSW will route encounter to [place] who will reschedule CCM SW appointment as well.   Outpatient Encounter Medications as of 08/02/2020  Medication Sig  . acetaminophen (TYLENOL) 500 MG tablet Take 1,000 mg by mouth in the morning and at bedtime.  Marland Kitchen albuterol (PROAIR HFA) 108 (90 Base) MCG/ACT inhaler Inhale 2 puffs into the lungs every 6 (six) hours as needed for wheezing or shortness of breath. (Patient not taking: Reported on 03/19/2020)  . ascorbic acid (VITAMIN C) 500 MG tablet Take 500 mg by mouth daily.  Marland Kitchen aspirin 81 MG tablet Take 81 mg by mouth daily.  Marland Kitchen atorvastatin (LIPITOR) 40 MG tablet Take 1 tablet (40 mg total) by mouth daily.  . busPIRone (BUSPAR) 10 MG tablet TAKE 1 TABLET BY MOUTH THREE TIMES DAILY  . Calcium Citrate-Vitamin D (CALCIUM + D PO) Take 1,000 mg by mouth daily.  . DULoxetine (CYMBALTA) 60 MG capsule Take 1 capsule (60 mg total) by mouth daily.  . furosemide (LASIX) 20 MG tablet Take 1 tablet (20 mg total) by mouth daily.  Marland Kitchen gabapentin (NEURONTIN) 800 MG tablet Take 1 tablet (800 mg total) by mouth 3 (three) times daily.  .  hydrOXYzine (ATARAX/VISTARIL) 10 MG tablet Take 1 tablet by mouth three times daily as needed  . lansoprazole (PREVACID) 15 MG capsule Take 15 mg by mouth daily.  . metFORMIN (GLUCOPHAGE) 500 MG tablet TAKE 1 TABLET BY MOUTH THREE TIMES DAILY WITH MEALS  . Multiple Vitamins-Minerals (MULTIVITAMIN WITH MINERALS) tablet Take 1 tablet by mouth daily.  . potassium chloride SA (KLOR-CON) 20 MEQ tablet TAKE 1  BY MOUTH ONCE DAILY  . SPIRIVA RESPIMAT 2.5 MCG/ACT AERS INHALE 2 SPRAY(S) BY MOUTH ONCE DAILY  . telmisartan-hydrochlorothiazide (MICARDIS HCT) 40-12.5 MG tablet Take 1 tablet by mouth once daily  . tiZANidine (ZANAFLEX) 4 MG capsule TAKE 1 CAPSULE BY MOUTH THREE TIMES DAILY  . vitamin B-12 (CYANOCOBALAMIN) 1000 MCG tablet Take 1,000 mcg by mouth daily.  . [DISCONTINUED] atorvastatin (LIPITOR) 40 MG tablet Take 1 tablet by mouth once daily  . [DISCONTINUED] omeprazole (PRILOSEC) 40 MG capsule Take 1 capsule by mouth once daily  . [DISCONTINUED] potassium chloride SA (K-DUR,KLOR-CON) 20 MEQ tablet Take 1 tablet by mouth once daily   No facility-administered encounter medications on file as of 08/02/2020.    Follow Up Plan: [place] will reach out to patient to reschedule appointment.   Eula Fried, BSW, MSW, [facility]/THN Care Management [email or web address] Phone: [phone]

## 2020-08-06 NOTE — Telephone Encounter (Signed)
Pt has been r/s for 09/04/2020

## 2020-08-06 NOTE — Chronic Care Management (AMB) (Signed)
  Care Management   Note  08/06/2020 Name: [name] MRN: [id number] DOB: May 01, 1956  [name] is a 64 y.o. year old female who is a primary care patient of Venita Lick, NP and is actively engaged with the care management team. I reached out to [name] by phone today to assist with re-scheduling a follow up visit with the Pharmacist Licensed Clinical Social Worker  Follow up plan: Telephone appointment with care management team member scheduled REQ:JEAD 09/04/2020 and Pharm D 09/06/2020  Noreene Larsson, [place], [place], [place] [address] Direct Dial: [phone] [email or web address] Website: [email or web address]

## 2020-08-14 ENCOUNTER — Other Ambulatory Visit: Payer: Self-pay | Admitting: Nurse Practitioner

## 2020-08-14 NOTE — Telephone Encounter (Signed)
Requested medication (s) are due for refill today-should have RF  Requested medication (s) are on the active medication list -yes  Future visit scheduled -yes  Last refill: 05/16/20 #90 2 RF  Notes to clinic: Request for non delegated Rx  Requested Prescriptions  Pending Prescriptions Disp Refills   tiZANidine (ZANAFLEX) 4 MG capsule [Pharmacy Med Name: tiZANidine HCl 4 MG Oral Capsule] 90 capsule 0    Sig: TAKE 1 CAPSULE BY MOUTH THREE TIMES DAILY      Not Delegated - Cardiovascular:  Alpha-2 Agonists - tizanidine Failed - 08/14/2020  3:45 PM      Failed - This refill cannot be delegated      Passed - Valid encounter within last 6 months    Recent Outpatient Visits           5 months ago Obstructive sleep apnea syndrome   [place], Henrine Screws T, NP   5 months ago Benign hypertension   [place] [name], NP   6 months ago Chronic pain syndrome   [place], Henrine Screws T, NP   11 months ago COPD, severe ([place])   [place], [place] T, NP   1 year ago Depression, major, single episode, in partial remission ([place])   [place], Barbaraann Faster, NP       Future Appointments             In 2 weeks Cannady, Barbaraann Faster, NP [facility]                Requested Prescriptions  Pending Prescriptions Disp Refills   tiZANidine (ZANAFLEX) 4 MG capsule [Pharmacy Med Name: tiZANidine HCl 4 MG Oral Capsule] 90 capsule 0    Sig: TAKE 1 CAPSULE BY MOUTH THREE TIMES DAILY      Not Delegated - Cardiovascular:  Alpha-2 Agonists - tizanidine Failed - 08/14/2020  3:45 PM      Failed - This refill cannot be delegated      Passed - Valid encounter within last 6 months    Recent Outpatient Visits           5 months ago Obstructive sleep apnea syndrome   [place], Henrine Screws T, NP   5 months ago Benign hypertension   [facility] [name], NP   6 months ago Chronic pain syndrome   [place], Henrine Screws T, NP   11 months ago COPD, severe ([place])   [place], [place] T, NP   1 year ago Depression, major, single episode, in partial remission ([place])   [place], Barbaraann Faster, NP       Future Appointments             In 2 weeks Cannady, Barbaraann Faster, NP [facility]

## 2020-08-14 NOTE — Telephone Encounter (Signed)
Last seen 03/04/20 and has appointment 09/03/20

## 2020-08-16 ENCOUNTER — Telehealth: Payer: Medicaid Other

## 2020-08-22 ENCOUNTER — Other Ambulatory Visit: Payer: Self-pay | Admitting: Nurse Practitioner

## 2020-08-22 NOTE — Telephone Encounter (Signed)
Requested  medications are  due for refill today yes  Requested medications are on the active medication list yes  Last refill 8/3  Future visit scheduled Oct 2021  Notes to clinic Not Delegated

## 2020-09-01 ENCOUNTER — Encounter: Payer: Self-pay | Admitting: Nurse Practitioner

## 2020-09-03 ENCOUNTER — Ambulatory Visit (INDEPENDENT_AMBULATORY_CARE_PROVIDER_SITE_OTHER): Payer: 59 | Admitting: Nurse Practitioner

## 2020-09-03 ENCOUNTER — Other Ambulatory Visit: Payer: Self-pay

## 2020-09-03 ENCOUNTER — Encounter: Payer: Self-pay | Admitting: Nurse Practitioner

## 2020-09-03 VITALS — BP 118/68 | HR 94 | Temp 99.4°F | Ht 63.0 in | Wt 254.0 lb

## 2020-09-03 DIAGNOSIS — N1831 Chronic kidney disease, stage 3a: Secondary | ICD-10-CM

## 2020-09-03 DIAGNOSIS — R7301 Impaired fasting glucose: Secondary | ICD-10-CM

## 2020-09-03 DIAGNOSIS — J432 Centrilobular emphysema: Secondary | ICD-10-CM

## 2020-09-03 DIAGNOSIS — Z23 Encounter for immunization: Secondary | ICD-10-CM | POA: Diagnosis not present

## 2020-09-03 DIAGNOSIS — E66813 Obesity, class 3: Secondary | ICD-10-CM

## 2020-09-03 DIAGNOSIS — F324 Major depressive disorder, single episode, in partial remission: Secondary | ICD-10-CM | POA: Diagnosis not present

## 2020-09-03 DIAGNOSIS — E559 Vitamin D deficiency, unspecified: Secondary | ICD-10-CM

## 2020-09-03 DIAGNOSIS — Z6841 Body Mass Index (BMI) 40.0 and over, adult: Secondary | ICD-10-CM

## 2020-09-03 DIAGNOSIS — Z1231 Encounter for screening mammogram for malignant neoplasm of breast: Secondary | ICD-10-CM

## 2020-09-03 DIAGNOSIS — I700 Atherosclerosis of aorta: Secondary | ICD-10-CM | POA: Diagnosis not present

## 2020-09-03 DIAGNOSIS — E782 Mixed hyperlipidemia: Secondary | ICD-10-CM

## 2020-09-03 DIAGNOSIS — G4733 Obstructive sleep apnea (adult) (pediatric): Secondary | ICD-10-CM

## 2020-09-03 DIAGNOSIS — K2100 Gastro-esophageal reflux disease with esophagitis, without bleeding: Secondary | ICD-10-CM

## 2020-09-03 DIAGNOSIS — I10 Essential (primary) hypertension: Secondary | ICD-10-CM

## 2020-09-03 DIAGNOSIS — F17210 Nicotine dependence, cigarettes, uncomplicated: Secondary | ICD-10-CM

## 2020-09-03 LAB — UA/M W/RFLX CULTURE, ROUTINE
Bilirubin, UA: NEGATIVE
Glucose, UA: NEGATIVE
Ketones, UA: NEGATIVE
Leukocytes,UA: NEGATIVE
Nitrite, UA: NEGATIVE
Protein,UA: NEGATIVE
RBC, UA: NEGATIVE
Specific Gravity, UA: <1.005 — ABNORMAL LOW (ref 1.005–1.030)
Urobilinogen, Ur: 0.2 mg/dL (ref 0.2–1.0)
pH, UA: 5 (ref 5.0–7.5)

## 2020-09-03 MED ORDER — DULOXETINE HCL 60 MG PO CPEP: 60.0000 mg | ORAL_CAPSULE | Freq: Every day | ORAL | 4 refills | Status: DC

## 2020-09-03 MED ORDER — METFORMIN HCL 500 MG PO TABS: 500.0000 mg | ORAL_TABLET | Freq: Three times a day (TID) | ORAL | 4 refills | Status: DC

## 2020-09-03 MED ORDER — HYDROXYZINE HCL 10 MG PO TABS: 10.0000 mg | ORAL_TABLET | Freq: Three times a day (TID) | ORAL | 2 refills | Status: DC | PRN

## 2020-09-03 MED ORDER — ATORVASTATIN CALCIUM 40 MG PO TABS: 40.0000 mg | ORAL_TABLET | Freq: Every day | ORAL | 4 refills | Status: DC

## 2020-09-03 MED ORDER — GABAPENTIN 800 MG PO TABS: 800.0000 mg | ORAL_TABLET | Freq: Three times a day (TID) | ORAL | 4 refills | Status: DC

## 2020-09-03 MED ORDER — FUROSEMIDE 20 MG PO TABS: 20.0000 mg | ORAL_TABLET | Freq: Every day | ORAL | 4 refills | Status: DC

## 2020-09-03 MED ORDER — BUSPIRONE HCL 10 MG PO TABS: 20.0000 mg | ORAL_TABLET | Freq: Three times a day (TID) | ORAL | 4 refills | Status: DC

## 2020-09-03 MED ORDER — TELMISARTAN-HCTZ 40-12.5 MG PO TABS: 1.0000 | ORAL_TABLET | Freq: Every day | ORAL | 4 refills | Status: DC

## 2020-09-03 NOTE — Assessment & Plan Note (Signed)
Chronic, ongoing.  Continue current medication regimen, with exception of increase in Buspar due to ongoing anxiety due to chronic diseases. Duloxetine offers benefit to pain and mood.  Refills sent.  Denies SI/HI.  Return in 6 weeks.

## 2020-09-03 NOTE — Assessment & Plan Note (Signed)
Chronic, ongoing.  Continue current medication regimen and adjust as needed.  Not O2 dependent at this time.  Continue to recommend complete smoking cessation. Spirometry at next visit.  Consider referral to pulmonary if worsening.

## 2020-09-03 NOTE — Assessment & Plan Note (Signed)
Continue supplement and check Vit D level today.

## 2020-09-03 NOTE — Assessment & Plan Note (Signed)
GFR in the 50's last visit.  Recheck today.  Reminded to avoid NSAIDs.  Monitor function, may need to reduce Metformin in future.  Labs today.

## 2020-09-03 NOTE — Assessment & Plan Note (Signed)
Chronic, ongoing, noted on lung CT 10/02/2019.  Recommend she continue ASA and statin daily for prevention.  Recommend complete cessation of smoking. 

## 2020-09-03 NOTE — Assessment & Plan Note (Addendum)
Chronic, ongoing.  BP below goal today.  Continue current medication regimen and adjust as needed.  Recommend she monitor BP at home daily and document for provider + focus on DASH diet.  Obtain BMP today.  Refills sent in.  Return to office in 6 months.

## 2020-09-03 NOTE — Assessment & Plan Note (Signed)
I have recommended complete cessation of tobacco use. I have discussed various options available for assistance with tobacco cessation including over the counter methods (Nicotine gum, patch and lozenges). We also discussed prescription options (Chantix, Nicotine Inhaler / Nasal Spray). The patient is not interested in pursuing any prescription tobacco cessation options at this time.  Continue annual lung screening. 

## 2020-09-03 NOTE — Patient Instructions (Signed)

## 2020-09-03 NOTE — Assessment & Plan Note (Signed)
Chronic, ongoing.   Her current CPAP setting is 20.  She uses her CPAP 100% of the time, using it every night for 8 hours a night.  She is very dedicated to her CPAP use, which benefits her overall health, including her chronic conditions such as COPD, HTN, obesity.  Continue 100% CPAP use.

## 2020-09-03 NOTE — Assessment & Plan Note (Signed)
Chronic, stable without PPI.  Continue to monitor and continue Prevacid.

## 2020-09-03 NOTE — Assessment & Plan Note (Deleted)
Chronic, ongoing.  Continue Prilosec.  Attempt reduction in upcoming months.  Obtain Mag level next visit.

## 2020-09-03 NOTE — Assessment & Plan Note (Signed)
Recommend continued focus on health diet choices and regular physical activity (30 minutes 5 days a week).  Continue Metformin to assist with weight loss and renal dose as needed.  

## 2020-09-03 NOTE — Assessment & Plan Note (Signed)
Check A1C today

## 2020-09-03 NOTE — Assessment & Plan Note (Signed)
Chronic, ongoing.  Continue statin and adjust as needed.  Labs today.

## 2020-09-03 NOTE — Progress Notes (Signed)
BP 118/68 (BP Location: Left Arm, Patient Position: Sitting, Cuff Size: Large)   Pulse 94   Temp 99.4 F (37.4 C) (Oral)   Ht 5\' 3"  (1.6 m)   Wt 254 lb (115.2 kg)   LMP  (LMP Unknown)   SpO2 95%   BMI 44.99 kg/m    Subjective:    Patient ID: [name], female    DOB: 10/26/1956, 64 y.o.   MRN: [id number]  HPI: [name] is a 64 y.o. female  Chief Complaint  Patient presents with  . Follow-up   HYPERTENSION / HYPERLIPIDEMIA Continues on Telmisartan-HCTZ 40-12.5, Lasix, K+, and Lipitor. Has been taking Metformin over past year to assist with weight loss and prediabetes, A1C over past years 5.7 to 5.9 -- last A1C 5.6%.  She currently has new CPAP supplies and uses her machine every night.   Satisfied with current treatment? yes Duration of hypertension: chronic BP monitoring frequency: not checking BP range:  BP medication side effects: no Duration of hyperlipidemia: chronic Cholesterol medication side effects: no Cholesterol supplements: none Medication compliance: good compliance Aspirin: yes Recent stressors: no Recurrent headaches: no Visual changes: no Palpitations: no Dyspnea: no Chest pain: no Lower extremity edema: no Dizzy/lightheaded: no   COPD Last lung screening was in November 2020. Continues on Spiriva.  Continues to smoke 1 PPD and is not interested in quitting. COPD status: stable Satisfied with current treatment?: yes Oxygen use: no Dyspnea frequency: occasional, at baseline Cough frequency: occasional Rescue inhaler frequency:  none Limitation of activity: no Productive cough: none Last Spirometry: none recent Pneumovax: Up to Date Influenza: Up to Date  DEPRESSION Continues on Cymbalta, Buspar 10 MG TID, Vistaril.  Endorses increased panic attacks recently due to her new CPAP machine.   Mood status: stable Satisfied with current treatment?: yes Symptom severity: moderate  Duration of current treatment : chronic Side effects:  no Medication compliance: good compliance Psychotherapy/counseling: none Depressed mood: sometimes Anxious mood: no Anhedonia: no Significant weight loss or gain: no Insomnia: yes hard to stay asleep Fatigue: no Feelings of worthlessness or guilt: no Impaired concentration/indecisiveness: no Suicidal ideations: no Hopelessness: no Crying spells: no Depression screen [facility] 2/9 09/03/2020 03/29/2020 03/04/2020 04/05/2019 12/30/2018  Decreased Interest 0 0 0 0 0  Down, Depressed, Hopeless 0 0 0 0 0  PHQ - 2 Score 0 0 0 0 0  Altered sleeping 0 1 1 0 1  Tired, decreased energy 0 0 0 0 1  Change in appetite 0 0 0 0 -  Feeling bad or failure about yourself  0 0 0 0 0  Trouble concentrating 0 0 0 0 0  Moving slowly or fidgety/restless 0 0 0 0 0  Suicidal thoughts 0 0 0 0 0  PHQ-9 Score 0 1 1 0 2  Difficult doing work/chores Not difficult at all Not difficult at all Not difficult at all - Not difficult at all  Some recent data might be hidden   GERD Continues on Prevacid and reports good control. GERD control status: stable  Satisfied with current treatment? yes Heartburn frequency: none Medication side effects: no  Medication compliance: stable Dysphagia: no Odynophagia:  no Hematemesis: no Blood in stool: no EGD: no  VITAMIN D DEFICIENCY: Continues on daily supplement without issues.  No recent falls or fractures.  Relevant past medical, surgical, family and social history reviewed and updated as indicated. Interim medical history since our last visit reviewed. Allergies and medications reviewed and updated.  Review of  Systems  Constitutional: Negative for activity change, appetite change, diaphoresis, fatigue and fever.  Respiratory: Negative for cough, chest tightness and shortness of breath.   Cardiovascular: Negative for chest pain, palpitations and leg swelling.  Gastrointestinal: Negative for abdominal distention, abdominal pain, constipation, diarrhea, nausea and vomiting.   Endocrine: Negative for cold intolerance, heat intolerance, polydipsia, polyphagia and polyuria.  Neurological: Negative for dizziness, syncope, weakness, light-headedness, numbness and headaches.  Psychiatric/Behavioral: Negative.     Per HPI unless specifically indicated above     Objective:    BP 118/68 (BP Location: Left Arm, Patient Position: Sitting, Cuff Size: Large)   Pulse 94   Temp 99.4 F (37.4 C) (Oral)   Ht 5\' 3"  (1.6 m)   Wt 254 lb (115.2 kg)   LMP  (LMP Unknown)   SpO2 95%   BMI 44.99 kg/m   Wt Readings from Last 3 Encounters:  09/03/20 254 lb (115.2 kg)  09/29/19 266 lb (120.7 kg)  04/05/19 254 lb (115.2 kg)    Physical Exam Vitals and nursing note reviewed.  Constitutional:      General: She is awake. She is not in acute distress.    Appearance: She is well-developed. She is obese. She is not ill-appearing.  HENT:     Head: Normocephalic.     Right Ear: Hearing normal.     Left Ear: Hearing normal.  Eyes:     General: Lids are normal.        Right eye: No discharge.        Left eye: No discharge.     Conjunctiva/sclera: Conjunctivae normal.     Pupils: Pupils are equal, round, and reactive to light.  Neck:     Vascular: No carotid bruit.  Cardiovascular:     Rate and Rhythm: Normal rate and regular rhythm.     Heart sounds: Normal heart sounds. No murmur heard.  No gallop.   Pulmonary:     Effort: Pulmonary effort is normal. No accessory muscle usage or respiratory distress.     Breath sounds: Normal breath sounds.  Abdominal:     General: Bowel sounds are normal.     Palpations: Abdomen is soft.  Musculoskeletal:     Cervical back: Normal range of motion and neck supple.     Right lower leg: No edema.     Left lower leg: No edema.  Skin:    General: Skin is warm and dry.  Neurological:     Mental Status: She is alert and oriented to person, place, and time.  Psychiatric:        Attention and Perception: Attention normal.        Mood  and Affect: Mood normal.        Speech: Speech normal.        Behavior: Behavior normal. Behavior is cooperative.        Thought Content: Thought content normal.     Results for orders placed or performed in visit on 09/03/20  UA/M w/rflx Culture, Routine   Specimen: Urine   Urine  Result Value Ref Range   Specific Gravity, UA <1.005 (L) 1.005 - 1.030   pH, UA 5.0 5.0 - 7.5   Color, UA Yellow Yellow   Appearance Ur Clear Clear   Leukocytes,UA Negative Negative   Protein,UA Negative Negative/Trace   Glucose, UA Negative Negative   Ketones, UA Negative Negative   RBC, UA Negative Negative   Bilirubin, UA Negative Negative   Urobilinogen, Ur 0.2 0.2 -  1.0 mg/dL   Nitrite, UA Negative Negative      Assessment & Plan:   Problem List Items Addressed This Visit      Cardiovascular and Mediastinum   Benign hypertension    Chronic, ongoing.  BP below goal today.  Continue current medication regimen and adjust as needed.  Recommend she monitor BP at home daily and document for provider + focus on DASH diet.  Obtain BMP today.  Refills sent in.  Return to office in 6 months.      Relevant Medications   atorvastatin (LIPITOR) 40 MG tablet   furosemide (LASIX) 20 MG tablet   telmisartan-hydrochlorothiazide (MICARDIS HCT) 40-12.5 MG tablet   Other Relevant Orders   Basic metabolic panel   TSH   UA/M w/rflx Culture, Routine (Completed)   Aortic atherosclerosis (HCC)    Chronic, ongoing, noted on lung CT 10/02/2019.  Recommend she continue ASA and statin daily for prevention.  Recommend complete cessation of smoking.      Relevant Medications   atorvastatin (LIPITOR) 40 MG tablet   furosemide (LASIX) 20 MG tablet   telmisartan-hydrochlorothiazide (MICARDIS HCT) 40-12.5 MG tablet     Respiratory   Centrilobular emphysema (HCC) - Primary    Chronic, ongoing.  Continue current medication regimen and adjust as needed.  Not O2 dependent at this time.  Continue to recommend complete  smoking cessation. Spirometry at next visit.  Consider referral to pulmonary if worsening.      Relevant Orders   CBC with Differential/Platelet   Sleep apnea    Chronic, ongoing.   Her current CPAP setting is 20.  She uses her CPAP 100% of the time, using it every night for 8 hours a night.  She is very dedicated to her CPAP use, which benefits her overall health, including her chronic conditions such as COPD, HTN, obesity.  Continue 100% CPAP use.        Digestive   Gastroesophageal reflux disease    Chronic, stable without PPI.  Continue to monitor and continue Prevacid.        Endocrine   IFG (impaired fasting glucose)    Check A1C today.        Genitourinary   Chronic kidney disease, stage 3a ([place])    GFR in the 50's last visit.  Recheck today.  Reminded to avoid NSAIDs.  Monitor function, may need to reduce Metformin in future.  Labs today.        Other   Depression, major, single episode, in partial remission (HCC)    Chronic, ongoing.  Continue current medication regimen, with exception of increase in Buspar due to ongoing anxiety due to chronic diseases. Duloxetine offers benefit to pain and mood.  Refills sent.  Denies SI/HI.  Return in 6 weeks.        Relevant Medications   busPIRone (BUSPAR) 10 MG tablet   DULoxetine (CYMBALTA) 60 MG capsule   hydrOXYzine (ATARAX/VISTARIL) 10 MG tablet   Hyperlipidemia    Chronic, ongoing.  Continue statin and adjust as needed.  Labs today.      Relevant Medications   atorvastatin (LIPITOR) 40 MG tablet   furosemide (LASIX) 20 MG tablet   telmisartan-hydrochlorothiazide (MICARDIS HCT) 40-12.5 MG tablet   Other Relevant Orders   Lipid Panel w/o Chol/HDL Ratio   Obesity    Recommend continued focus on health diet choices and regular physical activity (30 minutes 5 days a week).  Continue Metformin to assist with weight loss and renal dose as  needed.       Relevant Medications   metFORMIN (GLUCOPHAGE) 500 MG tablet    Nicotine dependence, cigarettes, uncomplicated    I have recommended complete cessation of tobacco use. I have discussed various options available for assistance with tobacco cessation including over the counter methods (Nicotine gum, patch and lozenges). We also discussed prescription options (Chantix, Nicotine Inhaler / Nasal Spray). The patient is not interested in pursuing any prescription tobacco cessation options at this time.  Continue annual lung screening.       Vitamin D deficiency    Continue supplement and check Vit D level today.      Relevant Orders   VITAMIN D 25 Hydroxy (Vit-D Deficiency, Fractures)    Other Visit Diagnoses    Need for influenza vaccination       Relevant Orders   Flu Vaccine QUAD 36+ mos IM (Completed)   Encounter for screening mammogram for malignant neoplasm of breast       Relevant Orders   MM DIGITAL SCREENING BILATERAL       Follow up plan: Return in about 6 months (around 03/04/2021) for HTN/HLD, MOOD, COPD, VIT D -- spirometry.

## 2020-09-04 ENCOUNTER — Ambulatory Visit: Payer: Medicaid Other | Admitting: Licensed Clinical Social Worker

## 2020-09-04 ENCOUNTER — Other Ambulatory Visit: Payer: Self-pay | Admitting: Nurse Practitioner

## 2020-09-04 ENCOUNTER — Telehealth: Payer: Self-pay | Admitting: Licensed Clinical Social Worker

## 2020-09-04 DIAGNOSIS — D72820 Lymphocytosis (symptomatic): Secondary | ICD-10-CM

## 2020-09-04 DIAGNOSIS — R7301 Impaired fasting glucose: Secondary | ICD-10-CM

## 2020-09-04 LAB — CBC WITH DIFFERENTIAL/PLATELET
Basophils Absolute: 0.1 10*3/uL (ref 0.0–0.2)
Basos: 1 %
EOS (ABSOLUTE): 0.2 10*3/uL (ref 0.0–0.4)
Eos: 1 %
Hematocrit: 40.4 % (ref 34.0–46.6)
Hemoglobin: 13.1 g/dL (ref 11.1–15.9)
Immature Grans (Abs): 0 10*3/uL (ref 0.0–0.1)
Immature Granulocytes: 0 %
Lymphocytes Absolute: 3.2 10*3/uL — ABNORMAL HIGH (ref 0.7–3.1)
Lymphs: 27 %
MCH: 27.9 pg (ref 26.6–33.0)
MCHC: 32.4 g/dL (ref 31.5–35.7)
MCV: 86 fL (ref 79–97)
Monocytes Absolute: 1.1 10*3/uL — ABNORMAL HIGH (ref 0.1–0.9)
Monocytes: 9 %
Neutrophils Absolute: 7.3 10*3/uL — ABNORMAL HIGH (ref 1.4–7.0)
Neutrophils: 62 %
Platelets: 258 10*3/uL (ref 150–450)
RBC: 4.7 x10E6/uL (ref 3.77–5.28)
RDW: 13.6 % (ref 11.7–15.4)
WBC: 12 10*3/uL — ABNORMAL HIGH (ref 3.4–10.8)

## 2020-09-04 LAB — BASIC METABOLIC PANEL
BUN/Creatinine Ratio: 13 (ref 12–28)
BUN: 14 mg/dL (ref 8–27)
CO2: 23 mmol/L (ref 20–29)
Calcium: 10.1 mg/dL (ref 8.7–10.3)
Chloride: 102 mmol/L (ref 96–106)
Creatinine, Ser: 1.07 mg/dL — ABNORMAL HIGH (ref 0.57–1.00)
GFR calc Af Amer: 63 mL/min/{1.73_m2} (ref >59)
GFR calc non Af Amer: 55 mL/min/{1.73_m2} — ABNORMAL LOW (ref >59)
Glucose: 114 mg/dL — ABNORMAL HIGH (ref 65–99)
Potassium: 5 mmol/L (ref 3.5–5.2)
Sodium: 141 mmol/L (ref 134–144)

## 2020-09-04 LAB — LIPID PANEL W/O CHOL/HDL RATIO
Cholesterol, Total: 164 mg/dL (ref 100–199)
HDL: 50 mg/dL (ref >39)
LDL Chol Calc (NIH): 89 mg/dL (ref 0–99)
Triglycerides: 141 mg/dL (ref 0–149)
VLDL Cholesterol Cal: 25 mg/dL (ref 5–40)

## 2020-09-04 LAB — TSH: TSH: 1.9 u[IU]/mL (ref 0.450–4.500)

## 2020-09-04 LAB — VITAMIN D 25 HYDROXY (VIT D DEFICIENCY, FRACTURES): Vit D, 25-Hydroxy: 39.3 ng/mL (ref 30.0–100.0)

## 2020-09-04 NOTE — Chronic Care Management (AMB) (Signed)
Care Management   Follow Up Note   09/04/2020 Name: [name] MRN: [id number] DOB: 01-21-1956  Referred by: Venita Lick, NP Reason for referral : Care Coordination   [name] is a 64 y.o. year old female who is a primary care patient of Cannady, Barbaraann Faster, NP. The care management team was consulted for assistance with care management and care coordination needs.    Review of patient status, including review of consultants reports, relevant laboratory and other test results, and collaboration with appropriate care team members and the patient's provider was performed as part of comprehensive patient evaluation and provision of chronic care management services.    SDOH (Social Determinants of Health) assessments performed: Yes See Care Plan activities for detailed interventions related to [facility])     Advanced Directives: See Care Plan and Vynca application for related entries.   Goals Addressed    .  SW-Cope with Chronic Pain        Follow Up Date within 90 days from 09/04/20   - attend at least 90 percent of counseling appointments - begin personal counseling - join a support group - learn how to meditate - learn relaxation techniques - practice acceptance of chronic pain - practice relaxation or meditation daily - spend time with positive people - tell myself I can (not I can't) - think of new ways to do favorite things - use distraction techniques - use relaxation during pain    Why is this important?   Stress makes chronic pain feel worse.  Feelings like depression, anxiety, stress and anger can make your body more sensitive to pain.  Learning ways to cope with stress or depression may help you find some relief from the pain.     Notes:     .  SW-Track and Manage My Symptoms (pt-stated)        Follow Up Date  within 90 days since 09/04/20   - avoid negative self-talk - develop a personal safety plan - develop a plan to deal with triggers like  holidays, anniversaries - exercise at least 2 to 3 times per week - have a plan for how to handle bad days - journal feelings and what helps to feel better or worse - spend time or talk with others at least 2 to 3 times per week - spend time or talk with others every day - watch for early signs of feeling worse - write in journal every day   Patient reports that she was approved for [facility]. Patient reports that this helps somewhat with her insurance premiums. Patient reports that her disabled son resides with her which gives her some socialization and assistance when needed. She shares that her sister comes by daily to check on her.   Patient reports ongoing difficulty with sleep. LCSW provided education on healthy sleep hygiene and what that looks like. LCSW encouraged patient to implement a night time routine into her schedule that works best for her and that she is able to maintain. Patient reports that they are clearing out the woods in her backyard to create new homes which causes constant noise. Patient's PCP increased her Buspar medication which she hopes will help with her sleep. Patient reports that she is actively trying to improve her sleep by using the fan and taking several deep breaths. Advised patient to continue implementing deep breathing/grounding/meditation/self-care exercises into her nightly routine to combat racing thoughts at night.    Why is this important?  Keeping track of your progress will help your treatment team find the right mix of medicine and therapy for you.  Write in your journal every day.  Day-to-day changes in depression symptoms are normal. It may be more helpful to check your progress at the end of each week instead of every day.     Notes:     The care management team will reach out to the patient again over the next 90 days.   Eula Fried, BSW, MSW, [facility] [email or web address] Phone: [phone]

## 2020-09-04 NOTE — Telephone Encounter (Signed)
  Chronic Care Management    Clinical Social Work General Follow Up Note  09/04/2020 Name: [name] MRN: [id number] DOB: 01-Apr-1956  [name] is a 64 y.o. year old female who is a primary care patient of Cannady, Barbaraann Faster, NP. The CCM team was consulted for assistance with Mental Health Counseling and Resources.   Review of patient status, including review of consultants reports, relevant laboratory and other test results, and collaboration with appropriate care team members and the patient's provider was performed as part of comprehensive patient evaluation and provision of chronic care management services.    LCSW completed two CCM outreach attempts today but was unable to reach patient successfully. A HIPPA compliant voice message was left encouraging patient to return call once available. LCSW will ask Scheduling Care Guide to reschedule CCM SW appointment with patient as well.  Outpatient Encounter Medications as of 09/04/2020  Medication Sig  . acetaminophen (TYLENOL) 500 MG tablet Take 1,000 mg by mouth in the morning and at bedtime.  Marland Kitchen albuterol (PROAIR HFA) 108 (90 Base) MCG/ACT inhaler Inhale 2 puffs into the lungs every 6 (six) hours as needed for wheezing or shortness of breath.  Marland Kitchen ascorbic acid (VITAMIN C) 500 MG tablet Take 500 mg by mouth daily. (Patient not taking: Reported on 09/03/2020)  . aspirin 81 MG tablet Take 81 mg by mouth daily.  Marland Kitchen atorvastatin (LIPITOR) 40 MG tablet Take 1 tablet (40 mg total) by mouth daily.  . busPIRone (BUSPAR) 10 MG tablet Take 2 tablets (20 mg total) by mouth 3 (three) times daily.  . Calcium Citrate-Vitamin D (CALCIUM + D PO) Take 1,000 mg by mouth daily.  . DULoxetine (CYMBALTA) 60 MG capsule Take 1 capsule (60 mg total) by mouth daily.  . furosemide (LASIX) 20 MG tablet Take 1 tablet (20 mg total) by mouth daily.  Marland Kitchen gabapentin (NEURONTIN) 800 MG tablet Take 1 tablet (800 mg total) by mouth 3 (three) times daily.  . hydrOXYzine  (ATARAX/VISTARIL) 10 MG tablet Take 1 tablet (10 mg total) by mouth 3 (three) times daily as needed.  . lansoprazole (PREVACID) 15 MG capsule Take 15 mg by mouth daily.  . metFORMIN (GLUCOPHAGE) 500 MG tablet Take 1 tablet (500 mg total) by mouth 3 (three) times daily with meals.  . Multiple Vitamins-Minerals (MULTIVITAMIN WITH MINERALS) tablet Take 1 tablet by mouth daily.  . potassium chloride SA (KLOR-CON) 20 MEQ tablet TAKE 1  BY MOUTH ONCE DAILY  . SPIRIVA RESPIMAT 2.5 MCG/ACT AERS INHALE 2 SPRAY(S) BY MOUTH ONCE DAILY  . telmisartan-hydrochlorothiazide (MICARDIS HCT) 40-12.5 MG tablet Take 1 tablet by mouth daily.  Marland Kitchen tiZANidine (ZANAFLEX) 4 MG capsule TAKE 1 CAPSULE BY MOUTH THREE TIMES DAILY  . vitamin B-12 (CYANOCOBALAMIN) 1000 MCG tablet Take 1,000 mcg by mouth daily.  . [DISCONTINUED] atorvastatin (LIPITOR) 40 MG tablet Take 1 tablet by mouth once daily  . [DISCONTINUED] omeprazole (PRILOSEC) 40 MG capsule Take 1 capsule by mouth once daily  . [DISCONTINUED] potassium chloride SA (K-DUR,KLOR-CON) 20 MEQ tablet Take 1 tablet by mouth once daily   No facility-administered encounter medications on file as of 09/04/2020.   Follow Up Plan: [place] will reach out to patient to reschedule appointment.   Eula Fried, BSW, MSW, [facility] Care Management [email or web address] Phone: [phone]

## 2020-09-04 NOTE — Progress Notes (Signed)
Contacted via [place] afternoon [name], your labs have returned.  Kidney function shows some very mild kidney disease, but we do not need to stop Metformin.  We will continue to monitor this.  Cholesterol levels are good, continue your statin daily.  Thyroid level and Vit D level look good.  Your CBC shows mild elevated in white blood cell count + neutrophils, lymphocytes, and monocytes.  This has been ongoing since at least 2015 and remains stable.  I would like to recheck this though in 6 weeks via outpatient labs to ensure not going up.  Could you schedule lab visit only for 6 weeks?  Have you had any fevers, night sweats, or decreased appetite?  Let me know.   Keep being awesome!!  Thank you for allowing me to participate in your care. Kindest regards, Kellyn Mansfield

## 2020-09-06 ENCOUNTER — Telehealth: Payer: Medicaid Other

## 2020-09-09 ENCOUNTER — Other Ambulatory Visit: Payer: Self-pay | Admitting: Nurse Practitioner

## 2020-09-10 ENCOUNTER — Other Ambulatory Visit: Payer: Self-pay

## 2020-09-10 MED ORDER — TIZANIDINE HCL 4 MG PO CAPS
4.0000 mg | ORAL_CAPSULE | Freq: Three times a day (TID) | ORAL | 0 refills | Status: DC
Start: 2020-09-10 — End: 2020-10-14

## 2020-09-25 ENCOUNTER — Telehealth: Payer: Self-pay

## 2020-09-25 DIAGNOSIS — Z122 Encounter for screening for malignant neoplasm of respiratory organs: Secondary | ICD-10-CM

## 2020-09-25 DIAGNOSIS — Z87891 Personal history of nicotine dependence: Secondary | ICD-10-CM

## 2020-09-25 NOTE — Telephone Encounter (Signed)
Patient contacted and scheduled for CT lung screening scan on 10/02/2020 at 4:30pm. Verified with patient smoking status. She is a current smoker and is smoking about 1 pack per day currently and has been a smoker for approximately 48 years.

## 2020-09-26 NOTE — Telephone Encounter (Signed)
Current smoker, 50 pack year

## 2020-09-26 NOTE — Addendum Note (Signed)
Addended by: Lieutenant Diego on: 09/26/2020 02:41 PM   Modules accepted: Orders

## 2020-10-02 ENCOUNTER — Ambulatory Visit: Payer: BC Managed Care – PPO

## 2020-10-08 ENCOUNTER — Telehealth: Payer: Self-pay | Admitting: *Deleted

## 2020-10-08 NOTE — Telephone Encounter (Signed)
Left voicemail in attempt to schedule lung screening scan. Insurance auth obtained.

## 2020-10-10 ENCOUNTER — Ambulatory Visit
Admission: RE | Admit: 2020-10-10 | Discharge: 2020-10-10 | Disposition: A | Discharge status: Home / Self Care | Payer: 59 | Source: Ambulatory Visit | Attending: Nurse Practitioner | Admitting: Nurse Practitioner

## 2020-10-10 ENCOUNTER — Other Ambulatory Visit: Payer: Self-pay

## 2020-10-10 DIAGNOSIS — Z87891 Personal history of nicotine dependence: Secondary | ICD-10-CM | POA: Diagnosis not present

## 2020-10-10 DIAGNOSIS — Z122 Encounter for screening for malignant neoplasm of respiratory organs: Secondary | ICD-10-CM | POA: Diagnosis present

## 2020-10-12 ENCOUNTER — Encounter: Payer: Self-pay | Admitting: *Deleted

## 2020-10-14 ENCOUNTER — Other Ambulatory Visit: Payer: Self-pay

## 2020-10-14 MED ORDER — TIZANIDINE HCL 4 MG PO CAPS
4.0000 mg | ORAL_CAPSULE | Freq: Three times a day (TID) | ORAL | 0 refills | Status: DC
Start: 2020-10-14 — End: 2020-11-11

## 2020-10-16 ENCOUNTER — Other Ambulatory Visit: Payer: Self-pay | Admitting: Nurse Practitioner

## 2020-10-16 NOTE — Telephone Encounter (Signed)
Lvm with CMA message below.

## 2020-10-16 NOTE — Telephone Encounter (Signed)
Refused by: Jerelene Redden, CMA   Refusal reason: Patient has requested refill too soon   Non-formulary

## 2020-10-16 NOTE — Telephone Encounter (Signed)
Requested medications are due for refill today yes  Requested medications are on the active medication list yes  Last refill 10/12  Last visit 10/5  Future visit scheduled today  Notes to clinic Not Delegated

## 2020-10-17 ENCOUNTER — Other Ambulatory Visit: Payer: 59

## 2020-10-17 ENCOUNTER — Other Ambulatory Visit: Payer: Self-pay

## 2020-10-17 DIAGNOSIS — R7301 Impaired fasting glucose: Secondary | ICD-10-CM

## 2020-10-17 DIAGNOSIS — D72820 Lymphocytosis (symptomatic): Secondary | ICD-10-CM

## 2020-10-18 ENCOUNTER — Other Ambulatory Visit: Payer: Self-pay | Admitting: Nurse Practitioner

## 2020-10-18 DIAGNOSIS — D72820 Lymphocytosis (symptomatic): Secondary | ICD-10-CM

## 2020-10-18 LAB — CBC WITH DIFFERENTIAL/PLATELET
Basophils Absolute: 0.1 10*3/uL (ref 0.0–0.2)
Basos: 1 %
EOS (ABSOLUTE): 0.3 10*3/uL (ref 0.0–0.4)
Eos: 2 %
Hematocrit: 39.3 % (ref 34.0–46.6)
Hemoglobin: 13 g/dL (ref 11.1–15.9)
Immature Grans (Abs): 0 10*3/uL (ref 0.0–0.1)
Immature Granulocytes: 0 %
Lymphocytes Absolute: 5.3 10*3/uL — ABNORMAL HIGH (ref 0.7–3.1)
Lymphs: 33 %
MCH: 28.7 pg (ref 26.6–33.0)
MCHC: 33.1 g/dL (ref 31.5–35.7)
MCV: 87 fL (ref 79–97)
Monocytes Absolute: 1.7 10*3/uL — ABNORMAL HIGH (ref 0.1–0.9)
Monocytes: 11 %
Neutrophils Absolute: 8.6 10*3/uL — ABNORMAL HIGH (ref 1.4–7.0)
Neutrophils: 53 %
Platelets: 293 10*3/uL (ref 150–450)
RBC: 4.53 x10E6/uL (ref 3.77–5.28)
RDW: 13.8 % (ref 11.7–15.4)
WBC: 16 10*3/uL — ABNORMAL HIGH (ref 3.4–10.8)

## 2020-10-18 LAB — C-REACTIVE PROTEIN: CRP: 9 mg/L (ref 0–10)

## 2020-10-18 LAB — IRON,TIBC AND FERRITIN PANEL
Ferritin: 40 ng/mL (ref 15–150)
Iron Saturation: 14 % — ABNORMAL LOW (ref 15–55)
Iron: 47 ug/dL (ref 27–139)
Total Iron Binding Capacity: 329 ug/dL (ref 250–450)
UIBC: 282 ug/dL (ref 118–369)

## 2020-10-18 LAB — HEMOGLOBIN A1C
Est. average glucose Bld gHb Est-mCnc: 123 mg/dL
Hgb A1c MFr Bld: 5.9 % — ABNORMAL HIGH (ref 4.8–5.6)

## 2020-10-18 LAB — SEDIMENTATION RATE: Sed Rate: 34 mm/hr (ref 0–40)

## 2020-10-18 NOTE — Progress Notes (Signed)
Contacted via [place] afternoon [name], you labs have returned: - A1C shows continued prediabetes but slight trend down in A1C at 5.9%. - Iron level is on lower side of normal, but normal we will continue to monitor.  You may benefit from starting a daily multivitamin if you do not take one. - Your white blood cell count is still elevated, as are neutrophils.  I would like to get you into hematology for further assessment of this.  Looks like this has been ongoing since 2015, slight trend up at present.  Have you seen hematology for this before?  Let me know.  Any night sweats, fevers, appetite loss, or weight loss?  Any questions? Keep being awesome!!  Thank you for allowing me to participate in your care. Kindest regards, Oral Remache

## 2020-10-21 ENCOUNTER — Telehealth: Payer: Self-pay

## 2020-10-21 NOTE — Chronic Care Management (AMB) (Signed)
  Care Management   Note  10/21/2020 Name: [name] MRN: [id number] DOB: 29-Mar-1956  [name] is a 64 y.o. year old female who is a primary care patient of Venita Lick, NP and is actively engaged with the care management team. I reached out to [name] by phone today to assist with re-scheduling a follow up visit with the Pharmacist  Follow up plan: Unsuccessful telephone outreach attempt made. A HIPAA compliant phone message was left for the patient providing contact information and requesting a return call.  The care management team will reach out to the patient again over the next 7 days.  If patient returns call to provider office, please advise to call [facility]  at [address], [place], [place], [place] [address] Direct Dial: [phone] [email or web address] Website: [email or web address]

## 2020-10-29 ENCOUNTER — Inpatient Hospital Stay: Payer: 59

## 2020-10-29 ENCOUNTER — Inpatient Hospital Stay: Payer: 59 | Attending: Oncology | Admitting: Oncology

## 2020-10-29 ENCOUNTER — Other Ambulatory Visit: Payer: Self-pay | Admitting: Nurse Practitioner

## 2020-10-29 VITALS — BP 112/53 | HR 87 | Temp 99.3°F | Wt 240.8 lb

## 2020-10-29 DIAGNOSIS — D72820 Lymphocytosis (symptomatic): Secondary | ICD-10-CM

## 2020-10-29 DIAGNOSIS — D72821 Monocytosis (symptomatic): Secondary | ICD-10-CM | POA: Insufficient documentation

## 2020-10-29 DIAGNOSIS — F17210 Nicotine dependence, cigarettes, uncomplicated: Secondary | ICD-10-CM | POA: Insufficient documentation

## 2020-10-29 DIAGNOSIS — Z79899 Other long term (current) drug therapy: Secondary | ICD-10-CM | POA: Insufficient documentation

## 2020-10-29 LAB — CBC WITH DIFFERENTIAL/PLATELET
Abs Immature Granulocytes: 0.04 10*3/uL (ref 0.00–0.07)
Basophils Absolute: 0.1 10*3/uL (ref 0.0–0.1)
Basophils Relative: 1 %
Eosinophils Absolute: 0.2 10*3/uL (ref 0.0–0.5)
Eosinophils Relative: 1 %
HCT: 38.4 % (ref 36.0–46.0)
Hemoglobin: 12.5 g/dL (ref 12.0–15.0)
Immature Granulocytes: 0 %
Lymphocytes Relative: 28 %
Lymphs Abs: 3.6 10*3/uL (ref 0.7–4.0)
MCH: 28.9 pg (ref 26.0–34.0)
MCHC: 32.6 g/dL (ref 30.0–36.0)
MCV: 88.9 fL (ref 80.0–100.0)
Monocytes Absolute: 1.2 10*3/uL — ABNORMAL HIGH (ref 0.1–1.0)
Monocytes Relative: 10 %
Neutro Abs: 7.6 10*3/uL (ref 1.7–7.7)
Neutrophils Relative %: 60 %
Platelets: 308 10*3/uL (ref 150–400)
RBC: 4.32 MIL/uL (ref 3.87–5.11)
RDW: 15.3 % (ref 11.5–15.5)
WBC: 12.7 10*3/uL — ABNORMAL HIGH (ref 4.0–10.5)
nRBC: 0 % (ref 0.0–0.2)

## 2020-10-29 LAB — TECHNOLOGIST SMEAR REVIEW
Plt Morphology: NORMAL
RBC Morphology: NORMAL
WBC Morphology: NORMAL

## 2020-10-29 MED ORDER — BUSPIRONE HCL 10 MG PO TABS
20.0000 mg | ORAL_TABLET | Freq: Three times a day (TID) | ORAL | 12 refills | Status: DC
Start: 2020-10-29 — End: 2021-09-03

## 2020-10-30 ENCOUNTER — Telehealth: Payer: Medicaid Other

## 2020-10-31 ENCOUNTER — Encounter: Payer: Self-pay | Admitting: Oncology

## 2020-10-31 LAB — COMP PANEL: LEUKEMIA/LYMPHOMA

## 2020-10-31 NOTE — Progress Notes (Signed)
Hematology/Oncology Consult note [facility] [place] Telephone:([phone] [phone]  Patient Care Team: Venita Lick, NP as PCP - General (Nurse Practitioner) Greg Cutter, LCSW as Social Worker (Licensed Clinical Social Worker)   Name of the patient: [name]  [id number]  08-04-1956    Reason for referral-lymphocytosis   Referring physician-Jolene Ned Card, NP  Date of visit: 10/31/20   History of presenting illness- Patient is a 64 year old female with a past medical history significant for GERD, hypertension, COPD, sleep apnea referred for leukocytosis.  Looking back at her prior CBCs patient has had a mildly elevated white cell count that fluctuates between 11-16 over the last 6 years.  Differential is mainly showed neutrophilia and lymphocytosis as well as monocytosis.  Hemoglobin is remained stable between 12-13 and platelet counts have been normal.  Patient does have baseline shortness of breath and fatigue from her underlying COPD.  Appetite and weight have remained stable.  Denies any drenching night sweats.  ECOG PS- 1  Pain scale- 0   Review of systems- Review of Systems  Constitutional: Negative for chills, fever and weight loss.  HENT: Negative for congestion, ear discharge and nosebleeds.   Eyes: Negative for blurred vision.  Respiratory: Positive for shortness of breath. Negative for cough, hemoptysis, sputum production and wheezing.   Cardiovascular: Negative for chest pain, palpitations, orthopnea and claudication.  Gastrointestinal: Negative for abdominal pain, blood in stool, constipation, diarrhea, heartburn, melena, nausea and vomiting.  Genitourinary: Negative for dysuria, flank pain, frequency, hematuria and urgency.  Musculoskeletal: Negative for back pain, joint pain and myalgias.  Skin: Negative for rash.  Neurological: Negative for dizziness, tingling, focal weakness, seizures, weakness and headaches.    Endo/Heme/Allergies: Does not bruise/bleed easily.  Psychiatric/Behavioral: Negative for depression and suicidal ideas. The patient does not have insomnia.     No Known Allergies  Patient Active Problem List   Diagnosis Date Noted  . Chronic kidney disease, stage 3a ([place]) 03/04/2020  . Aortic atherosclerosis ([place]) 10/03/2019  . IFG (impaired fasting glucose) 09/04/2019  . Vitamin D deficiency 12/29/2018  . Osteoarthritis of right hip 12/27/2017  . Advanced care planning/counseling discussion 06/07/2017  . Benign hypertension 10/28/2015  . Nicotine dependence, cigarettes, uncomplicated 44/81/8563  . Facet syndrome, lumbar 07/11/2015  . Spinal stenosis, lumbar region, with neurogenic claudication 07/11/2015  . Status post cervical spinal fusion 07/11/2015  . DJD (degenerative joint disease) of knee 07/11/2015  . Chronic pain 05/17/2015  . Anxiety 05/14/2015  . Depression, major, single episode, in partial remission ([place]) 05/14/2015  . DDD (degenerative disc disease), lumbar 05/14/2015  . Centrilobular emphysema ([place]) 05/14/2015  . Hyperlipidemia 05/14/2015  . Gastroesophageal reflux disease 05/14/2015  . Radiculopathy, lumbar region 05/14/2015  . Sleep apnea 05/14/2015  . Obesity 05/14/2015  . Benign essential tremor 05/14/2015     Past Medical History:  Diagnosis Date  . Arthritis   . COPD (chronic obstructive pulmonary disease) ([place])   . Enlarged heart   . GERD (gastroesophageal reflux disease)   . Hiatal hernia   . Hx of degenerative disc disease   . Hyperlipidemia   . Hypertension   . Mass    On adrenal gland  . Osteoporosis   . Sleep apnea      Past Surgical History:  Procedure Laterality Date  . ABDOMINAL HYSTERECTOMY    . ACHILLES TENDON REPAIR     Removed bone spur and repaired achilles tendon  . APPENDECTOMY    . bone fusion in neck    .  COLONOSCOPY WITH PROPOFOL N/A 01/17/2019   Procedure: COLONOSCOPY WITH PROPOFOL;  Surgeon: Jonathon Bellows, MD;   Location: [facility] ENDOSCOPY;  Service: Gastroenterology;  Laterality: N/A;  . HERNIA REPAIR     has not had repair  . TONSILLECTOMY      Social History   Socioeconomic History  . Marital status: Widowed    Spouse name: Not on file  . Number of children: Not on file  . Years of education: Not on file  . Highest education level: Not on file  Occupational History  . Occupation: disabled  Tobacco Use  . Smoking status: Current Every Day Smoker    Packs/day: 1.00    Years: 48.00    Pack years: 48.00    Types: Cigarettes  . Smokeless tobacco: Never Used  . Tobacco comment: pt states she plans to begin using patch to quit smoking  Vaping Use  . Vaping Use: Never used  Substance and Sexual Activity  . Alcohol use: No    Alcohol/week: 0.0 standard drinks  . Drug use: No  . Sexual activity: Never    Partners: Male  Other Topics Concern  . Not on file  Social History Narrative  . Not on file   Social Determinants of Health   Financial Resource Strain: High Risk  . Difficulty of Paying Living Expenses: Hard  Food Insecurity:   . Worried About Charity fundraiser in the Last Year: Not on file  . Ran Out of Food in the Last Year: Not on file  Transportation Needs:   . Lack of Transportation (Medical): Not on file  . Lack of Transportation (Non-Medical): Not on file  Physical Activity:   . Days of Exercise per Week: Not on file  . Minutes of Exercise per Session: Not on file  Stress:   . Feeling of Stress : Not on file  Social Connections:   . Frequency of Communication with Friends and Family: Not on file  . Frequency of Social Gatherings with Friends and Family: Not on file  . Attends Religious Services: Not on file  . Active Member of Clubs or Organizations: Not on file  . Attends Archivist Meetings: Not on file  . Marital Status: Not on file  Intimate Partner Violence:   . Fear of Current or Ex-Partner: Not on file  . Emotionally Abused: Not on file  .  Physically Abused: Not on file  . Sexually Abused: Not on file     Family History  Problem Relation Age of Onset  . COPD Father   . Heart disease Father   . Hyperlipidemia Father   . Hypertension Father   . Dementia Father   . Cancer Sister   . Hyperlipidemia Sister   . Hypertension Sister   . Lymphoma Sister   . Migraines Son   . Stroke Maternal Grandfather   . Heart attack Brother   . Cancer Brother        colorectal and liver  . Breast cancer Paternal Aunt   . Breast cancer Paternal Aunt      Current Outpatient Medications:  .  acetaminophen (TYLENOL) 500 MG tablet, Take 1,000 mg by mouth in the morning and at bedtime., Disp: , Rfl:  .  albuterol (PROAIR HFA) 108 (90 Base) MCG/ACT inhaler, Inhale 2 puffs into the lungs every 6 (six) hours as needed for wheezing or shortness of breath., Disp: 8.5 g, Rfl: 5 .  aspirin 81 MG tablet, Take 81 mg by mouth daily.,  Disp: , Rfl:  .  atorvastatin (LIPITOR) 40 MG tablet, Take 1 tablet (40 mg total) by mouth daily., Disp: 90 tablet, Rfl: 4 .  busPIRone (BUSPAR) 10 MG tablet, Take 2 tablets (20 mg total) by mouth 3 (three) times daily., Disp: 180 tablet, Rfl: 12 .  Calcium Citrate-Vitamin D (CALCIUM + D PO), Take 1,000 mg by mouth daily., Disp: , Rfl:  .  DULoxetine (CYMBALTA) 60 MG capsule, Take 1 capsule (60 mg total) by mouth daily., Disp: 90 capsule, Rfl: 4 .  furosemide (LASIX) 20 MG tablet, Take 1 tablet (20 mg total) by mouth daily., Disp: 90 tablet, Rfl: 4 .  gabapentin (NEURONTIN) 800 MG tablet, Take 1 tablet (800 mg total) by mouth 3 (three) times daily., Disp: 360 tablet, Rfl: 4 .  hydrOXYzine (ATARAX/VISTARIL) 10 MG tablet, Take 1 tablet (10 mg total) by mouth 3 (three) times daily as needed., Disp: 90 tablet, Rfl: 2 .  lansoprazole (PREVACID) 15 MG capsule, Take 15 mg by mouth daily., Disp: , Rfl:  .  metFORMIN (GLUCOPHAGE) 500 MG tablet, Take 1 tablet (500 mg total) by mouth 3 (three) times daily with meals., Disp: 270  tablet, Rfl: 4 .  Multiple Vitamins-Minerals (MULTIVITAMIN WITH MINERALS) tablet, Take 1 tablet by mouth daily., Disp: , Rfl:  .  potassium chloride SA (KLOR-CON) 20 MEQ tablet, TAKE 1  BY MOUTH ONCE DAILY, Disp: 90 tablet, Rfl: 3 .  SPIRIVA RESPIMAT 2.5 MCG/ACT AERS, INHALE 2 SPRAY(S) BY MOUTH ONCE DAILY, Disp: 4 g, Rfl: 5 .  telmisartan-hydrochlorothiazide (MICARDIS HCT) 40-12.5 MG tablet, Take 1 tablet by mouth daily., Disp: 90 tablet, Rfl: 4 .  tiZANidine (ZANAFLEX) 4 MG capsule, Take 1 capsule (4 mg total) by mouth 3 (three) times daily., Disp: 90 capsule, Rfl: 0 .  vitamin B-12 (CYANOCOBALAMIN) 1000 MCG tablet, Take 1,000 mcg by mouth daily., Disp: , Rfl:  .  ascorbic acid (VITAMIN C) 500 MG tablet, Take 500 mg by mouth daily. (Patient not taking: Reported on 09/03/2020), Disp: , Rfl:    Physical exam:  Vitals:   10/29/20 1506  BP: (!) 112/53  Pulse: 87  Temp: 99.3 F (37.4 C)  TempSrc: Tympanic  SpO2: 95%  Weight: 240 lb 12.8 oz (109.2 kg)   Physical Exam Constitutional:      Comments: Sitting in a wheelchair.  Appears fatigued  Cardiovascular:     Rate and Rhythm: Normal rate and regular rhythm.     Heart sounds: Normal heart sounds.  Pulmonary:     Effort: Pulmonary effort is normal.     Breath sounds: Normal breath sounds.  Abdominal:     General: Bowel sounds are normal.     Palpations: Abdomen is soft.     Comments: No palpable splenomegaly  Lymphadenopathy:     Comments: No palpable cervical, supraclavicular, axillary or inguinal adenopathy   Skin:    General: Skin is warm and dry.  Neurological:     Mental Status: She is alert and oriented to person, place, and time.        CMP Latest Ref Rng & Units 09/03/2020  Glucose 65 - 99 mg/dL 114(H)  BUN 8 - 27 mg/dL 14  Creatinine 0.57 - 1.00 mg/dL 1.07(H)  Sodium 134 - 144 mmol/L 141  Potassium 3.5 - 5.2 mmol/L 5.0  Chloride 96 - 106 mmol/L 102  CO2 20 - 29 mmol/L 23  Calcium 8.7 - 10.3 mg/dL 10.1  Total  Protein 6.0 - 8.5 g/dL -  Total Bilirubin  0.0 - 1.2 mg/dL -  Alkaline Phos 39 - 117 IU/L -  AST 0 - 40 IU/L -  ALT 0 - 32 IU/L -   CBC Latest Ref Rng & Units 10/29/2020  WBC 4.0 - 10.5 K/uL 12.7(H)  Hemoglobin 12.0 - 15.0 g/dL 12.5  Hematocrit 36 - 46 % 38.4  Platelets 150 - 400 K/uL 308    No images are attached to the encounter.  CT CHEST LUNG CANCER SCREENING LOW DOSE WO CONTRAST  Result Date: 10/11/2020 CLINICAL DATA:  63 year old asymptomatic female current smoker with 50 pack-year smoking history. EXAM: CT CHEST WITHOUT CONTRAST LOW-DOSE FOR LUNG CANCER SCREENING TECHNIQUE: Multidetector CT imaging of the chest was performed following the standard protocol without IV contrast. COMPARISON:  09/29/2019 screening chest CT. FINDINGS: Cardiovascular: Normal heart size. No significant pericardial effusion/thickening. Three-vessel coronary atherosclerosis. Atherosclerotic nonaneurysmal thoracic aorta. Normal caliber pulmonary arteries. Mediastinum/Nodes: No discrete thyroid nodules. Unremarkable esophagus. No pathologically enlarged axillary, mediastinal or hilar lymph nodes, noting limited sensitivity for the detection of hilar adenopathy on this noncontrast study. Lungs/Pleura: No pneumothorax. No pleural effusion. Mild centrilobular emphysema with mild diffuse bronchial wall thickening. No acute consolidative airspace disease or lung masses. No significant growth of previously visualized scattered pulmonary nodules. No new significant pulmonary nodules. Upper abdomen: Stable 3.3 cm right adrenal nodule with density 13 HU, compatible with an adenoma. Musculoskeletal: No aggressive appearing focal osseous lesions. Marked thoracic spondylosis. Surgical hardware from ACD[facility] is seen in the lower cervical spine. IMPRESSION: 1. Lung-RADS 2, benign appearance or behavior. Continue annual screening with low-dose chest CT without contrast in 12 months. 2. Three-vessel coronary atherosclerosis. 3. Stable  right adrenal adenoma. 4. Aortic Atherosclerosis (ICD10-I70.0) and Emphysema (ICD10-J43.9). Electronically Signed   By: Ilona Sorrel M.D.   On: 10/11/2020 08:28    Assessment and plan- Patient is a 64 y.o. female referred for leukocytosis/lymphocytosis  Patient has had longstanding lymphocytosis monocytosis and neutrophilia with a white count fluctuates between 11-13 over the last 5 years.  More recently patient CBC showed a white count of 16 which was higher than her baseline and therefore referred to [place].  ESR and CRP was normal.I suspect her leukocytosis is reactive secondary to her smoking.  She does not have any other cytopenias.  There has been no consistent rise in her white cell count either to suggest a myeloproliferative disorder.  Today I will check a CBC with differential, smear review, flow cytometry as well as BCR ABL FISH testing.  I will see the patient in 2 weeks time to discuss the results of her blood work.   Thank you for this kind referral and the opportunity to participate in the care of this patient   Visit Diagnosis 1. Monocytosis   2. Lymphocytosis     Dr. Randa Evens, MD, MPH [facility] at [facility] [phone] 10/31/2020

## 2020-11-01 LAB — BCR-ABL1 FISH
Cells Analyzed: 200
Cells Counted: 200

## 2020-11-08 ENCOUNTER — Other Ambulatory Visit: Payer: Self-pay | Admitting: Nurse Practitioner

## 2020-11-08 DIAGNOSIS — Z1231 Encounter for screening mammogram for malignant neoplasm of breast: Secondary | ICD-10-CM

## 2020-11-08 NOTE — Telephone Encounter (Signed)
Requested medications are due for refill today yes  Requested medications are on the active medication list yes  Last refill 11/17  Future visit scheduled 02/2021  Notes to clinic Not Delegated.

## 2020-11-08 NOTE — Chronic Care Management (AMB) (Signed)
  Care Management   Note  11/08/2020 Name: SACHIKO METHOT MRN: [id number] DOB: [birth year]  CHELLE CAYTON is a 64 y.o. year old female who is a primary care patient of Venita Lick, NP and is actively engaged with the care management team. I reached out to [name] by phone today to assist with re-scheduling a follow up visit with the Pharmacist  Follow up plan: Unsuccessful telephone outreach attempt made. A HIPAA compliant phone message was left for the patient providing contact information and requesting a return call.  The care management team will reach out to the patient again over the next 7 days.  If patient returns call to provider office, please advise to call [facility] at [place], [place], [place], [place] [address] Direct Dial: [phone] [email or web address]

## 2020-11-11 NOTE — Telephone Encounter (Signed)
Routing to provider  

## 2020-11-18 ENCOUNTER — Other Ambulatory Visit: Payer: Self-pay

## 2020-11-18 ENCOUNTER — Inpatient Hospital Stay: Payer: 59 | Attending: Oncology | Admitting: Oncology

## 2020-11-18 ENCOUNTER — Telehealth: Payer: Self-pay

## 2020-11-18 DIAGNOSIS — Z79899 Other long term (current) drug therapy: Secondary | ICD-10-CM | POA: Insufficient documentation

## 2020-11-18 DIAGNOSIS — D72821 Monocytosis (symptomatic): Secondary | ICD-10-CM | POA: Insufficient documentation

## 2020-11-18 DIAGNOSIS — D72820 Lymphocytosis (symptomatic): Secondary | ICD-10-CM | POA: Insufficient documentation

## 2020-11-18 DIAGNOSIS — F17210 Nicotine dependence, cigarettes, uncomplicated: Secondary | ICD-10-CM | POA: Insufficient documentation

## 2020-11-18 NOTE — Progress Notes (Signed)
I connected with [name] on 11/18/20 at 10:30 AM EST by video enabled telemedicine visit and verified that I am speaking with the correct person using two identifiers.   I discussed the limitations, risks, security and privacy concerns of performing an evaluation and management service by telemedicine and the availability of in-person appointments. I also discussed with the patient that there may be a patient responsible charge related to this service. The patient expressed understanding and agreed to proceed.  Other persons participating in the visit and their role in the encounter:  none  Patient's location:  home Provider's location:  work  Risk analyst Complaint: Discuss results of blood work  History of present illness: Patient is a 64 year old female with a past medical history significant for GERD, hypertension, COPD, sleep apnea referred for leukocytosis.  Looking back at her prior CBCs patient has had a mildly elevated white cell count that fluctuates between 11-16 over the last 6 years.  Differential is mainly showed neutrophilia and lymphocytosis as well as monocytosis.  Hemoglobin is remained stable between 12-13 and platelet counts have been normal.  Patient does have baseline shortness of breath and fatigue from her underlying COPD.  Appetite and weight have remained stable.  Denies any drenching night sweats.  Results of blood work from 10/29/2020 were as follows: CBC showed white count of 12.7, H&H of 12.5/38.4 and a platelet count of 308.  Differential showed mild monocytosis with an absolute monocyte count of 1.2.  Flow cytometry was otherwise unremarkable and did not show any immunophenotypic abnormality.  Kappa lambda ratio was 3.1.  BCR ABL FISH testing was negative.  Interval history: No recent changes in her health since last visit.  Other than fatigue and baseline shortness of breath from COPD she reports no specific complaints at this time.   Review of Systems   Constitutional: Positive for malaise/fatigue. Negative for chills, fever and weight loss.  HENT: Negative for congestion, ear discharge and nosebleeds.   Eyes: Negative for blurred vision.  Respiratory: Positive for shortness of breath. Negative for cough, hemoptysis, sputum production and wheezing.   Cardiovascular: Negative for chest pain, palpitations, orthopnea and claudication.  Gastrointestinal: Negative for abdominal pain, blood in stool, constipation, diarrhea, heartburn, melena, nausea and vomiting.  Genitourinary: Negative for dysuria, flank pain, frequency, hematuria and urgency.  Musculoskeletal: Negative for back pain, joint pain and myalgias.  Skin: Negative for rash.  Neurological: Negative for dizziness, tingling, focal weakness, seizures, weakness and headaches.  Endo/Heme/Allergies: Does not bruise/bleed easily.  Psychiatric/Behavioral: Negative for depression and suicidal ideas. The patient does not have insomnia.     No Known Allergies  Past Medical History:  Diagnosis Date   Arthritis    COPD (chronic obstructive pulmonary disease) (H[facility])    Enlarged heart    GERD (gastroesophageal reflux disease)    Hiatal hernia    Hx of degenerative disc disease    Hyperlipidemia    Hypertension    Mass    On adrenal gland   Osteoporosis    Sleep apnea     Past Surgical History:  Procedure Laterality Date   ABDOMINAL HYSTERECTOMY     ACHILLES TENDON REPAIR     Removed bone spur and repaired achilles tendon   APPENDECTOMY     bone fusion in neck     COLONOSCOPY WITH PROPOFOL N/A 01/17/2019   Procedure: COLONOSCOPY WITH PROPOFOL;  Surgeon: Jonathon Bellows, MD;  Location: [facility] ENDOSCOPY;  Service: Gastroenterology;  Laterality: N/A;   HERNIA REPAIR  has not had repair   TONSILLECTOMY      Social History   Socioeconomic History   Marital status: Widowed    Spouse name: Not on file   Number of children: Not on file   Years of education: Not on  file   Highest education level: Not on file  Occupational History   Occupation: disabled  Tobacco Use   Smoking status: Current Every Day Smoker    Packs/day: 1.00    Years: 48.00    Pack years: 48.00    Types: Cigarettes   Smokeless tobacco: Never Used   Tobacco comment: pt states she plans to begin using patch to quit smoking  Vaping Use   Vaping Use: Never used  Substance and Sexual Activity   Alcohol use: No    Alcohol/week: 0.0 standard drinks   Drug use: No   Sexual activity: Never    Partners: Male  Other Topics Concern   Not on file  Social History Narrative   Not on file   Social Determinants of Health   Financial Resource Strain: High Risk   Difficulty of Paying Living Expenses: Hard  Food Insecurity: Not on file  Transportation Needs: Not on file  Physical Activity: Not on file  Stress: Not on file  Social Connections: Not on file  Intimate Partner Violence: Not on file    Family History  Problem Relation Age of Onset   COPD Father    Heart disease Father    Hyperlipidemia Father    Hypertension Father    Dementia Father    Cancer Sister    Hyperlipidemia Sister    Hypertension Sister    Lymphoma Sister    Migraines Son    Stroke Maternal Grandfather    Heart attack Brother    Cancer Brother        colorectal and liver   Breast cancer Paternal Aunt    Breast cancer Paternal Aunt      Current Outpatient Medications:    acetaminophen (TYLENOL) 500 MG tablet, Take 1,000 mg by mouth in the morning and at bedtime., Disp: , Rfl:    albuterol (PROAIR HFA) 108 (90 Base) MCG/ACT inhaler, Inhale 2 puffs into the lungs every 6 (six) hours as needed for wheezing or shortness of breath., Disp: 8.5 g, Rfl: 5   aspirin 81 MG tablet, Take 81 mg by mouth daily., Disp: , Rfl:    atorvastatin (LIPITOR) 40 MG tablet, Take 1 tablet (40 mg total) by mouth daily., Disp: 90 tablet, Rfl: 4   busPIRone (BUSPAR) 10 MG tablet, Take 2  tablets (20 mg total) by mouth 3 (three) times daily., Disp: 180 tablet, Rfl: 12   Calcium Citrate-Vitamin D (CALCIUM + D PO), Take 1,000 mg by mouth daily., Disp: , Rfl:    DULoxetine (CYMBALTA) 60 MG capsule, Take 1 capsule (60 mg total) by mouth daily., Disp: 90 capsule, Rfl: 4   furosemide (LASIX) 20 MG tablet, Take 1 tablet (20 mg total) by mouth daily., Disp: 90 tablet, Rfl: 4   gabapentin (NEURONTIN) 800 MG tablet, Take 1 tablet (800 mg total) by mouth 3 (three) times daily., Disp: 360 tablet, Rfl: 4   hydrOXYzine (ATARAX/VISTARIL) 10 MG tablet, Take 1 tablet (10 mg total) by mouth 3 (three) times daily as needed., Disp: 90 tablet, Rfl: 2   lansoprazole (PREVACID) 15 MG capsule, Take 15 mg by mouth daily., Disp: , Rfl:    metFORMIN (GLUCOPHAGE) 500 MG tablet, Take 1 tablet (500 mg total)  by mouth 3 (three) times daily with meals., Disp: 270 tablet, Rfl: 4   Multiple Vitamins-Minerals (MULTIVITAMIN WITH MINERALS) tablet, Take 1 tablet by mouth daily., Disp: , Rfl:    potassium chloride SA (KLOR-CON) 20 MEQ tablet, TAKE 1  BY MOUTH ONCE DAILY, Disp: 90 tablet, Rfl: 3   SPIRIVA RESPIMAT 2.5 MCG/ACT AERS, INHALE 2 SPRAY(S) BY MOUTH ONCE DAILY, Disp: 4 g, Rfl: 3   telmisartan-hydrochlorothiazide (MICARDIS HCT) 40-12.5 MG tablet, Take 1 tablet by mouth daily., Disp: 90 tablet, Rfl: 4   tiZANidine (ZANAFLEX) 4 MG capsule, TAKE 1 CAPSULE BY MOUTH THREE TIMES DAILY, Disp: 90 capsule, Rfl: 4   vitamin B-12 (CYANOCOBALAMIN) 1000 MCG tablet, Take 1,000 mcg by mouth daily., Disp: , Rfl:    ascorbic acid (VITAMIN C) 500 MG tablet, Take 500 mg by mouth daily. (Patient not taking: No sig reported), Disp: , Rfl:   No results found.  No images are attached to the encounter.   CMP Latest Ref Rng & Units 09/03/2020  Glucose 65 - 99 mg/dL 114(H)  BUN 8 - 27 mg/dL 14  Creatinine 0.57 - 1.00 mg/dL 1.07(H)  Sodium 134 - 144 mmol/L 141  Potassium 3.5 - 5.2 mmol/L 5.0  Chloride 96 - 106 mmol/L  102  CO2 20 - 29 mmol/L 23  Calcium 8.7 - 10.3 mg/dL 10.1  Total Protein 6.0 - 8.5 g/dL -  Total Bilirubin 0.0 - 1.2 mg/dL -  Alkaline Phos 39 - 117 IU/L -  AST 0 - 40 IU/L -  ALT 0 - 32 IU/L -   CBC Latest Ref Rng & Units 10/29/2020  WBC 4.0 - 10.5 K/uL 12.7(H)  Hemoglobin 12.0 - 15.0 g/dL 12.5  Hematocrit 36.0 - 46.0 % 38.4  Platelets 150 - 400 K/uL 308     Observation/objective: Appears in no acute distress over video visit today.  Breathing is nonlabored  Assessment and plan: Patient is a 64 year old female referred for leukocytosis mainly neutrophilia with intermittent monocytosis.  This is a visit to discuss results of blood work  Results of flow cytometry and BCR ABL FISH testing were unremarkable.  Patient has mild leukocytosis with predominant neutrophilia and intermittent monocytosis which I think is secondary to her smoking.  She does not require a bone marrow biopsy at this time and this can be monitored conservatively with a repeat CBC with differential in 6 months  Follow-up instructions: As above  I discussed the assessment and treatment plan with the patient. The patient was provided an opportunity to ask questions and all were answered. The patient agreed with the plan and demonstrated an understanding of the instructions.   The patient was advised to call back or seek an in-person evaluation if the symptoms worsen or if the condition fails to improve as anticipated.  Visit Diagnosis: 1. Monocytosis   2. Lymphocytosis     Dr. Randa Evens, MD, MPH [facility] at [facility] Tel- [phone] 11/18/2020 2:53 PM

## 2020-11-20 NOTE — Chronic Care Management (AMB) (Signed)
  Care Management   Note  11/20/2020 Name: NISHITA ISAACKS MRN: [id number] DOB: December 05, 1955  TARAHJI RAMTHUN is a 64 y.o. year old female who is a primary care patient of Venita Lick, NP and is actively engaged with the care management team. I reached out to [name] by phone today to assist with re-scheduling a follow up visit with the Pharmacist  Follow up plan: Unable to make contact on outreach attempts x 3. PCP Marnee Guarneri, NP notified via routed documentation in medical record.   Noreene Larsson, [place], [place], [place] [address] Direct Dial: [phone] [email or web address]: [email or web address]

## 2020-12-03 ENCOUNTER — Other Ambulatory Visit: Payer: Self-pay | Admitting: Nurse Practitioner

## 2020-12-03 MED ORDER — OMEPRAZOLE 40 MG PO CPDR
40.0000 mg | DELAYED_RELEASE_CAPSULE | Freq: Every day | ORAL | 3 refills | Status: DC
Start: 2020-12-03 — End: 2021-09-03

## 2020-12-03 NOTE — Telephone Encounter (Signed)
Routing to provider  

## 2020-12-16 ENCOUNTER — Other Ambulatory Visit: Payer: Self-pay | Admitting: Nurse Practitioner

## 2020-12-24 ENCOUNTER — Ambulatory Visit
Admission: RE | Admit: 2020-12-24 | Discharge: 2020-12-24 | Disposition: A | Discharge status: Home / Self Care | Payer: PPO | Source: Ambulatory Visit | Attending: Nurse Practitioner | Admitting: Nurse Practitioner

## 2020-12-24 ENCOUNTER — Other Ambulatory Visit: Payer: Self-pay

## 2020-12-24 DIAGNOSIS — Z1231 Encounter for screening mammogram for malignant neoplasm of breast: Secondary | ICD-10-CM | POA: Diagnosis not present

## 2020-12-25 ENCOUNTER — Other Ambulatory Visit: Payer: Self-pay | Admitting: Nurse Practitioner

## 2020-12-29 DIAGNOSIS — G4733 Obstructive sleep apnea (adult) (pediatric): Secondary | ICD-10-CM | POA: Diagnosis not present

## 2020-12-30 ENCOUNTER — Telehealth: Payer: Self-pay

## 2020-12-30 ENCOUNTER — Telehealth: Payer: Self-pay | Admitting: Licensed Clinical Social Worker

## 2020-12-30 NOTE — Telephone Encounter (Signed)
  Chronic Care Management    Clinical Social Work General Follow Up Note  12/30/2020 Name: [name] MRN: [id number] DOB: [birth year]  [name] is a 65 y.o. year old female who is a primary care patient of Cannady, Barbaraann Faster, NP. The CCM team was consulted for assistance with Mental Health Counseling and Resources.   Review of patient status, including review of consultants reports, relevant laboratory and other test results, and collaboration with appropriate care team members and the patient's provider was performed as part of comprehensive patient evaluation and provision of chronic care management services.    LCSW completed CCM outreach attempt today but was unable to reach patient successfully. A HIPPA compliant voice message was left encouraging patient to return call once available. LCSW will ask Scheduling Care Guide to reschedule CCM SW appointment with patient as well.  Outpatient Encounter Medications as of 12/30/2020  Medication Sig  . acetaminophen (TYLENOL) 500 MG tablet Take 1,000 mg by mouth in the morning and at bedtime.  Marland Kitchen albuterol (PROAIR HFA) 108 (90 Base) MCG/ACT inhaler Inhale 2 puffs into the lungs every 6 (six) hours as needed for wheezing or shortness of breath.  Marland Kitchen ascorbic acid (VITAMIN C) 500 MG tablet Take 500 mg by mouth daily. (Patient not taking: No sig reported)  . aspirin 81 MG tablet Take 81 mg by mouth daily.  Marland Kitchen atorvastatin (LIPITOR) 40 MG tablet Take 1 tablet (40 mg total) by mouth daily.  . busPIRone (BUSPAR) 10 MG tablet Take 2 tablets (20 mg total) by mouth 3 (three) times daily.  . Calcium Citrate-Vitamin D (CALCIUM + D PO) Take 1,000 mg by mouth daily.  . DULoxetine (CYMBALTA) 60 MG capsule Take 1 capsule (60 mg total) by mouth daily.  . furosemide (LASIX) 20 MG tablet Take 1 tablet (20 mg total) by mouth daily.  Marland Kitchen gabapentin (NEURONTIN) 800 MG tablet Take 1 tablet (800 mg total) by mouth 3 (three) times daily.  . hydrOXYzine  (ATARAX/VISTARIL) 10 MG tablet Take 1 tablet by mouth three times daily as needed  . metFORMIN (GLUCOPHAGE) 500 MG tablet Take 1 tablet (500 mg total) by mouth 3 (three) times daily with meals.  . Multiple Vitamins-Minerals (MULTIVITAMIN WITH MINERALS) tablet Take 1 tablet by mouth daily.  Marland Kitchen omeprazole (PRILOSEC) 40 MG capsule Take 1 capsule (40 mg total) by mouth daily.  . potassium chloride SA (KLOR-CON) 20 MEQ tablet Take 1 tablet by mouth once daily  . SPIRIVA RESPIMAT 2.5 MCG/ACT AERS INHALE 2 SPRAY(S) BY MOUTH ONCE DAILY  . telmisartan-hydrochlorothiazide (MICARDIS HCT) 40-12.5 MG tablet Take 1 tablet by mouth daily.  Marland Kitchen tiZANidine (ZANAFLEX) 4 MG capsule TAKE 1 CAPSULE BY MOUTH THREE TIMES DAILY  . vitamin B-12 (CYANOCOBALAMIN) 1000 MCG tablet Take 1,000 mcg by mouth daily.   No facility-administered encounter medications on file as of 12/30/2020.    Follow Up Plan: [facility] will reach out to patient to reschedule appointment.   Eula Fried, BSW, MSW, [facility] Care Management [email or web address] Phone: [phone]

## 2021-01-07 ENCOUNTER — Telehealth: Payer: Self-pay

## 2021-01-07 NOTE — Chronic Care Management (AMB) (Signed)
  Care Management   Note  01/07/2021 Name: [name] MRN: [id number] DOB: 04-06-1956  [name] is a 65 y.o. year old female who is a primary care patient of Venita Lick, NP and is actively engaged with the care management team. I reached out to [name] by phone today to assist with re-scheduling a follow up visit with the Licensed Clinical Social Worker  Follow up plan: Unsuccessful telephone outreach attempt made. A HIPAA compliant phone message was left for the patient providing contact information and requesting a return call.  The care management team will reach out to the patient again over the next 7 days.  If patient returns call to provider office, please advise to call [facility]  at [place], [place], [place], [place] [address] Direct Dial: [phone] [email or web address] Website: [email or web address]

## 2021-01-13 ENCOUNTER — Other Ambulatory Visit: Payer: Self-pay | Admitting: Nurse Practitioner

## 2021-01-22 NOTE — Chronic Care Management (AMB) (Signed)
  Care Management   Note  01/22/2021 Name: KAILEAH SHEVCHENKO MRN: [id number] DOB: July 03, 1956  MARCELLE HEPNER is a 65 y.o. year old female who is a primary care patient of Venita Lick, NP and is actively engaged with the care management team. I reached out to [name] by phone today to assist with re-scheduling a follow up visit with the Licensed Clinical Social Worker  Follow up plan: Telephone appointment with care management team member scheduled for:02/17/2021  Noreene Larsson, [place], [place] [facility]  [place], [place] [address] Direct Dial: [phone] [email or web address] Website: [email or web address]

## 2021-01-27 DIAGNOSIS — G4733 Obstructive sleep apnea (adult) (pediatric): Secondary | ICD-10-CM | POA: Diagnosis not present

## 2021-01-31 NOTE — Telephone Encounter (Signed)
Patient has been rescheduled.

## 2021-02-10 ENCOUNTER — Other Ambulatory Visit: Payer: Self-pay | Admitting: Nurse Practitioner

## 2021-02-17 ENCOUNTER — Ambulatory Visit (INDEPENDENT_AMBULATORY_CARE_PROVIDER_SITE_OTHER): Payer: PPO | Admitting: Licensed Clinical Social Worker

## 2021-02-17 DIAGNOSIS — F324 Major depressive disorder, single episode, in partial remission: Secondary | ICD-10-CM

## 2021-02-17 DIAGNOSIS — E782 Mixed hyperlipidemia: Secondary | ICD-10-CM

## 2021-02-17 DIAGNOSIS — Z6841 Body Mass Index (BMI) 40.0 and over, adult: Secondary | ICD-10-CM

## 2021-02-17 DIAGNOSIS — N1831 Chronic kidney disease, stage 3a: Secondary | ICD-10-CM | POA: Diagnosis not present

## 2021-02-17 NOTE — Chronic Care Management (AMB) (Signed)
Chronic Care Management    Clinical Social Work Note  02/17/2021 Name: [name] MRN: [id number] DOB: 07-23-1956  [name] is a 65 y.o. year old female who is a primary care patient of Cannady, Dorie Rank, NP. The CCM team was consulted to assist the patient with chronic disease management and/or care coordination needs related to: Mental Health Counseling and Resources.   Engaged with patient by telephone for follow up visit in response to provider referral for social work chronic care management and care coordination services.   Consent to Services:  The patient was given the following information about Chronic Care Management services today, agreed to services, and gave verbal consent: 1. CCM service includes personalized support from designated clinical staff supervised by the primary care provider, including individualized plan of care and coordination with other care providers 2. 24/7 contact phone numbers for assistance for urgent and routine care needs. 3. Service will only be billed when office clinical staff spend 20 minutes or more in a month to coordinate care. 4. Only one practitioner may furnish and bill the service in a calendar month. 5.The patient may stop CCM services at any time (effective at the end of the month) by phone call to the office staff. 6. The patient will be responsible for cost sharing (co-pay) of up to 20% of the service fee (after annual deductible is met). Patient agreed to services and consent obtained.  Patient agreed to services and consent obtained.   Assessment: Review of patient past medical history, allergies, medications, and health status, including review of relevant consultants reports was performed today as part of a comprehensive evaluation and provision of chronic care management and care coordination services.     SDOH (Social Determinants of Health) assessments and interventions performed:    Advanced Directives Status: See Care Plan  for related entries.  CCM Care Plan  No Known Allergies  Outpatient Encounter Medications as of 02/17/2021  Medication Sig  . acetaminophen (TYLENOL) 500 MG tablet Take 1,000 mg by mouth in the morning and at bedtime.  Marland Kitchen albuterol (PROAIR HFA) 108 (90 Base) MCG/ACT inhaler Inhale 2 puffs into the lungs every 6 (six) hours as needed for wheezing or shortness of breath.  Marland Kitchen ascorbic acid (VITAMIN C) 500 MG tablet Take 500 mg by mouth daily. (Patient not taking: No sig reported)  . aspirin 81 MG tablet Take 81 mg by mouth daily.  Marland Kitchen atorvastatin (LIPITOR) 40 MG tablet Take 1 tablet (40 mg total) by mouth daily.  . busPIRone (BUSPAR) 10 MG tablet Take 2 tablets (20 mg total) by mouth 3 (three) times daily.  . Calcium Citrate-Vitamin D (CALCIUM + D PO) Take 1,000 mg by mouth daily.  . DULoxetine (CYMBALTA) 60 MG capsule Take 1 capsule (60 mg total) by mouth daily.  . furosemide (LASIX) 20 MG tablet Take 1 tablet (20 mg total) by mouth daily.  Marland Kitchen gabapentin (NEURONTIN) 800 MG tablet Take 1 tablet (800 mg total) by mouth 3 (three) times daily.  . hydrOXYzine (ATARAX/VISTARIL) 10 MG tablet Take 1 tablet by mouth three times daily as needed  . metFORMIN (GLUCOPHAGE) 500 MG tablet Take 1 tablet (500 mg total) by mouth 3 (three) times daily with meals.  . Multiple Vitamins-Minerals (MULTIVITAMIN WITH MINERALS) tablet Take 1 tablet by mouth daily.  Marland Kitchen omeprazole (PRILOSEC) 40 MG capsule Take 1 capsule (40 mg total) by mouth daily.  . potassium chloride SA (KLOR-CON) 20 MEQ tablet Take 1 tablet by mouth once  daily  . SPIRIVA RESPIMAT 2.5 MCG/ACT AERS INHALE 2 SPRAY(S) BY MOUTH ONCE DAILY  . telmisartan-hydrochlorothiazide (MICARDIS HCT) 40-12.5 MG tablet Take 1 tablet by mouth daily.  Marland Kitchen tiZANidine (ZANAFLEX) 4 MG capsule TAKE 1 CAPSULE BY MOUTH THREE TIMES DAILY  . vitamin B-12 (CYANOCOBALAMIN) 1000 MCG tablet Take 1,000 mcg by mouth daily.   No facility-administered encounter medications on file as of  02/17/2021.    Patient Active Problem List   Diagnosis Date Noted  . Chronic kidney disease, stage 3a (HCC) 03/04/2020  . Aortic atherosclerosis (HCC) 10/03/2019  . IFG (impaired fasting glucose) 09/04/2019  . Vitamin D deficiency 12/29/2018  . Osteoarthritis of right hip 12/27/2017  . Advanced care planning/counseling discussion 06/07/2017  . Benign hypertension 10/28/2015  . Nicotine dependence, cigarettes, uncomplicated 07/26/2015  . Facet syndrome, lumbar 07/11/2015  . Spinal stenosis, lumbar region, with neurogenic claudication 07/11/2015  . Status post cervical spinal fusion 07/11/2015  . DJD (degenerative joint disease) of knee 07/11/2015  . Chronic pain 05/17/2015  . Anxiety 05/14/2015  . Depression, major, single episode, in partial remission (HCC) 05/14/2015  . DDD (degenerative disc disease), lumbar 05/14/2015  . Centrilobular emphysema (HCC) 05/14/2015  . Hyperlipidemia 05/14/2015  . Gastroesophageal reflux disease 05/14/2015  . Radiculopathy, lumbar region 05/14/2015  . Sleep apnea 05/14/2015  . Obesity 05/14/2015  . Benign essential tremor 05/14/2015    Conditions to be addressed/monitored: Anxiety and Depression; Mental Health Concerns   Care Plan : General Social Work (Adult)  Updates made by Gustavus Bryant, LCSW since 02/17/2021 12:00 AM    Problem: Quality of Life (General Plan of Care)     Long-Range Goal: Quality of Life Maintained   Start Date: 02/17/2021  Priority: Medium  Note:   Timeframe:  Long-Range Goal Priority:  Medium Start Date:   02/17/21                       Expected End Date:  05/20/21                    Follow Up Date- 60 days from 02/17/21   Current Barriers:  . Chronic Mental Health needs related to Panic Disorder and Depression . Financial constraints related to managing health care expenses . Limited social support . ADL IADL limitations . Mental Health Concerns  . Social Isolation . Inability to perform ADL's  independently . Inability to perform IADL's independently . Suicidal Ideation/Homicidal Ideation: No  Clinical Social Work Goal(s):  Marland Kitchen Over the next 120 days, patient will work with SW bi-monthly by telephone or in person to reduce or manage symptoms related to anxiety and depression. . Over the next 120 days, patient will demonstrate improved health management independence as evidenced by implementing healthy self-care into her daily routine such as: attending all medical appointments, deep breathing exercsies, taking time for self-reflection, grounding exercises to combat panic attacks, taking medications as prescribed, drinking water and daily exercise to improve mobility.  Interventions: . Patient interviewed and appropriate assessments performed in the past: PHQ 2 and PHQ 9 . Patient reports having a strong support system of family members. She shares that her son lives with her and he is disabled but he is able to assist her when needed. Patient's sister and brother also come daily to check on patient which provides patient ongoing socialization.  . Provided mental health counseling with regard to panic disorder and depression. LCSW discussed coping skills for anxiety such a grounding exercises  that she can use when panic arises. SW used empathetic and active and reflective listening, validated patient's feelings/concerns, and provided emotional support. LCSW provided self-care education to help manage her multiple health conditions and improve her mood.  . Provided patient with information about available mental health support within her nearby area. . Discussed plans with patient for ongoing care management follow up and provided patient with direct contact information for care management team . Assisted patient/caregiver with obtaining information about health plan benefits . Provided education and assistance to client regarding Advanced Directives. . Provided education to patient/caregiver  about Hospice and/or Palliative Care services . Encouraged patient to consider a mental health provider for long term follow up and therapy/counseling. Patient has experienced many losses (one being her spouse 3 years ago to cancer) and would greatly benefit from virtual therapy through [facility]. Patient was informed that this service is free of charge. Resource information provided but patient declined at this time.  . Patient continues to have insomnia and goes to bed at 4-5 am. Patient reports that she has been this way since her spouse passed 3 years ago. LCSW provided education on healthy sleep hygiene and what that looks like. LCSW encouraged patient to implement a night time routine into hier schedule that works best for her and that she is able to maintain. Advised patient to implement deep breathing/grounding/meditation/self-care exercises into her nightly routine to combat racing thoughts at night. LCSW encouraged patient to wake up at the same time each day, make her sleeping environment comfortable, exercise when able, to limit naps and to not eat or drink anything right before bed.  . Patient was informed that current CCM LCSW will be leaving position next month and her next CCM Social Work follow up visit will be with another LCSW. Patient was appreciative of support provided and receptive to news.  Marland Kitchen Emotional/Supportive Counseling provided during session  Patient Self Care Activities:  . Calls provider office for new concerns or questions . Ability for insight . Independent living . Motivation for treatment . Strong family or social support  Patient Coping Strengths:  . Supportive Relationships . Family . Hopefulness . Self Advocate . Able to Communicate Effectively  Patient Self Care Deficits:  . Lacks social connections . Chronic Pain and poor mobility   Please see past updates related to this goal by clicking on the "Past Updates" button in the selected goal     Task: Support and Maintain Acceptable Degree of Health, Comfort and Happiness   Note:   Care Management Activities:    - affirmation provided - community involvement promoted - expression of thoughts about present/future encouraged - patient strengths promoted - psychosocial concerns monitored - self-expression encouraged - sleep diary encouraged - sleep hygiene techniques encouraged - social relationships promoted - strategies to maintain hearing and/or vision promoted - strategies to maintain intimacy promoted - wellness behaviors promoted    Notes:       Follow Up Plan: SW will follow up with patient by phone over the next quarter      Eula Fried, BSW, MSW, [email or web address] Phone: [phone]

## 2021-02-17 NOTE — Patient Instructions (Signed)
Licensed Clinical Social Worker Visit Information  Goals we discussed today:  Goals Addressed              This Visit's Progress   .  SW "I have panic attack sometimes." (pt-stated)        Timeframe:  Long-Range Goal Priority:  Medium Start Date:   02/17/21                       Expected End Date:  05/20/21                    Follow Up Date- 60 days from 02/17/21   Current Barriers:  . Chronic Mental Health needs related to Panic Disorder and Depression . Financial constraints related to managing health care expenses . Limited social support . ADL IADL limitations . Mental Health Concerns  . Social Isolation . Inability to perform ADL's independently . Inability to perform IADL's independently . Suicidal Ideation/Homicidal Ideation: No  Clinical Social Work Goal(s):  Marland Kitchen Over the next 120 days, patient will work with SW  bi-monthly  by telephone or in person to reduce or manage symptoms related to anxiety and depression. . Over the next 120 days, patient will demonstrate improved health management independence as evidenced by implementing healthy self-care into her daily routine such as: attending all medical appointments, deep breathing exercsies, taking time for self-reflection, grounding exercises to combat panic attacks, taking medications as prescribed, drinking water and daily exercise to improve mobility.  Interventions: . Patient interviewed and appropriate assessments performed in the past: PHQ 2 and PHQ 9 . Patient reports having a strong support system of family members. She shares that her son lives with her and he is disabled but he is able to assist her when needed. Patient's sister and brother also come daily to check on patient which provides patient ongoing socialization.  . Provided mental health counseling with regard to panic disorder and depression. LCSW discussed coping skills for anxiety such a grounding exercises that she can use when panic arises. SW used  empathetic and active and reflective listening, validated patient's feelings/concerns, and provided emotional support. LCSW provided self-care education to help manage her multiple health conditions and improve her mood.  . Provided patient with information about available mental health support within her nearby area. . Discussed plans with patient for ongoing care management follow up and provided patient with direct contact information for care management team . Assisted patient/caregiver with obtaining information about health plan benefits . Provided education and assistance to client regarding Advanced Directives. . Provided education to patient/caregiver about Hospice and/or Palliative Care services . Encouraged patient to consider a mental health provider for long term follow up and therapy/counseling. Patient has experienced many losses (one being her spouse 3 years ago to cancer) and would greatly benefit from virtual therapy through [facility]. Patient was informed that this service is free of charge. Resource information provided but patient declined at this time.  . Patient continues to have insomnia and goes to bed at 4-5 am. Patient reports that she has been this way since her spouse passed 3 years ago. LCSW provided education on healthy sleep hygiene and what that looks like. LCSW encouraged patient to implement a night time routine into hier schedule that works best for her and that she is able to maintain. Advised patient to implement deep breathing/grounding/meditation/self-care exercises into her nightly routine to combat racing thoughts at night. LCSW encouraged patient to wake  up at the same time each day, make her sleeping environment comfortable, exercise when able, to limit naps and to not eat or drink anything right before bed.  Marland Kitchen Emotional/Supportive Counseling provided during session  Patient Self Care Activities:  . Calls provider office for new concerns or  questions . Ability for insight . Independent living . Motivation for treatment . Strong family or social support  Patient Coping Strengths:  . Supportive Relationships . Family . Hopefulness . Self Advocate . Able to Communicate Effectively  Patient Self Care Deficits:  . Lacks social connections . Chronic Pain and poor mobility   Please see past updates related to this goal by clicking on the "Past Updates" button in the selected goal

## 2021-02-22 ENCOUNTER — Other Ambulatory Visit: Payer: Self-pay | Admitting: Nurse Practitioner

## 2021-02-26 DIAGNOSIS — G4733 Obstructive sleep apnea (adult) (pediatric): Secondary | ICD-10-CM | POA: Diagnosis not present

## 2021-02-28 ENCOUNTER — Encounter: Payer: Self-pay | Admitting: Nurse Practitioner

## 2021-02-28 DIAGNOSIS — D72821 Monocytosis (symptomatic): Secondary | ICD-10-CM | POA: Insufficient documentation

## 2021-03-04 ENCOUNTER — Other Ambulatory Visit: Payer: Self-pay

## 2021-03-04 ENCOUNTER — Ambulatory Visit (INDEPENDENT_AMBULATORY_CARE_PROVIDER_SITE_OTHER): Payer: PPO | Admitting: Nurse Practitioner

## 2021-03-04 ENCOUNTER — Encounter: Payer: Self-pay | Admitting: Nurse Practitioner

## 2021-03-04 VITALS — BP 104/62 | HR 96 | Temp 98.8°F | Wt 255.6 lb

## 2021-03-04 DIAGNOSIS — N1831 Chronic kidney disease, stage 3a: Secondary | ICD-10-CM | POA: Diagnosis not present

## 2021-03-04 DIAGNOSIS — I10 Essential (primary) hypertension: Secondary | ICD-10-CM

## 2021-03-04 DIAGNOSIS — I700 Atherosclerosis of aorta: Secondary | ICD-10-CM | POA: Diagnosis not present

## 2021-03-04 DIAGNOSIS — Z6841 Body Mass Index (BMI) 40.0 and over, adult: Secondary | ICD-10-CM

## 2021-03-04 DIAGNOSIS — E782 Mixed hyperlipidemia: Secondary | ICD-10-CM | POA: Diagnosis not present

## 2021-03-04 DIAGNOSIS — G4733 Obstructive sleep apnea (adult) (pediatric): Secondary | ICD-10-CM | POA: Diagnosis not present

## 2021-03-04 DIAGNOSIS — F17210 Nicotine dependence, cigarettes, uncomplicated: Secondary | ICD-10-CM

## 2021-03-04 DIAGNOSIS — E538 Deficiency of other specified B group vitamins: Secondary | ICD-10-CM | POA: Diagnosis not present

## 2021-03-04 DIAGNOSIS — F324 Major depressive disorder, single episode, in partial remission: Secondary | ICD-10-CM

## 2021-03-04 DIAGNOSIS — Z23 Encounter for immunization: Secondary | ICD-10-CM | POA: Diagnosis not present

## 2021-03-04 DIAGNOSIS — K2100 Gastro-esophageal reflux disease with esophagitis, without bleeding: Secondary | ICD-10-CM

## 2021-03-04 DIAGNOSIS — R7301 Impaired fasting glucose: Secondary | ICD-10-CM

## 2021-03-04 DIAGNOSIS — J432 Centrilobular emphysema: Secondary | ICD-10-CM

## 2021-03-04 DIAGNOSIS — D72821 Monocytosis (symptomatic): Secondary | ICD-10-CM

## 2021-03-04 DIAGNOSIS — E278 Other specified disorders of adrenal gland: Secondary | ICD-10-CM | POA: Diagnosis not present

## 2021-03-04 DIAGNOSIS — E559 Vitamin D deficiency, unspecified: Secondary | ICD-10-CM

## 2021-03-04 DIAGNOSIS — F419 Anxiety disorder, unspecified: Secondary | ICD-10-CM | POA: Diagnosis not present

## 2021-03-04 LAB — URINALYSIS, ROUTINE W REFLEX MICROSCOPIC
Bilirubin, UA: NEGATIVE
Glucose, UA: NEGATIVE
Ketones, UA: NEGATIVE
Nitrite, UA: NEGATIVE
Protein,UA: NEGATIVE
RBC, UA: NEGATIVE
Specific Gravity, UA: 1.01 (ref 1.005–1.030)
Urobilinogen, Ur: 0.2 mg/dL (ref 0.2–1.0)
pH, UA: 5.5 (ref 5.0–7.5)

## 2021-03-04 LAB — MICROSCOPIC EXAMINATION: RBC, Urine: NONE SEEN /HPF (ref 0–2)

## 2021-03-04 NOTE — Assessment & Plan Note (Signed)
Recommend continued focus on health diet choices and regular physical activity (30 minutes 5 days a week).  Continue Metformin to assist with weight loss and renal dose as needed.

## 2021-03-04 NOTE — Patient Instructions (Signed)

## 2021-03-04 NOTE — Assessment & Plan Note (Signed)
Chronic, ongoing, noted on lung CT 10/02/2019.  Recommend she continue ASA and statin daily for prevention.  Recommend complete cessation of smoking. 

## 2021-03-04 NOTE — Assessment & Plan Note (Signed)
Chronic, stable with Omeprazole which offers her benefit.  Continue current medication regimen and check Mag level next visit.

## 2021-03-04 NOTE — Assessment & Plan Note (Signed)
Chronic, ongoing.  Continue current medication regimen and adjust as needed.  Not O2 dependent at this time.  Continue to recommend complete smoking cessation. Spirometry at next visit.  Consider referral to pulmonary if worsening.

## 2021-03-04 NOTE — Assessment & Plan Note (Signed)
Chronic, ongoing.  Continue current medication regimen and adjust as needed.  Buspar offering benefit -- recommend Melatonin at night for rest.

## 2021-03-04 NOTE — Assessment & Plan Note (Signed)
Continue supplement and check Vit D level today.

## 2021-03-04 NOTE — Assessment & Plan Note (Signed)
Check A1C today

## 2021-03-04 NOTE — Assessment & Plan Note (Signed)
Chronic, ongoing.  Continue statin and adjust as needed.  Labs today.

## 2021-03-04 NOTE — Assessment & Plan Note (Signed)
I have recommended complete cessation of tobacco use. I have discussed various options available for assistance with tobacco cessation including over the counter methods (Nicotine gum, patch and lozenges). We also discussed prescription options (Chantix, Nicotine Inhaler / Nasal Spray). The patient is not interested in pursuing any prescription tobacco cessation options at this time.  Continue annual lung screening. 

## 2021-03-04 NOTE — Assessment & Plan Note (Signed)
Noted on imaging, CT lung scans, since 2017, remaining stable in size at 3.3 cm.  Continue to monitor and consider referral if any changes.  CMP today.

## 2021-03-04 NOTE — Assessment & Plan Note (Signed)
Noted on past labs, continue collaboration with oncology at this time.

## 2021-03-04 NOTE — Assessment & Plan Note (Signed)
Chronic, ongoing.   Her current CPAP setting is 20.  She uses her CPAP 100% of the time, using it every night for 8 hours a night.  She is very dedicated to her CPAP use, which benefits her overall health, including her chronic conditions such as COPD, HTN, obesity.  Continue 100% CPAP use.

## 2021-03-04 NOTE — Assessment & Plan Note (Signed)
Chronic, ongoing.  BP below goal today.  Continue current medication regimen and adjust as needed.  Recommend she monitor BP at home daily and document for provider + focus on DASH diet.  Obtain CMP and TSH today.  Refills up to date.  Return to office in 6 months.

## 2021-03-04 NOTE — Progress Notes (Addendum)
BP 104/62   Pulse 96   Temp 98.8 F (37.1 C) (Oral)   Wt 255 lb 9.6 oz (115.9 kg)   LMP  (LMP Unknown)   SpO2 94%   BMI 45.28 kg/m    Subjective:    Patient ID: [name], female    DOB: 1956/01/01, 65 y.o.   MRN: [id number]  HPI: [name] is a 65 y.o. female  Chief Complaint  Patient presents with  . Hypertension  . Hyperlipidemia  . COPD  . B12  . Mood   HYPERTENSION / HYPERLIPIDEMIA Continues on Telmisartan-HCTZ 40-12.5, Lasix, K+, and Lipitor. Has been taking Metformin over past year to assist with weight loss and prediabetes, A1C over past years 5.7 to 5.9 -- last A1C 5.9%.  She currently CPAP and using this nightly. Satisfied with current treatment? yes Duration of hypertension: chronic BP monitoring frequency: not checking BP range:  BP medication side effects: no Duration of hyperlipidemia: chronic Cholesterol medication side effects: no Cholesterol supplements: none Medication compliance: good compliance Aspirin: yes Recent stressors: no Recurrent headaches: no Visual changes: no Palpitations: no Dyspnea: no Chest pain: no Lower extremity edema: no Dizzy/lightheaded: no   CHRONIC KIDNEY DISEASE On last labs noted CRT 1.07 and eGFR 55.  She does report over past 2 weeks occasional burning pain after urination + occasional dribbling. CKD status: stable Medications renally dose: yes Previous renal evaluation: no Pneumovax:  Up to Date Influenza Vaccine:  Up to Date  COPD Last lung screening was in November 2021. Continues on Spiriva.  Continues to smoke 1 PPD and is not interested in quitting.  On lung CT screening noted -- "stable 3.3 cm right adrenal nodule with density 13" - has been noted on imaging and stable since 2017.  Did see oncology for abnormal labs, saw 11/18/20 -- returns in June and if everything stable will follow-up with them as needed. HU, compatible with an adenoma. COPD status: stable Satisfied with current treatment?:  yes Oxygen use: no Dyspnea frequency: occasional, at baseline Cough frequency: occasional Rescue inhaler frequency:  none Limitation of activity: no Productive cough: none Last Spirometry: none recent Pneumovax: Up to Date Influenza: Up to Date  DEPRESSION Continues on Cymbalta, Buspar 20 MG TID, Vistaril.  Endorses increased panic attacks ongoing due to her new CPAP machine.   Mood status: stable Satisfied with current treatment?: yes Symptom severity: moderate  Duration of current treatment : chronic Side effects: no Medication compliance: good compliance Psychotherapy/counseling: none Depressed mood: sometimes Anxious mood: no Anhedonia: no Significant weight loss or gain: no Insomnia: yes hard to stay asleep Fatigue: no Feelings of worthlessness or guilt: no Impaired concentration/indecisiveness: no Suicidal ideations: no Hopelessness: no Crying spells: no Depression screen [facility] 2/9 03/04/2021 09/03/2020 03/29/2020 03/04/2020 04/05/2019  Decreased Interest 0 0 0 0 0  Down, Depressed, Hopeless 0 0 0 0 0  PHQ - 2 Score 0 0 0 0 0  Altered sleeping 0 0 1 1 0  Tired, decreased energy 0 0 0 0 0  Change in appetite 0 0 0 0 0  Feeling bad or failure about yourself  0 0 0 0 0  Trouble concentrating 0 0 0 0 0  Moving slowly or fidgety/restless 0 0 0 0 0  Suicidal thoughts 0 0 0 0 0  PHQ-9 Score 0 0 1 1 0  Difficult doing work/chores - Not difficult at all Not difficult at all Not difficult at all -  Some recent data might be  hidden   GERD Continues on Omeprazole and reports good control. GERD control status: stable  Satisfied with current treatment? yes Heartburn frequency: none Medication side effects: no  Medication compliance: stable Dysphagia: no Odynophagia:  no Hematemesis: no Blood in stool: no EGD: no  VITAMIN D DEFICIENCY: Continues on daily supplement without issues.  No recent falls or fractures.  Last level 39.3.  Relevant past medical, surgical, family and  social history reviewed and updated as indicated. Interim medical history since our last visit reviewed. Allergies and medications reviewed and updated.  Review of Systems  Constitutional: Negative for activity change, appetite change, diaphoresis, fatigue and fever.  Respiratory: Negative for cough, chest tightness and shortness of breath.   Cardiovascular: Negative for chest pain, palpitations and leg swelling.  Gastrointestinal: Negative for abdominal distention, abdominal pain, constipation, diarrhea, nausea and vomiting.  Endocrine: Negative for cold intolerance, heat intolerance, polydipsia, polyphagia and polyuria.  Neurological: Negative for dizziness, syncope, weakness, light-headedness, numbness and headaches.  Psychiatric/Behavioral: Negative.     Per HPI unless specifically indicated above     Objective:    BP 104/62   Pulse 96   Temp 98.8 F (37.1 C) (Oral)   Wt 255 lb 9.6 oz (115.9 kg)   LMP  (LMP Unknown)   SpO2 94%   BMI 45.28 kg/m   Wt Readings from Last 3 Encounters:  03/04/21 255 lb 9.6 oz (115.9 kg)  10/29/20 240 lb 12.8 oz (109.2 kg)  10/10/20 254 lb (115.2 kg)    Physical Exam Vitals and nursing note reviewed.  Constitutional:      General: She is awake. She is not in acute distress.    Appearance: She is well-developed. She is obese. She is not ill-appearing.  HENT:     Head: Normocephalic.     Right Ear: Hearing normal.     Left Ear: Hearing normal.  Eyes:     General: Lids are normal.        Right eye: No discharge.        Left eye: No discharge.     Conjunctiva/sclera: Conjunctivae normal.     Pupils: Pupils are equal, round, and reactive to light.  Neck:     Vascular: No carotid bruit.  Cardiovascular:     Rate and Rhythm: Normal rate and regular rhythm.     Heart sounds: Normal heart sounds. No murmur heard. No gallop.   Pulmonary:     Effort: Pulmonary effort is normal. No accessory muscle usage or respiratory distress.     Breath  sounds: Decreased breath sounds and wheezing present.     Comments: Diminished throughout per baseline with occasional expiratory wheezes. Abdominal:     General: Bowel sounds are normal.     Palpations: Abdomen is soft.  Musculoskeletal:     Cervical back: Normal range of motion and neck supple.     Right lower leg: No edema.     Left lower leg: No edema.  Skin:    General: Skin is warm and dry.  Neurological:     Mental Status: She is alert and oriented to person, place, and time.  Psychiatric:        Attention and Perception: Attention normal.        Mood and Affect: Mood normal.        Speech: Speech normal.        Behavior: Behavior normal. Behavior is cooperative.        Thought Content: Thought content normal.  Results for orders placed or performed in visit on 03/04/21  Microscopic Examination   Urine  Result Value Ref Range   WBC, UA 6-10 (A) 0 - 5 /hpf   RBC None seen 0 - 2 /hpf   Epithelial Cells (non renal) 0-10 0 - 10 /hpf   Bacteria, UA Few (A) None seen/Few  Urinalysis, Routine w reflex microscopic  Result Value Ref Range   Specific Gravity, UA 1.010 1.005 - 1.030   pH, UA 5.5 5.0 - 7.5   Color, UA Yellow Yellow   Appearance Ur Cloudy (A) Clear   Leukocytes,UA 2+ (A) Negative   Protein,UA Negative Negative/Trace   Glucose, UA Negative Negative   Ketones, UA Negative Negative   RBC, UA Negative Negative   Bilirubin, UA Negative Negative   Urobilinogen, Ur 0.2 0.2 - 1.0 mg/dL   Nitrite, UA Negative Negative   Microscopic Examination See below:       Assessment & Plan:   Problem List Items Addressed This Visit      Cardiovascular and Mediastinum   Benign hypertension    Chronic, ongoing.  BP below goal today.  Continue current medication regimen and adjust as needed.  Recommend she monitor BP at home daily and document for provider + focus on DASH diet.  Obtain CMP and TSH today.  Refills up to date.  Return to office in 6 months.      Relevant  Orders   Urinalysis, Routine w reflex microscopic (Completed)   Comprehensive metabolic panel   TSH   Aortic atherosclerosis (HCC)    Chronic, ongoing, noted on lung CT 10/02/2019.  Recommend she continue ASA and statin daily for prevention.  Recommend complete cessation of smoking.        Respiratory   Centrilobular emphysema (HCC) - Primary    Chronic, ongoing.  Continue current medication regimen and adjust as needed.  Not O2 dependent at this time.  Continue to recommend complete smoking cessation. Spirometry at next visit.  Consider referral to pulmonary if worsening.      Sleep apnea    Chronic, ongoing.   Her current CPAP setting is 20.  She uses her CPAP 100% of the time, using it every night for 8 hours a night.  She is very dedicated to her CPAP use, which benefits her overall health, including her chronic conditions such as COPD, HTN, obesity.  Continue 100% CPAP use.        Digestive   Gastroesophageal reflux disease    Chronic, stable with Omeprazole which offers her benefit.  Continue current medication regimen and check Mag level next visit.        Endocrine   IFG (impaired fasting glucose)    Check A1C today.        Genitourinary   Chronic kidney disease, stage 3a ([place])    GFR in the 50's last visit.  Recheck today.  Reminded to avoid NSAIDs.  Monitor function, may need to reduce Metformin in future.  Labs today. UA today noted 2+ leuk and few bacteria, if patient has return of any UTI symptoms to return to office for recheck.  Increase fluid intake at home.      Relevant Orders   Comprehensive metabolic panel   Microscopic Examination (Completed)     Other   Anxiety    Chronic, ongoing.  Continue current medication regimen and adjust as needed.  Buspar offering benefit -- recommend Melatonin at night for rest.      Depression, major,  single episode, in partial remission (HCC)    Chronic, ongoing.  Continue current medication regimen and adjust as needed.  Duloxetine offers benefit to pain and mood.  Refills sent as needed.  Recommend adding Melatonin.  Denies SI/HI.  Return in 6 months.      Hyperlipidemia    Chronic, ongoing.  Continue statin and adjust as needed.  Labs today.      Relevant Orders   Lipid Panel w/o Chol/HDL Ratio   Obesity    Recommend continued focus on health diet choices and regular physical activity (30 minutes 5 days a week).  Continue Metformin to assist with weight loss and renal dose as needed.       Nicotine dependence, cigarettes, uncomplicated    I have recommended complete cessation of tobacco use. I have discussed various options available for assistance with tobacco cessation including over the counter methods (Nicotine gum, patch and lozenges). We also discussed prescription options (Chantix, Nicotine Inhaler / Nasal Spray). The patient is not interested in pursuing any prescription tobacco cessation options at this time.  Continue annual lung screening.       Vitamin D deficiency    Continue supplement and check Vit D level today.      Relevant Orders   VITAMIN D 25 Hydroxy (Vit-D Deficiency, Fractures)   Monocytosis    Noted on past labs, continue collaboration with oncology at this time.      Mass of right adrenal gland ([place])    Noted on imaging, CT lung scans, since 2017, remaining stable in size at 3.3 cm.  Continue to monitor and consider referral if any changes.  CMP today.       Other Visit Diagnoses    B12 deficiency       Check level and continue supplement.   Relevant Orders   Vitamin B12   Pneumococcal vaccination given       PCV13 today.   Relevant Orders   Pneumococcal conjugate vaccine 13-valent (Completed)       Follow up plan: Return in about 6 months (around 09/03/2021) for HTN/HLD, PREDIABETES, CKD, MOOD, COPD.

## 2021-03-04 NOTE — Assessment & Plan Note (Signed)
Chronic, ongoing.  Continue current medication regimen and adjust as needed. Duloxetine offers benefit to pain and mood.  Refills sent as needed.  Recommend adding Melatonin.  Denies SI/HI.  Return in 6 months.

## 2021-03-04 NOTE — Assessment & Plan Note (Addendum)
GFR in the 50's last visit.  Recheck today.  Reminded to avoid NSAIDs.  Monitor function, may need to reduce Metformin in future.  Labs today. UA today noted 2+ leuk and few bacteria, if patient has return of any UTI symptoms to return to office for recheck.  Increase fluid intake at home.

## 2021-03-05 LAB — COMPREHENSIVE METABOLIC PANEL
ALT: 19 IU/L (ref 0–32)
AST: 24 IU/L (ref 0–40)
Albumin/Globulin Ratio: 1.3 (ref 1.2–2.2)
Albumin: 4 g/dL (ref 3.8–4.8)
Alkaline Phosphatase: 160 IU/L — ABNORMAL HIGH (ref 44–121)
BUN/Creatinine Ratio: 10 — ABNORMAL LOW (ref 12–28)
BUN: 12 mg/dL (ref 8–27)
Bilirubin Total: 0.2 mg/dL (ref 0.0–1.2)
CO2: 23 mmol/L (ref 20–29)
Calcium: 10 mg/dL (ref 8.7–10.3)
Chloride: 100 mmol/L (ref 96–106)
Creatinine, Ser: 1.18 mg/dL — ABNORMAL HIGH (ref 0.57–1.00)
Globulin, Total: 3 g/dL (ref 1.5–4.5)
Glucose: 114 mg/dL — ABNORMAL HIGH (ref 65–99)
Potassium: 4.8 mmol/L (ref 3.5–5.2)
Sodium: 139 mmol/L (ref 134–144)
Total Protein: 7 g/dL (ref 6.0–8.5)
eGFR: 51 mL/min/{1.73_m2} — ABNORMAL LOW (ref >59)

## 2021-03-05 LAB — TSH: TSH: 2.88 u[IU]/mL (ref 0.450–4.500)

## 2021-03-05 LAB — LIPID PANEL W/O CHOL/HDL RATIO
Cholesterol, Total: 179 mg/dL (ref 100–199)
HDL: 50 mg/dL (ref >39)
LDL Chol Calc (NIH): 103 mg/dL — ABNORMAL HIGH (ref 0–99)
Triglycerides: 146 mg/dL (ref 0–149)
VLDL Cholesterol Cal: 26 mg/dL (ref 5–40)

## 2021-03-05 LAB — VITAMIN B12: Vitamin B-12: 1594 pg/mL — ABNORMAL HIGH (ref 232–1245)

## 2021-03-05 LAB — VITAMIN D 25 HYDROXY (VIT D DEFICIENCY, FRACTURES): Vit D, 25-Hydroxy: 42.2 ng/mL (ref 30.0–100.0)

## 2021-03-05 NOTE — Progress Notes (Signed)
Contacted via [place] morning Ishani, your labs have returned: - Kidney function continues to show some mild kidney disease with eGFR 51 and creatinine 1.18.  Continue to ensure good water intake daily.  Do not need to reduce Metformin dose unless eGFR <45.   - Thyroid and Vitamin D normal - B12 level in good range - Cholesterol levels show mild elevation in LDL this check, ensure you are taking Atorvastatin daily and next visit would like to check this fasting.  Any questions? Keep being awesome!!  Thank you for allowing me to participate in your care. Kindest regards, Feliberto Stockley

## 2021-03-17 ENCOUNTER — Other Ambulatory Visit: Payer: Self-pay | Admitting: Nurse Practitioner

## 2021-03-17 ENCOUNTER — Other Ambulatory Visit: Payer: Self-pay

## 2021-03-17 MED ORDER — HYDROXYZINE HCL 10 MG PO TABS: 10.0000 mg | ORAL_TABLET | Freq: Three times a day (TID) | ORAL | 4 refills | Status: DC | PRN

## 2021-03-17 NOTE — Telephone Encounter (Signed)
Routing to provider  

## 2021-03-21 ENCOUNTER — Other Ambulatory Visit: Payer: Self-pay | Admitting: Nurse Practitioner

## 2021-03-21 ENCOUNTER — Other Ambulatory Visit: Payer: Self-pay

## 2021-03-21 MED ORDER — SPIRIVA RESPIMAT 2.5 MCG/ACT IN AERS: INHALATION_SPRAY | RESPIRATORY_TRACT | 4 refills | Status: DC

## 2021-03-29 DIAGNOSIS — G4733 Obstructive sleep apnea (adult) (pediatric): Secondary | ICD-10-CM | POA: Diagnosis not present

## 2021-04-07 ENCOUNTER — Telehealth: Payer: Self-pay | Admitting: Licensed Clinical Social Worker

## 2021-04-07 ENCOUNTER — Telehealth: Payer: Self-pay

## 2021-04-07 NOTE — Telephone Encounter (Signed)
    Clinical Social Work  Chronic Care Management   Phone Outreach    04/07/2021 Name: [name] MRN: [id number] DOB: 05/19/1956  [name] is a 65 y.o. year old female who is a primary care patient of Cannady, Barbaraann Faster, NP .   CCM LCSW reached out to patient today by phone to introduce self, assess needs and offer Care Management services and interventions.    Telephone outreach was unsuccessful  Plan:CCM LCSW will wait for return call. If no return call is received, Will route chart to Care Guide to Garner if patient would like to reschedule phone appointment   Review of patient status, including review of consultants reports, relevant laboratory and other test results, and collaboration with appropriate care team members and the patient's provider was performed as part of comprehensive patient evaluation and provision of care management services.    [name], [email or web address] Phone [phone] 4:54 PM

## 2021-04-09 ENCOUNTER — Telehealth: Payer: Self-pay

## 2021-04-09 NOTE — Chronic Care Management (AMB) (Signed)
  Care Management   Note  04/09/2021 Name: [name] MRN: [id number] DOB: Feb 10, 1956  [name] is a 65 y.o. year old female who is a primary care patient of Venita Lick, NP and is actively engaged with the care management team. I reached out to [name] by phone today to assist with re-scheduling a follow up visit with the Licensed Clinical Social Worker  Follow up plan: Unsuccessful telephone outreach attempt made. A HIPAA compliant phone message was left for the patient providing contact information and requesting a return call.  The care management team will reach out to the patient again over the next 7 days.  If patient returns call to provider office, please advise to call [place]  at [address], [place], [place], [place] [address] Direct Dial: [phone] [email or web address] Website: [email or web address]

## 2021-04-11 ENCOUNTER — Other Ambulatory Visit: Payer: Self-pay | Admitting: Nurse Practitioner

## 2021-04-11 NOTE — Telephone Encounter (Signed)
Scheduled 10/5

## 2021-04-11 NOTE — Telephone Encounter (Signed)
Requested medications are due for refill today yes  Requested medications are on the active medication list yes  Last refill 1/28  Last visit 02/2021  Future visit scheduled 08/2021  Notes to clinic Not Delegated.

## 2021-04-22 NOTE — Chronic Care Management (AMB) (Signed)
  Care Management   Note  04/22/2021 Name: [name] MRN: [id number] DOB: [birth year]  [name] is a 65 y.o. year old female who is a primary care patient of Venita Lick, NP and is actively engaged with the care management team. I reached out to [name] by phone today to assist with re-scheduling a follow up visit with the Licensed Clinical Social Worker  Follow up plan: Unsuccessful telephone outreach attempt made. A HIPAA compliant phone message was left for the patient providing contact information and requesting a return call.  The care management team will reach out to the patient again over the next 7 days.  If patient returns call to provider office, please advise to call [place]  at [place], [place], [place], [place] [address] Direct Dial: [phone] [email or web address] Website: [email or web address]

## 2021-04-28 DIAGNOSIS — G4733 Obstructive sleep apnea (adult) (pediatric): Secondary | ICD-10-CM | POA: Diagnosis not present

## 2021-04-29 ENCOUNTER — Telehealth: Payer: Self-pay | Admitting: *Deleted

## 2021-04-29 NOTE — Telephone Encounter (Signed)
That needs to go to [place]

## 2021-04-29 NOTE — Telephone Encounter (Signed)
Pt brought in form for HTA claim denial for [employer] Plase review and advise pt. Placed in [place] for review

## 2021-05-06 NOTE — Chronic Care Management (AMB) (Signed)
  Care Management   Note  05/06/2021 Name: [name] MRN: [id number] DOB: May 22, 1956  [name] is a 65 y.o. year old female who is a primary care patient of Venita Lick, NP and is actively engaged with the care management team. I reached out to [name] by phone today to assist with re-scheduling a follow up visit with the Licensed Clinical Social Worker  Follow up plan: Telephone appointment with care management team member scheduled for:05/30/2021  Noreene Larsson, [place], [place]  [place], [place] [address] Direct Dial: [phone] [email or web address] Website: [email or web address]

## 2021-05-06 NOTE — Telephone Encounter (Signed)
Patient has been rescheduled.

## 2021-05-07 NOTE — Telephone Encounter (Signed)
Per last note form was placed in bin to be reviewed

## 2021-05-07 NOTE — Telephone Encounter (Signed)
Have you guys seen this form

## 2021-05-19 ENCOUNTER — Inpatient Hospital Stay (HOSPITAL_BASED_OUTPATIENT_CLINIC_OR_DEPARTMENT_OTHER): Payer: PPO | Admitting: Oncology

## 2021-05-19 ENCOUNTER — Encounter: Payer: Self-pay | Admitting: Oncology

## 2021-05-19 ENCOUNTER — Inpatient Hospital Stay: Payer: PPO | Attending: Oncology

## 2021-05-19 VITALS — BP 133/57 | HR 81 | Temp 98.6°F | Resp 16 | Wt 251.0 lb

## 2021-05-19 DIAGNOSIS — J449 Chronic obstructive pulmonary disease, unspecified: Secondary | ICD-10-CM | POA: Insufficient documentation

## 2021-05-19 DIAGNOSIS — K219 Gastro-esophageal reflux disease without esophagitis: Secondary | ICD-10-CM | POA: Insufficient documentation

## 2021-05-19 DIAGNOSIS — F17210 Nicotine dependence, cigarettes, uncomplicated: Secondary | ICD-10-CM | POA: Insufficient documentation

## 2021-05-19 DIAGNOSIS — Z79899 Other long term (current) drug therapy: Secondary | ICD-10-CM | POA: Insufficient documentation

## 2021-05-19 DIAGNOSIS — E785 Hyperlipidemia, unspecified: Secondary | ICD-10-CM | POA: Diagnosis not present

## 2021-05-19 DIAGNOSIS — D72820 Lymphocytosis (symptomatic): Secondary | ICD-10-CM

## 2021-05-19 DIAGNOSIS — I10 Essential (primary) hypertension: Secondary | ICD-10-CM | POA: Insufficient documentation

## 2021-05-19 DIAGNOSIS — D72829 Elevated white blood cell count, unspecified: Secondary | ICD-10-CM | POA: Insufficient documentation

## 2021-05-19 DIAGNOSIS — D72821 Monocytosis (symptomatic): Secondary | ICD-10-CM

## 2021-05-19 DIAGNOSIS — E119 Type 2 diabetes mellitus without complications: Secondary | ICD-10-CM | POA: Diagnosis not present

## 2021-05-19 DIAGNOSIS — M810 Age-related osteoporosis without current pathological fracture: Secondary | ICD-10-CM | POA: Insufficient documentation

## 2021-05-19 DIAGNOSIS — Z7984 Long term (current) use of oral hypoglycemic drugs: Secondary | ICD-10-CM | POA: Insufficient documentation

## 2021-05-19 LAB — CBC WITH DIFFERENTIAL/PLATELET
Abs Immature Granulocytes: 0 10*3/uL (ref 0.00–0.07)
Basophils Absolute: 0.2 10*3/uL — ABNORMAL HIGH (ref 0.0–0.1)
Basophils Relative: 1 %
Eosinophils Absolute: 1.1 10*3/uL — ABNORMAL HIGH (ref 0.0–0.5)
Eosinophils Relative: 7 %
HCT: 38.1 % (ref 36.0–46.0)
Hemoglobin: 12.4 g/dL (ref 12.0–15.0)
Lymphocytes Relative: 45 %
Lymphs Abs: 6.9 10*3/uL — ABNORMAL HIGH (ref 0.7–4.0)
MCH: 28.4 pg (ref 26.0–34.0)
MCHC: 32.5 g/dL (ref 30.0–36.0)
MCV: 87.4 fL (ref 80.0–100.0)
Monocytes Absolute: 0.9 10*3/uL (ref 0.1–1.0)
Monocytes Relative: 6 %
Neutro Abs: 6.3 10*3/uL (ref 1.7–7.7)
Neutrophils Relative %: 41 %
Platelets: 348 10*3/uL (ref 150–400)
RBC: 4.36 MIL/uL (ref 3.87–5.11)
RDW: 15.3 % (ref 11.5–15.5)
Smear Review: NORMAL
WBC: 15.4 10*3/uL — ABNORMAL HIGH (ref 4.0–10.5)
nRBC: 0 % (ref 0.0–0.2)

## 2021-05-19 NOTE — Progress Notes (Signed)
Hematology/Oncology Consult note [facility]  [phone] [phone]  Patient Care Team: Venita Lick, NP as PCP - General (Nurse Practitioner) Greg Cutter, LCSW as Social Worker (Licensed Clinical Social Worker) Sindy Guadeloupe, MD as Consulting Physician (Oncology)   Name of the patient: [name]  [id number]  November 15, 1956   Date of visit: 05/19/21  Diagnosis-leukocytosis mainly neutrophilia and lymphocytosis likely reactive  Chief complaint/ Reason for visit-routine follow-up of leukocytosis  Heme/Onc history:  Patient is a 65 year old female with a past medical history significant for GERD, hypertension, COPD, sleep apnea referred for leukocytosis.  Looking back at her prior CBCs patient has had a mildly elevated white cell count that fluctuates between 11-16 over the last 6 years.  Differential is mainly showed neutrophilia and lymphocytosis as well as monocytosis.  Hemoglobin is remained stable between 12-13 and platelet counts have been normal.  Patient does have baseline shortness of breath and fatigue from her underlying COPD.  Appetite and weight have remained stable.  Denies any drenching night sweats  Results of blood work from 10/29/2020 were as follows: CBC showed white count of 12.7, H&H of 12.5/38.4 and a platelet count of 308.  Differential showed mild monocytosis with an absolute monocyte count of 1.2.  Flow cytometry was otherwise unremarkable and did not show any immunophenotypic abnormality.  Kappa lambda ratio was 3.1.  BCR ABL FISH testing was negative.    Interval history-patient has baseline exertional shortness of breath and fatigue which has remained unchanged.  Denies other complaints at this time.  ECOG PS- 2 Pain scale- 0   Review of systems- Review of Systems  Constitutional:  Positive for malaise/fatigue. Negative for chills, fever and weight loss.  HENT:  Negative for congestion, ear discharge  and nosebleeds.   Eyes:  Negative for blurred vision.  Respiratory:  Positive for shortness of breath. Negative for cough, hemoptysis, sputum production and wheezing.   Cardiovascular:  Negative for chest pain, palpitations, orthopnea and claudication.  Gastrointestinal:  Negative for abdominal pain, blood in stool, constipation, diarrhea, heartburn, melena, nausea and vomiting.  Genitourinary:  Negative for dysuria, flank pain, frequency, hematuria and urgency.  Musculoskeletal:  Negative for back pain, joint pain and myalgias.  Skin:  Negative for rash.  Neurological:  Negative for dizziness, tingling, focal weakness, seizures, weakness and headaches.  Endo/Heme/Allergies:  Does not bruise/bleed easily.  Psychiatric/Behavioral:  Negative for depression and suicidal ideas. The patient does not have insomnia.       No Known Allergies   Past Medical History:  Diagnosis Date   Arthritis    COPD (chronic obstructive pulmonary disease) (HCC)    Enlarged heart    GERD (gastroesophageal reflux disease)    Hiatal hernia    Hx of degenerative disc disease    Hyperlipidemia    Hypertension    Mass    On adrenal gland   Monocytosis    Osteoporosis    Sleep apnea      Past Surgical History:  Procedure Laterality Date   ABDOMINAL HYSTERECTOMY     ACHILLES TENDON REPAIR     Removed bone spur and repaired achilles tendon   APPENDECTOMY     bone fusion in neck     COLONOSCOPY WITH PROPOFOL N/A 01/17/2019   Procedure: COLONOSCOPY WITH PROPOFOL;  Surgeon: Jonathon Bellows, MD;  Location: [facility] ENDOSCOPY;  Service: Gastroenterology;  Laterality: N/A;   HERNIA REPAIR     has not had repair  TONSILLECTOMY      Social History   Socioeconomic History   Marital status: Widowed    Spouse name: Not on file   Number of children: Not on file   Years of education: Not on file   Highest education level: Not on file  Occupational History   Occupation: disabled  Tobacco Use   Smoking status:  Every Day    Packs/day: 1.00    Years: 48.00    Pack years: 48.00    Types: Cigarettes   Smokeless tobacco: Never   Tobacco comments:    pt states she plans to begin using patch to quit smoking  Vaping Use   Vaping Use: Never used  Substance and Sexual Activity   Alcohol use: No    Alcohol/week: 0.0 standard drinks   Drug use: No   Sexual activity: Never    Partners: Male  Other Topics Concern   Not on file  Social History Narrative   Not on file   Social Determinants of Health   Financial Resource Strain: Not on file  Food Insecurity: Not on file  Transportation Needs: Not on file  Physical Activity: Not on file  Stress: Not on file  Social Connections: Not on file  Intimate Partner Violence: Not on file    Family History  Problem Relation Age of Onset   COPD Father    Heart disease Father    Hyperlipidemia Father    Hypertension Father    Dementia Father    Cancer Sister    Hyperlipidemia Sister    Hypertension Sister    Lymphoma Sister    Migraines Son    Stroke Maternal Grandfather    Heart attack Brother    Cancer Brother        colorectal and liver   Breast cancer Paternal Aunt    Breast cancer Paternal Aunt      Current Outpatient Medications:    acetaminophen (TYLENOL) 500 MG tablet, Take 1,000 mg by mouth in the morning and at bedtime., Disp: , Rfl:    albuterol (PROAIR HFA) 108 (90 Base) MCG/ACT inhaler, Inhale 2 puffs into the lungs every 6 (six) hours as needed for wheezing or shortness of breath., Disp: 8.5 g, Rfl: 5   ascorbic acid (VITAMIN C) 500 MG tablet, Take 500 mg by mouth daily., Disp: , Rfl:    aspirin 81 MG tablet, Take 81 mg by mouth daily., Disp: , Rfl:    atorvastatin (LIPITOR) 40 MG tablet, Take 1 tablet (40 mg total) by mouth daily., Disp: 90 tablet, Rfl: 4   busPIRone (BUSPAR) 10 MG tablet, Take 2 tablets (20 mg total) by mouth 3 (three) times daily., Disp: 180 tablet, Rfl: 12   Calcium Citrate-Vitamin D (CALCIUM + D PO), Take  1,000 mg by mouth daily., Disp: , Rfl:    DULoxetine (CYMBALTA) 60 MG capsule, Take 1 capsule (60 mg total) by mouth daily., Disp: 90 capsule, Rfl: 4   furosemide (LASIX) 20 MG tablet, Take 1 tablet (20 mg total) by mouth daily., Disp: 90 tablet, Rfl: 4   gabapentin (NEURONTIN) 800 MG tablet, Take 1 tablet (800 mg total) by mouth 3 (three) times daily., Disp: 360 tablet, Rfl: 4   hydrOXYzine (ATARAX/VISTARIL) 10 MG tablet, Take 1 tablet (10 mg total) by mouth 3 (three) times daily as needed., Disp: 90 tablet, Rfl: 4   metFORMIN (GLUCOPHAGE) 500 MG tablet, Take 1 tablet (500 mg total) by mouth 3 (three) times daily with meals., Disp: 270 tablet,  Rfl: 4   Multiple Vitamins-Minerals (MULTIVITAMIN WITH MINERALS) tablet, Take 1 tablet by mouth daily., Disp: , Rfl:    omeprazole (PRILOSEC) 40 MG capsule, Take 1 capsule (40 mg total) by mouth daily., Disp: 90 capsule, Rfl: 3   potassium chloride SA (KLOR-CON) 20 MEQ tablet, Take 1 tablet by mouth once daily, Disp: 90 tablet, Rfl: 4   SPIRIVA RESPIMAT 2.5 MCG/ACT AERS, INHALE 2 SPRAY(S) BY MOUTH ONCE DAILY, Disp: 4 g, Rfl: 5   telmisartan-hydrochlorothiazide (MICARDIS HCT) 40-12.5 MG tablet, Take 1 tablet by mouth daily., Disp: 90 tablet, Rfl: 4   tiZANidine (ZANAFLEX) 4 MG capsule, TAKE 1 CAPSULE BY MOUTH THREE TIMES DAILY, Disp: 90 capsule, Rfl: 4   vitamin B-12 (CYANOCOBALAMIN) 1000 MCG tablet, Take 1,000 mcg by mouth daily., Disp: , Rfl:   Physical exam:  Vitals:   05/19/21 1033  BP: (!) 133/57  Pulse: 81  Resp: 16  Temp: 98.6 F (37 C)  TempSrc: Oral  Weight: 251 lb (113.9 kg)   Physical Exam Constitutional:      General: She is not in acute distress.    Comments: Patient is obese.  She is sitting in a wheelchair.  Appears in no acute distress  Cardiovascular:     Rate and Rhythm: Normal rate and regular rhythm.     Heart sounds: Normal heart sounds.  Pulmonary:     Effort: Pulmonary effort is normal.     Breath sounds: Normal breath  sounds.  Abdominal:     General: Bowel sounds are normal.     Palpations: Abdomen is soft.  Musculoskeletal:     Comments: Bilateral +1 edema  Skin:    General: Skin is warm and dry.  Neurological:     Mental Status: She is alert and oriented to person, place, and time.     CMP Latest Ref Rng & Units 03/04/2021  Glucose 65 - 99 mg/dL 114(H)  BUN 8 - 27 mg/dL 12  Creatinine 0.57 - 1.00 mg/dL 1.18(H)  Sodium 134 - 144 mmol/L 139  Potassium 3.5 - 5.2 mmol/L 4.8  Chloride 96 - 106 mmol/L 100  CO2 20 - 29 mmol/L 23  Calcium 8.7 - 10.3 mg/dL 10.0  Total Protein 6.0 - 8.5 g/dL 7.0  Total Bilirubin 0.0 - 1.2 mg/dL 0.2  Alkaline Phos 44 - 121 IU/L 160(H)  AST 0 - 40 IU/L 24  ALT 0 - 32 IU/L 19   CBC Latest Ref Rng & Units 05/19/2021  WBC 4.0 - 10.5 K/uL 15.4(H)  Hemoglobin 12.0 - 15.0 g/dL 12.4  Hematocrit 36.0 - 46.0 % 38.1  Platelets 150 - 400 K/uL 348    Assessment and plan- Patient is a 65 y.o. female referred for lymphocytosis likely reactive  Patient had a flow cytometry in November 2021 which did not reveal any monoclonal B-cell population.  She therefore has reactive lymphocytosis which can be seen sometimes with smoking as well.  Her white count fluctuates between 10-18 but has never really crossed 20.  It waxes and wanes.  Recommend continued follow-up with primary care doctor with repeat CBCs to be checked every 6 months.  There is a consistent increasing trend in her WBC count she can be referred to [place] in the future and we can potentially consider a bone marrow biopsy.  She does not have any other cytopenias at this time as well.   Visit Diagnosis 1. Lymphocytosis      Dr. Randa Evens, MD, MPH [place] [facility] [phone] 05/19/2021 4:17 PM

## 2021-05-19 NOTE — Progress Notes (Signed)
Pt having a lot of pain from arthritis and needs surgery. No concerns from her dx here.

## 2021-05-29 DIAGNOSIS — G4733 Obstructive sleep apnea (adult) (pediatric): Secondary | ICD-10-CM | POA: Diagnosis not present

## 2021-05-30 ENCOUNTER — Telehealth: Payer: PPO

## 2021-06-12 ENCOUNTER — Ambulatory Visit (INDEPENDENT_AMBULATORY_CARE_PROVIDER_SITE_OTHER): Payer: PPO | Admitting: Licensed Clinical Social Worker

## 2021-06-12 DIAGNOSIS — G894 Chronic pain syndrome: Secondary | ICD-10-CM

## 2021-06-12 DIAGNOSIS — F324 Major depressive disorder, single episode, in partial remission: Secondary | ICD-10-CM | POA: Diagnosis not present

## 2021-06-12 DIAGNOSIS — F419 Anxiety disorder, unspecified: Secondary | ICD-10-CM

## 2021-06-12 NOTE — Chronic Care Management (AMB) (Signed)
Chronic Care Management    Clinical Social Work Note  06/12/2021 Name: [name] MRN: [id number] DOB: 02/01/1956  HARRY BARK is a 65 y.o. year old female who is a primary care patient of Cannady, Barbaraann Faster, NP. The CCM team was consulted to assist the patient with chronic disease management and/or care coordination needs related to: Mental Health Counseling and Resources.   Engaged with patient by telephone for follow up visit in response to provider referral for social work chronic care management and care coordination services.   Consent to Services:  The patient was given information about Chronic Care Management services, agreed to services, and gave verbal consent prior to initiation of services.  Please see initial visit note for detailed documentation.   Patient agreed to services and consent obtained.   Assessment: Patient is engaged in conversation, continues to maintain positive progress with care plan goals. She reports management of depression and anxiety symptoms. Patient continues to participate in med management and utilizing healthy coping skills. See Care Plan below for interventions and patient self-care activities Recommendation: Patient may benefit from, and is in agreement to work with LCSW to address care coordination needs and will continue to work with the clinical team to address health care and disease management related needs.  Follow up Plan: Patient would like continued follow-up from CCM LCSW .  Follow up scheduled in 08/18/21. Patient will call office if needed prior to next encounter.  SDOH (Social Determinants of Health) assessments and interventions performed:    Advanced Directives Status: Not addressed in this encounter.  CCM Care Plan  No Known Allergies  Outpatient Encounter Medications as of 06/12/2021  Medication Sig   acetaminophen (TYLENOL) 500 MG tablet Take 1,000 mg by mouth in the morning and at bedtime.   albuterol (PROAIR HFA) 108  (90 Base) MCG/ACT inhaler Inhale 2 puffs into the lungs every 6 (six) hours as needed for wheezing or shortness of breath.   ascorbic acid (VITAMIN C) 500 MG tablet Take 500 mg by mouth daily.   aspirin 81 MG tablet Take 81 mg by mouth daily.   atorvastatin (LIPITOR) 40 MG tablet Take 1 tablet (40 mg total) by mouth daily.   busPIRone (BUSPAR) 10 MG tablet Take 2 tablets (20 mg total) by mouth 3 (three) times daily.   Calcium Citrate-Vitamin D (CALCIUM + D PO) Take 1,000 mg by mouth daily.   DULoxetine (CYMBALTA) 60 MG capsule Take 1 capsule (60 mg total) by mouth daily.   furosemide (LASIX) 20 MG tablet Take 1 tablet (20 mg total) by mouth daily.   gabapentin (NEURONTIN) 800 MG tablet Take 1 tablet (800 mg total) by mouth 3 (three) times daily.   hydrOXYzine (ATARAX/VISTARIL) 10 MG tablet Take 1 tablet (10 mg total) by mouth 3 (three) times daily as needed.   metFORMIN (GLUCOPHAGE) 500 MG tablet Take 1 tablet (500 mg total) by mouth 3 (three) times daily with meals.   Multiple Vitamins-Minerals (MULTIVITAMIN WITH MINERALS) tablet Take 1 tablet by mouth daily.   omeprazole (PRILOSEC) 40 MG capsule Take 1 capsule (40 mg total) by mouth daily.   potassium chloride SA (KLOR-CON) 20 MEQ tablet Take 1 tablet by mouth once daily   SPIRIVA RESPIMAT 2.5 MCG/ACT AERS INHALE 2 SPRAY(S) BY MOUTH ONCE DAILY   telmisartan-hydrochlorothiazide (MICARDIS HCT) 40-12.5 MG tablet Take 1 tablet by mouth daily.   tiZANidine (ZANAFLEX) 4 MG capsule TAKE 1 CAPSULE BY MOUTH THREE TIMES DAILY   vitamin B-12 (CYANOCOBALAMIN)  1000 MCG tablet Take 1,000 mcg by mouth daily.   No facility-administered encounter medications on file as of 06/12/2021.    Patient Active Problem List   Diagnosis Date Noted   Mass of right adrenal gland ([place]) 03/04/2021   Monocytosis 02/28/2021   Chronic kidney disease, stage 3a ([place]) 03/04/2020   Aortic atherosclerosis ([place]) 10/03/2019   IFG (impaired fasting glucose) 09/04/2019   Vitamin  D deficiency 12/29/2018   Osteoarthritis of right hip 12/27/2017   Advanced care planning/counseling discussion 06/07/2017   Benign hypertension 10/28/2015   Nicotine dependence, cigarettes, uncomplicated 83/38/2505   Facet syndrome, lumbar 07/11/2015   Spinal stenosis, lumbar region, with neurogenic claudication 07/11/2015   Status post cervical spinal fusion 07/11/2015   DJD (degenerative joint disease) of knee 07/11/2015   Chronic pain 05/17/2015   Anxiety 05/14/2015   Depression, major, single episode, in partial remission ([place]) 05/14/2015   DDD (degenerative disc disease), lumbar 05/14/2015   Centrilobular emphysema ([place]) 05/14/2015   Hyperlipidemia 05/14/2015   Gastroesophageal reflux disease 05/14/2015   Radiculopathy, lumbar region 05/14/2015   Sleep apnea 05/14/2015   Obesity 05/14/2015   Benign essential tremor 05/14/2015    Conditions to be addressed/monitored: Anxiety and Depression; Mental Health Concerns   Care Plan : General Social Work (Adult)  Updates made by Rebekah Chesterfield, LCSW since 06/12/2021 12:00 AM     Problem: Quality of Life (General Plan of Care)      Long-Range Goal: Quality of Life Maintained   Start Date: 02/17/2021  This Visit's Progress: On track  Priority: Medium  Note:   Timeframe:  Long-Range Goal Priority:  Medium Start Date:   02/17/21                       Expected End Date:  09/29/21                    Follow Up Date-08/18/21   Current Barriers:  Chronic Mental Health needs related to Panic Disorder and Depression Financial constraints related to managing health care expenses Limited social support ADL IADL limitations Mental Health Concerns  Social Isolation Inability to perform ADL's independently Inability to perform IADL's independently Suicidal Ideation/Homicidal Ideation: No  Clinical Social Work Goal(s):  Over the next 120 days, patient will work with SW bi-monthly by telephone or in person to reduce or manage  symptoms related to anxiety and depression. Over the next 120 days, patient will demonstrate improved health management independence as evidenced by implementing healthy self-care into her daily routine such as: attending all medical appointments, deep breathing exercsies, taking time for self-reflection, grounding exercises to combat panic attacks, taking medications as prescribed, drinking water and daily exercise to improve mobility.  Interventions: Patient interviewed and appropriate assessments performed Patient reports that her depression and anxiety symptoms are managed currently  Patient reports that the Melatonin has been assisting with improving sleep hygiene. She takes meds daily Patient participates in medication management. Denies any adverse side effects Patient enjoys spending time with family and playing games on computer. Her sister visits daily Patient is not interested in therapy Patient denies any resource needs Patient reports having a strong support system of family members. She shares that her son lives with her and he is disabled but he is able to assist her when needed. Patient's sister and brother also come daily to check on patient which provides patient ongoing socialization.  LCSW discussed coping skills for anxiety such a grounding exercises that  she can use when panic arises LCSW used empathetic and active and reflective listening, validated patient's feelings/concerns, and provided emotional support. LCSW provided self-care education to help manage her multiple health conditions and improve her mood.  Discussed plans with patient for ongoing care management follow up and provided patient with direct contact information for care management team Patient continues to have insomnia and goes to bed at 4-5 am. Patient reports that she has been this way since her spouse passed 3 years ago Mindfulness or Relaxation Training, Active listening / Reflection utilized , Emotional  Supportive Provided, and Verbalization of feelings encouraged  Emotional/Supportive Counseling provided during session Collaboration with PCP regarding development and update of comprehensive plan of care as evidenced by provider attestation and co-signature Inter-disciplinary care team collaboration (see longitudinal plan of care)  Patient Self Care Activities:  Call provider office for new concerns or questions Attend all scheduled appointments with providers Continue compliance with medication management Continue utilizing healthy coping skills to assist with management of symptoms          Christa See, MSW, [email or web address] Phone [phone] 5:18 PM

## 2021-06-12 NOTE — Patient Instructions (Signed)
Visit Information   Goals Addressed               This Visit's Progress     Patient Stated     SW "I have panic attack sometimes." (pt-stated)   On track     Timeframe:  Long-Range Goal Priority:  Medium Start Date:   02/17/21                       Expected End Date:  09/29/21                    Follow Up Date-08/18/21  Patient Self Care Activities:  Call provider office for new concerns or questions Attend all scheduled appointments with providers Continue compliance with medication management Continue utilizing healthy coping skills to assist with management of symptoms      SW-Track and Manage My Symptoms (pt-stated)   On track     Follow Up Date  within 90 days since 09/04/20   - avoid negative self-talk - develop a personal safety plan - develop a plan to deal with triggers like holidays, anniversaries - exercise at least 2 to 3 times per week - have a plan for how to handle bad days - journal feelings and what helps to feel better or worse - spend time or talk with others at least 2 to 3 times per week - spend time or talk with others every day - watch for early signs of feeling worse - write in journal every day   Patient reports that she was approved for [facility]. Patient reports that this helps somewhat with her insurance premiums. Patient reports that her disabled son resides with her which gives her some socialization and assistance when needed. She shares that her sister comes by daily to check on her.   Patient reports ongoing difficulty with sleep. LCSW provided education on healthy sleep hygiene and what that looks like. LCSW encouraged patient to implement a night time routine into her schedule that works best for her and that she is able to maintain. Patient reports that they are clearing out the woods in her backyard to create new homes which causes constant noise. Patient's PCP increased her Buspar medication which she hopes will help with  her sleep. Patient reports that she is actively trying to improve her sleep by using the fan and taking several deep breaths. Advised patient to continue implementing deep breathing/grounding/meditation/self-care exercises into her nightly routine to combat racing thoughts at night.    Why is this important?   Keeping track of your progress will help your treatment team find the right mix of medicine and therapy for you.  Write in your journal every day.  Day-to-day changes in depression symptoms are normal. It may be more helpful to check your progress at the end of each week instead of every day.     Notes:       Other     SW-Cope with Chronic Pain   On track     Follow Up Date-08/18/21  Patient Self Care Activities:  Call provider office for new concerns or questions Attend all scheduled appointments with providers Continue compliance with medication management Continue utilizing healthy coping skills to assist with management of symptoms        Patient verbalizes understanding of instructions provided today and agrees to view in [place].   Telephone follow up appointment with care management team member scheduled for:08/18/21  Christa See,  MSW, [email or web address] Phone [phone] 5:19 PM

## 2021-06-28 DIAGNOSIS — G4733 Obstructive sleep apnea (adult) (pediatric): Secondary | ICD-10-CM | POA: Diagnosis not present

## 2021-07-29 DIAGNOSIS — G4733 Obstructive sleep apnea (adult) (pediatric): Secondary | ICD-10-CM | POA: Diagnosis not present

## 2021-08-06 ENCOUNTER — Other Ambulatory Visit: Payer: Self-pay | Admitting: Nurse Practitioner

## 2021-08-07 NOTE — Telephone Encounter (Signed)
Requested Prescriptions  Pending Prescriptions Disp Refills  . hydrOXYzine (ATARAX/VISTARIL) 10 MG tablet [Pharmacy Med Name: hydrOXYzine HCl 10 MG Oral Tablet] 90 tablet 0    Sig: Take 1 tablet by mouth three times daily as needed     Ear, Nose, and Throat:  Antihistamines Passed - 08/06/2021  8:18 PM      Passed - Valid encounter within last 12 months    Recent Outpatient Visits          5 months ago Centrilobular emphysema ([place])   [place] Cannady, Jolene T, NP   11 months ago Centrilobular emphysema ([place])   [place], [place] T, NP   1 year ago Obstructive sleep apnea syndrome   [place], [place] T, NP   1 year ago Benign hypertension   [facility] [name], NP   1 year ago Chronic pain syndrome   [place], Barbaraann Faster, NP      Future Appointments            In 3 weeks Cannady, Barbaraann Faster, NP [employer], [facility]

## 2021-08-18 ENCOUNTER — Ambulatory Visit (INDEPENDENT_AMBULATORY_CARE_PROVIDER_SITE_OTHER): Payer: PPO | Admitting: Licensed Clinical Social Worker

## 2021-08-18 DIAGNOSIS — E782 Mixed hyperlipidemia: Secondary | ICD-10-CM

## 2021-08-18 DIAGNOSIS — F324 Major depressive disorder, single episode, in partial remission: Secondary | ICD-10-CM

## 2021-08-18 DIAGNOSIS — M5136 Other intervertebral disc degeneration, lumbar region: Secondary | ICD-10-CM

## 2021-08-18 DIAGNOSIS — G894 Chronic pain syndrome: Secondary | ICD-10-CM

## 2021-08-18 DIAGNOSIS — F419 Anxiety disorder, unspecified: Secondary | ICD-10-CM

## 2021-08-20 NOTE — Patient Instructions (Signed)
Visit Information   Goals Addressed               This Visit's Progress     Patient Stated     SW-Track and Manage My Symptoms (pt-stated)   On track       - avoid negative self-talk - develop a personal safety plan - develop a plan to deal with triggers like holidays, anniversaries - exercise at least 2 to 3 times per week - have a plan for how to handle bad days - journal feelings and what helps to feel better or worse - spend time or talk with others at least 2 to 3 times per week - spend time or talk with others every day - watch for early signs of feeling worse - write in journal every day   Patient reports that she was approved for [facility]. Patient reports that this helps somewhat with her insurance premiums. Patient reports that her disabled son resides with her which gives her some socialization and assistance when needed. She shares that her sister comes by daily to check on her.   Patient reports ongoing difficulty with sleep. LCSW provided education on healthy sleep hygiene and what that looks like. LCSW encouraged patient to implement a night time routine into her schedule that works best for her and that she is able to maintain. Patient reports that they are clearing out the woods in her backyard to create new homes which causes constant noise. Patient's PCP increased her Buspar medication which she hopes will help with her sleep. Patient reports that she is actively trying to improve her sleep by using the fan and taking several deep breaths. Advised patient to continue implementing deep breathing/grounding/meditation/self-care exercises into her nightly routine to combat racing thoughts at night.    Why is this important?   Keeping track of your progress will help your treatment team find the right mix of medicine and therapy for you.  Write in your journal every day.  Day-to-day changes in depression symptoms are normal. It may be more helpful to check  your progress at the end of each week instead of every day.     Notes:       Other     SW-Cope with Chronic Pain   On track     Follow Up Date-08/18/21  Patient Self Care Activities:  Call provider office for new concerns or questions Attend all scheduled appointments with providers Continue compliance with medication management Continue utilizing healthy coping skills to assist with management of symptoms        Patient verbalizes understanding of instructions provided today and agrees to view in [place].   Telephone follow up appointment with care management team member scheduled for:10/17/21  Christa See, MSW, [email or web address] Phone [phone] 8:39 AM

## 2021-08-20 NOTE — Chronic Care Management (AMB) (Signed)
Chronic Care Management    Clinical Social Work Note  08/20/2021 Name: [name] MRN: [id number] DOB: Dec 28, 1955  [name] is a 65 y.o. year old female who is a primary care patient of Cannady, Barbaraann Faster, NP. The CCM team was consulted to assist the patient with chronic disease management and/or care coordination needs related to: Mental Health Counseling and Resources.   Engaged with patient by telephone for follow up visit in response to provider referral for social work chronic care management and care coordination services.   Consent to Services:  The patient was given information about Chronic Care Management services, agreed to services, and gave verbal consent prior to initiation of services.  Please see initial visit note for detailed documentation.   Patient agreed to services and consent obtained.   Consent to Services:  The patient was given information about Care Management services, agreed to services, and gave verbal consent prior to initiation of services.  Please see initial visit note for detailed documentation.   Patient agreed to services today and consent obtained.   Assessment: Engaged with patient by phone in response to provider referral for social work care coordination services: [place] [facility] Resources.    Patient continues to maintain positive progress with care plan goals. She reports pain from left ear that radiates down to jaw. Patient plans on addressing concerns at upcoming appointment with PCP on 09/03/21. Strategies to assist with pain management and grief discussed. See Care Plan below for interventions and patient self-care activities.  Recent life changes or stressors: Pain management  Recommendation: Patient may benefit from, and is in agreement work with LCSW to address care coordination needs and will continue to work with the clinical team to address health care and disease management related needs.   Follow up Plan:  Patient would like continued follow-up from CCM LCSW .  per patient's request will follow up in 10/17/21.  Will call office if needed prior to next encounter.    SDOH (Social Determinants of Health) assessments and interventions performed:  NA  Advanced Directives Status: Not addressed in this encounter.  CCM Care Plan  No Known Allergies  Outpatient Encounter Medications as of 08/18/2021  Medication Sig   acetaminophen (TYLENOL) 500 MG tablet Take 1,000 mg by mouth in the morning and at bedtime.   albuterol (PROAIR HFA) 108 (90 Base) MCG/ACT inhaler Inhale 2 puffs into the lungs every 6 (six) hours as needed for wheezing or shortness of breath.   ascorbic acid (VITAMIN C) 500 MG tablet Take 500 mg by mouth daily.   aspirin 81 MG tablet Take 81 mg by mouth daily.   atorvastatin (LIPITOR) 40 MG tablet Take 1 tablet (40 mg total) by mouth daily.   busPIRone (BUSPAR) 10 MG tablet Take 2 tablets (20 mg total) by mouth 3 (three) times daily.   Calcium Citrate-Vitamin D (CALCIUM + D PO) Take 1,000 mg by mouth daily.   DULoxetine (CYMBALTA) 60 MG capsule Take 1 capsule (60 mg total) by mouth daily.   furosemide (LASIX) 20 MG tablet Take 1 tablet (20 mg total) by mouth daily.   gabapentin (NEURONTIN) 800 MG tablet Take 1 tablet (800 mg total) by mouth 3 (three) times daily.   hydrOXYzine (ATARAX/VISTARIL) 10 MG tablet Take 1 tablet by mouth three times daily as needed   metFORMIN (GLUCOPHAGE) 500 MG tablet Take 1 tablet (500 mg total) by mouth 3 (three) times daily with meals.   Multiple Vitamins-Minerals (MULTIVITAMIN WITH  MINERALS) tablet Take 1 tablet by mouth daily.   omeprazole (PRILOSEC) 40 MG capsule Take 1 capsule (40 mg total) by mouth daily.   potassium chloride SA (KLOR-CON) 20 MEQ tablet Take 1 tablet by mouth once daily   SPIRIVA RESPIMAT 2.5 MCG/ACT AERS INHALE 2 SPRAY(S) BY MOUTH ONCE DAILY   telmisartan-hydrochlorothiazide (MICARDIS HCT) 40-12.5 MG tablet Take 1 tablet by mouth  daily.   tiZANidine (ZANAFLEX) 4 MG capsule TAKE 1 CAPSULE BY MOUTH THREE TIMES DAILY   vitamin B-12 (CYANOCOBALAMIN) 1000 MCG tablet Take 1,000 mcg by mouth daily.   No facility-administered encounter medications on file as of 08/18/2021.    Patient Active Problem List   Diagnosis Date Noted   Mass of right adrenal gland ([place]) 03/04/2021   Monocytosis 02/28/2021   Chronic kidney disease, stage 3a ([place]) 03/04/2020   Aortic atherosclerosis ([place]) 10/03/2019   IFG (impaired fasting glucose) 09/04/2019   Vitamin D deficiency 12/29/2018   Osteoarthritis of right hip 12/27/2017   Advanced care planning/counseling discussion 06/07/2017   Benign hypertension 10/28/2015   Nicotine dependence, cigarettes, uncomplicated 40/98/1191   Facet syndrome, lumbar 07/11/2015   Spinal stenosis, lumbar region, with neurogenic claudication 07/11/2015   Status post cervical spinal fusion 07/11/2015   DJD (degenerative joint disease) of knee 07/11/2015   Chronic pain 05/17/2015   Anxiety 05/14/2015   Depression, major, single episode, in partial remission ([place]) 05/14/2015   DDD (degenerative disc disease), lumbar 05/14/2015   Centrilobular emphysema ([place]) 05/14/2015   Hyperlipidemia 05/14/2015   Gastroesophageal reflux disease 05/14/2015   Radiculopathy, lumbar region 05/14/2015   Sleep apnea 05/14/2015   Obesity 05/14/2015   Benign essential tremor 05/14/2015    Conditions to be addressed/monitored: Anxiety and Depression; Mental Health Concerns   Care Plan : General Social Work (Adult)  Updates made by Rebekah Chesterfield, LCSW since 08/20/2021 12:00 AM     Problem: Quality of Life (General Plan of Care)      Long-Range Goal: Quality of Life Maintained   Start Date: 02/17/2021  This Visit's Progress: On track  Recent Progress: On track  Priority: Medium  Note:   Timeframe:  Long-Range Goal Priority:  Medium Start Date:   02/17/21                       Expected End Date:  11/29/21                    Follow Up Date-10/17/21  Current Barriers:  Chronic Mental Health needs related to Panic Disorder and Depression Financial constraints related to managing health care expenses Limited social support ADL IADL limitations Mental Health Concerns  Social Isolation Inability to perform ADL's independently Inability to perform IADL's independently Suicidal Ideation/Homicidal Ideation: No Clinical Social Work Goal(s):  Over the next 120 days, patient will work with SW bi-monthly by telephone or in person to reduce or manage symptoms related to anxiety and depression. Over the next 120 days, patient will demonstrate improved health management independence as evidenced by implementing healthy self-care into her daily routine such as: attending all medical appointments, deep breathing exercsies, taking time for self-reflection, grounding exercises to combat panic attacks, taking medications as prescribed, drinking water and daily exercise to improve mobility. Interventions: Patient interviewed and appropriate assessments performed Patient reports that her depression and anxiety symptoms are managed currently  Patient reports, I'm having trouble with my left ear. The pain goes down to my jaw. It's been like this for several  weeks and I'm in constant pain" Patient denies any trauma to the ear. Has difficulty sleeping because she sleeps on her left side. Patient takes Tylenol that assists with arthritis in hands only CCM LCSW discussed strategies to assist with pain management. Patient identified the use of electric blankets, gaming, and spending time with others helpful to promote mood and cope with stressors/pain Patient utilizes a walker to promote safety Patient reports that the Melatonin has continued to assist with sleep hygiene. She takes meds daily Patient participates in medication management. Denies any adverse side effects Patient enjoys spending time with family and playing games on  computer. Her sister visits daily CCM LCSW discussed strategies to assist with grief with the upcoming change in season/holidays, etc Patient was encouraged to establish a plan if triggered due to loss of spouse Patient is not interested in therapy Patient denies any resource needs Patient reports having a strong support system of family members. She shares that her son lives with her and he is disabled but he is able to assist her when needed. Patient's sister and brother also come daily to check on patient which provides patient ongoing socialization.  LCSW discussed coping skills for anxiety such a grounding exercises that she can use when panic arises LCSW used empathetic and active and reflective listening, validated patient's feelings/concerns, and provided emotional support. LCSW provided self-care education to help manage her multiple health conditions and improve her mood.  Discussed plans with patient for ongoing care management follow up and provided patient with direct contact information for care management team Patient continues to have insomnia and goes to bed at 4-5 am. Patient reports that she has been this way since her spouse passed 3 years ago Mindfulness or Relaxation Training, Active listening / Reflection utilized , Emotional Supportive Provided, and Verbalization of feelings encouraged  Emotional/Supportive Counseling provided during session Collaboration with PCP regarding development and update of comprehensive plan of care as evidenced by provider attestation and co-signature Inter-disciplinary care team collaboration (see longitudinal plan of care) Patient Self Care Activities:  Call provider office for new concerns or questions Attend all scheduled appointments with providers Continue compliance with medication management Continue utilizing healthy coping skills to assist with management of symptoms      Christa See, MSW, [email or web address] Phone [phone] 8:36 AM

## 2021-08-28 ENCOUNTER — Encounter: Payer: Self-pay | Admitting: Nurse Practitioner

## 2021-08-29 DIAGNOSIS — E782 Mixed hyperlipidemia: Secondary | ICD-10-CM

## 2021-08-29 DIAGNOSIS — G4733 Obstructive sleep apnea (adult) (pediatric): Secondary | ICD-10-CM | POA: Diagnosis not present

## 2021-08-29 DIAGNOSIS — F324 Major depressive disorder, single episode, in partial remission: Secondary | ICD-10-CM

## 2021-09-03 ENCOUNTER — Encounter: Payer: Self-pay | Admitting: Nurse Practitioner

## 2021-09-03 ENCOUNTER — Ambulatory Visit (INDEPENDENT_AMBULATORY_CARE_PROVIDER_SITE_OTHER): Payer: PPO | Admitting: Nurse Practitioner

## 2021-09-03 ENCOUNTER — Other Ambulatory Visit: Payer: Self-pay

## 2021-09-03 VITALS — BP 97/58 | HR 85 | Temp 98.4°F | Wt 265.4 lb

## 2021-09-03 DIAGNOSIS — F324 Major depressive disorder, single episode, in partial remission: Secondary | ICD-10-CM | POA: Diagnosis not present

## 2021-09-03 DIAGNOSIS — E782 Mixed hyperlipidemia: Secondary | ICD-10-CM | POA: Diagnosis not present

## 2021-09-03 DIAGNOSIS — E278 Other specified disorders of adrenal gland: Secondary | ICD-10-CM

## 2021-09-03 DIAGNOSIS — F419 Anxiety disorder, unspecified: Secondary | ICD-10-CM

## 2021-09-03 DIAGNOSIS — N1831 Chronic kidney disease, stage 3a: Secondary | ICD-10-CM

## 2021-09-03 DIAGNOSIS — I10 Essential (primary) hypertension: Secondary | ICD-10-CM | POA: Diagnosis not present

## 2021-09-03 DIAGNOSIS — R7301 Impaired fasting glucose: Secondary | ICD-10-CM

## 2021-09-03 DIAGNOSIS — J432 Centrilobular emphysema: Secondary | ICD-10-CM | POA: Diagnosis not present

## 2021-09-03 DIAGNOSIS — G4733 Obstructive sleep apnea (adult) (pediatric): Secondary | ICD-10-CM

## 2021-09-03 DIAGNOSIS — D72821 Monocytosis (symptomatic): Secondary | ICD-10-CM

## 2021-09-03 DIAGNOSIS — F17210 Nicotine dependence, cigarettes, uncomplicated: Secondary | ICD-10-CM

## 2021-09-03 DIAGNOSIS — K2100 Gastro-esophageal reflux disease with esophagitis, without bleeding: Secondary | ICD-10-CM

## 2021-09-03 DIAGNOSIS — Z23 Encounter for immunization: Secondary | ICD-10-CM | POA: Diagnosis not present

## 2021-09-03 DIAGNOSIS — G894 Chronic pain syndrome: Secondary | ICD-10-CM

## 2021-09-03 LAB — BAYER DCA HB A1C WAIVED: HB A1C (BAYER DCA - WAIVED): 5.5 % (ref 4.8–5.6)

## 2021-09-03 MED ORDER — TIZANIDINE HCL 4 MG PO CAPS: 4.0000 mg | ORAL_CAPSULE | Freq: Three times a day (TID) | ORAL | 4 refills | Status: DC

## 2021-09-03 MED ORDER — BUSPIRONE HCL 10 MG PO TABS: 20.0000 mg | ORAL_TABLET | Freq: Three times a day (TID) | ORAL | 12 refills | Status: DC

## 2021-09-03 MED ORDER — FUROSEMIDE 20 MG PO TABS: 20.0000 mg | ORAL_TABLET | Freq: Every day | ORAL | 4 refills | Status: DC

## 2021-09-03 MED ORDER — POTASSIUM CHLORIDE CRYS ER 20 MEQ PO TBCR: 20.0000 meq | EXTENDED_RELEASE_TABLET | Freq: Every day | ORAL | 4 refills | Status: DC

## 2021-09-03 MED ORDER — DULOXETINE HCL 60 MG PO CPEP: 60.0000 mg | ORAL_CAPSULE | Freq: Every day | ORAL | 4 refills | Status: DC

## 2021-09-03 MED ORDER — TELMISARTAN-HCTZ 40-12.5 MG PO TABS: 1.0000 | ORAL_TABLET | Freq: Every day | ORAL | 4 refills | Status: DC

## 2021-09-03 MED ORDER — OMEPRAZOLE 40 MG PO CPDR: 40.0000 mg | DELAYED_RELEASE_CAPSULE | Freq: Every day | ORAL | 4 refills | Status: DC

## 2021-09-03 MED ORDER — ATORVASTATIN CALCIUM 40 MG PO TABS: 40.0000 mg | ORAL_TABLET | Freq: Every day | ORAL | 4 refills | Status: DC

## 2021-09-03 MED ORDER — OZEMPIC (0.25 OR 0.5 MG/DOSE) 2 MG/1.5ML ~~LOC~~ SOPN: PEN_INJECTOR | SUBCUTANEOUS | 12 refills | Status: DC

## 2021-09-03 MED ORDER — GABAPENTIN 800 MG PO TABS: 800.0000 mg | ORAL_TABLET | Freq: Three times a day (TID) | ORAL | 4 refills | Status: DC

## 2021-09-03 NOTE — Assessment & Plan Note (Signed)
Chronic, ongoing.  Continue current medication regimen and adjust as needed. Duloxetine offers benefit to pain and mood.  Refills sent as needed.  Recommend adding Melatonin.  Denies SI/HI.  Return in 6 months.

## 2021-09-03 NOTE — Assessment & Plan Note (Signed)
I have recommended complete cessation of tobacco use. I have discussed various options available for assistance with tobacco cessation including over the counter methods (Nicotine gum, patch and lozenges). We also discussed prescription options (Chantix, Nicotine Inhaler / Nasal Spray). The patient is not interested in pursuing any prescription tobacco cessation options at this time.  Continue annual lung screening. 

## 2021-09-03 NOTE — Assessment & Plan Note (Signed)
Ongoing with A1c ranging from 5.7 to 5.9%, today is improved at 5.5%.  At this time will stop Metformin as starting to see some kidney changes and has not benefited weight loss.  Will start Ozempic 0.25 MG weekly with plan to increase to 0.5 MG weekly in 4 weeks.  Educated her on this.  No history of thyroid cancer in patient or her family.  Provided sample in office today and educated her on medication + how to administer.  Plan for return in 4 weeks for weight check.

## 2021-09-03 NOTE — Assessment & Plan Note (Signed)
Was followed by oncology, now to return as needed if any consistent elevations noted.  To check CBC Q6MOS -- will recheck today and then every 6 months.

## 2021-09-03 NOTE — Progress Notes (Signed)
BP (!) 97/58   Pulse 85   Temp 98.4 F (36.9 C) (Oral)   Wt 265 lb 6.4 oz (120.4 kg)   LMP  (LMP Unknown)   SpO2 95%   BMI 47.01 kg/m    Subjective:    Patient ID: [name], female    DOB: 28-Nov-1956, 65 y.o.   MRN: [id number]  HPI: [name] is a 65 y.o. female  Chief Complaint  Patient presents with   Hyperlipidemia   Hypertension    Patient states everything is going well and denies having any questions or concerns at today's visit.    Prediabetes   Chronic Kidney Disease   Mood   COPD   EAR PAIN (LEFT) For 3 weeks has been present no worsening.  Hurts when she chews food.   Duration: weeks Involved ear(s): left Severity:  2/10  Quality:  dull and aching Fever: no Otorrhea: no Upper respiratory infection symptoms: no Pruritus: no Hearing loss: no Water immersion no Using Q-tips: yes Recurrent otitis media: no Status: stable Treatments attempted: none   HYPERTENSION / HYPERLIPIDEMIA Continues on Telmisartan-HCTZ 40-12.5, Lasix, K+, and Lipitor. Has been taking Metformin for 2 years to assist with weight loss and prediabetes, A1C over past years 5.7 to 5.9 -- last A1C 5.9% -- she is interested in changing to Ozempic to have more success with sugars and weight loss, as has had minimal benefit from Metformin.  For ventral hernia repair she has been told in past she needs to lose weight.  She currently has CPAP and uses this nightly. Satisfied with current treatment? yes Duration of hypertension: chronic BP monitoring frequency: not checking BP range:  BP medication side effects: no Duration of hyperlipidemia: chronic Cholesterol medication side effects: no Cholesterol supplements: none Medication compliance: good compliance Aspirin: yes Recent stressors: no Recurrent headaches: no Visual changes: no Palpitations: no Dyspnea: no Chest pain: no Lower extremity edema: no Dizzy/lightheaded: no   CHRONIC KIDNEY DISEASE Noted on recent labs  = eGFR 51. CKD status: stable Medications renally dose: yes Previous renal evaluation: no Pneumovax:  Up to Date Influenza Vaccine:  Up to Date  COPD Last lung screening was in November 2021. Continues on Spiriva.  Continues to smoke 1 PPD and is not interested in quitting.    On lung CT screening noted -- "stable 3.3 cm right adrenal nodule with density 13" - has been noted on imaging and stable since 2017.  Did see oncology for abnormal labs, saw last 05/19/21 -- stable will follow-up with them as needed and to check CBC Q6MOS if consistent increase in WBC then can refer back for possible biopsy. COPD status: stable Satisfied with current treatment?: yes Oxygen use: no Dyspnea frequency: occasional, at baseline Cough frequency: occasional Rescue inhaler frequency:  none Limitation of activity: no Productive cough: none Last Spirometry: none recent Pneumovax: Up to Date Influenza: Up to Date  DEPRESSION Continues on Cymbalta, Buspar 20 MG TID, Vistaril.    Takes Cymbalta for chronic back pain, lower back.  Has seen pain management in past at [facility], but did not have success per her report.  Also takes Tizanidine for pain.   Mood status: stable Satisfied with current treatment?: yes Symptom severity: moderate  Duration of current treatment : chronic Side effects: no Medication compliance: good compliance Psychotherapy/counseling: none Depressed mood: sometimes Anxious mood: no Anhedonia: no Significant weight loss or gain: no Insomnia: yes hard to stay asleep Fatigue: no Feelings of worthlessness or guilt: no  Impaired concentration/indecisiveness: no Suicidal ideations: no Hopelessness: no Crying spells: no Depression screen [facility] 2/9 09/03/2021 03/04/2021 09/03/2020 03/29/2020 03/04/2020  Decreased Interest 0 0 0 0 0  Down, Depressed, Hopeless 0 0 0 0 0  PHQ - 2 Score 0 0 0 0 0  Altered sleeping 0 0 0 1 1  Tired, decreased energy 0 0 0 0 0  Change in appetite 0 0 0 0 0   Feeling bad or failure about yourself  0 0 0 0 0  Trouble concentrating 0 0 0 0 0  Moving slowly or fidgety/restless 0 0 0 0 0  Suicidal thoughts 0 0 0 0 0  PHQ-9 Score 0 0 0 1 1  Difficult doing work/chores Not difficult at all - Not difficult at all Not difficult at all Not difficult at all  Some recent data might be hidden   GERD Continues on Omeprazole and reports good control. GERD control status: stable  Satisfied with current treatment? yes Heartburn frequency: none Medication side effects: no  Medication compliance: stable Dysphagia: no Odynophagia:  no Hematemesis: no Blood in stool: no EGD: no  VITAMIN D DEFICIENCY: Continues on daily supplement without issues.  No recent falls or fractures.  Last level 39.3.  Relevant past medical, surgical, family and social history reviewed and updated as indicated. Interim medical history since our last visit reviewed. Allergies and medications reviewed and updated.  Review of Systems  Constitutional:  Negative for activity change, appetite change, diaphoresis, fatigue and fever.  Respiratory:  Negative for cough, chest tightness and shortness of breath.   Cardiovascular:  Negative for chest pain, palpitations and leg swelling.  Gastrointestinal:  Negative for abdominal distention, abdominal pain, constipation, diarrhea, nausea and vomiting.  Endocrine: Negative for cold intolerance, heat intolerance, polydipsia, polyphagia and polyuria.  Neurological:  Negative for dizziness, syncope, weakness, light-headedness, numbness and headaches.  Psychiatric/Behavioral: Negative.     Per HPI unless specifically indicated above     Objective:    BP (!) 97/58   Pulse 85   Temp 98.4 F (36.9 C) (Oral)   Wt 265 lb 6.4 oz (120.4 kg)   LMP  (LMP Unknown)   SpO2 95%   BMI 47.01 kg/m   Wt Readings from Last 3 Encounters:  09/03/21 265 lb 6.4 oz (120.4 kg)  05/19/21 251 lb (113.9 kg)  03/04/21 255 lb 9.6 oz (115.9 kg)    Physical  Exam Vitals and nursing note reviewed.  Constitutional:      General: She is awake. She is not in acute distress.    Appearance: She is well-developed. She is obese. She is not ill-appearing.  HENT:     Head: Normocephalic.     Right Ear: Hearing normal.     Left Ear: Hearing normal.  Eyes:     General: Lids are normal.        Right eye: No discharge.        Left eye: No discharge.     Conjunctiva/sclera: Conjunctivae normal.     Pupils: Pupils are equal, round, and reactive to light.  Neck:     Vascular: No carotid bruit.  Cardiovascular:     Rate and Rhythm: Normal rate and regular rhythm.     Heart sounds: Normal heart sounds. No murmur heard.   No gallop.  Pulmonary:     Effort: Pulmonary effort is normal. No accessory muscle usage or respiratory distress.     Breath sounds: Decreased breath sounds and wheezing present.  Comments: Diminished throughout per baseline with occasional expiratory wheezes. Abdominal:     General: Bowel sounds are normal.     Palpations: Abdomen is soft.  Musculoskeletal:     Cervical back: Normal range of motion and neck supple.     Right lower leg: No edema.     Left lower leg: No edema.  Skin:    General: Skin is warm and dry.  Neurological:     Mental Status: She is alert and oriented to person, place, and time.  Psychiatric:        Attention and Perception: Attention normal.        Mood and Affect: Mood normal.        Speech: Speech normal.        Behavior: Behavior normal. Behavior is cooperative.        Thought Content: Thought content normal.    Results for orders placed or performed in visit on 09/03/21  Bayer DCA Hb A1c Waived  Result Value Ref Range   HB A1C (BAYER DCA - WAIVED) 5.5 4.8 - 5.6 %      Assessment & Plan:   Problem List Items Addressed This Visit       Cardiovascular and Mediastinum   Benign hypertension    Chronic, ongoing.  BP well below goal today.  Continue current medication regimen and adjust as  needed.  Recommend she monitor BP at home daily and document for provider + focus on DASH diet.  Obtain CMP, CBC and TSH today.  Refills sent.  If remains on lower side BP level next visit will reduce medications.  Return to office in 6 months.      Relevant Medications   atorvastatin (LIPITOR) 40 MG tablet   furosemide (LASIX) 20 MG tablet   telmisartan-hydrochlorothiazide (MICARDIS HCT) 40-12.5 MG tablet   Other Relevant Orders   Comprehensive metabolic panel   TSH     Respiratory   Centrilobular emphysema (HCC) - Primary    Chronic, ongoing.  Continue current medication regimen and adjust as needed.  Not O2 dependent at this time.  Continue to recommend complete smoking cessation. Spirometry at next visit.  Consider referral to pulmonary if worsening.      Sleep apnea    Chronic, ongoing.   Her current CPAP setting is 20.  She uses her CPAP 100% of the time, using it every night for 8 hours a night.  She is very dedicated to her CPAP use, which benefits her overall health, including her chronic conditions such as COPD, HTN, obesity.  Continue 100% CPAP use.        Digestive   Gastroesophageal reflux disease    Chronic, stable with Omeprazole which offers her benefit.  Continue current medication regimen and check Mag level today.  Refills sent.      Relevant Medications   omeprazole (PRILOSEC) 40 MG capsule   Other Relevant Orders   Magnesium     Endocrine   IFG (impaired fasting glucose)    Ongoing with A1c ranging from 5.7 to 5.9%, today is improved at 5.5%.  At this time will stop Metformin as starting to see some kidney changes and has not benefited weight loss.  Will start Ozempic 0.25 MG weekly with plan to increase to 0.5 MG weekly in 4 weeks.  Educated her on this.  No history of thyroid cancer in patient or her family.  Provided sample in office today and educated her on medication + how to administer.  Plan  for return in 4 weeks for weight check.      Relevant Orders    Bayer DCA Hb A1c Waived (Completed)     Genitourinary   Chronic kidney disease, stage 3a (HCC)    GFR in the 50's recent visits.  Recheck today.  Reminded to avoid NSAIDs.  Monitor function, will stop Metformin at this time and change to Ozempic.  Labs today: CMP.  Increase fluid intake at home.      Relevant Orders   Comprehensive metabolic panel     Other   Anxiety    Chronic, ongoing.  Continue current medication regimen and adjust as needed.  Buspar offering benefit -- recommend Melatonin at night for rest.      Relevant Medications   busPIRone (BUSPAR) 10 MG tablet   DULoxetine (CYMBALTA) 60 MG capsule   Depression, major, single episode, in partial remission (HCC)    Chronic, ongoing.  Continue current medication regimen and adjust as needed. Duloxetine offers benefit to pain and mood.  Refills sent as needed.  Recommend adding Melatonin.  Denies SI/HI.  Return in 6 months.      Relevant Medications   busPIRone (BUSPAR) 10 MG tablet   DULoxetine (CYMBALTA) 60 MG capsule   Hyperlipidemia    Chronic, ongoing.  Continue statin and adjust as needed.  Labs today.      Relevant Medications   atorvastatin (LIPITOR) 40 MG tablet   furosemide (LASIX) 20 MG tablet   telmisartan-hydrochlorothiazide (MICARDIS HCT) 40-12.5 MG tablet   Other Relevant Orders   Comprehensive metabolic panel   Lipid Panel w/o Chol/HDL Ratio   Morbid obesity (HCC)    BMI 47.01 with HTN and COPD.  Recommended eating smaller high protein, low fat meals more frequently and exercising 30 mins a day 5 times a week with a goal of 10-15lb weight loss in the next 3 months. Patient voiced their understanding and motivation to adhere to these recommendations.  Will start Ozempic 0.25 MG weekly with plan to increase to 0.5 MG weekly in 4 weeks.  Educated her on this.  No history of thyroid cancer in patient or her family.  Provided sample in office today and educated her on medication + how to administer.  Plan for  return in 4 weeks for weight check.       Relevant Medications   Semaglutide,0.25 or 0.5MG/DOS, (OZEMPIC, 0.25 OR 0.5 MG/DOSE,) 2 MG/1.5ML SOPN   Chronic pain    Chronic, ongoing with no current pain clinic.  Multi factorial due to morbid obesity and co morbidities.  Would benefit from return to pain clinic, but does not wish to return at this time.  Agrees to return to different pain clinic in future if needed.  Would also benefit from PT time.  Tizanidine script continues and Cymbalta.  Recommend use of Tylenol as needed + Lidocaine patches or Voltaren gel.  Apply heat to back as needed.        Relevant Medications   tiZANidine (ZANAFLEX) 4 MG capsule   DULoxetine (CYMBALTA) 60 MG capsule   gabapentin (NEURONTIN) 800 MG tablet   Nicotine dependence, cigarettes, uncomplicated    I have recommended complete cessation of tobacco use. I have discussed various options available for assistance with tobacco cessation including over the counter methods (Nicotine gum, patch and lozenges). We also discussed prescription options (Chantix, Nicotine Inhaler / Nasal Spray). The patient is not interested in pursuing any prescription tobacco cessation options at this time.  Continue annual lung screening.  Monocytosis    Was followed by oncology, now to return as needed if any consistent elevations noted.  To check CBC Q6MOS -- will recheck today and then every 6 months.      Relevant Orders   Comprehensive metabolic panel   CBC with Differential/Platelet   Mass of right adrenal gland ([place])    Noted on imaging, CT lung scans, since 2017, remaining stable in size at 3.3 cm.  Continue to monitor and consider referral if any changes.  CMP today.      Other Visit Diagnoses     Need for immunization against influenza       Flu vaccine today.   Relevant Orders   Flu Vaccine QUAD High Dose(Fluad) (Completed)        Follow up plan: Return in about 6 weeks (around 10/15/2021) for Weight  check.

## 2021-09-03 NOTE — Assessment & Plan Note (Signed)
Chronic, ongoing with no current pain clinic.  Multi factorial due to morbid obesity and co morbidities.  Would benefit from return to pain clinic, but does not wish to return at this time.  Agrees to return to different pain clinic in future if needed.  Would also benefit from PT time.  Tizanidine script continues and Cymbalta.  Recommend use of Tylenol as needed + Lidocaine patches or Voltaren gel.  Apply heat to back as needed.

## 2021-09-03 NOTE — Assessment & Plan Note (Signed)
Chronic, ongoing.  BP well below goal today.  Continue current medication regimen and adjust as needed.  Recommend she monitor BP at home daily and document for provider + focus on DASH diet.  Obtain CMP, CBC and TSH today.  Refills sent.  If remains on lower side BP level next visit will reduce medications.  Return to office in 6 months.

## 2021-09-03 NOTE — Assessment & Plan Note (Signed)
BMI 47.01 with HTN and COPD.  Recommended eating smaller high protein, low fat meals more frequently and exercising 30 mins a day 5 times a week with a goal of 10-15lb weight loss in the next 3 months. Patient voiced their understanding and motivation to adhere to these recommendations.  Will start Ozempic 0.25 MG weekly with plan to increase to 0.5 MG weekly in 4 weeks.  Educated her on this.  No history of thyroid cancer in patient or her family.  Provided sample in office today and educated her on medication + how to administer.  Plan for return in 4 weeks for weight check.

## 2021-09-03 NOTE — Assessment & Plan Note (Signed)
Noted on imaging, CT lung scans, since 2017, remaining stable in size at 3.3 cm.  Continue to monitor and consider referral if any changes.  CMP today.

## 2021-09-03 NOTE — Patient Instructions (Signed)

## 2021-09-03 NOTE — Assessment & Plan Note (Signed)
Chronic, ongoing.  Continue current medication regimen and adjust as needed.  Buspar offering benefit -- recommend Melatonin at night for rest.

## 2021-09-03 NOTE — Assessment & Plan Note (Signed)
Chronic, ongoing.  Continue current medication regimen and adjust as needed.  Not O2 dependent at this time.  Continue to recommend complete smoking cessation. Spirometry at next visit.  Consider referral to pulmonary if worsening.

## 2021-09-03 NOTE — Assessment & Plan Note (Signed)
GFR in the 50's recent visits.  Recheck today.  Reminded to avoid NSAIDs.  Monitor function, will stop Metformin at this time and change to Ozempic.  Labs today: CMP.  Increase fluid intake at home.

## 2021-09-03 NOTE — Assessment & Plan Note (Signed)
Chronic, ongoing.  Continue statin and adjust as needed.  Labs today.

## 2021-09-03 NOTE — Assessment & Plan Note (Signed)
Chronic, ongoing.   Her current CPAP setting is 20.  She uses her CPAP 100% of the time, using it every night for 8 hours a night.  She is very dedicated to her CPAP use, which benefits her overall health, including her chronic conditions such as COPD, HTN, obesity.  Continue 100% CPAP use.

## 2021-09-03 NOTE — Assessment & Plan Note (Signed)
Chronic, stable with Omeprazole which offers her benefit.  Continue current medication regimen and check Mag level today.  Refills sent.

## 2021-09-04 LAB — TSH: TSH: 2.56 u[IU]/mL (ref 0.450–4.500)

## 2021-09-04 LAB — CBC WITH DIFFERENTIAL/PLATELET
Basophils Absolute: 0.1 10*3/uL (ref 0.0–0.2)
Basos: 1 %
EOS (ABSOLUTE): 0.3 10*3/uL (ref 0.0–0.4)
Eos: 2 %
Hematocrit: 38.3 % (ref 34.0–46.6)
Hemoglobin: 12.2 g/dL (ref 11.1–15.9)
Immature Grans (Abs): 0 10*3/uL (ref 0.0–0.1)
Immature Granulocytes: 0 %
Lymphocytes Absolute: 4.3 10*3/uL — ABNORMAL HIGH (ref 0.7–3.1)
Lymphs: 35 %
MCH: 27.4 pg (ref 26.6–33.0)
MCHC: 31.9 g/dL (ref 31.5–35.7)
MCV: 86 fL (ref 79–97)
Monocytes Absolute: 1.4 10*3/uL — ABNORMAL HIGH (ref 0.1–0.9)
Monocytes: 11 %
Neutrophils Absolute: 6.1 10*3/uL (ref 1.4–7.0)
Neutrophils: 51 %
Platelets: 328 10*3/uL (ref 150–450)
RBC: 4.45 x10E6/uL (ref 3.77–5.28)
RDW: 14.1 % (ref 11.7–15.4)
WBC: 12.2 10*3/uL — ABNORMAL HIGH (ref 3.4–10.8)

## 2021-09-04 LAB — LIPID PANEL W/O CHOL/HDL RATIO
Cholesterol, Total: 179 mg/dL (ref 100–199)
HDL: 50 mg/dL (ref >39)
LDL Chol Calc (NIH): 101 mg/dL — ABNORMAL HIGH (ref 0–99)
Triglycerides: 160 mg/dL — ABNORMAL HIGH (ref 0–149)
VLDL Cholesterol Cal: 28 mg/dL (ref 5–40)

## 2021-09-04 LAB — COMPREHENSIVE METABOLIC PANEL
ALT: 17 IU/L (ref 0–32)
AST: 24 IU/L (ref 0–40)
Albumin/Globulin Ratio: 1.4 (ref 1.2–2.2)
Albumin: 4 g/dL (ref 3.8–4.8)
Alkaline Phosphatase: 122 IU/L — ABNORMAL HIGH (ref 44–121)
BUN/Creatinine Ratio: 15 (ref 12–28)
BUN: 17 mg/dL (ref 8–27)
Bilirubin Total: <0.2 mg/dL (ref 0.0–1.2)
CO2: 22 mmol/L (ref 20–29)
Calcium: 10.2 mg/dL (ref 8.7–10.3)
Chloride: 103 mmol/L (ref 96–106)
Creatinine, Ser: 1.11 mg/dL — ABNORMAL HIGH (ref 0.57–1.00)
Globulin, Total: 2.9 g/dL (ref 1.5–4.5)
Glucose: 108 mg/dL — ABNORMAL HIGH (ref 70–99)
Potassium: 5 mmol/L (ref 3.5–5.2)
Sodium: 141 mmol/L (ref 134–144)
Total Protein: 6.9 g/dL (ref 6.0–8.5)
eGFR: 55 mL/min/{1.73_m2} — ABNORMAL LOW (ref >59)

## 2021-09-04 LAB — MAGNESIUM: Magnesium: 1.9 mg/dL (ref 1.6–2.3)

## 2021-09-04 NOTE — Progress Notes (Signed)
Contacted via [place] afternoon Cecia, your labs have returned: - You continue to show some mild kidney disease with a low eGFR and elevated creatinine, we will continue to monitor this closely.   - Thyroid and magnesium are normal. - CBC continues to show mild elevations, but no worsening, we will continue to monitor every 6 months. - Cholesterol levels remain above goal, are you taking Atorvastatin daily?  If so I recommend increasing this to 80 MG dosing daily.  Would you be okay with this? Any questions? Keep being amazing!!  Thank you for allowing me to participate in your care.  I appreciate you. Kindest regards, Bransen Fassnacht

## 2021-09-22 ENCOUNTER — Telehealth: Payer: Self-pay

## 2021-09-22 ENCOUNTER — Other Ambulatory Visit: Payer: Self-pay | Admitting: *Deleted

## 2021-09-22 DIAGNOSIS — Z87891 Personal history of nicotine dependence: Secondary | ICD-10-CM

## 2021-09-22 DIAGNOSIS — F17210 Nicotine dependence, cigarettes, uncomplicated: Secondary | ICD-10-CM

## 2021-09-22 NOTE — Telephone Encounter (Signed)
Prior Authorization was initiated via CoverMyMeds for Rx Gabapentin 800 mg. PA response was "Available without authorization."  KEY: [id number]

## 2021-10-09 DIAGNOSIS — G4733 Obstructive sleep apnea (adult) (pediatric): Secondary | ICD-10-CM | POA: Diagnosis not present

## 2021-10-15 ENCOUNTER — Other Ambulatory Visit: Payer: Self-pay

## 2021-10-15 ENCOUNTER — Encounter: Payer: Self-pay | Admitting: Nurse Practitioner

## 2021-10-15 ENCOUNTER — Ambulatory Visit (INDEPENDENT_AMBULATORY_CARE_PROVIDER_SITE_OTHER): Payer: PPO | Admitting: Nurse Practitioner

## 2021-10-15 DIAGNOSIS — J432 Centrilobular emphysema: Secondary | ICD-10-CM | POA: Diagnosis not present

## 2021-10-15 DIAGNOSIS — H938X3 Other specified disorders of ear, bilateral: Secondary | ICD-10-CM | POA: Diagnosis not present

## 2021-10-15 DIAGNOSIS — Z760 Encounter for issue of repeat prescription: Secondary | ICD-10-CM

## 2021-10-15 DIAGNOSIS — G4733 Obstructive sleep apnea (adult) (pediatric): Secondary | ICD-10-CM | POA: Diagnosis not present

## 2021-10-15 MED ORDER — HYDROXYZINE HCL 10 MG PO TABS: 10.0000 mg | ORAL_TABLET | Freq: Three times a day (TID) | ORAL | 4 refills | Status: DC | PRN

## 2021-10-15 MED ORDER — TRIAMCINOLONE ACETONIDE 0.1 % EX CREA: 1.0000 "application " | TOPICAL_CREAM | Freq: Two times a day (BID) | CUTANEOUS | 0 refills | Status: DC

## 2021-10-15 NOTE — Assessment & Plan Note (Signed)
BMI 47.01 with HTN and COPD.  Recommended eating smaller high protein, low fat meals more frequently and exercising 30 mins a day 5 times a week with a goal of 10-15lb weight loss in the next 3 months. Patient voiced their understanding and motivation to adhere to these recommendations.  Tolerating Ozempic with 5 pounds loss thus far.  Educated her on this.  No history of thyroid cancer in patient or her family.  Continue this regimen and adjust further next visit if needed.

## 2021-10-15 NOTE — Assessment & Plan Note (Addendum)
Chronic, ongoing -- will work with insurance to see if we can obtain new supplies for patient.   Her current CPAP setting is 20.  She uses her CPAP 100% of the time, using it every night for 8 hours a night.  She is very dedicated to her CPAP use, which benefits her overall health, including her chronic conditions such as COPD, HTN, obesity.  Continue 100% CPAP use.

## 2021-10-15 NOTE — Assessment & Plan Note (Signed)
Chronic, ongoing.  Continue current medication regimen and adjust as needed.  Not O2 dependent at this time.  Continue to recommend complete smoking cessation. Spirometry at next visit.  Consider referral to pulmonary if worsening.

## 2021-10-15 NOTE — Progress Notes (Signed)
BP 136/67   Pulse 94   Temp 98.3 F (36.8 C) (Oral)   Wt 261 lb 9.6 oz (118.7 kg)   LMP  (LMP Unknown)   SpO2 99%   BMI 46.34 kg/m    Subjective:    Patient ID: [name], female    DOB: [birth year], 64 y.o.   MRN: [id number]  HPI: [name] is a 65 y.o. female  Chief Complaint  Patient presents with   Weight Check    Patient is here for follow up on weight check.    Ear Problem    Patient states since last week she has noticed a pulse like sensation in both ears. Patient states it started this morning in her left ear was ringing and then her right ear had the pulse sensation.    Medication Refill    Patent is requesting a refill on her Hydroxyzine and is requesting a 3 month supply to be sent to her pharmacy.    Needs refill on medication today.   WEIGHT CHECK: Last visit started Ozempic and stopped Metformin for weight loss.  Tolerating Ozempic well, no ADR.  Does notice a decrease in appetite with this.  Her goal is to get to below 200 pounds.  Eating supper and sometimes will eat ice cream on occasion.  Does not eat much during day, as does not go to bed until later.  Currently is on the 0.5 MG dosing Ozempic, is on week 2 of this.  EAR PAIN This morning left ear was ringing and then right ear started pulsating.  No fever or sinus drainage recently.  Continues to smoke, about 1 PPD -- needs to call to schedule lung screening.   Duration: days Involved ear(s): bilateral Fever: no Otorrhea: no Upper respiratory infection symptoms: no Pruritus: no Hearing loss: no Water immersion no Using Q-tips: yes Recurrent otitis media: no Status: stable Treatments attempted: none   SLEEP APNEA Reports he CPAP supplies have been denied by insurance and she needs them ASAP.  Had sleep study many years ago, in need of new supplies now.  Without CPAP she can not function. Sleep apnea status: controlled Duration: chronic Satisfied with current treatment?:  yes CPAP  use:  yes Sleep quality with CPAP use: excellent Treament compliance:excellent compliance Last sleep study: many years ago Treatments attempted: CPAP Wakes feeling refreshed:   with CPAP Daytime hypersomnolence:   without CPAP Fatigue:   without CPAP Insomnia:  yes Good sleep hygiene:  yes Difficulty falling asleep:  yes Difficulty staying asleep:  no Obesity:  yes Hypertension: yes  Pulmonary hypertension:  no Coronary artery disease:  no   Relevant past medical, surgical, family and social history reviewed and updated as indicated. Interim medical history since our last visit reviewed. Allergies and medications reviewed and updated.  Review of Systems  Constitutional:  Negative for activity change, appetite change, diaphoresis, fatigue and fever.  HENT:  Negative for ear discharge and ear pain.   Respiratory:  Negative for cough, chest tightness and shortness of breath.   Cardiovascular:  Negative for chest pain, palpitations and leg swelling.  Gastrointestinal: Negative.   Endocrine: Negative for cold intolerance, heat intolerance, polydipsia, polyphagia and polyuria.  Neurological: Negative.   Psychiatric/Behavioral: Negative.     Per HPI unless specifically indicated above     Objective:    BP 136/67   Pulse 94   Temp 98.3 F (36.8 C) (Oral)   Wt 261 lb 9.6 oz (118.7 kg)  LMP  (LMP Unknown)   SpO2 99%   BMI 46.34 kg/m   Wt Readings from Last 3 Encounters:  10/15/21 261 lb 9.6 oz (118.7 kg)  09/03/21 265 lb 6.4 oz (120.4 kg)  05/19/21 251 lb (113.9 kg)    Physical Exam Vitals and nursing note reviewed.  Constitutional:      General: She is awake. She is not in acute distress.    Appearance: She is well-developed. She is obese. She is not ill-appearing.  HENT:     Head: Normocephalic.     Right Ear: Hearing, tympanic membrane, ear canal and external ear normal.     Left Ear: Hearing, tympanic membrane, ear canal and external ear normal.  Eyes:      General: Lids are normal.        Right eye: No discharge.        Left eye: No discharge.     Conjunctiva/sclera: Conjunctivae normal.     Pupils: Pupils are equal, round, and reactive to light.  Neck:     Vascular: No carotid bruit.  Cardiovascular:     Rate and Rhythm: Normal rate and regular rhythm.     Heart sounds: Normal heart sounds. No murmur heard.   No gallop.  Pulmonary:     Effort: Pulmonary effort is normal. No accessory muscle usage or respiratory distress.     Breath sounds: Decreased breath sounds and wheezing present.     Comments: Diminished throughout per baseline with occasional expiratory wheezes. Abdominal:     General: Bowel sounds are normal.     Palpations: Abdomen is soft.  Musculoskeletal:     Cervical back: Normal range of motion and neck supple.     Right lower leg: No edema.     Left lower leg: No edema.  Skin:    General: Skin is warm and dry.  Neurological:     Mental Status: She is alert and oriented to person, place, and time.  Psychiatric:        Attention and Perception: Attention normal.        Mood and Affect: Mood normal.        Speech: Speech normal.        Behavior: Behavior normal. Behavior is cooperative.        Thought Content: Thought content normal.    Results for orders placed or performed in visit on 09/03/21  Bayer DCA Hb A1c Waived  Result Value Ref Range   HB A1C (BAYER DCA - WAIVED) 5.5 4.8 - 5.6 %  Comprehensive metabolic panel  Result Value Ref Range   Glucose 108 (H) 70 - 99 mg/dL   BUN 17 8 - 27 mg/dL   Creatinine, Ser 1.11 (H) 0.57 - 1.00 mg/dL   eGFR 55 (L) >59 mL/min/1.73   BUN/Creatinine Ratio 15 12 - 28   Sodium 141 134 - 144 mmol/L   Potassium 5.0 3.5 - 5.2 mmol/L   Chloride 103 96 - 106 mmol/L   CO2 22 20 - 29 mmol/L   Calcium 10.2 8.7 - 10.3 mg/dL   Total Protein 6.9 6.0 - 8.5 g/dL   Albumin 4.0 3.8 - 4.8 g/dL   Globulin, Total 2.9 1.5 - 4.5 g/dL   Albumin/Globulin Ratio 1.4 1.2 - 2.2   Bilirubin  Total <0.2 0.0 - 1.2 mg/dL   Alkaline Phosphatase 122 (H) 44 - 121 IU/L   AST 24 0 - 40 IU/L   ALT 17 0 - 32 IU/L  TSH  Result Value Ref Range   TSH 2.560 0.450 - 4.500 uIU/mL  Magnesium  Result Value Ref Range   Magnesium 1.9 1.6 - 2.3 mg/dL  Lipid Panel w/o Chol/HDL Ratio  Result Value Ref Range   Cholesterol, Total 179 100 - 199 mg/dL   Triglycerides 160 (H) 0 - 149 mg/dL   HDL 50 >39 mg/dL   VLDL Cholesterol Cal 28 5 - 40 mg/dL   LDL Chol Calc (NIH) 101 (H) 0 - 99 mg/dL  CBC with Differential/Platelet  Result Value Ref Range   WBC 12.2 (H) 3.4 - 10.8 x10E3/uL   RBC 4.45 3.77 - 5.28 x10E6/uL   Hemoglobin 12.2 11.1 - 15.9 g/dL   Hematocrit 38.3 34.0 - 46.6 %   MCV 86 79 - 97 fL   MCH 27.4 26.6 - 33.0 pg   MCHC 31.9 31.5 - 35.7 g/dL   RDW 14.1 11.7 - 15.4 %   Platelets 328 150 - 450 x10E3/uL   Neutrophils 51 Not Estab. %   Lymphs 35 Not Estab. %   Monocytes 11 Not Estab. %   Eos 2 Not Estab. %   Basos 1 Not Estab. %   Neutrophils Absolute 6.1 1.4 - 7.0 x10E3/uL   Lymphocytes Absolute 4.3 (H) 0.7 - 3.1 x10E3/uL   Monocytes Absolute 1.4 (H) 0.1 - 0.9 x10E3/uL   EOS (ABSOLUTE) 0.3 0.0 - 0.4 x10E3/uL   Basophils Absolute 0.1 0.0 - 0.2 x10E3/uL   Immature Granulocytes 0 Not Estab. %   Immature Grans (Abs) 0.0 0.0 - 0.1 x10E3/uL      Assessment & Plan:   Problem List Items Addressed This Visit       Respiratory   Centrilobular emphysema (HCC)    Chronic, ongoing.  Continue current medication regimen and adjust as needed.  Not O2 dependent at this time.  Continue to recommend complete smoking cessation. Spirometry at next visit.  Consider referral to pulmonary if worsening.      Sleep apnea    Chronic, ongoing -- will work with insurance to see if we can obtain new supplies for patient.   Her current CPAP setting is 20.  She uses her CPAP 100% of the time, using it every night for 8 hours a night.  She is very dedicated to her CPAP use, which benefits her overall  health, including her chronic conditions such as COPD, HTN, obesity.  Continue 100% CPAP use.        Other   Morbid obesity ([place]) - Primary    BMI 47.01 with HTN and COPD.  Recommended eating smaller high protein, low fat meals more frequently and exercising 30 mins a day 5 times a week with a goal of 10-15lb weight loss in the next 3 months. Patient voiced their understanding and motivation to adhere to these recommendations.  Tolerating Ozempic with 5 pounds loss thus far.  Educated her on this.  No history of thyroid cancer in patient or her family.  Continue this regimen and adjust further next visit if needed.       Other Visit Diagnoses     Pressure sensation in both ears       Overall normal exam, recommend she start Claritin daily for one week and if ongoing then return to office.   Medicine refill       Refills sent for medications requested.        Follow up plan: Return in about 8 weeks (around 12/10/2021) for Weight check, HTN/HLD, IFG, CKD.

## 2021-10-15 NOTE — Patient Instructions (Signed)

## 2021-10-17 ENCOUNTER — Ambulatory Visit (INDEPENDENT_AMBULATORY_CARE_PROVIDER_SITE_OTHER): Payer: PPO | Admitting: Licensed Clinical Social Worker

## 2021-10-17 ENCOUNTER — Encounter: Payer: Self-pay | Admitting: Nurse Practitioner

## 2021-10-17 DIAGNOSIS — F419 Anxiety disorder, unspecified: Secondary | ICD-10-CM

## 2021-10-17 DIAGNOSIS — M1611 Unilateral primary osteoarthritis, right hip: Secondary | ICD-10-CM

## 2021-10-17 DIAGNOSIS — F324 Major depressive disorder, single episode, in partial remission: Secondary | ICD-10-CM

## 2021-10-17 DIAGNOSIS — N1831 Chronic kidney disease, stage 3a: Secondary | ICD-10-CM

## 2021-10-17 DIAGNOSIS — G894 Chronic pain syndrome: Secondary | ICD-10-CM

## 2021-10-17 DIAGNOSIS — M5136 Other intervertebral disc degeneration, lumbar region: Secondary | ICD-10-CM

## 2021-10-17 DIAGNOSIS — E782 Mixed hyperlipidemia: Secondary | ICD-10-CM

## 2021-10-20 NOTE — Patient Instructions (Signed)
Visit Information  Thank you for taking time to visit with me today. Please don't hesitate to contact me if I can be of assistance to you before our next scheduled telephone appointment.  Following are the goals we discussed today:  Patient Self Care Activities:  Call provider office for new concerns or questions Attend all scheduled appointments with providers Continue compliance with medication management Continue utilizing healthy coping skills to assist with management of symptoms  Our next appointment is by telephone on 01/05/22 at 3:30 PM  Please call the care guide team at [phone] if you need to cancel or reschedule your appointment.   Please call 911 if you are experiencing a Mental Health or [place] or need someone to talk to.  Patient verbalizes understanding of instructions provided today and agrees to view in [place].   Christa See, MSW, [email or web address] Phone [phone] 9:38 AM

## 2021-10-20 NOTE — Chronic Care Management (AMB) (Signed)
Chronic Care Management    Clinical Social Work Note  10/20/2021 Name: [name] MRN: [id number] DOB: 17-Jan-1956  [name] is a 65 y.o. year old female who is a primary care patient of Cannady, Barbaraann Faster, NP. The CCM team was consulted to assist the patient with chronic disease management and/or care coordination needs related to: Mental Health Counseling and Resources.   Engaged with patient by telephone for follow up visit in response to provider referral for social work chronic care management and care coordination services.   Consent to Services:  The patient was given information about Chronic Care Management services, agreed to services, and gave verbal consent prior to initiation of services.  Please see initial visit note for detailed documentation.   Patient agreed to services and consent obtained.   Consent to Services:  The patient was given information about Care Management services, agreed to services, and gave verbal consent prior to initiation of services.  Please see initial visit note for detailed documentation.   Patient agreed to services today and consent obtained.  Engaged with patient by phone in response to provider referral for social work care coordination services:  Assessment/Interventions:  Patient continues to maintain positive progress with care plan goals. CCM LCSW discussed strategies to assist with making healthy changes. Patient was strongly encouraged to identify non-scale victories and identifying the WHY to assist with maintaining motivation.  See Care Plan below for interventions and patient self-care activities.  Recent life changes or stressors: Management of health conditions  Recommendation: Patient may benefit from, and is in agreement work with LCSW to address care coordination needs and will continue to work with the clinical team to address health care and disease management related needs.   Follow up Plan: Patient would like  continued follow-up from CCM LCSW .  per patient's request will follow up in 01/05/22.  Will call office if needed prior to next encounter.  SDOH (Social Determinants of Health) assessments and interventions performed:    Advanced Directives Status: Not addressed in this encounter.  CCM Care Plan  No Known Allergies  Outpatient Encounter Medications as of 10/17/2021  Medication Sig   acetaminophen (TYLENOL) 500 MG tablet Take 1,000 mg by mouth in the morning and at bedtime.   albuterol (PROAIR HFA) 108 (90 Base) MCG/ACT inhaler Inhale 2 puffs into the lungs every 6 (six) hours as needed for wheezing or shortness of breath.   ascorbic acid (VITAMIN C) 500 MG tablet Take 500 mg by mouth daily.   aspirin 81 MG tablet Take 81 mg by mouth daily.   atorvastatin (LIPITOR) 40 MG tablet Take 1 tablet (40 mg total) by mouth daily.   busPIRone (BUSPAR) 10 MG tablet Take 2 tablets (20 mg total) by mouth 3 (three) times daily.   Calcium Citrate-Vitamin D (CALCIUM + D PO) Take 1,000 mg by mouth daily.   DULoxetine (CYMBALTA) 60 MG capsule Take 1 capsule (60 mg total) by mouth daily.   furosemide (LASIX) 20 MG tablet Take 1 tablet (20 mg total) by mouth daily.   gabapentin (NEURONTIN) 800 MG tablet Take 1 tablet (800 mg total) by mouth 3 (three) times daily.   hydrOXYzine (ATARAX/VISTARIL) 10 MG tablet Take 1 tablet (10 mg total) by mouth 3 (three) times daily as needed.   Melatonin 10 MG TABS Take 1 tablet by mouth at bedtime.   Multiple Vitamins-Minerals (MULTIVITAMIN WITH MINERALS) tablet Take 1 tablet by mouth daily.   omeprazole (PRILOSEC) 40 MG capsule  Take 1 capsule (40 mg total) by mouth daily.   potassium chloride SA (KLOR-CON) 20 MEQ tablet Take 1 tablet (20 mEq total) by mouth daily.   Semaglutide,0.25 or 0.5MG /DOS, (OZEMPIC, 0.25 OR 0.5 MG/DOSE,) 2 MG/1.5ML SOPN Start with 0.25MG  once a week x 4 weeks, then increase to 0.5MG  weekly.   SPIRIVA RESPIMAT 2.5 MCG/ACT AERS INHALE 2 SPRAY(S) BY  MOUTH ONCE DAILY   telmisartan-hydrochlorothiazide (MICARDIS HCT) 40-12.5 MG tablet Take 1 tablet by mouth daily.   tiZANidine (ZANAFLEX) 4 MG capsule Take 1 capsule (4 mg total) by mouth 3 (three) times daily.   triamcinolone cream (KENALOG) 0.1 % Apply 1 application topically 2 (two) times daily.   vitamin B-12 (CYANOCOBALAMIN) 1000 MCG tablet Take 1,000 mcg by mouth daily.   No facility-administered encounter medications on file as of 10/17/2021.    Patient Active Problem List   Diagnosis Date Noted   Mass of right adrenal gland ([place]) 03/04/2021   Monocytosis 02/28/2021   Chronic kidney disease, stage 3a ([place]) 03/04/2020   Aortic atherosclerosis ([place]) 10/03/2019   IFG (impaired fasting glucose) 09/04/2019   Vitamin D deficiency 12/29/2018   Osteoarthritis of right hip 12/27/2017   Advanced care planning/counseling discussion 06/07/2017   Benign hypertension 10/28/2015   Nicotine dependence, cigarettes, uncomplicated 42/70/6237   Facet syndrome, lumbar 07/11/2015   Spinal stenosis, lumbar region, with neurogenic claudication 07/11/2015   Status post cervical spinal fusion 07/11/2015   DJD (degenerative joint disease) of knee 07/11/2015   Chronic pain 05/17/2015   Anxiety 05/14/2015   Depression, major, single episode, in partial remission ([place]) 05/14/2015   DDD (degenerative disc disease), lumbar 05/14/2015   Centrilobular emphysema ([place]) 05/14/2015   Hyperlipidemia 05/14/2015   Gastroesophageal reflux disease 05/14/2015   Radiculopathy, lumbar region 05/14/2015   Sleep apnea 05/14/2015   Morbid obesity ([place]) 05/14/2015   Benign essential tremor 05/14/2015    Conditions to be addressed/monitored: Anxiety and Depression; Mental Health Concerns   Care Plan : General Social Work (Adult)  Updates made by Kathleen Chesterfield, LCSW since 10/20/2021 12:00 AM     Problem: Quality of Life (General Plan of Care)      Long-Range Goal: Quality of Life Maintained   Start Date:  02/17/2021  This Visit's Progress: On track  Recent Progress: On track  Priority: Medium  Note:   Timeframe:  Long-Range Goal Priority:  Medium Start Date:   02/17/21                       Expected End Date:  01/27/22                   Follow Up Date-01/05/22  Current Barriers:  Chronic Mental Health needs related to Panic Disorder and Depression Financial constraints related to managing health care expenses Limited social support ADL IADL limitations Mental Health Concerns  Social Isolation Inability to perform ADL's independently Inability to perform IADL's independently Suicidal Ideation/Homicidal Ideation: No Clinical Social Work Goal(s):  Over the next 120 days, patient will work with SW bi-monthly by telephone or in person to reduce or manage symptoms related to anxiety and depression. Over the next 120 days, patient will demonstrate improved health management independence as evidenced by implementing healthy self-care into her daily routine such as: attending all medical appointments, deep breathing exercsies, taking time for self-reflection, grounding exercises to combat panic attacks, taking medications as prescribed, drinking water and daily exercise to improve mobility. Interventions: Patient interviewed and appropriate assessments  performed Patient reports that her depression and anxiety symptoms are managed currently  Patient reports, I'm having trouble with my left ear. The pain goes down to my jaw. It's been like this for several weeks and I'm in constant pain" Patient denies any trauma to the ear. Has difficulty sleeping because she sleeps on her left side. Patient takes Tylenol that assists with arthritis in hands only CCM LCSW discussed strategies to assist with pain management. Patient identified the use of electric blankets, gaming, and spending time with others helpful to promote mood and cope with stressors/pain Patient utilizes a walker to promote safety 11/18:  Patient reports goal to walk more in the home to promote management of health conditions. Son assists with preparing her meals Patient reports that the Melatonin has continued to assist with sleep hygiene. She takes meds daily Patient participates in medication management. Denies any adverse side effects Patient enjoys spending time with family and playing games on computer. Her sister visits daily CCM LCSW discussed strategies to assist with grief with the upcoming change in season/holidays, etc Patient was encouraged to establish a plan if triggered due to loss of spouse CCM LCSW discussed strategies to assist with making healthy changes. Patient was strongly encouraged to identify non-scale victories and identifying the WHY to assist with maintaining motivation (surgeries and to increase independence) Patient is not interested in therapy Patient denies any resource needs Patient reports having a strong support system of family members. She shares that her son lives with her and he is disabled but he is able to assist her when needed. Patient's sister and brother also come daily to check on patient which provides patient ongoing socialization.  LCSW discussed coping skills for anxiety such a grounding exercises that she can use when panic arises LCSW used empathetic and active and reflective listening, validated patient's feelings/concerns, and provided emotional support. LCSW provided self-care education to help manage her multiple health conditions and improve her mood.  Discussed plans with patient for ongoing care management follow up and provided patient with direct contact information for care management team Patient continues to have insomnia and goes to bed at 4-5 am. Patient reports that she has been this way since her spouse passed 3 years ago Mindfulness or Relaxation Training, Active listening / Reflection utilized , Emotional Supportive Provided, and Verbalization of feelings encouraged   Emotional/Supportive Counseling provided during session Collaboration with PCP regarding development and update of comprehensive plan of care as evidenced by provider attestation and co-signature Inter-disciplinary care team collaboration (see longitudinal plan of care) Patient Self Care Activities:  Call provider office for new concerns or questions Attend all scheduled appointments with providers Continue compliance with medication management Continue utilizing healthy coping skills to assist with management of symptoms      Christa See, MSW, [email or web address] Phone [phone] 9:36 AM

## 2021-10-21 ENCOUNTER — Ambulatory Visit
Admission: RE | Admit: 2021-10-21 | Discharge: 2021-10-21 | Disposition: A | Discharge status: Home / Self Care | Payer: PPO | Source: Ambulatory Visit | Attending: Acute Care | Admitting: Acute Care

## 2021-10-21 ENCOUNTER — Other Ambulatory Visit: Payer: Self-pay

## 2021-10-21 DIAGNOSIS — F17210 Nicotine dependence, cigarettes, uncomplicated: Secondary | ICD-10-CM

## 2021-10-21 DIAGNOSIS — Z87891 Personal history of nicotine dependence: Secondary | ICD-10-CM | POA: Diagnosis not present

## 2021-10-26 ENCOUNTER — Encounter: Payer: Self-pay | Admitting: Nurse Practitioner

## 2021-10-27 MED ORDER — ATORVASTATIN CALCIUM 80 MG PO TABS: 80.0000 mg | ORAL_TABLET | Freq: Every day | ORAL | 4 refills | Status: DC

## 2021-10-28 ENCOUNTER — Other Ambulatory Visit: Payer: Self-pay | Admitting: Acute Care

## 2021-10-28 DIAGNOSIS — Z87891 Personal history of nicotine dependence: Secondary | ICD-10-CM

## 2021-10-28 DIAGNOSIS — F17210 Nicotine dependence, cigarettes, uncomplicated: Secondary | ICD-10-CM

## 2021-10-28 DIAGNOSIS — F17200 Nicotine dependence, unspecified, uncomplicated: Secondary | ICD-10-CM

## 2021-10-29 DIAGNOSIS — E782 Mixed hyperlipidemia: Secondary | ICD-10-CM

## 2021-10-29 DIAGNOSIS — F324 Major depressive disorder, single episode, in partial remission: Secondary | ICD-10-CM

## 2021-10-29 DIAGNOSIS — M1611 Unilateral primary osteoarthritis, right hip: Secondary | ICD-10-CM

## 2021-10-29 DIAGNOSIS — N1831 Chronic kidney disease, stage 3a: Secondary | ICD-10-CM

## 2021-11-18 ENCOUNTER — Other Ambulatory Visit: Payer: PPO

## 2021-11-20 ENCOUNTER — Encounter: Payer: Self-pay | Admitting: Nurse Practitioner

## 2021-11-28 DIAGNOSIS — G4733 Obstructive sleep apnea (adult) (pediatric): Secondary | ICD-10-CM | POA: Diagnosis not present

## 2021-12-04 ENCOUNTER — Telehealth: Payer: Self-pay | Admitting: Nurse Practitioner

## 2021-12-04 NOTE — Telephone Encounter (Signed)
Copied from [place] [id number]. Topic: Medicare AWV >> Dec 04, 2021 10:25 AM Lavonia Drafts wrote: Reason for CRM: Left message for patient to call back and schedule Medicare Annual Wellness Visit (AWV) to be done virtually or by telephone.  No hx of AWV eligible as of 11/30/21  Please schedule at anytime with [facility]-Nurse Health Advisor.      26 Minutes appointment   Any questions, please call me at [phone]

## 2021-12-05 NOTE — Telephone Encounter (Signed)
Patient called to schedule AWV. Please call back

## 2021-12-05 NOTE — Telephone Encounter (Signed)
Appt scheduled

## 2021-12-10 ENCOUNTER — Ambulatory Visit (INDEPENDENT_AMBULATORY_CARE_PROVIDER_SITE_OTHER): Payer: PPO | Admitting: Nurse Practitioner

## 2021-12-10 ENCOUNTER — Encounter: Payer: Self-pay | Admitting: Nurse Practitioner

## 2021-12-10 ENCOUNTER — Other Ambulatory Visit: Payer: Self-pay

## 2021-12-10 DIAGNOSIS — F419 Anxiety disorder, unspecified: Secondary | ICD-10-CM

## 2021-12-10 DIAGNOSIS — F324 Major depressive disorder, single episode, in partial remission: Secondary | ICD-10-CM | POA: Diagnosis not present

## 2021-12-10 DIAGNOSIS — E782 Mixed hyperlipidemia: Secondary | ICD-10-CM | POA: Diagnosis not present

## 2021-12-10 DIAGNOSIS — Z1231 Encounter for screening mammogram for malignant neoplasm of breast: Secondary | ICD-10-CM

## 2021-12-10 DIAGNOSIS — J432 Centrilobular emphysema: Secondary | ICD-10-CM | POA: Diagnosis not present

## 2021-12-10 DIAGNOSIS — G894 Chronic pain syndrome: Secondary | ICD-10-CM | POA: Diagnosis not present

## 2021-12-10 DIAGNOSIS — I700 Atherosclerosis of aorta: Secondary | ICD-10-CM | POA: Diagnosis not present

## 2021-12-10 DIAGNOSIS — D72821 Monocytosis (symptomatic): Secondary | ICD-10-CM

## 2021-12-10 DIAGNOSIS — N1831 Chronic kidney disease, stage 3a: Secondary | ICD-10-CM | POA: Diagnosis not present

## 2021-12-10 DIAGNOSIS — I10 Essential (primary) hypertension: Secondary | ICD-10-CM | POA: Diagnosis not present

## 2021-12-10 DIAGNOSIS — F17210 Nicotine dependence, cigarettes, uncomplicated: Secondary | ICD-10-CM

## 2021-12-10 DIAGNOSIS — Z780 Asymptomatic menopausal state: Secondary | ICD-10-CM

## 2021-12-10 DIAGNOSIS — E278 Other specified disorders of adrenal gland: Secondary | ICD-10-CM | POA: Diagnosis not present

## 2021-12-10 DIAGNOSIS — R7301 Impaired fasting glucose: Secondary | ICD-10-CM | POA: Diagnosis not present

## 2021-12-10 DIAGNOSIS — K2100 Gastro-esophageal reflux disease with esophagitis, without bleeding: Secondary | ICD-10-CM

## 2021-12-10 DIAGNOSIS — G4733 Obstructive sleep apnea (adult) (pediatric): Secondary | ICD-10-CM | POA: Diagnosis not present

## 2021-12-10 LAB — MICROALBUMIN, URINE WAIVED
Creatinine, Urine Waived: 50 mg/dL (ref 10–300)
Microalb, Ur Waived: 10 mg/L (ref 0–19)
Microalb/Creat Ratio: <30 mg/g (ref <30)

## 2021-12-10 LAB — BAYER DCA HB A1C WAIVED: HB A1C (BAYER DCA - WAIVED): 5.6 % (ref 4.8–5.6)

## 2021-12-10 MED ORDER — SEMAGLUTIDE (1 MG/DOSE) 4 MG/3ML ~~LOC~~ SOPN: 1.0000 mg | PEN_INJECTOR | SUBCUTANEOUS | 4 refills | Status: DC

## 2021-12-10 MED ORDER — SPIRIVA RESPIMAT 2.5 MCG/ACT IN AERS
INHALATION_SPRAY | RESPIRATORY_TRACT | 5 refills | Status: DC
Start: 2021-12-10 — End: 2022-07-02

## 2021-12-10 NOTE — Assessment & Plan Note (Signed)
Chronic, ongoing.  Continue statin and adjust as needed -- recent increase to Atorvastatin 80 MG.  Labs today.

## 2021-12-10 NOTE — Assessment & Plan Note (Signed)
Chronic, ongoing -- continue 100% use of this.  Received new equipment.

## 2021-12-10 NOTE — Assessment & Plan Note (Signed)
Chronic, ongoing, noted on lung CT 10/02/2019.  Recommend she continue ASA and statin daily for prevention.  Recommend complete cessation of smoking. 

## 2021-12-10 NOTE — Assessment & Plan Note (Signed)
Chronic, ongoing.  Continue current medication regimen and adjust as needed. Duloxetine offers benefit to pain and mood.  Refills sent as needed.  Recommend adding Melatonin.  Denies SI/HI.  Return in 6 months.

## 2021-12-10 NOTE — Assessment & Plan Note (Signed)
Chronic, ongoing.  Continue current medication regimen and adjust as needed.  Not O2 dependent at this time.  Continue to recommend complete smoking cessation. Spirometry at next visit.  Consider referral to pulmonary if worsening.

## 2021-12-10 NOTE — Assessment & Plan Note (Signed)
GFR in the 50's recent visits, recent was 68.  Recheck today.  Reminded to avoid NSAIDs.  May need to adjust Gabapentin based on labs today, will review and determine need to reduce -- discussed with patient.  Labs today: CMP and urine ALB.  Increase fluid intake at home.

## 2021-12-10 NOTE — Assessment & Plan Note (Signed)
Chronic, ongoing with no current pain clinic.  Multi factorial due to morbid obesity and co morbidities.  Would benefit from return to pain clinic, but does not wish to return at this time.  Agrees to return to different pain clinic in future if needed.  Would also benefit from PT time.  Tizanidine script continues and Cymbalta + Gabapentin.  Recommend use of Tylenol as needed + Lidocaine patches or Voltaren gel.  Apply heat to back as needed.

## 2021-12-10 NOTE — Assessment & Plan Note (Signed)
Noted on imaging, CT lung scans, since 2017, remaining stable in size at 3.3 cm.  Continue to monitor and consider referral if any changes.  CMP today.

## 2021-12-10 NOTE — Assessment & Plan Note (Signed)
Chronic, ongoing.  Continue current medication regimen and adjust as needed.  Buspar offering benefit -- recommend Melatonin at night for rest.

## 2021-12-10 NOTE — Assessment & Plan Note (Signed)
BMI 45.49 with HTN and COPD.  Recommended eating smaller high protein, low fat meals more frequently and exercising 30 mins a day 5 times a week with a goal of 10-15lb weight loss in the next 3 months. Patient voiced their understanding and motivation to adhere to these recommendations.  Tolerating Ozempic with 5 pounds loss thus far -- will increase dose to 1 MG weekly.  Educated her on this.  No history of thyroid cancer in patient or her family.  Return in 3 months for weight check.

## 2021-12-10 NOTE — Patient Instructions (Signed)
Healthy Eating °Following a healthy eating pattern may help you to achieve and maintain a healthy body weight, reduce the risk of chronic disease, and live a long and productive life. It is important to follow a healthy eating pattern at an appropriate calorie level for your body. Your nutritional needs should be met primarily through food by choosing a variety of nutrient-rich foods. °What are tips for following this plan? °Reading food labels °Read labels and choose the following: °Reduced or low sodium. °Juices with 100% fruit juice. °Foods with low saturated fats and high polyunsaturated and monounsaturated fats. °Foods with whole grains, such as whole wheat, cracked wheat, brown rice, and wild rice. °Whole grains that are fortified with folic acid. This is recommended for women who are pregnant or who want to become pregnant. °Read labels and avoid the following: °Foods with a lot of added sugars. These include foods that contain brown sugar, corn sweetener, corn syrup, dextrose, fructose, glucose, high-fructose corn syrup, honey, invert sugar, lactose, malt syrup, maltose, molasses, raw sugar, sucrose, trehalose, or turbinado sugar. °Do not eat more than the following amounts of added sugar per day: °6 teaspoons (25 g) for women. °9 teaspoons (38 g) for men. °Foods that contain processed or refined starches and grains. °Refined grain products, such as white flour, degermed cornmeal, white bread, and white rice. °Shopping °Choose nutrient-rich snacks, such as vegetables, whole fruits, and nuts. Avoid high-calorie and high-sugar snacks, such as potato chips, fruit snacks, and candy. °Use oil-based dressings and spreads on foods instead of solid fats such as butter, stick margarine, or cream cheese. °Limit pre-made sauces, mixes, and "instant" products such as flavored rice, instant noodles, and ready-made pasta. °Try more plant-protein sources, such as tofu, tempeh, black beans, edamame, lentils, nuts, and  seeds. °Explore eating plans such as the Mediterranean diet or vegetarian diet. °Cooking °Use oil to sauté or stir-fry foods instead of solid fats such as butter, stick margarine, or lard. °Try baking, boiling, grilling, or broiling instead of frying. °Remove the fatty part of meats before cooking. °Steam vegetables in water or broth. °Meal planning ° °At meals, imagine dividing your plate into fourths: °One-half of your plate is fruits and vegetables. °One-fourth of your plate is whole grains. °One-fourth of your plate is protein, especially lean meats, poultry, eggs, tofu, beans, or nuts. °Include low-fat dairy as part of your daily diet. °Lifestyle °Choose healthy options in all settings, including home, work, school, restaurants, or stores. °Prepare your food safely: °Wash your hands after handling raw meats. °Keep food preparation surfaces clean by regularly washing with hot, soapy water. °Keep raw meats separate from ready-to-eat foods, such as fruits and vegetables. °Cook seafood, meat, poultry, and eggs to the recommended internal temperature. °Store foods at safe temperatures. In general: °Keep cold foods at 40°F (4.4°C) or below. °Keep hot foods at 140°F (60°C) or above. °Keep your freezer at 0°F (-17.8°C) or below. °Foods are no longer safe to eat when they have been between the temperatures of 40°-140°F (4.4-60°C) for more than 2 hours. °What foods should I eat? °Fruits °Aim to eat 2 cup-equivalents of fresh, canned (in natural juice), or frozen fruits each day. Examples of 1 cup-equivalent of fruit include 1 small apple, 8 large strawberries, 1 cup canned fruit, ½ cup dried fruit, or 1 cup 100% juice. °Vegetables °Aim to eat 2½-3 cup-equivalents of fresh and frozen vegetables each day, including different varieties and colors. Examples of 1 cup-equivalent of vegetables include 2 medium carrots, 2 cups raw,   leafy greens, 1 cup chopped vegetable (raw or cooked), or 1 medium baked potato. °Grains °Aim to  eat 6 ounce-equivalents of whole grains each day. Examples of 1 ounce-equivalent of grains include 1 slice of bread, 1 cup ready-to-eat cereal, 3 cups popcorn, or ½ cup cooked rice, pasta, or cereal. °Meats and other proteins °Aim to eat 5-6 ounce-equivalents of protein each day. Examples of 1 ounce-equivalent of protein include 1 egg, 1/2 cup nuts or seeds, or 1 tablespoon (16 g) peanut butter. A cut of meat or fish that is the size of a deck of cards is about 3-4 ounce-equivalents. °Of the protein you eat each week, try to have at least 8 ounces come from seafood. This includes salmon, trout, herring, and anchovies. °Dairy °Aim to eat 3 cup-equivalents of fat-free or low-fat dairy each day. Examples of 1 cup-equivalent of dairy include 1 cup (240 mL) milk, 8 ounces (250 g) yogurt, 1½ ounces (44 g) natural cheese, or 1 cup (240 mL) fortified soy milk. °Fats and oils °Aim for about 5 teaspoons (21 g) per day. Choose monounsaturated fats, such as canola and olive oils, avocados, peanut butter, and most nuts, or polyunsaturated fats, such as sunflower, corn, and soybean oils, walnuts, pine nuts, sesame seeds, sunflower seeds, and flaxseed. °Beverages °Aim for six 8-oz glasses of water per day. Limit coffee to three to five 8-oz cups per day. °Limit caffeinated beverages that have added calories, such as soda and energy drinks. °Limit alcohol intake to no more than 1 drink a day for nonpregnant women and 2 drinks a day for men. One drink equals 12 oz of beer (355 mL), 5 oz of wine (148 mL), or 1½ oz of hard liquor (44 mL). °Seasoning and other foods °Avoid adding excess amounts of salt to your foods. Try flavoring foods with herbs and spices instead of salt. °Avoid adding sugar to foods. °Try using oil-based dressings, sauces, and spreads instead of solid fats. °This information is based on general [place] nutrition guidelines. For more information, visit choosemyplate.gov. Exact amounts may vary based on your nutrition  needs. °Summary °A healthy eating plan may help you to maintain a healthy weight, reduce the risk of chronic diseases, and stay active throughout your life. °Plan your meals. Make sure you eat the right portions of a variety of nutrient-rich foods. °Try baking, boiling, grilling, or broiling instead of frying. °Choose healthy options in all settings, including home, work, school, restaurants, or stores. °This information is not intended to replace advice given to you by your health care provider. Make sure you discuss any questions you have with your health care provider. °Document Revised: 07/15/2021 Document Reviewed: 07/15/2021 °Elsevier Patient Education © 2022 [employer] ° °

## 2021-12-10 NOTE — Assessment & Plan Note (Signed)
Chronic, stable with Omeprazole which offers her benefit.  Continue current medication regimen and check Mag level annually -- due next October 2023.  Refills up to date.

## 2021-12-10 NOTE — Progress Notes (Signed)
BP 107/64    Pulse 86    Temp 99.5 F (37.5 C)    Resp 16    Ht '5\' 3"'  (1.6 m)    Wt 256 lb 12.8 oz (116.5 kg)    LMP  (LMP Unknown)    SpO2 94%    BMI 45.49 kg/m    Subjective:    Patient ID: [name], female    DOB: 1956/06/05, 66 y.o.   MRN: [id number]  HPI: [name] is a 66 y.o. female  Chief Complaint  Patient presents with   Weight Check   Hyperlipidemia   Hypertension   IFG   Chronic Kidney Disease   HYPERTENSION / HYPERLIPIDEMIA Continues on Telmisartan-HCTZ 40-12.5, Lasix, K+, and Lipitor -- increased to 80 MG last visit.   Was on Metformin for weight loss in past, but we changed this to Ozempic in October 2022.  A1C over past years 5.7 to 5.9 -- last A1C 5.5% -- she has lost 5 lbs on Ozempic, but has hit a stand still.  For ventral hernia repair she has been told in past she needs to lose weight.  She currently has CPAP and uses this nightly. Satisfied with current treatment? yes Duration of hypertension: chronic BP monitoring frequency: not checking BP range:  BP medication side effects: no Duration of hyperlipidemia: chronic Cholesterol medication side effects: no Cholesterol supplements: none Medication compliance: good compliance Aspirin: yes Recent stressors: no Recurrent headaches: no Visual changes: no Palpitations: no Dyspnea: no Chest pain: no Lower extremity edema: no Dizzy/lightheaded: no   CHRONIC KIDNEY DISEASE Recent labs = eGFR 55. CKD status: stable Medications renally dose: yes Previous renal evaluation: no Pneumovax:  Up to Date Influenza Vaccine:  Up to Date  COPD Last lung screening was in November 2022. Continues on Spiriva.  Continues to smoke 1 PPD and is not interested in quitting.    On lung CT screening noted -- "stable 3.3 cm right adrenal nodule with density 13" - has been noted on imaging and stable since 2017.  Saw oncology for abnormal labs, saw last 05/19/21 -- stable will follow-up with them as needed and  to check CBC Q6MOS if consistent increase in WBC then can refer back for possible biopsy. COPD status: stable Satisfied with current treatment?: yes Oxygen use: no Dyspnea frequency: occasional, at baseline Cough frequency: occasional Rescue inhaler frequency: very rare use Limitation of activity: no Productive cough: none Last Spirometry: none recent Pneumovax: Up to Date Influenza: Up to Date  DEPRESSION Continues on Cymbalta, Buspar 20 MG TID, Vistaril.    Takes Cymbalta for chronic back pain, lower back.  Has seen pain management in past at [facility], but did not have success per her report.  Also takes Tizanidine for pain + Gabapentin 800 MG TID.  Mood status: stable Satisfied with current treatment?: yes Symptom severity: moderate  Duration of current treatment : chronic Side effects: no Medication compliance: good compliance Psychotherapy/counseling: none Depressed mood: sometimes Anxious mood: no Anhedonia: no Significant weight loss or gain: no Insomnia: yes hard to stay asleep Fatigue: no Feelings of worthlessness or guilt: no Impaired concentration/indecisiveness: no Suicidal ideations: no Hopelessness: no Crying spells: no Depression screen [facility] 2/9 12/10/2021 09/03/2021 03/04/2021 09/03/2020 03/29/2020  Decreased Interest 0 0 0 0 0  Down, Depressed, Hopeless 0 0 0 0 0  PHQ - 2 Score 0 0 0 0 0  Altered sleeping 0 0 0 0 1  Tired, decreased energy 0 0 0 0 0  Change in appetite 0 0 0 0 0  Feeling bad or failure about yourself  0 0 0 0 0  Trouble concentrating 0 0 0 0 0  Moving slowly or fidgety/restless 0 0 0 0 0  Suicidal thoughts 0 0 0 0 0  PHQ-9 Score 0 0 0 0 1  Difficult doing work/chores - Not difficult at all - Not difficult at all Not difficult at all  Some recent data might be hidden   GERD Continues on Omeprazole and reports good control. GERD control status: stable  Satisfied with current treatment? yes Heartburn frequency: none Medication side effects:  no  Medication compliance: stable Dysphagia: no Odynophagia:  no Hematemesis: no Blood in stool: no EGD: no  Relevant past medical, surgical, family and social history reviewed and updated as indicated. Interim medical history since our last visit reviewed. Allergies and medications reviewed and updated.  Review of Systems  Constitutional:  Negative for activity change, appetite change, diaphoresis, fatigue and fever.  Respiratory:  Negative for cough, chest tightness and shortness of breath.   Cardiovascular:  Negative for chest pain, palpitations and leg swelling.  Gastrointestinal:  Negative for abdominal distention, abdominal pain, constipation, diarrhea, nausea and vomiting.  Endocrine: Negative for cold intolerance, heat intolerance, polydipsia, polyphagia and polyuria.  Neurological:  Negative for dizziness, syncope, weakness, light-headedness, numbness and headaches.  Psychiatric/Behavioral: Negative.     Per HPI unless specifically indicated above     Objective:    BP 107/64    Pulse 86    Temp 99.5 F (37.5 C)    Resp 16    Ht '5\' 3"'  (1.6 m)    Wt 256 lb 12.8 oz (116.5 kg)    LMP  (LMP Unknown)    SpO2 94%    BMI 45.49 kg/m   Wt Readings from Last 3 Encounters:  12/10/21 256 lb 12.8 oz (116.5 kg)  10/21/21 256 lb (116.1 kg)  10/15/21 261 lb 9.6 oz (118.7 kg)    Physical Exam Vitals and nursing note reviewed.  Constitutional:      General: She is awake. She is not in acute distress.    Appearance: She is well-developed. She is obese. She is not ill-appearing.  HENT:     Head: Normocephalic.     Right Ear: Hearing normal.     Left Ear: Hearing normal.  Eyes:     General: Lids are normal.        Right eye: No discharge.        Left eye: No discharge.     Conjunctiva/sclera: Conjunctivae normal.     Pupils: Pupils are equal, round, and reactive to light.  Neck:     Vascular: No carotid bruit.  Cardiovascular:     Rate and Rhythm: Normal rate and regular rhythm.      Heart sounds: Normal heart sounds. No murmur heard.   No gallop.  Pulmonary:     Effort: Pulmonary effort is normal. No accessory muscle usage or respiratory distress.     Breath sounds: Decreased breath sounds and wheezing present.     Comments: Diminished throughout per baseline with occasional expiratory wheezes. Abdominal:     General: Bowel sounds are normal.     Palpations: Abdomen is soft.  Musculoskeletal:     Cervical back: Normal range of motion and neck supple.     Right lower leg: No edema.     Left lower leg: No edema.  Skin:    General: Skin is warm  and dry.  Neurological:     Mental Status: She is alert and oriented to person, place, and time.  Psychiatric:        Attention and Perception: Attention normal.        Mood and Affect: Mood normal.        Speech: Speech normal.        Behavior: Behavior normal. Behavior is cooperative.        Thought Content: Thought content normal.    Results for orders placed or performed in visit on 09/03/21  Bayer DCA Hb A1c Waived  Result Value Ref Range   HB A1C (BAYER DCA - WAIVED) 5.5 4.8 - 5.6 %  Comprehensive metabolic panel  Result Value Ref Range   Glucose 108 (H) 70 - 99 mg/dL   BUN 17 8 - 27 mg/dL   Creatinine, Ser 1.11 (H) 0.57 - 1.00 mg/dL   eGFR 55 (L) >59 mL/min/1.73   BUN/Creatinine Ratio 15 12 - 28   Sodium 141 134 - 144 mmol/L   Potassium 5.0 3.5 - 5.2 mmol/L   Chloride 103 96 - 106 mmol/L   CO2 22 20 - 29 mmol/L   Calcium 10.2 8.7 - 10.3 mg/dL   Total Protein 6.9 6.0 - 8.5 g/dL   Albumin 4.0 3.8 - 4.8 g/dL   Globulin, Total 2.9 1.5 - 4.5 g/dL   Albumin/Globulin Ratio 1.4 1.2 - 2.2   Bilirubin Total <0.2 0.0 - 1.2 mg/dL   Alkaline Phosphatase 122 (H) 44 - 121 IU/L   AST 24 0 - 40 IU/L   ALT 17 0 - 32 IU/L  TSH  Result Value Ref Range   TSH 2.560 0.450 - 4.500 uIU/mL  Magnesium  Result Value Ref Range   Magnesium 1.9 1.6 - 2.3 mg/dL  Lipid Panel w/o Chol/HDL Ratio  Result Value Ref Range    Cholesterol, Total 179 100 - 199 mg/dL   Triglycerides 160 (H) 0 - 149 mg/dL   HDL 50 >39 mg/dL   VLDL Cholesterol Cal 28 5 - 40 mg/dL   LDL Chol Calc (NIH) 101 (H) 0 - 99 mg/dL  CBC with Differential/Platelet  Result Value Ref Range   WBC 12.2 (H) 3.4 - 10.8 x10E3/uL   RBC 4.45 3.77 - 5.28 x10E6/uL   Hemoglobin 12.2 11.1 - 15.9 g/dL   Hematocrit 38.3 34.0 - 46.6 %   MCV 86 79 - 97 fL   MCH 27.4 26.6 - 33.0 pg   MCHC 31.9 31.5 - 35.7 g/dL   RDW 14.1 11.7 - 15.4 %   Platelets 328 150 - 450 x10E3/uL   Neutrophils 51 Not Estab. %   Lymphs 35 Not Estab. %   Monocytes 11 Not Estab. %   Eos 2 Not Estab. %   Basos 1 Not Estab. %   Neutrophils Absolute 6.1 1.4 - 7.0 x10E3/uL   Lymphocytes Absolute 4.3 (H) 0.7 - 3.1 x10E3/uL   Monocytes Absolute 1.4 (H) 0.1 - 0.9 x10E3/uL   EOS (ABSOLUTE) 0.3 0.0 - 0.4 x10E3/uL   Basophils Absolute 0.1 0.0 - 0.2 x10E3/uL   Immature Granulocytes 0 Not Estab. %   Immature Grans (Abs) 0.0 0.0 - 0.1 x10E3/uL      Assessment & Plan:   Problem List Items Addressed This Visit       Cardiovascular and Mediastinum   Aortic atherosclerosis (HCC)    Chronic, ongoing, noted on lung CT 10/02/2019.  Recommend she continue ASA and statin daily for prevention.  Recommend  complete cessation of smoking.      Benign hypertension    Chronic, ongoing.  BP well below goal today.  Continue current medication regimen and adjust as needed.  Recommend she monitor BP at home daily and document for provider + focus on DASH diet.  Obtain CMP and urine ALB today.  Refills up to date.  If remains on lower side BP level next visit will reduce medications.  Return to office in 6 months.      Relevant Orders   Microalbumin, Urine Waived   Comprehensive metabolic panel     Respiratory   Centrilobular emphysema (HCC)    Chronic, ongoing.  Continue current medication regimen and adjust as needed.  Not O2 dependent at this time.  Continue to recommend complete smoking cessation.  Spirometry at next visit.  Consider referral to pulmonary if worsening.      Relevant Medications   loratadine (CLARITIN) 10 MG tablet   Tiotropium Bromide Monohydrate (SPIRIVA RESPIMAT) 2.5 MCG/ACT AERS   Sleep apnea    Chronic, ongoing -- continue 100% use of this.  Received new equipment.        Digestive   Gastroesophageal reflux disease    Chronic, stable with Omeprazole which offers her benefit.  Continue current medication regimen and check Mag level annually -- due next October 2023.  Refills up to date.        Endocrine   IFG (impaired fasting glucose)    Ongoing with A1c ranging from 5.7 to 5.9%, recent 5.5%.  Continue Ozempic, but increase to 1 MG weekly to further assist with weight loss.  Educated her on this.  No history of thyroid cancer in patient or her family.  Return in 3 months.  Check A1c and urine ALB today.      Relevant Orders   Bayer DCA Hb A1c Waived   Microalbumin, Urine Waived     Genitourinary   Chronic kidney disease, stage 3a (HCC)    GFR in the 50's recent visits, recent was 69.  Recheck today.  Reminded to avoid NSAIDs.  May need to adjust Gabapentin based on labs today, will review and determine need to reduce -- discussed with patient.  Labs today: CMP and urine ALB.  Increase fluid intake at home.      Relevant Orders   Microalbumin, Urine Waived   Comprehensive metabolic panel     Other   Anxiety    Chronic, ongoing.  Continue current medication regimen and adjust as needed.  Buspar offering benefit -- recommend Melatonin at night for rest.      Chronic pain    Chronic, ongoing with no current pain clinic.  Multi factorial due to morbid obesity and co morbidities.  Would benefit from return to pain clinic, but does not wish to return at this time.  Agrees to return to different pain clinic in future if needed.  Would also benefit from PT time.  Tizanidine script continues and Cymbalta + Gabapentin.  Recommend use of Tylenol as needed +  Lidocaine patches or Voltaren gel.  Apply heat to back as needed.        Depression, major, single episode, in partial remission (HCC)    Chronic, ongoing.  Continue current medication regimen and adjust as needed. Duloxetine offers benefit to pain and mood.  Refills sent as needed.  Recommend adding Melatonin.  Denies SI/HI.  Return in 6 months.      Hyperlipidemia    Chronic, ongoing.  Continue statin and  adjust as needed -- recent increase to Atorvastatin 80 MG.  Labs today.      Relevant Orders   Comprehensive metabolic panel   Lipid Panel w/o Chol/HDL Ratio   Mass of right adrenal gland ([place])    Noted on imaging, CT lung scans, since 2017, remaining stable in size at 3.3 cm.  Continue to monitor and consider referral if any changes.  CMP today.      Monocytosis    Was followed by oncology, now to return as needed if any consistent elevations noted.  To check CBC Q6MOS -- will recheck in April and then every 6 months = April and October.      Morbid obesity ([place]) - Primary    BMI 45.49 with HTN and COPD.  Recommended eating smaller high protein, low fat meals more frequently and exercising 30 mins a day 5 times a week with a goal of 10-15lb weight loss in the next 3 months. Patient voiced their understanding and motivation to adhere to these recommendations.  Tolerating Ozempic with 5 pounds loss thus far -- will increase dose to 1 MG weekly.  Educated her on this.  No history of thyroid cancer in patient or her family.  Return in 3 months for weight check.       Relevant Medications   Semaglutide, 1 MG/DOSE, 4 MG/3ML SOPN   Nicotine dependence, cigarettes, uncomplicated    I have recommended complete cessation of tobacco use. I have discussed various options available for assistance with tobacco cessation including over the counter methods (Nicotine gum, patch and lozenges). We also discussed prescription options (Chantix, Nicotine Inhaler / Nasal Spray). The patient is not  interested in pursuing any prescription tobacco cessation options at this time.  Continue annual lung screening.       Other Visit Diagnoses     Postmenopausal estrogen deficiency       DEXA scan ordered today and discussed with patient.   Relevant Orders   DG Bone Density   Encounter for screening mammogram for malignant neoplasm of breast       Mammogram ordered   Relevant Orders   MM 3D SCREEN BREAST BILATERAL        Follow up plan: Return in about 3 months (around 03/10/2022) for WEIGHT CHECK WITH OZEMPIC 1 MG.

## 2021-12-10 NOTE — Assessment & Plan Note (Signed)
Was followed by oncology, now to return as needed if any consistent elevations noted.  To check CBC Q6MOS -- will recheck in April and then every 6 months = April and October.

## 2021-12-10 NOTE — Assessment & Plan Note (Signed)
Chronic, ongoing.  BP well below goal today.  Continue current medication regimen and adjust as needed.  Recommend she monitor BP at home daily and document for provider + focus on DASH diet.  Obtain CMP and urine ALB today.  Refills up to date.  If remains on lower side BP level next visit will reduce medications.  Return to office in 6 months.

## 2021-12-10 NOTE — Assessment & Plan Note (Addendum)
Ongoing with A1c ranging from 5.7 to 5.9%, recent 5.5%.  Continue Ozempic, but increase to 1 MG weekly to further assist with weight loss.  Educated her on this.  No history of thyroid cancer in patient or her family.  Return in 3 months.  Check A1c and urine ALB today.

## 2021-12-10 NOTE — Assessment & Plan Note (Signed)
I have recommended complete cessation of tobacco use. I have discussed various options available for assistance with tobacco cessation including over the counter methods (Nicotine gum, patch and lozenges). We also discussed prescription options (Chantix, Nicotine Inhaler / Nasal Spray). The patient is not interested in pursuing any prescription tobacco cessation options at this time.  Continue annual lung screening. 

## 2021-12-11 ENCOUNTER — Ambulatory Visit (INDEPENDENT_AMBULATORY_CARE_PROVIDER_SITE_OTHER): Payer: PPO | Admitting: *Deleted

## 2021-12-11 DIAGNOSIS — Z0000 Encounter for general adult medical examination without abnormal findings: Secondary | ICD-10-CM

## 2021-12-11 LAB — COMPREHENSIVE METABOLIC PANEL
ALT: 18 IU/L (ref 0–32)
AST: 26 IU/L (ref 0–40)
Albumin/Globulin Ratio: 1.4 (ref 1.2–2.2)
Albumin: 3.9 g/dL (ref 3.8–4.8)
Alkaline Phosphatase: 156 IU/L — ABNORMAL HIGH (ref 44–121)
BUN/Creatinine Ratio: 13 (ref 12–28)
BUN: 16 mg/dL (ref 8–27)
Bilirubin Total: 0.2 mg/dL (ref 0.0–1.2)
CO2: 25 mmol/L (ref 20–29)
Calcium: 9.3 mg/dL (ref 8.7–10.3)
Chloride: 104 mmol/L (ref 96–106)
Creatinine, Ser: 1.19 mg/dL — ABNORMAL HIGH (ref 0.57–1.00)
Globulin, Total: 2.8 g/dL (ref 1.5–4.5)
Glucose: 109 mg/dL — ABNORMAL HIGH (ref 70–99)
Potassium: 4.9 mmol/L (ref 3.5–5.2)
Sodium: 140 mmol/L (ref 134–144)
Total Protein: 6.7 g/dL (ref 6.0–8.5)
eGFR: 51 mL/min/{1.73_m2} — ABNORMAL LOW (ref >59)

## 2021-12-11 LAB — LIPID PANEL W/O CHOL/HDL RATIO
Cholesterol, Total: 150 mg/dL (ref 100–199)
HDL: 47 mg/dL (ref >39)
LDL Chol Calc (NIH): 78 mg/dL (ref 0–99)
Triglycerides: 142 mg/dL (ref 0–149)
VLDL Cholesterol Cal: 25 mg/dL (ref 5–40)

## 2021-12-11 NOTE — Progress Notes (Signed)
Contacted via MyChart Estimated Creatinine Clearance: 58 mL/min (A) (by C-G formula based on SCr of 1.19 mg/dL (H)).  Good evening Deem, your labs have returned: - Cholesterol levels improved with Atorvastatin 80 MG, continue this. - Kidney function continues to show stage 3 kidney disease, but no worsening.  However, based on the calculation of your creatinine clearance, which we use for dosing some medications like Gabapentin - your total daily dose of Gabapentin should be 1800 MG -- currently you are taking a total of 2400 MG daily, this is 600 MG over what you should be taking.  There are two things we could try, we can try reducing Gabapentin to 600 MG three times a day and see how you do OR we could change completely to Lyrica (which is like Gabapentin) where we may have a little more wiggle room to adjust.  What are your thoughts on this?  Let me know.  Any questions? Keep being awesome!!  Thank you for allowing me to participate in your care.  I appreciate you. Kindest regards, Deklyn Gibbon

## 2021-12-11 NOTE — Progress Notes (Signed)
Subjective:   KEELEE Garner is a 66 y.o. female who presents for Medicare Annual (Subsequent) preventive examination.I connected with  DELORICE BANNISTER on 12/11/21 by a telephone enabled telemedicine application and verified that I am speaking with the correct person using two identifiers.   I discussed the limitations of evaluation and management by telemedicine. The patient expressed understanding and agreed to proceed.  Patient location: home  Provider location: Tele-Health  not in office    Review of Systems     Cardiac Risk Factors include: advanced age (>34men, >51 women);obesity (BMI >30kg/m2);sedentary lifestyle;hypertension;smoking/ tobacco exposure     Objective:    Today's Vitals   12/11/21 1203  PainSc: 6    There is no height or weight on file to calculate BMI.  Advanced Directives 12/11/2021 05/19/2021 10/29/2020 01/05/2018 07/20/2016 06/18/2016 06/10/2016  Does Patient Have a Medical Advance Directive? No No No No No No No  Would patient like information on creating a medical advance directive? No - Patient declined No - Patient declined - No - Patient declined No - patient declined information No - patient declined information No - patient declined information    Current Medications (verified) Outpatient Encounter Medications as of 12/11/2021  Medication Sig   acetaminophen (TYLENOL) 500 MG tablet Take 1,000 mg by mouth in the morning and at bedtime.   albuterol (PROAIR HFA) 108 (90 Base) MCG/ACT inhaler Inhale 2 puffs into the lungs every 6 (six) hours as needed for wheezing or shortness of breath.   ascorbic acid (VITAMIN C) 500 MG tablet Take 500 mg by mouth daily.   aspirin 81 MG tablet Take 81 mg by mouth daily.   atorvastatin (LIPITOR) 80 MG tablet Take 1 tablet (80 mg total) by mouth daily.   busPIRone (BUSPAR) 10 MG tablet Take 2 tablets (20 mg total) by mouth 3 (three) times daily.   Calcium Citrate-Vitamin D (CALCIUM + D PO) Take 1,000 mg by mouth daily.    DULoxetine (CYMBALTA) 60 MG capsule Take 1 capsule (60 mg total) by mouth daily.   furosemide (LASIX) 20 MG tablet Take 1 tablet (20 mg total) by mouth daily.   gabapentin (NEURONTIN) 800 MG tablet Take 1 tablet (800 mg total) by mouth 3 (three) times daily.   hydrOXYzine (ATARAX/VISTARIL) 10 MG tablet Take 1 tablet (10 mg total) by mouth 3 (three) times daily as needed.   loratadine (CLARITIN) 10 MG tablet Take 10 mg by mouth daily.   Melatonin 10 MG TABS Take 1 tablet by mouth at bedtime.   Multiple Vitamins-Minerals (MULTIVITAMIN WITH MINERALS) tablet Take 1 tablet by mouth daily.   omeprazole (PRILOSEC) 40 MG capsule Take 1 capsule (40 mg total) by mouth daily.   potassium chloride SA (KLOR-CON) 20 MEQ tablet Take 1 tablet (20 mEq total) by mouth daily.   Semaglutide, 1 MG/DOSE, 4 MG/3ML SOPN Inject 1 mg as directed once a week.   telmisartan-hydrochlorothiazide (MICARDIS HCT) 40-12.5 MG tablet Take 1 tablet by mouth daily.   Tiotropium Bromide Monohydrate (SPIRIVA RESPIMAT) 2.5 MCG/ACT AERS INHALE 2 SPRAY(S) BY MOUTH ONCE DAILY   tiZANidine (ZANAFLEX) 4 MG capsule Take 1 capsule (4 mg total) by mouth 3 (three) times daily.   triamcinolone cream (KENALOG) 0.1 % Apply 1 application topically 2 (two) times daily.   vitamin B-12 (CYANOCOBALAMIN) 1000 MCG tablet Take 1,000 mcg by mouth daily.   No facility-administered encounter medications on file as of 12/11/2021.    Allergies (verified) Patient has no known allergies.  History: Past Medical History:  Diagnosis Date   Arthritis    COPD (chronic obstructive pulmonary disease) (HCC)    Enlarged heart    GERD (gastroesophageal reflux disease)    Hiatal hernia    Hx of degenerative disc disease    Hyperlipidemia    Hypertension    Mass    On adrenal gland   Monocytosis    Osteoporosis    Sleep apnea    Past Surgical History:  Procedure Laterality Date   ABDOMINAL HYSTERECTOMY     ACHILLES TENDON REPAIR     Removed bone  spur and repaired achilles tendon   APPENDECTOMY     bone fusion in neck     COLONOSCOPY WITH PROPOFOL N/A 01/17/2019   Procedure: COLONOSCOPY WITH PROPOFOL;  Surgeon: Jonathon Bellows, MD;  Location: [facility] ENDOSCOPY;  Service: Gastroenterology;  Laterality: N/A;   HERNIA REPAIR     has not had repair   TONSILLECTOMY     Family History  Problem Relation Age of Onset   COPD Father    Heart disease Father    Hyperlipidemia Father    Hypertension Father    Dementia Father    Cancer Sister    Hyperlipidemia Sister    Hypertension Sister    Lymphoma Sister    Migraines Son    Stroke Maternal Grandfather    Heart attack Brother    Cancer Brother        colorectal and liver   Breast cancer Paternal Aunt    Breast cancer Paternal Aunt    Social History   Socioeconomic History   Marital status: Widowed    Spouse name: Not on file   Number of children: Not on file   Years of education: Not on file   Highest education level: Not on file  Occupational History   Occupation: disabled  Tobacco Use   Smoking status: Every Day    Packs/day: 1.00    Years: 48.00    Pack years: 48.00    Types: Cigarettes   Smokeless tobacco: Never   Tobacco comments:    pt states she plans to begin using patch to quit smoking  Vaping Use   Vaping Use: Never used  Substance and Sexual Activity   Alcohol use: No    Alcohol/week: 0.0 standard drinks   Drug use: No   Sexual activity: Never    Partners: Male  Other Topics Concern   Not on file  Social History Narrative   Not on file   Social Determinants of Health   Financial Resource Strain: Low Risk    Difficulty of Paying Living Expenses: Not very hard  Food Insecurity: No Food Insecurity   Worried About Running Out of Food in the Last Year: Never true   [place] in the Last Year: Never true  Transportation Needs: No Transportation Needs   Lack of Transportation (Medical): No   Lack of Transportation (Non-Medical): No  Physical  Activity: Inactive   Days of Exercise per Week: 0 days   Minutes of Exercise per Session: 0 min  Stress: No Stress Concern Present   Feeling of Stress : Only a little  Social Connections: Socially Isolated   Frequency of Communication with Friends and Family: More than three times a week   Frequency of Social Gatherings with Friends and Family: More than three times a week   Attends Religious Services: Never   Marine scientist or Organizations: No   Attends Club or  Organization Meetings: Never   Marital Status: Widowed    Tobacco Counseling Ready to quit: Not Answered Counseling given: Not Answered Tobacco comments: pt states she plans to begin using patch to quit smoking   Clinical Intake:     Pain : 0-10 Pain Score: 6  Pain Type: Chronic pain Pain Location: Back (hip knee) Pain Descriptors / Indicators: Constant, Burning, Aching Pain Onset: More than a month ago Pain Frequency: Constant Pain Relieving Factors: tylenol  Pain Relieving Factors: tylenol  Nutritional Risks: None Diabetes: No  How often do you need to have someone help you when you read instructions, pamphlets, or other written materials from your doctor or pharmacy?: 1 - Never  Diabetic?  no  Interpreter Needed?: No  Information entered by :: Leroy Kennedy LPN   Activities of Daily Living In your present state of health, do you have any difficulty performing the following activities: 12/11/2021  Hearing? N  Vision? N  Difficulty concentrating or making decisions? N  Walking or climbing stairs? Y  Dressing or bathing? N  Doing errands, shopping? Y  Preparing Food and eating ? Y  Using the Toilet? N  In the past six months, have you accidently leaked urine? Y  Comment wears pads  Do you have problems with loss of bowel control? N  Managing your Medications? N  Managing your Finances? N  Housekeeping or managing your Housekeeping? Y  Some recent data might be hidden    Patient Care  Team: Venita Lick, NP as PCP - General (Nurse Practitioner) Sindy Guadeloupe, MD as Consulting Physician (Oncology) Rebekah Chesterfield, LCSW as Social Worker (Licensed Clinical Social Worker)  Indicate any recent [place] you may have received from other than [facility] providers in the past year (date may be approximate).     Assessment:   This is a routine wellness examination for Breyana.  Hearing/Vision screen Hearing Screening - Comments:: No trouble hearing Vision Screening - Comments:: Not up to date  Dietary issues and exercise activities discussed: Current Exercise Habits: The patient does not participate in regular exercise at present, Exercise limited by: orthopedic condition(s)   Goals Addressed             This Visit's Progress    Weight (lb) < 200 lb (90.7 kg)         Depression Screen PHQ 2/9 Scores 12/11/2021 12/10/2021 09/03/2021 03/04/2021 09/03/2020 03/29/2020 03/04/2020  PHQ - 2 Score 0 0 0 0 0 0 0  PHQ- 9 Score 0 0 0 0 0 1 1  Exception Documentation - - - - - - -    Fall Risk Fall Risk  12/10/2021 03/04/2021 04/05/2019 09/28/2018 05/18/2018  Falls in the past year? 0 1 0 No No  Comment - - - - -  Number falls in past yr: 0 0 0 - -  Injury with Fall? 0 0 0 - -  Risk for fall due to : No Fall Risks - - - -  Risk for fall due to: Comment - - - - -  Follow up Falls evaluation completed Falls evaluation completed - - -  Comment - - - - -    FALL RISK PREVENTION PERTAINING TO THE HOME:  Any stairs in or around the home? No  If so, are there any without handrails? No  Home free of loose throw rugs in walkways, pet beds, electrical cords, etc? Yes  Adequate lighting in your home to reduce risk of falls? Yes  ASSISTIVE DEVICES UTILIZED TO PREVENT FALLS:  Life alert? No  Use of a cane, walker or w/c? Yes  Grab bars in the bathroom? Yes  Shower chair or bench in shower? Yes  Elevated toilet seat or a handicapped toilet? Yes   TIMED UP AND GO:  Was the  test performed? No .    Cognitive Function:  Normal cognitive status assessed by direct observation by this Nurse Health Advisor. No abnormalities found.          Immunizations Immunization History  Administered Date(s) Administered   Fluad Quad(high Dose 65+) 09/03/2021   Influenza,inj,Quad PF,6+ Mos 10/28/2015, 08/25/2016, 09/07/2017, 09/28/2018, 09/04/2019, 09/03/2020   Influenza-Unspecified 08/27/2014   Moderna Sars-Covid-2 Vaccination 06/28/2020, 07/26/2020   Pneumococcal Conjugate-13 03/04/2021   Pneumococcal Polysaccharide-23 02/24/2019   Td 08/27/2014   Zoster, Live 12/20/2015    TDAP status: Up to date  Flu Vaccine status: Up to date  Pneumococcal vaccine status: Up to date  Covid-19 vaccine status: Information provided on how to obtain vaccines.   Qualifies for Shingles Vaccine? Yes   Zostavax completed Yes   Shingrix Completed?: No.    Education has been provided regarding the importance of this vaccine. Patient has been advised to call insurance company to determine out of pocket expense if they have not yet received this vaccine. Advised may also receive vaccine at local pharmacy or Health Dept. Verbalized acceptance and understanding.  Screening Tests Health Maintenance  Topic Date Due   COVID-19 Vaccine (3 - Booster for Moderna series) 12/26/2021 (Originally 09/20/2020)   DEXA SCAN  03/04/2022 (Originally 12/15/2020)   Zoster Vaccines- Shingrix (1 of 2) 03/10/2022 (Originally 12/15/2005)   MAMMOGRAM  12/24/2022   COLONOSCOPY (Pts 45-50yrs Insurance coverage will need to be confirmed)  01/18/2024   Pneumonia Vaccine 31+ Years old (3 - PPSV23 if available, else PCV20) 02/24/2024   TETANUS/TDAP  08/27/2024   INFLUENZA VACCINE  Completed   Hepatitis C Screening  Completed   HIV Screening  Completed   HPV VACCINES  Aged Out    Health Maintenance  There are no preventive care reminders to display for this patient.  Colorectal cancer screening: Type of  screening: Colonoscopy. Completed 2020. Repeat every 5 years  Mammogram -  Bone Density scheduled 01-26-2022    Lung Cancer Screening: (Low Dose CT Chest recommended if Age 16-80 years, 30 pack-year currently smoking OR have quit w/in 15years.)    Additional Screening:  Hepatitis C Screening: does not qualify; Completed 2016  Vision Screening: Recommended annual ophthalmology exams for early detection of glaucoma and other disorders of the eye. Is the patient up to date with their annual eye exam?  No  Who is the provider or what is the name of the office in which the patient attends annual eye exams?  If pt is not established with a provider, would they like to be referred to a provider to establish care? No .   Dental Screening: Recommended annual dental exams for proper oral hygiene  Community Resource Referral / Chronic Care Management: CRR required this visit?  No   CCM required this visit?  No      Plan:     I have personally reviewed and noted the following in the patients chart:   Medical and social history Use of alcohol, tobacco or illicit drugs  Current medications and supplements including opioid prescriptions.  Functional ability and status Nutritional status Physical activity Advanced directives List of other physicians Hospitalizations, surgeries, and ER visits in previous  12 months Vitals Screenings to include cognitive, depression, and falls Referrals and appointments  In addition, I have reviewed and discussed with patient certain preventive protocols, quality metrics, and best practice recommendations. A written personalized care plan for preventive services as well as general preventive health recommendations were provided to patient.     Leroy Kennedy, LPN   08/01/1114   Nurse Notes:

## 2021-12-11 NOTE — Patient Instructions (Signed)
[name] , Thank you for taking time to come for your Medicare Wellness Visit. I appreciate your ongoing commitment to your health goals. Please review the following plan we discussed and let me know if I can assist you in the future.   Screening recommendations/referrals: Colonoscopy: up to date Mammogram: scheduled  Bone Density: scheduled Recommended yearly ophthalmology/optometry visit for glaucoma screening and checkup Recommended yearly dental visit for hygiene and checkup  Vaccinations: Influenza vaccine: up to date Pneumococcal vaccine: up to date Tdap vaccine: up to date Shingles vaccine: Education provided    Advanced directives: Education provided  Conditions/risks identified:   Next appointment: 03-10-2022 [facility]   Preventive Care 66 Years and Older, Female Preventive care refers to lifestyle choices and visits with your health care provider that can promote health and wellness. What does preventive care include? A yearly physical exam. This is also called an annual well check. Dental exams once or twice a year. Routine eye exams. Ask your health care provider how often you should have your eyes checked. Personal lifestyle choices, including: Daily care of your teeth and gums. Regular physical activity. Eating a healthy diet. Avoiding tobacco and drug use. Limiting alcohol use. Practicing safe sex. Taking low-dose aspirin every day. Taking vitamin and mineral supplements as recommended by your health care provider. What happens during an annual well check? The services and screenings done by your health care provider during your annual well check will depend on your age, overall health, lifestyle risk factors, and family history of disease. Counseling  Your health care provider may ask you questions about your: Alcohol use. Tobacco use. Drug use. Emotional well-being. Home and relationship well-being. Sexual activity. Eating habits. History of  falls. Memory and ability to understand (cognition). Work and work Statistician. Reproductive health. Screening  You may have the following tests or measurements: Height, weight, and BMI. Blood pressure. Lipid and cholesterol levels. These may be checked every 5 years, or more frequently if you are over 41 years old. Skin check. Lung cancer screening. You may have this screening every year starting at age 66 if you have a 30-pack-year history of smoking and currently smoke or have quit within the past 15 years. Fecal occult blood test (FOBT) of the stool. You may have this test every year starting at age 55. Flexible sigmoidoscopy or colonoscopy. You may have a sigmoidoscopy every 5 years or a colonoscopy every 10 years starting at age 26. Hepatitis C blood test. Hepatitis B blood test. Sexually transmitted disease (STD) testing. Diabetes screening. This is done by checking your blood sugar (glucose) after you have not eaten for a while (fasting). You may have this done every 1-3 years. Bone density scan. This is done to screen for osteoporosis. You may have this done starting at age 79. Mammogram. This may be done every 1-2 years. Talk to your health care provider about how often you should have regular mammograms. Talk with your health care provider about your test results, treatment options, and if necessary, the need for more tests. Vaccines  Your health care provider may recommend certain vaccines, such as: Influenza vaccine. This is recommended every year. Tetanus, diphtheria, and acellular pertussis (Tdap, Td) vaccine. You may need a Td booster every 10 years. Zoster vaccine. You may need this after age 66. Pneumococcal 13-valent conjugate (PCV13) vaccine. One dose is recommended after age 41. Pneumococcal polysaccharide (PPSV23) vaccine. One dose is recommended after age 32. Talk to your health care provider about which screenings and vaccines  you need and how often you need  them. This information is not intended to replace advice given to you by your health care provider. Make sure you discuss any questions you have with your health care provider. Document Released: 12/13/2015 Document Revised: 08/05/2016 Document Reviewed: 09/17/2015 Elsevier Interactive Patient Education  2017 [place] Prevention in the Home Falls can cause injuries. They can happen to people of all ages. There are many things you can do to make your home safe and to help prevent falls. What can I do on the outside of my home? Regularly fix the edges of walkways and driveways and fix any cracks. Remove anything that might make you trip as you walk through a door, such as a raised step or threshold. Trim any bushes or trees on the path to your home. Use bright outdoor lighting. Clear any walking paths of anything that might make someone trip, such as rocks or tools. Regularly check to see if handrails are loose or broken. Make sure that both sides of any steps have handrails. Any raised decks and porches should have guardrails on the edges. Have any leaves, snow, or ice cleared regularly. Use sand or salt on walking paths during winter. Clean up any spills in your garage right away. This includes oil or grease spills. What can I do in the bathroom? Use night lights. Install grab bars by the toilet and in the tub and shower. Do not use towel bars as grab bars. Use non-skid mats or decals in the tub or shower. If you need to sit down in the shower, use a plastic, non-slip stool. Keep the floor dry. Clean up any water that spills on the floor as soon as it happens. Remove soap buildup in the tub or shower regularly. Attach bath mats securely with double-sided non-slip rug tape. Do not have throw rugs and other things on the floor that can make you trip. What can I do in the bedroom? Use night lights. Make sure that you have a light by your bed that is easy to reach. Do not use  any sheets or blankets that are too big for your bed. They should not hang down onto the floor. Have a firm chair that has side arms. You can use this for support while you get dressed. Do not have throw rugs and other things on the floor that can make you trip. What can I do in the kitchen? Clean up any spills right away. Avoid walking on wet floors. Keep items that you use a lot in easy-to-reach places. If you need to reach something above you, use a strong step stool that has a grab bar. Keep electrical cords out of the way. Do not use floor polish or wax that makes floors slippery. If you must use wax, use non-skid floor wax. Do not have throw rugs and other things on the floor that can make you trip. What can I do with my stairs? Do not leave any items on the stairs. Make sure that there are handrails on both sides of the stairs and use them. Fix handrails that are broken or loose. Make sure that handrails are as long as the stairways. Check any carpeting to make sure that it is firmly attached to the stairs. Fix any carpet that is loose or worn. Avoid having throw rugs at the top or bottom of the stairs. If you do have throw rugs, attach them to the floor with carpet tape. Make sure  that you have a light switch at the top of the stairs and the bottom of the stairs. If you do not have them, ask someone to add them for you. What else can I do to help prevent falls? Wear shoes that: Do not have high heels. Have rubber bottoms. Are comfortable and fit you well. Are closed at the toe. Do not wear sandals. If you use a stepladder: Make sure that it is fully opened. Do not climb a closed stepladder. Make sure that both sides of the stepladder are locked into place. Ask someone to hold it for you, if possible. Clearly mark and make sure that you can see: Any grab bars or handrails. First and last steps. Where the edge of each step is. Use tools that help you move around (mobility aids)  if they are needed. These include: Canes. Walkers. Scooters. Crutches. Turn on the lights when you go into a dark area. Replace any light bulbs as soon as they burn out. Set up your furniture so you have a clear path. Avoid moving your furniture around. If any of your floors are uneven, fix them. If there are any pets around you, be aware of where they are. Review your medicines with your doctor. Some medicines can make you feel dizzy. This can increase your chance of falling. Ask your doctor what other things that you can do to help prevent falls. This information is not intended to replace advice given to you by your health care provider. Make sure you discuss any questions you have with your health care provider. Document Released: 09/12/2009 Document Revised: 04/23/2016 Document Reviewed: 12/21/2014 Elsevier Interactive Patient Education  2017 [employer].

## 2021-12-14 ENCOUNTER — Encounter: Payer: Self-pay | Admitting: Nurse Practitioner

## 2021-12-15 ENCOUNTER — Telehealth: Payer: Self-pay | Admitting: Nurse Practitioner

## 2021-12-15 ENCOUNTER — Other Ambulatory Visit: Payer: Self-pay | Admitting: Nurse Practitioner

## 2021-12-15 MED ORDER — PREGABALIN 75 MG PO CAPS: ORAL_CAPSULE | ORAL | 12 refills | Status: DC

## 2021-12-15 MED ORDER — PREGABALIN 75 MG PO CAPS: ORAL_CAPSULE | ORAL | 1 refills | Status: DC

## 2021-12-15 NOTE — Telephone Encounter (Signed)
Pharmacy called in for assistance. They received Rx for pregabalin (LYRICA) 75 MG capsulewith 12 refills. Pharmacy says that this is a controlled medication and can only have 1 refill. Pharmacy is requesting to have a new Rx sent   Pharmacy: [facility] [address] [place] Phone:  [phone]  Fax:  [phone]

## 2021-12-15 NOTE — Addendum Note (Signed)
Addended by: Marnee Guarneri T on: 12/15/2021 12:23 PM   Modules accepted: Orders

## 2021-12-29 DIAGNOSIS — G4733 Obstructive sleep apnea (adult) (pediatric): Secondary | ICD-10-CM | POA: Diagnosis not present

## 2022-01-05 ENCOUNTER — Encounter: Payer: Self-pay | Admitting: Nurse Practitioner

## 2022-01-05 ENCOUNTER — Ambulatory Visit (INDEPENDENT_AMBULATORY_CARE_PROVIDER_SITE_OTHER): Payer: PPO | Admitting: Licensed Clinical Social Worker

## 2022-01-05 DIAGNOSIS — F324 Major depressive disorder, single episode, in partial remission: Secondary | ICD-10-CM

## 2022-01-05 DIAGNOSIS — G894 Chronic pain syndrome: Secondary | ICD-10-CM

## 2022-01-05 DIAGNOSIS — F419 Anxiety disorder, unspecified: Secondary | ICD-10-CM

## 2022-01-05 DIAGNOSIS — N1831 Chronic kidney disease, stage 3a: Secondary | ICD-10-CM

## 2022-01-06 MED ORDER — BACLOFEN 10 MG PO TABS: 5.0000 mg | ORAL_TABLET | Freq: Three times a day (TID) | ORAL | 4 refills | Status: DC

## 2022-01-06 NOTE — Patient Instructions (Signed)
Visit Information  Thank you for taking time to visit with me today. Please don't hesitate to contact me if I can be of assistance to you before our next scheduled telephone appointment.  Please call the care guide team at [phone] if you need to cancel or reschedule your appointment.   If you are experiencing a Mental Health or [place] or need someone to talk to, please call the [place] National Suicide Prevention Lifeline: 913 672 8047 or TTY: (712) 560-1829 TTY (506) 694-3776) to talk to a trained counselor call 911   Patient verbalizes understanding of instructions and care plan provided today and agrees to view in [place]. Active MyChart status confirmed with patient.    All care plan goals have been met. Will disconnect from care team after this encounter. Patient has been informed to contact the office if new needs arise.  Christa See, MSW, [email or web address] Phone [phone] 5:33 AM

## 2022-01-06 NOTE — Chronic Care Management (AMB) (Signed)
Chronic Care Management    Clinical Social Work Note  01/06/2022 Name: [name] MRN: [id number] DOB: 01-Jun-1956  [name] is a 66 y.o. year old female who is a primary care patient of Cannady, Barbaraann Faster, NP. The CCM team was consulted to assist the patient with chronic disease management and/or care coordination needs related to: Mental Health Counseling and Resources.   Engaged with patient by telephone for follow up visit in response to provider referral for social work chronic care management and care coordination services.   Consent to Services:  The patient was given information about Chronic Care Management services, agreed to services, and gave verbal consent prior to initiation of services.  Please see initial visit note for detailed documentation.   Patient agreed to services and consent obtained.   Summary:  Patient continues to maintain positive progress with care plan goals. She has completed all goals associated with CC LCSW .  See Care Plan below for interventions and patient self-care activities.  Recommendation: Patient may benefit from, and is in agreement with continuing to work with CCM team and PCP with management of health conditions.   Follow up Plan: All care plan goals have been met. Will disconnect from care team after this encounter. Patient has been informed to contact the office if new needs arise.    SDOH (Social Determinants of Health) assessments and interventions performed:    Advanced Directives Status: Not addressed in this encounter.  CCM Care Plan  No Known Allergies  Outpatient Encounter Medications as of 01/05/2022  Medication Sig   acetaminophen (TYLENOL) 500 MG tablet Take 1,000 mg by mouth in the morning and at bedtime.   albuterol (PROAIR HFA) 108 (90 Base) MCG/ACT inhaler Inhale 2 puffs into the lungs every 6 (six) hours as needed for wheezing or shortness of breath.   ascorbic acid (VITAMIN C) 500 MG tablet Take 500 mg by mouth  daily.   aspirin 81 MG tablet Take 81 mg by mouth daily.   atorvastatin (LIPITOR) 80 MG tablet Take 1 tablet (80 mg total) by mouth daily.   busPIRone (BUSPAR) 10 MG tablet Take 2 tablets (20 mg total) by mouth 3 (three) times daily.   Calcium Citrate-Vitamin D (CALCIUM + D PO) Take 1,000 mg by mouth daily.   DULoxetine (CYMBALTA) 60 MG capsule Take 1 capsule (60 mg total) by mouth daily.   furosemide (LASIX) 20 MG tablet Take 1 tablet (20 mg total) by mouth daily.   hydrOXYzine (ATARAX/VISTARIL) 10 MG tablet Take 1 tablet (10 mg total) by mouth 3 (three) times daily as needed.   loratadine (CLARITIN) 10 MG tablet Take 10 mg by mouth daily.   Melatonin 10 MG TABS Take 1 tablet by mouth at bedtime.   Multiple Vitamins-Minerals (MULTIVITAMIN WITH MINERALS) tablet Take 1 tablet by mouth daily.   omeprazole (PRILOSEC) 40 MG capsule Take 1 capsule (40 mg total) by mouth daily.   potassium chloride SA (KLOR-CON) 20 MEQ tablet Take 1 tablet (20 mEq total) by mouth daily.   pregabalin (LYRICA) 75 MG capsule Start out taking 75 MG (one tablet) by mouth once daily and if tolerating in one week may increase to 75 MG by mouth twice a day.   Semaglutide, 1 MG/DOSE, 4 MG/3ML SOPN Inject 1 mg as directed once a week.   telmisartan-hydrochlorothiazide (MICARDIS HCT) 40-12.5 MG tablet Take 1 tablet by mouth daily.   Tiotropium Bromide Monohydrate (SPIRIVA RESPIMAT) 2.5 MCG/ACT AERS INHALE 2 SPRAY(S) BY MOUTH  ONCE DAILY   tiZANidine (ZANAFLEX) 4 MG capsule Take 1 capsule (4 mg total) by mouth 3 (three) times daily.   triamcinolone cream (KENALOG) 0.1 % Apply 1 application topically 2 (two) times daily.   vitamin B-12 (CYANOCOBALAMIN) 1000 MCG tablet Take 1,000 mcg by mouth daily.   No facility-administered encounter medications on file as of 01/05/2022.    Patient Active Problem List   Diagnosis Date Noted   Mass of right adrenal gland ([place]) 03/04/2021   Monocytosis 02/28/2021   Chronic kidney disease, stage  3a ([place]) 03/04/2020   Aortic atherosclerosis ([place]) 10/03/2019   IFG (impaired fasting glucose) 09/04/2019   Vitamin D deficiency 12/29/2018   Osteoarthritis of right hip 12/27/2017   Advanced care planning/counseling discussion 06/07/2017   Benign hypertension 10/28/2015   Nicotine dependence, cigarettes, uncomplicated 88/50/2774   Facet syndrome, lumbar 07/11/2015   Spinal stenosis, lumbar region, with neurogenic claudication 07/11/2015   Status post cervical spinal fusion 07/11/2015   DJD (degenerative joint disease) of knee 07/11/2015   Chronic pain 05/17/2015   Anxiety 05/14/2015   Depression, major, single episode, in partial remission ([place]) 05/14/2015   DDD (degenerative disc disease), lumbar 05/14/2015   Centrilobular emphysema ([place]) 05/14/2015   Hyperlipidemia 05/14/2015   Gastroesophageal reflux disease 05/14/2015   Radiculopathy, lumbar region 05/14/2015   Sleep apnea 05/14/2015   Morbid obesity ([place]) 05/14/2015   Benign essential tremor 05/14/2015    Conditions to be addressed/monitored: HTN, CKD Stage 3a, Anxiety, and Depression  Care Plan : General Social Work (Adult)  Updates made by Rebekah Chesterfield, LCSW since 01/06/2022 12:00 AM     Problem: Quality of Life (General Plan of Care)      Long-Range Goal: Quality of Life Maintained Completed 01/05/2022  Start Date: 02/17/2021  This Visit's Progress: On track  Recent Progress: On track  Priority: Medium  Note:   Current Barriers:  Chronic Mental Health needs related to Panic Disorder and Depression Financial constraints related to managing health care expenses Limited social support ADL IADL limitations Mental Health Concerns  Social Isolation Inability to perform ADL's independently Inability to perform IADL's independently Suicidal Ideation/Homicidal Ideation: No Clinical Social Work Goal(s):  Over the next 120 days, patient will work with SW bi-monthly by telephone or in person to reduce or manage symptoms  related to anxiety and depression. Over the next 120 days, patient will demonstrate improved health management independence as evidenced by implementing healthy self-care into her daily routine such as: attending all medical appointments, deep breathing exercsies, taking time for self-reflection, grounding exercises to combat panic attacks, taking medications as prescribed, drinking water and daily exercise to improve mobility. Interventions: Patient interviewed and appropriate assessments performed Patient reports that her depression and anxiety symptoms are managed currently. She continues medication management. Patient reports, I'm having trouble with my left ear. The pain goes down to my jaw. It's been like this for several weeks and I'm in constant pain" Patient denies any trauma to the ear. Has difficulty sleeping because she sleeps on her left side. Patient takes Tylenol that assists with arthritis in hands only CCM LCSW discussed strategies to assist with pain management. Patient identified the use of electric blankets, gaming, and spending time with others helpful to promote mood and cope with stressors/pain Patient utilizes a walker to promote safety 11/18: Patient reports goal to walk more in the home to promote management of health conditions. Son assists with preparing her meals Patient reports that the Melatonin has continued to assist with  sleep hygiene. She takes meds daily Patient participates in medication management. Denies any adverse side effects Patient enjoys spending time with family and playing games on computer. Her sister visits daily CCM LCSW discussed strategies to assist with grief with the upcoming change in season/holidays, etc Patient was encouraged to establish a plan if triggered due to loss of spouse CCM LCSW discussed strategies to assist with making healthy changes. Patient was strongly encouraged to identify non-scale victories and identifying the WHY to assist with  maintaining motivation (surgeries and to increase independence) Patient is not interested in therapy Patient denies any resource needs Patient reports having a strong support system of family members. She shares that her son lives with her and he is disabled but he is able to assist her when needed. Patient's sister and brother also come daily to check on patient which provides patient ongoing socialization.  LCSW discussed coping skills for anxiety such a grounding exercises that she can use when panic arises LCSW used empathetic and active and reflective listening, validated patient's feelings/concerns, and provided emotional support. LCSW provided self-care education to help manage her multiple health conditions and improve her mood.  Discussed plans with patient for ongoing care management follow up and provided patient with direct contact information for care management team Patient continues to have insomnia and goes to bed at 4-5 am. Patient reports that she has been this way since her spouse passed 3 years ago Mindfulness or Relaxation Training, Active listening / Reflection utilized , Emotional Supportive Provided, and Verbalization of feelings encouraged  Emotional/Supportive Counseling provided during session Collaboration with PCP regarding development and update of comprehensive plan of care as evidenced by provider attestation and co-signature Inter-disciplinary care team collaboration (see longitudinal plan of care) Patient Self Care Activities:  Call provider office for new concerns or questions Attend all scheduled appointments with providers Continue compliance with medication management Continue utilizing healthy coping skills to assist with management of symptoms      Christa See, [email or web address] Phone [phone] 5:31 AM

## 2022-01-26 ENCOUNTER — Ambulatory Visit
Admission: RE | Admit: 2022-01-26 | Discharge: 2022-01-26 | Disposition: A | Discharge status: Home / Self Care | Payer: PPO | Source: Ambulatory Visit | Attending: Nurse Practitioner | Admitting: Nurse Practitioner

## 2022-01-26 ENCOUNTER — Other Ambulatory Visit: Payer: Self-pay

## 2022-01-26 DIAGNOSIS — M8589 Other specified disorders of bone density and structure, multiple sites: Secondary | ICD-10-CM | POA: Diagnosis not present

## 2022-01-26 DIAGNOSIS — Z1231 Encounter for screening mammogram for malignant neoplasm of breast: Secondary | ICD-10-CM | POA: Diagnosis not present

## 2022-01-26 DIAGNOSIS — Z780 Asymptomatic menopausal state: Secondary | ICD-10-CM | POA: Insufficient documentation

## 2022-01-26 NOTE — Progress Notes (Signed)
Contacted via MyChart   Your bone density shows thinning bones (osteopenia) but not brittle (osteoporosis). We recommend Vitamin D supplementation of about 2,0000 IUs of over the counter Vitamin D3. In addition, we recommend a diet high in calcium with dairy and dark green leafy vegetables. We would like you to get plenty of weight bearing exercises with walking and resistance training such as light weights or resistance bands available with instructions at places such as [employer].

## 2022-01-27 DIAGNOSIS — F324 Major depressive disorder, single episode, in partial remission: Secondary | ICD-10-CM

## 2022-01-27 DIAGNOSIS — G4733 Obstructive sleep apnea (adult) (pediatric): Secondary | ICD-10-CM | POA: Diagnosis not present

## 2022-01-27 DIAGNOSIS — N1831 Chronic kidney disease, stage 3a: Secondary | ICD-10-CM

## 2022-01-27 NOTE — Progress Notes (Signed)
Contacted via MyChart   Normal mammogram, may repeat in one year:)

## 2022-03-07 DIAGNOSIS — M85852 Other specified disorders of bone density and structure, left thigh: Secondary | ICD-10-CM | POA: Insufficient documentation

## 2022-03-07 NOTE — Patient Instructions (Signed)
Healthy Eating ?Following a healthy eating pattern may help you to achieve and maintain a healthy body weight, reduce the risk of chronic disease, and live a long and productive life. It is important to follow a healthy eating pattern at an appropriate calorie level for your body. Your nutritional needs should be met primarily through food by choosing a variety of nutrient-rich foods. ?What are tips for following this plan? ?Reading food labels ?Read labels and choose the following: ?Reduced or low sodium. ?Juices with 100% fruit juice. ?Foods with low saturated fats and high polyunsaturated and monounsaturated fats. ?Foods with whole grains, such as whole wheat, cracked wheat, brown rice, and wild rice. ?Whole grains that are fortified with folic acid. This is recommended for women who are pregnant or who want to become pregnant. ?Read labels and avoid the following: ?Foods with a lot of added sugars. These include foods that contain brown sugar, corn sweetener, corn syrup, dextrose, fructose, glucose, high-fructose corn syrup, honey, invert sugar, lactose, malt syrup, maltose, molasses, raw sugar, sucrose, trehalose, or turbinado sugar. ?Do not eat more than the following amounts of added sugar per day: ?6 teaspoons (25 g) for women. ?9 teaspoons (38 g) for men. ?Foods that contain processed or refined starches and grains. ?Refined grain products, such as white flour, degermed cornmeal, white bread, and white rice. ?Shopping ?Choose nutrient-rich snacks, such as vegetables, whole fruits, and nuts. Avoid high-calorie and high-sugar snacks, such as potato chips, fruit snacks, and candy. ?Use oil-based dressings and spreads on foods instead of solid fats such as butter, stick margarine, or cream cheese. ?Limit pre-made sauces, mixes, and "instant" products such as flavored rice, instant noodles, and ready-made pasta. ?Try more plant-protein sources, such as tofu, tempeh, black beans, edamame, lentils, nuts, and  seeds. ?Explore eating plans such as the Mediterranean diet or vegetarian diet. ?Cooking ?Use oil to saut? or stir-fry foods instead of solid fats such as butter, stick margarine, or lard. ?Try baking, boiling, grilling, or broiling instead of frying. ?Remove the fatty part of meats before cooking. ?Steam vegetables in water or broth. ?Meal planning ? ?At meals, imagine dividing your plate into fourths: ?One-half of your plate is fruits and vegetables. ?One-fourth of your plate is whole grains. ?One-fourth of your plate is protein, especially lean meats, poultry, eggs, tofu, beans, or nuts. ?Include low-fat dairy as part of your daily diet. ?Lifestyle ?Choose healthy options in all settings, including home, work, school, restaurants, or stores. ?Prepare your food safely: ?Wash your hands after handling raw meats. ?Keep food preparation surfaces clean by regularly washing with hot, soapy water. ?Keep raw meats separate from ready-to-eat foods, such as fruits and vegetables. ?Cook seafood, meat, poultry, and eggs to the recommended internal temperature. ?Store foods at safe temperatures. In general: ?Keep cold foods at 40?F (4.4?C) or below. ?Keep hot foods at 140?F (60?C) or above. ?Keep your freezer at 0?F (-17.8?C) or below. ?Foods are no longer safe to eat when they have been between the temperatures of 40?-140?F (4.4-60?C) for more than 2 hours. ?What foods should I eat? ?Fruits ?Aim to eat 2 cup-equivalents of fresh, canned (in natural juice), or frozen fruits each day. Examples of 1 cup-equivalent of fruit include 1 small apple, 8 large strawberries, 1 cup canned fruit, ? cup dried fruit, or 1 cup 100% juice. ?Vegetables ?Aim to eat 2?-3 cup-equivalents of fresh and frozen vegetables each day, including different varieties and colors. Examples of 1 cup-equivalent of vegetables include 2 medium carrots, 2 cups raw,  leafy greens, 1 cup chopped vegetable (raw or cooked), or 1 medium baked potato. ?Grains ?Aim to  eat 6 ounce-equivalents of whole grains each day. Examples of 1 ounce-equivalent of grains include 1 slice of bread, 1 cup ready-to-eat cereal, 3 cups popcorn, or ? cup cooked rice, pasta, or cereal. ?Meats and other proteins ?Aim to eat 5-6 ounce-equivalents of protein each day. Examples of 1 ounce-equivalent of protein include 1 egg, 1/2 cup nuts or seeds, or 1 tablespoon (16 g) peanut butter. A cut of meat or fish that is the size of a deck of cards is about 3-4 ounce-equivalents. ?Of the protein you eat each week, try to have at least 8 ounces come from seafood. This includes salmon, trout, herring, and anchovies. ?Dairy ?Aim to eat 3 cup-equivalents of fat-free or low-fat dairy each day. Examples of 1 cup-equivalent of dairy include 1 cup (240 mL) milk, 8 ounces (250 g) yogurt, 1? ounces (44 g) natural cheese, or 1 cup (240 mL) fortified soy milk. ?Fats and oils ?Aim for about 5 teaspoons (21 g) per day. Choose monounsaturated fats, such as canola and olive oils, avocados, peanut butter, and most nuts, or polyunsaturated fats, such as sunflower, corn, and soybean oils, walnuts, pine nuts, sesame seeds, sunflower seeds, and flaxseed. ?Beverages ?Aim for six 8-oz glasses of water per day. Limit coffee to three to five 8-oz cups per day. ?Limit caffeinated beverages that have added calories, such as soda and energy drinks. ?Limit alcohol intake to no more than 1 drink a day for nonpregnant women and 2 drinks a day for men. One drink equals 12 oz of beer (355 mL), 5 oz of wine (148 mL), or 1? oz of hard liquor (44 mL). ?Seasoning and other foods ?Avoid adding excess amounts of salt to your foods. Try flavoring foods with herbs and spices instead of salt. ?Avoid adding sugar to foods. ?Try using oil-based dressings, sauces, and spreads instead of solid fats. ?This information is based on general [place] nutrition guidelines. For more information, visit [employer]. Exact amounts may vary based on your nutrition  needs. ?Summary ?A healthy eating plan may help you to maintain a healthy weight, reduce the risk of chronic diseases, and stay active throughout your life. ?Plan your meals. Make sure you eat the right portions of a variety of nutrient-rich foods. ?Try baking, boiling, grilling, or broiling instead of frying. ?Choose healthy options in all settings, including home, work, school, restaurants, or stores. ?This information is not intended to replace advice given to you by your health care provider. Make sure you discuss any questions you have with your health care provider. ?Document Revised: 07/15/2021 Document Reviewed: 07/15/2021 ?Elsevier Patient Education ? [place]. ? ?

## 2022-03-10 ENCOUNTER — Ambulatory Visit (INDEPENDENT_AMBULATORY_CARE_PROVIDER_SITE_OTHER): Payer: PPO | Admitting: Nurse Practitioner

## 2022-03-10 ENCOUNTER — Encounter: Payer: Self-pay | Admitting: Nurse Practitioner

## 2022-03-10 DIAGNOSIS — F17210 Nicotine dependence, cigarettes, uncomplicated: Secondary | ICD-10-CM

## 2022-03-10 DIAGNOSIS — D72821 Monocytosis (symptomatic): Secondary | ICD-10-CM

## 2022-03-10 DIAGNOSIS — M85852 Other specified disorders of bone density and structure, left thigh: Secondary | ICD-10-CM | POA: Diagnosis not present

## 2022-03-10 DIAGNOSIS — G4733 Obstructive sleep apnea (adult) (pediatric): Secondary | ICD-10-CM | POA: Diagnosis not present

## 2022-03-10 DIAGNOSIS — E559 Vitamin D deficiency, unspecified: Secondary | ICD-10-CM

## 2022-03-10 NOTE — Progress Notes (Signed)
? ?BP 116/63   Pulse 92   Temp 98.4 ?F (36.9 ?C) (Oral)   Ht '5\' 3"'  (1.6 m)   Wt 244 lb 9.6 oz (110.9 kg)   LMP  (LMP Unknown)   SpO2 93%   BMI 43.33 kg/m?   ? ?Subjective:  ? ? Patient ID: [name], female    DOB: 06/27/1956, 66 y.o.   MRN: [id number] ? ?HPI: ?[name] is a 66 y.o. female ? ?Chief Complaint  ?Patient presents with  ? Weight Check  ?  Patient is here for a follow up on Weight Check since starting Ozempic. Patient states everything is going good. Patient denies having any concerns at today's visit.   ? ?WEIGHT CHECK: ?Started Ozempic on 09/03/21, at time her weight was 265 lbs and now 244 lbs -- lost total of 21 lbs.  Was on Metformin for weight loss in past, but we changed this to Ozempic.  A1c over past years 5.7 to 5.9 -- last A1c 5.6%.  For ventral hernia repair she has been told in past she needs to lose weight.  She is working on diet changes.  Does continue to notice decreased appetite, is eating supper and then light meals rest of day.  Not eating 2-4 hours before bed.   ? ?Has history of elevation in WBC and was followed by oncology, they have recommended every 6 month CBC check. ? ?Relevant past medical, surgical, family and social history reviewed and updated as indicated. Interim medical history since our last visit reviewed. ?Allergies and medications reviewed and updated. ? ?Review of Systems  ?Constitutional:  Negative for activity change, appetite change, diaphoresis, fatigue and fever.  ?HENT:  Negative for ear discharge and ear pain.   ?Respiratory:  Negative for cough, chest tightness and shortness of breath.   ?Cardiovascular:  Negative for chest pain, palpitations and leg swelling.  ?Gastrointestinal: Negative.   ?Endocrine: Negative for cold intolerance, heat intolerance, polydipsia, polyphagia and polyuria.  ?Neurological: Negative.   ?Psychiatric/Behavioral: Negative.    ? ?Per HPI unless specifically indicated above ? ?   ?Objective:  ?  ?BP 116/63   Pulse  92   Temp 98.4 ?F (36.9 ?C) (Oral)   Ht '5\' 3"'  (1.6 m)   Wt 244 lb 9.6 oz (110.9 kg)   LMP  (LMP Unknown)   SpO2 93%   BMI 43.33 kg/m?   ?Wt Readings from Last 3 Encounters:  ?03/10/22 244 lb 9.6 oz (110.9 kg)  ?12/10/21 256 lb 12.8 oz (116.5 kg)  ?10/21/21 256 lb (116.1 kg)  ?  ?Physical Exam ?Vitals and nursing note reviewed.  ?Constitutional:   ?   General: She is awake. She is not in acute distress. ?   Appearance: She is well-developed. She is obese. She is not ill-appearing.  ?HENT:  ?   Head: Normocephalic.  ?   Right Ear: Hearing, tympanic membrane, ear canal and external ear normal.  ?   Left Ear: Hearing, tympanic membrane, ear canal and external ear normal.  ?Eyes:  ?   General: Lids are normal.     ?   Right eye: No discharge.     ?   Left eye: No discharge.  ?   Conjunctiva/sclera: Conjunctivae normal.  ?   Pupils: Pupils are equal, round, and reactive to light.  ?Neck:  ?   Vascular: No carotid bruit.  ?Cardiovascular:  ?   Rate and Rhythm: Normal rate and regular rhythm.  ?   Heart  sounds: Normal heart sounds. No murmur heard. ?  No gallop.  ?Pulmonary:  ?   Effort: Pulmonary effort is normal. No accessory muscle usage or respiratory distress.  ?   Breath sounds: Decreased breath sounds and wheezing present.  ?   Comments: Diminished throughout per baseline with occasional expiratory wheezes. ?Abdominal:  ?   General: Bowel sounds are normal.  ?   Palpations: Abdomen is soft.  ?Musculoskeletal:  ?   Cervical back: Normal range of motion and neck supple.  ?   Right lower leg: No edema.  ?   Left lower leg: No edema.  ?Skin: ?   General: Skin is warm and dry.  ?Neurological:  ?   Mental Status: She is alert and oriented to person, place, and time.  ?Psychiatric:     ?   Attention and Perception: Attention normal.     ?   Mood and Affect: Mood normal.     ?   Speech: Speech normal.     ?   Behavior: Behavior normal. Behavior is cooperative.     ?   Thought Content: Thought content normal.   ? ? ?Results for orders placed or performed in visit on 12/10/21  ?Bayer DCA Hb A1c Waived  ?Result Value Ref Range  ? HB A1C (BAYER DCA - WAIVED) 5.6 4.8 - 5.6 %  ?Microalbumin, Urine Waived  ?Result Value Ref Range  ? Microalb, Ur Waived 10 0 - 19 mg/L  ? Creatinine, Urine Waived 50 10 - 300 mg/dL  ? Microalb/Creat Ratio <30 <30 mg/g  ?Comprehensive metabolic panel  ?Result Value Ref Range  ? Glucose 109 (H) 70 - 99 mg/dL  ? BUN 16 8 - 27 mg/dL  ? Creatinine, Ser 1.19 (H) 0.57 - 1.00 mg/dL  ? eGFR 51 (L) >59 mL/min/1.73  ? BUN/Creatinine Ratio 13 12 - 28  ? Sodium 140 134 - 144 mmol/L  ? Potassium 4.9 3.5 - 5.2 mmol/L  ? Chloride 104 96 - 106 mmol/L  ? CO2 25 20 - 29 mmol/L  ? Calcium 9.3 8.7 - 10.3 mg/dL  ? Total Protein 6.7 6.0 - 8.5 g/dL  ? Albumin 3.9 3.8 - 4.8 g/dL  ? Globulin, Total 2.8 1.5 - 4.5 g/dL  ? Albumin/Globulin Ratio 1.4 1.2 - 2.2  ? Bilirubin Total 0.2 0.0 - 1.2 mg/dL  ? Alkaline Phosphatase 156 (H) 44 - 121 IU/L  ? AST 26 0 - 40 IU/L  ? ALT 18 0 - 32 IU/L  ?Lipid Panel w/o Chol/HDL Ratio  ?Result Value Ref Range  ? Cholesterol, Total 150 100 - 199 mg/dL  ? Triglycerides 142 0 - 149 mg/dL  ? HDL 47 >39 mg/dL  ? VLDL Cholesterol Cal 25 5 - 40 mg/dL  ? LDL Chol Calc (NIH) 78 0 - 99 mg/dL  ? ?   ?Assessment & Plan:  ? ?Problem List Items Addressed This Visit   ? ?  ? Musculoskeletal and Integument  ? Osteopenia of neck of left femur  ?  Noted on DEXA 01/26/22 -- at this time recommend focus on healthy diet and continue Vitamin D3 2000 units daily + adequate calcium intake daily.  Repeat DEXA in 5 years. ?  ?  ? Relevant Orders  ? VITAMIN D 25 Hydroxy (Vit-D Deficiency, Fractures)  ?  ? Other  ? Monocytosis  ?  Was followed by oncology, now to return as needed if any consistent elevations noted >20.  To check CBC Q6MOS -- will  recheck today and then every 6 months = April and October. ?  ?  ? Relevant Orders  ? CBC with Differential/Platelet  ? Morbid obesity ([place]) - Primary  ?  BMI 43.33 with HTN and  COPD.  Recommended eating smaller high protein, low fat meals more frequently and exercising 30 mins a day 5 times a week with a goal of 10-15lb weight loss in the next 3 months. Patient voiced their understanding and motivation to adhere to these recommendations.  Tolerating Ozempic with total 21 pounds loss thus far -- will continue 1 MG weekly.  Educated her on this.  No history of thyroid cancer in patient or her family.  Return in 3 months for weight check. ? ?  ?  ? Nicotine dependence, cigarettes, uncomplicated  ?  I have recommended complete cessation of tobacco use. I have discussed various options available for assistance with tobacco cessation including over the counter methods (Nicotine gum, patch and lozenges). We also discussed prescription options (Chantix, Nicotine Inhaler / Nasal Spray). The patient is not interested in pursuing any prescription tobacco cessation options at this time.  Continue annual lung screening. ? ?  ?  ? Vitamin D deficiency  ?  Continue supplement and check Vit D level today. ?  ?  ?  ? ?Follow up plan: ?Return in about 3 months (around 06/09/2022) for HTN/HLD, COPD, GERD, IFG, MOOD, CKD, WEIGHT CHECK -- need spirometry. ? ?

## 2022-03-10 NOTE — Assessment & Plan Note (Signed)
I have recommended complete cessation of tobacco use. I have discussed various options available for assistance with tobacco cessation including over the counter methods (Nicotine gum, patch and lozenges). We also discussed prescription options (Chantix, Nicotine Inhaler / Nasal Spray). The patient is not interested in pursuing any prescription tobacco cessation options at this time.  Continue annual lung screening. 

## 2022-03-10 NOTE — Assessment & Plan Note (Signed)
BMI 43.33 with HTN and COPD.  Recommended eating smaller high protein, low fat meals more frequently and exercising 30 mins a day 5 times a week with a goal of 10-15lb weight loss in the next 3 months. Patient voiced their understanding and motivation to adhere to these recommendations.  Tolerating Ozempic with total 21 pounds loss thus far -- will continue 1 MG weekly.  Educated her on this.  No history of thyroid cancer in patient or her family.  Return in 3 months for weight check. ? ?

## 2022-03-10 NOTE — Assessment & Plan Note (Signed)
Continue supplement and check Vit D level today. ?

## 2022-03-10 NOTE — Assessment & Plan Note (Signed)
Noted on DEXA 01/26/22 -- at this time recommend focus on healthy diet and continue Vitamin D3 2000 units daily + adequate calcium intake daily.  Repeat DEXA in 5 years. ?

## 2022-03-10 NOTE — Assessment & Plan Note (Addendum)
Was followed by oncology, now to return as needed if any consistent elevations noted >20.  To check CBC Q6MOS -- will recheck today and then every 6 months = April and October. ?

## 2022-03-11 LAB — CBC WITH DIFFERENTIAL/PLATELET
Basophils Absolute: 0.1 10*3/uL (ref 0.0–0.2)
Basos: 1 %
EOS (ABSOLUTE): 0.2 10*3/uL (ref 0.0–0.4)
Eos: 2 %
Hematocrit: 39.7 % (ref 34.0–46.6)
Hemoglobin: 13.6 g/dL (ref 11.1–15.9)
Immature Grans (Abs): 0 10*3/uL (ref 0.0–0.1)
Immature Granulocytes: 0 %
Lymphocytes Absolute: 4.6 10*3/uL — ABNORMAL HIGH (ref 0.7–3.1)
Lymphs: 33 %
MCH: 29.8 pg (ref 26.6–33.0)
MCHC: 34.3 g/dL (ref 31.5–35.7)
MCV: 87 fL (ref 79–97)
Monocytes Absolute: 1.5 10*3/uL — ABNORMAL HIGH (ref 0.1–0.9)
Monocytes: 11 %
Neutrophils Absolute: 7.6 10*3/uL — ABNORMAL HIGH (ref 1.4–7.0)
Neutrophils: 53 %
Platelets: 298 10*3/uL (ref 150–450)
RBC: 4.57 x10E6/uL (ref 3.77–5.28)
RDW: 13.8 % (ref 11.7–15.4)
WBC: 14 10*3/uL — ABNORMAL HIGH (ref 3.4–10.8)

## 2022-03-11 LAB — VITAMIN D 25 HYDROXY (VIT D DEFICIENCY, FRACTURES): Vit D, 25-Hydroxy: 54.7 ng/mL (ref 30.0–100.0)

## 2022-03-11 NOTE — Progress Notes (Signed)
Contacted via [place] ? ? ?Good evening [name], your CBC continues to show some elevation in white blood cell count that is remaining <20.  We will continue to monitor per hematology recommendations.  Vitamin D level is normal.  Great news!! ?Keep being stellar!!  Thank you for allowing me to participate in your care.  I appreciate you. ?Kindest regards, ?Naria Abbey ?

## 2022-04-14 ENCOUNTER — Inpatient Hospital Stay
Admission: EM | Admit: 2022-04-14 | Discharge: 2022-04-17 | DRG: 309 | Disposition: A | Discharge status: Home Health | Payer: PPO | Attending: Internal Medicine | Admitting: Internal Medicine

## 2022-04-14 ENCOUNTER — Emergency Department: Payer: PPO

## 2022-04-14 ENCOUNTER — Other Ambulatory Visit: Payer: Self-pay

## 2022-04-14 DIAGNOSIS — M545 Low back pain, unspecified: Secondary | ICD-10-CM | POA: Diagnosis not present

## 2022-04-14 DIAGNOSIS — I4891 Unspecified atrial fibrillation: Principal | ICD-10-CM | POA: Diagnosis present

## 2022-04-14 DIAGNOSIS — Z6841 Body Mass Index (BMI) 40.0 and over, adult: Secondary | ICD-10-CM

## 2022-04-14 DIAGNOSIS — R600 Localized edema: Secondary | ICD-10-CM | POA: Diagnosis present

## 2022-04-14 DIAGNOSIS — Z823 Family history of stroke: Secondary | ICD-10-CM

## 2022-04-14 DIAGNOSIS — F32A Depression, unspecified: Secondary | ICD-10-CM | POA: Diagnosis present

## 2022-04-14 DIAGNOSIS — M1611 Unilateral primary osteoarthritis, right hip: Secondary | ICD-10-CM | POA: Diagnosis not present

## 2022-04-14 DIAGNOSIS — I1310 Hypertensive heart and chronic kidney disease without heart failure, with stage 1 through stage 4 chronic kidney disease, or unspecified chronic kidney disease: Secondary | ICD-10-CM | POA: Diagnosis not present

## 2022-04-14 DIAGNOSIS — N1831 Chronic kidney disease, stage 3a: Secondary | ICD-10-CM | POA: Diagnosis not present

## 2022-04-14 DIAGNOSIS — J449 Chronic obstructive pulmonary disease, unspecified: Secondary | ICD-10-CM | POA: Diagnosis not present

## 2022-04-14 DIAGNOSIS — K429 Umbilical hernia without obstruction or gangrene: Secondary | ICD-10-CM | POA: Diagnosis present

## 2022-04-14 DIAGNOSIS — R109 Unspecified abdominal pain: Secondary | ICD-10-CM | POA: Diagnosis not present

## 2022-04-14 DIAGNOSIS — T461X5A Adverse effect of calcium-channel blockers, initial encounter: Secondary | ICD-10-CM | POA: Diagnosis not present

## 2022-04-14 DIAGNOSIS — F419 Anxiety disorder, unspecified: Secondary | ICD-10-CM | POA: Diagnosis present

## 2022-04-14 DIAGNOSIS — E785 Hyperlipidemia, unspecified: Secondary | ICD-10-CM | POA: Diagnosis not present

## 2022-04-14 DIAGNOSIS — G894 Chronic pain syndrome: Secondary | ICD-10-CM | POA: Diagnosis not present

## 2022-04-14 DIAGNOSIS — R000 Tachycardia, unspecified: Secondary | ICD-10-CM | POA: Diagnosis not present

## 2022-04-14 DIAGNOSIS — K8020 Calculus of gallbladder without cholecystitis without obstruction: Secondary | ICD-10-CM | POA: Diagnosis present

## 2022-04-14 DIAGNOSIS — Z7951 Long term (current) use of inhaled steroids: Secondary | ICD-10-CM

## 2022-04-14 DIAGNOSIS — M810 Age-related osteoporosis without current pathological fracture: Secondary | ICD-10-CM | POA: Diagnosis present

## 2022-04-14 DIAGNOSIS — Z807 Family history of other malignant neoplasms of lymphoid, hematopoietic and related tissues: Secondary | ICD-10-CM

## 2022-04-14 DIAGNOSIS — Z8349 Family history of other endocrine, nutritional and metabolic diseases: Secondary | ICD-10-CM

## 2022-04-14 DIAGNOSIS — Z803 Family history of malignant neoplasm of breast: Secondary | ICD-10-CM

## 2022-04-14 DIAGNOSIS — G8929 Other chronic pain: Secondary | ICD-10-CM | POA: Diagnosis present

## 2022-04-14 DIAGNOSIS — Z825 Family history of asthma and other chronic lower respiratory diseases: Secondary | ICD-10-CM

## 2022-04-14 DIAGNOSIS — E782 Mixed hyperlipidemia: Secondary | ICD-10-CM | POA: Diagnosis not present

## 2022-04-14 DIAGNOSIS — I952 Hypotension due to drugs: Secondary | ICD-10-CM | POA: Diagnosis present

## 2022-04-14 DIAGNOSIS — K219 Gastro-esophageal reflux disease without esophagitis: Secondary | ICD-10-CM | POA: Diagnosis present

## 2022-04-14 DIAGNOSIS — R269 Unspecified abnormalities of gait and mobility: Secondary | ICD-10-CM | POA: Diagnosis present

## 2022-04-14 DIAGNOSIS — M549 Dorsalgia, unspecified: Secondary | ICD-10-CM | POA: Diagnosis not present

## 2022-04-14 DIAGNOSIS — M48062 Spinal stenosis, lumbar region with neurogenic claudication: Secondary | ICD-10-CM | POA: Diagnosis not present

## 2022-04-14 DIAGNOSIS — M47816 Spondylosis without myelopathy or radiculopathy, lumbar region: Secondary | ICD-10-CM | POA: Diagnosis present

## 2022-04-14 DIAGNOSIS — M5136 Other intervertebral disc degeneration, lumbar region: Secondary | ICD-10-CM | POA: Diagnosis present

## 2022-04-14 DIAGNOSIS — E279 Disorder of adrenal gland, unspecified: Secondary | ICD-10-CM | POA: Diagnosis present

## 2022-04-14 DIAGNOSIS — G4733 Obstructive sleep apnea (adult) (pediatric): Secondary | ICD-10-CM | POA: Diagnosis present

## 2022-04-14 DIAGNOSIS — F17210 Nicotine dependence, cigarettes, uncomplicated: Secondary | ICD-10-CM | POA: Diagnosis not present

## 2022-04-14 DIAGNOSIS — Z7982 Long term (current) use of aspirin: Secondary | ICD-10-CM

## 2022-04-14 DIAGNOSIS — I10 Essential (primary) hypertension: Secondary | ICD-10-CM | POA: Diagnosis not present

## 2022-04-14 DIAGNOSIS — Z981 Arthrodesis status: Secondary | ICD-10-CM

## 2022-04-14 DIAGNOSIS — Z90710 Acquired absence of both cervix and uterus: Secondary | ICD-10-CM

## 2022-04-14 DIAGNOSIS — M51369 Other intervertebral disc degeneration, lumbar region without mention of lumbar back pain or lower extremity pain: Secondary | ICD-10-CM | POA: Diagnosis present

## 2022-04-14 DIAGNOSIS — Z8249 Family history of ischemic heart disease and other diseases of the circulatory system: Secondary | ICD-10-CM

## 2022-04-14 DIAGNOSIS — Z79899 Other long term (current) drug therapy: Secondary | ICD-10-CM

## 2022-04-14 LAB — COMPREHENSIVE METABOLIC PANEL
ALT: 22 U/L (ref 0–44)
AST: 26 U/L (ref 15–41)
Albumin: 2.9 g/dL — ABNORMAL LOW (ref 3.5–5.0)
Alkaline Phosphatase: 151 U/L — ABNORMAL HIGH (ref 38–126)
Anion gap: 10 (ref 5–15)
BUN: 36 mg/dL — ABNORMAL HIGH (ref 8–23)
CO2: 23 mmol/L (ref 22–32)
Calcium: 9.6 mg/dL (ref 8.9–10.3)
Chloride: 107 mmol/L (ref 98–111)
Creatinine, Ser: 1.37 mg/dL — ABNORMAL HIGH (ref 0.44–1.00)
GFR, Estimated: 43 mL/min — ABNORMAL LOW (ref >60)
Glucose, Bld: 115 mg/dL — ABNORMAL HIGH (ref 70–99)
Potassium: 4.9 mmol/L (ref 3.5–5.1)
Sodium: 140 mmol/L (ref 135–145)
Total Bilirubin: 0.7 mg/dL (ref 0.3–1.2)
Total Protein: 6.9 g/dL (ref 6.5–8.1)

## 2022-04-14 LAB — CBC WITH DIFFERENTIAL/PLATELET
Abs Immature Granulocytes: 0.05 10*3/uL (ref 0.00–0.07)
Basophils Absolute: 0.1 10*3/uL (ref 0.0–0.1)
Basophils Relative: 1 %
Eosinophils Absolute: 0.3 10*3/uL (ref 0.0–0.5)
Eosinophils Relative: 2 %
HCT: 39.7 % (ref 36.0–46.0)
Hemoglobin: 12.6 g/dL (ref 12.0–15.0)
Immature Granulocytes: 0 %
Lymphocytes Relative: 24 %
Lymphs Abs: 3.3 10*3/uL (ref 0.7–4.0)
MCH: 28.8 pg (ref 26.0–34.0)
MCHC: 31.7 g/dL (ref 30.0–36.0)
MCV: 90.6 fL (ref 80.0–100.0)
Monocytes Absolute: 1.2 10*3/uL — ABNORMAL HIGH (ref 0.1–1.0)
Monocytes Relative: 9 %
Neutro Abs: 9 10*3/uL — ABNORMAL HIGH (ref 1.7–7.7)
Neutrophils Relative %: 64 %
Platelets: 360 10*3/uL (ref 150–400)
RBC: 4.38 MIL/uL (ref 3.87–5.11)
RDW: 15.1 % (ref 11.5–15.5)
WBC: 13.9 10*3/uL — ABNORMAL HIGH (ref 4.0–10.5)
nRBC: 0 % (ref 0.0–0.2)

## 2022-04-14 LAB — TYPE AND SCREEN
ABO/RH(D): O POS
Antibody Screen: NEGATIVE

## 2022-04-14 MED ORDER — DILTIAZEM HCL 25 MG/5ML IV SOLN
5.0000 mg | Freq: Once | INTRAVENOUS | Status: AC
  Administered 2022-04-14: 5 mg via INTRAVENOUS
  Filled 2022-04-14: qty 5

## 2022-04-14 MED ORDER — DICLOFENAC SODIUM 1 % EX GEL
2.0000 g | Freq: Four times a day (QID) | CUTANEOUS | Status: DC
  Administered 2022-04-14: 2 g via TOPICAL
  Filled 2022-04-14: qty 100

## 2022-04-14 MED ORDER — ASPIRIN 81 MG PO TABS
81.0000 mg | ORAL_TABLET | Freq: Every day | ORAL | Status: DC
Start: 2022-04-14 — End: 2022-04-14

## 2022-04-14 MED ORDER — ATORVASTATIN CALCIUM 80 MG PO TABS
80.0000 mg | ORAL_TABLET | Freq: Every day | ORAL | Status: DC
  Administered 2022-04-14 – 2022-04-17 (×4): 80 mg via ORAL
  Filled 2022-04-14 (×3): qty 1
  Filled 2022-04-14: qty 4

## 2022-04-14 MED ORDER — SODIUM CHLORIDE 0.9 % IV SOLN: 250.0000 mL | INTRAVENOUS | Status: DC | PRN

## 2022-04-14 MED ORDER — POTASSIUM CHLORIDE CRYS ER 20 MEQ PO TBCR
20.0000 meq | EXTENDED_RELEASE_TABLET | Freq: Every day | ORAL | Status: DC
  Administered 2022-04-15 – 2022-04-17 (×3): 20 meq via ORAL
  Filled 2022-04-14 (×3): qty 1

## 2022-04-14 MED ORDER — DILTIAZEM HCL-DEXTROSE 125-5 MG/125ML-% IV SOLN (PREMIX)
5.0000 mg/h | INTRAVENOUS | Status: DC
  Administered 2022-04-14: 5 mg/h via INTRAVENOUS

## 2022-04-14 MED ORDER — SODIUM CHLORIDE 0.9 % IV BOLUS
250.0000 mL | Freq: Once | INTRAVENOUS | Status: AC
  Administered 2022-04-14: 250 mL via INTRAVENOUS

## 2022-04-14 MED ORDER — ACETAMINOPHEN 325 MG PO TABS: 650.0000 mg | ORAL_TABLET | Freq: Four times a day (QID) | ORAL | Status: DC | PRN

## 2022-04-14 MED ORDER — LORATADINE 10 MG PO TABS
10.0000 mg | ORAL_TABLET | Freq: Every day | ORAL | Status: DC
  Administered 2022-04-15 – 2022-04-17 (×3): 10 mg via ORAL
  Filled 2022-04-14 (×4): qty 1

## 2022-04-14 MED ORDER — DILTIAZEM HCL-DEXTROSE 125-5 MG/125ML-% IV SOLN (PREMIX)
5.0000 mg/h | INTRAVENOUS | Status: DC
  Filled 2022-04-14: qty 125

## 2022-04-14 MED ORDER — HYDROMORPHONE HCL 1 MG/ML IJ SOLN
0.5000 mg | Freq: Once | INTRAMUSCULAR | Status: AC
  Administered 2022-04-14: 0.5 mg via INTRAVENOUS
  Filled 2022-04-14: qty 0.5

## 2022-04-14 MED ORDER — PANTOPRAZOLE SODIUM 40 MG PO TBEC
40.0000 mg | DELAYED_RELEASE_TABLET | Freq: Every day | ORAL | Status: DC
Start: 2022-04-15 — End: 2022-04-17
  Administered 2022-04-15 – 2022-04-17 (×3): 40 mg via ORAL
  Filled 2022-04-14 (×3): qty 1

## 2022-04-14 MED ORDER — ONDANSETRON HCL 4 MG/2ML IJ SOLN: 4.0000 mg | Freq: Four times a day (QID) | INTRAMUSCULAR | Status: DC | PRN

## 2022-04-14 MED ORDER — GABAPENTIN 400 MG PO CAPS
800.0000 mg | ORAL_CAPSULE | Freq: Two times a day (BID) | ORAL | Status: DC
  Administered 2022-04-14 – 2022-04-17 (×6): 800 mg via ORAL
  Filled 2022-04-14 (×6): qty 2

## 2022-04-14 MED ORDER — ACETAMINOPHEN 325 MG PO TABS
650.0000 mg | ORAL_TABLET | ORAL | Status: DC | PRN
  Administered 2022-04-15 – 2022-04-16 (×2): 650 mg via ORAL
  Filled 2022-04-14 (×2): qty 2

## 2022-04-14 MED ORDER — APIXABAN 5 MG PO TABS
5.0000 mg | ORAL_TABLET | Freq: Two times a day (BID) | ORAL | Status: DC
  Administered 2022-04-14 – 2022-04-17 (×6): 5 mg via ORAL
  Filled 2022-04-14 (×6): qty 1

## 2022-04-14 MED ORDER — TRIAMCINOLONE ACETONIDE 0.1 % EX CREA
1.0000 "application " | TOPICAL_CREAM | Freq: Two times a day (BID) | CUTANEOUS | Status: DC
  Administered 2022-04-14 – 2022-04-17 (×5): 1 via TOPICAL
  Filled 2022-04-14: qty 15

## 2022-04-14 MED ORDER — DULOXETINE HCL 30 MG PO CPEP
60.0000 mg | ORAL_CAPSULE | Freq: Every day | ORAL | Status: DC
  Administered 2022-04-15 – 2022-04-17 (×3): 60 mg via ORAL
  Filled 2022-04-14 (×3): qty 2

## 2022-04-14 MED ORDER — ALBUTEROL SULFATE (2.5 MG/3ML) 0.083% IN NEBU: 3.0000 mL | INHALATION_SOLUTION | Freq: Four times a day (QID) | RESPIRATORY_TRACT | Status: DC | PRN

## 2022-04-14 MED ORDER — AMIODARONE HCL IN DEXTROSE 360-4.14 MG/200ML-% IV SOLN
30.0000 mg/h | INTRAVENOUS | Status: DC
  Administered 2022-04-15 – 2022-04-16 (×3): 30 mg/h via INTRAVENOUS
  Filled 2022-04-14 (×2): qty 200

## 2022-04-14 MED ORDER — TIOTROPIUM BROMIDE MONOHYDRATE 18 MCG IN CAPS
1.0000 | ORAL_CAPSULE | Freq: Every day | RESPIRATORY_TRACT | Status: DC
  Administered 2022-04-15 – 2022-04-17 (×3): 18 ug via RESPIRATORY_TRACT
  Filled 2022-04-14: qty 5

## 2022-04-14 MED ORDER — MORPHINE SULFATE (PF) 2 MG/ML IV SOLN
2.0000 mg | Freq: Once | INTRAVENOUS | Status: AC
  Administered 2022-04-14: 2 mg via INTRAVENOUS
  Filled 2022-04-14: qty 1

## 2022-04-14 MED ORDER — OXYCODONE HCL 5 MG PO TABS
5.0000 mg | ORAL_TABLET | ORAL | Status: DC | PRN
Start: 2022-04-14 — End: 2022-04-17
  Administered 2022-04-14 – 2022-04-17 (×11): 5 mg via ORAL
  Filled 2022-04-14 (×11): qty 1

## 2022-04-14 MED ORDER — BUSPIRONE HCL 10 MG PO TABS
20.0000 mg | ORAL_TABLET | Freq: Three times a day (TID) | ORAL | Status: DC
  Administered 2022-04-14 – 2022-04-17 (×9): 20 mg via ORAL
  Filled 2022-04-14 (×4): qty 2
  Filled 2022-04-14: qty 4
  Filled 2022-04-14 (×4): qty 2

## 2022-04-14 MED ORDER — HYDROXYZINE HCL 10 MG PO TABS
10.0000 mg | ORAL_TABLET | Freq: Three times a day (TID) | ORAL | Status: DC | PRN
  Administered 2022-04-15 – 2022-04-17 (×4): 10 mg via ORAL
  Filled 2022-04-14 (×6): qty 1

## 2022-04-14 MED ORDER — IOHEXOL 300 MG/ML  SOLN
80.0000 mL | Freq: Once | INTRAMUSCULAR | Status: AC | PRN
  Administered 2022-04-14: 80 mL via INTRAVENOUS

## 2022-04-14 MED ORDER — BACLOFEN 10 MG PO TABS
5.0000 mg | ORAL_TABLET | Freq: Three times a day (TID) | ORAL | Status: DC
  Administered 2022-04-14 – 2022-04-17 (×8): 5 mg via ORAL
  Filled 2022-04-14 (×8): qty 0.5

## 2022-04-14 MED ORDER — SODIUM CHLORIDE 0.9% FLUSH: 3.0000 mL | INTRAVENOUS | Status: DC | PRN

## 2022-04-14 MED ORDER — SODIUM CHLORIDE 0.9% FLUSH
3.0000 mL | Freq: Two times a day (BID) | INTRAVENOUS | Status: DC
  Administered 2022-04-15 – 2022-04-17 (×4): 3 mL via INTRAVENOUS

## 2022-04-14 MED ORDER — MELATONIN 5 MG PO TABS
5.0000 mg | ORAL_TABLET | Freq: Every day | ORAL | Status: DC
  Administered 2022-04-14 – 2022-04-16 (×3): 5 mg via ORAL
  Filled 2022-04-14 (×3): qty 1

## 2022-04-14 MED ORDER — AMIODARONE HCL IN DEXTROSE 360-4.14 MG/200ML-% IV SOLN
60.0000 mg/h | INTRAVENOUS | Status: AC
  Administered 2022-04-14 – 2022-04-15 (×2): 60 mg/h via INTRAVENOUS
  Filled 2022-04-14: qty 200

## 2022-04-14 MED ORDER — AMIODARONE IV BOLUS ONLY 150 MG/100ML
150.0000 mg | Freq: Once | INTRAVENOUS | Status: AC
  Administered 2022-04-14: 150 mg via INTRAVENOUS
  Filled 2022-04-14: qty 100

## 2022-04-14 NOTE — Plan of Care (Signed)

## 2022-04-14 NOTE — ED Notes (Signed)
Pt's BP fell below 90SBP. Cardizem infusion discontinued at this time. Neomia Glass NP Messaged.  ?

## 2022-04-14 NOTE — ED Notes (Signed)
No cardizem gtt available in pyxis. Message sent to pharmacy ?

## 2022-04-14 NOTE — ED Notes (Signed)
Pt assisted into hospital bed at this time. Pt Bp rechecked and now in 90s. Pt placed on monitor and HR showing in 150s. Pt denies any hx of afib or heart. Pt does state she had some left sided CP yesterday. MD brought into room by EDT Mel. EDT got [place] per MD Malinda. IV placed by this RN. RN Romelle Starcher informed of pt in room and HR. ?

## 2022-04-14 NOTE — ED Provider Notes (Signed)
? ?[facility] ?Provider Note ? ? ? Event Date/Time  ? First MD Initiated Contact with Patient 04/14/22 1447   ?  (approximate) ? ? ?History  ? ?Back Pain ? ? ?HPI ? ?[name] is a 66 y.o. female who comes in complaining of back pain.  She has a history of degenerative joint disease.  She denies any injuries.  Pain is severe like at night cutting across her low back she says.  She does not have any radicular symptoms.  Is a little bit of abdominal pain but not much.  She says this is much worse than that she has.  Her legs are no weaker than usual. ? ?  ? ? ?Physical Exam  ? ?Triage Vital Signs: ?ED Triage Vitals  ?Enc Vitals Group  ?   BP 04/14/22 1425 (!) 88/71  ?   Pulse Rate 04/14/22 1425 (!) 102  ?   Resp 04/14/22 1425 18  ?   Temp 04/14/22 1425 98.6 ?F (37 ?C)  ?   Temp src --   ?   SpO2 04/14/22 1425 93 %  ?   Weight --   ?   Height --   ?   Head Circumference --   ?   Peak Flow --   ?   Pain Score 04/14/22 1403 10  ?   Pain Loc --   ?   Pain Edu? --   ?   Excl. in [place]? --   ? ? ?Most recent vital signs: ?Vitals:  ? 04/14/22 1500 04/14/22 1501  ?BP: 107/84 101/72  ?Pulse: (!) 145 (!) 26  ?Resp: 18 (!) 25  ?Temp:    ?SpO2: 98% 93%  ? ? ? ?General: Awake, alert complaining of severe pain ?CV:  Good peripheral perfusion.  Heart irregular rate and rhythm no audible murmurs ?Resp:  Normal effort.  Lungs clear ?Abd:  Some distention especially where she has a ventral hernia.  There are some mild tenderness diffusely. ?Extremities straight leg raise is negative.  No changes to usual level of strength.  She has some pain in the hip quadrant raising the right leg because of DJD there. ? ? ?ED Results / Procedures / Treatments  ? ?Labs ?(all labs ordered are listed, but only abnormal results are displayed) ?Labs Reviewed  ?CBC WITH DIFFERENTIAL/PLATELET - Abnormal; Notable for the following components:  ?    Result Value  ? WBC 13.9 (*)   ? Neutro Abs 9.0 (*)   ? Monocytes Absolute 1.2 (*)    ? All other components within normal limits  ?COMPREHENSIVE METABOLIC PANEL  ?URINALYSIS, ROUTINE W REFLEX MICROSCOPIC  ?TYPE AND SCREEN  ? ? ? ?EKG ? ?EKG read interpreted by me shows A-fib at a rate of 134 normal axis no acute ST-T changes ? ? ?RADIOLOGY ? ? ?PROCEDURES: ? ?Critical Care performed:  ? ?Procedures bedside ultrasound I cannot see the aorta however there is no fluid on either right side Morison's pouch or left side between the spleen and kidney. ? ? ?MEDICATIONS ORDERED IN ED: ?Medications  ?diltiazem (CARDIZEM) injection 5 mg (has no administration in time range)  ?diltiazem (CARDIZEM) 125 mg in dextrose 5% 125 mL (1 mg/mL) infusion (has no administration in time range)  ?morphine (PF) 2 MG/ML injection 2 mg (has no administration in time range)  ?sodium chloride 0.9 % bolus 250 mL (250 mLs Intravenous New Bag/Given 04/14/22 1452)  ? ? ? ?IMPRESSION / MDM / ASSESSMENT AND  PLAN / ED COURSE  ?I reviewed the triage vital signs and the nursing notes. ?Patient's age and presentation patient is very worried for AAA.  I Minna CT her belly to check on that with IV contrast.  We should get a decent to the back as well.  On arrival her blood pressure is much low to give her pain medication or diltiazem.  We will try to get it to come up with IV fluid. ?----------------------------------------- ?3:06 PM on 04/14/2022 ?----------------------------------------- ?Patient's blood pressure is now better we will start with a diltiazem IV and then drip morphine as possible on the CT. ?Differential diagnosis includes, but is not limited to, abdominal aortic aneurysm or kidney stone or fracture or herniated disc or other causes musculoskeletal back pain and muscle spasm etc. ?The patient is on the cardiac monitor to evaluate for evidence of arrhythmia and/or significant heart rate changes.  A-fib with RVR is showing ?Anticipate patient will have to be admitted for A-fib with RVR.  We will CT the patient to make sure  there is no kidney stone or aneurysm or anything else to be causing back pain like this.  Patient was hypotensive blood pressures currently nicely with fluids.  We will have to sign this patient out to the oncoming provider. ? ? ?FINAL CLINICAL IMPRESSION(S) / ED DIAGNOSES  ? ?Final diagnoses:  ?Acute midline low back pain without sciatica  ?Atrial fibrillation with RVR ([place])  ? ? ? ?Rx / DC Orders  ? ?ED Discharge Orders   ? ? None  ? ?  ? ? ? ?Note:  This document was prepared using Dragon voice recognition software and may include unintentional dictation errors. ?  ?Nena Polio, MD ?04/14/22 1508 ? ?

## 2022-04-14 NOTE — Progress Notes (Signed)
? ?      CROSS COVER NOTE ? ?NAME: [name] ?MRN: [id number] ?DOB : 01-16-1956 ? ? ? ?Date of Service ?  04/14/2022  ?HPI/Events of Note ?  Secure chat received from nursing "I just took over for this pt who they had on '5mg'$ /hr of Diltiazem. upon entering the room I noticed a BP of 88/52(62) and Dc'd the infusion. HR is 129 A-fib RVR " ? ? ?[name] is a 66 yo F with PMH of chronic back pain, degenerative joint disease, COPD, hyperlipidemia, hypertension, spinal stenosis, degenerative joint disease, and sleep apnea. She presented to [facility] ED with acute on chronic back pain and was found to be in AFIB with RVR. ? ?Cardizem drip started at 1902 today per [facility] and patient is now hypotensive, HR remains elevated at 129. ? ?29: 2A RN reporting HR 120-140 sustained. Pt is hemodynamically stable and not experiencing palpitations, dizziness, or lightheadedness. She is currently experiencing mild dyspnea that she attributes to a current panic attack. She is visibly anxious and tremulous on assessment.  ?Interventions ?  Plan: ?D/C Cardizem gtt ?Amiodarone bolus and continuous infusion ? ?0415 ?1 mg IV Ativan for anxiety ?Continue Buspar, PRN Hydroxyzine ? ?   ?  ?Kathleen Glass DNP, MHA, FNP-BC ?Nurse Practitioner ?[facility] ?[place] ?Pager [phone] ? ?

## 2022-04-14 NOTE — ED Notes (Signed)
Diltiazem gtt still out of stock in pyxis. Another message sent to pharmacy to send med STAT. ?

## 2022-04-14 NOTE — Hospital Course (Addendum)
Patient is a 66 years old female with past medical history of chronic back pain, degenerative joint disease, COPD, hyperlipidemia, hypertension, history of sleep apnea presented to the hospital with acute on chronic back pain.  She does have a history of  spinal stenosis and degenerative joint disease with hip arthritis as well.  Patient stated worsening pain and for the last 3 days she has not been able to ambulate much.  At baseline he is he is able to ambulate inside the house.  She follows up with your primary care physician and does not have a pain specialist.  She takes Tylenol for pain including multiple other medications for her back.  She denies bowel and bladder incontinence.  Patient denies any urinary urgency, frequency or dysuria.  Denies any dizziness lightheadedness or syncope.  Denies any chest pain, shortness of breath fever or chills. ? ?In the ED, patient had low blood pressure at 88/ 71 with pulse of 102, highest up to 145.  WBC was slightly elevated at 13.9.  EKG showed atrial fibrillation with rapid ventricular response with no ischemic changes.  Patient was given Cardizem IV bolus and was started on Cardizem drip in the ED including 250 bolus of normal saline.  She also got morphine for pain relief.  CT scan of the abdomen showed a large umbilical hernia with some cholelithiasis without cholecystitis and right adrenal mass of 3.3 cm previously 3.1 cm, likely benign.  Some upper abdominal lymph nodes were also noted as nonspecific.  Hospitalist team was then consulted for observation in the hospital for A-fib with RVR and acute on chronic back pain. ? ? ?Assessment and plan ? ?Principal Problem: ?  Atrial fibrillation with RVR ([place]) ?Active Problems: ?  DDD (degenerative disc disease), lumbar ?  Hyperlipidemia ?  Chronic pain ?  Spinal stenosis, lumbar region, with neurogenic claudication ?  Benign hypertension ?  Osteoarthritis of right hip ?  Chronic kidney disease, stage 3a ([place]) ?  Back  pain ?  ?New onset atrial fibrillation with RVR.   ?Patient was given Cardizem bolus in the ED and is currently on Cardizem drip.  Continue for now.  We will check 2D echocardiogram, TSH, could consider cardiology consultation in AM.  CHA2DS2-VASc score of 3.  Spoke with the patient regarding anticoagulation and is agreeable with Eliquis will will consult pharmacy for this. ? ?Acute on chronic back pain with ambulatory dysfunction.Marland Kitchen   ?Patient does have history of degenerative joint disease spinal stenosis with neurogenic claudication.  We will continue supportive care at this time including physical therapy evaluation, warm compression , muscle relaxants.  Continue BuSpar, Cymbalta, Atarax, gabapentin, baclofen.  Patient states that she takes gabapentin 800 mg twice daily at home.  We will add low-dose oxycodone at this time due to intractable pain. ? ?History of hypertension. ?On Cardizem drip currently.  Patient is on telmisartan and HCTZ at home.  We will hold telmisartan and HCTZ for now. ? ?Hyperlipidemia ?Continue Lipitor. ? ?History of sleep apnea.  Uses CPAP at home.  Will resume while in the hospital. ? ?History of COPD.  On albuterol and Spiriva.  We will continue with that.  Denies increasing dyspnea cough or shortness of breath. ? ?Morbid obesity.  Would benefit from ongoing weight loss.  Patient stated that she has lost some amount of weight at this time. ? ? ?

## 2022-04-14 NOTE — H&P (Signed)
?[facility] ?History and Physical ? ?[name] AJG:[id number] DOB: [birth year] DOA: 04/14/2022 ? ?Referring physician: ED ? ?PCP: Venita Lick, NP  ? ?Patient is coming from: Home  ? ?Chief Complaint: Back pain  ? ?HPI:  Patient is a 66 years old female with past medical history of chronic back pain, degenerative joint disease, COPD, hyperlipidemia, hypertension, history of sleep apnea presented to the hospital with acute on chronic back pain.  She does have a history of  spinal stenosis and degenerative joint disease with hip arthritis as well.  Patient stated worsening pain and for the last 3 days she has not been able to ambulate much.  At baseline he is he is able to ambulate inside the house.  She follows up with your primary care physician and does not have a pain specialist.  She takes Tylenol for pain including multiple other medications for her back.  She denies bowel and bladder incontinence.  Patient denies any urinary urgency, frequency or dysuria.  Denies any dizziness lightheadedness or syncope.  Denies any chest pain, shortness of breath fever or chills. ? ?In the ED, patient had low blood pressure at 88/ 71 with pulse of 102, highest up to 145.  WBC was slightly elevated at 13.9.  EKG showed atrial fibrillation with rapid ventricular response with no ischemic changes.  Patient was given Cardizem IV bolus and was started on Cardizem drip in the ED including 250 bolus of normal saline.  She also got morphine for pain relief.  CT scan of the abdomen showed a large umbilical hernia with some cholelithiasis without cholecystitis and right adrenal mass of 3.3 cm previously 3.1 cm, likely benign.  Some upper abdominal lymph nodes were also noted as nonspecific.  Hospitalist team was then consulted for observation in the hospital for A-fib with RVR and acute on chronic back pain. ? ? ?Assessment and plan ? ?Principal Problem: ?  Atrial fibrillation with RVR ([place]) ?Active Problems: ?  DDD  (degenerative disc disease), lumbar ?  Hyperlipidemia ?  Chronic pain ?  Spinal stenosis, lumbar region, with neurogenic claudication ?  Benign hypertension ?  Osteoarthritis of right hip ?  Chronic kidney disease, stage 3a ([place]) ?  Back pain ?  ?New onset atrial fibrillation with RVR.   ?Patient was given Cardizem bolus in the ED and is currently on Cardizem drip.  Continue for now.  We will check 2D echocardiogram, TSH, could consider cardiology consultation in AM.  CHA2DS2-VASc score of 3.  Spoke with the patient regarding anticoagulation and is agreeable with Eliquis will will consult pharmacy for this. ? ?Acute on chronic back pain with ambulatory dysfunction.Marland Kitchen   ?Patient does have history of degenerative joint disease spinal stenosis with neurogenic claudication.  We will continue supportive care at this time including physical therapy evaluation, warm compression , muscle relaxants.  Continue BuSpar, Cymbalta, Atarax, gabapentin, baclofen.  Patient states that she takes gabapentin 800 mg twice daily at home.  We will add low-dose oxycodone at this time due to intractable pain. ? ?History of hypertension. ?On Cardizem drip currently.  Patient is on telmisartan and HCTZ at home.  We will hold telmisartan and HCTZ for now. ? ?Hyperlipidemia ?Continue Lipitor. ? ?History of sleep apnea.  Uses CPAP at home.  Will resume while in the hospital. ? ?History of COPD.  On albuterol and Spiriva.  We will continue with that.  Denies increasing dyspnea cough or shortness of breath. ? ?Morbid obesity.  Would benefit from  ongoing weight loss.  Patient stated that she has lost some amount of weight at this time. ? ? ? ?DVT Prophylaxis: Eliquis ? ?Review of Systems:  ?All systems were reviewed and were negative unless otherwise mentioned in the HPI ? ? ?Past Medical History:  ?Diagnosis Date  ? Arthritis   ? COPD (chronic obstructive pulmonary disease) ([place])   ? Enlarged heart   ? GERD (gastroesophageal reflux disease)   ?  Hiatal hernia   ? Hx of degenerative disc disease   ? Hyperlipidemia   ? Hypertension   ? Mass   ? On adrenal gland  ? Monocytosis   ? Osteoporosis   ? Sleep apnea   ? ?Past Surgical History:  ?Procedure Laterality Date  ? ABDOMINAL HYSTERECTOMY    ? ACHILLES TENDON REPAIR    ? Removed bone spur and repaired achilles tendon  ? APPENDECTOMY    ? bone fusion in neck    ? COLONOSCOPY WITH PROPOFOL N/A 01/17/2019  ? Procedure: COLONOSCOPY WITH PROPOFOL;  Surgeon: Jonathon Bellows, MD;  [facility] ENDOSCOPY;  Service: Gastroenterology;  Laterality: N/A;  ? HERNIA REPAIR    ? has not had repair  ? TONSILLECTOMY    ? ? ?Social History:  reports that she has been smoking cigarettes. She has a 48.00 pack-year smoking history. She has never used smokeless tobacco. She reports that she does not drink alcohol and does not use drugs. ? ?No Known Allergies ? ?Family History  ?Problem Relation Age of Onset  ? COPD Father   ? Heart disease Father   ? Hyperlipidemia Father   ? Hypertension Father   ? Dementia Father   ? Cancer Sister   ? Hyperlipidemia Sister   ? Hypertension Sister   ? Lymphoma Sister   ? Migraines Son   ? Stroke Maternal Grandfather   ? Heart attack Brother   ? Cancer Brother   ?     colorectal and liver  ? Breast cancer Paternal Aunt   ? Breast cancer Paternal Aunt   ?  ? ?Prior to Admission medications   ?Medication Sig Start Date End Date Taking? Authorizing Provider  ?acetaminophen (TYLENOL) 500 MG tablet Take 1,000 mg by mouth in the morning and at bedtime.   Yes [provider]  ?albuterol (PROAIR HFA) 108 (90 Base) MCG/ACT inhaler Inhale 2 puffs into the lungs every 6 (six) hours as needed for wheezing or shortness of breath. 01/19/20  Yes Cannady, Henrine Screws T, NP  ?ascorbic acid (VITAMIN C) 500 MG tablet Take 500 mg by mouth daily.   Yes [provider]  ?aspirin 81 MG tablet Take 81 mg by mouth daily.   Yes [provider]  ?atorvastatin (LIPITOR) 80 MG tablet Take 1 tablet (80 mg  total) by mouth daily. 10/27/21  Yes Cannady, Henrine Screws T, NP  ?baclofen (LIORESAL) 10 MG tablet Take 0.5 tablets (5 mg total) by mouth 3 (three) times daily. 01/06/22  Yes Cannady, Jolene T, NP  ?busPIRone (BUSPAR) 10 MG tablet Take 2 tablets (20 mg total) by mouth 3 (three) times daily. 09/03/21  Yes Venita Lick, NP  ?Calcium Citrate-Vitamin D (CALCIUM + D PO) Take 1,000 mg by mouth daily.   Yes [provider]  ?Cholecalciferol (VITAMIN D) 50 MCG (2000 UT) CAPS Take by mouth.   Yes [provider]  ?DULoxetine (CYMBALTA) 60 MG capsule Take 1 capsule (60 mg total) by mouth daily. 09/03/21  Yes Cannady, Henrine Screws T, NP  ?furosemide (LASIX) 20  MG tablet Take 1 tablet (20 mg total) by mouth daily. 09/03/21  Yes Cannady, Henrine Screws T, NP  ?gabapentin (NEURONTIN) 800 MG tablet Take 800 mg by mouth in the morning and at bedtime. 03/09/22  Yes [provider]  ?hydrOXYzine (ATARAX/VISTARIL) 10 MG tablet Take 1 tablet (10 mg total) by mouth 3 (three) times daily as needed. 10/15/21  Yes Cannady, Henrine Screws T, NP  ?loratadine (CLARITIN) 10 MG tablet Take 10 mg by mouth daily.   Yes [provider]  ?Melatonin 10 MG TABS Take 1 tablet by mouth at bedtime.   Yes [provider]  ?Multiple Vitamins-Minerals (MULTIVITAMIN WITH MINERALS) tablet Take 1 tablet by mouth daily.   Yes [provider]  ?omeprazole (PRILOSEC) 40 MG capsule Take 1 capsule (40 mg total) by mouth daily. 09/03/21  Yes Cannady, Jolene T, NP  ?potassium chloride SA (KLOR-CON) 20 MEQ tablet Take 1 tablet (20 mEq total) by mouth daily. 09/03/21  Yes Cannady, Jolene T, NP  ?telmisartan-hydrochlorothiazide (MICARDIS HCT) 40-12.5 MG tablet Take 1 tablet by mouth daily. 09/03/21  Yes Venita Lick, NP  ?Tiotropium Bromide Monohydrate (SPIRIVA RESPIMAT) 2.5 MCG/ACT AERS INHALE 2 SPRAY(S) BY MOUTH ONCE DAILY 12/10/21  Yes Cannady, Jolene T, NP  ?vitamin B-12 (CYANOCOBALAMIN) 1000 MCG tablet Take 1,000 mcg by mouth daily.    Yes [provider]  ?Semaglutide, 1 MG/DOSE, 4 MG/3ML SOPN Inject 1 mg as directed once a week. 12/10/21   Cannady, Henrine Screws T, NP  ?triamcinolone cream (KENALOG) 0.1 % Apply 1 application topically 2 (two

## 2022-04-14 NOTE — ED Triage Notes (Signed)
Pt come with c/o back pain. Pt has hx of DDD. Pt denies any injuries. Pt states last few days pain is worse. ?

## 2022-04-15 ENCOUNTER — Inpatient Hospital Stay
Admit: 2022-04-15 | Discharge: 2022-04-15 | Disposition: A | Discharge status: Home / Self Care | Payer: PPO | Attending: Internal Medicine | Admitting: Internal Medicine

## 2022-04-15 ENCOUNTER — Encounter: Payer: Self-pay | Admitting: Internal Medicine

## 2022-04-15 ENCOUNTER — Inpatient Hospital Stay: Payer: PPO

## 2022-04-15 DIAGNOSIS — R600 Localized edema: Secondary | ICD-10-CM | POA: Diagnosis present

## 2022-04-15 DIAGNOSIS — N1831 Chronic kidney disease, stage 3a: Secondary | ICD-10-CM | POA: Diagnosis present

## 2022-04-15 DIAGNOSIS — K429 Umbilical hernia without obstruction or gangrene: Secondary | ICD-10-CM | POA: Diagnosis present

## 2022-04-15 DIAGNOSIS — F32A Depression, unspecified: Secondary | ICD-10-CM | POA: Diagnosis present

## 2022-04-15 DIAGNOSIS — T461X5A Adverse effect of calcium-channel blockers, initial encounter: Secondary | ICD-10-CM | POA: Diagnosis present

## 2022-04-15 DIAGNOSIS — K219 Gastro-esophageal reflux disease without esophagitis: Secondary | ICD-10-CM | POA: Diagnosis present

## 2022-04-15 DIAGNOSIS — I1310 Hypertensive heart and chronic kidney disease without heart failure, with stage 1 through stage 4 chronic kidney disease, or unspecified chronic kidney disease: Secondary | ICD-10-CM | POA: Diagnosis present

## 2022-04-15 DIAGNOSIS — G4733 Obstructive sleep apnea (adult) (pediatric): Secondary | ICD-10-CM | POA: Diagnosis present

## 2022-04-15 DIAGNOSIS — F419 Anxiety disorder, unspecified: Secondary | ICD-10-CM | POA: Diagnosis present

## 2022-04-15 DIAGNOSIS — M549 Dorsalgia, unspecified: Secondary | ICD-10-CM | POA: Diagnosis not present

## 2022-04-15 DIAGNOSIS — Z6841 Body Mass Index (BMI) 40.0 and over, adult: Secondary | ICD-10-CM | POA: Diagnosis not present

## 2022-04-15 DIAGNOSIS — M47816 Spondylosis without myelopathy or radiculopathy, lumbar region: Secondary | ICD-10-CM | POA: Diagnosis present

## 2022-04-15 DIAGNOSIS — I952 Hypotension due to drugs: Secondary | ICD-10-CM | POA: Diagnosis present

## 2022-04-15 DIAGNOSIS — J449 Chronic obstructive pulmonary disease, unspecified: Secondary | ICD-10-CM | POA: Diagnosis present

## 2022-04-15 DIAGNOSIS — F17210 Nicotine dependence, cigarettes, uncomplicated: Secondary | ICD-10-CM | POA: Diagnosis present

## 2022-04-15 DIAGNOSIS — I4891 Unspecified atrial fibrillation: Secondary | ICD-10-CM | POA: Diagnosis present

## 2022-04-15 DIAGNOSIS — M48062 Spinal stenosis, lumbar region with neurogenic claudication: Secondary | ICD-10-CM | POA: Diagnosis present

## 2022-04-15 DIAGNOSIS — G8929 Other chronic pain: Secondary | ICD-10-CM | POA: Diagnosis present

## 2022-04-15 DIAGNOSIS — R269 Unspecified abnormalities of gait and mobility: Secondary | ICD-10-CM | POA: Diagnosis present

## 2022-04-15 DIAGNOSIS — I10 Essential (primary) hypertension: Secondary | ICD-10-CM | POA: Diagnosis not present

## 2022-04-15 DIAGNOSIS — M1611 Unilateral primary osteoarthritis, right hip: Secondary | ICD-10-CM | POA: Diagnosis present

## 2022-04-15 DIAGNOSIS — K8020 Calculus of gallbladder without cholecystitis without obstruction: Secondary | ICD-10-CM | POA: Diagnosis present

## 2022-04-15 DIAGNOSIS — E785 Hyperlipidemia, unspecified: Secondary | ICD-10-CM | POA: Diagnosis present

## 2022-04-15 DIAGNOSIS — E279 Disorder of adrenal gland, unspecified: Secondary | ICD-10-CM | POA: Diagnosis present

## 2022-04-15 DIAGNOSIS — M5136 Other intervertebral disc degeneration, lumbar region: Secondary | ICD-10-CM | POA: Diagnosis present

## 2022-04-15 LAB — T4, FREE: Free T4: 0.95 ng/dL (ref 0.61–1.12)

## 2022-04-15 LAB — CBC
HCT: 38.7 % (ref 36.0–46.0)
Hemoglobin: 12.5 g/dL (ref 12.0–15.0)
MCH: 28.9 pg (ref 26.0–34.0)
MCHC: 32.3 g/dL (ref 30.0–36.0)
MCV: 89.4 fL (ref 80.0–100.0)
Platelets: 352 10*3/uL (ref 150–400)
RBC: 4.33 MIL/uL (ref 3.87–5.11)
RDW: 15.1 % (ref 11.5–15.5)
WBC: 15.3 10*3/uL — ABNORMAL HIGH (ref 4.0–10.5)
nRBC: 0 % (ref 0.0–0.2)

## 2022-04-15 LAB — ECHOCARDIOGRAM COMPLETE
AR max vel: 3.19 cm2
AV Area VTI: 2.25 cm2
AV Area mean vel: 2.64 cm2
AV Mean grad: 4 mmHg
AV Peak grad: 6.6 mmHg
Ao pk vel: 1.28 m/s
Area-P 1/2: 9.18 cm2
Height: 64 in
MV VTI: 2.71 cm2
S' Lateral: 3.79 cm
Weight: 3851.88 oz

## 2022-04-15 LAB — LIPID PANEL
Cholesterol: 123 mg/dL (ref 0–200)
HDL: 29 mg/dL — ABNORMAL LOW (ref >40)
LDL Cholesterol: 64 mg/dL (ref 0–99)
Total CHOL/HDL Ratio: 4.2 RATIO
Triglycerides: 148 mg/dL (ref <150)
VLDL: 30 mg/dL (ref 0–40)

## 2022-04-15 LAB — URINALYSIS, ROUTINE W REFLEX MICROSCOPIC
Bilirubin Urine: NEGATIVE
Glucose, UA: NEGATIVE mg/dL
Hgb urine dipstick: NEGATIVE
Ketones, ur: NEGATIVE mg/dL
Nitrite: NEGATIVE
Protein, ur: NEGATIVE mg/dL
Specific Gravity, Urine: 1.019 (ref 1.005–1.030)
pH: 5 (ref 5.0–8.0)

## 2022-04-15 LAB — BASIC METABOLIC PANEL
Anion gap: 9 (ref 5–15)
BUN: 28 mg/dL — ABNORMAL HIGH (ref 8–23)
CO2: 22 mmol/L (ref 22–32)
Calcium: 9.1 mg/dL (ref 8.9–10.3)
Chloride: 106 mmol/L (ref 98–111)
Creatinine, Ser: 1.19 mg/dL — ABNORMAL HIGH (ref 0.44–1.00)
GFR, Estimated: 50 mL/min — ABNORMAL LOW (ref >60)
Glucose, Bld: 95 mg/dL (ref 70–99)
Potassium: 4.2 mmol/L (ref 3.5–5.1)
Sodium: 137 mmol/L (ref 135–145)

## 2022-04-15 LAB — GLUCOSE, CAPILLARY: Glucose-Capillary: 97 mg/dL (ref 70–99)

## 2022-04-15 LAB — MAGNESIUM
Magnesium: 2.1 mg/dL (ref 1.7–2.4)
Magnesium: 2.1 mg/dL (ref 1.7–2.4)

## 2022-04-15 LAB — TSH: TSH: 1.952 u[IU]/mL (ref 0.350–4.500)

## 2022-04-15 LAB — HIV ANTIBODY (ROUTINE TESTING W REFLEX): HIV Screen 4th Generation wRfx: NONREACTIVE

## 2022-04-15 MED ORDER — LORAZEPAM 2 MG/ML IJ SOLN
1.0000 mg | Freq: Once | INTRAMUSCULAR | Status: AC
  Administered 2022-04-15: 1 mg via INTRAVENOUS
  Filled 2022-04-15: qty 1

## 2022-04-15 MED ORDER — VITAMIN B-12 1000 MCG PO TABS
1000.0000 ug | ORAL_TABLET | Freq: Every day | ORAL | Status: DC
  Administered 2022-04-15 – 2022-04-17 (×3): 1000 ug via ORAL
  Filled 2022-04-15 (×3): qty 1

## 2022-04-15 MED ORDER — TRAMADOL HCL 50 MG PO TABS
50.0000 mg | ORAL_TABLET | Freq: Four times a day (QID) | ORAL | Status: DC | PRN
  Administered 2022-04-16 – 2022-04-17 (×3): 50 mg via ORAL
  Filled 2022-04-15 (×3): qty 1

## 2022-04-15 MED ORDER — FUROSEMIDE 20 MG PO TABS
20.0000 mg | ORAL_TABLET | Freq: Every day | ORAL | Status: DC
  Administered 2022-04-15 – 2022-04-17 (×3): 20 mg via ORAL
  Filled 2022-04-15 (×3): qty 1

## 2022-04-15 MED ORDER — METOPROLOL TARTRATE 25 MG PO TABS
12.5000 mg | ORAL_TABLET | Freq: Three times a day (TID) | ORAL | Status: DC
  Administered 2022-04-15 – 2022-04-16 (×3): 12.5 mg via ORAL
  Filled 2022-04-15 (×3): qty 1

## 2022-04-15 MED ORDER — IOHEXOL 300 MG/ML  SOLN
75.0000 mL | Freq: Once | INTRAMUSCULAR | Status: AC | PRN
  Administered 2022-04-15: 75 mL via INTRAVENOUS

## 2022-04-15 MED ORDER — ADULT MULTIVITAMIN W/MINERALS CH
1.0000 | ORAL_TABLET | Freq: Every day | ORAL | Status: DC
  Administered 2022-04-15 – 2022-04-17 (×3): 1 via ORAL
  Filled 2022-04-15 (×3): qty 1

## 2022-04-15 MED ORDER — MORPHINE SULFATE (PF) 2 MG/ML IV SOLN
1.0000 mg | Freq: Four times a day (QID) | INTRAVENOUS | Status: DC | PRN
  Administered 2022-04-16 – 2022-04-17 (×2): 1 mg via INTRAVENOUS
  Filled 2022-04-15 (×3): qty 1

## 2022-04-15 MED ORDER — GUAIFENESIN-DM 100-10 MG/5ML PO SYRP
5.0000 mL | ORAL_SOLUTION | ORAL | Status: DC | PRN
  Administered 2022-04-15: 5 mL via ORAL
  Filled 2022-04-15: qty 10

## 2022-04-15 MED ORDER — OYSTER SHELL CALCIUM/D3 500-5 MG-MCG PO TABS
1.0000 | ORAL_TABLET | Freq: Every day | ORAL | Status: DC
  Administered 2022-04-15 – 2022-04-17 (×3): 1 via ORAL
  Filled 2022-04-15 (×3): qty 1

## 2022-04-15 MED ORDER — GABAPENTIN 800 MG PO TABS: 800.0000 mg | ORAL_TABLET | Freq: Two times a day (BID) | ORAL | Status: DC

## 2022-04-15 MED ORDER — METOPROLOL TARTRATE 5 MG/5ML IV SOLN
5.0000 mg | Freq: Once | INTRAVENOUS | Status: AC
  Administered 2022-04-15: 5 mg via INTRAVENOUS
  Filled 2022-04-15: qty 5

## 2022-04-15 NOTE — Progress Notes (Signed)
Patient c/o of abdomin pain at her hernia site amd c/o of feeling very anxious. Patient in Afib and HR staying in 158-164. No SOB noted. Provider notified. Cardiology was consulted. See MAR for the medication given.  ?

## 2022-04-15 NOTE — Progress Notes (Signed)
*  PRELIMINARY RESULTS* ?Echocardiogram ?2D Echocardiogram has been performed. ? ?[name] Char Bharath Bernstein ?04/15/2022, 1:08 PM ?

## 2022-04-15 NOTE — Progress Notes (Signed)
[place] [facility] ? ? ?PATIENT NAME: [name]   ? ?MR#:  [id number] ? ?DATE OF BIRTH:  1956/09/14 ? ?SUBJECTIVE:  ? ? ? ? ?VITALS:  ?Blood pressure 134/76, pulse (!) 133, temperature 98.1 ?F (36.7 ?C), temperature source Oral, resp. rate 18, height '5\' 4"'$  (1.626 m), weight 109.2 kg, SpO2 98 %. ? ?PHYSICAL EXAMINATION:  ? ?GENERAL:  66 y.o.-year-old patient lying in the bed with no acute distress.  ?LUNGS: Normal breath sounds bilaterally, no wheezing, rales, rhonchi.  ?CARDIOVASCULAR: S1, S2 normal. No murmurs, rubs, or gallops.  ?ABDOMEN: Soft, nontender, nondistended. Bowel sounds present.  ?EXTREMITIES: No  edema b/l.    ?NEUROLOGIC: nonfocal  patient is alert and awake ?SKIN: No obvious rash, lesion, or ulcer.  ? ?LABORATORY PANEL:  ?CBC ?Recent Labs  ?Lab 04/15/22 ?3532  ?WBC 15.3*  ?HGB 12.5  ?HCT 38.7  ?PLT 352  ? ? ?Chemistries  ?Recent Labs  ?Lab 04/14/22 ?1454 04/14/22 ?2350 04/15/22 ?9924  ?NA 140  --  137  ?K 4.9  --  4.2  ?CL 107  --  106  ?CO2 23  --  22  ?GLUCOSE 115*  --  95  ?BUN 36*  --  28*  ?CREATININE 1.37*  --  1.19*  ?CALCIUM 9.6  --  9.1  ?MG  --    < > 2.1  ?AST 26  --   --   ?ALT 22  --   --   ?ALKPHOS 151*  --   --   ?BILITOT 0.7  --   --   ? < > = values in this interval not displayed.  ? ?Cardiac Enzymes ?No results for input(s): TROPONINI in the last 168 hours. ?RADIOLOGY:  ?CT LUMBAR SPINE W CONTRAST ? ?Result Date: 04/15/2022 ?CLINICAL DATA:  Chronic back pain, spinal stenosis, hypotension EXAM: CT LUMBAR SPINE WITH CONTRAST TECHNIQUE: Multidetector CT imaging of the lumbar spine was performed with intravenous contrast administration. RADIATION DOSE REDUCTION: This exam was performed according to the departmental dose-optimization program which includes automated exposure control, adjustment of the mA and/or kV according to patient size and/or use of iterative reconstruction technique. CONTRAST:  21m OMNIPAQUE IOHEXOL 300 MG/ML  SOLN COMPARISON:   CT from previous day FINDINGS: Segmentation: 5 non rib-bearing lumbar segments assigned L1-L5. Alignment: Normal Vertebrae: No acute fracture or focal pathologic process. Paraspinal and other soft tissues: Moderate calcified aortoiliac atheromatous plaque. Stable right adrenal mass with coarse calcifications. Distended urinary bladder. Remainder visualized paraspinal soft tissues unremarkable. No areas of unexpected enhancement after IV contrast administration. Disc levels: T11-12: Moderate narrowing of the interspace with discogenic sclerosis in the adjacent vertebral bodies, and endplate spurring. T12-L1: Minimal anterior endplate spurring LQ6-8 Unremarkable L2-3: Mild disc bulge.  Early left facet DJD. L3-4: Mild disc bulge.  Early facet DJD left greater than right. L4-5: Moderate narrowing views face with moderate vacuum phenomenon, and discogenic sclerosis in the adjacent vertebral bodies. Broad posterior disc bulge with osteophytic ridging. Bilateral facet DJD contributing to foraminal encroachment right greater than left. L5-S1: Moderate bilateral facet DJD. No severe osseous foraminal stenosis. IMPRESSION: 1. No acute findings. 2. 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 3. Bilateral foraminal encroachment L4-5 right worse than left. 4.  Aortic Atherosclerosis (ICD10-170.0).  For Electronically Signed   By: DLucrezia EuropeM.D.   On: 04/15/2022 11:45  ? ?CT ABDOMEN PELVIS W CONTRAST ? ?Result Date: 04/14/2022 ?CLINICAL DATA:  Abdominal  and back pain, hypotension, tachycardia, EXAM: CT ABDOMEN AND PELVIS WITH CONTRAST TECHNIQUE: Multidetector CT imaging of the abdomen and pelvis was performed using the standard protocol following bolus administration of intravenous contrast. RADIATION DOSE REDUCTION: This exam was performed according to the departmental dose-optimization program which includes automated exposure control, adjustment of the mA and/or kV according to patient size  and/or use of iterative reconstruction technique. CONTRAST:  71m OMNIPAQUE IOHEXOL 300 MG/ML SOLN are IV. No oral contrast. COMPARISON:  08/12/2015 FINDINGS: Lower chest: Lung bases clear Hepatobiliary: 12 mm gallstone within gallbladder. No gallbladder wall thickening or biliary dilatation. Liver normal appearance. Pancreas: Normal appearance Spleen: Normal appearance Adrenals/Urinary Tract: LEFT adrenal gland normal appearance. RIGHT adrenal mass 3.3 cm greatest diameter, previously 3.1 cm, 38 HU on portal venous phase imaging, 26 HU on delayed images now containing calcifications. Kidneys, ureters, and bladder normal appearance. Stomach/Bowel: Transverse colon and small bowel loops extend to a large umbilical hernia. Appendix surgically absent by history. Diverticulosis of sigmoid colon. Stomach and bowel loops otherwise normal appearance. No evidence of bowel wall thickening or obstruction. Vascular/Lymphatic: Atherosclerotic calcifications aorta iliac arteries without aneurysm. Periportal and celiac axis adenopathy including 15 mm portal caval node image 17, 11 mm celiac node image 17, 16 mm portacaval node image 23, 12 mm gastrohepatic ligament node image 13. These appear new or increased in sizes versus previous exam. Reproductive: Uterus surgically absent.  Atrophic ovaries. Other: No free air or free fluid.  No inflammatory process. Musculoskeletal: Degenerative disc disease changes thoracolumbar spine with degenerative changes of RIGHT hip joint as well. IMPRESSION: Large umbilical hernia containing nonobstructed transverse colon and small bowel loops. Cholelithiasis without evidence of cholecystitis. Sigmoid diverticulosis without evidence of diverticulitis. RIGHT adrenal mass 3.3 cm greatest diameter, previously 3.1 cm; stability for over 6 years is indicative of a probably benign lesion and no follow-up imaging is recommended. New or increased sizes of upper abdominal lymph nodes, nonspecific, could be  reactive in the setting of prior infection or inflammation, or due to either lymphoma or metastatic disease. Consider follow-up PET-CT imaging to assess metabolic activity of these nodes; if CT is not performed, would perform short-term follow-up CT in 3 months to reassess. Aortic Atherosclerosis (ICD10-I70.0). Electronically Signed   By: MLavonia DanaM.D.   On: 04/14/2022 15:49  ? ?ECHOCARDIOGRAM COMPLETE ? ?Result Date: 04/15/2022 ?   ECHOCARDIOGRAM REPORT   Patient Name:   Kathleen DOUBRAVADate of Exam: 04/15/2022 Medical Rec #:  [id number]      Height:       64.0 in Accession #:    [id number]     Weight:       240.7 lb Date of Birth:  [birth year]      BSA:          2.117 m? Patient Age:    89years        BP:           119/73 mmHg Patient Gender: F               HR:           150 bpm. Exam Location:  [facility] Procedure: 2D Echo, Color Doppler and Cardiac Doppler Indications:     I48.91 Atrial fibrillation  History:         Patient has prior history of Echocardiogram examinations. COPD;                  Risk Factors:Hypertension, Dyslipidemia and  Sleep Apnea.  Sonographer:     Charmayne Sheer Referring Phys:  [id number] Murphy Diagnosing Phys: Yolonda Kida MD  Sonographer Comments: Technically difficult study due to poor echo windows and suboptimal apical window. Image acquisition challenging due to COPD and due to tachycardia throughout study. IMPRESSIONS  1. Left ventricular ejection fraction, by estimation, is 55 to 60%. The left ventricle has normal function. The left ventricle has no regional wall motion abnormalities. Left ventricular diastolic parameters were normal.  2. Right ventricular systolic function is normal. The right ventricular size is normal.  3. The mitral valve is normal in structure. Trivial mitral valve regurgitation.  4. The aortic valve is grossly normal. Aortic valve regurgitation is not visualized. Conclusion(s)/Recommendation(s): Poor windows for evaluation of left ventricular function by  transthoracic echocardiography. Would recommend an alternative means of evaluation. FINDINGS  Left Ventricle: Left ventricular ejection fraction, by estimation, is 55 to 60%. The left ventricle has

## 2022-04-15 NOTE — Progress Notes (Addendum)
Pt HR is at 114 tfor a second then jump back up between 120's to 140's sustained. Pt also complaints of 10/10 back pain.Pt amiodarone drip rate at 30 mg/hr. Pt BP at 121/92 MAP 99 HR 132. NP Foust made aware. Will continue to monitor. ? ?Update 0416: NP Foust came bedside and placed order. Will continue to monitor. ?

## 2022-04-15 NOTE — Progress Notes (Signed)
Attempted to place pt on CPAP and pt stated she could not wear this. ?

## 2022-04-15 NOTE — Progress Notes (Signed)
PT Cancellation Note ? ?Patient Details ?Name: [name] ?MRN: [id number] ?DOB: Mar 18, 1956 ? ? ?Cancelled Treatment:    Reason Eval/Treat Not Completed: Other (comment). Consult received and chart reviewed. Pt currently with elevated HR (150s bpm) and low BP (98/63). Of note, started amiodarone last night. Discussed with care team. Will hold off on PT at this time and wait until cleared from cardiac perspective. Will follow. ? ? ?Ashlei Chinchilla ?04/15/2022, 9:02 AM ?Greggory Stallion, PT, DPT, GCS ?[phone] ? ?

## 2022-04-15 NOTE — Consult Note (Signed)
[facility] NOTE       Patient ID: [name] MRN: [id number] DOB/AGE: [birth year] 66 y.o.  Admit date: 04/14/2022 Referring Physician Dr. Fritzi Mandes Primary Physician Marnee Guarneri, NP Primary Cardiologist Dr. Ubaldo Glassing (last seen in 2017) Reason for Consultation new AF with RVR  HPI: Crimson Dubberly is a 66yoF with a PMH of degenerative joint disease, chronic back pain, COPD with ongoing tobacco use, hypertension, hyperlipidemia, obesity, CKD 3, GERD, anxiety and depression, who presented with [facility] ED 04/14/22 with back pain that was more severe than usual x 3 days, limiting her ability to ambulate. She was found to be in AF with RVR, new onset. Cardiology is consulted for further assistance.   The patient had 3 days of worsening of her chronic lower back pain before she presented to the ED.  She said all she did was get up and her back started hurting severely.  On Sunday she says she was sitting at rest and had some chest discomfort underneath both of her breasts and in her neck that lasted all day long without exertional worsening, radiation, associated diaphoresis or nausea.  She said the sensation went away on its own and has not reoccurred.  She was found to be in A-fib with RVR on admission and was initially placed on diltiazem gtt. but became hypotensive so the drip was discontinued.  She was then started on amiodarone gtt. and heart rates have been difficult to control throughout the day.  At my time of evaluation in the afternoon she is sitting upright, attempting to eat lunch.  She was feeling anxious earlier in the day, but denies further chest pain, shortness of breath, palpitations, dizziness, lower extremity edema.  She is a current 1 pack/day smoker with a 52-pack-year history.  Does not drink alcohol.  Family history of atrial fibrillation in both of her brothers.  She thinks she has a remote history of a heart catheterization many years ago performed at [facility]  by Dr. Clayborn Bigness but I am unable to find these records.  She denies prior stent placement and is unsure why they did the heart catheterization to begin with.  She saw Dr. Ubaldo Glassing in September 2017 for atypical chest pain, shortness of breath and peripheral edema and follow-up of a treadmill stress test which resulted negative, LV function 73% without reversible ischemia.  She was lost to follow-up thereafter.  Labs are notable for potassium of 4.2, magnesium 2.1 creatinine 1.19, EGFR 50.  Total cholesterol 123, HDL 29, LDL 64, triglycerides 148, VLDL 30, WBCs slightly elevated at 15, H&H stable at 12/38 with platelets 352.  UA with large leukocytes and rare bacteria present.  Vitals are notable for a recent blood pressure of 134/76, heart rate on telemetry in the 130s-160s during interview.  She is saturating well on 2 L by nasal cannula  Review of systems complete and found to be negative unless listed above     Past Medical History:  Diagnosis Date   Arthritis    COPD (chronic obstructive pulmonary disease) ([place])    Enlarged heart    GERD (gastroesophageal reflux disease)    Hiatal hernia    Hx of degenerative disc disease    Hyperlipidemia    Hypertension    Mass    On adrenal gland   Monocytosis    Osteoporosis    Sleep apnea     Past Surgical History:  Procedure Laterality Date   ABDOMINAL HYSTERECTOMY     ACHILLES TENDON  REPAIR     Removed bone spur and repaired achilles tendon   APPENDECTOMY     bone fusion in neck     COLONOSCOPY WITH PROPOFOL N/A 01/17/2019   Procedure: COLONOSCOPY WITH PROPOFOL;  Surgeon: Jonathon Bellows, MD;  Location: [facility] ENDOSCOPY;  Service: Gastroenterology;  Laterality: N/A;   HERNIA REPAIR     has not had repair   TONSILLECTOMY      Medications Prior to Admission  Medication Sig Dispense Refill Last Dose   acetaminophen (TYLENOL) 500 MG tablet Take 1,000 mg by mouth in the morning and at bedtime.   04/13/2022   albuterol (PROAIR HFA) 108 (90 Base)  MCG/ACT inhaler Inhale 2 puffs into the lungs every 6 (six) hours as needed for wheezing or shortness of breath. 8.5 g 5 Past Month   ascorbic acid (VITAMIN C) 500 MG tablet Take 500 mg by mouth daily.   04/14/2022   aspirin 81 MG tablet Take 81 mg by mouth daily.   04/14/2022   atorvastatin (LIPITOR) 80 MG tablet Take 1 tablet (80 mg total) by mouth daily. 90 tablet 4 04/13/2022   baclofen (LIORESAL) 10 MG tablet Take 0.5 tablets (5 mg total) by mouth 3 (three) times daily. 135 each 4 04/14/2022   busPIRone (BUSPAR) 10 MG tablet Take 2 tablets (20 mg total) by mouth 3 (three) times daily. 180 tablet 12 04/14/2022   Calcium Citrate-Vitamin D (CALCIUM + D PO) Take 1,000 mg by mouth daily.   04/14/2022   Cholecalciferol (VITAMIN D) 50 MCG (2000 UT) CAPS Take by mouth.   04/14/2022   DULoxetine (CYMBALTA) 60 MG capsule Take 1 capsule (60 mg total) by mouth daily. 90 capsule 4 04/14/2022   furosemide (LASIX) 20 MG tablet Take 1 tablet (20 mg total) by mouth daily. 90 tablet 4 04/14/2022   gabapentin (NEURONTIN) 800 MG tablet Take 800 mg by mouth in the morning and at bedtime.   04/14/2022   hydrOXYzine (ATARAX/VISTARIL) 10 MG tablet Take 1 tablet (10 mg total) by mouth 3 (three) times daily as needed. 270 tablet 4 04/14/2022   loratadine (CLARITIN) 10 MG tablet Take 10 mg by mouth daily.   Past Month   Melatonin 10 MG TABS Take 1 tablet by mouth at bedtime.   04/13/2022   Multiple Vitamins-Minerals (MULTIVITAMIN WITH MINERALS) tablet Take 1 tablet by mouth daily.   04/14/2022   omeprazole (PRILOSEC) 40 MG capsule Take 1 capsule (40 mg total) by mouth daily. 90 capsule 4 04/14/2022   potassium chloride SA (KLOR-CON) 20 MEQ tablet Take 1 tablet (20 mEq total) by mouth daily. 90 tablet 4 04/14/2022   telmisartan-hydrochlorothiazide (MICARDIS HCT) 40-12.5 MG tablet Take 1 tablet by mouth daily. 90 tablet 4 04/14/2022   Tiotropium Bromide Monohydrate (SPIRIVA RESPIMAT) 2.5 MCG/ACT AERS INHALE 2 SPRAY(S) BY MOUTH ONCE DAILY  4 g 5 04/13/2022   vitamin B-12 (CYANOCOBALAMIN) 1000 MCG tablet Take 1,000 mcg by mouth daily.   04/14/2022   Semaglutide, 1 MG/DOSE, 4 MG/3ML SOPN Inject 1 mg as directed once a week. 9 mL 4 04/08/2022   triamcinolone cream (KENALOG) 0.1 % Apply 1 application topically 2 (two) times daily. 30 g 0    Social History   Socioeconomic History   Marital status: Widowed    Spouse name: Not on file   Number of children: Not on file   Years of education: Not on file   Highest education level: Not on file  Occupational History   Occupation: disabled  Tobacco  Use   Smoking status: Every Day    Packs/day: 1.00    Years: 48.00    Pack years: 48.00    Types: Cigarettes   Smokeless tobacco: Never   Tobacco comments:    pt states she plans to begin using patch to quit smoking  Vaping Use   Vaping Use: Never used  Substance and Sexual Activity   Alcohol use: No    Alcohol/week: 0.0 standard drinks   Drug use: No   Sexual activity: Never    Partners: Male  Other Topics Concern   Not on file  Social History Narrative   Not on file   Social Determinants of Health   Financial Resource Strain: Low Risk    Difficulty of Paying Living Expenses: Not very hard  Food Insecurity: No Food Insecurity   Worried About Charity fundraiser in the Last Year: Never true   Ran Out of Food in the Last Year: Never true  Transportation Needs: No Transportation Needs   Lack of Transportation (Medical): No   Lack of Transportation (Non-Medical): No  Physical Activity: Inactive   Days of Exercise per Week: 0 days   Minutes of Exercise per Session: 0 min  Stress: No Stress Concern Present   Feeling of Stress : Only a little  Social Connections: Socially Isolated   Frequency of Communication with Friends and Family: More than three times a week   Frequency of Social Gatherings with Friends and Family: More than three times a week   Attends Religious Services: Never   Marine scientist or  Organizations: No   Attends Archivist Meetings: Never   Marital Status: Widowed  Human resources officer Violence: Not At Risk   Fear of Current or Ex-Partner: No   Emotionally Abused: No   Physically Abused: No   Sexually Abused: No    Family History  Problem Relation Age of Onset   COPD Father    Heart disease Father    Hyperlipidemia Father    Hypertension Father    Dementia Father    Cancer Sister    Hyperlipidemia Sister    Hypertension Sister    Lymphoma Sister    Migraines Son    Stroke Maternal Grandfather    Heart attack Brother    Cancer Brother        colorectal and liver   Breast cancer Paternal Aunt    Breast cancer Paternal Aunt       PHYSICAL EXAM General: Chronically ill-appearing Caucasian female appearing older than stated age, well nourished, in no acute distress.  Sitting upright in hospital bed eating lunch.  Resting tremor to right upper extremity. HEENT:  Normocephalic and atraumatic. Neck:  No JVD.  Lungs: Normal respiratory effort on oxygen by nasal cannula. Clear bilaterally to auscultation. No wheezes, crackles, rhonchi.  Heart: tachy irregular rate and rhythm. Normal S1 and S2 without gallops or murmurs. Radial & DP pulses 2+ bilaterally. Abdomen: Obese appearing with ventral hernia present.  Msk: Normal strength and tone for age. Extremities: Warm and well perfused. No clubbing, cyanosis.  No peripheral edema.  Neuro: Alert and oriented X 3. Psych:  Answers questions appropriately.   Labs:   Lab Results  Component Value Date   WBC 15.3 (H) 04/15/2022   HGB 12.5 04/15/2022   HCT 38.7 04/15/2022   MCV 89.4 04/15/2022   PLT 352 04/15/2022    Recent Labs  Lab 04/14/22 1454 04/15/22 0648  NA 140 137  K 4.9  4.2  CL 107 106  CO2 23 22  BUN 36* 28*  CREATININE 1.37* 1.19*  CALCIUM 9.6 9.1  PROT 6.9  --   BILITOT 0.7  --   ALKPHOS 151*  --   ALT 22  --   AST 26  --   GLUCOSE 115* 95   No results found for: CKTOTAL, CKMB,  CKMBINDEX, TROPONINI  Lab Results  Component Value Date   CHOL 123 04/15/2022   CHOL 150 12/10/2021   CHOL 179 09/03/2021   Lab Results  Component Value Date   HDL 29 (L) 04/15/2022   HDL 47 12/10/2021   HDL 50 09/03/2021   Lab Results  Component Value Date   LDLCALC 64 04/15/2022   LDLCALC 78 12/10/2021   LDLCALC 101 (H) 09/03/2021   Lab Results  Component Value Date   TRIG 148 04/15/2022   TRIG 142 12/10/2021   TRIG 160 (H) 09/03/2021   Lab Results  Component Value Date   CHOLHDL 4.2 04/15/2022   No results found for: LDLDIRECT    Radiology: CT LUMBAR SPINE W CONTRAST  Result Date: 04/15/2022 CLINICAL DATA:  Chronic back pain, spinal stenosis, hypotension EXAM: CT LUMBAR SPINE WITH CONTRAST TECHNIQUE: Multidetector CT imaging of the lumbar spine was performed with intravenous contrast administration. RADIATION DOSE REDUCTION: This exam was performed according to the departmental dose-optimization program which includes automated exposure control, adjustment of the mA and/or kV according to patient size and/or use of iterative reconstruction technique. CONTRAST:  68m OMNIPAQUE IOHEXOL 300 MG/ML  SOLN COMPARISON:  CT from previous day FINDINGS: Segmentation: 5 non rib-bearing lumbar segments assigned L1-L5. Alignment: Normal Vertebrae: No acute fracture or focal pathologic process. Paraspinal and other soft tissues: Moderate calcified aortoiliac atheromatous plaque. Stable right adrenal mass with coarse calcifications. Distended urinary bladder. Remainder visualized paraspinal soft tissues unremarkable. No areas of unexpected enhancement after IV contrast administration. Disc levels: T11-12: Moderate narrowing of the interspace with discogenic sclerosis in the adjacent vertebral bodies, and endplate spurring. T12-L1: Minimal anterior endplate spurring LV7-7 Unremarkable L2-3: Mild disc bulge.  Early left facet DJD. L3-4: Mild disc bulge.  Early facet DJD left greater than right.  L4-5: Moderate narrowing views face with moderate vacuum phenomenon, and discogenic sclerosis in the adjacent vertebral bodies. Broad posterior disc bulge with osteophytic ridging. Bilateral facet DJD contributing to foraminal encroachment right greater than left. L5-S1: Moderate bilateral facet DJD. No severe osseous foraminal stenosis. IMPRESSION: 1. No acute findings. 2. 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 3. Bilateral foraminal encroachment L4-5 right worse than left. 4.  Aortic Atherosclerosis (ICD10-170.0).  For Electronically Signed   By: DLucrezia EuropeM.D.   On: 04/15/2022 11:45   CT ABDOMEN PELVIS W CONTRAST  Result Date: 04/14/2022 CLINICAL DATA:  Abdominal and back pain, hypotension, tachycardia, EXAM: CT ABDOMEN AND PELVIS WITH CONTRAST TECHNIQUE: Multidetector CT imaging of the abdomen and pelvis was performed using the standard protocol following bolus administration of intravenous contrast. RADIATION DOSE REDUCTION: This exam was performed according to the departmental dose-optimization program which includes automated exposure control, adjustment of the mA and/or kV according to patient size and/or use of iterative reconstruction technique. CONTRAST:  840mOMNIPAQUE IOHEXOL 300 MG/ML SOLN are IV. No oral contrast. COMPARISON:  08/12/2015 FINDINGS: Lower chest: Lung bases clear Hepatobiliary: 12 mm gallstone within gallbladder. No gallbladder wall thickening or biliary dilatation. Liver normal appearance. Pancreas: Normal appearance Spleen: Normal appearance Adrenals/Urinary Tract: LEFT adrenal gland normal appearance.  RIGHT adrenal mass 3.3 cm greatest diameter, previously 3.1 cm, 38 HU on portal venous phase imaging, 26 HU on delayed images now containing calcifications. Kidneys, ureters, and bladder normal appearance. Stomach/Bowel: Transverse colon and small bowel loops extend to a large umbilical hernia. Appendix surgically absent by history.  Diverticulosis of sigmoid colon. Stomach and bowel loops otherwise normal appearance. No evidence of bowel wall thickening or obstruction. Vascular/Lymphatic: Atherosclerotic calcifications aorta iliac arteries without aneurysm. Periportal and celiac axis adenopathy including 15 mm portal caval node image 17, 11 mm celiac node image 17, 16 mm portacaval node image 23, 12 mm gastrohepatic ligament node image 13. These appear new or increased in sizes versus previous exam. Reproductive: Uterus surgically absent.  Atrophic ovaries. Other: No free air or free fluid.  No inflammatory process. Musculoskeletal: Degenerative disc disease changes thoracolumbar spine with degenerative changes of RIGHT hip joint as well. IMPRESSION: Large umbilical hernia containing nonobstructed transverse colon and small bowel loops. Cholelithiasis without evidence of cholecystitis. Sigmoid diverticulosis without evidence of diverticulitis. RIGHT adrenal mass 3.3 cm greatest diameter, previously 3.1 cm; stability for over 6 years is indicative of a probably benign lesion and no follow-up imaging is recommended. New or increased sizes of upper abdominal lymph nodes, nonspecific, could be reactive in the setting of prior infection or inflammation, or due to either lymphoma or metastatic disease. Consider follow-up PET-CT imaging to assess metabolic activity of these nodes; if CT is not performed, would perform short-term follow-up CT in 3 months to reassess. Aortic Atherosclerosis (ICD10-I70.0). Electronically Signed   By: Lavonia Dana M.D.   On: 04/14/2022 15:49    ECHO 12/2015 LV EF: 60% -   65%   -------------------------------------------------------------------  Indications:      Dyspnea 786.09.   -------------------------------------------------------------------  Study Conclusions   - Procedure narrative: Transthoracic echocardiography. The study    was technically difficult.  - Left ventricle: The cavity size was normal.  Systolic function was    normal. The estimated ejection fraction was in the range of 60%    to 65%.  - Aortic valve: Valve area (Vmax): 2.66 cm^2.   Transthoracic echocardiography.  M-mode, complete 2D, spectral  Doppler, and color Doppler.  Birthdate:  Patient birthdate:  1956/01/07.  Age:  Patient is 66 yr old.  Sex:  Gender: female.  BMI: 47.3 kg/m^2.  Patient status:  Outpatient.  Study date:  Study  date: 12/26/2015. Study time: 11:06 AM.   -------------------------------------------------------------------   -------------------------------------------------------------------  Left ventricle:  The cavity size was normal. Systolic function was  normal. The estimated ejection fraction was in the range of 60% to  65%.   -------------------------------------------------------------------  Aortic valve:   Structurally normal valve.   Cusp separation was  normal.  Doppler:  Transvalvular velocity was within the normal  range. There was no stenosis. There was no regurgitation.    Peak  velocity ratio of LVOT to aortic valve: 0.77. Valve area (Vmax):  2.66 cm^2. Indexed valve area (Vmax): 1.11 cm^2/m^2.    Peak  gradient (S): 9 mm Hg.   -------------------------------------------------------------------  Aorta:  The aorta was normal, not dilated, and non-diseased.   -------------------------------------------------------------------  Mitral valve:   Structurally normal valve.   Leaflet separation was  normal.  Doppler:  Transvalvular velocity was within the normal  range. There was no evidence for stenosis. There was no  regurgitation.   -------------------------------------------------------------------  Left atrium:  The atrium was normal in size.   -------------------------------------------------------------------  Right ventricle:  The cavity size was  normal. Wall thickness was  normal. Systolic function was normal.    -------------------------------------------------------------------  Tricuspid valve:   Doppler:  There was trivial regurgitation.     -------------------------------------------------------------------  Right atrium:  The atrium was normal in size.     -------------------------------------------------------------------  Post procedure conclusions  Ascending Aorta:   - The aorta was normal, not dilated, and non-diseased.   TELEMETRY reviewed by me: A-fib with RVR, rates between 130-160s  EKG reviewed by me: A-fib rate 134  ASSESSMENT AND PLAN:  Morgin Halls is a 25yoF with a PMH of degenerative joint disease, chronic back pain, COPD with ongoing tobacco use, hypertension, hyperlipidemia, obesity, CKD 3, GERD, anxiety and depression, who presented with [facility] ED 04/14/22 with back pain that was more severe than usual x 3 days, limiting her ability to ambulate. She was found to be in AF with RVR, new onset. Cardiology is consulted for further assistance.   #New onset atrial fibrillation with RVR #Acute on chronic low back pain Patient presents with a 3-day history of worsening atraumatic lower back pain from her baseline and was found to be in A-fib with RVR on admission.  She was initially started on a diltiazem drip but it had to be discontinued due to hypotension and is now on amiodarone drip with difficult to control heart rates.  Her electrolytes and thyroid panel are within normal limits.  -Continue pain management and other etiologies of increased adrenergic tone -S/p diltiazem gtt., discontinued due to hypotension -Continue amiodarone infusion for 24 hours then likely transition to p.o. dosing -Give IV metoprolol 5 mg x 1, then continue 12.5 mg 3 times daily for rate control -CHA2DS2-VASc 3, was started on Eliquis 5 mg twice daily for stroke risk reduction -Echocardiogram complete performed this morning, read pending  #Hypertension #Hyperlipidemia Continue home atorvastatin,  Lasix 20 mg daily for peripheral edema  #COPD #Tobacco use Recommend complete cessation from smoking, patient has tried in the past to quit and is motivated to try again.  #OSA -Continue CPAP nightly  This patient's plan of care was discussed and created with Dr. Clayborn Bigness and he is in agreement.  Signed: Tristan Schroeder , PA-C 04/15/2022, 12:42 PM [facility] Card[facility]

## 2022-04-15 NOTE — Progress Notes (Signed)
?   04/14/22 2114  ?Assess: MEWS Score  ?BP (!) 128/54  ?Pulse Rate 95  ?SpO2 95 %  ?Assess: MEWS Score  ?MEWS Temp 0  ?MEWS Systolic 0  ?MEWS Pulse 0  ?MEWS RR 0  ?MEWS LOC 0  ?MEWS Score 0  ?MEWS Score Color Green  ?Assess: if the MEWS score is Yellow or Red  ?Were vital signs taken at a resting state? Yes  ?Focused Assessment No change from prior assessment  ?Does the patient meet 2 or more of the SIRS criteria? Yes  ?Does the patient have a confirmed or suspected source of infection? No  ?MEWS guidelines implemented *See Row Information* No, previously yellow, continue vital signs every 4 hours  ?Treat  ?MEWS Interventions Administered scheduled meds/treatments;Administered prn meds/treatments  ?Take Vital Signs  ?Increase Vital Sign Frequency  Yellow: Q 2hr X 2 then Q 4hr X 2, if remains yellow, continue Q 4hrs  ?Escalate  ?MEWS: Escalate Yellow: discuss with charge nurse/RN and consider discussing with provider and RRT  ?Notify: Charge Nurse/RN  ?Name of Charge Nurse/RN Notified Ernestina Penna  ?Date Charge Nurse/RN Notified 04/14/22  ?Time Charge Nurse/RN Notified 2200  ?Assess: SIRS CRITERIA  ?SIRS Temperature  0  ?SIRS Pulse 1  ?SIRS Respirations  0  ?SIRS WBC 0  ?SIRS Score Sum  1  ? ? ?

## 2022-04-16 DIAGNOSIS — I4891 Unspecified atrial fibrillation: Secondary | ICD-10-CM | POA: Diagnosis not present

## 2022-04-16 MED ORDER — AMIODARONE HCL 200 MG PO TABS
200.0000 mg | ORAL_TABLET | Freq: Two times a day (BID) | ORAL | Status: DC
  Administered 2022-04-16 – 2022-04-17 (×3): 200 mg via ORAL
  Filled 2022-04-16 (×3): qty 1

## 2022-04-16 MED ORDER — NICOTINE 14 MG/24HR TD PT24: 14.0000 mg | MEDICATED_PATCH | Freq: Every day | TRANSDERMAL | Status: DC

## 2022-04-16 MED ORDER — AMIODARONE HCL 200 MG PO TABS: 200.0000 mg | ORAL_TABLET | Freq: Every day | ORAL | Status: DC

## 2022-04-16 MED ORDER — METOPROLOL TARTRATE 25 MG PO TABS
25.0000 mg | ORAL_TABLET | Freq: Three times a day (TID) | ORAL | Status: DC
  Administered 2022-04-16 (×2): 25 mg via ORAL
  Filled 2022-04-16 (×2): qty 1

## 2022-04-16 MED ORDER — ALPRAZOLAM 0.25 MG PO TABS
0.2500 mg | ORAL_TABLET | Freq: Two times a day (BID) | ORAL | Status: DC | PRN
  Administered 2022-04-16 – 2022-04-17 (×3): 0.25 mg via ORAL
  Filled 2022-04-16 (×3): qty 1

## 2022-04-16 NOTE — Evaluation (Signed)
Physical Therapy Evaluation Patient Details Name: [name] MRN: [id number] DOB: 1955/12/09 Today's Date: 04/16/2022  History of Present Illness  Pt is a 66 year old female with past medical history of chronic back pain, degenerative joint disease, COPD, hyperlipidemia, hypertension, history of sleep apnea presented to the hospital with acute on chronic back pain. MD assessment includes: New onset atrial fibrillation with RVR and acute on chronic back pain with ambulatory dysfunction.   Clinical Impression  Pt's HR noted to vary from the 110's to the low 140's at rest with MD Dr. Posey Pronto contacted who stated ok for pt to participate with PT services. Pt required extra time and effort but no physical assist with functional tasks per below with back pain limiting pt's activity tolerance.  Pt was only able to take several small steps to her chair before needing to return to sitting with HR in the 110's at end of session.  Of note pt reported some relief to her back pain being in her recliner compared to the bed.  Pt will benefit from PT services in a SNF setting upon discharge to safely address deficits listed in patient problem list for decreased caregiver assistance and eventual return to PLOF.         Recommendations for follow up therapy are one component of a multi-disciplinary discharge planning process, led by the attending physician.  Recommendations may be updated based on patient status, additional functional criteria and insurance authorization.  Follow Up Recommendations Skilled nursing-short term rehab (<3 hours/day)    Assistance Recommended at Discharge Frequent or constant Supervision/Assistance  Patient can return home with the following  A lot of help with walking and/or transfers;A little help with bathing/dressing/bathroom;Assistance with cooking/housework;Direct supervision/assist for medications management;Help with stairs or ramp for entrance;Assist for transportation     Equipment Recommendations None recommended by PT  Recommendations for Other Services       Functional Status Assessment Patient has had a recent decline in their functional status and demonstrates the ability to make significant improvements in function in a reasonable and predictable amount of time.     Precautions / Restrictions Precautions Precautions: Fall Restrictions Weight Bearing Restrictions: No Other Position/Activity Restrictions: Target HR 65-105 bpm      Mobility  Bed Mobility Overal bed mobility: Modified Independent             General bed mobility comments: Extra time and effort only    Transfers Overall transfer level: Needs assistance Equipment used: Rolling walker (2 wheels) Transfers: Sit to/from Stand Sit to Stand: From elevated surface, Min guard           General transfer comment: Extra time and effort with cues for hand placement    Ambulation/Gait Ambulation/Gait assistance: Min guard Gait Distance (Feet): 2 Feet Assistive device: Rolling walker (2 wheels) Gait Pattern/deviations: Step-to pattern, Trunk flexed, Decreased step length - right, Decreased step length - left Gait velocity: decreased     General Gait Details: Pt able to take a max of 3-4 small steps from bed to chair before needing to return to sitting; unable to obtain an SpO2 reading with HR varying from the 110's to the low 140's on 3L.  Stairs            Wheelchair Mobility    Modified Rankin (Stroke Patients Only)       Balance Overall balance assessment: Needs assistance Sitting-balance support: Bilateral upper extremity supported, Feet supported Sitting balance-Leahy Scale: Good     Standing  balance support: Bilateral upper extremity supported, During functional activity Standing balance-Leahy Scale: Fair                               Pertinent Vitals/Pain Pain Assessment Pain Assessment: 0-10 Pain Score: 10-Worst pain ever Pain  Location: back Pain Descriptors / Indicators: Aching Pain Intervention(s): Repositioned, Premedicated before session, Monitored during session    Duck expects to be discharged to:: Private residence Living Arrangements: Children Available Help at Discharge: Family;Available 24 hours/day Type of Home: House Home Access: Stairs to enter Entrance Stairs-Rails: Left Entrance Stairs-Number of Steps: 4   Home Layout: One level Home Equipment: Conservation officer, nature (2 wheels);Cane - single point;Shower seat;Grab bars - tub/shower;Wheelchair Probation officer (4 wheels)      Prior Function Prior Level of Function : Independent/Modified Independent             Mobility Comments: Mod Ind amb with a rollator limited community distances, no fall history ADLs Comments: Ind with ADLs     Hand Dominance        Extremity/Trunk Assessment   Upper Extremity Assessment Upper Extremity Assessment: Generalized weakness    Lower Extremity Assessment Lower Extremity Assessment: Generalized weakness       Communication   Communication: No difficulties  Cognition Arousal/Alertness: Awake/alert Behavior During Therapy: WFL for tasks assessed/performed Overall Cognitive Status: Within Functional Limits for tasks assessed                                          General Comments      Exercises Total Joint Exercises Ankle Circles/Pumps: Strengthening, Both, 10 reps Quad Sets: Strengthening, Both, 10 reps Gluteal Sets: Strengthening, Both, 10 reps Long Arc Quad: Strengthening, Both, 10 reps Knee Flexion: Strengthening, Both, 10 reps Other Exercises Other Exercises: HEP education for BLE AP, QS, GS, and LAQs   Assessment/Plan    PT Assessment Patient needs continued PT services  PT Problem List Decreased strength;Decreased activity tolerance;Decreased balance;Decreased mobility;Decreased knowledge of use of DME;Pain       PT Treatment  Interventions DME instruction;Gait training;Stair training;Functional mobility training;Therapeutic activities;Therapeutic exercise;Balance training;Patient/family education    PT Goals (Current goals can be found in the Care Plan section)  Acute Rehab PT Goals Patient Stated Goal: To walk better PT Goal Formulation: With patient Time For Goal Achievement: 04/29/22 Potential to Achieve Goals: Good    Frequency Min 2X/week     Co-evaluation               AM-PAC PT "6 Clicks" Mobility  Outcome Measure Help needed turning from your back to your side while in a flat bed without using bedrails?: A Little Help needed moving from lying on your back to sitting on the side of a flat bed without using bedrails?: A Little Help needed moving to and from a bed to a chair (including a wheelchair)?: A Little Help needed standing up from a chair using your arms (e.g., wheelchair or bedside chair)?: A Little Help needed to walk in hospital room?: A Lot Help needed climbing 3-5 steps with a railing? : Total 6 Click Score: 15    End of Session Equipment Utilized During Treatment: Gait belt;Oxygen Activity Tolerance: Patient limited by pain Patient left: in chair;with call bell/phone within reach;with chair alarm set;with family/visitor present Nurse Communication: Mobility status PT  Visit Diagnosis: Difficulty in walking, not elsewhere classified (R26.2);Muscle weakness (generalized) (M62.81);Pain Pain - part of body:  (back)    Time: 0223-3612 PT Time Calculation (min) (ACUTE ONLY): 38 min   Charges:   PT Evaluation $PT Eval Moderate Complexity: 1 Mod PT Treatments $Therapeutic Exercise: 8-22 mins       D. Royetta Asal PT, DPT 04/16/22, 1:59 PM

## 2022-04-16 NOTE — Progress Notes (Signed)
[place] NOTE       Patient ID: [name] MRN: [id number] DOB/AGE: 1956/05/14 66 y.o.  Admit date: 04/14/2022 Referring Physician Dr. Fritzi Mandes Primary Physician Marnee Guarneri, NP Primary Cardiologist Dr. Ubaldo Glassing (last seen in 2017) Reason for Consultation new AF with RVR  HPI: [name] is a 66yoF with a PMH of degenerative joint disease, chronic back pain, COPD with ongoing tobacco use, hypertension, hyperlipidemia, obesity, CKD 3, GERD, anxiety and depression, who presented with [facility] ED 04/14/22 with back pain that was more severe than usual x 3 days, limiting her ability to ambulate. She was found to be in AF with RVR, new onset. Cardiology is consulted for further assistance.   Interval History: -sitting up in chair with her son and friend at bedside.  -back pain a little better, remains in AF with rate still in the 110s-120s mostly with addition of 12.5 metop tartrate TID -eager to go home. No chest pain, palpitations, breathing improving.  -echo with poor quality images but normal LVEF 55-60%  Review of systems complete and found to be negative unless listed above     Past Medical History:  Diagnosis Date   Arthritis    COPD (chronic obstructive pulmonary disease) (HCC)    Enlarged heart    GERD (gastroesophageal reflux disease)    Hiatal hernia    Hx of degenerative disc disease    Hyperlipidemia    Hypertension    Mass    On adrenal gland   Monocytosis    Osteoporosis    Sleep apnea     Past Surgical History:  Procedure Laterality Date   ABDOMINAL HYSTERECTOMY     ACHILLES TENDON REPAIR     Removed bone spur and repaired achilles tendon   APPENDECTOMY     bone fusion in neck     COLONOSCOPY WITH PROPOFOL N/A 01/17/2019   Procedure: COLONOSCOPY WITH PROPOFOL;  Surgeon: Jonathon Bellows, MD;  Location: [facility] ENDOSCOPY;  Service: Gastroenterology;  Laterality: N/A;   HERNIA REPAIR     has not had repair   TONSILLECTOMY       Medications Prior to Admission  Medication Sig Dispense Refill Last Dose   acetaminophen (TYLENOL) 500 MG tablet Take 1,000 mg by mouth in the morning and at bedtime.   04/13/2022   albuterol (PROAIR HFA) 108 (90 Base) MCG/ACT inhaler Inhale 2 puffs into the lungs every 6 (six) hours as needed for wheezing or shortness of breath. 8.5 g 5 Past Month   ascorbic acid (VITAMIN C) 500 MG tablet Take 500 mg by mouth daily.   04/14/2022   aspirin 81 MG tablet Take 81 mg by mouth daily.   04/14/2022   atorvastatin (LIPITOR) 80 MG tablet Take 1 tablet (80 mg total) by mouth daily. 90 tablet 4 04/13/2022   baclofen (LIORESAL) 10 MG tablet Take 0.5 tablets (5 mg total) by mouth 3 (three) times daily. 135 each 4 04/14/2022   busPIRone (BUSPAR) 10 MG tablet Take 2 tablets (20 mg total) by mouth 3 (three) times daily. 180 tablet 12 04/14/2022   Calcium Citrate-Vitamin D (CALCIUM + D PO) Take 1,000 mg by mouth daily.   04/14/2022   Cholecalciferol (VITAMIN D) 50 MCG (2000 UT) CAPS Take by mouth.   04/14/2022   DULoxetine (CYMBALTA) 60 MG capsule Take 1 capsule (60 mg total) by mouth daily. 90 capsule 4 04/14/2022   furosemide (LASIX) 20 MG tablet Take 1 tablet (20 mg total) by mouth daily. [place]cy  tablet 4 04/14/2022   gabapentin (NEURONTIN) 800 MG tablet Take 800 mg by mouth in the morning and at bedtime.   04/14/2022   hydrOXYzine (ATARAX/VISTARIL) 10 MG tablet Take 1 tablet (10 mg total) by mouth 3 (three) times daily as needed. 270 tablet 4 04/14/2022   loratadine (CLARITIN) 10 MG tablet Take 10 mg by mouth daily.   Past Month   Melatonin 10 MG TABS Take 1 tablet by mouth at bedtime.   04/13/2022   Multiple Vitamins-Minerals (MULTIVITAMIN WITH MINERALS) tablet Take 1 tablet by mouth daily.   04/14/2022   omeprazole (PRILOSEC) 40 MG capsule Take 1 capsule (40 mg total) by mouth daily. 90 capsule 4 04/14/2022   potassium chloride SA (KLOR-CON) 20 MEQ tablet Take 1 tablet (20 mEq total) by mouth daily. 90 tablet 4 04/14/2022    telmisartan-hydrochlorothiazide (MICARDIS HCT) 40-12.5 MG tablet Take 1 tablet by mouth daily. 90 tablet 4 04/14/2022   Tiotropium Bromide Monohydrate (SPIRIVA RESPIMAT) 2.5 MCG/ACT AERS INHALE 2 SPRAY(S) BY MOUTH ONCE DAILY 4 g 5 04/13/2022   vitamin B-12 (CYANOCOBALAMIN) 1000 MCG tablet Take 1,000 mcg by mouth daily.   04/14/2022   Semaglutide, 1 MG/DOSE, 4 MG/3ML SOPN Inject 1 mg as directed once a week. 9 mL 4 04/08/2022   triamcinolone cream (KENALOG) 0.1 % Apply 1 application topically 2 (two) times daily. 30 g 0    Social History   Socioeconomic History   Marital status: Widowed    Spouse name: Not on file   Number of children: Not on file   Years of education: Not on file   Highest education level: Not on file  Occupational History   Occupation: disabled  Tobacco Use   Smoking status: Every Day    Packs/day: 1.00    Years: 48.00    Pack years: 48.00    Types: Cigarettes   Smokeless tobacco: Never   Tobacco comments:    pt states she plans to begin using patch to quit smoking  Vaping Use   Vaping Use: Never used  Substance and Sexual Activity   Alcohol use: No    Alcohol/week: 0.0 standard drinks   Drug use: No   Sexual activity: Never    Partners: Male  Other Topics Concern   Not on file  Social History Narrative   Not on file   Social Determinants of Health   Financial Resource Strain: Low Risk    Difficulty of Paying Living Expenses: Not very hard  Food Insecurity: No Food Insecurity   Worried About Running Out of Food in the Last Year: Never true   [place] in the Last Year: Never true  Transportation Needs: No Transportation Needs   Lack of Transportation (Medical): No   Lack of Transportation (Non-Medical): No  Physical Activity: Inactive   Days of Exercise per Week: 0 days   Minutes of Exercise per Session: 0 min  Stress: No Stress Concern Present   Feeling of Stress : Only a little  Social Connections: Socially Isolated   Frequency of  Communication with Friends and Family: More than three times a week   Frequency of Social Gatherings with Friends and Family: More than three times a week   Attends Religious Services: Never   Marine scientist or Organizations: No   Attends Archivist Meetings: Never   Marital Status: Widowed  Intimate Partner Violence: Not At Risk   Fear of Current or Ex-Partner: No   Emotionally Abused: No  Physically Abused: No   Sexually Abused: No    Family History  Problem Relation Age of Onset   COPD Father    Heart disease Father    Hyperlipidemia Father    Hypertension Father    Dementia Father    Cancer Sister    Hyperlipidemia Sister    Hypertension Sister    Lymphoma Sister    Migraines Son    Stroke Maternal Grandfather    Heart attack Brother    Cancer Brother        colorectal and liver   Breast cancer Paternal Aunt    Breast cancer Paternal Aunt       PHYSICAL EXAM General: Chronically ill-appearing Caucasian female appearing older than stated age, well nourished, in no acute distress.  Sitting in recliner with son and female friend at bedside. Resting tremor to right upper extremity. HEENT:  Normocephalic and atraumatic. Neck:  No JVD.  Lungs: Normal respiratory effort on oxygen by nasal cannula. Clear bilaterally to auscultation. No wheezes, crackles, rhonchi.  Heart: tachy irregular rate and rhythm. Normal S1 and S2 without gallops or murmurs. Radial & DP pulses 2+ bilaterally. Abdomen: Obese appearing with ventral hernia present.  Msk: Normal strength and tone for age. Extremities: Warm and well perfused. No clubbing, cyanosis.  No peripheral edema.  Neuro: Alert and oriented X 3. Psych:  Answers questions appropriately.   Labs:   Lab Results  Component Value Date   WBC 15.3 (H) 04/15/2022   HGB 12.5 04/15/2022   HCT 38.7 04/15/2022   MCV 89.4 04/15/2022   PLT 352 04/15/2022    Recent Labs  Lab 04/14/22 1454 04/15/22 0648  NA 140 137  K  4.9 4.2  CL 107 106  CO2 23 22  BUN 36* 28*  CREATININE 1.37* 1.19*  CALCIUM 9.6 9.1  PROT 6.9  --   BILITOT 0.7  --   ALKPHOS 151*  --   ALT 22  --   AST 26  --   GLUCOSE 115* 95    No results found for: CKTOTAL, CKMB, CKMBINDEX, TROPONINI  Lab Results  Component Value Date   CHOL 123 04/15/2022   CHOL 150 12/10/2021   CHOL 179 09/03/2021   Lab Results  Component Value Date   HDL 29 (L) 04/15/2022   HDL 47 12/10/2021   HDL 50 09/03/2021   Lab Results  Component Value Date   LDLCALC 64 04/15/2022   LDLCALC 78 12/10/2021   LDLCALC 101 (H) 09/03/2021   Lab Results  Component Value Date   TRIG 148 04/15/2022   TRIG 142 12/10/2021   TRIG 160 (H) 09/03/2021   Lab Results  Component Value Date   CHOLHDL 4.2 04/15/2022   No results found for: LDLDIRECT    Radiology: CT LUMBAR SPINE W CONTRAST  Result Date: 04/15/2022 CLINICAL DATA:  Chronic back pain, spinal stenosis, hypotension EXAM: CT LUMBAR SPINE WITH CONTRAST TECHNIQUE: Multidetector CT imaging of the lumbar spine was performed with intravenous contrast administration. RADIATION DOSE REDUCTION: This exam was performed according to the departmental dose-optimization program which includes automated exposure control, adjustment of the mA and/or kV according to patient size and/or use of iterative reconstruction technique. CONTRAST:  67m OMNIPAQUE IOHEXOL 300 MG/ML  SOLN COMPARISON:  CT from previous day FINDINGS: Segmentation: 5 non rib-bearing lumbar segments assigned L1-L5. Alignment: Normal Vertebrae: No acute fracture or focal pathologic process. Paraspinal and other soft tissues: Moderate calcified aortoiliac atheromatous plaque. Stable right adrenal mass with coarse calcifications.  Distended urinary bladder. Remainder visualized paraspinal soft tissues unremarkable. No areas of unexpected enhancement after IV contrast administration. Disc levels: T11-12: Moderate narrowing of the interspace with discogenic sclerosis  in the adjacent vertebral bodies, and endplate spurring. T12-L1: Minimal anterior endplate spurring E9-3: Unremarkable L2-3: Mild disc bulge.  Early left facet DJD. L3-4: Mild disc bulge.  Early facet DJD left greater than right. L4-5: Moderate narrowing views face with moderate vacuum phenomenon, and discogenic sclerosis in the adjacent vertebral bodies. Broad posterior disc bulge with osteophytic ridging. Bilateral facet DJD contributing to foraminal encroachment right greater than left. L5-S1: Moderate bilateral facet DJD. No severe osseous foraminal stenosis. IMPRESSION: 1. No acute findings. 2. 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 3. Bilateral foraminal encroachment L4-5 right worse than left. 4.  Aortic Atherosclerosis (ICD10-170.0).  For Electronically Signed   By: Lucrezia Europe M.D.   On: 04/15/2022 11:45   CT ABDOMEN PELVIS W CONTRAST  Result Date: 04/14/2022 CLINICAL DATA:  Abdominal and back pain, hypotension, tachycardia, EXAM: CT ABDOMEN AND PELVIS WITH CONTRAST TECHNIQUE: Multidetector CT imaging of the abdomen and pelvis was performed using the standard protocol following bolus administration of intravenous contrast. RADIATION DOSE REDUCTION: This exam was performed according to the departmental dose-optimization program which includes automated exposure control, adjustment of the mA and/or kV according to patient size and/or use of iterative reconstruction technique. CONTRAST:  62m OMNIPAQUE IOHEXOL 300 MG/ML SOLN are IV. No oral contrast. COMPARISON:  08/12/2015 FINDINGS: Lower chest: Lung bases clear Hepatobiliary: 12 mm gallstone within gallbladder. No gallbladder wall thickening or biliary dilatation. Liver normal appearance. Pancreas: Normal appearance Spleen: Normal appearance Adrenals/Urinary Tract: LEFT adrenal gland normal appearance. RIGHT adrenal mass 3.3 cm greatest diameter, previously 3.1 cm, 38 HU on portal venous phase imaging, 26 HU on  delayed images now containing calcifications. Kidneys, ureters, and bladder normal appearance. Stomach/Bowel: Transverse colon and small bowel loops extend to a large umbilical hernia. Appendix surgically absent by history. Diverticulosis of sigmoid colon. Stomach and bowel loops otherwise normal appearance. No evidence of bowel wall thickening or obstruction. Vascular/Lymphatic: Atherosclerotic calcifications aorta iliac arteries without aneurysm. Periportal and celiac axis adenopathy including 15 mm portal caval node image 17, 11 mm celiac node image 17, 16 mm portacaval node image 23, 12 mm gastrohepatic ligament node image 13. These appear new or increased in sizes versus previous exam. Reproductive: Uterus surgically absent.  Atrophic ovaries. Other: No free air or free fluid.  No inflammatory process. Musculoskeletal: Degenerative disc disease changes thoracolumbar spine with degenerative changes of RIGHT hip joint as well. IMPRESSION: Large umbilical hernia containing nonobstructed transverse colon and small bowel loops. Cholelithiasis without evidence of cholecystitis. Sigmoid diverticulosis without evidence of diverticulitis. RIGHT adrenal mass 3.3 cm greatest diameter, previously 3.1 cm; stability for over 6 years is indicative of a probably benign lesion and no follow-up imaging is recommended. New or increased sizes of upper abdominal lymph nodes, nonspecific, could be reactive in the setting of prior infection or inflammation, or due to either lymphoma or metastatic disease. Consider follow-up PET-CT imaging to assess metabolic activity of these nodes; if CT is not performed, would perform short-term follow-up CT in 3 months to reassess. Aortic Atherosclerosis (ICD10-I70.0). Electronically Signed   By: MLavonia DanaM.D.   On: 04/14/2022 15:49   ECHOCARDIOGRAM COMPLETE  Result Date: 04/15/2022    ECHOCARDIOGRAM REPORT   Patient Name:   Kathleen GIRALDODate of Exam: 04/15/2022 Medical Rec #:  [id number]  Height:       64.0 in Accession #:    [id number]      Weight:       240.7 lb Date of Birth:  25-Mar-1956       BSA:          2.117 m Patient Age:    49 years        BP:           119/73 mmHg Patient Gender: F               HR:           150 bpm. Exam Location:  [facility] Procedure: 2D Echo, Color Doppler and Cardiac Doppler Indications:     I48.91 Atrial fibrillation  History:         Patient has prior history of Echocardiogram examinations. COPD;                  Risk Factors:Hypertension, Dyslipidemia and Sleep Apnea.  Sonographer:     Charmayne Sheer Referring Phys:  [id number] [place] Diagnosing Phys: Yolonda Kida MD  Sonographer Comments: Technically difficult study due to poor echo windows and suboptimal apical window. Image acquisition challenging due to COPD and due to tachycardia throughout study. IMPRESSIONS  1. Left ventricular ejection fraction, by estimation, is 55 to 60%. The left ventricle has normal function. The left ventricle has no regional wall motion abnormalities. Left ventricular diastolic parameters were normal.  2. Right ventricular systolic function is normal. The right ventricular size is normal.  3. The mitral valve is normal in structure. Trivial mitral valve regurgitation.  4. The aortic valve is grossly normal. Aortic valve regurgitation is not visualized. Conclusion(s)/Recommendation(s): Poor windows for evaluation of left ventricular function by transthoracic echocardiography. Would recommend an alternative means of evaluation. FINDINGS  Left Ventricle: Left ventricular ejection fraction, by estimation, is 55 to 60%. The left ventricle has normal function. The left ventricle has no regional wall motion abnormalities. The left ventricular internal cavity size was normal in size. There is  borderline left ventricular hypertrophy. Left ventricular diastolic parameters were normal. Right Ventricle: The right ventricular size is normal. No increase in right ventricular wall thickness.  Right ventricular systolic function is normal. Left Atrium: Left atrial size was normal in size. Right Atrium: Right atrial size was normal in size. Pericardium: There is no evidence of pericardial effusion. Mitral Valve: The mitral valve is normal in structure. Trivial mitral valve regurgitation. MV peak gradient, 4.4 mmHg. The mean mitral valve gradient is 2.0 mmHg. Tricuspid Valve: The tricuspid valve is normal in structure. Tricuspid valve regurgitation is not demonstrated. Aortic Valve: The aortic valve is grossly normal. Aortic valve regurgitation is not visualized. Aortic valve mean gradient measures 4.0 mmHg. Aortic valve peak gradient measures 6.6 mmHg. Aortic valve area, by VTI measures 2.25 cm. Pulmonic Valve: The pulmonic valve was normal in structure. Pulmonic valve regurgitation is not visualized. Aorta: The ascending aorta was not well visualized. IAS/Shunts: No atrial level shunt detected by color flow Doppler.  LEFT VENTRICLE PLAX 2D LVIDd:         4.70 cm   Diastology LVIDs:         3.79 cm   LV e' medial:    7.72 cm/s LV PW:         1.20 cm   LV E/e' medial:  13.2 LV IVS:        0.89 cm   LV e' lateral:   11.90 cm/s  LVOT diam:     2.10 cm   LV E/e' lateral: 8.5 LV SV:         50 LV SV Index:   24 LVOT Area:     3.46 cm  LEFT ATRIUM           Index LA diam:      4.30 cm 2.03 cm/m LA Vol (A2C): 76.4 ml 36.09 ml/m  AORTIC VALVE                    PULMONIC VALVE AV Area (Vmax):    3.19 cm     PV Vmax:       1.25 m/s AV Area (Vmean):   2.64 cm     PV Vmean:      86.800 cm/s AV Area (VTI):     2.25 cm     PV VTI:        0.213 m AV Vmax:           128.00 cm/s  PV Peak grad:  6.2 mmHg AV Vmean:          98.600 cm/s  PV Mean grad:  3.0 mmHg AV VTI:            0.222 m AV Peak Grad:      6.6 mmHg AV Mean Grad:      4.0 mmHg LVOT Vmax:         118.00 cm/s LVOT Vmean:        75.100 cm/s LVOT VTI:          0.144 m LVOT/AV VTI ratio: 0.65  AORTA Ao Root diam: 3.40 cm MITRAL VALVE MV Area (PHT): 9.18 cm      SHUNTS MV Area VTI:   2.71 cm     Systemic VTI:  0.14 m MV Peak grad:  4.4 mmHg     Systemic Diam: 2.10 cm MV Mean grad:  2.0 mmHg MV Vmax:       1.05 m/s MV Vmean:      66.1 cm/s MV Decel Time: 83 msec MV E velocity: 101.60 cm/s Yolonda Kida MD Electronically signed by Yolonda Kida MD Signature Date/Time: 04/15/2022/2:03:27 PM    Final     ECHO 12/2015 LV EF: 60% -   65%   -------------------------------------------------------------------  Indications:      Dyspnea 786.09.   -------------------------------------------------------------------  Study Conclusions   - Procedure narrative: Transthoracic echocardiography. The study    was technically difficult.  - Left ventricle: The cavity size was normal. Systolic function was    normal. The estimated ejection fraction was in the range of 60%    to 65%.  - Aortic valve: Valve area (Vmax): 2.66 cm^2.   Transthoracic echocardiography.  M-mode, complete 2D, spectral  Doppler, and color Doppler.  Birthdate:  Patient birthdate:  11-08-1956.  Age:  Patient is 65 yr old.  Sex:  Gender: female.  BMI: 47.3 kg/m^2.  Patient status:  Outpatient.  Study date:  Study  date: 12/26/2015. Study time: 11:06 AM.   -------------------------------------------------------------------   -------------------------------------------------------------------  Left ventricle:  The cavity size was normal. Systolic function was  normal. The estimated ejection fraction was in the range of 60% to  65%.   -------------------------------------------------------------------  Aortic valve:   Structurally normal valve.   Cusp separation was  normal.  Doppler:  Transvalvular velocity was within the normal  range. There was no stenosis. There was no regurgitation.    Peak  velocity ratio of  LVOT to aortic valve: 0.77. Valve area (Vmax):  2.66 cm^2. Indexed valve area (Vmax): 1.11 cm^2/m^2.    Peak  gradient (S): 9 mm Hg.    -------------------------------------------------------------------  Aorta:  The aorta was normal, not dilated, and non-diseased.   -------------------------------------------------------------------  Mitral valve:   Structurally normal valve.   Leaflet separation was  normal.  Doppler:  Transvalvular velocity was within the normal  range. There was no evidence for stenosis. There was no  regurgitation.   -------------------------------------------------------------------  Left atrium:  The atrium was normal in size.   -------------------------------------------------------------------  Right ventricle:  The cavity size was normal. Wall thickness was  normal. Systolic function was normal.   -------------------------------------------------------------------  Tricuspid valve:   Doppler:  There was trivial regurgitation.     -------------------------------------------------------------------  Right atrium:  The atrium was normal in size.     -------------------------------------------------------------------  Post procedure conclusions  Ascending Aorta:   - The aorta was normal, not dilated, and non-diseased.   TELEMETRY reviewed by me: A-fib with RVR, rates between 130-160s  EKG reviewed by me: A-fib rate 134  ASSESSMENT AND PLAN:  [name] is a 67yoF with a PMH of degenerative joint disease, chronic back pain, COPD with ongoing tobacco use, hypertension, hyperlipidemia, obesity, CKD 3, GERD, anxiety and depression, who presented with [facility] ED 04/14/22 with back pain that was more severe than usual x 3 days, limiting her ability to ambulate. She was found to be in AF with RVR, new onset. Cardiology is consulted for further assistance.   #New onset atrial fibrillation with RVR #Acute on chronic low back pain Patient presents with a 3-day history of worsening atraumatic lower back pain from her baseline and was found to be in A-fib with RVR on admission.  She was initially  started on a diltiazem drip but it had to be discontinued due to hypotension and is now on amiodarone drip with difficult to control heart rates.  Her electrolytes and thyroid panel are within normal limits.  -Continue pain management and other etiologies of increased adrenergic tone -S/p diltiazem gtt., discontinued due to hypotension -d/c amiodarone infusion, change to amiodarone '200mg'$  PO BID x 5 days then '200mg'$  daily therafter.  -s/p  IV metoprolol 5 mg x 1, increase metoprolol tartrate from 12.5 mg TID to '25mg'$  TID  -CHA2DS2-VASc 3, was started on Eliquis 5 mg twice daily for stroke risk reduction -Echocardiogram complete (poor quality study) with preserved LVEF  #Hypertension #Hyperlipidemia Continue home atorvastatin, Lasix 20 mg daily for peripheral edema  #COPD #Tobacco use Recommend complete cessation from smoking, patient has tried in the past to quit and is motivated to try again.  #OSA -Continue CPAP nightly  This patient's plan of care was discussed and created with Dr. Clayborn Bigness and he is in agreement.  Signed: Tristan Schroeder , PA-C 04/16/2022, 11:31 AM [facility] Cardiology

## 2022-04-16 NOTE — Progress Notes (Signed)
[facility] [place] NAME: [name]    MR#:  [id number]  DATE OF BIRTH:  09/12/1956  SUBJECTIVE:   patient is having much better day. She is sitting out in the chair. Son in the room. Continues with some back pain. Although did work with physical therapy. Heart rate still around 110 to 115 occasionally goes up in the 130 no chest pain  VITALS:  Blood pressure (!) 104/59, pulse (!) 121, temperature 98.6 F (37 C), temperature source Oral, resp. rate 17, height '5\' 4"'$  (1.626 m), weight 111.1 kg, SpO2 96 %.  PHYSICAL EXAMINATION:   GENERAL:  66 y.o.-year-old patient lying in the bed with no acute distress. Obesity LUNGS: Normal breath sounds bilaterally, no wheezing, rales, rhonchi.  CARDIOVASCULAR: S1, S2 normal. No murmurs, rubs, or gallops. Tachycardia+ ABDOMEN: Soft, nontender, nondistended. Bowel sounds present.  EXTREMITIES: No  edema b/l.    NEUROLOGIC: nonfocal  patient is alert and awake   LABORATORY PANEL:  CBC Recent Labs  Lab 04/15/22 0648  WBC 15.3*  HGB 12.5  HCT 38.7  PLT 352     Chemistries  Recent Labs  Lab 04/14/22 1454 04/14/22 2350 04/15/22 0648  NA 140  --  137  K 4.9  --  4.2  CL 107  --  106  CO2 23  --  22  GLUCOSE 115*  --  95  BUN 36*  --  28*  CREATININE 1.37*  --  1.19*  CALCIUM 9.6  --  9.1  MG  --    < > 2.1  AST 26  --   --   ALT 22  --   --   ALKPHOS 151*  --   --   BILITOT 0.7  --   --    < > = values in this interval not displayed.    Cardiac Enzymes No results for input(s): TROPONINI in the last 168 hours. RADIOLOGY:  CT LUMBAR SPINE W CONTRAST  Result Date: 04/15/2022 CLINICAL DATA:  Chronic back pain, spinal stenosis, hypotension EXAM: CT LUMBAR SPINE WITH CONTRAST TECHNIQUE: Multidetector CT imaging of the lumbar spine was performed with intravenous contrast administration. RADIATION DOSE REDUCTION: This exam was performed according to the departmental dose-optimization  program which includes automated exposure control, adjustment of the mA and/or kV according to patient size and/or use of iterative reconstruction technique. CONTRAST:  28m OMNIPAQUE IOHEXOL 300 MG/ML  SOLN COMPARISON:  CT from previous day FINDINGS: Segmentation: 5 non rib-bearing lumbar segments assigned L1-L5. Alignment: Normal Vertebrae: No acute fracture or focal pathologic process. Paraspinal and other soft tissues: Moderate calcified aortoiliac atheromatous plaque. Stable right adrenal mass with coarse calcifications. Distended urinary bladder. Remainder visualized paraspinal soft tissues unremarkable. No areas of unexpected enhancement after IV contrast administration. Disc levels: T11-12: Moderate narrowing of the interspace with discogenic sclerosis in the adjacent vertebral bodies, and endplate spurring. T12-L1: Minimal anterior endplate spurring LT4-1 Unremarkable L2-3: Mild disc bulge.  Early left facet DJD. L3-4: Mild disc bulge.  Early facet DJD left greater than right. L4-5: Moderate narrowing views face with moderate vacuum phenomenon, and discogenic sclerosis in the adjacent vertebral bodies. Broad posterior disc bulge with osteophytic ridging. Bilateral facet DJD contributing to foraminal encroachment right greater than left. L5-S1: Moderate bilateral facet DJD. No severe osseous foraminal stenosis. IMPRESSION: 1. No acute findings. 2. 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 3. Bilateral foraminal encroachment L4-5 right worse  than left. 4.  Aortic Atherosclerosis (ICD10-170.0).  For Electronically Signed   By: Lucrezia Europe M.D.   On: 04/15/2022 11:45   CT ABDOMEN PELVIS W CONTRAST  Result Date: 04/14/2022 CLINICAL DATA:  Abdominal and back pain, hypotension, tachycardia, EXAM: CT ABDOMEN AND PELVIS WITH CONTRAST TECHNIQUE: Multidetector CT imaging of the abdomen and pelvis was performed using the standard protocol following bolus administration of  intravenous contrast. RADIATION DOSE REDUCTION: This exam was performed according to the departmental dose-optimization program which includes automated exposure control, adjustment of the mA and/or kV according to patient size and/or use of iterative reconstruction technique. CONTRAST:  87m OMNIPAQUE IOHEXOL 300 MG/ML SOLN are IV. No oral contrast. COMPARISON:  08/12/2015 FINDINGS: Lower chest: Lung bases clear Hepatobiliary: 12 mm gallstone within gallbladder. No gallbladder wall thickening or biliary dilatation. Liver normal appearance. Pancreas: Normal appearance Spleen: Normal appearance Adrenals/Urinary Tract: LEFT adrenal gland normal appearance. RIGHT adrenal mass 3.3 cm greatest diameter, previously 3.1 cm, 38 HU on portal venous phase imaging, 26 HU on delayed images now containing calcifications. Kidneys, ureters, and bladder normal appearance. Stomach/Bowel: Transverse colon and small bowel loops extend to a large umbilical hernia. Appendix surgically absent by history. Diverticulosis of sigmoid colon. Stomach and bowel loops otherwise normal appearance. No evidence of bowel wall thickening or obstruction. Vascular/Lymphatic: Atherosclerotic calcifications aorta iliac arteries without aneurysm. Periportal and celiac axis adenopathy including 15 mm portal caval node image 17, 11 mm celiac node image 17, 16 mm portacaval node image 23, 12 mm gastrohepatic ligament node image 13. These appear new or increased in sizes versus previous exam. Reproductive: Uterus surgically absent.  Atrophic ovaries. Other: No free air or free fluid.  No inflammatory process. Musculoskeletal: Degenerative disc disease changes thoracolumbar spine with degenerative changes of RIGHT hip joint as well. IMPRESSION: Large umbilical hernia containing nonobstructed transverse colon and small bowel loops. Cholelithiasis without evidence of cholecystitis. Sigmoid diverticulosis without evidence of diverticulitis. RIGHT adrenal mass 3.3  cm greatest diameter, previously 3.1 cm; stability for over 6 years is indicative of a probably benign lesion and no follow-up imaging is recommended. New or increased sizes of upper abdominal lymph nodes, nonspecific, could be reactive in the setting of prior infection or inflammation, or due to either lymphoma or metastatic disease. Consider follow-up PET-CT imaging to assess metabolic activity of these nodes; if CT is not performed, would perform short-term follow-up CT in 3 months to reassess. Aortic Atherosclerosis (ICD10-I70.0). Electronically Signed   By: MLavonia DanaM.D.   On: 04/14/2022 15:49   ECHOCARDIOGRAM COMPLETE  Result Date: 04/15/2022    ECHOCARDIOGRAM REPORT   Patient Name:   Kathleen KOOPMANDate of Exam: 04/15/2022 Medical Rec #:  [id number]      Height:       64.0 in Accession #:    [id number]     Weight:       240.7 lb Date of Birth:  [birth year]      BSA:          2.117 m Patient Age:    89years        BP:           119/73 mmHg Patient Gender: F               HR:           150 bpm. Exam Location:  [facility] Procedure: 2D Echo, Color Doppler and Cardiac Doppler Indications:     I48.91 Atrial fibrillation  History:         Patient has prior history of Echocardiogram examinations. COPD;                  Risk Factors:Hypertension, Dyslipidemia and Sleep Apnea.  Sonographer:     Charmayne Sheer Referring Phys:  [id number] [place] Diagnosing Phys: Yolonda Kida MD  Sonographer Comments: Technically difficult study due to poor echo windows and suboptimal apical window. Image acquisition challenging due to COPD and due to tachycardia throughout study. IMPRESSIONS  1. Left ventricular ejection fraction, by estimation, is 55 to 60%. The left ventricle has normal function. The left ventricle has no regional wall motion abnormalities. Left ventricular diastolic parameters were normal.  2. Right ventricular systolic function is normal. The right ventricular size is normal.  3. The mitral valve is normal  in structure. Trivial mitral valve regurgitation.  4. The aortic valve is grossly normal. Aortic valve regurgitation is not visualized. Conclusion(s)/Recommendation(s): Poor windows for evaluation of left ventricular function by transthoracic echocardiography. Would recommend an alternative means of evaluation. FINDINGS  Left Ventricle: Left ventricular ejection fraction, by estimation, is 55 to 60%. The left ventricle has normal function. The left ventricle has no regional wall motion abnormalities. The left ventricular internal cavity size was normal in size. There is  borderline left ventricular hypertrophy. Left ventricular diastolic parameters were normal. Right Ventricle: The right ventricular size is normal. No increase in right ventricular wall thickness. Right ventricular systolic function is normal. Left Atrium: Left atrial size was normal in size. Right Atrium: Right atrial size was normal in size. Pericardium: There is no evidence of pericardial effusion. Mitral Valve: The mitral valve is normal in structure. Trivial mitral valve regurgitation. MV peak gradient, 4.4 mmHg. The mean mitral valve gradient is 2.0 mmHg. Tricuspid Valve: The tricuspid valve is normal in structure. Tricuspid valve regurgitation is not demonstrated. Aortic Valve: The aortic valve is grossly normal. Aortic valve regurgitation is not visualized. Aortic valve mean gradient measures 4.0 mmHg. Aortic valve peak gradient measures 6.6 mmHg. Aortic valve area, by VTI measures 2.25 cm. Pulmonic Valve: The pulmonic valve was normal in structure. Pulmonic valve regurgitation is not visualized. Aorta: The ascending aorta was not well visualized. IAS/Shunts: No atrial level shunt detected by color flow Doppler.  LEFT VENTRICLE PLAX 2D LVIDd:         4.70 cm   Diastology LVIDs:         3.79 cm   LV e' medial:    7.72 cm/s LV PW:         1.20 cm   LV E/e' medial:  13.2 LV IVS:        0.89 cm   LV e' lateral:   11.90 cm/s LVOT diam:     2.10  cm   LV E/e' lateral: 8.5 LV SV:         50 LV SV Index:   24 LVOT Area:     3.46 cm  LEFT ATRIUM           Index LA diam:      4.30 cm 2.03 cm/m LA Vol (A2C): 76.4 ml 36.09 ml/m  AORTIC VALVE                    PULMONIC VALVE AV Area (Vmax):    3.19 cm     PV Vmax:       1.25 m/s AV Area (Vmean):   2.64 cm     PV  Vmean:      86.800 cm/s AV Area (VTI):     2.25 cm     PV VTI:        0.213 m AV Vmax:           128.00 cm/s  PV Peak grad:  6.2 mmHg AV Vmean:          98.600 cm/s  PV Mean grad:  3.0 mmHg AV VTI:            0.222 m AV Peak Grad:      6.6 mmHg AV Mean Grad:      4.0 mmHg LVOT Vmax:         118.00 cm/s LVOT Vmean:        75.100 cm/s LVOT VTI:          0.144 m LVOT/AV VTI ratio: 0.65  AORTA Ao Root diam: 3.40 cm MITRAL VALVE MV Area (PHT): 9.18 cm     SHUNTS MV Area VTI:   2.71 cm     Systemic VTI:  0.14 m MV Peak grad:  4.4 mmHg     Systemic Diam: 2.10 cm MV Mean grad:  2.0 mmHg MV Vmax:       1.05 m/s MV Vmean:      66.1 cm/s MV Decel Time: 83 msec MV E velocity: 101.60 cm/s Dwayne D Callwood MD Electronically signed by Yolonda Kida MD Signature Date/Time: 04/15/2022/2:03:27 PM    Final     Assessment and Plan  66 years old female with past medical history of chronic back pain, degenerative joint disease, COPD, hyperlipidemia, hypertension, history of sleep apnea presented to the hospital with acute on chronic back pain.  She does have a history of  spinal stenosis and degenerative joint disease with hip arthritis as well.  Patient stated worsening pain and for the last 3 days she has not been able to ambulate much.  At baseline he is he is able to ambulate inside the house.    New onset atrial fibrillation with RVR --Patient was given Cardizem bolus in the ED and is currently on Amiodarone gtt --Continue for now.   --2D echocardiogram, showed EG 55-60% no valvular abnormality  --TSH within normal limit  -- cardiology consultation with Dr. Clayborn Bigness -- CHA2DS2-VASc score of 3.  Started on Eliquis  -- received IV metoprolol for elevated heart rate precipitated by pain and anxiety --HR 110-120's increased dose of BB, cont amiodarone   Acute on chronic back pain with ambulatory dysfunction..   --Patient does have history of degenerative joint disease spinal stenosis with neurogenic claudication.  -- We will continue supportive care at this time including physical therapy evaluation, warm compression , muscle relaxants.  --Continue BuSpar, Cymbalta, Atarax, gabapentin, baclofen. -- Patient states that she takes gabapentin 800 mg twice daily at home.   --We will add low-dose oxycodone at this time due to intractable pain. -- CT lumbar spine nothing acute. Patient was DJD which appears chronic --PT recommends rehab--pt prefers going home   History of hypertension.  --- hold telmisartan and HCTZ for now.  Hyperlipidemia --Continue Lipitor.  History of sleep apnea.   --Uses CPAP at home.  Will resume while in the hospital.   History of COPD.  On albuterol and Spiriva.  We will continue with that.  Denies increasing dyspnea cough or shortness of breath. Advised on smoking cessation   Morbid obesity.  Would benefit from ongoing weight loss.  Patient stated that she has lost some  amount of weight at this time.      DVT Prophylaxis: Eliquis   Procedures: Family communication : son in the room Consults : [facility] cardiology CODE STATUS: full DVT Prophylaxis : eliquis Level of care: Progressive Status is: Inpatient Remains inpatient appropriate because: a fib with RVR new onset and back pain HR up--adjusting Rate blocking agents     TOTAL TIME TAKING CARE OF THIS PATIENT: 35 minutes.  >50% time spent on counselling and coordination of care  Note: This dictation was prepared with Dragon dictation along with smaller phrase technology. Any transcriptional errors that result from this process are unintentional.  Fritzi Mandes M.D    [facility]    CC: Primary care physician; Venita Lick, NP

## 2022-04-16 NOTE — Progress Notes (Signed)
Pt weaned to room air - HR fluctuate from 110s-150s throughout shift.  Amio drip discontinued - PO administered prior to amio gtt d/c.  MD notified of HR.  Will continue to monitor.

## 2022-04-16 NOTE — TOC Progression Note (Signed)
Transition of Care [facility]) - Progression Note    Patient Details  Name: [name] MRN: [id number] Date of Birth: July 22, 1956  [facility]) CM/SW Contact  Laurena Slimmer, RN Phone Number: 04/16/2022, 2:51 PM  Clinical Narrative:    Spoke with patient regarding recommendations. Patient refusing SNF agreeable to [facility]. Does not have a preference. Referral sent to [facility].         Expected Discharge Plan and Services                                                 Social Determinants of Health (SDOH) Interventions    Readmission Risk Interventions     View : No data to display.

## 2022-04-16 NOTE — Progress Notes (Signed)
Patient refuses. Patient states she will be going home tomorrow and do not want to try our unit. No distress noted.

## 2022-04-17 ENCOUNTER — Ambulatory Visit: Payer: Self-pay

## 2022-04-17 DIAGNOSIS — I4891 Unspecified atrial fibrillation: Secondary | ICD-10-CM

## 2022-04-17 MED ORDER — NICOTINE 14 MG/24HR TD PT24
14.0000 mg | MEDICATED_PATCH | Freq: Every day | TRANSDERMAL | Status: DC
  Administered 2022-04-17: 14 mg via TRANSDERMAL
  Filled 2022-04-17 (×2): qty 1

## 2022-04-17 MED ORDER — METOPROLOL TARTRATE 25 MG PO TABS
37.5000 mg | ORAL_TABLET | Freq: Two times a day (BID) | ORAL | Status: DC
  Administered 2022-04-17: 37.5 mg via ORAL
  Filled 2022-04-17: qty 2

## 2022-04-17 MED ORDER — METOPROLOL TARTRATE 37.5 MG PO TABS: 37.5000 mg | ORAL_TABLET | Freq: Two times a day (BID) | ORAL | 1 refills | Status: DC

## 2022-04-17 MED ORDER — AMIODARONE HCL 200 MG PO TABS: 200.0000 mg | ORAL_TABLET | Freq: Two times a day (BID) | ORAL | 1 refills | Status: DC

## 2022-04-17 MED ORDER — APIXABAN 5 MG PO TABS: 5.0000 mg | ORAL_TABLET | Freq: Two times a day (BID) | ORAL | 1 refills | Status: AC

## 2022-04-17 MED ORDER — TRAMADOL HCL 50 MG PO TABS: 50.0000 mg | ORAL_TABLET | Freq: Three times a day (TID) | ORAL | 0 refills | Status: DC | PRN

## 2022-04-17 NOTE — Telephone Encounter (Signed)
Patient was discharged from the hospital and states her son is going to pick up her medication but she is experiencing back pain. Patient scheduled a hospital follow up for   Called pt - [facility] to return call

## 2022-04-17 NOTE — Telephone Encounter (Signed)
Patient was discharged from the hospital and states her son is going to pick up her medication but she is experiencing back pain. Patient scheduled a hospital follow up for   Called pt - [facility]

## 2022-04-17 NOTE — Progress Notes (Signed)
Pt. Is discharging home with family, education provided with acknowledgement of understanding. V/S are stable and pt. Is very ready to go home. All questions answered.

## 2022-04-17 NOTE — Telephone Encounter (Signed)
Patient was discharged from the hospital and states her son is going to pick up her medication but she is experiencing back pain. Patient scheduled a hospital follow up for   Called pt - [facility]

## 2022-04-17 NOTE — TOC Transition Note (Addendum)
Transition of Care [facility]) - CM/SW Discharge Note   Patient Details  Name: [name] MRN: [id number] Date of Birth: 1956/07/28  Transition of Care [facility]) CM/SW Contact:  Laurena Slimmer, RN Phone Number: 04/17/2022, 1:21 PM   Clinical Narrative:    Patient discharge ordered received. Patient referral for [facility] PT accepted by [facility]. [employer] representative will contact patient regarding initiation of services.          Patient Goals and CMS Choice        Discharge Placement                       Discharge Plan and Services                                     Social Determinants of Health (SDOH) Interventions     Readmission Risk Interventions     View : No data to display.

## 2022-04-17 NOTE — Discharge Summary (Signed)
Physician Discharge Summary   Patient: [name] MRN: [id number] DOB: May 07, 1956  Admit date:     04/14/2022  Discharge date: 04/17/22  Discharge Physician: Fritzi Mandes   PCP: Venita Lick, NP   Recommendations at discharge:    F/u Dr Clayborn Bigness on the given appt F/u PCP in 1-2 weeks  Discharge Diagnoses: Afib with RVR--new Acute on Chronic back pain--DJD Chronic anxiety   Hospital Course: 66 years old female with past medical history of chronic back pain, degenerative joint disease, COPD, hyperlipidemia, hypertension, history of sleep apnea presented to the hospital with acute on chronic back pain.  She does have a history of  spinal stenosis and degenerative joint disease with hip arthritis as well.  Patient stated worsening pain and for the last 3 days she has not been able to ambulate much.  At baseline he is he is able to ambulate inside the house.     New onset atrial fibrillation with RVR --Patient was given Cardizem bolus in the ED and is currently on Amiodarone gtt--on po amiodarone and po BB --2D echocardiogram, showed EG 55-60% no valvular abnormality  --TSH within normal limit  -- cardiology consultation with Dr. Clayborn Bigness -- CHA2DS2-VASc score of 3. Started on Eliquis  ---HR 110-120's increased dose of BB, cont amiodarone   Acute on chronic back pain with ambulatory dysfunction..   --Patient does have history of degenerative joint disease spinal stenosis with neurogenic claudication.  -- We will continue supportive care at this time including physical therapy evaluation, warm compression , muscle relaxants.  --Continue BuSpar, Cymbalta, Atarax, gabapentin, baclofen. -- Patient states that she takes gabapentin 800 mg twice daily at home.   --We will add low-dose ultram at this time due to intractable pain. -- CT lumbar spine nothing acute. Patient was DJD which appears chronic --PT recommends rehab--pt prefers going home with HHPT   History of hypertension.   --- hold telmisartan and HCTZ for now. --cont BB  Hyperlipidemia --Continue Lipitor.  History of sleep apnea.   --Uses CPAP at home.     History of COPD.  On albuterol and Spiriva.  Advised on smoking cessation   Morbid obesity.  Would benefit from ongoing weight loss.  Patient stated that she has lost some amount of weight at this time.    Pt very eager to go home. Son at bedside. [place] form cardiology standpoint for d/c  DVT Prophylaxis: Eliquis       Consultants: [facility] cardiology Procedures performed: none  Disposition: Home Diet recommendation:  Discharge Diet Orders (From admission, onward)     Start     Ordered   04/17/22 0000  Diet - low sodium heart healthy        04/17/22 1029           Cardiac diet DISCHARGE MEDICATION: Allergies as of 04/17/2022   No Known Allergies      Medication List     STOP taking these medications    aspirin 81 MG tablet   telmisartan-hydrochlorothiazide 40-12.5 MG tablet Commonly known as: MICARDIS HCT       TAKE these medications    acetaminophen 500 MG tablet Commonly known as: TYLENOL Take 1,000 mg by mouth in the morning and at bedtime.   albuterol 108 (90 Base) MCG/ACT inhaler Commonly known as: ProAir HFA Inhale 2 puffs into the lungs every 6 (six) hours as needed for wheezing or shortness of breath.   amiodarone 200 MG tablet Commonly known as: PACERONE Take 1  tablet (200 mg total) by mouth 2 (two) times daily. From 04/21/22--start taking 1 tab (200 mg ) daily   apixaban 5 MG Tabs tablet Commonly known as: ELIQUIS Take 1 tablet (5 mg total) by mouth 2 (two) times daily.   ascorbic acid 500 MG tablet Commonly known as: VITAMIN C Take 500 mg by mouth daily.   atorvastatin 80 MG tablet Commonly known as: LIPITOR Take 1 tablet (80 mg total) by mouth daily.   baclofen 10 MG tablet Commonly known as: LIORESAL Take 0.5 tablets (5 mg total) by mouth 3 (three) times daily.   busPIRone 10 MG  tablet Commonly known as: BUSPAR Take 2 tablets (20 mg total) by mouth 3 (three) times daily.   CALCIUM + D PO Take 1,000 mg by mouth daily.   DULoxetine 60 MG capsule Commonly known as: CYMBALTA Take 1 capsule (60 mg total) by mouth daily.   furosemide 20 MG tablet Commonly known as: LASIX Take 1 tablet (20 mg total) by mouth daily.   gabapentin 800 MG tablet Commonly known as: NEURONTIN Take 800 mg by mouth in the morning and at bedtime.   hydrOXYzine 10 MG tablet Commonly known as: ATARAX Take 1 tablet (10 mg total) by mouth 3 (three) times daily as needed.   loratadine 10 MG tablet Commonly known as: CLARITIN Take 10 mg by mouth daily.   Melatonin 10 MG Tabs Take 1 tablet by mouth at bedtime.   Metoprolol Tartrate 37.5 MG Tabs Take 37.5 mg by mouth 2 (two) times daily.   multivitamin with minerals tablet Take 1 tablet by mouth daily.   omeprazole 40 MG capsule Commonly known as: PRILOSEC Take 1 capsule (40 mg total) by mouth daily.   potassium chloride SA 20 MEQ tablet Commonly known as: KLOR-CON M Take 1 tablet (20 mEq total) by mouth daily.   Semaglutide (1 MG/DOSE) 4 MG/3ML Sopn Inject 1 mg as directed once a week.   Spiriva Respimat 2.5 MCG/ACT Aers Generic drug: Tiotropium Bromide Monohydrate INHALE 2 SPRAY(S) BY MOUTH ONCE DAILY   traMADol 50 MG tablet Commonly known as: ULTRAM Take 1 tablet (50 mg total) by mouth every 8 (eight) hours as needed for moderate pain.   triamcinolone cream 0.1 % Commonly known as: KENALOG Apply 1 application topically 2 (two) times daily.   vitamin B-12 1000 MCG tablet Commonly known as: CYANOCOBALAMIN Take 1,000 mcg by mouth daily.   Vitamin D 50 MCG (2000 UT) Caps Take by mouth.        Follow-up Information     Yolonda Kida, MD. Go in 1 week(s).   Specialties: Cardiology, Internal Medicine Why: 05/12/22 2:45pm Contact information: [place] [address] [phone]          Venita Lick, NP. Schedule an appointment as soon as possible for a visit in 1 week(s).   Specialty: Nurse Practitioner Why: hopsital f/u Contact information: [place] [address] [phone]                Discharge Exam: Danley Danker Weights   04/15/22 0500 04/16/22 0500 04/17/22 0405  Weight: 109.2 kg 111.1 kg 108.4 kg     Condition at discharge: fair  The results of significant diagnostics from this hospitalization (including imaging, microbiology, ancillary and laboratory) are listed below for reference.   Imaging Studies: CT LUMBAR SPINE W CONTRAST  Result Date: 04/15/2022 CLINICAL DATA:  Chronic back pain, spinal stenosis, hypotension EXAM: CT LUMBAR SPINE WITH  CONTRAST TECHNIQUE: Multidetector CT imaging of the lumbar spine was performed with intravenous contrast administration. RADIATION DOSE REDUCTION: This exam was performed according to the departmental dose-optimization program which includes automated exposure control, adjustment of the mA and/or kV according to patient size and/or use of iterative reconstruction technique. CONTRAST:  90m OMNIPAQUE IOHEXOL 300 MG/ML  SOLN COMPARISON:  CT from previous day FINDINGS: Segmentation: 5 non rib-bearing lumbar segments assigned L1-L5. Alignment: Normal Vertebrae: No acute fracture or focal pathologic process. Paraspinal and other soft tissues: Moderate calcified aortoiliac atheromatous plaque. Stable right adrenal mass with coarse calcifications. Distended urinary bladder. Remainder visualized paraspinal soft tissues unremarkable. No areas of unexpected enhancement after IV contrast administration. Disc levels: T11-12: Moderate narrowing of the interspace with discogenic sclerosis in the adjacent vertebral bodies, and endplate spurring. T12-L1: Minimal anterior endplate spurring LI6-9 Unremarkable L2-3: Mild disc bulge.  Early left facet DJD. L3-4: Mild disc bulge.  Early facet DJD left greater than right.  L4-5: Moderate narrowing views face with moderate vacuum phenomenon, and discogenic sclerosis in the adjacent vertebral bodies. Broad posterior disc bulge with osteophytic ridging. Bilateral facet DJD contributing to foraminal encroachment right greater than left. L5-S1: Moderate bilateral facet DJD. No severe osseous foraminal stenosis. IMPRESSION: 1. No acute findings. 2. 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 3. Bilateral foraminal encroachment L4-5 right worse than left. 4.  Aortic Atherosclerosis (ICD10-170.0).  For Electronically Signed   By: DLucrezia EuropeM.D.   On: 04/15/2022 11:45   CT ABDOMEN PELVIS W CONTRAST  Result Date: 04/14/2022 CLINICAL DATA:  Abdominal and back pain, hypotension, tachycardia, EXAM: CT ABDOMEN AND PELVIS WITH CONTRAST TECHNIQUE: Multidetector CT imaging of the abdomen and pelvis was performed using the standard protocol following bolus administration of intravenous contrast. RADIATION DOSE REDUCTION: This exam was performed according to the departmental dose-optimization program which includes automated exposure control, adjustment of the mA and/or kV according to patient size and/or use of iterative reconstruction technique. CONTRAST:  867mOMNIPAQUE IOHEXOL 300 MG/ML SOLN are IV. No oral contrast. COMPARISON:  08/12/2015 FINDINGS: Lower chest: Lung bases clear Hepatobiliary: 12 mm gallstone within gallbladder. No gallbladder wall thickening or biliary dilatation. Liver normal appearance. Pancreas: Normal appearance Spleen: Normal appearance Adrenals/Urinary Tract: LEFT adrenal gland normal appearance. RIGHT adrenal mass 3.3 cm greatest diameter, previously 3.1 cm, 38 HU on portal venous phase imaging, 26 HU on delayed images now containing calcifications. Kidneys, ureters, and bladder normal appearance. Stomach/Bowel: Transverse colon and small bowel loops extend to a large umbilical hernia. Appendix surgically absent by history.  Diverticulosis of sigmoid colon. Stomach and bowel loops otherwise normal appearance. No evidence of bowel wall thickening or obstruction. Vascular/Lymphatic: Atherosclerotic calcifications aorta iliac arteries without aneurysm. Periportal and celiac axis adenopathy including 15 mm portal caval node image 17, 11 mm celiac node image 17, 16 mm portacaval node image 23, 12 mm gastrohepatic ligament node image 13. These appear new or increased in sizes versus previous exam. Reproductive: Uterus surgically absent.  Atrophic ovaries. Other: No free air or free fluid.  No inflammatory process. Musculoskeletal: Degenerative disc disease changes thoracolumbar spine with degenerative changes of RIGHT hip joint as well. IMPRESSION: Large umbilical hernia containing nonobstructed transverse colon and small bowel loops. Cholelithiasis without evidence of cholecystitis. Sigmoid diverticulosis without evidence of diverticulitis. RIGHT adrenal mass 3.3 cm greatest diameter, previously 3.1 cm; stability for over 6 years is indicative of a probably benign lesion and no follow-up imaging is recommended. New or increased  sizes of upper abdominal lymph nodes, nonspecific, could be reactive in the setting of prior infection or inflammation, or due to either lymphoma or metastatic disease. Consider follow-up PET-CT imaging to assess metabolic activity of these nodes; if CT is not performed, would perform short-term follow-up CT in 3 months to reassess. Aortic Atherosclerosis (ICD10-I70.0). Electronically Signed   By: Lavonia Dana M.D.   On: 04/14/2022 15:49   ECHOCARDIOGRAM COMPLETE  Result Date: 04/15/2022    ECHOCARDIOGRAM REPORT   Patient Name:   JARETSSI KRAKER Date of Exam: 04/15/2022 Medical Rec #:  [id number]       Height:       64.0 in Accession #:    [id number]      Weight:       240.7 lb Date of Birth:  November 29, 1956       BSA:          2.117 m Patient Age:    74 years        BP:           119/73 mmHg Patient Gender: F                HR:           150 bpm. Exam Location:  [facility] Procedure: 2D Echo, Color Doppler and Cardiac Doppler Indications:     I48.91 Atrial fibrillation  History:         Patient has prior history of Echocardiogram examinations. COPD;                  Risk Factors:Hypertension, Dyslipidemia and Sleep Apnea.  Sonographer:     Charmayne Sheer Referring Phys:  [id number] [place] Diagnosing Phys: Yolonda Kida MD  Sonographer Comments: Technically difficult study due to poor echo windows and suboptimal apical window. Image acquisition challenging due to COPD and due to tachycardia throughout study. IMPRESSIONS  1. Left ventricular ejection fraction, by estimation, is 55 to 60%. The left ventricle has normal function. The left ventricle has no regional wall motion abnormalities. Left ventricular diastolic parameters were normal.  2. Right ventricular systolic function is normal. The right ventricular size is normal.  3. The mitral valve is normal in structure. Trivial mitral valve regurgitation.  4. The aortic valve is grossly normal. Aortic valve regurgitation is not visualized. Conclusion(s)/Recommendation(s): Poor windows for evaluation of left ventricular function by transthoracic echocardiography. Would recommend an alternative means of evaluation. FINDINGS  Left Ventricle: Left ventricular ejection fraction, by estimation, is 55 to 60%. The left ventricle has normal function. The left ventricle has no regional wall motion abnormalities. The left ventricular internal cavity size was normal in size. There is  borderline left ventricular hypertrophy. Left ventricular diastolic parameters were normal. Right Ventricle: The right ventricular size is normal. No increase in right ventricular wall thickness. Right ventricular systolic function is normal. Left Atrium: Left atrial size was normal in size. Right Atrium: Right atrial size was normal in size. Pericardium: There is no evidence of pericardial effusion. Mitral Valve:  The mitral valve is normal in structure. Trivial mitral valve regurgitation. MV peak gradient, 4.4 mmHg. The mean mitral valve gradient is 2.0 mmHg. Tricuspid Valve: The tricuspid valve is normal in structure. Tricuspid valve regurgitation is not demonstrated. Aortic Valve: The aortic valve is grossly normal. Aortic valve regurgitation is not visualized. Aortic valve mean gradient measures 4.0 mmHg. Aortic valve peak gradient measures 6.6 mmHg. Aortic valve area, by VTI measures 2.25 cm. Pulmonic Valve: The pulmonic valve  was normal in structure. Pulmonic valve regurgitation is not visualized. Aorta: The ascending aorta was not well visualized. IAS/Shunts: No atrial level shunt detected by color flow Doppler.  LEFT VENTRICLE PLAX 2D LVIDd:         4.70 cm   Diastology LVIDs:         3.79 cm   LV e' medial:    7.72 cm/s LV PW:         1.20 cm   LV E/e' medial:  13.2 LV IVS:        0.89 cm   LV e' lateral:   11.90 cm/s LVOT diam:     2.10 cm   LV E/e' lateral: 8.5 LV SV:         50 LV SV Index:   24 LVOT Area:     3.46 cm  LEFT ATRIUM           Index LA diam:      4.30 cm 2.03 cm/m LA Vol (A2C): 76.4 ml 36.09 ml/m  AORTIC VALVE                    PULMONIC VALVE AV Area (Vmax):    3.19 cm     PV Vmax:       1.25 m/s AV Area (Vmean):   2.64 cm     PV Vmean:      86.800 cm/s AV Area (VTI):     2.25 cm     PV VTI:        0.213 m AV Vmax:           128.00 cm/s  PV Peak grad:  6.2 mmHg AV Vmean:          98.600 cm/s  PV Mean grad:  3.0 mmHg AV VTI:            0.222 m AV Peak Grad:      6.6 mmHg AV Mean Grad:      4.0 mmHg LVOT Vmax:         118.00 cm/s LVOT Vmean:        75.100 cm/s LVOT VTI:          0.144 m LVOT/AV VTI ratio: 0.65  AORTA Ao Root diam: 3.40 cm MITRAL VALVE MV Area (PHT): 9.18 cm     SHUNTS MV Area VTI:   2.71 cm     Systemic VTI:  0.14 m MV Peak grad:  4.4 mmHg     Systemic Diam: 2.10 cm MV Mean grad:  2.0 mmHg MV Vmax:       1.05 m/s MV Vmean:      66.1 cm/s MV Decel Time: 83 msec MV E velocity:  101.60 cm/s Yolonda Kida MD Electronically signed by Yolonda Kida MD Signature Date/Time: 04/15/2022/2:03:27 PM    Final     Microbiology: Results for orders placed or performed in visit on 03/04/21  Microscopic Examination     Status: Abnormal   Collection Time: 03/04/21  3:58 PM   Urine  Result Value Ref Range Status   WBC, UA 6-10 (A) 0 - 5 /hpf Final   RBC None seen 0 - 2 /hpf Final   Epithelial Cells (non renal) 0-10 0 - 10 /hpf Final   Bacteria, UA Few (A) None seen/Few Final    Labs: CBC: Recent Labs  Lab 04/14/22 1454 04/15/22 0648  WBC 13.9* 15.3*  NEUTROABS 9.0*  --   HGB 12.6 12.5  HCT  39.7 38.7  MCV 90.6 89.4  PLT 360 559   Basic Metabolic Panel: Recent Labs  Lab 04/14/22 1454 04/14/22 2350 04/15/22 0648  NA 140  --  137  K 4.9  --  4.2  CL 107  --  106  CO2 23  --  22  GLUCOSE 115*  --  95  BUN 36*  --  28*  CREATININE 1.37*  --  1.19*  CALCIUM 9.6  --  9.1  MG  --  2.1 2.1   Liver Function Tests: Recent Labs  Lab 04/14/22 1454  AST 26  ALT 22  ALKPHOS 151*  BILITOT 0.7  PROT 6.9  ALBUMIN 2.9*   CBG: Recent Labs  Lab 04/15/22 0416  GLUCAP 97    Discharge time spent: greater than 30 minutes.  Signed: Fritzi Mandes, MD [facility] 04/17/2022

## 2022-04-17 NOTE — Progress Notes (Signed)
[name] NOTE       Patient ID: [name] MRN: [id number] DOB/AGE: 03/22/1956 66 y.o.  Admit date: 04/14/2022 Referring Physician Dr. Fritzi Mandes Primary Physician Marnee Guarneri, NP Primary Cardiologist Dr. Ubaldo Glassing (last seen in 2017) Reason for Consultation new AF with RVR  HPI: [name] is a 5yoF with a PMH of degenerative joint disease, chronic back pain, COPD with ongoing tobacco use, hypertension, hyperlipidemia, obesity, CKD 3, GERD, anxiety and depression, who presented with [facility] ED 04/14/22 with back pain that was more severe than usual x 3 days, limiting her ability to ambulate. She was found to be in AF with RVR, new onset. Cardiology is consulted for further assistance.   Interval History: -no acute events -rate improved somewhat with increase in metoprolol, remains in AF with rates in the 100s-120s and increases with activity  -some back pain today, no chest pain, palpitations. Very eager to go home  -echo with poor quality images but normal LVEF 55-60%  Review of systems complete and found to be negative unless listed above     Past Medical History:  Diagnosis Date   Arthritis    COPD (chronic obstructive pulmonary disease) (HCC)    Enlarged heart    GERD (gastroesophageal reflux disease)    Hiatal hernia    Hx of degenerative disc disease    Hyperlipidemia    Hypertension    Mass    On adrenal gland   Monocytosis    Osteoporosis    Sleep apnea     Past Surgical History:  Procedure Laterality Date   ABDOMINAL HYSTERECTOMY     ACHILLES TENDON REPAIR     Removed bone spur and repaired achilles tendon   APPENDECTOMY     bone fusion in neck     COLONOSCOPY WITH PROPOFOL N/A 01/17/2019   Procedure: COLONOSCOPY WITH PROPOFOL;  Surgeon: Jonathon Bellows, MD;  Location: [facility] ENDOSCOPY;  Service: Gastroenterology;  Laterality: N/A;   HERNIA REPAIR     has not had repair   TONSILLECTOMY      Medications Prior to Admission   Medication Sig Dispense Refill Last Dose   acetaminophen (TYLENOL) 500 MG tablet Take 1,000 mg by mouth in the morning and at bedtime.   04/13/2022   albuterol (PROAIR HFA) 108 (90 Base) MCG/ACT inhaler Inhale 2 puffs into the lungs every 6 (six) hours as needed for wheezing or shortness of breath. 8.5 g 5 Past Month   ascorbic acid (VITAMIN C) 500 MG tablet Take 500 mg by mouth daily.   04/14/2022   aspirin 81 MG tablet Take 81 mg by mouth daily.   04/14/2022   atorvastatin (LIPITOR) 80 MG tablet Take 1 tablet (80 mg total) by mouth daily. 90 tablet 4 04/13/2022   baclofen (LIORESAL) 10 MG tablet Take 0.5 tablets (5 mg total) by mouth 3 (three) times daily. 135 each 4 04/14/2022   busPIRone (BUSPAR) 10 MG tablet Take 2 tablets (20 mg total) by mouth 3 (three) times daily. 180 tablet 12 04/14/2022   Calcium Citrate-Vitamin D (CALCIUM + D PO) Take 1,000 mg by mouth daily.   04/14/2022   Cholecalciferol (VITAMIN D) 50 MCG (2000 UT) CAPS Take by mouth.   04/14/2022   DULoxetine (CYMBALTA) 60 MG capsule Take 1 capsule (60 mg total) by mouth daily. 90 capsule 4 04/14/2022   furosemide (LASIX) 20 MG tablet Take 1 tablet (20 mg total) by mouth daily. 90 tablet 4 04/14/2022   gabapentin (NEURONTIN) 800  MG tablet Take 800 mg by mouth in the morning and at bedtime.   04/14/2022   hydrOXYzine (ATARAX/VISTARIL) 10 MG tablet Take 1 tablet (10 mg total) by mouth 3 (three) times daily as needed. 270 tablet 4 04/14/2022   loratadine (CLARITIN) 10 MG tablet Take 10 mg by mouth daily.   Past Month   Melatonin 10 MG TABS Take 1 tablet by mouth at bedtime.   04/13/2022   Multiple Vitamins-Minerals (MULTIVITAMIN WITH MINERALS) tablet Take 1 tablet by mouth daily.   04/14/2022   omeprazole (PRILOSEC) 40 MG capsule Take 1 capsule (40 mg total) by mouth daily. 90 capsule 4 04/14/2022   potassium chloride SA (KLOR-CON) 20 MEQ tablet Take 1 tablet (20 mEq total) by mouth daily. 90 tablet 4 04/14/2022   telmisartan-hydrochlorothiazide  (MICARDIS HCT) 40-12.5 MG tablet Take 1 tablet by mouth daily. 90 tablet 4 04/14/2022   Tiotropium Bromide Monohydrate (SPIRIVA RESPIMAT) 2.5 MCG/ACT AERS INHALE 2 SPRAY(S) BY MOUTH ONCE DAILY 4 g 5 04/13/2022   vitamin B-12 (CYANOCOBALAMIN) 1000 MCG tablet Take 1,000 mcg by mouth daily.   04/14/2022   Semaglutide, 1 MG/DOSE, 4 MG/3ML SOPN Inject 1 mg as directed once a week. 9 mL 4 04/08/2022   triamcinolone cream (KENALOG) 0.1 % Apply 1 application topically 2 (two) times daily. 30 g 0    Social History   Socioeconomic History   Marital status: Widowed    Spouse name: Not on file   Number of children: Not on file   Years of education: Not on file   Highest education level: Not on file  Occupational History   Occupation: disabled  Tobacco Use   Smoking status: Every Day    Packs/day: 1.00    Years: 48.00    Pack years: 48.00    Types: Cigarettes   Smokeless tobacco: Never   Tobacco comments:    pt states she plans to begin using patch to quit smoking  Vaping Use   Vaping Use: Never used  Substance and Sexual Activity   Alcohol use: No    Alcohol/week: 0.0 standard drinks   Drug use: No   Sexual activity: Never    Partners: Male  Other Topics Concern   Not on file  Social History Narrative   Not on file   Social Determinants of Health   Financial Resource Strain: Low Risk    Difficulty of Paying Living Expenses: Not very hard  Food Insecurity: No Food Insecurity   Worried About Running Out of Food in the Last Year: Never true   [place] in the Last Year: Never true  Transportation Needs: No Transportation Needs   Lack of Transportation (Medical): No   Lack of Transportation (Non-Medical): No  Physical Activity: Inactive   Days of Exercise per Week: 0 days   Minutes of Exercise per Session: 0 min  Stress: No Stress Concern Present   Feeling of Stress : Only a little  Social Connections: Socially Isolated   Frequency of Communication with Friends and Family:  More than three times a week   Frequency of Social Gatherings with Friends and Family: More than three times a week   Attends Religious Services: Never   Marine scientist or Organizations: No   Attends Archivist Meetings: Never   Marital Status: Widowed  Intimate Partner Violence: Not At Risk   Fear of Current or Ex-Partner: No   Emotionally Abused: No   Physically Abused: No   Sexually Abused:  No    Family History  Problem Relation Age of Onset   COPD Father    Heart disease Father    Hyperlipidemia Father    Hypertension Father    Dementia Father    Cancer Sister    Hyperlipidemia Sister    Hypertension Sister    Lymphoma Sister    Migraines Son    Stroke Maternal Grandfather    Heart attack Brother    Cancer Brother        colorectal and liver   Breast cancer Paternal Aunt    Breast cancer Paternal Aunt       PHYSICAL EXAM General: Chronically ill-appearing Caucasian female appearing older than stated age, well nourished, in no acute distress. Sitting upright with legs off bed. Resting tremor to right upper extremity and head  HEENT:  Normocephalic and atraumatic. Neck:  No JVD.  Lungs: Normal respiratory effort on oxygen by nasal cannula. Clear bilaterally to auscultation. No wheezes, crackles, rhonchi.  Heart: tachy irregular rate and rhythm. Normal S1 and S2 without gallops or murmurs. Radial & DP pulses 2+ bilaterally. Abdomen: Obese appearing with ventral hernia present.  Msk: Normal strength and tone for age. Extremities: Warm and well perfused. No clubbing, cyanosis.  No peripheral edema.  Neuro: Alert and oriented X 3. Psych:  Answers questions appropriately.   Labs:   Lab Results  Component Value Date   WBC 15.3 (H) 04/15/2022   HGB 12.5 04/15/2022   HCT 38.7 04/15/2022   MCV 89.4 04/15/2022   PLT 352 04/15/2022    Recent Labs  Lab 04/14/22 1454 04/15/22 0648  NA 140 137  K 4.9 4.2  CL 107 106  CO2 23 22  BUN 36* 28*   CREATININE 1.37* 1.19*  CALCIUM 9.6 9.1  PROT 6.9  --   BILITOT 0.7  --   ALKPHOS 151*  --   ALT 22  --   AST 26  --   GLUCOSE 115* 95    No results found for: CKTOTAL, CKMB, CKMBINDEX, TROPONINI  Lab Results  Component Value Date   CHOL 123 04/15/2022   CHOL 150 12/10/2021   CHOL 179 09/03/2021   Lab Results  Component Value Date   HDL 29 (L) 04/15/2022   HDL 47 12/10/2021   HDL 50 09/03/2021   Lab Results  Component Value Date   LDLCALC 64 04/15/2022   LDLCALC 78 12/10/2021   LDLCALC 101 (H) 09/03/2021   Lab Results  Component Value Date   TRIG 148 04/15/2022   TRIG 142 12/10/2021   TRIG 160 (H) 09/03/2021   Lab Results  Component Value Date   CHOLHDL 4.2 04/15/2022   No results found for: LDLDIRECT    Radiology: CT LUMBAR SPINE W CONTRAST  Result Date: 04/15/2022 CLINICAL DATA:  Chronic back pain, spinal stenosis, hypotension EXAM: CT LUMBAR SPINE WITH CONTRAST TECHNIQUE: Multidetector CT imaging of the lumbar spine was performed with intravenous contrast administration. RADIATION DOSE REDUCTION: This exam was performed according to the departmental dose-optimization program which includes automated exposure control, adjustment of the mA and/or kV according to patient size and/or use of iterative reconstruction technique. CONTRAST:  82m OMNIPAQUE IOHEXOL 300 MG/ML  SOLN COMPARISON:  CT from previous day FINDINGS: Segmentation: 5 non rib-bearing lumbar segments assigned L1-L5. Alignment: Normal Vertebrae: No acute fracture or focal pathologic process. Paraspinal and other soft tissues: Moderate calcified aortoiliac atheromatous plaque. Stable right adrenal mass with coarse calcifications. Distended urinary bladder. Remainder visualized paraspinal soft tissues unremarkable.  No areas of unexpected enhancement after IV contrast administration. Disc levels: T11-12: Moderate narrowing of the interspace with discogenic sclerosis in the adjacent vertebral bodies, and endplate  spurring. T12-L1: Minimal anterior endplate spurring I9-5: Unremarkable L2-3: Mild disc bulge.  Early left facet DJD. L3-4: Mild disc bulge.  Early facet DJD left greater than right. L4-5: Moderate narrowing views face with moderate vacuum phenomenon, and discogenic sclerosis in the adjacent vertebral bodies. Broad posterior disc bulge with osteophytic ridging. Bilateral facet DJD contributing to foraminal encroachment right greater than left. L5-S1: Moderate bilateral facet DJD. No severe osseous foraminal stenosis. IMPRESSION: 1. No acute findings. 2. 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 3. Bilateral foraminal encroachment L4-5 right worse than left. 4.  Aortic Atherosclerosis (ICD10-170.0).  For Electronically Signed   By: Lucrezia Europe M.D.   On: 04/15/2022 11:45   CT ABDOMEN PELVIS W CONTRAST  Result Date: 04/14/2022 CLINICAL DATA:  Abdominal and back pain, hypotension, tachycardia, EXAM: CT ABDOMEN AND PELVIS WITH CONTRAST TECHNIQUE: Multidetector CT imaging of the abdomen and pelvis was performed using the standard protocol following bolus administration of intravenous contrast. RADIATION DOSE REDUCTION: This exam was performed according to the departmental dose-optimization program which includes automated exposure control, adjustment of the mA and/or kV according to patient size and/or use of iterative reconstruction technique. CONTRAST:  30m OMNIPAQUE IOHEXOL 300 MG/ML SOLN are IV. No oral contrast. COMPARISON:  08/12/2015 FINDINGS: Lower chest: Lung bases clear Hepatobiliary: 12 mm gallstone within gallbladder. No gallbladder wall thickening or biliary dilatation. Liver normal appearance. Pancreas: Normal appearance Spleen: Normal appearance Adrenals/Urinary Tract: LEFT adrenal gland normal appearance. RIGHT adrenal mass 3.3 cm greatest diameter, previously 3.1 cm, 38 HU on portal venous phase imaging, 26 HU on delayed images now containing calcifications.  Kidneys, ureters, and bladder normal appearance. Stomach/Bowel: Transverse colon and small bowel loops extend to a large umbilical hernia. Appendix surgically absent by history. Diverticulosis of sigmoid colon. Stomach and bowel loops otherwise normal appearance. No evidence of bowel wall thickening or obstruction. Vascular/Lymphatic: Atherosclerotic calcifications aorta iliac arteries without aneurysm. Periportal and celiac axis adenopathy including 15 mm portal caval node image 17, 11 mm celiac node image 17, 16 mm portacaval node image 23, 12 mm gastrohepatic ligament node image 13. These appear new or increased in sizes versus previous exam. Reproductive: Uterus surgically absent.  Atrophic ovaries. Other: No free air or free fluid.  No inflammatory process. Musculoskeletal: Degenerative disc disease changes thoracolumbar spine with degenerative changes of RIGHT hip joint as well. IMPRESSION: Large umbilical hernia containing nonobstructed transverse colon and small bowel loops. Cholelithiasis without evidence of cholecystitis. Sigmoid diverticulosis without evidence of diverticulitis. RIGHT adrenal mass 3.3 cm greatest diameter, previously 3.1 cm; stability for over 6 years is indicative of a probably benign lesion and no follow-up imaging is recommended. New or increased sizes of upper abdominal lymph nodes, nonspecific, could be reactive in the setting of prior infection or inflammation, or due to either lymphoma or metastatic disease. Consider follow-up PET-CT imaging to assess metabolic activity of these nodes; if CT is not performed, would perform short-term follow-up CT in 3 months to reassess. Aortic Atherosclerosis (ICD10-I70.0). Electronically Signed   By: MLavonia DanaM.D.   On: 04/14/2022 15:49   ECHOCARDIOGRAM COMPLETE  Result Date: 04/15/2022    ECHOCARDIOGRAM REPORT   Patient Name:   Kathleen STRYCHARZDate of Exam: 04/15/2022 Medical Rec #:  [id number]      Height:  64.0 in Accession #:     [id number]      Weight:       240.7 lb Date of Birth:  1956-03-27       BSA:          2.117 m Patient Age:    51 years        BP:           119/73 mmHg Patient Gender: F               HR:           150 bpm. Exam Location:  [facility] Procedure: 2D Echo, Color Doppler and Cardiac Doppler Indications:     I48.91 Atrial fibrillation  History:         Patient has prior history of Echocardiogram examinations. COPD;                  Risk Factors:Hypertension, Dyslipidemia and Sleep Apnea.  Sonographer:     Charmayne Sheer Referring Phys:  [id number] [place] Diagnosing Phys: Yolonda Kida MD  Sonographer Comments: Technically difficult study due to poor echo windows and suboptimal apical window. Image acquisition challenging due to COPD and due to tachycardia throughout study. IMPRESSIONS  1. Left ventricular ejection fraction, by estimation, is 55 to 60%. The left ventricle has normal function. The left ventricle has no regional wall motion abnormalities. Left ventricular diastolic parameters were normal.  2. Right ventricular systolic function is normal. The right ventricular size is normal.  3. The mitral valve is normal in structure. Trivial mitral valve regurgitation.  4. The aortic valve is grossly normal. Aortic valve regurgitation is not visualized. Conclusion(s)/Recommendation(s): Poor windows for evaluation of left ventricular function by transthoracic echocardiography. Would recommend an alternative means of evaluation. FINDINGS  Left Ventricle: Left ventricular ejection fraction, by estimation, is 55 to 60%. The left ventricle has normal function. The left ventricle has no regional wall motion abnormalities. The left ventricular internal cavity size was normal in size. There is  borderline left ventricular hypertrophy. Left ventricular diastolic parameters were normal. Right Ventricle: The right ventricular size is normal. No increase in right ventricular wall thickness. Right ventricular systolic function is  normal. Left Atrium: Left atrial size was normal in size. Right Atrium: Right atrial size was normal in size. Pericardium: There is no evidence of pericardial effusion. Mitral Valve: The mitral valve is normal in structure. Trivial mitral valve regurgitation. MV peak gradient, 4.4 mmHg. The mean mitral valve gradient is 2.0 mmHg. Tricuspid Valve: The tricuspid valve is normal in structure. Tricuspid valve regurgitation is not demonstrated. Aortic Valve: The aortic valve is grossly normal. Aortic valve regurgitation is not visualized. Aortic valve mean gradient measures 4.0 mmHg. Aortic valve peak gradient measures 6.6 mmHg. Aortic valve area, by VTI measures 2.25 cm. Pulmonic Valve: The pulmonic valve was normal in structure. Pulmonic valve regurgitation is not visualized. Aorta: The ascending aorta was not well visualized. IAS/Shunts: No atrial level shunt detected by color flow Doppler.  LEFT VENTRICLE PLAX 2D LVIDd:         4.70 cm   Diastology LVIDs:         3.79 cm   LV e' medial:    7.72 cm/s LV PW:         1.20 cm   LV E/e' medial:  13.2 LV IVS:        0.89 cm   LV e' lateral:   11.90 cm/s LVOT diam:  2.10 cm   LV E/e' lateral: 8.5 LV SV:         50 LV SV Index:   24 LVOT Area:     3.46 cm  LEFT ATRIUM           Index LA diam:      4.30 cm 2.03 cm/m LA Vol (A2C): 76.4 ml 36.09 ml/m  AORTIC VALVE                    PULMONIC VALVE AV Area (Vmax):    3.19 cm     PV Vmax:       1.25 m/s AV Area (Vmean):   2.64 cm     PV Vmean:      86.800 cm/s AV Area (VTI):     2.25 cm     PV VTI:        0.213 m AV Vmax:           128.00 cm/s  PV Peak grad:  6.2 mmHg AV Vmean:          98.600 cm/s  PV Mean grad:  3.0 mmHg AV VTI:            0.222 m AV Peak Grad:      6.6 mmHg AV Mean Grad:      4.0 mmHg LVOT Vmax:         118.00 cm/s LVOT Vmean:        75.100 cm/s LVOT VTI:          0.144 m LVOT/AV VTI ratio: 0.65  AORTA Ao Root diam: 3.40 cm MITRAL VALVE MV Area (PHT): 9.18 cm     SHUNTS MV Area VTI:   2.71 cm      Systemic VTI:  0.14 m MV Peak grad:  4.4 mmHg     Systemic Diam: 2.10 cm MV Mean grad:  2.0 mmHg MV Vmax:       1.05 m/s MV Vmean:      66.1 cm/s MV Decel Time: 83 msec MV E velocity: 101.60 cm/s Yolonda Kida MD Electronically signed by Yolonda Kida MD Signature Date/Time: 04/15/2022/2:03:27 PM    Final     ECHO 12/2015 LV EF: 60% -   65%   -------------------------------------------------------------------  Indications:      Dyspnea 786.09.   -------------------------------------------------------------------  Study Conclusions   - Procedure narrative: Transthoracic echocardiography. The study    was technically difficult.  - Left ventricle: The cavity size was normal. Systolic function was    normal. The estimated ejection fraction was in the range of 60%    to 65%.  - Aortic valve: Valve area (Vmax): 2.66 cm^2.   Transthoracic echocardiography.  M-mode, complete 2D, spectral  Doppler, and color Doppler.  Birthdate:  Patient birthdate:  1956-04-14.  Age:  Patient is 66 yr old.  Sex:  Gender: female.  BMI: 47.3 kg/m^2.  Patient status:  Outpatient.  Study date:  Study  date: 12/26/2015. Study time: 11:06 AM.   -------------------------------------------------------------------   -------------------------------------------------------------------  Left ventricle:  The cavity size was normal. Systolic function was  normal. The estimated ejection fraction was in the range of 60% to  65%.   -------------------------------------------------------------------  Aortic valve:   Structurally normal valve.   Cusp separation was  normal.  Doppler:  Transvalvular velocity was within the normal  range. There was no stenosis. There was no regurgitation.    Peak  velocity ratio of LVOT to aortic valve: 0.77. Valve  area (Vmax):  2.66 cm^2. Indexed valve area (Vmax): 1.11 cm^2/m^2.    Peak  gradient (S): 9 mm Hg.   -------------------------------------------------------------------   Aorta:  The aorta was normal, not dilated, and non-diseased.   -------------------------------------------------------------------  Mitral valve:   Structurally normal valve.   Leaflet separation was  normal.  Doppler:  Transvalvular velocity was within the normal  range. There was no evidence for stenosis. There was no  regurgitation.   -------------------------------------------------------------------  Left atrium:  The atrium was normal in size.   -------------------------------------------------------------------  Right ventricle:  The cavity size was normal. Wall thickness was  normal. Systolic function was normal.   -------------------------------------------------------------------  Tricuspid valve:   Doppler:  There was trivial regurgitation.     -------------------------------------------------------------------  Right atrium:  The atrium was normal in size.     -------------------------------------------------------------------  Post procedure conclusions  Ascending Aorta:   - The aorta was normal, not dilated, and non-diseased.   TELEMETRY reviewed by me: A-fib with RVR, rates between 130-160s  EKG reviewed by me: A-fib rate 134  ASSESSMENT AND PLAN:  [name] is a 54yoF with a PMH of degenerative joint disease, chronic back pain, COPD with ongoing tobacco use, hypertension, hyperlipidemia, obesity, CKD 3, GERD, anxiety and depression, who presented with [facility] ED 04/14/22 with back pain that was more severe than usual x 3 days, limiting her ability to ambulate. She was found to be in AF with RVR, new onset. Cardiology is consulted for further assistance.   #New onset atrial fibrillation with RVR #Acute on chronic low back pain Patient presents with a 3-day history of worsening atraumatic lower back pain from her baseline and was found to be in A-fib with RVR on admission.  She was initially started on a diltiazem drip but it had to be discontinued due to  hypotension and is now on amiodarone drip with difficult to control heart rates.  Her electrolytes and thyroid panel are within normal limits.  -Continue pain management and other etiologies of increased adrenergic tone -S/p diltiazem gtt., discontinued due to hypotension -s/p amiodarone infusion, continue amiodarone '200mg'$  PO BID x 5 days then '200mg'$  daily therafter.  -s/p  IV metoprolol 5 mg x 1, consolidate metoprolol tartrate to 37.'5mg'$  BID for rate control  -CHA2DS2-VASc 3, was started on Eliquis 5 mg twice daily for stroke risk reduction -Echocardiogram complete (poor quality study) with preserved LVEF -ok for discharge from a cardiac standpoint today with outpatient follow up with Dr. Clayborn Bigness.   #Hypertension #Hyperlipidemia Continue home atorvastatin, Lasix 20 mg daily for peripheral edema  #COPD #Tobacco use Recommend complete cessation from smoking, patient has tried in the past to quit and is motivated to try again.  #OSA -Continue CPAP nightly  This patient's plan of care was discussed and created with Dr. Clayborn Bigness and he is in agreement.  Signed: Tristan Schroeder , PA-C 04/17/2022, 8:06 AM [facility] Card[facility]

## 2022-04-19 NOTE — Patient Instructions (Signed)
Atrial Fibrillation  Atrial fibrillation is a type of heartbeat that is irregular or fast. If you have this condition, your heart beats without any order. This makes it hard for your heart to pump blood in a normal way. Atrial fibrillation may come and go, or it may become a long-lasting problem. If this condition is not treated, it can put you at higher risk for stroke, heart failure, and other heart problems. What are the causes? This condition may be caused by diseases that damage the heart. They include: High blood pressure. Heart failure. Heart valve disease. Heart surgery. Other causes include: Diabetes. Thyroid disease. Being overweight. Kidney disease. Sometimes the cause is not known. What increases the risk? You are more likely to develop this condition if: You are older. You smoke. You exercise often and very hard. You have a family history of this condition. You are a man. You use drugs. You drink a lot of alcohol. You have lung conditions, such as emphysema, pneumonia, or COPD. You have sleep apnea. What are the signs or symptoms? Common symptoms of this condition include: A feeling that your heart is beating very fast. Chest pain or discomfort. Feeling short of breath. Suddenly feeling light-headed or weak. Getting tired easily during activity. Fainting. Sweating. In some cases, there are no symptoms. How is this treated? Treatment for this condition depends on underlying conditions and how you feel when you have atrial fibrillation. They include: Medicines to: Prevent blood clots. Treat heart rate or heart rhythm problems. Using devices, such as a pacemaker, to correct heart rhythm problems. Doing surgery to remove the part of the heart that sends bad signals. Closing an area where clots can form in the heart (left atrial appendage). In some cases, your doctor will treat other underlying conditions. Follow these instructions at home: Medicines Take  over-the-counter and prescription medicines only as told by your doctor. Do not take any new medicines without first talking to your doctor. If you are taking blood thinners: Talk with your doctor before you take any medicines that have aspirin or NSAIDs, such as ibuprofen, in them. Take your medicine exactly as told by your doctor. Take it at the same time each day. Avoid activities that could hurt or bruise you. Follow instructions about how to prevent falls. Wear a bracelet that says you are taking blood thinners. Or, carry a card that lists what medicines you take. Lifestyle     Do not use any products that have nicotine or tobacco in them. These include cigarettes, e-cigarettes, and chewing tobacco. If you need help quitting, ask your doctor. Eat heart-healthy foods. Talk with your doctor about the right eating plan for you. Exercise regularly as told by your doctor. Do not drink alcohol. Lose weight if you are overweight. Do not use drugs, including cannabis. General instructions If you have a condition that causes breathing to stop for a short period of time (apnea), treat it as told by your doctor. Keep a healthy weight. Do not use diet pills unless your doctor says they are safe for you. Diet pills may make heart problems worse. Keep all follow-up visits as told by your doctor. This is important. Contact a doctor if: You notice a change in the speed, rhythm, or strength of your heartbeat. You are taking a blood-thinning medicine and you get more bruising. You get tired more easily when you move or exercise. You have a sudden change in weight. Get help right away if:  You have pain in   your chest or your belly (abdomen). You have trouble breathing. You have side effects of blood thinners, such as blood in your vomit, poop (stool), or pee (urine), or bleeding that cannot stop. You have any signs of a stroke. "BE FAST" is an easy way to remember the main warning signs: B -  Balance. Signs are dizziness, sudden trouble walking, or loss of balance. E - Eyes. Signs are trouble seeing or a change in how you see. F - Face. Signs are sudden weakness or loss of feeling in the face, or the face or eyelid drooping on one side. A - Arms. Signs are weakness or loss of feeling in an arm. This happens suddenly and usually on one side of the body. S - Speech. Signs are sudden trouble speaking, slurred speech, or trouble understanding what people say. T - Time. Time to call emergency services. Write down what time symptoms started. You have other signs of a stroke, such as: A sudden, very bad headache with no known cause. Feeling like you may vomit (nausea). Vomiting. A seizure. These symptoms may be an emergency. Do not wait to see if the symptoms will go away. Get medical help right away. Call your local emergency services (911 in the [place] Do not drive yourself to the hospital. Summary Atrial fibrillation is a type of heartbeat that is irregular or fast. You are at higher risk of this condition if you smoke, are older, have diabetes, or are overweight. Follow your doctor's instructions about medicines, diet, exercise, and follow-up visits. Get help right away if you have signs or symptoms of a stroke. Get help right away if you cannot catch your breath, or you have chest pain or discomfort. This information is not intended to replace advice given to you by your health care provider. Make sure you discuss any questions you have with your health care provider. Document Revised: 05/10/2019 Document Reviewed: 05/10/2019 [employer]vier Patient Education  2023 [employer]  

## 2022-04-20 ENCOUNTER — Telehealth: Payer: Self-pay | Admitting: *Deleted

## 2022-04-20 NOTE — Telephone Encounter (Signed)
Transition Care Management Follow-up Telephone Call Date of discharge and from where: [facility] 04-17-2022 How have you been since you were released from the hospital?   Not feeling great .   Any questions or concerns? No  Items Reviewed: Did the pt receive and understand the discharge instructions provided? Yes  Medications obtained and verified? Yes  Other? No  Any new allergies since your discharge? No  Dietary orders reviewed? No Do you have support at home? Yes   Home Care and Equipment/Supplies: Were home health services ordered? not applicable If so, what is the name of the agency?   Has the agency set up a time to come to the patient's home? not applicable Were any new equipment or medical supplies ordered?   What is the name of the medical supply agency?  Were you able to get the supplies/equipment? not applicable Do you have any questions related to the use of the equipment or supplies? No I Functional Questionnaire: (I = Independent and D = Dependent) ADLs: D  Bathing/Dressing- I  Meal Prep- D  Eating- I  Maintaining continence- I  Transferring/Ambulation- I  Managing Meds- D  Follow up appointments reviewed:  PCP Hospital f/u appt confirmed? Yes  Scheduled to see 04-21-2022 on 2:00. [facility] f/u appt confirmed? No  Scheduled   Are transportation arrangements needed? No  If their condition worsens, is the pt aware to call PCP or go to the Emergency Dept.? Yes Was the patient provided with contact information for the PCP's office or ED? Yes Was to pt encouraged to call back with questions or concerns? Yes

## 2022-04-21 ENCOUNTER — Encounter: Payer: Self-pay | Admitting: Nurse Practitioner

## 2022-04-21 ENCOUNTER — Ambulatory Visit (INDEPENDENT_AMBULATORY_CARE_PROVIDER_SITE_OTHER): Payer: PPO | Admitting: Nurse Practitioner

## 2022-04-21 VITALS — BP 101/59 | HR 98 | Temp 98.3°F | Ht 64.0 in | Wt 238.0 lb

## 2022-04-21 DIAGNOSIS — R590 Localized enlarged lymph nodes: Secondary | ICD-10-CM

## 2022-04-21 DIAGNOSIS — I4891 Unspecified atrial fibrillation: Secondary | ICD-10-CM

## 2022-04-21 DIAGNOSIS — E782 Mixed hyperlipidemia: Secondary | ICD-10-CM | POA: Diagnosis not present

## 2022-04-21 DIAGNOSIS — J432 Centrilobular emphysema: Secondary | ICD-10-CM | POA: Diagnosis not present

## 2022-04-21 DIAGNOSIS — M48062 Spinal stenosis, lumbar region with neurogenic claudication: Secondary | ICD-10-CM

## 2022-04-21 DIAGNOSIS — F17210 Nicotine dependence, cigarettes, uncomplicated: Secondary | ICD-10-CM

## 2022-04-21 DIAGNOSIS — G4733 Obstructive sleep apnea (adult) (pediatric): Secondary | ICD-10-CM

## 2022-04-21 DIAGNOSIS — F324 Major depressive disorder, single episode, in partial remission: Secondary | ICD-10-CM

## 2022-04-21 DIAGNOSIS — I10 Essential (primary) hypertension: Secondary | ICD-10-CM

## 2022-04-21 DIAGNOSIS — F419 Anxiety disorder, unspecified: Secondary | ICD-10-CM

## 2022-04-21 DIAGNOSIS — D72821 Monocytosis (symptomatic): Secondary | ICD-10-CM

## 2022-04-21 DIAGNOSIS — N1831 Chronic kidney disease, stage 3a: Secondary | ICD-10-CM

## 2022-04-21 DIAGNOSIS — M47816 Spondylosis without myelopathy or radiculopathy, lumbar region: Secondary | ICD-10-CM

## 2022-04-21 DIAGNOSIS — G894 Chronic pain syndrome: Secondary | ICD-10-CM

## 2022-04-21 MED ORDER — HYDROCODONE-ACETAMINOPHEN 10-325 MG PO TABS: 1.0000 | ORAL_TABLET | Freq: Three times a day (TID) | ORAL | 0 refills | Status: AC | PRN

## 2022-04-21 MED ORDER — VENLAFAXINE HCL ER 75 MG PO CP24: 75.0000 mg | ORAL_CAPSULE | Freq: Every day | ORAL | 4 refills | Status: DC

## 2022-04-21 MED ORDER — DULOXETINE HCL 30 MG PO CPEP: ORAL_CAPSULE | ORAL | 0 refills | Status: DC

## 2022-04-21 MED ORDER — FLUOXETINE HCL 20 MG PO CAPS: 20.0000 mg | ORAL_CAPSULE | Freq: Every day | ORAL | 4 refills | Status: DC

## 2022-04-21 NOTE — Assessment & Plan Note (Signed)
Ongoing.  GFR in the 50's recent visits, recent was 87 in hospital.  Recheck today.  Reminded to avoid NSAIDs.  May need to adjust Gabapentin based on labs today, will review and determine need to reduce -- discussed with patient.  Labs today: CMP and CBC.  Increase fluid intake at home.

## 2022-04-21 NOTE — Assessment & Plan Note (Signed)
Refer to depression plan of care. 

## 2022-04-21 NOTE — Assessment & Plan Note (Signed)
Chronic, ongoing -- continue 100% use of this.  Received new equipment recently.

## 2022-04-21 NOTE — Progress Notes (Signed)
BP (!) 101/59   Pulse 98   Temp 98.3 F (36.8 C) (Oral)   Ht '5\' 4"'$  (1.626 m)   Wt 238 lb (108 kg)   LMP  (LMP Unknown)   SpO2 95%   BMI 40.85 kg/m    Subjective:    Patient ID: [name], female    DOB: [birth year], 66 y.o.   MRN: [id number]  HPI: [name] is a 66 y.o. female  Chief Complaint  Patient presents with   Sciatica    Patient is here for to follow up on Sciatica.    Atrial Fibrillation    Patient states she is having panic attacks every day and says everything triggers her and she is concerned because she says they are about to drive her crazy. Patient sister says the evenings are worse. Patient says she can not even go to the bathroom without a patient attack.    Sister is at bedside with patient today.  Transition [facility] Care Hospital Follow up.  Admitted to hospital on 04/14/22 for acute episode back pain, has chronic underlying and has refused to see pain management -- known spinal stenosis and degenerative disc disease.  Was noted while in ER to have new onset atrial fibrillation with RVR and admitted to ICU with Amiodarone gtt.  Echo was performed noting EF 55-60% with no LV function.  She was started on Eliquis and BB increased + Amiodarone started.  Is to see Dr. Clayborn Bigness outpatient -- is scheduled to see him June 13th.  Reports occasional SOB with walking longer distance, but none while resting.  No palpitations or CP.  Occasional headed pain.    For back pain Gabapentin was continued and they provided short burst of Ultram + ordered home PT as she refused rehab facility.  Has seen pain management before at hospital and did not have success with them, she does agree today to referral to alternative provider.  Does not want to do PT/OT at home, as reports this does not help.  Was sent home with Tramadol for pain, which is helping some + taking two Tylenol with this.  She reports receiving injections in past to back without benefit and PT did not help.  Was  noted to have some new abdominal lymphadenopathy and has ongoing elevation in WBC and lymphocytes  -- seen hematology in past who recommended every 6 month CBC.  Recent imaging in hospital recommended PET CT for new lymphadenopathy noted.  Continues to smoke daily.  Endorses increased panic recently.  Currently taking Duloxetine 60 MG and Buspar 20 MG TID.  Does take Vistaril PRN.  In past has taken Klonopin and Ativan + took Zoloft, Prozac, and Paxil.  In past Prozac had worked well, but they switched her to Effexor and this caused increased panic.    "Hospital Course: 66 years old female with past medical history of chronic back pain, degenerative joint disease, COPD, hyperlipidemia, hypertension, history of sleep apnea presented to the hospital with acute on chronic back pain.  She does have a history of  spinal stenosis and degenerative joint disease with hip arthritis as well.  Patient stated worsening pain and for the last 3 days she has not been able to ambulate much.  At baseline he is he is able to ambulate inside the house.     New onset atrial fibrillation with RVR --Patient was given Cardizem bolus in the ED and is currently on Amiodarone gtt--on po amiodarone and po BB --2D  echocardiogram, showed EG 55-60% no valvular abnormality  --TSH within normal limit  -- cardiology consultation with Dr. Clayborn Bigness -- CHA2DS2-VASc score of 3. Started on Eliquis  ---HR 110-120's increased dose of BB, cont amiodarone   Acute on chronic back pain with ambulatory dysfunction..   --Patient does have history of degenerative joint disease spinal stenosis with neurogenic claudication.  -- We will continue supportive care at this time including physical therapy evaluation, warm compression , muscle relaxants.  --Continue BuSpar, Cymbalta, Atarax, gabapentin, baclofen. -- Patient states that she takes gabapentin 800 mg twice daily at home.   --We will add low-dose ultram at this time due to intractable  pain. -- CT lumbar spine nothing acute. Patient was DJD which appears chronic --PT recommends rehab--pt prefers going home with HHPT   History of hypertension.  --- hold telmisartan and HCTZ for now. --cont BB  Hyperlipidemia --Continue Lipitor.  History of sleep apnea.   --Uses CPAP at home.     History of COPD.  On albuterol and Spiriva.  Advised on smoking cessation   Morbid obesity.  Would benefit from ongoing weight loss.  Patient stated that she has lost some amount of weight at this time."  Hospital/Facility: [facility] D/C Physician: Dr. Posey Pronto D/C Date: 04/17/22  Records Requested: 04/21/22 Records Received: 04/21/22 Records Reviewed: 04/21/22  Diagnoses on Discharge: Afib with RVR--new and Chronic Anxiety  Date of interactive Contact within 48 hours of discharge:  Contact was through: phone  Date of 7 day or 14 day face-to-face visit:    within 7 days  Outpatient Encounter Medications as of 04/21/2022  Medication Sig   acetaminophen (TYLENOL) 500 MG tablet Take 1,000 mg by mouth in the morning and at bedtime.   albuterol (PROAIR HFA) 108 (90 Base) MCG/ACT inhaler Inhale 2 puffs into the lungs every 6 (six) hours as needed for wheezing or shortness of breath.   amiodarone (PACERONE) 200 MG tablet Take 1 tablet (200 mg total) by mouth 2 (two) times daily. From 04/21/22--start taking 1 tab (200 mg ) daily   apixaban (ELIQUIS) 5 MG TABS tablet Take 1 tablet (5 mg total) by mouth 2 (two) times daily.   ascorbic acid (VITAMIN C) 500 MG tablet Take 500 mg by mouth daily.   atorvastatin (LIPITOR) 80 MG tablet Take 1 tablet (80 mg total) by mouth daily.   baclofen (LIORESAL) 10 MG tablet Take 0.5 tablets (5 mg total) by mouth 3 (three) times daily.   busPIRone (BUSPAR) 10 MG tablet Take 2 tablets (20 mg total) by mouth 3 (three) times daily.   Calcium Citrate-Vitamin D (CALCIUM + D PO) Take 1,000 mg by mouth daily.   Cholecalciferol (VITAMIN D) 50 MCG (2000 UT) CAPS Take by mouth.    furosemide (LASIX) 20 MG tablet Take 1 tablet (20 mg total) by mouth daily.   gabapentin (NEURONTIN) 800 MG tablet Take 800 mg by mouth in the morning and at bedtime.   HYDROcodone-acetaminophen (NORCO) 10-325 MG tablet Take 1 tablet by mouth every 8 (eight) hours as needed for up to 5 days.   hydrOXYzine (ATARAX/VISTARIL) 10 MG tablet Take 1 tablet (10 mg total) by mouth 3 (three) times daily as needed.   Melatonin 10 MG TABS Take 1 tablet by mouth at bedtime.   metoprolol tartrate 37.5 MG TABS Take 37.5 mg by mouth 2 (two) times daily.   Multiple Vitamins-Minerals (MULTIVITAMIN WITH MINERALS) tablet Take 1 tablet by mouth daily.   omeprazole (PRILOSEC) 40 MG capsule Take 1  capsule (40 mg total) by mouth daily.   potassium chloride SA (KLOR-CON) 20 MEQ tablet Take 1 tablet (20 mEq total) by mouth daily.   Semaglutide, 1 MG/DOSE, 4 MG/3ML SOPN Inject 1 mg as directed once a week.   Tiotropium Bromide Monohydrate (SPIRIVA RESPIMAT) 2.5 MCG/ACT AERS INHALE 2 SPRAY(S) BY MOUTH ONCE DAILY   triamcinolone cream (KENALOG) 0.1 % Apply 1 application topically 2 (two) times daily.   vitamin B-12 (CYANOCOBALAMIN) 1000 MCG tablet Take 1,000 mcg by mouth daily.   [DISCONTINUED] DULoxetine (CYMBALTA) 30 MG capsule Start taking 30 MG by mouth daily for 7 days and then slowly reduce to every other day for 3 days, then every third day for 3 days, then every 4 day for three days until discontinued and off medication.   [DISCONTINUED] DULoxetine (CYMBALTA) 60 MG capsule Take 1 capsule (60 mg total) by mouth daily.   [DISCONTINUED] FLUoxetine (PROZAC) 20 MG capsule Take 1 capsule (20 mg total) by mouth daily. May increase to 40 MG (two tablets) by mouth daily in 2 weeks if tolerating medication   [DISCONTINUED] traMADol (ULTRAM) 50 MG tablet Take 1 tablet (50 mg total) by mouth every 8 (eight) hours as needed for moderate pain.   [DISCONTINUED] venlafaxine XR (EFFEXOR XR) 75 MG 24 hr capsule Take 1 capsule (75 mg  total) by mouth daily with breakfast.   venlafaxine XR (EFFEXOR XR) 75 MG 24 hr capsule Take 1 capsule (75 mg total) by mouth daily with breakfast.   [DISCONTINUED] loratadine (CLARITIN) 10 MG tablet Take 10 mg by mouth daily.   No facility-administered encounter medications on file as of 04/21/2022.    Diagnostic Tests Reviewed/Disposition: as on chart  Consults: cardiology  Discharge Instructions: Follow-up with PCP and cardiology.  Disease/illness Education: Reviewed with patient at [facility] Discussions/Referrals: She refuses home health, although recommended  Establishment or re-establishment of referral orders for community resources: none  Discussion with other health care providers: reviewed all recent notes  Assessment and Support of treatment regimen adherence: reviewed with patient at length  Appointments Coordinated with: reviewed with patient at length  Education for self-management, independent living, and ADLs:  reviewed with patient at length     04/21/2022    7:09 PM 03/10/2022    2:11 PM 12/11/2021   12:08 PM 12/10/2021    2:23 PM 09/03/2021    2:32 PM  Depression screen PHQ 2/9  Decreased Interest 1 0 0 0 0  Down, Depressed, Hopeless 1 0 0 0 0  PHQ - 2 Score 2 0 0 0 0  Altered sleeping 2 0 0 0 0  Tired, decreased energy 1 0 0 0 0  Change in appetite 0 0 0 0 0  Feeling bad or failure about yourself  0 0 0 0 0  Trouble concentrating 0 0 0 0 0  Moving slowly or fidgety/restless 0 0 0 0 0  Suicidal thoughts 0 0 0 0 0  PHQ-9 Score 5 0 0 0 0  Difficult doing work/chores     Not difficult at all       04/21/2022    7:09 PM 03/10/2022    2:11 PM 12/10/2021    2:23 PM 09/03/2021    2:31 PM  GAD 7 : Generalized Anxiety Score  Nervous, Anxious, on Edge 2 0 0 2  Control/stop worrying 2 0 0 0  Worry too much - different things 2 0 0 0  Trouble relaxing 2 0 0 0  Restless 2 0 0 0  Easily annoyed or irritable 3 0 0 0  Afraid - awful  might happen 2 0 0 0  Total GAD 7 Score 15 0 0 2  Anxiety Difficulty Very difficult Not difficult at all  Not difficult at all      Relevant past medical, surgical, family and social history reviewed and updated as indicated. Interim medical history since our last visit reviewed. Allergies and medications reviewed and updated.  Review of Systems  Constitutional:  Negative for activity change, appetite change, diaphoresis, fatigue and fever.  HENT:  Negative for ear discharge and ear pain.   Respiratory:  Positive for shortness of breath (with exertion on occasion). Negative for cough, chest tightness and wheezing.   Cardiovascular:  Negative for chest pain, palpitations and leg swelling.  Gastrointestinal: Negative.   Endocrine: Negative.   Neurological: Negative.   Psychiatric/Behavioral:  Negative for decreased concentration, self-injury, sleep disturbance and suicidal ideas. The patient is nervous/anxious.    Per HPI unless specifically indicated above     Objective:    BP (!) 101/59   Pulse 98   Temp 98.3 F (36.8 C) (Oral)   Ht '5\' 4"'$  (1.626 m)   Wt 238 lb (108 kg)   LMP  (LMP Unknown)   SpO2 95%   BMI 40.85 kg/m   Wt Readings from Last 3 Encounters:  04/21/22 238 lb (108 kg)  04/17/22 238 lb 14.4 oz (108.4 kg)  03/10/22 244 lb 9.6 oz (110.9 kg)    Physical Exam Vitals and nursing note reviewed.  Constitutional:      General: She is awake. She is not in acute distress.    Appearance: She is well-developed. She is obese. She is not ill-appearing.  HENT:     Head: Normocephalic.     Right Ear: Hearing, tympanic membrane, ear canal and external ear normal.     Left Ear: Hearing, tympanic membrane, ear canal and external ear normal.  Eyes:     General: Lids are normal.        Right eye: No discharge.        Left eye: No discharge.     Conjunctiva/sclera: Conjunctivae normal.     Pupils: Pupils are equal, round, and reactive to light.  Neck:     Vascular: No  carotid bruit.  Cardiovascular:     Rate and Rhythm: Normal rate. Rhythm irregularly irregular.     Heart sounds: Normal heart sounds. No murmur heard.   No gallop.  Pulmonary:     Effort: Pulmonary effort is normal. No accessory muscle usage or respiratory distress.     Breath sounds: Decreased breath sounds and wheezing present.     Comments: Diminished throughout per baseline with occasional expiratory wheezes. Abdominal:     General: Bowel sounds are normal.     Palpations: Abdomen is soft.  Musculoskeletal:     Cervical back: Normal range of motion and neck supple.     Lumbar back: Tenderness (lower back midline) present. No swelling, spasms or bony tenderness. Decreased range of motion. Negative right straight leg raise test and negative left straight leg raise test.     Right lower leg: No edema.     Left lower leg: No edema.     Comments: Walking with rollator.  Skin:    General: Skin is warm and dry.  Neurological:     Mental Status: She is alert and oriented to person, place, and time.  Psychiatric:  Attention and Perception: Attention normal.        Mood and Affect: Mood normal.        Speech: Speech normal.        Behavior: Behavior normal. Behavior is cooperative.        Thought Content: Thought content normal.   Results for orders placed or performed during the hospital encounter of 04/14/22  Comprehensive metabolic panel  Result Value Ref Range   Sodium 140 135 - 145 mmol/L   Potassium 4.9 3.5 - 5.1 mmol/L   Chloride 107 98 - 111 mmol/L   CO2 23 22 - 32 mmol/L   Glucose, Bld 115 (H) 70 - 99 mg/dL   BUN 36 (H) 8 - 23 mg/dL   Creatinine, Ser 1.37 (H) 0.44 - 1.00 mg/dL   Calcium 9.6 8.9 - 10.3 mg/dL   Total Protein 6.9 6.5 - 8.1 g/dL   Albumin 2.9 (L) 3.5 - 5.0 g/dL   AST 26 15 - 41 U/L   ALT 22 0 - 44 U/L   Alkaline Phosphatase 151 (H) 38 - 126 U/L   Total Bilirubin 0.7 0.3 - 1.2 mg/dL   GFR, Estimated 43 (L) >60 mL/min   Anion gap 10 5 - 15  CBC  with Differential  Result Value Ref Range   WBC 13.9 (H) 4.0 - 10.5 K/uL   RBC 4.38 3.87 - 5.11 MIL/uL   Hemoglobin 12.6 12.0 - 15.0 g/dL   HCT 39.7 36.0 - 46.0 %   MCV 90.6 80.0 - 100.0 fL   MCH 28.8 26.0 - 34.0 pg   MCHC 31.7 30.0 - 36.0 g/dL   RDW 15.1 11.5 - 15.5 %   Platelets 360 150 - 400 K/uL   nRBC 0.0 0.0 - 0.2 %   Neutrophils Relative % 64 %   Neutro Abs 9.0 (H) 1.7 - 7.7 K/uL   Lymphocytes Relative 24 %   Lymphs Abs 3.3 0.7 - 4.0 K/uL   Monocytes Relative 9 %   Monocytes Absolute 1.2 (H) 0.1 - 1.0 K/uL   Eosinophils Relative 2 %   Eosinophils Absolute 0.3 0.0 - 0.5 K/uL   Basophils Relative 1 %   Basophils Absolute 0.1 0.0 - 0.1 K/uL   Immature Granulocytes 0 %   Abs Immature Granulocytes 0.05 0.00 - 0.07 K/uL  Basic metabolic panel  Result Value Ref Range   Sodium 137 135 - 145 mmol/L   Potassium 4.2 3.5 - 5.1 mmol/L   Chloride 106 98 - 111 mmol/L   CO2 22 22 - 32 mmol/L   Glucose, Bld 95 70 - 99 mg/dL   BUN 28 (H) 8 - 23 mg/dL   Creatinine, Ser 1.19 (H) 0.44 - 1.00 mg/dL   Calcium 9.1 8.9 - 10.3 mg/dL   GFR, Estimated 50 (L) >60 mL/min   Anion gap 9 5 - 15  Lipid panel  Result Value Ref Range   Cholesterol 123 0 - 200 mg/dL   Triglycerides 148 <150 mg/dL   HDL 29 (L) >40 mg/dL   Total CHOL/HDL Ratio 4.2 RATIO   VLDL 30 0 - 40 mg/dL   LDL Cholesterol 64 0 - 99 mg/dL  CBC  Result Value Ref Range   WBC 15.3 (H) 4.0 - 10.5 K/uL   RBC 4.33 3.87 - 5.11 MIL/uL   Hemoglobin 12.5 12.0 - 15.0 g/dL   HCT 38.7 36.0 - 46.0 %   MCV 89.4 80.0 - 100.0 fL   MCH 28.9 26.0 - 34.0 pg  MCHC 32.3 30.0 - 36.0 g/dL   RDW 15.1 11.5 - 15.5 %   Platelets 352 150 - 400 K/uL   nRBC 0.0 0.0 - 0.2 %  T4, free  Result Value Ref Range   Free T4 0.95 0.61 - 1.12 ng/dL  HIV Antibody (routine testing w rflx)  Result Value Ref Range   HIV Screen 4th Generation wRfx Non Reactive Non Reactive  Magnesium  Result Value Ref Range   Magnesium 2.1 1.7 - 2.4 mg/dL  TSH  Result  Value Ref Range   TSH 1.952 0.350 - 4.500 uIU/mL  Urinalysis, Routine w reflex microscopic  Result Value Ref Range   Color, Urine YELLOW (A) YELLOW   APPearance HAZY (A) CLEAR   Specific Gravity, Urine 1.019 1.005 - 1.030   pH 5.0 5.0 - 8.0   Glucose, UA NEGATIVE NEGATIVE mg/dL   Hgb urine dipstick NEGATIVE NEGATIVE   Bilirubin Urine NEGATIVE NEGATIVE   Ketones, ur NEGATIVE NEGATIVE mg/dL   Protein, ur NEGATIVE NEGATIVE mg/dL   Nitrite NEGATIVE NEGATIVE   Leukocytes,Ua LARGE (A) NEGATIVE   RBC / HPF 0-5 0 - 5 RBC/hpf   WBC, UA 11-20 0 - 5 WBC/hpf   Bacteria, UA RARE (A) NONE SEEN   Squamous Epithelial / LPF 0-5 0 - 5   Hyaline Casts, UA PRESENT   Glucose, capillary  Result Value Ref Range   Glucose-Capillary 97 70 - 99 mg/dL  Magnesium  Result Value Ref Range   Magnesium 2.1 1.7 - 2.4 mg/dL  ECHOCARDIOGRAM COMPLETE  Result Value Ref Range   Weight 3,851.88 oz   Height 64 in   BP 134/76 mmHg   Ao pk vel 1.28 m/s   AV Area VTI 2.25 cm2   AR max vel 3.19 cm2   AV Mean grad 4.0 mmHg   AV Peak grad 6.6 mmHg   S' Lateral 3.79 cm   AV Area mean vel 2.64 cm2   Area-P 1/2 9.18 cm2   MV VTI 2.71 cm2  Type and screen [place]  Result Value Ref Range   ABO/RH(D) O POS    Antibody Screen NEG    Sample Expiration      04/17/2022,2359 Performed at [facility], [address] [place], [place] [address]       Assessment & Plan:   Problem List Items Addressed This Visit       Cardiovascular and Mediastinum   Atrial fibrillation with RVR ([place]) - Primary    Diagnosed 04/14/22 in hospital.  At this time HR stable and minimal symptoms, some SBE present.  Continue current medication regimen and adjust as needed.  Has upcoming visit with cardiology, will review notes and labs once available.  CBC, CMP, and TSH today.       Benign hypertension    Chronic, stable.  BP well below goal today.  Continue current medication regimen and adjust as  needed -- is taking BB only at this time, will keep off HCTZ and Telmisartan.  Recommend she monitor BP at home daily and document for provider + focus on DASH diet.  Obtain CMP and CBC + TSH today.  Refills up to date.           Respiratory   Centrilobular emphysema (HCC)    Chronic, ongoing.  Continue current medication regimen and adjust as needed.  Not O2 dependent at this time.  Continue to recommend complete smoking cessation. Spirometry at next visit.  Consider referral to pulmonary  if worsening.  May benefit from change to Trelegy or Breztri in future.       Sleep apnea    Chronic, ongoing -- continue 100% use of this.  Received new equipment recently.         Immune and Lymphatic   Abdominal lymphadenopathy    Will recheck CBC today due to lymphadenopathy noted on CT abdomen in hospital.  Will order PET CT as recommended to further assess this.  Discussed with patient and will have return to hematology if any further abnormalities on imaging, was followed by them in past for elevation in WBC.      Relevant Orders   CBC with Differential/Platelet   Comprehensive metabolic panel   TSH   NM PET Image Initial (PI) Skull Base To Thigh (F-18 FDG)     Genitourinary   Chronic kidney disease, stage 3a (HCC)    Ongoing.  GFR in the 50's recent visits, recent was 5 in hospital.  Recheck today.  Reminded to avoid NSAIDs.  May need to adjust Gabapentin based on labs today, will review and determine need to reduce -- discussed with patient.  Labs today: CMP and CBC.  Increase fluid intake at home.         Other   Anxiety    Refer to depression plan of care.       Relevant Medications   venlafaxine XR (EFFEXOR XR) 75 MG 24 hr capsule   Chronic pain    Chronic, ongoing with poor control to lumbar spine.  She agrees to referral to pain clinic today, wishes not to go to previous clinic which was noted in referral.  Will send in short burst of Norco to use as needed for pain and  recommend she continue Gabapentin and Tylenol + Baclofen.  She refuses home health for PT.  Is aware PCP does not perform chronic pain management.  Continue to work on modest weight loss.       Relevant Medications   HYDROcodone-acetaminophen (NORCO) 10-325 MG tablet   venlafaxine XR (EFFEXOR XR) 75 MG 24 hr capsule   Other Relevant Orders   Ambulatory referral to Pain Clinic   Depression, major, single episode, in partial remission (HCC)    Chronic, ongoing. Denies SI/HI.  Having increased panic and anxiety at this time, discussed at length today with her.  Need to avoid benzo due to chronic pain and current opioid use, suspect this may need to be continued by pain management.  She is aware of this and reason benzo not provided.  Considered Prozac, as worked well in past, but this can interact with Amiodarone (QT prolongation) - will not initiate this.  Stop Duloxetine and start Effexor XR 75 MG, which may benefit panic disorders.  Continue Bupsar and Vistaril as needed.  Consider psychiatry referral in future.  Return in 4 weeks.       Relevant Medications   venlafaxine XR (EFFEXOR XR) 75 MG 24 hr capsule   Facet syndrome, lumbar    Chronic, ongoing with poor control.  She agrees to referral to pain clinic today, wishes not to go to previous clinic which was noted in referral.  Will send in short burst of Norco to use as needed for pain and recommend she continue Gabapentin and Tylenol + Baclofen.  She refuses home health for PT.  Is aware PCP does not perform chronic pain management.       Relevant Medications   HYDROcodone-acetaminophen (NORCO) 10-325 MG tablet  Other Relevant Orders   Ambulatory referral to Pain Clinic   Hyperlipidemia    Chronic, ongoing.  Continue statin and adjust as needed -- Atorvastatin 80 MG.  Labs up to date.       Monocytosis    Was followed by oncology, now to return as needed if any consistent elevations noted >20.  To check CBC Q6MOS -- will recheck  today due to lymphadenopathy noted on CT abdomen in hospital.  Will order PET CT as recommended to further assess this.       Relevant Orders   CBC with Differential/Platelet   Comprehensive metabolic panel   TSH   NM PET Image Initial (PI) Skull Base To Thigh (F-18 FDG)   Morbid obesity (HCC)    BMI 40.85 with HTN and COPD.  Recommended eating smaller high protein, low fat meals more frequently and exercising 30 mins a day 5 times a week with a goal of 10-15lb weight loss in the next 3 months. Patient voiced their understanding and motivation to adhere to these recommendations.  Tolerating Ozempic with total 21 pounds loss thus far -- will continue 1 MG weekly.  Educated her on this.  No history of thyroid cancer in patient or her family.  Return in 3 months for weight check.        Nicotine dependence, cigarettes, uncomplicated    I have recommended complete cessation of tobacco use. I have discussed various options available for assistance with tobacco cessation including over the counter methods (Nicotine gum, patch and lozenges). We also discussed prescription options (Chantix, Nicotine Inhaler / Nasal Spray). The patient is not interested in pursuing any prescription tobacco cessation options at this time.  Continue annual lung screening.        Spinal stenosis, lumbar region, with neurogenic claudication    Chronic, ongoing with poor control.  She agrees to referral to pain clinic today, wishes not to go to previous clinic which was noted in referral.  Will send in short burst of Norco to use as needed for pain and recommend she continue Gabapentin and Tylenol + Baclofen.  Stop Tramadol due to risk for decreased seizure threshold & interaction with other medications.  She refuses home health for PT.  Is aware PCP does not perform chronic pain management.       Relevant Medications   HYDROcodone-acetaminophen (NORCO) 10-325 MG tablet   venlafaxine XR (EFFEXOR XR) 75 MG 24 hr capsule    Other Relevant Orders   Ambulatory referral to Pain Clinic     Follow up plan: Return in about 4 weeks (around 05/19/2022) for A-FIB AND MOOD + PAIN.

## 2022-04-21 NOTE — Assessment & Plan Note (Signed)
Chronic, ongoing. Denies SI/HI.  Having increased panic and anxiety at this time, discussed at length today with her.  Need to avoid benzo due to chronic pain and current opioid use, suspect this may need to be continued by pain management.  She is aware of this and reason benzo not provided.  Considered Prozac, as worked well in past, but this can interact with Amiodarone (QT prolongation) - will not initiate this.  Stop Duloxetine and start Effexor XR 75 MG, which may benefit panic disorders.  Continue Bupsar and Vistaril as needed.  Consider psychiatry referral in future.  Return in 4 weeks.

## 2022-04-21 NOTE — Assessment & Plan Note (Signed)
Chronic, ongoing with poor control to lumbar spine.  She agrees to referral to pain clinic today, wishes not to go to previous clinic which was noted in referral.  Will send in short burst of Norco to use as needed for pain and recommend she continue Gabapentin and Tylenol + Baclofen.  She refuses home health for PT.  Is aware PCP does not perform chronic pain management.  Continue to work on modest weight loss.

## 2022-04-21 NOTE — Assessment & Plan Note (Addendum)
Chronic, ongoing with poor control.  She agrees to referral to pain clinic today, wishes not to go to previous clinic which was noted in referral.  Will send in short burst of Norco to use as needed for pain and recommend she continue Gabapentin and Tylenol + Baclofen.  Stop Tramadol due to risk for decreased seizure threshold & interaction with other medications.  She refuses home health for PT.  Is aware PCP does not perform chronic pain management.

## 2022-04-21 NOTE — Assessment & Plan Note (Signed)
Chronic, ongoing.  Continue current medication regimen and adjust as needed.  Not O2 dependent at this time.  Continue to recommend complete smoking cessation. Spirometry at next visit.  Consider referral to pulmonary if worsening.  May benefit from change to Trelegy or Breztri in future. 

## 2022-04-21 NOTE — Assessment & Plan Note (Signed)
Will recheck CBC today due to lymphadenopathy noted on CT abdomen in hospital.  Will order PET CT as recommended to further assess this.  Discussed with patient and will have return to hematology if any further abnormalities on imaging, was followed by them in past for elevation in WBC.

## 2022-04-21 NOTE — Assessment & Plan Note (Signed)
I have recommended complete cessation of tobacco use. I have discussed various options available for assistance with tobacco cessation including over the counter methods (Nicotine gum, patch and lozenges). We also discussed prescription options (Chantix, Nicotine Inhaler / Nasal Spray). The patient is not interested in pursuing any prescription tobacco cessation options at this time.  Continue annual lung screening. 

## 2022-04-21 NOTE — Assessment & Plan Note (Signed)
Was followed by oncology, now to return as needed if any consistent elevations noted >20.  To check CBC Q6MOS -- will recheck today due to lymphadenopathy noted on CT abdomen in hospital.  Will order PET CT as recommended to further assess this.

## 2022-04-21 NOTE — Assessment & Plan Note (Signed)
Chronic, stable.  BP well below goal today.  Continue current medication regimen and adjust as needed -- is taking BB only at this time, will keep off HCTZ and Telmisartan.  Recommend she monitor BP at home daily and document for provider + focus on DASH diet.  Obtain CMP and CBC + TSH today.  Refills up to date.

## 2022-04-21 NOTE — Assessment & Plan Note (Signed)
BMI 40.85 with HTN and COPD.  Recommended eating smaller high protein, low fat meals more frequently and exercising 30 mins a day 5 times a week with a goal of 10-15lb weight loss in the next 3 months. Patient voiced their understanding and motivation to adhere to these recommendations.  Tolerating Ozempic with total 21 pounds loss thus far -- will continue 1 MG weekly.  Educated her on this.  No history of thyroid cancer in patient or her family.  Return in 3 months for weight check.

## 2022-04-21 NOTE — Assessment & Plan Note (Signed)
Chronic, ongoing with poor control.  She agrees to referral to pain clinic today, wishes not to go to previous clinic which was noted in referral.  Will send in short burst of Norco to use as needed for pain and recommend she continue Gabapentin and Tylenol + Baclofen.  She refuses home health for PT.  Is aware PCP does not perform chronic pain management.

## 2022-04-21 NOTE — Assessment & Plan Note (Signed)
Chronic, ongoing.  Continue statin and adjust as needed -- Atorvastatin 80 MG.  Labs up to date.

## 2022-04-21 NOTE — Assessment & Plan Note (Addendum)
Diagnosed 04/14/22 in hospital.  At this time HR stable and minimal symptoms, some SBE present.  Continue current medication regimen and adjust as needed.  Has upcoming visit with cardiology, will review notes and labs once available.  CBC, CMP, and TSH today.

## 2022-04-22 ENCOUNTER — Telehealth: Payer: Self-pay

## 2022-04-22 LAB — COMPREHENSIVE METABOLIC PANEL
ALT: 17 IU/L (ref 0–32)
AST: 27 IU/L (ref 0–40)
Albumin/Globulin Ratio: 1.2 (ref 1.2–2.2)
Albumin: 3.6 g/dL — ABNORMAL LOW (ref 3.8–4.8)
Alkaline Phosphatase: 172 IU/L — ABNORMAL HIGH (ref 44–121)
BUN/Creatinine Ratio: 13 (ref 12–28)
BUN: 15 mg/dL (ref 8–27)
Bilirubin Total: 0.2 mg/dL (ref 0.0–1.2)
CO2: 21 mmol/L (ref 20–29)
Calcium: 9.7 mg/dL (ref 8.7–10.3)
Chloride: 105 mmol/L (ref 96–106)
Creatinine, Ser: 1.18 mg/dL — ABNORMAL HIGH (ref 0.57–1.00)
Globulin, Total: 3 g/dL (ref 1.5–4.5)
Glucose: 78 mg/dL (ref 70–99)
Potassium: 4.7 mmol/L (ref 3.5–5.2)
Sodium: 139 mmol/L (ref 134–144)
Total Protein: 6.6 g/dL (ref 6.0–8.5)
eGFR: 51 mL/min/{1.73_m2} — ABNORMAL LOW (ref >59)

## 2022-04-22 LAB — CBC WITH DIFFERENTIAL/PLATELET
Basophils Absolute: 0.1 10*3/uL (ref 0.0–0.2)
Basos: 1 %
EOS (ABSOLUTE): 0.2 10*3/uL (ref 0.0–0.4)
Eos: 1 %
Hematocrit: 37.1 % (ref 34.0–46.6)
Hemoglobin: 12.6 g/dL (ref 11.1–15.9)
Immature Grans (Abs): 0.1 10*3/uL (ref 0.0–0.1)
Immature Granulocytes: 1 %
Lymphocytes Absolute: 4.5 10*3/uL — ABNORMAL HIGH (ref 0.7–3.1)
Lymphs: 31 %
MCH: 30.1 pg (ref 26.6–33.0)
MCHC: 34 g/dL (ref 31.5–35.7)
MCV: 89 fL (ref 79–97)
Monocytes Absolute: 1.2 10*3/uL — ABNORMAL HIGH (ref 0.1–0.9)
Monocytes: 8 %
Neutrophils Absolute: 8.6 10*3/uL — ABNORMAL HIGH (ref 1.4–7.0)
Neutrophils: 58 %
Platelets: 383 10*3/uL (ref 150–450)
RBC: 4.19 x10E6/uL (ref 3.77–5.28)
RDW: 14 % (ref 11.7–15.4)
WBC: 14.7 10*3/uL — ABNORMAL HIGH (ref 3.4–10.8)

## 2022-04-22 LAB — TSH: TSH: 3.34 u[IU]/mL (ref 0.450–4.500)

## 2022-04-22 NOTE — Telephone Encounter (Signed)
-----   Message from Venita Lick, NP sent at 04/21/2022  7:30 PM EDT ----- Please call in morning to ensure she receive MyChart message I sent this is it, it is in chart if she did not -- if she did not let her know what it says.  Thank you.

## 2022-04-22 NOTE — Progress Notes (Signed)
Contacted via [place] -- ordered PET CT for patient, can we see if we can get this as soon as possible to further assess   Good evening Velinda, I see you got my lengthy message last night.  Thank you for reading it all:) Your labs have returned: - Kidney function, creatinine and eGFR, continues to show mild kidney disease with no worsening.  We will continue to monitor and adjust medications as needed.  Liver function, AST and ALT, is normal. - CBC shows ongoing mild elevation in white blood cell count and lymphocytes and neutrophils.  I have ordered the PET scan as was recommended to further assess the abdominal lymph node that was abnormal on recent imaging in hospital.  They should call over next week to schedule this, let me know if they do not.  We will see what this shows and I may have you call to get back in hematology if this is abnormal. - Thyroid lab, TSH, is normal.  Any questions? Keep being wonderful!!  Thank you for allowing me to participate in your care.  I appreciate you. Kindest regards, Devita Nies

## 2022-04-22 NOTE — Telephone Encounter (Signed)
Left message for patient in regards to clarify if patient received the MyChart message sent by the provider yesterday after patient's appointment. Advised patient to give our office a call back if she has any questions or concerns.   OK for [facility] to clarify if patient calls back.

## 2022-04-27 ENCOUNTER — Encounter: Payer: Self-pay | Admitting: Nurse Practitioner

## 2022-04-28 MED ORDER — ALPRAZOLAM 0.5 MG PO TABS: ORAL_TABLET | ORAL | 0 refills | Status: DC

## 2022-04-30 ENCOUNTER — Encounter (HOSPITAL_COMMUNITY)
Admission: RE | Admit: 2022-04-30 | Discharge: 2022-04-30 | Disposition: A | Discharge status: Home / Self Care | Payer: PPO | Source: Ambulatory Visit | Attending: Nurse Practitioner | Admitting: Nurse Practitioner

## 2022-04-30 DIAGNOSIS — R935 Abnormal findings on diagnostic imaging of other abdominal regions, including retroperitoneum: Secondary | ICD-10-CM | POA: Insufficient documentation

## 2022-04-30 DIAGNOSIS — K8020 Calculus of gallbladder without cholecystitis without obstruction: Secondary | ICD-10-CM | POA: Diagnosis not present

## 2022-04-30 DIAGNOSIS — D72821 Monocytosis (symptomatic): Secondary | ICD-10-CM | POA: Insufficient documentation

## 2022-04-30 DIAGNOSIS — I700 Atherosclerosis of aorta: Secondary | ICD-10-CM | POA: Diagnosis not present

## 2022-04-30 DIAGNOSIS — R590 Localized enlarged lymph nodes: Secondary | ICD-10-CM | POA: Diagnosis not present

## 2022-04-30 DIAGNOSIS — K429 Umbilical hernia without obstruction or gangrene: Secondary | ICD-10-CM | POA: Diagnosis not present

## 2022-04-30 LAB — GLUCOSE, CAPILLARY: Glucose-Capillary: 88 mg/dL (ref 70–99)

## 2022-04-30 MED ORDER — FLUDEOXYGLUCOSE F - 18 (FDG) INJECTION
11.5700 | Freq: Once | INTRAVENOUS | Status: AC | PRN
Start: 2022-04-30 — End: 2022-04-30
  Administered 2022-04-30: 11.57 via INTRAVENOUS

## 2022-05-01 NOTE — Progress Notes (Signed)
Contacted via [place] -- however please ensure patient is aware of results with a phone call to her: Good morning [name], your imaging has returned and I have good news.  The slightly enlarged abdominal lymph nodes are suspected to be reactive, often from a recent infection (yours may be from recent stressors with hospitalization) -- they become inflamed.  They do not appear cancerous.  They have recommended we repeat with a CT of abdomen and pelvis in 3-6 months, I will set reminder in chart to order this around September/October.  Any questions? Keep being stellar!!  Thank you for allowing me to participate in your care.  I appreciate you. Kindest regards, Talley Kreiser

## 2022-05-14 DIAGNOSIS — J449 Chronic obstructive pulmonary disease, unspecified: Secondary | ICD-10-CM | POA: Diagnosis not present

## 2022-05-14 DIAGNOSIS — Z720 Tobacco use: Secondary | ICD-10-CM | POA: Diagnosis not present

## 2022-05-14 DIAGNOSIS — G4730 Sleep apnea, unspecified: Secondary | ICD-10-CM | POA: Diagnosis not present

## 2022-05-14 DIAGNOSIS — I10 Essential (primary) hypertension: Secondary | ICD-10-CM | POA: Diagnosis not present

## 2022-05-14 DIAGNOSIS — R0602 Shortness of breath: Secondary | ICD-10-CM | POA: Diagnosis not present

## 2022-05-14 DIAGNOSIS — E782 Mixed hyperlipidemia: Secondary | ICD-10-CM | POA: Diagnosis not present

## 2022-05-14 DIAGNOSIS — I4891 Unspecified atrial fibrillation: Secondary | ICD-10-CM | POA: Diagnosis not present

## 2022-05-14 DIAGNOSIS — N1831 Chronic kidney disease, stage 3a: Secondary | ICD-10-CM | POA: Diagnosis not present

## 2022-05-14 DIAGNOSIS — R0789 Other chest pain: Secondary | ICD-10-CM | POA: Diagnosis not present

## 2022-05-19 ENCOUNTER — Other Ambulatory Visit: Payer: PPO

## 2022-05-19 ENCOUNTER — Ambulatory Visit: Payer: PPO | Admitting: Oncology

## 2022-05-19 ENCOUNTER — Other Ambulatory Visit: Payer: Self-pay | Admitting: Nurse Practitioner

## 2022-05-20 NOTE — Telephone Encounter (Signed)
Requested medication (s) are due for refill today: no  Requested medication (s) are on the active medication list: yes  Last refill:  04/28/22 #5 0 refills  Future visit scheduled: yes in 5 days   Notes to clinic:  not delegated per protocol do you want to refill Rx?      Requested Prescriptions  Pending Prescriptions Disp Refills   ALPRAZolam (XANAX) 0.5 MG tablet [Pharmacy Med Name: ALPRAZolam 0.5 MG Oral Tablet] 5 tablet 0    Sig: TAKE 2 TABLETS BY MOUTH 30 TO 60 MINUTES PRIOR TO IMAGING - MAY REPEAT DOSE WITH 1 TABLET 30 MINUTES AFTER TAKING INITIAL DOSE IF NEEDED     Not Delegated - Psychiatry: Anxiolytics/Hypnotics 2 Failed - 05/19/2022  5:05 PM      Failed - This refill cannot be delegated      Failed - Urine Drug Screen completed in last 360 days      Passed - Patient is not pregnant      Passed - Valid encounter within last 6 months    Recent Outpatient Visits           4 weeks ago Atrial fibrillation with RVR ([place])   [place], Jolene T, NP   2 months ago Morbid obesity ([place])   [place] Cannady, [place] T, NP   5 months ago Morbid obesity ([place])   [place] Cannady, Henrine Screws T, NP   7 months ago Morbid obesity ([place])   [place] Cannady, Jolene T, NP   8 months ago Centrilobular emphysema ([place])   [place], Barbaraann Faster, NP       Future Appointments             In 5 days Cannady, Barbaraann Faster, NP [facility], [place]   In 2 weeks [place], Barbaraann Faster, NP [facility], [facility]

## 2022-05-25 ENCOUNTER — Ambulatory Visit (INDEPENDENT_AMBULATORY_CARE_PROVIDER_SITE_OTHER): Payer: PPO | Admitting: Nurse Practitioner

## 2022-05-25 ENCOUNTER — Encounter: Payer: Self-pay | Admitting: Nurse Practitioner

## 2022-05-25 DIAGNOSIS — G894 Chronic pain syndrome: Secondary | ICD-10-CM

## 2022-05-25 DIAGNOSIS — I4891 Unspecified atrial fibrillation: Secondary | ICD-10-CM | POA: Diagnosis not present

## 2022-05-25 DIAGNOSIS — F419 Anxiety disorder, unspecified: Secondary | ICD-10-CM | POA: Diagnosis not present

## 2022-05-25 DIAGNOSIS — F324 Major depressive disorder, single episode, in partial remission: Secondary | ICD-10-CM | POA: Diagnosis not present

## 2022-05-25 DIAGNOSIS — F17210 Nicotine dependence, cigarettes, uncomplicated: Secondary | ICD-10-CM | POA: Diagnosis not present

## 2022-05-25 MED ORDER — VENLAFAXINE HCL ER 150 MG PO CP24: 150.0000 mg | ORAL_CAPSULE | Freq: Every day | ORAL | 4 refills | Status: DC

## 2022-05-25 NOTE — Assessment & Plan Note (Signed)
BMI 39.38 with HTN and COPD.  Recommended eating smaller high protein, low fat meals more frequently and exercising 30 mins a day 5 times a week with a goal of 10-15lb weight loss in the next 3 months. Patient voiced their understanding and motivation to adhere to these recommendations.  Tolerating Ozempic with total 30 pounds loss thus far since starting -- will continue 1 MG weekly.  Educated her on this.  No history of thyroid cancer in patient or her family.  Return in 3 months for weight check.

## 2022-05-25 NOTE — Progress Notes (Signed)
BP 111/67   Pulse 72   Temp 98 F (36.7 C) (Oral)   Ht 5\' 4"  (1.626 m)   Wt 229 lb 6.4 oz (104.1 kg)   LMP  (LMP Unknown)   SpO2 92%   BMI 39.38 kg/m    Subjective:    Patient ID: [name], female    DOB: 15-Nov-1956, 66 y.o.   MRN: [id number]  HPI: [name] is a 66 y.o. female  Chief Complaint  Patient presents with   Atrial Fibrillation   Mood   Pain   Medication Refill    Patient is requesting a refill on her Metoprolol prescription.    Continues on Ozempic for weight loss and has lost another 9 pounds since recent visit.  Continues to feel reduced appetite with this.    ATRIAL FIBRILLATION Saw cardiology on 05/14/22 to continue Amiodarone and Eliquis.  They plan on obtaining Myoview.  She was prescribed Metoprolol in hospital, but on review cardiology note plan shows "consider BB or Cardizem" and not on note med list.  Going for stress chest July 6th.  Continues to smoke has decreased to < 1 PPD.  Atrial fibrillation status: stable Satisfied with current treatment: yes  Medication side effects:  no Medication compliance: good compliance Etiology of atrial fibrillation:  Palpitations:  no Chest pain:   occasional  getting testing with cardiology Dyspnea on exertion:  improved Orthopnea:  no Syncope:  no Edema:   occasional Ventricular rate control:  Amiodarone Anti-coagulation: long acting   DEPRESSION Started Effexor 75 MG last visit and stopped Duloxetine.  Continues on Buspar 20 MG TID and Vistaril 10 MG TID PRN.    Has referral to pain clinic last visit, but has not heard from them as of yet.   Mood status: stable Satisfied with current treatment?: yes Symptom severity: moderate  Duration of current treatment : chronic Side effects: no Medication compliance: good compliance Psychotherapy/counseling: none Depressed mood: no Anxious mood: yes Anhedonia: no Significant weight loss or gain: no Insomnia: none -- uses CPAP 100% of the  time Fatigue: no Feelings of worthlessness or guilt: no Impaired concentration/indecisiveness: no Suicidal ideations: no Hopelessness: no Crying spells: no    05/25/2022    3:11 PM 04/21/2022    7:09 PM 03/10/2022    2:11 PM 12/11/2021   12:08 PM 12/10/2021    2:23 PM  Depression screen PHQ 2/9  Decreased Interest 0 1 0 0 0  Down, Depressed, Hopeless 0 1 0 0 0  PHQ - 2 Score 0 2 0 0 0  Altered sleeping 0 2 0 0 0  Tired, decreased energy 0 1 0 0 0  Change in appetite 0 0 0 0 0  Feeling bad or failure about yourself  0 0 0 0 0  Trouble concentrating 0 0 0 0 0  Moving slowly or fidgety/restless 0 0 0 0 0  Suicidal thoughts 0 0 0 0 0  PHQ-9 Score 0 5 0 0 0  Difficult doing work/chores Not difficult at all           05/25/2022    3:10 PM 04/21/2022    7:09 PM 03/10/2022    2:11 PM 12/10/2021    2:23 PM  GAD 7 : Generalized Anxiety Score  Nervous, Anxious, on Edge 3 2 0 0  Control/stop worrying 0 2 0 0  Worry too much - different things 0 2 0 0  Trouble relaxing 0 2 0 0  Restless 0  2 0 0  Easily annoyed or irritable 0 3 0 0  Afraid - awful might happen 0 2 0 0  Total GAD 7 Score 3 15 0 0  Anxiety Difficulty Not difficult at all Very difficult Not difficult at all     Relevant past medical, surgical, family and social history reviewed and updated as indicated. Interim medical history since our last visit reviewed. Allergies and medications reviewed and updated.  Review of Systems  Constitutional:  Negative for activity change, appetite change, diaphoresis, fatigue and fever.  HENT:  Negative for ear discharge and ear pain.   Respiratory:  Negative for cough, chest tightness, shortness of breath and wheezing.   Cardiovascular:  Negative for chest pain, palpitations and leg swelling.  Gastrointestinal: Negative.   Endocrine: Negative.   Neurological: Negative.   Psychiatric/Behavioral:  Negative for decreased concentration, self-injury, sleep disturbance and suicidal ideas. The  patient is nervous/anxious.     Per HPI unless specifically indicated above     Objective:    BP 111/67   Pulse 72   Temp 98 F (36.7 C) (Oral)   Ht 5\' 4"  (1.626 m)   Wt 229 lb 6.4 oz (104.1 kg)   LMP  (LMP Unknown)   SpO2 92%   BMI 39.38 kg/m   Wt Readings from Last 3 Encounters:  05/25/22 229 lb 6.4 oz (104.1 kg)  04/21/22 238 lb (108 kg)  04/17/22 238 lb 14.4 oz (108.4 kg)    Physical Exam Vitals and nursing note reviewed.  Constitutional:      General: She is awake. She is not in acute distress.    Appearance: She is well-developed. She is obese. She is not ill-appearing.  HENT:     Head: Normocephalic.     Right Ear: Hearing, tympanic membrane, ear canal and external ear normal.     Left Ear: Hearing, tympanic membrane, ear canal and external ear normal.  Eyes:     General: Lids are normal.        Right eye: No discharge.        Left eye: No discharge.     Conjunctiva/sclera: Conjunctivae normal.     Pupils: Pupils are equal, round, and reactive to light.  Neck:     Vascular: No carotid bruit.  Cardiovascular:     Rate and Rhythm: Normal rate and regular rhythm.     Heart sounds: Normal heart sounds. No murmur heard.    No gallop.  Pulmonary:     Effort: Pulmonary effort is normal. No accessory muscle usage or respiratory distress.     Breath sounds: Decreased breath sounds and wheezing present.     Comments: Diminished throughout per baseline with occasional expiratory wheezes. Abdominal:     General: Bowel sounds are normal.     Palpations: Abdomen is soft.  Musculoskeletal:     Cervical back: Normal range of motion and neck supple.     Right lower leg: No edema.     Left lower leg: No edema.  Skin:    General: Skin is warm and dry.  Neurological:     Mental Status: She is alert and oriented to person, place, and time.  Psychiatric:        Attention and Perception: Attention normal.        Mood and Affect: Mood normal.        Speech: Speech normal.         Behavior: Behavior normal. Behavior is cooperative.  Thought Content: Thought content normal.    Results for orders placed or performed during the hospital encounter of 04/30/22  Glucose, capillary  Result Value Ref Range   Glucose-Capillary 88 70 - 99 mg/dL      Assessment & Plan:   Problem List Items Addressed This Visit       Cardiovascular and Mediastinum   Atrial fibrillation with RVR (HCC)    Diagnosed 04/14/22 in hospital.  At this time HR stable and minimal symptoms.  Continue collaboration with cardiology and current medication regimen, adjust as needed.  Monitor thyroid closely with Amiodarone.  Recommend she reach out to cardiology to inquire about Metoprolol and whether they wish to continue this, not on their recent note.      Relevant Medications   amiodarone (PACERONE) 200 MG tablet     Other   Anxiety    Refer to depression plan of care.      Relevant Medications   venlafaxine XR (EFFEXOR-XR) 150 MG 24 hr capsule   Chronic pain    Chronic, ongoing with poor control to lumbar spine.  Check on pain clinic referral.  She refuses home health for PT.  Is aware PCP does not perform chronic pain management.  Continue to work on modest weight loss.      Relevant Medications   venlafaxine XR (EFFEXOR-XR) 150 MG 24 hr capsule   Depression, major, single episode, in partial remission (HCC)    Chronic, ongoing. Denies SI/HI.  Symptoms improving with Effexor, but not 100% -- increase Effexor XL to 150 MG daily. Need to avoid benzo due to chronic pain and possible opioid use in future with pain management + her underlying COPD.  She is aware of this and reason benzo not provided.  Considered Prozac, as worked well in past, but this can interact with Amiodarone (QT prolongation) - will not initiate this at this time.  Consider psychiatry referral in future.  Return in 3 months.      Relevant Medications   venlafaxine XR (EFFEXOR-XR) 150 MG 24 hr capsule    Morbid obesity (HCC)    BMI 39.38 with HTN and COPD.  Recommended eating smaller high protein, low fat meals more frequently and exercising 30 mins a day 5 times a week with a goal of 10-15lb weight loss in the next 3 months. Patient voiced their understanding and motivation to adhere to these recommendations.  Tolerating Ozempic with total 30 pounds loss thus far since starting -- will continue 1 MG weekly.  Educated her on this.  No history of thyroid cancer in patient or her family.  Return in 3 months for weight check.       Nicotine dependence, cigarettes, uncomplicated    I have recommended complete cessation of tobacco use. I have discussed various options available for assistance with tobacco cessation including over the counter methods (Nicotine gum, patch and lozenges). We also discussed prescription options (Chantix, Nicotine Inhaler / Nasal Spray). The patient is not interested in pursuing any prescription tobacco cessation options at this time.  Continue annual lung screening.         Follow up plan: Return in about 6 weeks (around 07/06/2022) for MOOD AND PAIN + WEIGHT CHECK AND A-FIB.

## 2022-05-25 NOTE — Assessment & Plan Note (Signed)
I have recommended complete cessation of tobacco use. I have discussed various options available for assistance with tobacco cessation including over the counter methods (Nicotine gum, patch and lozenges). We also discussed prescription options (Chantix, Nicotine Inhaler / Nasal Spray). The patient is not interested in pursuing any prescription tobacco cessation options at this time.  Continue annual lung screening. 

## 2022-06-04 DIAGNOSIS — R0602 Shortness of breath: Secondary | ICD-10-CM | POA: Diagnosis not present

## 2022-06-04 DIAGNOSIS — I4891 Unspecified atrial fibrillation: Secondary | ICD-10-CM | POA: Diagnosis not present

## 2022-06-09 ENCOUNTER — Ambulatory Visit: Payer: PPO | Admitting: Nurse Practitioner

## 2022-07-02 ENCOUNTER — Encounter: Payer: Self-pay | Admitting: Nurse Practitioner

## 2022-07-02 DIAGNOSIS — I4891 Unspecified atrial fibrillation: Secondary | ICD-10-CM | POA: Diagnosis not present

## 2022-07-02 DIAGNOSIS — Z720 Tobacco use: Secondary | ICD-10-CM | POA: Diagnosis not present

## 2022-07-02 DIAGNOSIS — R0602 Shortness of breath: Secondary | ICD-10-CM | POA: Diagnosis not present

## 2022-07-02 DIAGNOSIS — J449 Chronic obstructive pulmonary disease, unspecified: Secondary | ICD-10-CM | POA: Diagnosis not present

## 2022-07-02 DIAGNOSIS — N1831 Chronic kidney disease, stage 3a: Secondary | ICD-10-CM | POA: Diagnosis not present

## 2022-07-02 DIAGNOSIS — I10 Essential (primary) hypertension: Secondary | ICD-10-CM | POA: Diagnosis not present

## 2022-07-02 DIAGNOSIS — G4730 Sleep apnea, unspecified: Secondary | ICD-10-CM | POA: Diagnosis not present

## 2022-07-02 DIAGNOSIS — E782 Mixed hyperlipidemia: Secondary | ICD-10-CM | POA: Diagnosis not present

## 2022-07-02 DIAGNOSIS — R0789 Other chest pain: Secondary | ICD-10-CM | POA: Diagnosis not present

## 2022-07-02 MED ORDER — SPIRIVA RESPIMAT 2.5 MCG/ACT IN AERS: INHALATION_SPRAY | RESPIRATORY_TRACT | 5 refills | Status: DC

## 2022-07-05 NOTE — Patient Instructions (Signed)
Chronic Back Pain When back pain lasts longer than 3 months, it is called chronic back pain. Pain may get worse at certain times (flare-ups). There are things you can do at home to manage your pain. Follow these instructions at home: Pay attention to any changes in your symptoms. Take these actions to help with your pain: Managing pain and stiffness     If told, put ice on the painful area. Your doctor may tell you to use ice for 24-48 hours after the flare-up starts. To do this: Put ice in a plastic bag. Place a towel between your skin and the bag. Leave the ice on for 20 minutes, 2-3 times a day. If told, put heat on the painful area. Do this as often as told by your doctor. Use the heat source that your doctor recommends, such as a moist heat pack or a heating pad. Place a towel between your skin and the heat source. Leave the heat on for 20-30 minutes. Take off the heat if your skin turns bright red. This is especially important if you are unable to feel pain, heat, or cold. You may have a greater risk of getting burned. Soak in a warm bath. This can help relieve pain. Activity  Avoid bending and other activities that make pain worse. When standing: Keep your upper back and neck straight. Keep your shoulders pulled back. Avoid slouching. When sitting: Keep your back straight. Relax your shoulders. Do not round your shoulders or pull them backward. Do not sit or stand in one place for long periods of time. Take short rest breaks during the day. Lying down or standing is usually better than sitting. Resting can help relieve pain. When sitting or lying down for a long time, do some mild activity or stretching. This will help to prevent stiffness and pain. Get regular exercise. Ask your doctor what activities are safe for you. Do not lift anything that is heavier than 10 lb (4.5 kg) or the limit that you are told, until your doctor says that it is safe. To prevent injury when you lift  things: Bend your knees. Keep the weight close to your body. Avoid twisting. Sleep on a firm mattress. Try lying on your side with your knees slightly bent. If you lie on your back, put a pillow under your knees. Medicines Treatment may include medicines for pain and swelling taken by mouth or put on the skin, prescription pain medicine, or muscle relaxants. Take over-the-counter and prescription medicines only as told by your doctor. Ask your doctor if the medicine prescribed to you: Requires you to avoid driving or using machinery. Can cause trouble pooping (constipation). You may need to take these actions to prevent or treat trouble pooping: Drink enough fluid to keep your pee (urine) pale yellow. Take over-the-counter or prescription medicines. Eat foods that are high in fiber. These include beans, whole grains, and fresh fruits and vegetables. Limit foods that are high in fat and sugars. These include fried or sweet foods. General instructions Do not use any products that contain nicotine or tobacco, such as cigarettes, e-cigarettes, and chewing tobacco. If you need help quitting, ask your doctor. Keep all follow-up visits as told by your doctor. This is important. Contact a doctor if: Your pain does not get better with rest or medicine. Your pain gets worse, or you have new pain. You have a high fever. You lose weight very quickly. You have trouble doing your normal activities. Get help right away   if: One or both of your legs or feet feel weak. One or both of your legs or feet lose feeling (have numbness). You have trouble controlling when you poop (have a bowel movement) or pee (urinate). You have bad back pain and: You feel like you may vomit (nauseous), or you vomit. You have pain in your belly (abdomen). You have shortness of breath. You faint. Summary When back pain lasts longer than 3 months, it is called chronic back pain. Pain may get worse at certain times  (flare-ups). Use ice and heat as told by your doctor. Your doctor may tell you to use ice after flare-ups. This information is not intended to replace advice given to you by your health care provider. Make sure you discuss any questions you have with your health care provider. Document Revised: 12/27/2019 Document Reviewed: 12/27/2019 Elsevier Patient Education  2023 [employer].  

## 2022-07-07 ENCOUNTER — Ambulatory Visit (INDEPENDENT_AMBULATORY_CARE_PROVIDER_SITE_OTHER): Payer: PPO | Admitting: Nurse Practitioner

## 2022-07-07 ENCOUNTER — Encounter: Payer: Self-pay | Admitting: Nurse Practitioner

## 2022-07-07 VITALS — BP 139/68 | HR 90 | Temp 98.2°F | Ht 64.0 in | Wt 222.6 lb

## 2022-07-07 DIAGNOSIS — G894 Chronic pain syndrome: Secondary | ICD-10-CM

## 2022-07-07 DIAGNOSIS — F324 Major depressive disorder, single episode, in partial remission: Secondary | ICD-10-CM

## 2022-07-07 DIAGNOSIS — I4891 Unspecified atrial fibrillation: Secondary | ICD-10-CM

## 2022-07-07 MED ORDER — SEMAGLUTIDE (2 MG/DOSE) 8 MG/3ML ~~LOC~~ SOPN: 2.0000 mg | PEN_INJECTOR | SUBCUTANEOUS | 5 refills | Status: DC

## 2022-07-07 NOTE — Assessment & Plan Note (Addendum)
Diagnosed 04/14/22 in hospital.  At this time HR stable and no symptoms.  Continue collaboration with cardiology and current medication regimen, adjust as needed.  Monitor thyroid closely with Amiodarone.

## 2022-07-07 NOTE — Assessment & Plan Note (Signed)
BMI 38.21 with HTN and COPD.  Recommended eating smaller high protein, low fat meals more frequently and exercising 30 mins a day 5 times a week with a goal of 10-15lb weight loss in the next 3 months. Patient voiced their understanding and motivation to adhere to these recommendations.  Tolerating Ozempic with total 34 pounds loss thus far since starting -- will increase to 2 MG weekly.  Educated her on this.  No history of thyroid cancer in patient or her family.  Return in 8 weeks for weight check.

## 2022-07-07 NOTE — Assessment & Plan Note (Signed)
Chronic, stable at this time. Denies SI/HI.  Symptoms improved with Effexor, continue Effexor XL 150 MG daily. Need to avoid benzo due to chronic pain and possible opioid use in future with pain management + her underlying COPD.  She is aware of this and reason benzo not provided.  Considered Prozac, as worked well in past, but this can interact with Amiodarone (QT prolongation) - will not initiate this at this time.  Consider psychiatry referral in future.

## 2022-07-07 NOTE — Progress Notes (Signed)
BP 139/68   Pulse 90   Temp 98.2 F (36.8 C) (Oral)   Ht '5\' 4"'$  (1.626 m)   Wt 222 lb 9.6 oz (101 kg)   LMP  (LMP Unknown)   SpO2 97%   BMI 38.21 kg/m    Subjective:    Patient ID: [name], female    DOB: 1956/04/26, 66 y.o.   MRN: [id number]  HPI: [name] is a 66 y.o. female  Chief Complaint  Patient presents with   Mood   Pain   Atrial Fibrillation   Weight Check   ATRIAL FIBRILLATION Saw cardiology 07/02/22 to continue Amiodarone and Eliquis + Metoprolol -- had good stress test.  Continues to smoke has decreased to < 1 PPD.   Continues on Ozempic for weight loss/previous A1c elevation and has lost another 7 pounds since recent visit.  Continues to feel reduced appetite with this, although near end of week starts to feel this wear off.  Started 12/10/21 at 256 lbs and now down to 222 lbs = total 34 pounds loss. Atrial fibrillation status: stable Satisfied with current treatment: yes  Medication side effects:  no Medication compliance: good compliance Etiology of atrial fibrillation:  Palpitations:  no Chest pain:  no Dyspnea on exertion: no Orthopnea:  no Syncope:  no Edema: no Ventricular rate control:  Amiodarone , BB Anti-coagulation: long acting   DEPRESSION Continues Effexor 150 MG daily which has offered benefit.  Continues on Buspar 20 MG TID and Vistaril 10 MG TID PRN.  Sees pain clinic tomorrow, Dr. Humphrey Rolls for ongoing lower back pain -- this will be initial visit. Mood status: stable Satisfied with current treatment?: yes Symptom severity: moderate  Duration of current treatment : chronic Side effects: no Medication compliance: good compliance Psychotherapy/counseling: none Depressed mood: no Anxious mood: rarely Anhedonia: no Significant weight loss or gain: no Insomnia: none -- uses CPAP 100% of the time Fatigue: no Feelings of worthlessness or guilt: no Impaired concentration/indecisiveness: no Suicidal ideations: no Hopelessness:  no Crying spells: no    07/07/2022    3:32 PM 05/25/2022    3:11 PM 04/21/2022    7:09 PM 03/10/2022    2:11 PM 12/11/2021   12:08 PM  Depression screen PHQ 2/9  Decreased Interest 0 0 1 0 0  Down, Depressed, Hopeless 0 0 1 0 0  PHQ - 2 Score 0 0 2 0 0  Altered sleeping 0 0 2 0 0  Tired, decreased energy 0 0 1 0 0  Change in appetite 0 0 0 0 0  Feeling bad or failure about yourself  0 0 0 0 0  Trouble concentrating 0 0 0 0 0  Moving slowly or fidgety/restless 0 0 0 0 0  Suicidal thoughts 0 0 0 0 0  PHQ-9 Score 0 0 5 0 0  Difficult doing work/chores Not difficult at all Not difficult at all          07/07/2022    3:32 PM 05/25/2022    3:10 PM 04/21/2022    7:09 PM 03/10/2022    2:11 PM  GAD 7 : Generalized Anxiety Score  Nervous, Anxious, on Edge '1 3 2 '$ 0  Control/stop worrying 0 0 2 0  Worry too much - different things 0 0 2 0  Trouble relaxing 0 0 2 0  Restless 0 0 2 0  Easily annoyed or irritable 0 0 3 0  Afraid - awful might happen 0 0 2 0  Total GAD 7 Score '1 3 15 '$ 0  Anxiety Difficulty Not difficult at all Not difficult at all Very difficult Not difficult at all   Relevant past medical, surgical, family and social history reviewed and updated as indicated. Interim medical history since our last visit reviewed. Allergies and medications reviewed and updated.  Review of Systems  Constitutional:  Negative for activity change, appetite change, diaphoresis, fatigue and fever.  HENT:  Negative for ear discharge and ear pain.   Respiratory:  Negative for cough, chest tightness, shortness of breath and wheezing.   Cardiovascular:  Negative for chest pain, palpitations and leg swelling.  Gastrointestinal: Negative.   Neurological: Negative.   Psychiatric/Behavioral:  Negative for decreased concentration, self-injury, sleep disturbance and suicidal ideas. The patient is not nervous/anxious.    Per HPI unless specifically indicated above     Objective:    BP 139/68   Pulse 90    Temp 98.2 F (36.8 C) (Oral)   Ht '5\' 4"'$  (1.626 m)   Wt 222 lb 9.6 oz (101 kg)   LMP  (LMP Unknown)   SpO2 97%   BMI 38.21 kg/m   Wt Readings from Last 3 Encounters:  07/07/22 222 lb 9.6 oz (101 kg)  05/25/22 229 lb 6.4 oz (104.1 kg)  04/21/22 238 lb (108 kg)    Physical Exam Vitals and nursing note reviewed.  Constitutional:      General: She is awake. She is not in acute distress.    Appearance: She is well-developed. She is obese. She is not ill-appearing.  HENT:     Head: Normocephalic.     Right Ear: Hearing, tympanic membrane, ear canal and external ear normal.     Left Ear: Hearing, tympanic membrane, ear canal and external ear normal.  Eyes:     General: Lids are normal.        Right eye: No discharge.        Left eye: No discharge.     Conjunctiva/sclera: Conjunctivae normal.     Pupils: Pupils are equal, round, and reactive to light.  Neck:     Vascular: No carotid bruit.  Cardiovascular:     Rate and Rhythm: Normal rate and regular rhythm.     Heart sounds: Normal heart sounds. No murmur heard.    No gallop.  Pulmonary:     Effort: Pulmonary effort is normal. No accessory muscle usage or respiratory distress.     Breath sounds: Decreased breath sounds and wheezing present.     Comments: Diminished throughout per baseline with occasional expiratory wheezes. Abdominal:     General: Bowel sounds are normal.     Palpations: Abdomen is soft.  Musculoskeletal:     Cervical back: Normal range of motion and neck supple.     Right lower leg: No edema.     Left lower leg: No edema.  Skin:    General: Skin is warm and dry.  Neurological:     Mental Status: She is alert and oriented to person, place, and time.  Psychiatric:        Attention and Perception: Attention normal.        Mood and Affect: Mood normal.        Speech: Speech normal.        Behavior: Behavior normal. Behavior is cooperative.        Thought Content: Thought content normal.    Results for  orders placed or performed during the hospital encounter of 04/30/22  Glucose,  capillary  Result Value Ref Range   Glucose-Capillary 88 70 - 99 mg/dL      Assessment & Plan:   Problem List Items Addressed This Visit       Cardiovascular and Mediastinum   Atrial fibrillation with RVR ([place])    Diagnosed 04/14/22 in hospital.  At this time HR stable and no symptoms.  Continue collaboration with cardiology and current medication regimen, adjust as needed.  Monitor thyroid closely with Amiodarone.          Other   Chronic pain    Chronic, ongoing with poor control to lumbar spine.  Scheduled to see pain clinic tomorrow. She refuses home health for PT.  Is aware PCP does not perform chronic pain management. Continue to work on modest weight loss.      Depression, major, single episode, in partial remission ([place]) - Primary    Chronic, stable at this time. Denies SI/HI.  Symptoms improved with Effexor, continue Effexor XL 150 MG daily. Need to avoid benzo due to chronic pain and possible opioid use in future with pain management + her underlying COPD.  She is aware of this and reason benzo not provided.  Considered Prozac, as worked well in past, but this can interact with Amiodarone (QT prolongation) - will not initiate this at this time.  Consider psychiatry referral in future.        Morbid obesity (HCC)    BMI 38.21 with HTN and COPD.  Recommended eating smaller high protein, low fat meals more frequently and exercising 30 mins a day 5 times a week with a goal of 10-15lb weight loss in the next 3 months. Patient voiced their understanding and motivation to adhere to these recommendations.  Tolerating Ozempic with total 34 pounds loss thus far since starting -- will increase to 2 MG weekly.  Educated her on this.  No history of thyroid cancer in patient or her family.  Return in 8 weeks for weight check.       Relevant Medications   Semaglutide, 2 MG/DOSE, 8 MG/3ML SOPN     Follow up  plan: Return in about 8 weeks (around 09/01/2022) for WEIGHT CHECK & CHRONIC PAIN.

## 2022-07-07 NOTE — Assessment & Plan Note (Signed)
Chronic, ongoing with poor control to lumbar spine.  Scheduled to see pain clinic tomorrow. She refuses home health for PT.  Is aware PCP does not perform chronic pain management. Continue to work on modest weight loss.

## 2022-07-08 DIAGNOSIS — M545 Low back pain, unspecified: Secondary | ICD-10-CM | POA: Diagnosis not present

## 2022-07-08 DIAGNOSIS — I2510 Atherosclerotic heart disease of native coronary artery without angina pectoris: Secondary | ICD-10-CM | POA: Diagnosis not present

## 2022-07-08 DIAGNOSIS — F1120 Opioid dependence, uncomplicated: Secondary | ICD-10-CM | POA: Diagnosis not present

## 2022-07-08 DIAGNOSIS — G8929 Other chronic pain: Secondary | ICD-10-CM | POA: Diagnosis not present

## 2022-07-08 DIAGNOSIS — Z716 Tobacco abuse counseling: Secondary | ICD-10-CM | POA: Diagnosis not present

## 2022-07-08 DIAGNOSIS — F419 Anxiety disorder, unspecified: Secondary | ICD-10-CM | POA: Diagnosis not present

## 2022-07-08 DIAGNOSIS — M47816 Spondylosis without myelopathy or radiculopathy, lumbar region: Secondary | ICD-10-CM | POA: Diagnosis not present

## 2022-07-08 DIAGNOSIS — Z79899 Other long term (current) drug therapy: Secondary | ICD-10-CM | POA: Diagnosis not present

## 2022-07-08 DIAGNOSIS — I4891 Unspecified atrial fibrillation: Secondary | ICD-10-CM | POA: Diagnosis not present

## 2022-07-08 DIAGNOSIS — Z720 Tobacco use: Secondary | ICD-10-CM | POA: Diagnosis not present

## 2022-07-27 ENCOUNTER — Encounter: Payer: Self-pay | Admitting: Nurse Practitioner

## 2022-07-27 MED ORDER — POTASSIUM CHLORIDE CRYS ER 20 MEQ PO TBCR: 20.0000 meq | EXTENDED_RELEASE_TABLET | Freq: Every day | ORAL | 4 refills | Status: DC

## 2022-07-28 ENCOUNTER — Telehealth: Payer: Self-pay | Admitting: *Deleted

## 2022-07-28 NOTE — Patient Outreach (Signed)
  Care Coordination   07/28/2022 Name: [name] MRN: [id number] DOB: [birth year]   Care Coordination Outreach Attempts:  An unsuccessful telephone outreach was attempted today to offer the patient information about available care coordination services as a benefit of their health plan.   Follow Up Plan:  Additional outreach attempts will be made to offer the patient care coordination information and services.   Encounter Outcome:  Pt. Request to Call Back  Care Coordination Interventions Activated:  No   Care Coordination Interventions:  No, not indicated    Emelia Loron RN, BSN [place] [phone] [email or web address]

## 2022-07-31 ENCOUNTER — Other Ambulatory Visit: Payer: Self-pay | Admitting: Nurse Practitioner

## 2022-07-31 ENCOUNTER — Encounter: Payer: Self-pay | Admitting: Nurse Practitioner

## 2022-07-31 DIAGNOSIS — R599 Enlarged lymph nodes, unspecified: Secondary | ICD-10-CM

## 2022-08-12 ENCOUNTER — Ambulatory Visit
Admission: RE | Admit: 2022-08-12 | Discharge: 2022-08-12 | Disposition: A | Discharge status: Home / Self Care | Payer: PPO | Source: Ambulatory Visit | Attending: Nurse Practitioner | Admitting: Nurse Practitioner

## 2022-08-12 DIAGNOSIS — E278 Other specified disorders of adrenal gland: Secondary | ICD-10-CM | POA: Diagnosis not present

## 2022-08-12 DIAGNOSIS — K8020 Calculus of gallbladder without cholecystitis without obstruction: Secondary | ICD-10-CM | POA: Diagnosis not present

## 2022-08-12 DIAGNOSIS — R599 Enlarged lymph nodes, unspecified: Secondary | ICD-10-CM | POA: Diagnosis not present

## 2022-08-14 ENCOUNTER — Encounter: Payer: Self-pay | Admitting: Nurse Practitioner

## 2022-08-14 DIAGNOSIS — K429 Umbilical hernia without obstruction or gangrene: Secondary | ICD-10-CM | POA: Insufficient documentation

## 2022-08-14 NOTE — Progress Notes (Signed)
Contacted via MyChart   Good evening [name], your imaging has returned and previous lymph nodes remain mildly enlarged but stable with no changes and no new enlargements.  Great news!!! 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  There are some gallstones but no inflammation, we will keep an eye on these.  A stable, non changing, adrenal mass is present, these sit above kidneys -- if no changes this often means non cancerous and no follow-up imaging recommended.  Only concern was some possible cirrhosis of liver, however recent liver labs have been stable.  So we will monitor and discuss more next visit.  Any questions?

## 2022-08-21 DIAGNOSIS — Z5181 Encounter for therapeutic drug level monitoring: Secondary | ICD-10-CM | POA: Diagnosis not present

## 2022-08-21 DIAGNOSIS — Z79899 Other long term (current) drug therapy: Secondary | ICD-10-CM | POA: Diagnosis not present

## 2022-08-21 DIAGNOSIS — R0789 Other chest pain: Secondary | ICD-10-CM | POA: Diagnosis not present

## 2022-09-01 ENCOUNTER — Telehealth (INDEPENDENT_AMBULATORY_CARE_PROVIDER_SITE_OTHER): Payer: PPO | Admitting: Nurse Practitioner

## 2022-09-01 ENCOUNTER — Encounter: Payer: Self-pay | Admitting: Nurse Practitioner

## 2022-09-01 DIAGNOSIS — L659 Nonscarring hair loss, unspecified: Secondary | ICD-10-CM | POA: Diagnosis not present

## 2022-09-01 DIAGNOSIS — G894 Chronic pain syndrome: Secondary | ICD-10-CM

## 2022-09-01 DIAGNOSIS — F324 Major depressive disorder, single episode, in partial remission: Secondary | ICD-10-CM | POA: Diagnosis not present

## 2022-09-01 DIAGNOSIS — E278 Other specified disorders of adrenal gland: Secondary | ICD-10-CM

## 2022-09-01 DIAGNOSIS — K760 Fatty (change of) liver, not elsewhere classified: Secondary | ICD-10-CM | POA: Diagnosis not present

## 2022-09-01 DIAGNOSIS — K769 Liver disease, unspecified: Secondary | ICD-10-CM | POA: Insufficient documentation

## 2022-09-01 NOTE — Assessment & Plan Note (Signed)
Followed by Dr. Humphrey Rolls with pain clinic, continue this collaboration.  Has follow-up tomorrow.

## 2022-09-01 NOTE — Progress Notes (Signed)
Wt 206 lb 12.8 oz (93.8 kg)   LMP  (LMP Unknown)   BMI 35.50 kg/m    Subjective:    Patient ID: [name], female    DOB: Aug 23, 1956, 66 y.o.   MRN: [id number]  HPI: [name] is a 66 y.o. female  Chief Complaint  Patient presents with   Weight Check   Pain   Psoriasis    Patient says she would like to discuss diagnosis and hair loss with provider.   Alopecia   This visit was completed via video visit through MyChart due to the restrictions of the COVID-19 pandemic. All issues as above were discussed and addressed. Physical exam was done as above through visual confirmation on video through MyChart. If it was felt that the patient should be evaluated in the office, they were directed there. The patient verbally consented to this visit. Location of the patient: home Location of the provider: home Those involved with this call:  Provider: Marnee Guarneri, DNP CMA: Irena Reichmann, [place] Desk/Registration: [employer]  Time spent on call:  21 minutes with patient face to face via video conference. More than 50% of this time was spent in counseling and coordination of care. 15 minutes total spent in review of patient's record and preparation of their chart.  I verified patient identity using two factors (patient name and date of birth). Patient consents verbally to being seen via telemedicine visit today.    WEIGHT LOSS: Continues on Ozempic for weight loss/previous A1c elevation and has lost another 16 pounds pounds since recent visit.  Continues to feel reduced appetite with this.  Started 12/10/21 at 256 lbs and now down to 206 lbs = total 50 pounds loss.  Taking 2 MG Ozempic weekly, which is offering her overall benefit to weight loss, which is turn is benefiting overall health and chronic pain.  Was seen by cardiology recently -- took off Pacerone and Metoprolol -- due to concerns for cirrhosis recent imaging.  Placed her on Sotalol.  Reports she is noticing some  issues with hair loss, over the past month has noticed this.    DEPRESSION Continues Effexor 150 MG daily which has offered benefit.  Continues on Buspar 20 MG TID and Vistaril 10 MG TID PRN.  Had initial visit with pain clinic a few weeks ago and was placed on Tramadol, which is not offering much benefit.  Sees Dr. Humphrey Rolls again tomorrow to discuss.   Mood status: stable Satisfied with current treatment?: yes Symptom severity: moderate  Duration of current treatment : chronic Side effects: no Medication compliance: good compliance Psychotherapy/counseling: none Depressed mood: no Anxious mood: rarely Anhedonia: no Significant weight loss or gain: no Insomnia: none -- uses CPAP 100% of the time Fatigue: no Feelings of worthlessness or guilt: no Impaired concentration/indecisiveness: no Suicidal ideations: no Hopelessness: no Crying spells: no    07/07/2022    3:32 PM 05/25/2022    3:11 PM 04/21/2022    7:09 PM 03/10/2022    2:11 PM 12/11/2021   12:08 PM  Depression screen PHQ 2/9  Decreased Interest 0 0 1 0 0  Down, Depressed, Hopeless 0 0 1 0 0  PHQ - 2 Score 0 0 2 0 0  Altered sleeping 0 0 2 0 0  Tired, decreased energy 0 0 1 0 0  Change in appetite 0 0 0 0 0  Feeling bad or failure about yourself  0 0 0 0 0  Trouble concentrating 0 0  0 0 0  Moving slowly or fidgety/restless 0 0 0 0 0  Suicidal thoughts 0 0 0 0 0  PHQ-9 Score 0 0 5 0 0  Difficult doing work/chores Not difficult at all Not difficult at all          07/07/2022    3:32 PM 05/25/2022    3:10 PM 04/21/2022    7:09 PM 03/10/2022    2:11 PM  GAD 7 : Generalized Anxiety Score  Nervous, Anxious, on Edge '1 3 2 '$ 0  Control/stop worrying 0 0 2 0  Worry too much - different things 0 0 2 0  Trouble relaxing 0 0 2 0  Restless 0 0 2 0  Easily annoyed or irritable 0 0 3 0  Afraid - awful might happen 0 0 2 0  Total GAD 7 Score '1 3 15 '$ 0  Anxiety Difficulty Not difficult at all Not difficult at all Very difficult Not  difficult at all   Relevant past medical, surgical, family and social history reviewed and updated as indicated. Interim medical history since our last visit reviewed. Allergies and medications reviewed and updated.  Review of Systems  Constitutional:  Negative for activity change, appetite change, diaphoresis, fatigue and fever.  HENT:  Negative for ear discharge and ear pain.   Respiratory:  Negative for cough, chest tightness, shortness of breath and wheezing.   Cardiovascular:  Negative for chest pain, palpitations and leg swelling.  Gastrointestinal: Negative.   Neurological: Negative.   Psychiatric/Behavioral:  Negative for decreased concentration, self-injury, sleep disturbance and suicidal ideas. The patient is not nervous/anxious.    Per HPI unless specifically indicated above     Objective:    Wt 206 lb 12.8 oz (93.8 kg)   LMP  (LMP Unknown)   BMI 35.50 kg/m   Wt Readings from Last 3 Encounters:  09/01/22 206 lb 12.8 oz (93.8 kg)  07/07/22 222 lb 9.6 oz (101 kg)  05/25/22 229 lb 6.4 oz (104.1 kg)    Physical Exam Vitals and nursing note reviewed.  Constitutional:      General: She is awake. She is not in acute distress.    Appearance: She is well-developed and well-groomed. She is not ill-appearing or toxic-appearing.  HENT:     Head: Normocephalic.     Right Ear: Hearing normal.     Left Ear: Hearing normal.  Eyes:     General: Lids are normal.        Right eye: No discharge.        Left eye: No discharge.     Conjunctiva/sclera: Conjunctivae normal.  Pulmonary:     Effort: Pulmonary effort is normal. No accessory muscle usage or respiratory distress.  Musculoskeletal:     Cervical back: Normal range of motion.  Neurological:     Mental Status: She is alert and oriented to person, place, and time.  Psychiatric:        Attention and Perception: Attention normal.        Mood and Affect: Mood normal.        Behavior: Behavior normal. Behavior is cooperative.         Thought Content: Thought content normal.        Judgment: Judgment normal.    Results for orders placed or performed during the hospital encounter of 04/30/22  Glucose, capillary  Result Value Ref Range   Glucose-Capillary 88 70 - 99 mg/dL      Assessment & Plan:   Problem List  Items Addressed This Visit       Digestive   Fatty liver    ?cirrhosis on recent imaging.  LFTs have been stable.  Cardiology discontinued Pacerone due to findings on imaging, will plan on obtaining ultrasound to follow-up in a few months.  Discussed with patient at length.  No alcohol use at home.          Other   Chronic pain    Followed by Dr. Humphrey Rolls with pain clinic, continue this collaboration.  Has follow-up tomorrow.      Relevant Medications   traMADol (ULTRAM) 50 MG tablet   Depression, major, single episode, in partial remission (H[facility])    Chronic, stable at this time. Denies SI/HI.  Symptoms improved with Effexor, continue Effexor XL 150 MG daily. Need to avoid benzo due to chronic pain and possible opioid use in future with pain management + her underlying COPD.  She is aware of this and reason benzo not provided.  Considered Prozac, as worked well in past, but this could interact with cardiac medications (QT prolongation) - will not initiate this at this time.  Consider psychiatry referral in future.        Mass of right adrenal gland [facility])    Noted on imaging, CT lung scans, since 2017, remaining stable in size at 3.3 cm on recent September 2023 imaging.  Continue to monitor and consider referral if any changes.  CMP outpatient.      Morbid obesity ([place]) - Primary    BMI 38.21 with HTN and COPD.  Recommended eating smaller high protein, low fat meals more frequently and exercising 30 mins a day 5 times a week with a goal of 10-15lb weight loss in the next 3 months. Patient voiced their understanding and motivation to adhere to these recommendations.  Tolerating Ozempic with total 50  pounds loss thus far since starting -- will continue 2 MG weekly dosing.  Educated her on this.  No history of thyroid cancer in patient or her family.  Return in 3 months for weight check.       Other Visit Diagnoses     Hair loss       Check thyroid labs outpatient.   Relevant Orders   T4, free   TSH       I discussed the assessment and treatment plan with the patient. The patient was provided an opportunity to ask questions and all were answered. The patient agreed with the plan and demonstrated an understanding of the instructions.   The patient was advised to call back or seek an in-person evaluation if the symptoms worsen or if the condition fails to improve as anticipated.   I provided 21+ minutes of time during this encounter.    Follow up plan: Return in about 3 months (around 12/02/2022) for MOOD, PAIN, WEIGHT CHECK, EMPHYSEMA, HTN/HLD, KIDNEY/LIVER CHECK.

## 2022-09-01 NOTE — Assessment & Plan Note (Signed)
Noted on imaging, CT lung scans, since 2017, remaining stable in size at 3.3 cm on recent September 2023 imaging.  Continue to monitor and consider referral if any changes.  CMP outpatient.

## 2022-09-01 NOTE — Assessment & Plan Note (Signed)
BMI 38.21 with HTN and COPD.  Recommended eating smaller high protein, low fat meals more frequently and exercising 30 mins a day 5 times a week with a goal of 10-15lb weight loss in the next 3 months. Patient voiced their understanding and motivation to adhere to these recommendations.  Tolerating Ozempic with total 50 pounds loss thus far since starting -- will continue 2 MG weekly dosing.  Educated her on this.  No history of thyroid cancer in patient or her family.  Return in 3 months for weight check.

## 2022-09-01 NOTE — Patient Instructions (Signed)
Cirrhosis  Cirrhosis is long-term (chronic) liver injury. The liver is the body's largest internal organ, and it performs many functions. It converts food into energy, removes toxic material from the blood, makes important proteins, and absorbs necessary vitamins from food. In cirrhosis, healthy liver cells are replaced by scar tissue. This prevents blood from flowing through the liver and makes it difficult for the liver to complete its functions. What are the causes? Common causes of this condition are hepatitis C and long-term alcohol abuse. Other causes include: Nonalcoholic fatty liver disease (NAFLD). This happens when fat is deposited in the liver by causes other than alcohol. Hepatitis B infection. Autoimmune hepatitis. In this condition, the body's defense system (immune system) mistakenly attacks the liver cells, causing inflammation. Diseases that cause blockage of ducts inside the liver. Inherited liver diseases, such as hemochromatosis. This is one of the most common inherited liver diseases. In this disease, deposits of iron collect in the liver and other organs. Reactions to certain long-term medicines, such as amiodarone, a heart medicine. Parasitic infections. These include schistosomiasis, which is caused by a flatworm. Long-term contact to certain toxins. These toxins include certain organic solvents, such as toluene and chloroform. What increases the risk? You are more likely to develop this condition if: You have certain types of viral hepatitis. You abuse alcohol, especially if you are female. You are overweight. You use IV drugs and share needles. You have unprotected sex with someone who has viral hepatitis. What are the signs or symptoms? You may not have any signs and symptoms at first. Symptoms may not develop until the damage to your liver starts to get worse. Early symptoms may include: Weakness and tiredness (fatigue). Changes in sleep patterns or having trouble  sleeping. Itchiness. Tenderness in the right-upper part of your abdomen. Weight loss and muscle loss. Nausea. Loss of appetite. Later symptoms may include: Fatigue or weakness that is getting worse. Yellow skin and eyes (jaundice). Buildup of fluid in the abdomen (ascites). You may notice that your clothes are tight around your waist. Weight gain and swelling of the feet and ankles (edema). Trouble breathing. Easy bruising and bleeding. Vomiting blood, or black or bloody stool. Mental confusion. How is this diagnosed? Your health care provider may suspect cirrhosis based on your symptoms and medical history, especially if you have other medical conditions or a history of alcohol abuse. Your health care provider will do a physical exam to feel your liver and to check for signs of cirrhosis. Tests may include: Blood tests to check: For hepatitis B or C. Kidney function. Liver function. Imaging tests such as: MRI or CT scan to look for changes seen in advanced cirrhosis. Ultrasound to see if normal liver tissue is being replaced by scar tissue. A procedure in which a long needle is used to take a sample of liver tissue to be checked in a lab (biopsy). Liver biopsy can confirm the diagnosis of cirrhosis. How is this treated? Treatment for this condition depends on how damaged your liver is and what caused the damage. It may include treating the symptoms of cirrhosis, or treating the underlying causes to slow the damage. Treatment may include: Making lifestyle changes, such as: Eating a healthy diet. You may need to work with your health care provider or a dietitian to develop an eating plan. Restricting salt intake. Maintaining a healthy weight. Not abusing drugs or alcohol. Taking medicines to: Treat liver infections or other infections. Control itching. Reduce fluid buildup. Reduce certain blood   toxins. Reduce risk of bleeding from enlarged blood vessels in the stomach or esophagus  (varices). Liver transplant. In this procedure, a liver from a donor is used to replace your diseased liver. This is done if cirrhosis has caused liver failure. Other treatments and procedures may be done depending on the problems that you get from cirrhosis. Common problems include liver-related kidney failure (hepatorenal syndrome). Follow these instructions at home:  Take medicines only as told by your health care provider. Do not use medicines that are toxic to your liver. Ask your health care provider before taking any new medicines, including over-the-counter medicines such as NSAIDs. Rest as needed. Eat a well-balanced diet. Limit your salt or water intake, if your health care provider asks you to do this. Do not drink alcohol. This is especially important if you routinely take acetaminophen. Keep all follow-up visits. This is important. Contact a health care provider if you: Have fatigue or weakness that is getting worse. Develop swelling of the hands, feet, or legs, or a buildup of fluid in the abdomen (ascites). Have a fever or chills. Develop loss of appetite. Have nausea or vomiting. Develop jaundice. Develop easy bruising or bleeding. Get help right away if you: Vomit bright red blood or a material that looks like coffee grounds. Have blood in your stools. Notice that your stools appear black and tarry. Become confused. Have chest pain or trouble breathing. These symptoms may represent a serious problem that is an emergency. Do not wait to see if the symptoms will go away. Get medical help right away. Call your local emergency services (911 in the [place] Do not drive yourself to the hospital. Summary Cirrhosis is chronic liver injury. Common causes are hepatitis C and long-term alcohol abuse. Tests used to diagnose cirrhosis include blood tests, imaging tests, and liver biopsy. Treatment for this condition involves treating the underlying cause. Avoid alcohol, drugs, salt,  and medicines that may damage your liver. Get help right away if you vomit bright red blood or a material that looks like coffee grounds. This information is not intended to replace advice given to you by your health care provider. Make sure you discuss any questions you have with your health care provider. Document Revised: 08/29/2020 Document Reviewed: 08/29/2020 Elsevier Patient Education  2023 Elsevier Inc.  

## 2022-09-01 NOTE — Assessment & Plan Note (Signed)
?  cirrhosis on recent imaging.  LFTs have been stable.  Cardiology discontinued Pacerone due to findings on imaging, will plan on obtaining ultrasound to follow-up in a few months.  Discussed with patient at length.  No alcohol use at home.

## 2022-09-01 NOTE — Assessment & Plan Note (Signed)
Chronic, stable at this time. Denies SI/HI.  Symptoms improved with Effexor, continue Effexor XL 150 MG daily. Need to avoid benzo due to chronic pain and possible opioid use in future with pain management + her underlying COPD.  She is aware of this and reason benzo not provided.  Considered Prozac, as worked well in past, but this could interact with cardiac medications (QT prolongation) - will not initiate this at this time.  Consider psychiatry referral in future.

## 2022-09-02 ENCOUNTER — Other Ambulatory Visit: Payer: PPO

## 2022-09-02 DIAGNOSIS — Z79891 Long term (current) use of opiate analgesic: Secondary | ICD-10-CM | POA: Diagnosis not present

## 2022-09-02 DIAGNOSIS — F320 Major depressive disorder, single episode, mild: Secondary | ICD-10-CM | POA: Diagnosis not present

## 2022-09-02 DIAGNOSIS — Z720 Tobacco use: Secondary | ICD-10-CM | POA: Diagnosis not present

## 2022-09-02 DIAGNOSIS — M48061 Spinal stenosis, lumbar region without neurogenic claudication: Secondary | ICD-10-CM | POA: Diagnosis not present

## 2022-09-02 DIAGNOSIS — L659 Nonscarring hair loss, unspecified: Secondary | ICD-10-CM | POA: Diagnosis not present

## 2022-09-02 DIAGNOSIS — I2510 Atherosclerotic heart disease of native coronary artery without angina pectoris: Secondary | ICD-10-CM | POA: Diagnosis not present

## 2022-09-02 DIAGNOSIS — F1120 Opioid dependence, uncomplicated: Secondary | ICD-10-CM | POA: Diagnosis not present

## 2022-09-02 DIAGNOSIS — M545 Low back pain, unspecified: Secondary | ICD-10-CM | POA: Diagnosis not present

## 2022-09-02 DIAGNOSIS — K760 Fatty (change of) liver, not elsewhere classified: Secondary | ICD-10-CM

## 2022-09-02 DIAGNOSIS — Z716 Tobacco abuse counseling: Secondary | ICD-10-CM | POA: Diagnosis not present

## 2022-09-02 DIAGNOSIS — M47816 Spondylosis without myelopathy or radiculopathy, lumbar region: Secondary | ICD-10-CM | POA: Diagnosis not present

## 2022-09-02 DIAGNOSIS — G8929 Other chronic pain: Secondary | ICD-10-CM | POA: Diagnosis not present

## 2022-09-02 DIAGNOSIS — I4891 Unspecified atrial fibrillation: Secondary | ICD-10-CM | POA: Diagnosis not present

## 2022-09-02 DIAGNOSIS — F419 Anxiety disorder, unspecified: Secondary | ICD-10-CM | POA: Diagnosis not present

## 2022-09-02 DIAGNOSIS — Z79899 Other long term (current) drug therapy: Secondary | ICD-10-CM | POA: Diagnosis not present

## 2022-09-02 NOTE — Progress Notes (Signed)
Called patient to schedule her appointments. I left a voicemail for her to return my call to schedule

## 2022-09-02 NOTE — Progress Notes (Signed)
Pt scheduled  

## 2022-09-03 ENCOUNTER — Encounter: Payer: Self-pay | Admitting: Nurse Practitioner

## 2022-09-03 LAB — COMPREHENSIVE METABOLIC PANEL
ALT: 22 IU/L (ref 0–32)
AST: 34 IU/L (ref 0–40)
Albumin/Globulin Ratio: 1.2 (ref 1.2–2.2)
Albumin: 3.8 g/dL — ABNORMAL LOW (ref 3.9–4.9)
Alkaline Phosphatase: 269 IU/L — ABNORMAL HIGH (ref 44–121)
BUN/Creatinine Ratio: 10 — ABNORMAL LOW (ref 12–28)
BUN: 10 mg/dL (ref 8–27)
Bilirubin Total: 0.3 mg/dL (ref 0.0–1.2)
CO2: 26 mmol/L (ref 20–29)
Calcium: 9.8 mg/dL (ref 8.7–10.3)
Chloride: 102 mmol/L (ref 96–106)
Creatinine, Ser: 1.05 mg/dL — ABNORMAL HIGH (ref 0.57–1.00)
Globulin, Total: 3.2 g/dL (ref 1.5–4.5)
Glucose: 94 mg/dL (ref 70–99)
Potassium: 4.4 mmol/L (ref 3.5–5.2)
Sodium: 140 mmol/L (ref 134–144)
Total Protein: 7 g/dL (ref 6.0–8.5)
eGFR: 59 mL/min/{1.73_m2} — ABNORMAL LOW (ref >59)

## 2022-09-03 LAB — TSH: TSH: 2.93 u[IU]/mL (ref 0.450–4.500)

## 2022-09-03 LAB — T4, FREE: Free T4: 1.81 ng/dL — ABNORMAL HIGH (ref 0.82–1.77)

## 2022-09-03 NOTE — Progress Notes (Signed)
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  Good news!!  Alkaline phosphatase which we sometimes look at with gall bladder has trended up a little.  Do you still have gall bladder?  Would be good to recheck labs next visit and pursue ultrasound liver area to recheck gall bladder and liver.  Liver function, AST and ALT, is normal. - Thyroid labs show normal TSH, but mild elevation in Free T4 -- we will recheck next visit now that they have changed heart medications.  Any questions? Keep being amazing!!  Thank you for allowing me to participate in your care.  I appreciate you. Kindest regards, Jolene  

## 2022-09-03 NOTE — Progress Notes (Signed)
Pt scheduled  

## 2022-09-07 ENCOUNTER — Other Ambulatory Visit: Payer: Self-pay | Admitting: Nurse Practitioner

## 2022-09-07 NOTE — Telephone Encounter (Signed)
Requested Prescriptions  Pending Prescriptions Disp Refills  . busPIRone (BUSPAR) 10 MG tablet [Pharmacy Med Name: BUSPIRONE HCL 10 MG TABLET] 180 tablet 3    Sig: TAKE 2 TABLETS BY MOUTH 3 TIMES A DAY     Psychiatry: Anxiolytics/Hypnotics - Non-controlled Passed - 09/07/2022  2:18 AM      Passed - Valid encounter within last 12 months    Recent Outpatient Visits          6 days ago Morbid obesity ([place])   [place], Jolene T, NP   2 months ago Depression, major, single episode, in partial remission ([place])   [place] Cannady, [place] T, NP   3 months ago Atrial fibrillation with RVR ([place])   [place], Jolene T, NP   4 months ago Atrial fibrillation with RVR ([place])   [place] Cannady, Jolene T, NP   6 months ago Morbid obesity ([place])   [place], Barbaraann Faster, NP      Future Appointments            In 2 months Cannady, Barbaraann Faster, NP [facility]

## 2022-09-16 DIAGNOSIS — Z79899 Other long term (current) drug therapy: Secondary | ICD-10-CM | POA: Diagnosis not present

## 2022-09-16 DIAGNOSIS — Z79891 Long term (current) use of opiate analgesic: Secondary | ICD-10-CM | POA: Diagnosis not present

## 2022-09-16 DIAGNOSIS — F419 Anxiety disorder, unspecified: Secondary | ICD-10-CM | POA: Diagnosis not present

## 2022-09-16 DIAGNOSIS — M47816 Spondylosis without myelopathy or radiculopathy, lumbar region: Secondary | ICD-10-CM | POA: Diagnosis not present

## 2022-09-16 DIAGNOSIS — F329 Major depressive disorder, single episode, unspecified: Secondary | ICD-10-CM | POA: Diagnosis not present

## 2022-09-16 DIAGNOSIS — M545 Low back pain, unspecified: Secondary | ICD-10-CM | POA: Diagnosis not present

## 2022-09-16 DIAGNOSIS — F1120 Opioid dependence, uncomplicated: Secondary | ICD-10-CM | POA: Diagnosis not present

## 2022-09-16 DIAGNOSIS — Z716 Tobacco abuse counseling: Secondary | ICD-10-CM | POA: Diagnosis not present

## 2022-09-16 DIAGNOSIS — I4891 Unspecified atrial fibrillation: Secondary | ICD-10-CM | POA: Diagnosis not present

## 2022-09-16 DIAGNOSIS — I2510 Atherosclerotic heart disease of native coronary artery without angina pectoris: Secondary | ICD-10-CM | POA: Diagnosis not present

## 2022-09-16 DIAGNOSIS — G8929 Other chronic pain: Secondary | ICD-10-CM | POA: Diagnosis not present

## 2022-09-16 DIAGNOSIS — M48061 Spinal stenosis, lumbar region without neurogenic claudication: Secondary | ICD-10-CM | POA: Diagnosis not present

## 2022-09-16 DIAGNOSIS — Z720 Tobacco use: Secondary | ICD-10-CM | POA: Diagnosis not present

## 2022-10-11 ENCOUNTER — Other Ambulatory Visit: Payer: Self-pay | Admitting: Nurse Practitioner

## 2022-10-12 ENCOUNTER — Other Ambulatory Visit: Payer: Self-pay

## 2022-10-12 DIAGNOSIS — R0602 Shortness of breath: Secondary | ICD-10-CM | POA: Diagnosis not present

## 2022-10-12 DIAGNOSIS — Z720 Tobacco use: Secondary | ICD-10-CM | POA: Diagnosis not present

## 2022-10-12 DIAGNOSIS — J449 Chronic obstructive pulmonary disease, unspecified: Secondary | ICD-10-CM | POA: Diagnosis not present

## 2022-10-12 DIAGNOSIS — I10 Essential (primary) hypertension: Secondary | ICD-10-CM | POA: Diagnosis not present

## 2022-10-12 DIAGNOSIS — E782 Mixed hyperlipidemia: Secondary | ICD-10-CM | POA: Diagnosis not present

## 2022-10-12 DIAGNOSIS — N1831 Chronic kidney disease, stage 3a: Secondary | ICD-10-CM | POA: Diagnosis not present

## 2022-10-12 DIAGNOSIS — R0789 Other chest pain: Secondary | ICD-10-CM | POA: Diagnosis not present

## 2022-10-12 DIAGNOSIS — I4891 Unspecified atrial fibrillation: Secondary | ICD-10-CM | POA: Diagnosis not present

## 2022-10-12 DIAGNOSIS — G4730 Sleep apnea, unspecified: Secondary | ICD-10-CM | POA: Diagnosis not present

## 2022-10-12 MED ORDER — GABAPENTIN 800 MG PO TABS
800.0000 mg | ORAL_TABLET | Freq: Two times a day (BID) | ORAL | 4 refills | Status: DC
  Filled 2022-10-12: qty 180, 90d supply, fill #0
  Filled 2023-02-23: qty 180, 90d supply, fill #1

## 2022-10-13 ENCOUNTER — Other Ambulatory Visit: Payer: Self-pay

## 2022-10-14 DIAGNOSIS — M48061 Spinal stenosis, lumbar region without neurogenic claudication: Secondary | ICD-10-CM | POA: Diagnosis not present

## 2022-10-14 DIAGNOSIS — Z716 Tobacco abuse counseling: Secondary | ICD-10-CM | POA: Diagnosis not present

## 2022-10-14 DIAGNOSIS — Z720 Tobacco use: Secondary | ICD-10-CM | POA: Diagnosis not present

## 2022-10-14 DIAGNOSIS — F1120 Opioid dependence, uncomplicated: Secondary | ICD-10-CM | POA: Diagnosis not present

## 2022-10-14 DIAGNOSIS — Z79891 Long term (current) use of opiate analgesic: Secondary | ICD-10-CM | POA: Diagnosis not present

## 2022-10-14 DIAGNOSIS — F419 Anxiety disorder, unspecified: Secondary | ICD-10-CM | POA: Diagnosis not present

## 2022-10-14 DIAGNOSIS — F329 Major depressive disorder, single episode, unspecified: Secondary | ICD-10-CM | POA: Diagnosis not present

## 2022-10-14 DIAGNOSIS — M47816 Spondylosis without myelopathy or radiculopathy, lumbar region: Secondary | ICD-10-CM | POA: Diagnosis not present

## 2022-10-14 DIAGNOSIS — G8929 Other chronic pain: Secondary | ICD-10-CM | POA: Diagnosis not present

## 2022-10-14 DIAGNOSIS — M545 Low back pain, unspecified: Secondary | ICD-10-CM | POA: Diagnosis not present

## 2022-10-14 DIAGNOSIS — I2510 Atherosclerotic heart disease of native coronary artery without angina pectoris: Secondary | ICD-10-CM | POA: Diagnosis not present

## 2022-10-14 DIAGNOSIS — I4891 Unspecified atrial fibrillation: Secondary | ICD-10-CM | POA: Diagnosis not present

## 2022-10-14 DIAGNOSIS — Z79899 Other long term (current) drug therapy: Secondary | ICD-10-CM | POA: Diagnosis not present

## 2022-10-21 ENCOUNTER — Ambulatory Visit
Admission: RE | Admit: 2022-10-21 | Discharge: 2022-10-21 | Disposition: A | Discharge status: Home / Self Care | Payer: PPO | Source: Ambulatory Visit | Attending: Acute Care | Admitting: Acute Care

## 2022-10-21 DIAGNOSIS — J9811 Atelectasis: Secondary | ICD-10-CM | POA: Diagnosis not present

## 2022-10-21 DIAGNOSIS — Z122 Encounter for screening for malignant neoplasm of respiratory organs: Secondary | ICD-10-CM | POA: Insufficient documentation

## 2022-10-21 DIAGNOSIS — F17200 Nicotine dependence, unspecified, uncomplicated: Secondary | ICD-10-CM

## 2022-10-21 DIAGNOSIS — I2510 Atherosclerotic heart disease of native coronary artery without angina pectoris: Secondary | ICD-10-CM | POA: Insufficient documentation

## 2022-10-21 DIAGNOSIS — I700 Atherosclerosis of aorta: Secondary | ICD-10-CM | POA: Insufficient documentation

## 2022-10-21 DIAGNOSIS — R590 Localized enlarged lymph nodes: Secondary | ICD-10-CM | POA: Diagnosis not present

## 2022-10-21 DIAGNOSIS — F17210 Nicotine dependence, cigarettes, uncomplicated: Secondary | ICD-10-CM | POA: Diagnosis not present

## 2022-10-21 DIAGNOSIS — Z87891 Personal history of nicotine dependence: Secondary | ICD-10-CM

## 2022-10-28 ENCOUNTER — Telehealth: Payer: Self-pay | Admitting: Acute Care

## 2022-10-28 DIAGNOSIS — F1120 Opioid dependence, uncomplicated: Secondary | ICD-10-CM | POA: Diagnosis not present

## 2022-10-28 DIAGNOSIS — M4807 Spinal stenosis, lumbosacral region: Secondary | ICD-10-CM | POA: Diagnosis not present

## 2022-10-28 DIAGNOSIS — I4891 Unspecified atrial fibrillation: Secondary | ICD-10-CM | POA: Diagnosis not present

## 2022-10-28 DIAGNOSIS — F329 Major depressive disorder, single episode, unspecified: Secondary | ICD-10-CM | POA: Diagnosis not present

## 2022-10-28 DIAGNOSIS — Z720 Tobacco use: Secondary | ICD-10-CM | POA: Diagnosis not present

## 2022-10-28 DIAGNOSIS — M545 Low back pain, unspecified: Secondary | ICD-10-CM | POA: Diagnosis not present

## 2022-10-28 DIAGNOSIS — I2510 Atherosclerotic heart disease of native coronary artery without angina pectoris: Secondary | ICD-10-CM | POA: Diagnosis not present

## 2022-10-28 DIAGNOSIS — Z79891 Long term (current) use of opiate analgesic: Secondary | ICD-10-CM | POA: Diagnosis not present

## 2022-10-28 DIAGNOSIS — G8929 Other chronic pain: Secondary | ICD-10-CM | POA: Diagnosis not present

## 2022-10-28 DIAGNOSIS — Z79899 Other long term (current) drug therapy: Secondary | ICD-10-CM | POA: Diagnosis not present

## 2022-10-28 DIAGNOSIS — F419 Anxiety disorder, unspecified: Secondary | ICD-10-CM | POA: Diagnosis not present

## 2022-10-28 DIAGNOSIS — M47816 Spondylosis without myelopathy or radiculopathy, lumbar region: Secondary | ICD-10-CM | POA: Diagnosis not present

## 2022-10-28 DIAGNOSIS — Z716 Tobacco abuse counseling: Secondary | ICD-10-CM | POA: Diagnosis not present

## 2022-10-28 NOTE — Telephone Encounter (Signed)
Duplicate documentation. Please see telephone note dated 10/28/22.

## 2022-10-28 NOTE — Telephone Encounter (Signed)
I have attempted to call the patient with the results of their  Low Dose CT Chest Lung cancer screening scan. There was no answer. I have left a HIPPA compliant VM requesting the patient call the office for the scan results. I included the office contact information in the message. We will await their  return call. If no return call we will continue to call until patient is contacted.   [name], patient scan was read as a LR 0 We need to determine if she has been sick and if she has been  treated with antibiotics , and repeat the scan in 3 months after treatment.  3 month follow up CT Chest.  There was also notation of moderate CAD, and aortic atherosclerosis. She is on a statin. Let me know when she returns the call. Thanks so much

## 2022-10-29 ENCOUNTER — Telehealth: Payer: Self-pay | Admitting: Acute Care

## 2022-10-29 NOTE — Telephone Encounter (Signed)
I have attempted to call the patient with the results of their  Low Dose CT Chest Lung cancer screening scan. There was no answer. I have left a HIPPA compliant VM requesting the patient call the office for the scan results. I included the office contact information in the message. We will await his return call. If no return call we will continue to call until patient is contacted.   

## 2022-10-29 NOTE — Telephone Encounter (Signed)
Patient returned call Thursday afternoon. She has reviewed the results in mychart.  Patient denies feeling more congested in her chest or any recent symptoms of illness other than sinus congestion.  She denies feeling more short of breath.  Advised we would have Eric Form, NP give her a call tomorrow to see if she advises any type of treatment before repeating the LDCT in 3 months.  Patient states she is best reached in the afternoon.  Asked patient to be watching for our call tomorrow afternoon after 1:30 pm.

## 2022-10-30 ENCOUNTER — Other Ambulatory Visit: Payer: Self-pay | Admitting: Acute Care

## 2022-10-30 ENCOUNTER — Telehealth: Payer: Self-pay | Admitting: Acute Care

## 2022-10-30 DIAGNOSIS — Z87891 Personal history of nicotine dependence: Secondary | ICD-10-CM

## 2022-10-30 DIAGNOSIS — J441 Chronic obstructive pulmonary disease with (acute) exacerbation: Secondary | ICD-10-CM

## 2022-10-30 DIAGNOSIS — R911 Solitary pulmonary nodule: Secondary | ICD-10-CM

## 2022-10-30 MED ORDER — DOXYCYCLINE HYCLATE 100 MG PO CAPS: 100.0000 mg | ORAL_CAPSULE | Freq: Two times a day (BID) | ORAL | 0 refills | Status: DC

## 2022-10-30 NOTE — Telephone Encounter (Signed)
See other telephone note from 10/30/2022

## 2022-10-30 NOTE — Telephone Encounter (Signed)
Results faxed to PCP with follow up plans included. Order placed for 3 mth nodule f/u [facility].

## 2022-10-30 NOTE — Telephone Encounter (Signed)
I have called the with the results of her low-dose screening CT.  Her scan was read as a lung RADS 0 incomplete due to the finding of bronchial wall thickening of the lingula with associated atelectasis and numerous new clustered centrilobular of the lingula and right lower lobe. Findings are most likely due to infection or aspiration. Follow-up chest CT is recommended in 3 months to ensure resolution.  Pt states that she has noticed increase in her secretions in the morning.  When asked color of secretions she stated they were green to gray.  This is not her baseline. I have sent in doxycycline 100 mg twice daily x 7 days to treat what I suspect is a COPD exacerbation. She has been instructed to complete antibiotics and we will do follow-up low-dose CT in February 2024 which is a 64-monthfollow-up.  Patient denies any choking while drinking or eating .  Denise please place follow-up low-dose screening CT for 3 months and fax results to PCP Let them know we will treat with antibiotics and repeat CT in 3 months. Thanks so much.

## 2022-11-19 ENCOUNTER — Other Ambulatory Visit: Payer: Self-pay | Admitting: Nurse Practitioner

## 2022-11-19 NOTE — Telephone Encounter (Signed)
Unable to refill per protocol, request is too soon. Last refill 10/27/21 for 90 days and 4 refills. Will refuse.  Requested Prescriptions  Pending Prescriptions Disp Refills   atorvastatin (LIPITOR) 80 MG tablet [Pharmacy Med Name: ATORVASTATIN 80 MG TABLET] 90 tablet 1    Sig: TAKE 1 TABLET BY MOUTH EVERY DAY     Cardiovascular:  Antilipid - Statins Failed - 11/19/2022  2:03 AM      Failed - Lipid Panel in normal range within the last 12 months    Cholesterol, Total  Date Value Ref Range Status  12/10/2021 150 100 - 199 mg/dL Final   Cholesterol  Date Value Ref Range Status  04/15/2022 123 0 - 200 mg/dL Final   Cholesterol Piccolo, Waived  Date Value Ref Range Status  12/01/2016 186 <200 mg/dL Final    Comment:                            Desirable                <200                         Borderline High      200- 239                         High                     >239    LDL Chol Calc (NIH)  Date Value Ref Range Status  12/10/2021 78 0 - 99 mg/dL Final   LDL Cholesterol  Date Value Ref Range Status  04/15/2022 64 0 - 99 mg/dL Final    Comment:           Total Cholesterol/HDL:CHD Risk Coronary Heart Disease Risk Table                     Men   Women  1/2 Average Risk   3.4   3.3  Average Risk       5.0   4.4  2 X Average Risk   9.6   7.1  3 X Average Risk  23.4   11.0        Use the calculated Patient Ratio above and the CHD Risk Table to determine the patient's CHD Risk.        ATP III CLASSIFICATION (LDL):  <100     mg/dL   Optimal  100-129  mg/dL   Near or Above                    Optimal  130-159  mg/dL   Borderline  160-189  mg/dL   High  >190     mg/dL   Very High Performed at [facility], [place], [place] [address]    HDL  Date Value Ref Range Status  04/15/2022 29 (L) >40 mg/dL Final  12/10/2021 47 >39 mg/dL Final   Triglycerides  Date Value Ref Range Status  04/15/2022 148 <150 mg/dL Final   Triglycerides  Piccolo,Waived  Date Value Ref Range Status  12/01/2016 177 (H) <150 mg/dL Final    Comment:                            Normal                   <  150                         Borderline High     150 - 199                         High                200 - 499                         Very High                >499          Passed - Patient is not pregnant      Passed - Valid encounter within last 12 months    Recent Outpatient Visits           2 months ago Morbid obesity ([place])   [place], Jolene T, NP   4 months ago Depression, major, single episode, in partial remission ([place])   [place] Cannady, Jolene T, NP   5 months ago Atrial fibrillation with RVR ([place])   [place], Jolene T, NP   7 months ago Atrial fibrillation with RVR ([place])   [place] Cannady, Jolene T, NP   8 months ago Morbid obesity ([place])   [place], Barbaraann Faster, NP       Future Appointments             In 1 week Cannady, Barbaraann Faster, NP [facility]

## 2022-11-23 ENCOUNTER — Other Ambulatory Visit: Payer: Self-pay | Admitting: Nurse Practitioner

## 2022-11-25 NOTE — Telephone Encounter (Signed)
Unable to refill per protocol, Rx request is too soon. Last refill 10/27/21 for 90 and 4 refills. Will refuse.  Requested Prescriptions  Pending Prescriptions Disp Refills   atorvastatin (LIPITOR) 80 MG tablet [Pharmacy Med Name: ATORVASTATIN 80 MG TABLET] 90 tablet 1    Sig: TAKE 1 TABLET BY MOUTH EVERY DAY     Cardiovascular:  Antilipid - Statins Failed - 11/23/2022  4:27 PM      Failed - Lipid Panel in normal range within the last 12 months    Cholesterol, Total  Date Value Ref Range Status  12/10/2021 150 100 - 199 mg/dL Final   Cholesterol  Date Value Ref Range Status  04/15/2022 123 0 - 200 mg/dL Final   Cholesterol Piccolo, Waived  Date Value Ref Range Status  12/01/2016 186 <200 mg/dL Final    Comment:                            Desirable                <200                         Borderline High      200- 239                         High                     >239    LDL Chol Calc (NIH)  Date Value Ref Range Status  12/10/2021 78 0 - 99 mg/dL Final   LDL Cholesterol  Date Value Ref Range Status  04/15/2022 64 0 - 99 mg/dL Final    Comment:           Total Cholesterol/HDL:CHD Risk Coronary Heart Disease Risk Table                     Men   Women  1/2 Average Risk   3.4   3.3  Average Risk       5.0   4.4  2 X Average Risk   9.6   7.1  3 X Average Risk  23.4   11.0        Use the calculated Patient Ratio above and the CHD Risk Table to determine the patient's CHD Risk.        ATP III CLASSIFICATION (LDL):  <100     mg/dL   Optimal  100-129  mg/dL   Near or Above                    Optimal  130-159  mg/dL   Borderline  160-189  mg/dL   High  >190     mg/dL   Very High Performed at [facility], [place], [place] [address]    HDL  Date Value Ref Range Status  04/15/2022 29 (L) >40 mg/dL Final  12/10/2021 47 >39 mg/dL Final   Triglycerides  Date Value Ref Range Status  04/15/2022 148 <150 mg/dL Final   Triglycerides  Piccolo,Waived  Date Value Ref Range Status  12/01/2016 177 (H) <150 mg/dL Final    Comment:                            Normal                   <  150                         Borderline High     150 - 199                         High                200 - 499                         Very High                >499          Passed - Patient is not pregnant      Passed - Valid encounter within last 12 months    Recent Outpatient Visits           2 months ago Morbid obesity ([place])   [place], Jolene T, NP   4 months ago Depression, major, single episode, in partial remission ([place])   [place] Cannady, Jolene T, NP   6 months ago Atrial fibrillation with RVR ([place])   [place], Jolene T, NP   7 months ago Atrial fibrillation with RVR ([place])   [place] Cannady, Jolene T, NP   8 months ago Morbid obesity ([place])   [place], Barbaraann Faster, NP       Future Appointments             In 1 week Cannady, Barbaraann Faster, NP [facility]

## 2022-11-30 ENCOUNTER — Other Ambulatory Visit: Payer: Self-pay | Admitting: Nurse Practitioner

## 2022-11-30 NOTE — Patient Instructions (Signed)
Living with COPD Being diagnosed with chronic obstructive pulmonary disease (COPD) changes your life physically and emotionally. Having COPD can affect your ability to work and do things you enjoy. COPD is not the same for everyone, and it may change over time. Your health care providers can help you come up with the COPD management plan that works best for you. How to manage lifestyle changes Treatment plan Work closely with your health care providers. Follow your COPD management plan. This plan includes: Instructions about activities, exercises, diet, medicines, what to do when COPD flares up, and when to call your health care provider. A pulmonary rehabilitation program. In pulmonary rehab, you will learn about COPD, do exercises for fitness and breathing, and get support from health care providers and other people who have COPD. Managing emotions and stress Living with a chronic disease means you may also struggle with stressful emotions, such as sadness, fear, and worry. Here are some ways to manage these emotions: Talk to someone about your fear, anxiety, depression, or stress. Learn strategies to avoid or reduce stress and ask for help if you are struggling with depression or anxiety. Consider joining a COPD support group, online or in person.  Adjusting to changes COPD may limit the things you can do, but you can make certain changes to help you cope with the diagnosis. Ask for help when you need it. Getting support from friends, family, and your health care team is an important part of managing the condition. Try to get regular exercise as prescribed by a health care provider or pulmonary rehab team. Exercising can help COPD, even if you are a bit short of breath. Take steps to prevent infection and protect your lungs: Wash your hands often and avoid being in crowds. Stay away from friends and family members who are sick. Check your local air quality each day, and stay out of areas  where air pollution is likely. How to recognize changes in your condition Recognizing changes in your COPD COPD is a progressive disease. It is important to let the health care team know if your COPD is getting worse. Your treatment plan may need to change. Watch for: Increased shortness of breath, wheezing, cough, or fatigue. Loss of ability to exercise or perform daily activities, like climbing stairs. More frequent symptom flares. Signs of depression or anxiety. Recognizing stress It is normal to have additional stress when you have COPD. However, prolonged stress and anxiety can make COPD worse and lead to depression. Recognize the warning signs, which include: Feeling sad or worried more often or most of the time. Having less energy and losing interest in pleasurable activities. Changes in your appetite or sleeping patterns. Being easily angered or irritated. Having unexplained aches and pains, digestive problems, or headaches. Follow these instructions at home: Eating and drinking  Eat foods that are high in fiber, such as fresh fruits and vegetables, whole grains, and beans. Limit foods that are high in fat and processed sugars, such as fried or sweet foods. Follow a balanced diet and maintain a healthy weight. Being overweight or underweight can make COPD worse. You may work with a dietitian as part of your pulmonary rehab program. Drink enough fluid to keep your urine pale yellow. If you drink alcohol: Limit how much you have to: 0-1 drink a day for women who are not pregnant. 0-2 drinks a day for men. Know how much alcohol is in your drink. In the [place] one drink equals one 12 oz bottle   of beer (355 mL), one 5 oz glass of wine (148 mL), or one 1 oz glass of hard liquor (44 mL). Lifestyle If you smoke, the most important thing that you can do is to stop smoking. Continuing to smoke will cause the disease to progress faster. Do not use any products that contain nicotine or  tobacco. These products include cigarettes, chewing tobacco, and vaping devices, such as e-cigarettes. If you need help quitting, ask your health care provider. Avoid exposure to things that irritate your lungs, such as smoke, chemicals, and fumes. Activity Balance exercise and rest. Take short walks every 1-2 hours. This is important to improve blood flow and breathing. Ask for help if you feel weak or unsteady. Do exercises that include controlled breathing with body movement, such as tai chi. General instructions Take over-the-counter and prescription medicines only as told by your health care provider. Take vitamin and protein supplements as told by your health care provider or dietitian. Practice good oral hygiene and see your dental care provider regularly. An oral infection can also spread to your lungs. Make sure you receive all the vaccines that your health care provider recommends. Keep all follow-up visits. This is important. Contact a health care provider if you: Are struggling to manage your COPD. Have emotional stress that interferes with your ability to cope with COPD. Get help right away if you: Have thoughts of suicide, death, or hurting yourself or others. If you ever feel like you may hurt yourself or others, or have thoughts about taking your own life, get help right away. Go to your nearest emergency department or: Call your local emergency services (911 in the [place] Call a suicide crisis helpline, such as the National Suicide Prevention Lifeline at 1-800-273-8255 or 988 in the [place] This is open 24 hours a day in the [place] Text the Crisis Text Line at 741741 (in the [place] Summary Being diagnosed with chronic obstructive pulmonary disease (COPD) changes your life physically and emotionally. Work with your health care providers and follow your COPD management plan. A pulmonary rehabilitation program is an important part of COPD management. Prolonged stress, anxiety, and  depression can make COPD worse. Let your health care provider know if emotional stress interferes with your ability to cope with and manage COPD. This information is not intended to replace advice given to you by your health care provider. Make sure you discuss any questions you have with your health care provider. Document Revised: 06/11/2021 Document Reviewed: 12/04/2020 Elsevier Patient Education  2023 [employer]  

## 2022-12-02 ENCOUNTER — Ambulatory Visit (INDEPENDENT_AMBULATORY_CARE_PROVIDER_SITE_OTHER): Payer: PPO | Admitting: Nurse Practitioner

## 2022-12-02 ENCOUNTER — Encounter: Payer: Self-pay | Admitting: Nurse Practitioner

## 2022-12-02 VITALS — BP 114/74 | HR 76 | Temp 98.5°F | Ht 64.02 in | Wt 193.0 lb

## 2022-12-02 DIAGNOSIS — F419 Anxiety disorder, unspecified: Secondary | ICD-10-CM

## 2022-12-02 DIAGNOSIS — J432 Centrilobular emphysema: Secondary | ICD-10-CM

## 2022-12-02 DIAGNOSIS — L03115 Cellulitis of right lower limb: Secondary | ICD-10-CM

## 2022-12-02 DIAGNOSIS — I4891 Unspecified atrial fibrillation: Secondary | ICD-10-CM | POA: Diagnosis not present

## 2022-12-02 DIAGNOSIS — E559 Vitamin D deficiency, unspecified: Secondary | ICD-10-CM | POA: Diagnosis not present

## 2022-12-02 DIAGNOSIS — F17210 Nicotine dependence, cigarettes, uncomplicated: Secondary | ICD-10-CM

## 2022-12-02 DIAGNOSIS — D72821 Monocytosis (symptomatic): Secondary | ICD-10-CM

## 2022-12-02 DIAGNOSIS — G894 Chronic pain syndrome: Secondary | ICD-10-CM

## 2022-12-02 DIAGNOSIS — E278 Other specified disorders of adrenal gland: Secondary | ICD-10-CM | POA: Diagnosis not present

## 2022-12-02 DIAGNOSIS — G4733 Obstructive sleep apnea (adult) (pediatric): Secondary | ICD-10-CM

## 2022-12-02 DIAGNOSIS — Z23 Encounter for immunization: Secondary | ICD-10-CM | POA: Diagnosis not present

## 2022-12-02 DIAGNOSIS — N1831 Chronic kidney disease, stage 3a: Secondary | ICD-10-CM

## 2022-12-02 DIAGNOSIS — I739 Peripheral vascular disease, unspecified: Secondary | ICD-10-CM | POA: Diagnosis not present

## 2022-12-02 DIAGNOSIS — F324 Major depressive disorder, single episode, in partial remission: Secondary | ICD-10-CM | POA: Diagnosis not present

## 2022-12-02 DIAGNOSIS — K760 Fatty (change of) liver, not elsewhere classified: Secondary | ICD-10-CM

## 2022-12-02 DIAGNOSIS — E782 Mixed hyperlipidemia: Secondary | ICD-10-CM

## 2022-12-02 DIAGNOSIS — I700 Atherosclerosis of aorta: Secondary | ICD-10-CM

## 2022-12-02 DIAGNOSIS — R7301 Impaired fasting glucose: Secondary | ICD-10-CM

## 2022-12-02 DIAGNOSIS — I10 Essential (primary) hypertension: Secondary | ICD-10-CM | POA: Diagnosis not present

## 2022-12-02 DIAGNOSIS — M85852 Other specified disorders of bone density and structure, left thigh: Secondary | ICD-10-CM

## 2022-12-02 DIAGNOSIS — K2100 Gastro-esophageal reflux disease with esophagitis, without bleeding: Secondary | ICD-10-CM

## 2022-12-02 LAB — MICROALBUMIN, URINE WAIVED
Creatinine, Urine Waived: 10 mg/dL (ref 10–300)
Microalb, Ur Waived: 30 mg/L — ABNORMAL HIGH (ref 0–19)

## 2022-12-02 MED ORDER — DOXYCYCLINE HYCLATE 100 MG PO TABS: 100.0000 mg | ORAL_TABLET | Freq: Two times a day (BID) | ORAL | 0 refills | Status: AC

## 2022-12-02 MED ORDER — OMEPRAZOLE 40 MG PO CPDR: 40.0000 mg | DELAYED_RELEASE_CAPSULE | Freq: Every day | ORAL | 4 refills | Status: DC

## 2022-12-02 MED ORDER — HYDROXYZINE HCL 10 MG PO TABS: 10.0000 mg | ORAL_TABLET | Freq: Three times a day (TID) | ORAL | 4 refills | Status: DC | PRN

## 2022-12-02 MED ORDER — BACLOFEN 10 MG PO TABS: 5.0000 mg | ORAL_TABLET | Freq: Three times a day (TID) | ORAL | 4 refills | Status: DC

## 2022-12-02 MED ORDER — SPIRIVA RESPIMAT 2.5 MCG/ACT IN AERS: INHALATION_SPRAY | RESPIRATORY_TRACT | 6 refills | Status: DC

## 2022-12-02 MED ORDER — ALBUTEROL SULFATE HFA 108 (90 BASE) MCG/ACT IN AERS: 2.0000 | INHALATION_SPRAY | Freq: Four times a day (QID) | RESPIRATORY_TRACT | 4 refills | Status: DC | PRN

## 2022-12-02 MED ORDER — ATORVASTATIN CALCIUM 80 MG PO TABS: 80.0000 mg | ORAL_TABLET | Freq: Every day | ORAL | 4 refills | Status: DC

## 2022-12-02 MED ORDER — BUSPIRONE HCL 10 MG PO TABS: 20.0000 mg | ORAL_TABLET | Freq: Three times a day (TID) | ORAL | 4 refills | Status: DC

## 2022-12-02 NOTE — Assessment & Plan Note (Signed)
I have recommended complete cessation of tobacco use. I have discussed various options available for assistance with tobacco cessation including over the counter methods (Nicotine gum, patch and lozenges). We also discussed prescription options (Chantix, Nicotine Inhaler / Nasal Spray). The patient is not interested in pursuing any prescription tobacco cessation options at this time.  Continue annual lung screening. 

## 2022-12-02 NOTE — Assessment & Plan Note (Addendum)
Chronic, stable at this time. Denies SI/HI.  Continue Effexor XL 150 MG daily + Buspar and Vistaril. Need to avoid benzo due to chronic pain and opioid use with pain management + her underlying COPD.  She is aware of this and reason benzo not provided.  Considered Prozac, as worked well in past, but this could interact with cardiac medications (QT prolongation) - will not initiate this at this time.  Consider psychiatry referral in future.  Monitor BP with Effexor.

## 2022-12-02 NOTE — Assessment & Plan Note (Signed)
Chronic, ongoing.  Continue current medication regimen and adjust as needed.  Not O2 dependent at this time.  Continue to recommend complete smoking cessation. Spirometry at next visit.  Consider referral to pulmonary if worsening.  May benefit from change to Trelegy or Breztri in future.

## 2022-12-02 NOTE — Assessment & Plan Note (Signed)
Ongoing.  Noted on DEXA 01/26/22 -- at this time recommend focus on healthy diet and continue Vitamin D3 2000 units daily + adequate calcium intake daily.  Repeat DEXA in 5 years.

## 2022-12-02 NOTE — Assessment & Plan Note (Signed)
Refer to depression plan of care. 

## 2022-12-02 NOTE — Assessment & Plan Note (Addendum)
Ongoing.  GFR in the 50's, recent 59.  Recheck today.  Reminded to avoid NSAIDs.  May need to adjust Gabapentin based on labs, will review and determine need to reduce -- discussed with patient.  Labs today: CMP and CBC.  Increase fluid intake at home.

## 2022-12-02 NOTE — Assessment & Plan Note (Signed)
Followed by Dr. Humphrey Rolls with pain clinic, continue this collaboration.

## 2022-12-02 NOTE — Assessment & Plan Note (Signed)
Chronic, ongoing.  Continue statin and adjust as needed -- Atorvastatin 80 MG.  Lipid panel today.

## 2022-12-02 NOTE — Assessment & Plan Note (Addendum)
Chronic, stable.  BP well below goal on check.  Continue current medication regimen and adjust as needed -- is taking BB only at this time, will keep off HCTZ and Telmisartan.  Recommend she monitor BP at home daily and document for provider + focus on DASH diet.  LABS: CMP and CBC + urine ALB -- urine ALB 29 December 2022, may benefit from return to low dose ARB in future.  Refills up to date.

## 2022-12-02 NOTE — Progress Notes (Signed)
BP 114/74   Pulse 76   Temp 98.5 F (36.9 C) (Oral)   Ht 5' 4.02" (1.626 m)   Wt 193 lb (87.5 kg)   LMP  (LMP Unknown)   SpO2 94%   BMI 33.11 kg/m    Subjective:    Patient ID: [name], female    DOB: 08-31-1956, 66 y.o.   MRN: [id number]  HPI: [name] is a 67 y.o. female  Chief Complaint  Patient presents with   Depression   A fib with RVR   Emphysema   ATRIAL FIBRILLATION Saw cardiology 10/12/22 they stopped her Amiodarone and placed her on Sotalol.  Was taken off BP medication -- Telmisartan-HCTZ.  Continues Eliquis and Lasix.   On Atorvastatin 80 MG daily for cholesterol.  Continues on Ozempic for weight loss/previous A1c elevation and has lost another 13 pounds since last visit.  Continues to feel reduced appetite with this, no side effects.  Started 12/10/21 at 256 lbs and now down to 193 lbs = total 63 pounds loss.  She reports has met goal one and now wants to reach goal two of getting obesity off her chart + hernia repair. Atrial fibrillation status: stable Satisfied with current treatment: yes  Medication side effects:  no Medication compliance: good compliance Etiology of atrial fibrillation: unknown Palpitations:  no Chest pain:  no Dyspnea on exertion: no Orthopnea:  no Syncope:  no Edema: no Ventricular rate control: Sotalol Anti-coagulation: long acting   OSTEOPENIA Satisfied with current treatment?: yes Adequate calcium & vitamin D: yes Weight bearing exercises: yes   COPD Taking Spiriva. Continues to smoke anywhere from 1 PPD to less then this.  Goes for annual lung screening, last check on 10/21/22 which noted ongoing aortic atherosclerosis and centrilobular emphysema == this check noticed thickening of lingula she is to return in 3 months.  Abdominal lymph nodes unchanged and stable.  Taking Omeprazole daily for GERD. COPD status: stable Satisfied with current treatment?: yes Oxygen use: no Dyspnea frequency: occasional Cough  frequency: occasional Rescue inhaler frequency:  occasional Limitation of activity: no Productive cough: none Last Spirometry: unknown Pneumovax: Up to Date Influenza: Up to Date  DEPRESSION Continues Effexor 150 MG daily which has offered benefit.  Continues on Buspar 20 MG TID and Vistaril 10 MG TID PRN.    Follows with pain clinic, Dr. Humphrey Rolls for ongoing lower back pain -- currently taking Belbuca which is offering some benefit to pain. Mood status: stable Satisfied with current treatment?: yes Symptom severity: moderate  Duration of current treatment : chronic Side effects: no Medication compliance: good compliance Psychotherapy/counseling: none Depressed mood: no Anxious mood: rarely Anhedonia: no Significant weight loss or gain: no Insomnia: none -- uses CPAP 100% of the time Fatigue: no Feelings of worthlessness or guilt: no Impaired concentration/indecisiveness: no Suicidal ideations: no Hopelessness: no Crying spells: no    12/02/2022    2:57 PM 07/07/2022    3:32 PM 05/25/2022    3:11 PM 04/21/2022    7:09 PM 03/10/2022    2:11 PM  Depression screen PHQ 2/9  Decreased Interest 0 0 0 1 0  Down, Depressed, Hopeless 0 0 0 1 0  PHQ - 2 Score 0 0 0 2 0  Altered sleeping 0 0 0 2 0  Tired, decreased energy 0 0 0 1 0  Change in appetite 0 0 0 0 0  Feeling bad or failure about yourself  0 0 0 0 0  Trouble concentrating 0  0 0 0 0  Moving slowly or fidgety/restless 0 0 0 0 0  Suicidal thoughts 0 0 0 0 0  PHQ-9 Score 0 0 0 5 0  Difficult doing work/chores Not difficult at all Not difficult at all Not difficult at all         12/02/2022    2:58 PM 07/07/2022    3:32 PM 05/25/2022    3:10 PM 04/21/2022    7:09 PM  GAD 7 : Generalized Anxiety Score  Nervous, Anxious, on Edge 0 _0 Control/stop worrying 0 0 0 2  Worry too much - different things 0 0 0 2  Trouble relaxing 0 0 0 2  Restless 0 0 0 2  Easily annoyed or irritable 0 0 0 3  Afraid - awful might happen 0 0 0 2   Total GAD 7 Score 0 _1 Anxiety Difficulty Not difficult at all Not difficult at all Not difficult at all Very difficult   Relevant past medical, surgical, family and social history reviewed and updated as indicated. Interim medical history since our last visit reviewed. Allergies and medications reviewed and updated.  Review of Systems  Constitutional:  Negative for activity change, appetite change, diaphoresis, fatigue and fever.  HENT:  Negative for ear discharge and ear pain.   Respiratory:  Negative for cough, chest tightness, shortness of breath and wheezing.   Cardiovascular:  Negative for chest pain, palpitations and leg swelling.  Gastrointestinal: Negative.   Neurological: Negative.   Psychiatric/Behavioral:  Negative for decreased concentration, self-injury, sleep disturbance and suicidal ideas. The patient is not nervous/anxious.    Per HPI unless specifically indicated above     Objective:    BP 114/74   Pulse 76   Temp 98.5 F (36.9 C) (Oral)   Ht 5' 4.02" (1.626 m)   Wt 193 lb (87.5 kg)   LMP  (LMP Unknown)   SpO2 94%   BMI 33.11 kg/m   Wt Readings from Last 3 Encounters:  12/02/22 193 lb (87.5 kg)  09/01/22 206 lb 12.8 oz (93.8 kg)  07/07/22 222 lb 9.6 oz (101 kg)    Physical Exam Vitals and nursing note reviewed.  Constitutional:      General: She is awake. She is not in acute distress.    Appearance: She is well-developed. She is obese. She is not ill-appearing.  HENT:     Head: Normocephalic.     Right Ear: Hearing, tympanic membrane, ear canal and external ear normal.     Left Ear: Hearing, tympanic membrane, ear canal and external ear normal.  Eyes:     General: Lids are normal.        Right eye: No discharge.        Left eye: No discharge.     Conjunctiva/sclera: Conjunctivae normal.     Pupils: Pupils are equal, round, and reactive to light.  Neck:     Vascular: No carotid bruit.  Cardiovascular:     Rate and Rhythm: Normal rate and  regular rhythm.     Heart sounds: Normal heart sounds. No murmur heard.    No gallop.     Comments: Right leg with moderate erythema around shin area and warmth.  Skin intact with no blisters.  Mild tenderness. Pulmonary:     Effort: Pulmonary effort is normal. No accessory muscle usage or respiratory distress.     Breath sounds: Decreased breath sounds and wheezing present.     Comments:  Diminished throughout per baseline with occasional expiratory wheezes. Abdominal:     General: Bowel sounds are normal.     Palpations: Abdomen is soft.  Musculoskeletal:     Cervical back: Normal range of motion and neck supple.     Right lower leg: 1+ Edema present.     Left lower leg: 1+ Edema present.  Skin:    General: Skin is warm and dry.  Neurological:     Mental Status: She is alert and oriented to person, place, and time.  Psychiatric:        Attention and Perception: Attention normal.        Mood and Affect: Mood normal.        Speech: Speech normal.        Behavior: Behavior normal. Behavior is cooperative.        Thought Content: Thought content normal.    Results for orders placed or performed in visit on 12/02/22  Microalbumin, Urine Waived  Result Value Ref Range   Microalb, Ur Waived 30 (H) 0 - 19 mg/L   Creatinine, Urine Waived 10 10 - 300 mg/dL   Microalb/Creat Ratio 30-300 (H) <30 mg/g      Assessment & Plan:   Problem List Items Addressed This Visit       Cardiovascular and Mediastinum   Aortic atherosclerosis ([place])    Chronic, ongoing, noted on lung CT 10/02/2019.  Recommend she continue ASA and statin daily for prevention.  Recommend complete cessation of smoking.      Relevant Medications   atorvastatin (LIPITOR) 80 MG tablet   Other Relevant Orders   Comprehensive metabolic panel   Lipid Panel w/o Chol/HDL Ratio   Atrial fibrillation with RVR (HCC)    Chronic, stable.  Diagnosed 04/14/22 in hospital.  At this time HR stable and no symptoms.  Continue  collaboration with cardiology and current medication regimen, adjust as needed.        Relevant Medications   atorvastatin (LIPITOR) 80 MG tablet   Other Relevant Orders   Comprehensive metabolic panel   Benign hypertension    Chronic, stable.  BP well below goal on check.  Continue current medication regimen and adjust as needed -- is taking BB only at this time, will keep off HCTZ and Telmisartan.  Recommend she monitor BP at home daily and document for provider + focus on DASH diet.  LABS: CMP and CBC + urine ALB -- urine ALB 29 December 2022, may benefit from return to low dose ARB in future.  Refills up to date.        Relevant Medications   atorvastatin (LIPITOR) 80 MG tablet   Other Relevant Orders   Comprehensive metabolic panel   Microalbumin, Urine Waived (Completed)   PVD (peripheral vascular disease) (HCC)    Chronic, evidenced by decreased hair pattern and baseline edema.  Is long time smoker, recommend complete cessation.  Consider vascular visit in future.  At this time monitor skin closely for wounds and wear compression hose daily.      Relevant Medications   atorvastatin (LIPITOR) 80 MG tablet     Respiratory   Centrilobular emphysema (HCC) - Primary    Chronic, ongoing.  Continue current medication regimen and adjust as needed.  Not O2 dependent at this time.  Continue to recommend complete smoking cessation. Spirometry at next visit.  Consider referral to pulmonary if worsening.  May benefit from change to Trelegy or Breztri in future.      Relevant  Medications   Tiotropium Bromide Monohydrate (SPIRIVA RESPIMAT) 2.5 MCG/ACT AERS   albuterol (PROAIR HFA) 108 (90 Base) MCG/ACT inhaler   Other Relevant Orders   CBC with Differential/Platelet   Sleep apnea    Chronic, ongoing -- continue 100% use of this.          Digestive   Gastroesophageal reflux disease    Chronic, stable with Omeprazole which offers her benefit.  Continue current medication regimen and  check Mag level annually. Risks of PPI use were discussed with patient including bone loss, C. Diff diarrhea, pneumonia, infections, CKD, electrolyte abnormalities.  Verbalizes understanding and chooses to continue the medication.       Relevant Medications   omeprazole (PRILOSEC) 40 MG capsule   Other Relevant Orders   Magnesium     Endocrine   IFG (impaired fasting glucose)    Ongoing with A1c ranging from 5.7 to 5.9%, recent 5.6%.  Continue Ozempic 2 MG weekly to further assist with weight loss.  Educated her on this.  No history of thyroid cancer in patient or her family.  Return in 3 months.  Check A1c and urine ALB today.      Relevant Orders   HgB A1c   Microalbumin, Urine Waived (Completed)     Musculoskeletal and Integument   Osteopenia of neck of left femur    Ongoing.  Noted on DEXA 01/26/22 -- at this time recommend focus on healthy diet and continue Vitamin D3 2000 units daily + adequate calcium intake daily.  Repeat DEXA in 5 years.        Genitourinary   Chronic kidney disease, stage 3a ([facility])    Ongoing.  GFR in the 50's, recent 59.  Recheck today.  Reminded to avoid NSAIDs.  May need to adjust Gabapentin based on labs, will review and determine need to reduce -- discussed with patient.  Labs today: CMP and CBC.  Increase fluid intake at home.      Relevant Orders   Comprehensive metabolic panel     Other   Anxiety    Refer to depression plan of care.      Relevant Medications   hydrOXYzine (ATARAX) 10 MG tablet   busPIRone (BUSPAR) 10 MG tablet   Cellulitis of right lower leg    Acute, will start treatment with Doxycycline 100 MG BID and have return in 2 weeks for follow-up.  Monitor skin closely and notify provider if any open wounds or blisters present.  Compression hose on during day.      Chronic pain    Followed by Dr. Humphrey Rolls with pain clinic, continue this collaboration.       Relevant Medications   Buprenorphine HCl (BELBUCA) 300 MCG FILM    baclofen (LIORESAL) 10 MG tablet   Depression, major, single episode, in partial remission (HCC)    Chronic, stable at this time. Denies SI/HI.  Continue Effexor XL 150 MG daily + Buspar and Vistaril. Need to avoid benzo due to chronic pain and opioid use with pain management + her underlying COPD.  She is aware of this and reason benzo not provided.  Considered Prozac, as worked well in past, but this could interact with cardiac medications (QT prolongation) - will not initiate this at this time.  Consider psychiatry referral in future.  Monitor BP with Effexor.      Relevant Medications   hydrOXYzine (ATARAX) 10 MG tablet   busPIRone (BUSPAR) 10 MG tablet   Hyperlipidemia    Chronic, ongoing.  Continue statin and adjust as needed -- Atorvastatin 80 MG.  Lipid panel today.      Relevant Medications   atorvastatin (LIPITOR) 80 MG tablet   Other Relevant Orders   Comprehensive metabolic panel   Lipid Panel w/o Chol/HDL Ratio   Monocytosis    Was followed by oncology, now to return as needed if any consistent elevations noted >20.  To check CBC Q6MOS -- will recheck today.      Relevant Orders   CBC with Differential/Platelet   Morbid obesity (HCC)    BMI 33.11 with HTN and COPD, A-Fib.  Recommended eating smaller high protein, low fat meals more frequently and exercising 30 mins a day 5 times a week with a goal of 10-15lb weight loss in the next 3 months. Patient voiced their understanding and motivation to adhere to these recommendations.  Tolerating Ozempic with total 63 pounds loss thus far since starting -- will continue 2 MG weekly dosing.  Educated her on this.  No history of thyroid cancer in patient or her family.  Return in 3 months for weight check.       Nicotine dependence, cigarettes, uncomplicated    I have recommended complete cessation of tobacco use. I have discussed various options available for assistance with tobacco cessation including over the counter methods  (Nicotine gum, patch and lozenges). We also discussed prescription options (Chantix, Nicotine Inhaler / Nasal Spray). The patient is not interested in pursuing any prescription tobacco cessation options at this time.  Continue annual lung screening.       Vitamin D deficiency    Chronic, ongoing.  Continue supplement and check Vit D level today.      Relevant Orders   VITAMIN D 25 Hydroxy (Vit-D Deficiency, Fractures)   Other Visit Diagnoses     Flu vaccine need       Flu vaccine in office today, discussed with patient.   Relevant Orders   Flu Vaccine QUAD High Dose(Fluad) (Completed)        Follow up plan: Return in about 2 weeks (around 12/16/2022) for [facility].

## 2022-12-02 NOTE — Assessment & Plan Note (Signed)
Chronic, evidenced by decreased hair pattern and baseline edema.  Is long time smoker, recommend complete cessation.  Consider vascular visit in future.  At this time monitor skin closely for wounds and wear compression hose daily.

## 2022-12-02 NOTE — Assessment & Plan Note (Signed)
Acute, will start treatment with Doxycycline 100 MG BID and have return in 2 weeks for follow-up.  Monitor skin closely and notify provider if any open wounds or blisters present.  Compression hose on during day.

## 2022-12-02 NOTE — Assessment & Plan Note (Signed)
BMI 33.11 with HTN and COPD, A-Fib.  Recommended eating smaller high protein, low fat meals more frequently and exercising 30 mins a day 5 times a week with a goal of 10-15lb weight loss in the next 3 months. Patient voiced their understanding and motivation to adhere to these recommendations.  Tolerating Ozempic with total 63 pounds loss thus far since starting -- will continue 2 MG weekly dosing.  Educated her on this.  No history of thyroid cancer in patient or her family.  Return in 3 months for weight check.

## 2022-12-02 NOTE — Assessment & Plan Note (Signed)
Chronic, stable with Omeprazole which offers her benefit.  Continue current medication regimen and check Mag level annually. Risks of PPI use were discussed with patient including bone loss, C. Diff diarrhea, pneumonia, infections, CKD, electrolyte abnormalities.  Verbalizes understanding and chooses to continue the medication.

## 2022-12-02 NOTE — Assessment & Plan Note (Signed)
Was followed by oncology, now to return as needed if any consistent elevations noted >20.  To check CBC Q6MOS -- will recheck today.

## 2022-12-02 NOTE — Assessment & Plan Note (Signed)
Chronic, ongoing, noted on lung CT 10/02/2019.  Recommend she continue ASA and statin daily for prevention.  Recommend complete cessation of smoking.

## 2022-12-02 NOTE — Assessment & Plan Note (Signed)
Chronic, ongoing.  Continue supplement and check Vit D level today.

## 2022-12-02 NOTE — Assessment & Plan Note (Signed)
Chronic, stable.  Diagnosed 04/14/22 in hospital.  At this time HR stable and no symptoms.  Continue collaboration with cardiology and current medication regimen, adjust as needed.

## 2022-12-02 NOTE — Assessment & Plan Note (Addendum)
Chronic, ongoing -- continue 100% use of this.

## 2022-12-02 NOTE — Assessment & Plan Note (Addendum)
Ongoing with A1c ranging from 5.7 to 5.9%, recent 5.6%.  Continue Ozempic 2 MG weekly to further assist with weight loss.  Educated her on this.  No history of thyroid cancer in patient or her family.  Return in 3 months.  Check A1c and urine ALB today.

## 2022-12-03 LAB — COMPREHENSIVE METABOLIC PANEL
ALT: 11 IU/L (ref 0–32)
AST: 25 IU/L (ref 0–40)
Albumin/Globulin Ratio: 1.1 — ABNORMAL LOW (ref 1.2–2.2)
Albumin: 3.3 g/dL — ABNORMAL LOW (ref 3.9–4.9)
Alkaline Phosphatase: 189 IU/L — ABNORMAL HIGH (ref 44–121)
BUN/Creatinine Ratio: 9 — ABNORMAL LOW (ref 12–28)
BUN: 8 mg/dL (ref 8–27)
Bilirubin Total: 0.2 mg/dL (ref 0.0–1.2)
CO2: 25 mmol/L (ref 20–29)
Calcium: 9.5 mg/dL (ref 8.7–10.3)
Chloride: 102 mmol/L (ref 96–106)
Creatinine, Ser: 0.89 mg/dL (ref 0.57–1.00)
Globulin, Total: 3.1 g/dL (ref 1.5–4.5)
Glucose: 94 mg/dL (ref 70–99)
Potassium: 4.3 mmol/L (ref 3.5–5.2)
Sodium: 142 mmol/L (ref 134–144)
Total Protein: 6.4 g/dL (ref 6.0–8.5)
eGFR: 71 mL/min/{1.73_m2} (ref >59)

## 2022-12-03 LAB — CBC WITH DIFFERENTIAL/PLATELET
Basophils Absolute: 0.1 10*3/uL (ref 0.0–0.2)
Basos: 1 %
EOS (ABSOLUTE): 0.3 10*3/uL (ref 0.0–0.4)
Eos: 2 %
Hematocrit: 41.4 % (ref 34.0–46.6)
Hemoglobin: 13.5 g/dL (ref 11.1–15.9)
Immature Grans (Abs): 0 10*3/uL (ref 0.0–0.1)
Immature Granulocytes: 0 %
Lymphocytes Absolute: 4.3 10*3/uL — ABNORMAL HIGH (ref 0.7–3.1)
Lymphs: 34 %
MCH: 29 pg (ref 26.6–33.0)
MCHC: 32.6 g/dL (ref 31.5–35.7)
MCV: 89 fL (ref 79–97)
Monocytes Absolute: 1.3 10*3/uL — ABNORMAL HIGH (ref 0.1–0.9)
Monocytes: 10 %
Neutrophils Absolute: 6.8 10*3/uL (ref 1.4–7.0)
Neutrophils: 53 %
Platelets: 321 10*3/uL (ref 150–450)
RBC: 4.66 x10E6/uL (ref 3.77–5.28)
RDW: 13.6 % (ref 11.7–15.4)
WBC: 12.9 10*3/uL — ABNORMAL HIGH (ref 3.4–10.8)

## 2022-12-03 LAB — LIPID PANEL W/O CHOL/HDL RATIO
Cholesterol, Total: 158 mg/dL (ref 100–199)
HDL: 39 mg/dL — ABNORMAL LOW (ref >39)
LDL Chol Calc (NIH): 84 mg/dL (ref 0–99)
Triglycerides: 206 mg/dL — ABNORMAL HIGH (ref 0–149)
VLDL Cholesterol Cal: 35 mg/dL (ref 5–40)

## 2022-12-03 LAB — VITAMIN D 25 HYDROXY (VIT D DEFICIENCY, FRACTURES): Vit D, 25-Hydroxy: 55.9 ng/mL (ref 30.0–100.0)

## 2022-12-03 LAB — MAGNESIUM: Magnesium: 2 mg/dL (ref 1.6–2.3)

## 2022-12-03 LAB — HEMOGLOBIN A1C
Est. average glucose Bld gHb Est-mCnc: 105 mg/dL
Hgb A1c MFr Bld: 5.3 % (ref 4.8–5.6)

## 2022-12-03 NOTE — Progress Notes (Signed)
Contacted via MyChart   Good evening [name], your labs have returned: - CBC continues to show mild elevation in white blood cell count and lymphocytes, which we will continue to monitor per recommendations of hematology. - Kidney function, creatinine and eGFR, remains normal, as is liver function, AST and ALT. Alkaline phosphatase a little elevated, remind me, do you have gall bladder still?  This is something we have been monitoring on labs. - A1c has trended down with your weight loss and now is not in prediabetic range:) - Magnesium and Vitamin D normal - Cholesterol labs are stable, however would like to see LDL a little lower.  We may change to Rosuvastatin and stop Atorvastatin next visit.  Any questions? Keep being stellar!!  Thank you for allowing me to participate in your care.  I appreciate you. Kindest regards, Tamika Nou

## 2022-12-07 DIAGNOSIS — I2510 Atherosclerotic heart disease of native coronary artery without angina pectoris: Secondary | ICD-10-CM | POA: Diagnosis not present

## 2022-12-07 DIAGNOSIS — M47816 Spondylosis without myelopathy or radiculopathy, lumbar region: Secondary | ICD-10-CM | POA: Diagnosis not present

## 2022-12-07 DIAGNOSIS — F419 Anxiety disorder, unspecified: Secondary | ICD-10-CM | POA: Diagnosis not present

## 2022-12-07 DIAGNOSIS — I4891 Unspecified atrial fibrillation: Secondary | ICD-10-CM | POA: Diagnosis not present

## 2022-12-07 DIAGNOSIS — F1120 Opioid dependence, uncomplicated: Secondary | ICD-10-CM | POA: Diagnosis not present

## 2022-12-07 DIAGNOSIS — Z716 Tobacco abuse counseling: Secondary | ICD-10-CM | POA: Diagnosis not present

## 2022-12-07 DIAGNOSIS — G8929 Other chronic pain: Secondary | ICD-10-CM | POA: Diagnosis not present

## 2022-12-07 DIAGNOSIS — Z79891 Long term (current) use of opiate analgesic: Secondary | ICD-10-CM | POA: Diagnosis not present

## 2022-12-07 DIAGNOSIS — M4807 Spinal stenosis, lumbosacral region: Secondary | ICD-10-CM | POA: Diagnosis not present

## 2022-12-07 DIAGNOSIS — M545 Low back pain, unspecified: Secondary | ICD-10-CM | POA: Diagnosis not present

## 2022-12-07 DIAGNOSIS — Z720 Tobacco use: Secondary | ICD-10-CM | POA: Diagnosis not present

## 2022-12-07 DIAGNOSIS — F329 Major depressive disorder, single episode, unspecified: Secondary | ICD-10-CM | POA: Diagnosis not present

## 2022-12-13 NOTE — Patient Instructions (Signed)
Cellulitis, Adult  Cellulitis is a skin infection. The infected area is often warm, red, swollen, and sore. It occurs most often in the arms and lower legs. It is very important to get treated for this condition. What are the causes? This condition is caused by bacteria. The bacteria enter through a break in the skin, such as a cut, burn, insect bite, open sore, or crack. What increases the risk? This condition is more likely to occur in people who: Have a weak body defense system (immune system). Have open cuts, burns, bites, or scrapes on the skin. Are older than 67 years of age. Have a blood sugar problem (diabetes). Have a long-lasting (chronic) liver disease (cirrhosis) or kidney disease. Are very overweight (obese). Have a skin problem, such as: Itchy rash (eczema). Slow movement of blood in the veins (venous stasis). Fluid buildup below the skin (edema). Have been treated with high-energy rays (radiation). Use IV drugs. What are the signs or symptoms? Symptoms of this condition include: Skin that is: Red. Streaking. Spotting. Swollen. Sore or painful when you touch it. Warm. A fever. Chills. Blisters. How is this diagnosed? This condition is diagnosed based on: Medical history. Physical exam. Blood tests. Imaging tests. How is this treated? Treatment for this condition may include: Medicines to treat infections or allergies. Home care, such as: Rest. Placing cold or warm cloths (compresses) on the skin. Hospital care, if the condition is very bad. Follow these instructions at home: Medicines Take over-the-counter and prescription medicines only as told by your doctor. If you were prescribed an antibiotic medicine, take it as told by your doctor. Do not stop taking it even if you start to feel better. General instructions  Drink enough fluid to keep your pee (urine) pale yellow. Do not touch or rub the infected area. Raise (elevate) the infected area above  the level of your heart while you are sitting or lying down. Place cold or warm cloths on the area as told by your doctor. Keep all follow-up visits as told by your doctor. This is important. Contact a doctor if: You have a fever. You do not start to get better after 1-2 days of treatment. Your bone or joint under the infected area starts to hurt after the skin has healed. Your infection comes back. This can happen in the same area or another area. You have a swollen bump in the area. You have new symptoms. You feel ill and have muscle aches and pains. Get help right away if: Your symptoms get worse. You feel very sleepy. You throw up (vomit) or have watery poop (diarrhea) for a long time. You see red streaks coming from the area. Your red area gets larger. Your red area turns dark in color. These symptoms may represent a serious problem that is an emergency. Do not wait to see if the symptoms will go away. Get medical help right away. Call your local emergency services (911 in the [place] Do not drive yourself to the hospital. Summary Cellulitis is a skin infection. The area is often warm, red, swollen, and sore. This condition is treated with medicines, rest, and cold and warm cloths. Take all medicines only as told by your doctor. Tell your doctor if symptoms do not start to get better after 1-2 days of treatment. This information is not intended to replace advice given to you by your health care provider. Make sure you discuss any questions you have with your health care provider. Document Revised: 08/27/2021 Document   Reviewed: 08/28/2021 Elsevier Patient Education  2023 [employer] Inc.  

## 2022-12-16 ENCOUNTER — Encounter: Payer: Self-pay | Admitting: Nurse Practitioner

## 2022-12-16 ENCOUNTER — Ambulatory Visit (INDEPENDENT_AMBULATORY_CARE_PROVIDER_SITE_OTHER): Payer: PPO | Admitting: Nurse Practitioner

## 2022-12-16 VITALS — BP 100/58 | HR 76 | Temp 98.2°F | Ht 64.02 in | Wt 190.3 lb

## 2022-12-16 DIAGNOSIS — L03115 Cellulitis of right lower limb: Secondary | ICD-10-CM | POA: Diagnosis not present

## 2022-12-16 DIAGNOSIS — I739 Peripheral vascular disease, unspecified: Secondary | ICD-10-CM | POA: Diagnosis not present

## 2022-12-16 NOTE — Assessment & Plan Note (Signed)
Acute and improved at this time with Doxycyline on board.  Monitor skin closely and notify provider if any open wounds or blisters present.  Compression hose on during day. Suspect some underlying PVD due to her smoking history.

## 2022-12-16 NOTE — Progress Notes (Signed)
BP (!) 100/58   Pulse 76   Temp 98.2 F (36.8 C) (Oral)   Ht 5' 4.02" (1.626 m)   Wt 190 lb 4.8 oz (86.3 kg)   LMP  (LMP Unknown)   SpO2 95%   BMI 32.65 kg/m    Subjective:    Patient ID: [name], female    DOB: 28-Nov-1956, 67 y.o.   MRN: [id number]  HPI: [name] is a 67 y.o. female  Chief Complaint  Patient presents with   Cellulitis    Right Leg Patient states that right leg is better but still swelling some.   SKIN INFECTION Noted on exam 12/02/22 -- treated with Doxycyline 100 MG BID.  She reports overall improvement, no further redness. Duration: weeks Location: right leg History of trauma in area: no Pain: no Quality: no Redness: no Swelling:  mild, improved Oozing: no Pus: no Fevers: no Nausea/vomiting: no Status: better Treatments attempted:antibiotics and warm compresses  Tetanus: UTD   Relevant past medical, surgical, family and social history reviewed and updated as indicated. Interim medical history since our last visit reviewed. Allergies and medications reviewed and updated.  Review of Systems  Constitutional:  Negative for activity change, appetite change, diaphoresis, fatigue and fever.  HENT:  Negative for ear discharge and ear pain.   Respiratory:  Negative for cough, chest tightness, shortness of breath and wheezing.   Cardiovascular:  Positive for leg swelling (baseline). Negative for chest pain and palpitations.  Gastrointestinal: Negative.   Neurological: Negative.   Psychiatric/Behavioral:  Negative for decreased concentration, self-injury, sleep disturbance and suicidal ideas. The patient is not nervous/anxious.    Per HPI unless specifically indicated above     Objective:    BP (!) 100/58   Pulse 76   Temp 98.2 F (36.8 C) (Oral)   Ht 5' 4.02" (1.626 m)   Wt 190 lb 4.8 oz (86.3 kg)   LMP  (LMP Unknown)   SpO2 95%   BMI 32.65 kg/m   Wt Readings from Last 3 Encounters:  12/16/22 190 lb 4.8 oz (86.3 kg)   12/02/22 193 lb (87.5 kg)  09/01/22 206 lb 12.8 oz (93.8 kg)    Physical Exam Vitals and nursing note reviewed.  Constitutional:      General: She is awake. She is not in acute distress.    Appearance: She is well-developed. She is obese. She is not ill-appearing.  HENT:     Head: Normocephalic.     Right Ear: Hearing, tympanic membrane, ear canal and external ear normal.     Left Ear: Hearing, tympanic membrane, ear canal and external ear normal.  Eyes:     General: Lids are normal.        Right eye: No discharge.        Left eye: No discharge.     Conjunctiva/sclera: Conjunctivae normal.     Pupils: Pupils are equal, round, and reactive to light.  Neck:     Vascular: No carotid bruit.  Cardiovascular:     Rate and Rhythm: Normal rate and regular rhythm.     Heart sounds: Normal heart sounds. No murmur heard.    No gallop.     Comments: Right leg with no further erythema or warmth.  Skin intact with no blisters.  No tenderness. 1+ edema bilateral, wearing compression hose.  Decreased hair pattern bilateral.   Pulmonary:     Effort: Pulmonary effort is normal. No accessory muscle usage or respiratory distress.  Breath sounds: Decreased breath sounds and wheezing present.     Comments: Diminished throughout per baseline with occasional expiratory wheezes. Abdominal:     General: Bowel sounds are normal.     Palpations: Abdomen is soft.  Musculoskeletal:     Cervical back: Normal range of motion and neck supple.     Right lower leg: 1+ Edema present.     Left lower leg: 1+ Edema present.  Skin:    General: Skin is warm and dry.  Neurological:     Mental Status: She is alert and oriented to person, place, and time.  Psychiatric:        Attention and Perception: Attention normal.        Mood and Affect: Mood normal.        Speech: Speech normal.        Behavior: Behavior normal. Behavior is cooperative.        Thought Content: Thought content normal.     Results for  orders placed or performed in visit on 12/02/22  CBC with Differential/Platelet  Result Value Ref Range   WBC 12.9 (H) 3.4 - 10.8 x10E3/uL   RBC 4.66 3.77 - 5.28 x10E6/uL   Hemoglobin 13.5 11.1 - 15.9 g/dL   Hematocrit 41.4 34.0 - 46.6 %   MCV 89 79 - 97 fL   MCH 29.0 26.6 - 33.0 pg   MCHC 32.6 31.5 - 35.7 g/dL   RDW 13.6 11.7 - 15.4 %   Platelets 321 150 - 450 x10E3/uL   Neutrophils 53 Not Estab. %   Lymphs 34 Not Estab. %   Monocytes 10 Not Estab. %   Eos 2 Not Estab. %   Basos 1 Not Estab. %   Neutrophils Absolute 6.8 1.4 - 7.0 x10E3/uL   Lymphocytes Absolute 4.3 (H) 0.7 - 3.1 x10E3/uL   Monocytes Absolute 1.3 (H) 0.1 - 0.9 x10E3/uL   EOS (ABSOLUTE) 0.3 0.0 - 0.4 x10E3/uL   Basophils Absolute 0.1 0.0 - 0.2 x10E3/uL   Immature Granulocytes 0 Not Estab. %   Immature Grans (Abs) 0.0 0.0 - 0.1 x10E3/uL  Comprehensive metabolic panel  Result Value Ref Range   Glucose 94 70 - 99 mg/dL   BUN 8 8 - 27 mg/dL   Creatinine, Ser 0.89 0.57 - 1.00 mg/dL   eGFR 71 >59 mL/min/1.73   BUN/Creatinine Ratio 9 (L) 12 - 28   Sodium 142 134 - 144 mmol/L   Potassium 4.3 3.5 - 5.2 mmol/L   Chloride 102 96 - 106 mmol/L   CO2 25 20 - 29 mmol/L   Calcium 9.5 8.7 - 10.3 mg/dL   Total Protein 6.4 6.0 - 8.5 g/dL   Albumin 3.3 (L) 3.9 - 4.9 g/dL   Globulin, Total 3.1 1.5 - 4.5 g/dL   Albumin/Globulin Ratio 1.1 (L) 1.2 - 2.2   Bilirubin Total 0.2 0.0 - 1.2 mg/dL   Alkaline Phosphatase 189 (H) 44 - 121 IU/L   AST 25 0 - 40 IU/L   ALT 11 0 - 32 IU/L  Lipid Panel w/o Chol/HDL Ratio  Result Value Ref Range   Cholesterol, Total 158 100 - 199 mg/dL   Triglycerides 206 (H) 0 - 149 mg/dL   HDL 39 (L) >39 mg/dL   VLDL Cholesterol Cal 35 5 - 40 mg/dL   LDL Chol Calc (NIH) 84 0 - 99 mg/dL  HgB A1c  Result Value Ref Range   Hgb A1c MFr Bld 5.3 4.8 - 5.6 %   Est.  average glucose Bld gHb Est-mCnc 105 mg/dL  Microalbumin, Urine Waived  Result Value Ref Range   Microalb, Ur Waived 30 (H) 0 - 19 mg/L    Creatinine, Urine Waived 10 10 - 300 mg/dL   Microalb/Creat Ratio 30-300 (H) <30 mg/g  Magnesium  Result Value Ref Range   Magnesium 2.0 1.6 - 2.3 mg/dL  VITAMIN D 25 Hydroxy (Vit-D Deficiency, Fractures)  Result Value Ref Range   Vit D, 25-Hydroxy 55.9 30.0 - 100.0 ng/mL      Assessment & Plan:   Problem List Items Addressed This Visit       Cardiovascular and Mediastinum   PVD (peripheral vascular disease) (HCC)    Chronic, evidenced by decreased hair pattern and baseline edema.  Is long time smoker, recommend complete cessation.  Consider vascular visit in future.  At this time monitor skin closely for wounds and wear compression hose daily.        Other   Cellulitis of right lower leg - Primary    Acute and improved at this time with Doxycyline on board.  Monitor skin closely and notify provider if any open wounds or blisters present.  Compression hose on during day. Suspect some underlying PVD due to her smoking history.        Follow up plan: Return in about 3 months (around 03/17/2023) for PREDIABETES, HTN/HLD, COPD, CHRONIC PAIN.

## 2022-12-16 NOTE — Assessment & Plan Note (Signed)
Chronic, evidenced by decreased hair pattern and baseline edema.  Is long time smoker, recommend complete cessation.  Consider vascular visit in future.  At this time monitor skin closely for wounds and wear compression hose daily.

## 2022-12-18 ENCOUNTER — Telehealth: Payer: Self-pay

## 2022-12-18 NOTE — Telephone Encounter (Signed)
Paperwork faxed back to [employer]

## 2022-12-23 ENCOUNTER — Other Ambulatory Visit: Payer: Self-pay | Admitting: Nurse Practitioner

## 2022-12-24 NOTE — Telephone Encounter (Signed)
Requested medication (s) are due for refill today: see below  Requested medication (s) are on the active medication list: yes  Last refill:  07/07/22 #73m/5  Future visit scheduled: yes  Notes to clinic:   Alternative Requested:NOT COVERED BY INSURANCE. Requesting Ozempic      Requested Prescriptions  Pending Prescriptions Disp Refills   OZEMPIC, 2 MG/DOSE, 8 MG/3ML SOPN [Pharmacy Med Name: OZEMPIC 8 MG/3 ML (2 MG/DOSE)]  5    Sig: INJECT 2 MG AS DIRECTED ONCE A WEEK.     Endocrinology:  Diabetes - GLP-1 Receptor Agonists - semaglutide Passed - 12/23/2022  6:53 PM      Passed - HBA1C in normal range and within 180 days    HB A1C (BAYER DCA - WAIVED)  Date Value Ref Range Status  12/10/2021 5.6 4.8 - 5.6 % Final    Comment:             Prediabetes: 5.7 - 6.4          Diabetes: >6.4          Glycemic control for adults with diabetes: <7.0    Hgb A1c MFr Bld  Date Value Ref Range Status  12/02/2022 5.3 4.8 - 5.6 % Final    Comment:             Prediabetes: 5.7 - 6.4          Diabetes: >6.4          Glycemic control for adults with diabetes: <7.0          Passed - Cr in normal range and within 360 days    Creatinine  Date Value Ref Range Status  03/23/2014 0.99 0.60 - 1.30 mg/dL Final   Creatinine, Ser  Date Value Ref Range Status  12/02/2022 0.89 0.57 - 1.00 mg/dL Final         Passed - Valid encounter within last 6 months    Recent Outpatient Visits           1 week ago Cellulitis of right lower leg   [place] Scre[place] [place]   3 weeks ago Centrilobular emphysema ([place]   [place] Scre[place]   3 months ago Morbid obesity ([place]   [place] J[place]han[place], [place]   5 months ago Depression, major, single episode, in partial remission ([place]   [place] J[place], NP   7 months ago Atrial fibrillation with RVR ([place]   [place] J[place]baraann Faster NP       Future Appointments             In 2 months Cannady, JBarbaraann Faster NP [facility] [place] [facility]

## 2022-12-29 ENCOUNTER — Telehealth: Payer: Self-pay

## 2022-12-29 NOTE — Telephone Encounter (Signed)
PA started for Ozempic '2mg'$ /29m through [employer]. Awaiting on determination

## 2022-12-30 ENCOUNTER — Other Ambulatory Visit: Payer: Self-pay | Admitting: Nurse Practitioner

## 2022-12-30 NOTE — Telephone Encounter (Signed)
Requested Prescriptions  Pending Prescriptions Disp Refills   furosemide (LASIX) 20 MG tablet [Pharmacy Med Name: FUROSEMIDE 20 MG TABLET] 90 tablet 0    Sig: TAKE 1 TABLET BY MOUTH EVERY DAY     Cardiovascular:  Diuretics - Loop Passed - 12/30/2022  4:25 AM      Passed - K in normal range and within 180 days    Potassium  Date Value Ref Range Status  12/02/2022 4.3 3.5 - 5.2 mmol/L Final  03/23/2014 3.6 3.5 - 5.1 mmol/L Final         Passed - Ca in normal range and within 180 days    Calcium  Date Value Ref Range Status  12/02/2022 9.5 8.7 - 10.3 mg/dL Final   Calcium, Total  Date Value Ref Range Status  03/23/2014 9.3 8.5 - 10.1 mg/dL Final         Passed - Na in normal range and within 180 days    Sodium  Date Value Ref Range Status  12/02/2022 142 134 - 144 mmol/L Final  03/23/2014 142 136 - 145 mmol/L Final         Passed - Cr in normal range and within 180 days    Creatinine  Date Value Ref Range Status  03/23/2014 0.99 0.60 - 1.30 mg/dL Final   Creatinine, Ser  Date Value Ref Range Status  12/02/2022 0.89 0.57 - 1.00 mg/dL Final         Passed - Cl in normal range and within 180 days    Chloride  Date Value Ref Range Status  12/02/2022 102 96 - 106 mmol/L Final  03/23/2014 108 (H) 98 - 107 mmol/L Final         Passed - Mg Level in normal range and within 180 days    Magnesium  Date Value Ref Range Status  12/02/2022 2.0 1.6 - 2.3 mg/dL Final         Passed - Last BP in normal range    BP Readings from Last 1 Encounters:  12/16/22 (!) 100/58         Passed - Valid encounter within last 6 months    Recent Outpatient Visits           2 weeks ago Cellulitis of right lower leg   [place], [place] T, NP   4 weeks ago Centrilobular emphysema ([place])   [place], [place] T, NP   4 months ago Morbid obesity ([place])   [place], [place] T, NP   5 months  ago Depression, major, single episode, in partial remission ([place])   [place], Henrine Screws T, NP   7 months ago Atrial fibrillation with RVR ([place])   [place], Barbaraann Faster, NP       Future Appointments             In 2 months Cannady, Barbaraann Faster, NP [place], [facility]

## 2023-01-05 NOTE — Telephone Encounter (Signed)
This PA has been denied on 1/18, Insurance will not pay if used for weight loss or prediabetes.

## 2023-01-06 DIAGNOSIS — F419 Anxiety disorder, unspecified: Secondary | ICD-10-CM | POA: Diagnosis not present

## 2023-01-06 DIAGNOSIS — F32A Depression, unspecified: Secondary | ICD-10-CM | POA: Diagnosis not present

## 2023-01-06 DIAGNOSIS — I4891 Unspecified atrial fibrillation: Secondary | ICD-10-CM | POA: Diagnosis not present

## 2023-01-06 DIAGNOSIS — Z716 Tobacco abuse counseling: Secondary | ICD-10-CM | POA: Diagnosis not present

## 2023-01-06 DIAGNOSIS — Z79891 Long term (current) use of opiate analgesic: Secondary | ICD-10-CM | POA: Diagnosis not present

## 2023-01-06 DIAGNOSIS — G8929 Other chronic pain: Secondary | ICD-10-CM | POA: Diagnosis not present

## 2023-01-06 DIAGNOSIS — M545 Low back pain, unspecified: Secondary | ICD-10-CM | POA: Diagnosis not present

## 2023-01-06 DIAGNOSIS — F1120 Opioid dependence, uncomplicated: Secondary | ICD-10-CM | POA: Diagnosis not present

## 2023-01-06 DIAGNOSIS — M48061 Spinal stenosis, lumbar region without neurogenic claudication: Secondary | ICD-10-CM | POA: Diagnosis not present

## 2023-01-06 DIAGNOSIS — I2510 Atherosclerotic heart disease of native coronary artery without angina pectoris: Secondary | ICD-10-CM | POA: Diagnosis not present

## 2023-01-06 DIAGNOSIS — Z720 Tobacco use: Secondary | ICD-10-CM | POA: Diagnosis not present

## 2023-01-06 DIAGNOSIS — M47816 Spondylosis without myelopathy or radiculopathy, lumbar region: Secondary | ICD-10-CM | POA: Diagnosis not present

## 2023-01-12 ENCOUNTER — Ambulatory Visit (INDEPENDENT_AMBULATORY_CARE_PROVIDER_SITE_OTHER): Payer: PPO

## 2023-01-12 VITALS — Wt 190.0 lb

## 2023-01-12 DIAGNOSIS — Z0000 Encounter for general adult medical examination without abnormal findings: Secondary | ICD-10-CM

## 2023-01-12 NOTE — Progress Notes (Signed)
I connected with  AVIENDHA HIRANO on 01/12/23 by a audio enabled telemedicine application and verified that I am speaking with the correct person using two identifiers.  Patient Location: Home  Provider Location: Home Office  I discussed the limitations of evaluation and management by telemedicine. The patient expressed understanding and agreed to proceed.  Subjective:   [name] is a 67 y.o. female who presents for Medicare Annual (Subsequent) preventive examination.  Review of Systems     Cardiac Risk Factors include: advanced age (>24mn, >>73women);sedentary lifestyle;hypertension     Objective:    Today's Vitals   01/12/23 1531  PainSc: 3    There is no height or weight on file to calculate BMI.     01/12/2023    3:38 PM 04/14/2022    9:19 PM 04/14/2022    2:04 PM 12/11/2021   12:08 PM 05/19/2021   10:22 AM 10/29/2020    3:12 PM 01/05/2018   10:54 AM  Advanced Directives  Does Patient Have a Medical Advance Directive? No No No No No No No  Would patient like information on creating a medical advance directive? No - Patient declined No - Patient declined  No - Patient declined No - Patient declined  No - Patient declined    Current Medications (verified) Outpatient Encounter Medications as of 01/12/2023  Medication Sig   acetaminophen (TYLENOL) 500 MG tablet Take 1,000 mg by mouth in the morning and at bedtime.   albuterol (PROAIR HFA) 108 (90 Base) MCG/ACT inhaler Inhale 2 puffs into the lungs every 6 (six) hours as needed for wheezing or shortness of breath.   apixaban (ELIQUIS) 5 MG TABS tablet Take 1 tablet (5 mg total) by mouth 2 (two) times daily.   ascorbic acid (VITAMIN C) 500 MG tablet Take 500 mg by mouth daily.   atorvastatin (LIPITOR) 80 MG tablet Take 1 tablet (80 mg total) by mouth daily.   baclofen (LIORESAL) 10 MG tablet Take 0.5 tablets (5 mg total) by mouth 3 (three) times daily.   Buprenorphine HCl (BELBUCA) 300 MCG FILM Place 1 Film inside cheek  every 8 (eight) hours.   busPIRone (BUSPAR) 10 MG tablet Take 2 tablets (20 mg total) by mouth 3 (three) times daily.   Calcium Citrate-Vitamin D (CALCIUM + D PO) Take 1,000 mg by mouth daily.   Cholecalciferol (VITAMIN D) 50 MCG (2000 UT) CAPS Take by mouth.   furosemide (LASIX) 20 MG tablet TAKE 1 TABLET BY MOUTH EVERY DAY   gabapentin (NEURONTIN) 800 MG tablet Take 1 tablet (800 mg total) by mouth in the morning and at bedtime.   hydrOXYzine (ATARAX) 10 MG tablet Take 1 tablet (10 mg total) by mouth 3 (three) times daily as needed.   Melatonin 10 MG TABS Take 1 tablet by mouth at bedtime.   Multiple Vitamins-Minerals (MULTIVITAMIN WITH MINERALS) tablet Take 1 tablet by mouth daily.   omeprazole (PRILOSEC) 40 MG capsule Take 1 capsule (40 mg total) by mouth daily.   potassium chloride SA (KLOR-CON M) 20 MEQ tablet Take 1 tablet (20 mEq total) by mouth daily.   Semaglutide, 2 MG/DOSE, 8 MG/3ML SOPN Inject 2 mg as directed once a week.   sotalol (BETAPACE) 80 MG tablet Take 80 mg by mouth 2 (two) times daily.   Tiotropium Bromide Monohydrate (SPIRIVA RESPIMAT) 2.5 MCG/ACT AERS INHALE 2 SPRAY(S) BY MOUTH ONCE DAILY   venlafaxine XR (EFFEXOR-XR) 150 MG 24 hr capsule Take 1 capsule (150 mg total) by mouth  daily with breakfast.   vitamin B-12 (CYANOCOBALAMIN) 1000 MCG tablet Take 1,000 mcg by mouth daily.   No facility-administered encounter medications on file as of 01/12/2023.    Allergies (verified) Iodinated contrast media   History: Past Medical History:  Diagnosis Date   Arthritis    COPD (chronic obstructive pulmonary disease) (HCC)    Enlarged heart    GERD (gastroesophageal reflux disease)    Hiatal hernia    Hx of degenerative disc disease    Hyperlipidemia    Hypertension    Mass    On adrenal gland   Monocytosis    Osteoporosis    Psoriasis    Sleep apnea    Past Surgical History:  Procedure Laterality Date   ABDOMINAL HYSTERECTOMY     ACHILLES TENDON REPAIR      Removed bone spur and repaired achilles tendon   APPENDECTOMY     bone fusion in neck     COLONOSCOPY WITH PROPOFOL N/A 01/17/2019   Procedure: COLONOSCOPY WITH PROPOFOL;  Surgeon: Jonathon Bellows, MD;  Location: [facility] ENDOSCOPY;  Service: Gastroenterology;  Laterality: N/A;   HERNIA REPAIR     has not had repair   TONSILLECTOMY     Family History  Problem Relation Age of Onset   COPD Father    Heart disease Father    Hyperlipidemia Father    Hypertension Father    Dementia Father    Cancer Sister    Hyperlipidemia Sister    Hypertension Sister    Lymphoma Sister    Migraines Son    Stroke Maternal Grandfather    Heart attack Brother    Cancer Brother        colorectal and liver   Breast cancer Paternal Aunt    Breast cancer Paternal Aunt    Social History   Socioeconomic History   Marital status: Widowed    Spouse name: Not on file   Number of children: Not on file   Years of education: Not on file   Highest education level: Not on file  Occupational History   Occupation: disabled  Tobacco Use   Smoking status: Every Day    Packs/day: 1.00    Years: 48.00    Total pack years: 48.00    Types: Cigarettes   Smokeless tobacco: Never   Tobacco comments:    pt states she plans to begin using patch to quit smoking  Vaping Use   Vaping Use: Never used  Substance and Sexual Activity   Alcohol use: No    Alcohol/week: 0.0 standard drinks of alcohol   Drug use: No   Sexual activity: Never    Partners: Male  Other Topics Concern   Not on file  Social History Narrative   Not on file   Social Determinants of Health   Financial Resource Strain: Low Risk  (01/12/2023)   Overall Financial Resource Strain (CARDIA)    Difficulty of Paying Living Expenses: Not hard at all  Food Insecurity: No Food Insecurity (01/12/2023)   Hunger Vital Sign    Worried About Running Out of Food in the Last Year: Never true    Ran Out of Food in the Last Year: Never true  Transportation Needs:  No Transportation Needs (01/12/2023)   PRAPARE - Hydrologist (Medical): No    Lack of Transportation (Non-Medical): No  Physical Activity: Inactive (01/12/2023)   Exercise Vital Sign    Days of Exercise per Week: 0 days  Minutes of Exercise per Session: 0 min  Stress: No Stress Concern Present (01/12/2023)   [place]    Feeling of Stress : Only a little  Social Connections: Socially Isolated (01/12/2023)   Social Connection and Isolation Panel [NHANES]    Frequency of Communication with Friends and Family: More than three times a week    Frequency of Social Gatherings with Friends and Family: More than three times a week    Attends Religious Services: Never    Marine scientist or Organizations: No    Attends Archivist Meetings: Never    Marital Status: Widowed    Tobacco Counseling Ready to quit: Not Answered Counseling given: Not Answered Tobacco comments: pt states she plans to begin using patch to quit smoking   Clinical Intake:  Pre-visit preparation completed: Yes  Pain : 0-10 Pain Score: 3  Pain Type: Chronic pain Pain Location: Back Pain Relieving Factors: Dr.Kahn has her on meds  Pain Relieving Factors: Dr.Kahn has her on meds  Nutritional Risks: None Diabetes: No  How often do you need to have someone help you when you read instructions, pamphlets, or other written materials from your doctor or pharmacy?: 1 - Never  Diabetic?no  Interpreter Needed?: No  Information entered by :: Kirke Shaggy, LPN   Activities of Daily Living    01/12/2023    3:39 PM 04/15/2022    3:57 PM  In your present state of health, do you have any difficulty performing the following activities:  Hearing? 0 0  Vision? 0 0  Difficulty concentrating or making decisions? 0 0  Walking or climbing stairs? 1 1  Dressing or bathing? 0 0  Doing errands, shopping? 1    Preparing Food and eating ? N   Using the Toilet? N   In the past six months, have you accidently leaked urine? N   Do you have problems with loss of bowel control? N   Managing your Medications? N   Managing your Finances? N   Housekeeping or managing your Housekeeping? Y     Patient Care Team: Venita Lick, NP as PCP - General (Nurse Practitioner) Sindy Guadeloupe, MD as Consulting Physician (Oncology)  Indicate any recent Medical Services you may have received from other than [facility] providers in the past year (date may be approximate).     Assessment:   This is a routine wellness examination for [name].  Hearing/Vision screen Hearing Screening - Comments:: No aids Vision Screening - Comments:: Wears glasses-   Dietary issues and exercise activities discussed: Current Exercise Habits: The patient does not participate in regular exercise at present   Goals Addressed             This Visit's Progress    DIET - EAT MORE FRUITS AND VEGETABLES         Depression Screen    01/12/2023    3:36 PM 12/02/2022    2:57 PM 07/07/2022    3:32 PM 05/25/2022    3:11 PM 04/21/2022    7:09 PM 03/10/2022    2:11 PM 12/11/2021   12:08 PM  PHQ 2/9 Scores  PHQ - 2 Score 0 0 0 0 2 0 0  PHQ- 9 Score 0 0 0 0 5 0 0    Fall Risk    01/12/2023    3:39 PM 12/02/2022    2:57 PM 07/07/2022    3:32 PM 05/25/2022  3:11 PM 03/10/2022    2:11 PM  [place] in the past year? 0 0 0 0 0  Number falls in past yr: 0 0 0 0 0  Injury with Fall? 0 0 0 0 0  Risk for fall due to : No Fall Risks Impaired balance/gait No Fall Risks No Fall Risks No Fall Risks  Follow up Falls prevention discussed;Falls evaluation completed Falls evaluation completed Falls evaluation completed Falls evaluation completed Falls evaluation completed    FALL RISK PREVENTION PERTAINING TO THE HOME:  Any stairs in or around the home? Yes  If so, are there any without handrails? No  Home free of loose throw rugs in  walkways, pet beds, electrical cords, etc? Yes  Adequate lighting in your home to reduce risk of falls? Yes   ASSISTIVE DEVICES UTILIZED TO PREVENT FALLS:  Life alert? Yes  Use of a cane, walker or w/c? Yes  Grab bars in the bathroom? No  Shower chair or bench in shower? Yes  Elevated toilet seat or a handicapped toilet? Yes   Cognitive Function:        01/12/2023    3:41 PM  6CIT Screen  What Year? 0 points  What month? 0 points  What time? 0 points  Count back from 20 0 points  Months in reverse 0 points  Repeat phrase 2 points  Total Score 2 points    Immunizations Immunization History  Administered Date(s) Administered   Fluad Quad(high Dose 65+) 09/03/2021, 12/02/2022   Influenza,inj,Quad PF,6+ Mos 10/28/2015, 08/25/2016, 09/07/2017, 09/28/2018, 09/04/2019, 09/03/2020   Influenza-Unspecified 08/27/2014   Moderna Sars-Covid-2 Vaccination 06/28/2020, 07/26/2020   Pneumococcal Conjugate-13 03/04/2021   Pneumococcal Polysaccharide-23 02/24/2019   Td 08/27/2014   Zoster, Live 12/20/2015    TDAP status: Up to date  Flu Vaccine status: Up to date  Pneumococcal vaccine status: Up to date  Covid-19 vaccine status: Completed vaccines  Qualifies for Shingles Vaccine? Yes   Zostavax completed Yes   Shingrix Completed?: No.    Education has been provided regarding the importance of this vaccine. Patient has been advised to call insurance company to determine out of pocket expense if they have not yet received this vaccine. Advised may also receive vaccine at local pharmacy or Health Dept. Verbalized acceptance and understanding.  Screening Tests Health Maintenance  Topic Date Due   COVID-19 Vaccine (3 - Moderna risk series) 08/23/2020   Zoster Vaccines- Shingrix (1 of 2) 03/03/2023 (Originally 12/15/1974)   Lung Cancer Screening  10/22/2023   Medicare Annual Wellness (AWV)  01/13/2024   COLONOSCOPY (Pts 45-62yr Insurance coverage will need to be confirmed)   01/18/2024   MAMMOGRAM  01/27/2024   Pneumonia Vaccine 67 Years old (3 of 3 - PPSV23 or PCV20) 02/24/2024   DTaP/Tdap/Td (2 - Tdap) 08/27/2024   INFLUENZA VACCINE  Completed   DEXA SCAN  Completed   Hepatitis C Screening  Completed   HPV VACCINES  Aged Out    Health Maintenance  Health Maintenance Due  Topic Date Due   COVID-19 Vaccine (3 - Moderna risk series) 08/23/2020    Colorectal cancer screening: Type of screening: Colonoscopy. Completed 01/17/19. Repeat every 5 years  Mammogram status: Completed 01/26/22. Repeat every year- will schedule for another one this year  Bone Density status: Completed 01/26/22. Results reflect: Bone density results: OSTEOPENIA. Repeat every 5 years.  Lung Cancer Screening: (Low Dose CT Chest recommended if Age 67-80years, 30 pack-year currently smoking OR have quit w/in  15years.) does qualify.   Lung Cancer Screening Referral: scheduled 01/21/23  Additional Screening:  Hepatitis C Screening: does qualify; Completed 07/26/15  Vision Screening: Recommended annual ophthalmology exams for early detection of glaucoma and other disorders of the eye. Is the patient up to date with their annual eye exam?  No  Who is the provider or what is the name of the office in which the patient attends annual eye exams? No one If pt is not established with a provider, would they like to be referred to a provider to establish care? No .   Dental Screening: Recommended annual dental exams for proper oral hygiene  Community Resource Referral / Chronic Care Management: CRR required this visit?  No   CCM required this visit?  No      Plan:     I have personally reviewed and noted the following in the patient's chart:   Medical and social history Use of alcohol, tobacco or illicit drugs  Current medications and supplements including opioid prescriptions. Patient is not currently taking opioid prescriptions. Functional ability and status Nutritional  status Physical activity Advanced directives List of other physicians Hospitalizations, surgeries, and ER visits in previous 12 months Vitals Screenings to include cognitive, depression, and falls Referrals and appointments  In addition, I have reviewed and discussed with patient certain preventive protocols, quality metrics, and best practice recommendations. A written personalized care plan for preventive services as well as general preventive health recommendations were provided to patient.     Dionisio David, LPN   [id number]   Nurse Notes: wants to talk to MD about left foot, has sore that won't heal (advised to make appt)

## 2023-01-12 NOTE — Patient Instructions (Signed)
[name] , Thank you for taking time to come for your Medicare Wellness Visit. I appreciate your ongoing commitment to your health goals. Please review the following plan we discussed and let me know if I can assist you in the future.   These are the goals we discussed:  Goals       DIET - EAT MORE FRUITS AND VEGETABLES      PharmD "I am in a lot of pain" (pt-stated)      CARE PLAN ENTRY (see longtitudinal plan of care for additional care plan information)  Current Barriers:  Polypharmacy; complex patient with multiple comorbidities including pre-diabetes, chronic pain, depression, COPD, tobacco abuse Financial concerns - collaborated w/ Care Guide. Patient over income for Medicaid. Biggest concern is inability to afford Pain Management provider.  Chronic pain - Current medication regimen: duloxetine 60 mg daily, gabapentin 800 mg TID, tizanidine 2 mg TID - reports she is taking TID regularly, and that this has helped significantly with her pain. Has stopped Goody powders since our last call.  Stopped naproxen d/t lack of benefit, notes that ibuprofen impacted kidney function previously Denies any benefit from lidoderm patches or Voltaren gel before  Depression/anxiety: duloxetine 60 mg daily; buspirone 10 mg TID, hydroxyzine 10 mg TID; reports she had 2 panic attacks last week;  T2DM: last A1c 5.6%, metformin 500 mg TID ASCVD risk reduction: ASA 81 mg daily; atorvastatin 40 mg daily; last LDL at goal <100 Acid reflux: lansoprazole 15 mg daily HTN: telmisartan/HCTZ 40/12.5 mg QAM; furosemide 40 mg daily; BP at home: reports that it is "generally well controlled", but does not remember her recent readings. Notes that she needs to start writing down her readings.  COPD: Spiriva Handihaler 2.5 mcg; 2 puffs daily; hasn't needed rescue albuterol recently.  Pharmacist Clinical Goal(s):  Over the next 90 days, patient will work with PharmD and provider towards optimized medication  management  Interventions: Comprehensive medication review performed, medication list updated in electronic medical record Inter-disciplinary care team collaboration (see longitudinal plan of care) Reviewed goal A1c, goal BP. Praised patient for continued control.  Reviewed recent collaboration w/ Care Guide for financial resources. Reviewed upcoming call w/ LCSW for mental health and financial support. Will collaborate w/ LCSW for any options to help pursue pain management in an affordable way  Patient Self Care Activities:  Patient will take medications as prescribed  Please see past updates related to this goal by clicking on the "Past Updates" button in the selected goal        Quit smoking / using tobacco      Weight (lb) < 200 lb (90.7 kg)        This is a list of the screening recommended for you and due dates:  Health Maintenance  Topic Date Due   COVID-19 Vaccine (3 - Moderna risk series) 08/23/2020   Zoster (Shingles) Vaccine (1 of 2) 03/03/2023*   Screening for Lung Cancer  10/22/2023   Medicare Annual Wellness Visit  01/13/2024   Colon Cancer Screening  01/18/2024   Mammogram  01/27/2024   Pneumonia Vaccine (3 of 3 - PPSV23 or PCV20) 02/24/2024   DTaP/Tdap/Td vaccine (2 - Tdap) 08/27/2024   Flu Shot  Completed   DEXA scan (bone density measurement)  Completed   Hepatitis C Screening: USPSTF Recommendation to screen - Ages 66-79 yo.  Completed   HPV Vaccine  Aged Out  *Topic was postponed. The date shown is not the original due date.  Advanced directives: no  Conditions/risks identified: none  Next appointment: Follow up in one year for your annual wellness visit 01/18/24 @ 11:30 am by phone   Preventive Care 65 Years and Older, Female Preventive care refers to lifestyle choices and visits with your health care provider that can promote health and wellness. What does preventive care include? A yearly physical exam. This is also called an annual well  check. Dental exams once or twice a year. Routine eye exams. Ask your health care provider how often you should have your eyes checked. Personal lifestyle choices, including: Daily care of your teeth and gums. Regular physical activity. Eating a healthy diet. Avoiding tobacco and drug use. Limiting alcohol use. Practicing safe sex. Taking low-dose aspirin every day. Taking vitamin and mineral supplements as recommended by your health care provider. What happens during an annual well check? The services and screenings done by your health care provider during your annual well check will depend on your age, overall health, lifestyle risk factors, and family history of disease. Counseling  Your health care provider may ask you questions about your: Alcohol use. Tobacco use. Drug use. Emotional well-being. Home and relationship well-being. Sexual activity. Eating habits. History of falls. Memory and ability to understand (cognition). Work and work Statistician. Reproductive health. Screening  You may have the following tests or measurements: Height, weight, and BMI. Blood pressure. Lipid and cholesterol levels. These may be checked every 5 years, or more frequently if you are over 53 years old. Skin check. Lung cancer screening. You may have this screening every year starting at age 3 if you have a 30-pack-year history of smoking and currently smoke or have quit within the past 15 years. Fecal occult blood test (FOBT) of the stool. You may have this test every year starting at age 70. Flexible sigmoidoscopy or colonoscopy. You may have a sigmoidoscopy every 5 years or a colonoscopy every 10 years starting at age 53. Hepatitis C blood test. Hepatitis B blood test. Sexually transmitted disease (STD) testing. Diabetes screening. This is done by checking your blood sugar (glucose) after you have not eaten for a while (fasting). You may have this done every 1-3 years. Bone density scan.  This is done to screen for osteoporosis. You may have this done starting at age 5. Mammogram. This may be done every 1-2 years. Talk to your health care provider about how often you should have regular mammograms. Talk with your health care provider about your test results, treatment options, and if necessary, the need for more tests. Vaccines  Your health care provider may recommend certain vaccines, such as: Influenza vaccine. This is recommended every year. Tetanus, diphtheria, and acellular pertussis (Tdap, Td) vaccine. You may need a Td booster every 10 years. Zoster vaccine. You may need this after age 1. Pneumococcal 13-valent conjugate (PCV13) vaccine. One dose is recommended after age 28. Pneumococcal polysaccharide (PPSV23) vaccine. One dose is recommended after age 36. Talk to your health care provider about which screenings and vaccines you need and how often you need them. This information is not intended to replace advice given to you by your health care provider. Make sure you discuss any questions you have with your health care provider. Document Released: 12/13/2015 Document Revised: 08/05/2016 Document Reviewed: 09/17/2015 Elsevier Interactive Patient Education  2017 [place] Prevention in the Home Falls can cause injuries. They can happen to people of all ages. There are many things you can do to make your home safe  and to help prevent falls. What can I do on the outside of my home? Regularly fix the edges of walkways and driveways and fix any cracks. Remove anything that might make you trip as you walk through a door, such as a raised step or threshold. Trim any bushes or trees on the path to your home. Use bright outdoor lighting. Clear any walking paths of anything that might make someone trip, such as rocks or tools. Regularly check to see if handrails are loose or broken. Make sure that both sides of any steps have handrails. Any raised decks and porches  should have guardrails on the edges. Have any leaves, snow, or ice cleared regularly. Use sand or salt on walking paths during winter. Clean up any spills in your garage right away. This includes oil or grease spills. What can I do in the bathroom? Use night lights. Install grab bars by the toilet and in the tub and shower. Do not use towel bars as grab bars. Use non-skid mats or decals in the tub or shower. If you need to sit down in the shower, use a plastic, non-slip stool. Keep the floor dry. Clean up any water that spills on the floor as soon as it happens. Remove soap buildup in the tub or shower regularly. Attach bath mats securely with double-sided non-slip rug tape. Do not have throw rugs and other things on the floor that can make you trip. What can I do in the bedroom? Use night lights. Make sure that you have a light by your bed that is easy to reach. Do not use any sheets or blankets that are too big for your bed. They should not hang down onto the floor. Have a firm chair that has side arms. You can use this for support while you get dressed. Do not have throw rugs and other things on the floor that can make you trip. What can I do in the kitchen? Clean up any spills right away. Avoid walking on wet floors. Keep items that you use a lot in easy-to-reach places. If you need to reach something above you, use a strong step stool that has a grab bar. Keep electrical cords out of the way. Do not use floor polish or wax that makes floors slippery. If you must use wax, use non-skid floor wax. Do not have throw rugs and other things on the floor that can make you trip. What can I do with my stairs? Do not leave any items on the stairs. Make sure that there are handrails on both sides of the stairs and use them. Fix handrails that are broken or loose. Make sure that handrails are as long as the stairways. Check any carpeting to make sure that it is firmly attached to the stairs.  Fix any carpet that is loose or worn. Avoid having throw rugs at the top or bottom of the stairs. If you do have throw rugs, attach them to the floor with carpet tape. Make sure that you have a light switch at the top of the stairs and the bottom of the stairs. If you do not have them, ask someone to add them for you. What else can I do to help prevent falls? Wear shoes that: Do not have high heels. Have rubber bottoms. Are comfortable and fit you well. Are closed at the toe. Do not wear sandals. If you use a stepladder: Make sure that it is fully opened. Do not climb a closed stepladder.  Make sure that both sides of the stepladder are locked into place. Ask someone to hold it for you, if possible. Clearly mark and make sure that you can see: Any grab bars or handrails. First and last steps. Where the edge of each step is. Use tools that help you move around (mobility aids) if they are needed. These include: Canes. Walkers. Scooters. Crutches. Turn on the lights when you go into a dark area. Replace any light bulbs as soon as they burn out. Set up your furniture so you have a clear path. Avoid moving your furniture around. If any of your floors are uneven, fix them. If there are any pets around you, be aware of where they are. Review your medicines with your doctor. Some medicines can make you feel dizzy. This can increase your chance of falling. Ask your doctor what other things that you can do to help prevent falls. This information is not intended to replace advice given to you by your health care provider. Make sure you discuss any questions you have with your health care provider. Document Released: 09/12/2009 Document Revised: 04/23/2016 Document Reviewed: 12/21/2014 Elsevier Interactive Patient Education  2017 [employer].

## 2023-01-13 ENCOUNTER — Encounter: Payer: Self-pay | Admitting: Nurse Practitioner

## 2023-01-18 ENCOUNTER — Encounter: Payer: Self-pay | Admitting: Nurse Practitioner

## 2023-01-21 ENCOUNTER — Ambulatory Visit
Admission: RE | Admit: 2023-01-21 | Discharge: 2023-01-21 | Disposition: A | Discharge status: Home / Self Care | Payer: PPO | Source: Ambulatory Visit | Attending: Acute Care | Admitting: Acute Care

## 2023-01-21 DIAGNOSIS — J439 Emphysema, unspecified: Secondary | ICD-10-CM | POA: Diagnosis not present

## 2023-01-21 DIAGNOSIS — Z87891 Personal history of nicotine dependence: Secondary | ICD-10-CM | POA: Diagnosis not present

## 2023-01-21 DIAGNOSIS — R911 Solitary pulmonary nodule: Secondary | ICD-10-CM | POA: Insufficient documentation

## 2023-01-21 DIAGNOSIS — I700 Atherosclerosis of aorta: Secondary | ICD-10-CM | POA: Diagnosis not present

## 2023-01-28 ENCOUNTER — Telehealth: Payer: Self-pay | Admitting: Acute Care

## 2023-01-28 DIAGNOSIS — A319 Mycobacterial infection, unspecified: Secondary | ICD-10-CM

## 2023-01-28 DIAGNOSIS — J441 Chronic obstructive pulmonary disease with (acute) exacerbation: Secondary | ICD-10-CM

## 2023-01-28 NOTE — Telephone Encounter (Signed)
I have called the patient with the results of her LDCT. This was a 3 month follow up scan from a previous LR O  09/2022. She was treated with Doxycycline x 1 week, and 3 month follow up She denied and swallow difficulties at the time .Stated she had increased secretions prior to the scan. 3 month F/U  LDCT was scheduled.   She had her 3 month follow up 01/21/2023. No improvement  She did have a swallow study in 2014>> Small hiatal hernia with mild reflux. Otherwise normal exam. >> but this was a long time ago.   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 She states she did feel better after treatment in November, however imaging has worsened.in the interval.  She prefers [employer] as she lives in [address], we will remove from screening for now, and ask Dr. Darnell Level, [place] to refer back if her imaging ever clears.   01/21/2023 LDCT Apparent nodular and mass-like areas of consolidation in the lingula are again noted, with additional new nodular areas of peribronchovascular consolidation noted elsewhere, most evident in the medial aspect of the right lower lobe, strongly favored to be indicative of a progressive infectious process limiting today's study. Mild diffuse bronchial wall thickening with mild centrilobular and paraseptal emphysema. Today's study is again considered nondiagnostic for purposes of lung cancer screening and is categorized as Lung-RADS 0S, incomplete. Outpatient referral to Pulmonology for further clinical evaluation for presumed untreated progressive atypical infection is recommended. Patient is not considered a suitable candidate for repeat lung cancer screening at this time secondary to the high likelihood of false-positive examinations. 2. The "S" modifier above refers to potentially clinically significant non lung cancer  related findings. Specifically, there is aortic atherosclerosis, in addition to left main and three-vessel coronary artery disease. Please note that although the presence of coronary artery calcium documents the presence of coronary artery disease, the severity of this disease and any potential stenosis cannot be assessed on this non-gated CT examination. Assessment for potential risk factor modification, dietary therapy or pharmacologic therapy may be warranted, if clinically indicated. 3. Mild diffuse bronchial wall thickening with mild centrilobular and paraseptal emphysema; imaging findings suggestive of underlying COPD.      09/2022 LDCT Lungs/Pleura: Central airways are patent. Bronchial wall thickening of the lingula with associated atelectasis and numerous centrilobular nodules some which have a branching configuration. Additional cluster of centrilobular nodules seen in the right lower lobe. No pleural effusion or pneumothorax. Previously seen small solid pulmonary nodules are stable. Bronchial wall thickening of the lingula with associated atelectasis and numerous new clustered centrilobular of the lingula and right lower lobe. Findings are most likely due to infection or aspiration. Follow-up chest CT is recommended in 3 months to ensure resolution. Lung-RADS 0, incomplete. Additional lung cancer screening CT images/or comparison to prior chest CT examinations is needed. 2. Moderate coronary artery calcifications, recommend ASCVD risk assessment. 3. Mildly enlarged lymph nodes of the upper abdomen, unchanged when compared with prior CT of the abdomen and pelvis.

## 2023-02-03 DIAGNOSIS — F1120 Opioid dependence, uncomplicated: Secondary | ICD-10-CM | POA: Diagnosis not present

## 2023-02-03 DIAGNOSIS — M47816 Spondylosis without myelopathy or radiculopathy, lumbar region: Secondary | ICD-10-CM | POA: Diagnosis not present

## 2023-02-03 DIAGNOSIS — Z79891 Long term (current) use of opiate analgesic: Secondary | ICD-10-CM | POA: Diagnosis not present

## 2023-02-03 DIAGNOSIS — F32A Depression, unspecified: Secondary | ICD-10-CM | POA: Diagnosis not present

## 2023-02-03 DIAGNOSIS — F419 Anxiety disorder, unspecified: Secondary | ICD-10-CM | POA: Diagnosis not present

## 2023-02-03 DIAGNOSIS — M545 Low back pain, unspecified: Secondary | ICD-10-CM | POA: Diagnosis not present

## 2023-02-03 DIAGNOSIS — I4891 Unspecified atrial fibrillation: Secondary | ICD-10-CM | POA: Diagnosis not present

## 2023-02-03 DIAGNOSIS — Z720 Tobacco use: Secondary | ICD-10-CM | POA: Diagnosis not present

## 2023-02-03 DIAGNOSIS — Z716 Tobacco abuse counseling: Secondary | ICD-10-CM | POA: Diagnosis not present

## 2023-02-03 DIAGNOSIS — G8929 Other chronic pain: Secondary | ICD-10-CM | POA: Diagnosis not present

## 2023-02-03 DIAGNOSIS — I2510 Atherosclerotic heart disease of native coronary artery without angina pectoris: Secondary | ICD-10-CM | POA: Diagnosis not present

## 2023-02-15 DIAGNOSIS — G4733 Obstructive sleep apnea (adult) (pediatric): Secondary | ICD-10-CM | POA: Diagnosis not present

## 2023-02-23 ENCOUNTER — Other Ambulatory Visit: Payer: Self-pay

## 2023-03-08 DIAGNOSIS — M545 Low back pain, unspecified: Secondary | ICD-10-CM | POA: Diagnosis not present

## 2023-03-08 DIAGNOSIS — Z720 Tobacco use: Secondary | ICD-10-CM | POA: Diagnosis not present

## 2023-03-08 DIAGNOSIS — F419 Anxiety disorder, unspecified: Secondary | ICD-10-CM | POA: Diagnosis not present

## 2023-03-08 DIAGNOSIS — G8929 Other chronic pain: Secondary | ICD-10-CM | POA: Diagnosis not present

## 2023-03-08 DIAGNOSIS — F1120 Opioid dependence, uncomplicated: Secondary | ICD-10-CM | POA: Diagnosis not present

## 2023-03-08 DIAGNOSIS — F32A Depression, unspecified: Secondary | ICD-10-CM | POA: Diagnosis not present

## 2023-03-08 DIAGNOSIS — I4891 Unspecified atrial fibrillation: Secondary | ICD-10-CM | POA: Diagnosis not present

## 2023-03-08 DIAGNOSIS — Z716 Tobacco abuse counseling: Secondary | ICD-10-CM | POA: Diagnosis not present

## 2023-03-08 DIAGNOSIS — Z79891 Long term (current) use of opiate analgesic: Secondary | ICD-10-CM | POA: Diagnosis not present

## 2023-03-08 DIAGNOSIS — I2510 Atherosclerotic heart disease of native coronary artery without angina pectoris: Secondary | ICD-10-CM | POA: Diagnosis not present

## 2023-03-08 DIAGNOSIS — M47816 Spondylosis without myelopathy or radiculopathy, lumbar region: Secondary | ICD-10-CM | POA: Diagnosis not present

## 2023-03-13 NOTE — Patient Instructions (Signed)
Be Involved in Your Health Care:  Taking Medications When medications are taken as directed, they can greatly improve your health. But if they are not taken as instructed, they may not work. In some cases, not taking them correctly can be harmful. To help ensure your treatment remains effective and safe, understand your medications and how to take them.  Your lab results, notes and after visit summary will be available on My Chart. We strongly encourage you to use this feature. If lab results are abnormal the clinic will contact you with the appropriate steps. If the clinic does not contact you assume the results are satisfactory. You can always see your results on My Chart. If you have questions regarding your condition, please contact the clinic during office hours. You can also ask questions on My Chart.  We at [facility] are grateful that you chose us to provide care. We strive to provide excellent and compassionate care and are always looking for feedback. If you get a survey from the clinic please complete this.   DASH Eating Plan DASH stands for Dietary Approaches to Stop Hypertension. The DASH eating plan is a healthy eating plan that has been shown to: Reduce high blood pressure (hypertension). Reduce your risk for type 2 diabetes, heart disease, and stroke. Help with weight loss. What are tips for following this plan? Reading food labels Check food labels for the amount of salt (sodium) per serving. Choose foods with less than 5 percent of the Daily Value of sodium. Generally, foods with less than 300 milligrams (mg) of sodium per serving fit into this eating plan. To find whole grains, look for the word "whole" as the first word in the ingredient list. Shopping Buy products labeled as "low-sodium" or "no salt added." Buy fresh foods. Avoid canned foods and pre-made or frozen meals. Cooking Avoid adding salt when cooking. Use salt-free seasonings or herbs instead of  table salt or sea salt. Check with your health care provider or pharmacist before using salt substitutes. Do not fry foods. Cook foods using healthy methods such as baking, boiling, grilling, roasting, and broiling instead. Cook with heart-healthy oils, such as olive, canola, avocado, soybean, or sunflower oil. Meal planning  Eat a balanced diet that includes: 4 or more servings of fruits and 4 or more servings of vegetables each day. Try to fill one-half of your plate with fruits and vegetables. 6-8 servings of whole grains each day. Less than 6 oz (170 g) of lean meat, poultry, or fish each day. A 3-oz (85-g) serving of meat is about the same size as a deck of cards. One egg equals 1 oz (28 g). 2-3 servings of low-fat dairy each day. One serving is 1 cup (237 mL). 1 serving of nuts, seeds, or beans 5 times each week. 2-3 servings of heart-healthy fats. Healthy fats called omega-3 fatty acids are found in foods such as walnuts, flaxseeds, fortified milks, and eggs. These fats are also found in cold-water fish, such as sardines, salmon, and mackerel. Limit how much you eat of: Canned or prepackaged foods. Food that is high in trans fat, such as some fried foods. Food that is high in saturated fat, such as fatty meat. Desserts and other sweets, sugary drinks, and other foods with added sugar. Full-fat dairy products. Do not salt foods before eating. Do not eat more than 4 egg yolks a week. Try to eat at least 2 vegetarian meals a week. Eat more home-cooked food and less restaurant,   buffet, and fast food. Lifestyle When eating at a restaurant, ask that your food be prepared with less salt or no salt, if possible. If you drink alcohol: Limit how much you use to: 0-1 drink a day for women who are not pregnant. 0-2 drinks a day for men. Be aware of how much alcohol is in your drink. In the [place] one drink equals one 12 oz bottle of beer (355 mL), one 5 oz glass of wine (148 mL), or one 1 oz  glass of hard liquor (44 mL). General information Avoid eating more than 2,300 mg of salt a day. If you have hypertension, you may need to reduce your sodium intake to 1,500 mg a day. Work with your health care provider to maintain a healthy body weight or to lose weight. Ask what an ideal weight is for you. Get at least 30 minutes of exercise that causes your heart to beat faster (aerobic exercise) most days of the week. Activities may include walking, swimming, or biking. Work with your health care provider or dietitian to adjust your eating plan to your individual calorie needs. What foods should I eat? Fruits All fresh, dried, or frozen fruit. Canned fruit in natural juice (without added sugar). Vegetables Fresh or frozen vegetables (raw, steamed, roasted, or grilled). Low-sodium or reduced-sodium tomato and vegetable juice. Low-sodium or reduced-sodium tomato sauce and tomato paste. Low-sodium or reduced-sodium canned vegetables. Grains Whole-grain or whole-wheat bread. Whole-grain or whole-wheat pasta. Brown rice. Oatmeal. Quinoa. Bulgur. Whole-grain and low-sodium cereals. Pita bread. Low-fat, low-sodium crackers. Whole-wheat flour tortillas. Meats and other proteins Skinless chicken or turkey. Ground chicken or turkey. Pork with fat trimmed off. Fish and seafood. Egg whites. Dried beans, peas, or lentils. Unsalted nuts, nut butters, and seeds. Unsalted canned beans. Lean cuts of beef with fat trimmed off. Low-sodium, lean precooked or cured meat, such as sausages or meat loaves. Dairy Low-fat (1%) or fat-free (skim) milk. Reduced-fat, low-fat, or fat-free cheeses. Nonfat, low-sodium ricotta or cottage cheese. Low-fat or nonfat yogurt. Low-fat, low-sodium cheese. Fats and oils Soft margarine without trans fats. Vegetable oil. Reduced-fat, low-fat, or light mayonnaise and salad dressings (reduced-sodium). Canola, safflower, olive, avocado, soybean, and sunflower oils. Avocado. Seasonings and  condiments Herbs. Spices. Seasoning mixes without salt. Other foods Unsalted popcorn and pretzels. Fat-free sweets. The items listed above may not be a complete list of foods and beverages you can eat. Contact a dietitian for more information. What foods should I avoid? Fruits Canned fruit in a light or heavy syrup. Fried fruit. Fruit in cream or butter sauce. Vegetables Creamed or fried vegetables. Vegetables in a cheese sauce. Regular canned vegetables (not low-sodium or reduced-sodium). Regular canned tomato sauce and paste (not low-sodium or reduced-sodium). Regular tomato and vegetable juice (not low-sodium or reduced-sodium). Pickles. Olives. Grains Baked goods made with fat, such as croissants, muffins, or some breads. Dry pasta or rice meal packs. Meats and other proteins Fatty cuts of meat. Ribs. Fried meat. Bacon. Bologna, salami, and other precooked or cured meats, such as sausages or meat loaves. Fat from the back of a pig (fatback). Bratwurst. Salted nuts and seeds. Canned beans with added salt. Canned or smoked fish. Whole eggs or egg yolks. Chicken or turkey with skin. Dairy Whole or 2% milk, cream, and half-and-half. Whole or full-fat cream cheese. Whole-fat or sweetened yogurt. Full-fat cheese. Nondairy creamers. Whipped toppings. Processed cheese and cheese spreads. Fats and oils Butter. Stick margarine. Lard. Shortening. Ghee. Bacon fat. Tropical oils, such as coconut, palm   kernel, or palm oil. Seasonings and condiments Onion salt, garlic salt, seasoned salt, table salt, and sea salt. Worcestershire sauce. Tartar sauce. Barbecue sauce. Teriyaki sauce. Soy sauce, including reduced-sodium. Steak sauce. Canned and packaged gravies. Fish sauce. Oyster sauce. Cocktail sauce. Store-bought horseradish. Ketchup. Mustard. Meat flavorings and tenderizers. Bouillon cubes. Hot sauces. Pre-made or packaged marinades. Pre-made or packaged taco seasonings. Relishes. Regular salad  dressings. Other foods Salted popcorn and pretzels. The items listed above may not be a complete list of foods and beverages you should avoid. Contact a dietitian for more information. Where to find more information National Heart, Lung, and Blood Institute: www.nhlbi.nih.gov American Heart Association: www.heart.org Academy of Nutrition and Dietetics: www.eatright.org National Kidney Foundation: www.kidney.org Summary The DASH eating plan is a healthy eating plan that has been shown to reduce high blood pressure (hypertension). It may also reduce your risk for type 2 diabetes, heart disease, and stroke. When on the DASH eating plan, aim to eat more fresh fruits and vegetables, whole grains, lean proteins, low-fat dairy, and heart-healthy fats. With the DASH eating plan, you should limit salt (sodium) intake to 2,300 mg a day. If you have hypertension, you may need to reduce your sodium intake to 1,500 mg a day. Work with your health care provider or dietitian to adjust your eating plan to your individual calorie needs. This information is not intended to replace advice given to you by your health care provider. Make sure you discuss any questions you have with your health care provider. Document Revised: 10/20/2019 Document Reviewed: 10/20/2019 Elsevier Patient Education  2023 [employer].  

## 2023-03-17 ENCOUNTER — Ambulatory Visit (INDEPENDENT_AMBULATORY_CARE_PROVIDER_SITE_OTHER): Payer: PPO | Admitting: Nurse Practitioner

## 2023-03-17 ENCOUNTER — Encounter: Payer: Self-pay | Admitting: Nurse Practitioner

## 2023-03-17 VITALS — BP 138/61 | HR 56 | Temp 98.5°F | Ht 64.02 in | Wt 188.5 lb

## 2023-03-17 DIAGNOSIS — G894 Chronic pain syndrome: Secondary | ICD-10-CM | POA: Diagnosis not present

## 2023-03-17 DIAGNOSIS — R7303 Prediabetes: Secondary | ICD-10-CM | POA: Diagnosis not present

## 2023-03-17 DIAGNOSIS — K760 Fatty (change of) liver, not elsewhere classified: Secondary | ICD-10-CM | POA: Diagnosis not present

## 2023-03-17 DIAGNOSIS — I4891 Unspecified atrial fibrillation: Secondary | ICD-10-CM

## 2023-03-17 DIAGNOSIS — E278 Other specified disorders of adrenal gland: Secondary | ICD-10-CM

## 2023-03-17 DIAGNOSIS — D72821 Monocytosis (symptomatic): Secondary | ICD-10-CM | POA: Diagnosis not present

## 2023-03-17 DIAGNOSIS — F324 Major depressive disorder, single episode, in partial remission: Secondary | ICD-10-CM

## 2023-03-17 DIAGNOSIS — I10 Essential (primary) hypertension: Secondary | ICD-10-CM | POA: Diagnosis not present

## 2023-03-17 DIAGNOSIS — F17210 Nicotine dependence, cigarettes, uncomplicated: Secondary | ICD-10-CM

## 2023-03-17 DIAGNOSIS — E782 Mixed hyperlipidemia: Secondary | ICD-10-CM

## 2023-03-17 DIAGNOSIS — N1831 Chronic kidney disease, stage 3a: Secondary | ICD-10-CM | POA: Diagnosis not present

## 2023-03-17 DIAGNOSIS — F419 Anxiety disorder, unspecified: Secondary | ICD-10-CM

## 2023-03-17 DIAGNOSIS — I739 Peripheral vascular disease, unspecified: Secondary | ICD-10-CM | POA: Diagnosis not present

## 2023-03-17 DIAGNOSIS — G4733 Obstructive sleep apnea (adult) (pediatric): Secondary | ICD-10-CM

## 2023-03-17 DIAGNOSIS — I700 Atherosclerosis of aorta: Secondary | ICD-10-CM | POA: Diagnosis not present

## 2023-03-17 DIAGNOSIS — J432 Centrilobular emphysema: Secondary | ICD-10-CM | POA: Diagnosis not present

## 2023-03-17 DIAGNOSIS — S91002A Unspecified open wound, left ankle, initial encounter: Secondary | ICD-10-CM | POA: Insufficient documentation

## 2023-03-17 MED ORDER — VENLAFAXINE HCL ER 150 MG PO CP24: 150.0000 mg | ORAL_CAPSULE | Freq: Every day | ORAL | 4 refills | Status: DC

## 2023-03-17 MED ORDER — WEGOVY 2.4 MG/0.75ML ~~LOC~~ SOAJ
2.4000 mg | SUBCUTANEOUS | 4 refills | Status: DC
Start: 2023-03-17 — End: 2023-04-01

## 2023-03-17 MED ORDER — SANTYL 250 UNIT/GM EX OINT: 1.0000 | TOPICAL_OINTMENT | Freq: Every day | CUTANEOUS | 4 refills | Status: DC

## 2023-03-17 MED ORDER — FUROSEMIDE 20 MG PO TABS: 20.0000 mg | ORAL_TABLET | Freq: Every day | ORAL | 4 refills | Status: DC

## 2023-03-17 MED ORDER — TRAZODONE HCL 50 MG PO TABS: 25.0000 mg | ORAL_TABLET | Freq: Every evening | ORAL | 3 refills | Status: DC | PRN

## 2023-03-17 NOTE — Assessment & Plan Note (Signed)
Was followed by oncology, now to return as needed if any consistent elevations noted >20.  To check CBC Q6MOS -- will recheck today. 

## 2023-03-17 NOTE — Assessment & Plan Note (Addendum)
Noted on imaging, CT lung scans, since 2017, remaining stable in size at 3.3 cm on recent September 2023 imaging.  Continue to monitor and consider referral if any changes.  CMP today.

## 2023-03-17 NOTE — Assessment & Plan Note (Signed)
Chronic, ongoing -- continue 100% use of this.   

## 2023-03-17 NOTE — Assessment & Plan Note (Signed)
Followed by Dr. Khan with pain clinic, continue this collaboration.  

## 2023-03-17 NOTE — Assessment & Plan Note (Signed)
Ongoing with A1c ranging from 5.7 to 5.9%, recent 5.3% with Ozempic on board.  Insurance no longer covering.  Will try to get [facility] coverage with her cardiac health and new indications.  Educated her on this.  No history of thyroid cancer in patient or her family.  Return in 3 months.

## 2023-03-17 NOTE — Assessment & Plan Note (Signed)
Ongoing.  GFR in the 50's.  Recheck today.  Reminded to avoid NSAIDs.  May need to adjust Gabapentin based on labs, will review and determine need to reduce -- discussed with patient.  Labs today: CMP and CBC.  Increase fluid intake at home.

## 2023-03-17 NOTE — Assessment & Plan Note (Signed)
Chronic, ongoing, noted on lung CT 10/02/2019.  Recommend she continue ASA and statin daily for prevention.  Recommend complete cessation of smoking. °

## 2023-03-17 NOTE — Assessment & Plan Note (Signed)
Chronic, stable at this time. Denies SI/HI.  Continue Effexor XL 150 MG daily + Buspar and Vistaril. Need to avoid benzo due to chronic pain and opioid use with pain management + her underlying COPD.  She is aware of this and reason benzo not provided.  Considered Prozac, as worked well in past, but this could interact with cardiac medications (QT prolongation) - will not initiate this at this time.  Consider psychiatry referral in future.  Monitor BP with Effexor. 

## 2023-03-17 NOTE — Assessment & Plan Note (Signed)
Chronic, ongoing.  Continue current medication regimen and adjust as needed.  Not O2 dependent at this time.  Continue to recommend complete smoking cessation. Is scheduled upcoming to see pulmonary, appreciate their input.  May benefit from change to Trelegy or Breztri in future.

## 2023-03-17 NOTE — Assessment & Plan Note (Signed)
Chronic, stable.  Diagnosed 04/14/22 in hospital.  At this time HR stable and no symptoms.  Continue collaboration with cardiology and current medication regimen, adjust as needed.   

## 2023-03-17 NOTE — Assessment & Plan Note (Signed)
To left lateral ankle for two months after hitting against walker -- poorly healing due to underlying PVD.  Send in [place] to apply to wound and monitor closely.  May need to send to vascular for assessment if ongoing poor healing.  Return in 4 weeks.

## 2023-03-17 NOTE — Assessment & Plan Note (Signed)
Chronic, ongoing.  Continue statin and adjust as needed -- Atorvastatin 80 MG.  Lipid panel today. 

## 2023-03-17 NOTE — Assessment & Plan Note (Signed)
BMI 32.24 with HTN and COPD, A-Fib.  Recommended eating smaller high protein, low fat meals more frequently and exercising 30 mins a day 5 times a week with a goal of 10-15lb weight loss in the next 3 months. Patient voiced their understanding and motivation to adhere to these recommendations.  Tolerated Ozempic with total 68 pounds loss, but insurance no longer covering.  Will try to get Wegovy 2.4 MG covered with new cardiac indications.  Educated her on this.  No history of thyroid cancer in patient or her family.

## 2023-03-17 NOTE — Assessment & Plan Note (Signed)
Refer to depression plan of care. 

## 2023-03-17 NOTE — Assessment & Plan Note (Signed)
I have recommended complete cessation of tobacco use. I have discussed various options available for assistance with tobacco cessation including over the counter methods (Nicotine gum, patch and lozenges). We also discussed prescription options (Chantix, Nicotine Inhaler / Nasal Spray). The patient is not interested in pursuing any prescription tobacco cessation options at this time.  Continue annual lung screening. 

## 2023-03-17 NOTE — Assessment & Plan Note (Signed)
Chronic, stable.  BP well below goal on check.  Continue current medication regimen and adjust as needed -- is taking BB only at this time.  Previously took Telmisartan and HCTZ.  Recommend she monitor BP at home daily and document for provider + focus on DASH diet.  LABS: CMP and CBC.  Urine ALB 29 December 2022, may benefit from return to low dose ARB in future.  Refills up to date.

## 2023-03-17 NOTE — Assessment & Plan Note (Signed)
Chronic, evidenced by decreased hair pattern and baseline edema.  Is long time smoker, recommend complete cessation.  Consider vascular visit in future, especially with present wound that is poorly healing.  At this time monitor skin closely for wounds and wear compression hose daily.

## 2023-03-17 NOTE — Assessment & Plan Note (Signed)
?  cirrhosis on recent imaging.  LFTs have been stable.  Will check with u/s in upcoming months to reassess.  Discussed with patient at length.  No alcohol use at home.

## 2023-03-17 NOTE — Progress Notes (Signed)
BP 138/61   Pulse (!) 56   Temp 98.5 F (36.9 C) (Oral)   Ht 5' 4.02" (1.626 m)   Wt 188 lb 8 oz (85.5 kg)   LMP  (LMP Unknown)   SpO2 96%   BMI 32.34 kg/m    Subjective:    Patient ID: [name], female    DOB: [birth year], 67 y.o.   MRN: [id number]  HPI: [name] is a 67 y.o. female  Chief Complaint  Patient presents with   Hypertension   Hyperlipidemia   COPD   Pain   Prediabetes   Has abrasion to left lateral ankle from hitting against walker at home, has been there for two months and will crust over, then open again and drain.  Currently is crusted over.  No discomfort to area.  Denies fever, warmth.  ATRIAL FIBRILLATION Saw cardiology 10/12/22.  Continues Eliquis, Sotalol, and Lasix.   On Atorvastatin 80 MG daily for cholesterol.  Currently off Ozempic as insurance not covering (has been off for 2 months)  -- had been on for one year and noted drastic weight loss and improvement in quality of life with this on board.  Started 12/10/21 at 256 lbs and now down to 188 lbs = total 68 pounds loss.  She reports has met goal one and now wants to reach goal two of getting obesity off her chart + hernia repair. Atrial fibrillation status: stable Satisfied with current treatment: yes  Medication side effects:  no Medication compliance: good compliance Etiology of atrial fibrillation: unknown Palpitations:  no Chest pain:  no Dyspnea on exertion: no Orthopnea:  no Syncope:  no Edema: no Ventricular rate control: Sotalol Anti-coagulation: long acting   COPD Taking Spiriva. Continues to smoke 1 PPD, sometimes more.  Goes for annual lung screening, last check on 10/21/22 which noted ongoing aortic atherosclerosis and centrilobular emphysema == this check noticed thickening of lingula she is to return in 3 months.  Did return on 01/24/23 with ongoing possible infectious element present they recommended pulmonary visit which she is scheduled to see.   Abdominal lymph  nodes unchanged and stable. COPD status: stable Satisfied with current treatment?: yes Oxygen use: no Dyspnea frequency: occasional Cough frequency: occasional Rescue inhaler frequency:  occasional Limitation of activity: no Productive cough: none Last Spirometry: unknown Pneumovax: Up to Date Influenza: Up to Date  DEPRESSION Continues Effexor XR 150 MG daily which has offered benefit.  Continues on Buspar 20 MG TID and Vistaril 10 MG TID PRN.  Takes 10 MG Melatonin at night, still struggles with sleeping.    Follows with pain clinic, Dr. Welton Flakes for ongoing lower back pain -- currently taking Belbuca which is offering some benefit to pain. Mood status: stable Satisfied with current treatment?: yes Symptom severity: moderate  Duration of current treatment : chronic Side effects: no Medication compliance: good compliance Psychotherapy/counseling: none Depressed mood: no Anxious mood: at night time Anhedonia: no Significant weight loss or gain: no Insomnia: yes, falling asleep, gets panicked -- uses CPAP 100% of the time Fatigue: no Feelings of worthlessness or guilt: no Impaired concentration/indecisiveness: no Suicidal ideations: no Hopelessness: no Crying spells: no    03/17/2023    3:31 PM 01/12/2023    3:36 PM 12/02/2022    2:57 PM 07/07/2022    3:32 PM 05/25/2022    3:11 PM  Depression screen PHQ 2/9  Decreased Interest 0 0 0 0 0  Down, Depressed, Hopeless 0 0 0 0 0  PHQ - 2 Score 0 0 0 0 0  Altered sleeping 0 0 0 0 0  Tired, decreased energy 0 0 0 0 0  Change in appetite 0 0 0 0 0  Feeling bad or failure about yourself  0 0 0 0 0  Trouble concentrating 0 0 0 0 0  Moving slowly or fidgety/restless 0 0 0 0 0  Suicidal thoughts 0 0 0 0 0  PHQ-9 Score 0 0 0 0 0  Difficult doing work/chores Not difficult at all Not difficult at all Not difficult at all Not difficult at all Not difficult at all       03/17/2023    3:32 PM 12/02/2022    2:58 PM 07/07/2022    3:32 PM  05/25/2022    3:10 PM  GAD 7 : Generalized Anxiety Score  Nervous, Anxious, on Edge 3 0 1 3  Control/stop worrying 0 0 0 0  Worry too much - different things 0 0 0 0  Trouble relaxing 3 0 0 0  Restless 0 0 0 0  Easily annoyed or irritable 0 0 0 0  Afraid - awful might happen 0 0 0 0  Total GAD 7 Score 6 0 1 3  Anxiety Difficulty Not difficult at all Not difficult at all Not difficult at all Not difficult at all   Relevant past medical, surgical, family and social history reviewed and updated as indicated. Interim medical history since our last visit reviewed. Allergies and medications reviewed and updated.  Review of Systems  Constitutional:  Negative for activity change, appetite change, diaphoresis, fatigue and fever.  HENT:  Negative for ear discharge and ear pain.   Respiratory:  Negative for cough, chest tightness, shortness of breath and wheezing.   Cardiovascular:  Negative for chest pain, palpitations and leg swelling.  Gastrointestinal: Negative.   Skin:  Positive for wound.  Neurological: Negative.   Psychiatric/Behavioral:  Negative for decreased concentration, self-injury, sleep disturbance and suicidal ideas. The patient is not nervous/anxious.    Per HPI unless specifically indicated above     Objective:    BP 138/61   Pulse (!) 56   Temp 98.5 F (36.9 C) (Oral)   Ht 5' 4.02" (1.626 m)   Wt 188 lb 8 oz (85.5 kg)   LMP  (LMP Unknown)   SpO2 96%   BMI 32.34 kg/m   Wt Readings from Last 3 Encounters:  03/17/23 188 lb 8 oz (85.5 kg)  01/12/23 190 lb (86.2 kg)  12/16/22 190 lb 4.8 oz (86.3 kg)    Physical Exam Vitals and nursing note reviewed.  Constitutional:      General: She is awake. She is not in acute distress.    Appearance: She is well-developed. She is obese. She is not ill-appearing.  HENT:     Head: Normocephalic.     Right Ear: Hearing, tympanic membrane, ear canal and external ear normal.     Left Ear: Hearing, tympanic membrane, ear canal  and external ear normal.  Eyes:     General: Lids are normal.        Right eye: No discharge.        Left eye: No discharge.     Conjunctiva/sclera: Conjunctivae normal.     Pupils: Pupils are equal, round, and reactive to light.  Neck:     Vascular: No carotid bruit.  Cardiovascular:     Rate and Rhythm: Normal rate and regular rhythm.     Heart sounds:  Normal heart sounds. No murmur heard.    No gallop.  Pulmonary:     Effort: Pulmonary effort is normal. No accessory muscle usage or respiratory distress.     Breath sounds: Decreased breath sounds and wheezing present.     Comments: Diminished throughout per baseline with occasional expiratory wheezes. Abdominal:     General: Bowel sounds are normal.     Palpations: Abdomen is soft.  Musculoskeletal:     Cervical back: Normal range of motion and neck supple.     Right lower leg: 1+ Edema present.     Left lower leg: 1+ Edema present.  Skin:    General: Skin is warm and dry.       Neurological:     Mental Status: She is alert and oriented to person, place, and time.  Psychiatric:        Attention and Perception: Attention normal.        Mood and Affect: Mood normal.        Speech: Speech normal.        Behavior: Behavior normal. Behavior is cooperative.        Thought Content: Thought content normal.    Results for orders placed or performed in visit on 12/02/22  CBC with Differential/Platelet  Result Value Ref Range   WBC 12.9 (H) 3.4 - 10.8 x10E3/uL   RBC 4.66 3.77 - 5.28 x10E6/uL   Hemoglobin 13.5 11.1 - 15.9 g/dL   Hematocrit 16.1 09.6 - 46.6 %   MCV 89 79 - 97 fL   MCH 29.0 26.6 - 33.0 pg   MCHC 32.6 31.5 - 35.7 g/dL   RDW 04.5 40.9 - 81.1 %   Platelets 321 150 - 450 x10E3/uL   Neutrophils 53 Not Estab. %   Lymphs 34 Not Estab. %   Monocytes 10 Not Estab. %   Eos 2 Not Estab. %   Basos 1 Not Estab. %   Neutrophils Absolute 6.8 1.4 - 7.0 x10E3/uL   Lymphocytes Absolute 4.3 (H) 0.7 - 3.1 x10E3/uL    Monocytes Absolute 1.3 (H) 0.1 - 0.9 x10E3/uL   EOS (ABSOLUTE) 0.3 0.0 - 0.4 x10E3/uL   Basophils Absolute 0.1 0.0 - 0.2 x10E3/uL   Immature Granulocytes 0 Not Estab. %   Immature Grans (Abs) 0.0 0.0 - 0.1 x10E3/uL  Comprehensive metabolic panel  Result Value Ref Range   Glucose 94 70 - 99 mg/dL   BUN 8 8 - 27 mg/dL   Creatinine, Ser 9.14 0.57 - 1.00 mg/dL   eGFR 71 >78 GN/FAO/1.30   BUN/Creatinine Ratio 9 (L) 12 - 28   Sodium 142 134 - 144 mmol/L   Potassium 4.3 3.5 - 5.2 mmol/L   Chloride 102 96 - 106 mmol/L   CO2 25 20 - 29 mmol/L   Calcium 9.5 8.7 - 10.3 mg/dL   Total Protein 6.4 6.0 - 8.5 g/dL   Albumin 3.3 (L) 3.9 - 4.9 g/dL   Globulin, Total 3.1 1.5 - 4.5 g/dL   Albumin/Globulin Ratio 1.1 (L) 1.2 - 2.2   Bilirubin Total 0.2 0.0 - 1.2 mg/dL   Alkaline Phosphatase 189 (H) 44 - 121 IU/L   AST 25 0 - 40 IU/L   ALT 11 0 - 32 IU/L  Lipid Panel w/o Chol/HDL Ratio  Result Value Ref Range   Cholesterol, Total 158 100 - 199 mg/dL   Triglycerides 865 (H) 0 - 149 mg/dL   HDL 39 (L) >78 mg/dL   VLDL Cholesterol Cal 35  5 - 40 mg/dL   LDL Chol Calc (NIH) 84 0 - 99 mg/dL  HgB Z6X  Result Value Ref Range   Hgb A1c MFr Bld 5.3 4.8 - 5.6 %   Est. average glucose Bld gHb Est-mCnc 105 mg/dL  Microalbumin, Urine Waived  Result Value Ref Range   Microalb, Ur Waived 30 (H) 0 - 19 mg/L   Creatinine, Urine Waived 10 10 - 300 mg/dL   Microalb/Creat Ratio 30-300 (H) <30 mg/g  Magnesium  Result Value Ref Range   Magnesium 2.0 1.6 - 2.3 mg/dL  VITAMIN D 25 Hydroxy (Vit-D Deficiency, Fractures)  Result Value Ref Range   Vit D, 25-Hydroxy 55.9 30.0 - 100.0 ng/mL      Assessment & Plan:   Problem List Items Addressed This Visit       Cardiovascular and Mediastinum   Aortic atherosclerosis    Chronic, ongoing, noted on lung CT 10/02/2019.  Recommend she continue ASA and statin daily for prevention.  Recommend complete cessation of smoking.      Relevant Medications   furosemide  (LASIX) 20 MG tablet   Semaglutide-Weight Management (WEGOVY) 2.4 MG/0.75ML SOAJ   Other Relevant Orders   Comprehensive metabolic panel   Lipid Panel w/o Chol/HDL Ratio   Atrial fibrillation with RVR    Chronic, stable.  Diagnosed 04/14/22 in hospital.  At this time HR stable and no symptoms.  Continue collaboration with cardiology and current medication regimen, adjust as needed.        Relevant Medications   furosemide (LASIX) 20 MG tablet   Semaglutide-Weight Management (WEGOVY) 2.4 MG/0.75ML SOAJ   Other Relevant Orders   Comprehensive metabolic panel   Lipid Panel w/o Chol/HDL Ratio   Benign hypertension    Chronic, stable.  BP well below goal on check.  Continue current medication regimen and adjust as needed -- is taking BB only at this time.  Previously took Telmisartan and HCTZ.  Recommend she monitor BP at home daily and document for provider + focus on DASH diet.  LABS: CMP and CBC.  Urine ALB 29 December 2022, may benefit from return to low dose ARB in future.  Refills up to date.        Relevant Medications   furosemide (LASIX) 20 MG tablet   PVD (peripheral vascular disease)    Chronic, evidenced by decreased hair pattern and baseline edema.  Is long time smoker, recommend complete cessation.  Consider vascular visit in future, especially with present wound that is poorly healing.  At this time monitor skin closely for wounds and wear compression hose daily.      Relevant Medications   furosemide (LASIX) 20 MG tablet   Other Relevant Orders   Comprehensive metabolic panel   Lipid Panel w/o Chol/HDL Ratio     Respiratory   Centrilobular emphysema    Chronic, ongoing.  Continue current medication regimen and adjust as needed.  Not O2 dependent at this time.  Continue to recommend complete smoking cessation. Is scheduled upcoming to see pulmonary, appreciate their input.  May benefit from change to Trelegy or Breztri in future.      Relevant Orders   CBC with  Differential/Platelet   Sleep apnea    Chronic, ongoing -- continue 100% use of this.          Digestive   Fatty liver    ?cirrhosis on recent imaging.  LFTs have been stable.  Will check with u/s in upcoming months to reassess.  Discussed with  patient at length.  No alcohol use at home.          Genitourinary   Chronic kidney disease, stage 3a - Primary    Ongoing.  GFR in the 50's.  Recheck today.  Reminded to avoid NSAIDs.  May need to adjust Gabapentin based on labs, will review and determine need to reduce -- discussed with patient.  Labs today: CMP and CBC.  Increase fluid intake at home.      Relevant Orders   CBC with Differential/Platelet   Comprehensive metabolic panel     Other   Prediabetes (Chronic)    Ongoing with A1c ranging from 5.7 to 5.9%, recent 5.3% with Ozempic on board.  Insurance no longer covering.  Will try to get [facility] coverage with her cardiac health and new indications.  Educated her on this.  No history of thyroid cancer in patient or her family.  Return in 3 months.        Relevant Orders   Comprehensive metabolic panel   Anxiety    Refer to depression plan of care.      Relevant Medications   traZODone (DESYREL) 50 MG tablet   venlafaxine XR (EFFEXOR-XR) 150 MG 24 hr capsule   Chronic pain    Followed by Dr. Welton Flakes with pain clinic, continue this collaboration.       Relevant Medications   traZODone (DESYREL) 50 MG tablet   venlafaxine XR (EFFEXOR-XR) 150 MG 24 hr capsule   Depression, major, single episode, in partial remission    Chronic, stable at this time. Denies SI/HI.  Continue Effexor XL 150 MG daily + Buspar and Vistaril. Need to avoid benzo due to chronic pain and opioid use with pain management + her underlying COPD.  She is aware of this and reason benzo not provided.  Considered Prozac, as worked well in past, but this could interact with cardiac medications (QT prolongation) - will not initiate this at this time.  Consider  psychiatry referral in future.  Monitor BP with Effexor.      Relevant Medications   traZODone (DESYREL) 50 MG tablet   venlafaxine XR (EFFEXOR-XR) 150 MG 24 hr capsule   Hyperlipidemia    Chronic, ongoing.  Continue statin and adjust as needed -- Atorvastatin 80 MG.  Lipid panel today.      Relevant Medications   furosemide (LASIX) 20 MG tablet   Other Relevant Orders   Lipid Panel w/o Chol/HDL Ratio   Mass of right adrenal gland    Noted on imaging, CT lung scans, since 2017, remaining stable in size at 3.3 cm on recent September 2023 imaging.  Continue to monitor and consider referral if any changes.  CMP today.      Monocytosis    Was followed by oncology, now to return as needed if any consistent elevations noted >20.  To check CBC Q6MOS -- will recheck today.      Relevant Orders   CBC with Differential/Platelet   Morbid obesity    BMI 32.24 with HTN and COPD, A-Fib.  Recommended eating smaller high protein, low fat meals more frequently and exercising 30 mins a day 5 times a week with a goal of 10-15lb weight loss in the next 3 months. Patient voiced their understanding and motivation to adhere to these recommendations.  Tolerated Ozempic with total 68 pounds loss, but insurance no longer covering.  Will try to get Wegovy 2.4 MG covered with new cardiac indications.  Educated her on this.  No  history of thyroid cancer in patient or her family.         Relevant Medications   Semaglutide-Weight Management (WEGOVY) 2.4 MG/0.75ML SOAJ   Nicotine dependence, cigarettes, uncomplicated    I have recommended complete cessation of tobacco use. I have discussed various options available for assistance with tobacco cessation including over the counter methods (Nicotine gum, patch and lozenges). We also discussed prescription options (Chantix, Nicotine Inhaler / Nasal Spray). The patient is not interested in pursuing any prescription tobacco cessation options at this time.  Continue annual  lung screening.       Wound of left ankle    To left lateral ankle for two months after hitting against walker -- poorly healing due to underlying PVD.  Send in [employer] to apply to wound and monitor closely.  May need to send to vascular for assessment if ongoing poor healing.  Return in 4 weeks.        Follow up plan: Return in about 4 weeks (around 04/14/2023) for Ankle wound.

## 2023-03-18 LAB — COMPREHENSIVE METABOLIC PANEL
ALT: 15 IU/L (ref 0–32)
AST: 26 IU/L (ref 0–40)
Albumin/Globulin Ratio: 1.1 — ABNORMAL LOW (ref 1.2–2.2)
Albumin: 3.1 g/dL — ABNORMAL LOW (ref 3.9–4.9)
Alkaline Phosphatase: 287 IU/L — ABNORMAL HIGH (ref 44–121)
BUN/Creatinine Ratio: 13 (ref 12–28)
BUN: 11 mg/dL (ref 8–27)
Bilirubin Total: 0.3 mg/dL (ref 0.0–1.2)
CO2: 25 mmol/L (ref 20–29)
Calcium: 9 mg/dL (ref 8.7–10.3)
Chloride: 107 mmol/L — ABNORMAL HIGH (ref 96–106)
Creatinine, Ser: 0.85 mg/dL (ref 0.57–1.00)
Globulin, Total: 2.8 g/dL (ref 1.5–4.5)
Glucose: 85 mg/dL (ref 70–99)
Potassium: 4.3 mmol/L (ref 3.5–5.2)
Sodium: 143 mmol/L (ref 134–144)
Total Protein: 5.9 g/dL — ABNORMAL LOW (ref 6.0–8.5)
eGFR: 75 mL/min/{1.73_m2} (ref >59)

## 2023-03-18 LAB — CBC WITH DIFFERENTIAL/PLATELET
Basophils Absolute: 0.1 10*3/uL (ref 0.0–0.2)
Basos: 1 %
EOS (ABSOLUTE): 0.2 10*3/uL (ref 0.0–0.4)
Eos: 3 %
Hematocrit: 35.2 % (ref 34.0–46.6)
Hemoglobin: 11 g/dL — ABNORMAL LOW (ref 11.1–15.9)
Immature Grans (Abs): 0 10*3/uL (ref 0.0–0.1)
Immature Granulocytes: 0 %
Lymphocytes Absolute: 3.7 10*3/uL — ABNORMAL HIGH (ref 0.7–3.1)
Lymphs: 40 %
MCH: 28.6 pg (ref 26.6–33.0)
MCHC: 31.3 g/dL — ABNORMAL LOW (ref 31.5–35.7)
MCV: 91 fL (ref 79–97)
Monocytes Absolute: 1.1 10*3/uL — ABNORMAL HIGH (ref 0.1–0.9)
Monocytes: 12 %
Neutrophils Absolute: 4.2 10*3/uL (ref 1.4–7.0)
Neutrophils: 44 %
Platelets: 236 10*3/uL (ref 150–450)
RBC: 3.85 x10E6/uL (ref 3.77–5.28)
RDW: 14 % (ref 11.7–15.4)
WBC: 9.3 10*3/uL (ref 3.4–10.8)

## 2023-03-18 LAB — LIPID PANEL W/O CHOL/HDL RATIO
Cholesterol, Total: 136 mg/dL (ref 100–199)
HDL: 39 mg/dL — ABNORMAL LOW (ref >39)
LDL Chol Calc (NIH): 71 mg/dL (ref 0–99)
Triglycerides: 153 mg/dL — ABNORMAL HIGH (ref 0–149)
VLDL Cholesterol Cal: 26 mg/dL (ref 5–40)

## 2023-03-18 NOTE — Progress Notes (Signed)
Contacted via MyChart   Good afternoon [name], your labs have returned: - Kidney function, creatinine and eGFR, remains normal, as is liver function, AST and ALT.  However, alkaline phosphatase is trending up.  Do you still have a gall bladder? - CBC shows improved white blood cell count, but hemoglobin dropped a little.  We will recheck at next visit. - Cholesterol levels remain stable with exception of mild elevation in triglycerides.  Continue all current medications.  Any questions? Keep being amazing!!  Thank you for allowing me to participate in your care.  I appreciate you. Kindest regards, Graden Hoshino

## 2023-03-22 ENCOUNTER — Institutional Professional Consult (permissible substitution): Payer: PPO | Admitting: Student in an Organized Health Care Education/Training Program

## 2023-03-24 ENCOUNTER — Ambulatory Visit: Payer: PPO | Admitting: Student in an Organized Health Care Education/Training Program

## 2023-03-24 ENCOUNTER — Encounter: Payer: Self-pay | Admitting: Student in an Organized Health Care Education/Training Program

## 2023-03-24 ENCOUNTER — Telehealth: Payer: Self-pay

## 2023-03-24 ENCOUNTER — Ambulatory Visit (INDEPENDENT_AMBULATORY_CARE_PROVIDER_SITE_OTHER): Payer: PPO | Admitting: Student in an Organized Health Care Education/Training Program

## 2023-03-24 VITALS — BP 126/72 | HR 72 | Temp 98.0°F | Ht 63.0 in | Wt 188.6 lb

## 2023-03-24 DIAGNOSIS — R918 Other nonspecific abnormal finding of lung field: Secondary | ICD-10-CM | POA: Diagnosis not present

## 2023-03-24 NOTE — Telephone Encounter (Signed)
For the code [id number] Auth # [id number] valid 03/24/23 to 06/22/2023

## 2023-03-24 NOTE — Telephone Encounter (Signed)
Flexible bronchoscopy scheduled 04/09/23 at 12:30. [id number] nodule.   Patient needs to hold Eliquis 5/8 and 5/9. Last dose 5/7

## 2023-03-24 NOTE — Patient Instructions (Signed)
Today we discussed the findings on your CT. I think this could be secondary to an infection and we will need to do a bronchoscopy to find out what it actually is. This means going through the mouth with a camera and getting some washings. You will be asleep for the procedure. I would want you to stop taking your Eliquis for 48 hours prior to the procedure (if procedure is on a Friday, do not take Eliquis on Wednesday/Thursday/Friday).

## 2023-03-24 NOTE — H&P (View-Only) (Signed)
 Synopsis: Referred in for pulmonary infiltrate by Cannady, Jolene T, NP  Assessment & Plan:   #Pulmonary Infiltrate #Tree-in-Bud nodularity  Patient is presenting for the evaluation of a lingular infiltrate with mass like consolidation, tree-in-bud nodularity, as well as bronchial wall thickening. Patient reports an associated cough as well as some chills at night.  Atypical, fungal, and mycobacterial infectious etiologies are very high on my differential for [name]'s symptoms, and she would benefit from flexible bronchoscopy for bronchoalveolar lavage as well as transbronchial biopsies for pathology. I have discussed this with [name] during today's visit and we will proceed in arranging bronchoscopy. She is on Eliquis for her Afib which she will need to hold for 48 hours prior to the procedure.  -proceed with flexible bronchoscopy, airway survey, BAL, and transbronchial biopys  Return in about 2 months (around 05/24/2023).  I spent 60 minutes caring for this patient today, including preparing to see the patient, obtaining a medical history , reviewing a separately obtained history, performing a medically appropriate examination and/or evaluation, counseling and educating the patient/family/caregiver, ordering medications, tests, or procedures, documenting clinical information in the electronic health record, and independently interpreting results (not separately reported/billed) and communicating results to the patient/family/caregiver  Shandy Vi, MD [facility] 03/24/2023 10:36 AM    End of visit medications:  No orders of the defined types were placed in this encounter.    Current Outpatient Medications:    acetaminophen (TYLENOL) 500 MG tablet, Take 1,000 mg by mouth in the morning and at bedtime., Disp: , Rfl:    albuterol (PROAIR HFA) 108 (90 Base) MCG/ACT inhaler, Inhale 2 puffs into the lungs every 6 (six) hours as needed for wheezing or  shortness of breath., Disp: 18 g, Rfl: 4   apixaban (ELIQUIS) 5 MG TABS tablet, Take 1 tablet (5 mg total) by mouth 2 (two) times daily., Disp: 60 tablet, Rfl: 1   ascorbic acid (VITAMIN C) 500 MG tablet, Take 500 mg by mouth daily., Disp: , Rfl:    atorvastatin (LIPITOR) 80 MG tablet, Take 1 tablet (80 mg total) by mouth daily., Disp: 90 tablet, Rfl: 4   baclofen (LIORESAL) 10 MG tablet, Take 0.5 tablets (5 mg total) by mouth 3 (three) times daily., Disp: 135 each, Rfl: 4   Buprenorphine HCl (BELBUCA) 300 MCG FILM, Place 1 Film inside cheek every 8 (eight) hours., Disp: , Rfl:    busPIRone (BUSPAR) 10 MG tablet, Take 2 tablets (20 mg total) by mouth 3 (three) times daily., Disp: 180 tablet, Rfl: 4   Calcium Citrate-Vitamin D (CALCIUM + D PO), Take 1,000 mg by mouth daily., Disp: , Rfl:    Cholecalciferol (VITAMIN D) 50 MCG (2000 UT) CAPS, Take by mouth., Disp: , Rfl:    collagenase (SANTYL) 250 UNIT/GM ointment, Apply 1 Application topically daily., Disp: 15 g, Rfl: 4   furosemide (LASIX) 20 MG tablet, Take 1 tablet (20 mg total) by mouth daily., Disp: 90 tablet, Rfl: 4   gabapentin (NEURONTIN) 800 MG tablet, Take 1 tablet (800 mg total) by mouth in the morning and at bedtime., Disp: 180 tablet, Rfl: 4   hydrOXYzine (ATARAX) 10 MG tablet, Take 1 tablet (10 mg total) by mouth 3 (three) times daily as needed., Disp: 270 tablet, Rfl: 4   Melatonin 10 MG TABS, Take 1 tablet by mouth at bedtime., Disp: , Rfl:    Multiple Vitamins-Minerals (MULTIVITAMIN WITH MINERALS) tablet, Take 1 tablet by mouth daily., Disp: , Rfl:      omeprazole (PRILOSEC) 40 MG capsule, Take 1 capsule (40 mg total) by mouth daily., Disp: 90 capsule, Rfl: 4   potassium chloride SA (KLOR-CON M) 20 MEQ tablet, Take 1 tablet (20 mEq total) by mouth daily., Disp: 90 tablet, Rfl: 4   sotalol (BETAPACE) 80 MG tablet, Take 80 mg by mouth 2 (two) times daily., Disp: , Rfl:    Tiotropium Bromide Monohydrate (SPIRIVA RESPIMAT) 2.5 MCG/ACT  AERS, INHALE 2 SPRAY(S) BY MOUTH ONCE DAILY, Disp: 4 g, Rfl: 6   traZODone (DESYREL) 50 MG tablet, Take 0.5-1 tablets (25-50 mg total) by mouth at bedtime as needed for sleep., Disp: 30 tablet, Rfl: 3   venlafaxine XR (EFFEXOR-XR) 150 MG 24 hr capsule, Take 1 capsule (150 mg total) by mouth daily with breakfast., Disp: 90 capsule, Rfl: 4   vitamin B-12 (CYANOCOBALAMIN) 1000 MCG tablet, Take 1,000 mcg by mouth daily., Disp: , Rfl:    Semaglutide-Weight Management (WEGOVY) 2.4 MG/0.75ML SOAJ, Inject 2.4 mg into the skin once a week. (Patient not taking: Reported on 03/24/2023), Disp: 3 mL, Rfl: 4   Subjective:   PATIENT ID: [name] GENDER: female DOB: 01/28/1956, MRN: [id number]  Chief Complaint  Patient presents with   pulmonary consult    CT 01/21/2023- occ prod cough and SOB with exertion.     HPI  Patient is a pleasant 67 year old female with a history of afib (on Eliquis) and presumed COPD (on Spiriva) who presents to clinic for the evaluation of findings noted on LDCT for lung cancer screening.  Patient reports some exertional dyspnea that is at baseline, as well as a cough that is on and off. She is unable to tell me what color phlegm she brings up, but denies any hemoptysis. She's had some weight loss that was intentional (was on Ozempic, lost 100 lbs). She reports chills, more so over the last week, but denies fevers or night sweats. She reports some pleuritic chest pain over the left side.  Patient has been enrolled in lung cancer screening and was note to have a left lingular infiltrate with tree in bud nodularity that have increased over the past few scans. The last two CT's from November of 2023 and February of 2024 showed a nodular and mass-like areas of consolidation in the lingula. On my review, there are tree-in-bud opacities in that area as well.  Patient is a current smoker and has smoked for around 50 years, with 50 pack years of smoking history. 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 She lives at home with her son, and has lived in her house for over 20 years. She has a crawl space, no basement, no mold, no hot tub, and no Jacuzzi. Water source is municipal. No report of previous infections.  Ancillary information including prior medications, full medical/surgical/family/social histories, and PFTs (when available) are listed below and have been reviewed.   Review of Systems  Constitutional:  Positive for chills and weight loss. Negative for fever and malaise/fatigue.  Respiratory:  Positive for cough, sputum production and shortness of breath. Negative for hemoptysis and wheezing.   Cardiovascular:  Negative for chest pain and palpitations.  Psychiatric/Behavioral:  Positive for depression.      Objective:   Vitals:   03/24/23 1018  BP: 126/72  Pulse: 72  Temp: 98 F (36.7 C)  TempSrc: Temporal  SpO2: 95%  Weight: 188 lb 9.6 oz (85.5 kg)  Height: 5' 3" (1.6 m)   95% on RA BMI Readings   from Last 3 Encounters:  03/24/23 33.41 kg/m  03/17/23 32.34 kg/m  01/12/23 32.60 kg/m   Wt Readings from Last 3 Encounters:  03/24/23 188 lb 9.6 oz (85.5 kg)  03/17/23 188 lb 8 oz (85.5 kg)  01/12/23 190 lb (86.2 kg)    Physical Exam Constitutional:      General: She is not in acute distress.    Appearance: She is not ill-appearing.  HENT:     Head: Normocephalic.     Mouth/Throat:     Mouth: Mucous membranes are moist.  Cardiovascular:     Rate and Rhythm: Normal rate and regular rhythm.     Pulses: Normal pulses.     Heart sounds: Normal heart sounds.  Pulmonary:     Effort: Pulmonary effort is normal.     Breath sounds: No wheezing, rhonchi or rales.  Abdominal:     General: There is distension.     Palpations: Abdomen is soft.     Hernia: A hernia is present.  Neurological:     General: No focal deficit present.     Mental Status: She is alert and oriented to person, place, and time. Mental status is at baseline.       Ancillary  Information    Past Medical History:  Diagnosis Date   Arthritis    COPD (chronic obstructive pulmonary disease)    Enlarged heart    GERD (gastroesophageal reflux disease)    Hiatal hernia    Hx of degenerative disc disease    Hyperlipidemia    Hypertension    Mass    On adrenal gland   Monocytosis    Osteoporosis    Psoriasis    Sleep apnea      Family History  Problem Relation Age of Onset   COPD Father    Heart disease Father    Hyperlipidemia Father    Hypertension Father    Dementia Father    Cancer Sister    Hyperlipidemia Sister    Hypertension Sister    Lymphoma Sister    Migraines Son    Stroke Maternal Grandfather    Heart attack Brother    Cancer Brother        colorectal and liver   Breast cancer Paternal Aunt    Breast cancer Paternal Aunt      Past Surgical History:  Procedure Laterality Date   ABDOMINAL HYSTERECTOMY     ACHILLES TENDON REPAIR     Removed bone spur and repaired achilles tendon   APPENDECTOMY     bone fusion in neck     COLONOSCOPY WITH PROPOFOL N/A 01/17/2019   Procedure: COLONOSCOPY WITH PROPOFOL;  Surgeon: Anna, Kiran, MD;  Location: [facility] ENDOSCOPY;  Service: Gastroenterology;  Laterality: N/A;   HERNIA REPAIR     has not had repair   TONSILLECTOMY      Social History   Socioeconomic History   Marital status: Widowed    Spouse name: Not on file   Number of children: Not on file   Years of education: Not on file   Highest education level: Not on file  Occupational History   Occupation: disabled  Tobacco Use   Smoking status: Every Day    Packs/day: 1.00    Years: 48.00    Additional pack years: 0.00    Total pack years: 48.00    Types: Cigarettes   Smokeless tobacco: Never  Vaping Use   Vaping Use: Never used  Substance and Sexual Activity     Alcohol use: No    Alcohol/week: 0.0 standard drinks of alcohol   Drug use: No   Sexual activity: Never    Partners: Male  Other Topics Concern   Not on file   Social History Narrative   Not on file   Social Determinants of Health   Financial Resource Strain: Low Risk  (01/12/2023)   Overall Financial Resource Strain (CARDIA)    Difficulty of Paying Living Expenses: Not hard at all  Food Insecurity: No Food Insecurity (01/12/2023)   Hunger Vital Sign    Worried About Running Out of Food in the Last Year: Never true    Ran Out of Food in the Last Year: Never true  Transportation Needs: No Transportation Needs (01/12/2023)   PRAPARE - Transportation    Lack of Transportation (Medical): No    Lack of Transportation (Non-Medical): No  Physical Activity: Inactive (01/12/2023)   Exercise Vital Sign    Days of Exercise per Week: 0 days    Minutes of Exercise per Session: 0 min  Stress: No Stress Concern Present (01/12/2023)   [employer] - Occupational Stress Questionnaire    Feeling of Stress : Only a little  Social Connections: Socially Isolated (01/12/2023)   Social Connection and Isolation Panel [NHANES]    Frequency of Communication with Friends and Family: More than three times a week    Frequency of Social Gatherings with Friends and Family: More than three times a week    Attends Religious Services: Never    Active Member of Clubs or Organizations: No    Attends Club or Organization Meetings: Never    Marital Status: Widowed  Intimate Partner Violence: Not At Risk (01/12/2023)   Humiliation, Afraid, Rape, and Kick questionnaire    Fear of Current or Ex-Partner: No    Emotionally Abused: No    Physically Abused: No    Sexually Abused: No     Allergies  Allergen Reactions   Iodinated Contrast Media Rash    Had rash present to neck with PET scan contrast     CBC    Component Value Date/Time   WBC 9.3 03/17/2023 1529   WBC 15.3 (H) 04/15/2022 0648   RBC 3.85 03/17/2023 1529   RBC 4.33 04/15/2022 0648   HGB 11.0 (L) 03/17/2023 1529   HCT 35.2 03/17/2023 1529   PLT 236 03/17/2023 1529   MCV 91  03/17/2023 1529   MCV 93 03/23/2014 1851   MCH 28.6 03/17/2023 1529   MCH 28.9 04/15/2022 0648   MCHC 31.3 (L) 03/17/2023 1529   MCHC 32.3 04/15/2022 0648   RDW 14.0 03/17/2023 1529   RDW 14.7 (H) 03/23/2014 1851   LYMPHSABS 3.7 (H) 03/17/2023 1529   MONOABS 1.2 (H) 04/14/2022 1454   EOSABS 0.2 03/17/2023 1529   BASOSABS 0.1 03/17/2023 1529    Pulmonary Functions Testing Results:     No data to display          Outpatient Medications Prior to Visit  Medication Sig Dispense Refill   acetaminophen (TYLENOL) 500 MG tablet Take 1,000 mg by mouth in the morning and at bedtime.     albuterol (PROAIR HFA) 108 (90 Base) MCG/ACT inhaler Inhale 2 puffs into the lungs every 6 (six) hours as needed for wheezing or shortness of breath. 18 g 4   apixaban (ELIQUIS) 5 MG TABS tablet Take 1 tablet (5 mg total) by mouth 2 (two) times daily. 60 tablet 1   ascorbic acid (VITAMIN C)   500 MG tablet Take 500 mg by mouth daily.     atorvastatin (LIPITOR) 80 MG tablet Take 1 tablet (80 mg total) by mouth daily. 90 tablet 4   baclofen (LIORESAL) 10 MG tablet Take 0.5 tablets (5 mg total) by mouth 3 (three) times daily. 135 each 4   Buprenorphine HCl (BELBUCA) 300 MCG FILM Place 1 Film inside cheek every 8 (eight) hours.     busPIRone (BUSPAR) 10 MG tablet Take 2 tablets (20 mg total) by mouth 3 (three) times daily. 180 tablet 4   Calcium Citrate-Vitamin D (CALCIUM + D PO) Take 1,000 mg by mouth daily.     Cholecalciferol (VITAMIN D) 50 MCG (2000 UT) CAPS Take by mouth.     collagenase (SANTYL) 250 UNIT/GM ointment Apply 1 Application topically daily. 15 g 4   furosemide (LASIX) 20 MG tablet Take 1 tablet (20 mg total) by mouth daily. 90 tablet 4   gabapentin (NEURONTIN) 800 MG tablet Take 1 tablet (800 mg total) by mouth in the morning and at bedtime. 180 tablet 4   hydrOXYzine (ATARAX) 10 MG tablet Take 1 tablet (10 mg total) by mouth 3 (three) times daily as needed. 270 tablet 4   Melatonin 10 MG TABS  Take 1 tablet by mouth at bedtime.     Multiple Vitamins-Minerals (MULTIVITAMIN WITH MINERALS) tablet Take 1 tablet by mouth daily.     omeprazole (PRILOSEC) 40 MG capsule Take 1 capsule (40 mg total) by mouth daily. 90 capsule 4   potassium chloride SA (KLOR-CON M) 20 MEQ tablet Take 1 tablet (20 mEq total) by mouth daily. 90 tablet 4   sotalol (BETAPACE) 80 MG tablet Take 80 mg by mouth 2 (two) times daily.     Tiotropium Bromide Monohydrate (SPIRIVA RESPIMAT) 2.5 MCG/ACT AERS INHALE 2 SPRAY(S) BY MOUTH ONCE DAILY 4 g 6   traZODone (DESYREL) 50 MG tablet Take 0.5-1 tablets (25-50 mg total) by mouth at bedtime as needed for sleep. 30 tablet 3   venlafaxine XR (EFFEXOR-XR) 150 MG 24 hr capsule Take 1 capsule (150 mg total) by mouth daily with breakfast. 90 capsule 4   vitamin B-12 (CYANOCOBALAMIN) 1000 MCG tablet Take 1,000 mcg by mouth daily.     Semaglutide-Weight Management (WEGOVY) 2.4 MG/0.75ML SOAJ Inject 2.4 mg into the skin once a week. (Patient not taking: Reported on 03/24/2023) 3 mL 4   No facility-administered medications prior to visit.   

## 2023-03-24 NOTE — Progress Notes (Signed)
Synopsis: Referred in for pulmonary infiltrate by Kathleen Skiff, NP  Assessment & Plan:   #Pulmonary Infiltrate #Tree-in-Bud nodularity  Patient is presenting for the evaluation of a lingular infiltrate with mass like consolidation, tree-in-bud nodularity, as well as bronchial wall thickening. Patient reports an associated cough as well as some chills at night.  Atypical, fungal, and mycobacterial infectious etiologies are very high on my differential for [name]'s symptoms, and she would benefit from flexible bronchoscopy for bronchoalveolar lavage as well as transbronchial biopsies for pathology. I have discussed this with [name] during today's visit and we will proceed in arranging bronchoscopy. She is on Eliquis for her Afib which she will need to hold for 48 hours prior to the procedure.  -proceed with flexible bronchoscopy, airway survey, BAL, and transbronchial biopys  Return in about 2 months (around 05/24/2023).  I spent 60 minutes caring for this patient today, including preparing to see the patient, obtaining a medical history , reviewing a separately obtained history, performing a medically appropriate examination and/or evaluation, counseling and educating the patient/family/caregiver, ordering medications, tests, or procedures, documenting clinical information in the electronic health record, and independently interpreting results (not separately reported/billed) and communicating results to the patient/family/caregiver  Kathleen Chute, MD [facility] 03/24/2023 10:36 AM    End of visit medications:  No orders of the defined types were placed in this encounter.    Current Outpatient Medications:    acetaminophen (TYLENOL) 500 MG tablet, Take 1,000 mg by mouth in the morning and at bedtime., Disp: , Rfl:    albuterol (PROAIR HFA) 108 (90 Base) MCG/ACT inhaler, Inhale 2 puffs into the lungs every 6 (six) hours as needed for wheezing or  shortness of breath., Disp: 18 g, Rfl: 4   apixaban (ELIQUIS) 5 MG TABS tablet, Take 1 tablet (5 mg total) by mouth 2 (two) times daily., Disp: 60 tablet, Rfl: 1   ascorbic acid (VITAMIN C) 500 MG tablet, Take 500 mg by mouth daily., Disp: , Rfl:    atorvastatin (LIPITOR) 80 MG tablet, Take 1 tablet (80 mg total) by mouth daily., Disp: 90 tablet, Rfl: 4   baclofen (LIORESAL) 10 MG tablet, Take 0.5 tablets (5 mg total) by mouth 3 (three) times daily., Disp: 135 each, Rfl: 4   Buprenorphine HCl (BELBUCA) 300 MCG FILM, Place 1 Film inside cheek every 8 (eight) hours., Disp: , Rfl:    busPIRone (BUSPAR) 10 MG tablet, Take 2 tablets (20 mg total) by mouth 3 (three) times daily., Disp: 180 tablet, Rfl: 4   Calcium Citrate-Vitamin D (CALCIUM + D PO), Take 1,000 mg by mouth daily., Disp: , Rfl:    Cholecalciferol (VITAMIN D) 50 MCG (2000 UT) CAPS, Take by mouth., Disp: , Rfl:    collagenase (SANTYL) 250 UNIT/GM ointment, Apply 1 Application topically daily., Disp: 15 g, Rfl: 4   furosemide (LASIX) 20 MG tablet, Take 1 tablet (20 mg total) by mouth daily., Disp: 90 tablet, Rfl: 4   gabapentin (NEURONTIN) 800 MG tablet, Take 1 tablet (800 mg total) by mouth in the morning and at bedtime., Disp: 180 tablet, Rfl: 4   hydrOXYzine (ATARAX) 10 MG tablet, Take 1 tablet (10 mg total) by mouth 3 (three) times daily as needed., Disp: 270 tablet, Rfl: 4   Melatonin 10 MG TABS, Take 1 tablet by mouth at bedtime., Disp: , Rfl:    Multiple Vitamins-Minerals (MULTIVITAMIN WITH MINERALS) tablet, Take 1 tablet by mouth daily., Disp: , Rfl:  omeprazole (PRILOSEC) 40 MG capsule, Take 1 capsule (40 mg total) by mouth daily., Disp: 90 capsule, Rfl: 4   potassium chloride SA (KLOR-CON M) 20 MEQ tablet, Take 1 tablet (20 mEq total) by mouth daily., Disp: 90 tablet, Rfl: 4   sotalol (BETAPACE) 80 MG tablet, Take 80 mg by mouth 2 (two) times daily., Disp: , Rfl:    Tiotropium Bromide Monohydrate (SPIRIVA RESPIMAT) 2.5 MCG/ACT  AERS, INHALE 2 SPRAY(S) BY MOUTH ONCE DAILY, Disp: 4 g, Rfl: 6   traZODone (DESYREL) 50 MG tablet, Take 0.5-1 tablets (25-50 mg total) by mouth at bedtime as needed for sleep., Disp: 30 tablet, Rfl: 3   venlafaxine XR (EFFEXOR-XR) 150 MG 24 hr capsule, Take 1 capsule (150 mg total) by mouth daily with breakfast., Disp: 90 capsule, Rfl: 4   vitamin B-12 (CYANOCOBALAMIN) 1000 MCG tablet, Take 1,000 mcg by mouth daily., Disp: , Rfl:    Semaglutide-Weight Management (WEGOVY) 2.4 MG/0.75ML SOAJ, Inject 2.4 mg into the skin once a week. (Patient not taking: Reported on 03/24/2023), Disp: 3 mL, Rfl: 4   Subjective:   PATIENT ID: [name] GENDER: female DOB: 1956/03/13, MRN: [id number]  Chief Complaint  Patient presents with   pulmonary consult    CT 01/21/2023- occ prod cough and SOB with exertion.     HPI  Patient is a pleasant 67 year old female with a history of afib (on Eliquis) and presumed COPD (on Spiriva) who presents to clinic for the evaluation of findings noted on LDCT for lung cancer screening.  Patient reports some exertional dyspnea that is at baseline, as well as a cough that is on and off. She is unable to tell me what color phlegm she brings up, but denies any hemoptysis. She's had some weight loss that was intentional (was on Ozempic, lost 100 lbs). She reports chills, more so over the last week, but denies fevers or night sweats. She reports some pleuritic chest pain over the left side.  Patient has been enrolled in lung cancer screening and was note to have a left lingular infiltrate with tree in bud nodularity that have increased over the past few scans. The last two CT's from November of 2023 and February of 2024 showed a nodular and mass-like areas of consolidation in the lingula. On my review, there are tree-in-bud opacities in that area as well.  Patient is a current smoker and has smoked for around 50 years, with 50 pack years of smoking history. She used to work in a  [employer]. She lives at home with her son, and has lived in her house for over 20 years. She has a crawl space, no basement, no mold, no hot tub, and no Jacuzzi. Water source is municipal. No report of previous infections.  Ancillary information including prior medications, full medical/surgical/family/social histories, and PFTs (when available) are listed below and have been reviewed.   Review of Systems  Constitutional:  Positive for chills and weight loss. Negative for fever and malaise/fatigue.  Respiratory:  Positive for cough, sputum production and shortness of breath. Negative for hemoptysis and wheezing.   Cardiovascular:  Negative for chest pain and palpitations.  Psychiatric/Behavioral:  Positive for depression.      Objective:   Vitals:   03/24/23 1018  BP: 126/72  Pulse: 72  Temp: 98 F (36.7 C)  TempSrc: Temporal  SpO2: 95%  Weight: 188 lb 9.6 oz (85.5 kg)  Height:  (1.6 m)   95% on RA BMI Readings  from Last 3 Encounters:  03/24/23 33.41 kg/m  03/17/23 32.34 kg/m  01/12/23 32.60 kg/m   Wt Readings from Last 3 Encounters:  03/24/23 188 lb 9.6 oz (85.5 kg)  03/17/23 188 lb 8 oz (85.5 kg)  01/12/23 190 lb (86.2 kg)    Physical Exam Constitutional:      General: She is not in acute distress.    Appearance: She is not ill-appearing.  HENT:     Head: Normocephalic.     Mouth/Throat:     Mouth: Mucous membranes are moist.  Cardiovascular:     Rate and Rhythm: Normal rate and regular rhythm.     Pulses: Normal pulses.     Heart sounds: Normal heart sounds.  Pulmonary:     Effort: Pulmonary effort is normal.     Breath sounds: No wheezing, rhonchi or rales.  Abdominal:     General: There is distension.     Palpations: Abdomen is soft.     Hernia: A hernia is present.  Neurological:     General: No focal deficit present.     Mental Status: She is alert and oriented to person, place, and time. Mental status is at baseline.       Ancillary  Information    Past Medical History:  Diagnosis Date   Arthritis    COPD (chronic obstructive pulmonary disease)    Enlarged heart    GERD (gastroesophageal reflux disease)    Hiatal hernia    Hx of degenerative disc disease    Hyperlipidemia    Hypertension    Mass    On adrenal gland   Monocytosis    Osteoporosis    Psoriasis    Sleep apnea      Family History  Problem Relation Age of Onset   COPD Father    Heart disease Father    Hyperlipidemia Father    Hypertension Father    Dementia Father    Cancer Sister    Hyperlipidemia Sister    Hypertension Sister    Lymphoma Sister    Migraines Son    Stroke Maternal Grandfather    Heart attack Brother    Cancer Brother        colorectal and liver   Breast cancer Paternal Aunt    Breast cancer Paternal Aunt      Past Surgical History:  Procedure Laterality Date   ABDOMINAL HYSTERECTOMY     ACHILLES TENDON REPAIR     Removed bone spur and repaired achilles tendon   APPENDECTOMY     bone fusion in neck     COLONOSCOPY WITH PROPOFOL N/A 01/17/2019   Procedure: COLONOSCOPY WITH PROPOFOL;  Surgeon: Wyline Mood, MD;  Location: [facility] ENDOSCOPY;  Service: Gastroenterology;  Laterality: N/A;   HERNIA REPAIR     has not had repair   TONSILLECTOMY      Social History   Socioeconomic History   Marital status: Widowed    Spouse name: Not on file   Number of children: Not on file   Years of education: Not on file   Highest education level: Not on file  Occupational History   Occupation: disabled  Tobacco Use   Smoking status: Every Day    Packs/day: 1.00    Years: 48.00    Additional pack years: 0.00    Total pack years: 48.00    Types: Cigarettes   Smokeless tobacco: Never  Vaping Use   Vaping Use: Never used  Substance and Sexual Activity  Alcohol use: No    Alcohol/week: 0.0 standard drinks of alcohol   Drug use: No   Sexual activity: Never    Partners: Male  Other Topics Concern   Not on file   Social History Narrative   Not on file   Social Determinants of Health   Financial Resource Strain: Low Risk  (01/12/2023)   Overall Financial Resource Strain (CARDIA)    Difficulty of Paying Living Expenses: Not hard at all  Food Insecurity: No Food Insecurity (01/12/2023)   Hunger Vital Sign    Worried About Running Out of Food in the Last Year: Never true    Ran Out of Food in the Last Year: Never true  Transportation Needs: No Transportation Needs (01/12/2023)   PRAPARE - Administrator, Civil Service (Medical): No    Lack of Transportation (Non-Medical): No  Physical Activity: Inactive (01/12/2023)   Exercise Vital Sign    Days of Exercise per Week: 0 days    Minutes of Exercise per Session: 0 min  Stress: No Stress Concern Present (01/12/2023)   [employer] of Occupational Health - Occupational Stress Questionnaire    Feeling of Stress : Only a little  Social Connections: Socially Isolated (01/12/2023)   Social Connection and Isolation Panel [NHANES]    Frequency of Communication with Friends and Family: More than three times a week    Frequency of Social Gatherings with Friends and Family: More than three times a week    Attends Religious Services: Never    Database administrator or Organizations: No    Attends Banker Meetings: Never    Marital Status: Widowed  Intimate Partner Violence: Not At Risk (01/12/2023)   Humiliation, Afraid, Rape, and Kick questionnaire    Fear of Current or Ex-Partner: No    Emotionally Abused: No    Physically Abused: No    Sexually Abused: No     Allergies  Allergen Reactions   Iodinated Contrast Media Rash    Had rash present to neck with PET scan contrast     CBC    Component Value Date/Time   WBC 9.3 03/17/2023 1529   WBC 15.3 (H) 04/15/2022 0648   RBC 3.85 03/17/2023 1529   RBC 4.33 04/15/2022 0648   HGB 11.0 (L) 03/17/2023 1529   HCT 35.2 03/17/2023 1529   PLT 236 03/17/2023 1529   MCV 91  03/17/2023 1529   MCV 93 03/23/2014 1851   MCH 28.6 03/17/2023 1529   MCH 28.9 04/15/2022 0648   MCHC 31.3 (L) 03/17/2023 1529   MCHC 32.3 04/15/2022 0648   RDW 14.0 03/17/2023 1529   RDW 14.7 (H) 03/23/2014 1851   LYMPHSABS 3.7 (H) 03/17/2023 1529   MONOABS 1.2 (H) 04/14/2022 1454   EOSABS 0.2 03/17/2023 1529   BASOSABS 0.1 03/17/2023 1529    Pulmonary Functions Testing Results:     No data to display          Outpatient Medications Prior to Visit  Medication Sig Dispense Refill   acetaminophen (TYLENOL) 500 MG tablet Take 1,000 mg by mouth in the morning and at bedtime.     albuterol (PROAIR HFA) 108 (90 Base) MCG/ACT inhaler Inhale 2 puffs into the lungs every 6 (six) hours as needed for wheezing or shortness of breath. 18 g 4   apixaban (ELIQUIS) 5 MG TABS tablet Take 1 tablet (5 mg total) by mouth 2 (two) times daily. 60 tablet 1   ascorbic acid (VITAMIN C)  500 MG tablet Take 500 mg by mouth daily.     atorvastatin (LIPITOR) 80 MG tablet Take 1 tablet (80 mg total) by mouth daily. 90 tablet 4   baclofen (LIORESAL) 10 MG tablet Take 0.5 tablets (5 mg total) by mouth 3 (three) times daily. 135 each 4   Buprenorphine HCl (BELBUCA) 300 MCG FILM Place 1 Film inside cheek every 8 (eight) hours.     busPIRone (BUSPAR) 10 MG tablet Take 2 tablets (20 mg total) by mouth 3 (three) times daily. 180 tablet 4   Calcium Citrate-Vitamin D (CALCIUM + D PO) Take 1,000 mg by mouth daily.     Cholecalciferol (VITAMIN D) 50 MCG (2000 UT) CAPS Take by mouth.     collagenase (SANTYL) 250 UNIT/GM ointment Apply 1 Application topically daily. 15 g 4   furosemide (LASIX) 20 MG tablet Take 1 tablet (20 mg total) by mouth daily. 90 tablet 4   gabapentin (NEURONTIN) 800 MG tablet Take 1 tablet (800 mg total) by mouth in the morning and at bedtime. 180 tablet 4   hydrOXYzine (ATARAX) 10 MG tablet Take 1 tablet (10 mg total) by mouth 3 (three) times daily as needed. 270 tablet 4   Melatonin 10 MG TABS  Take 1 tablet by mouth at bedtime.     Multiple Vitamins-Minerals (MULTIVITAMIN WITH MINERALS) tablet Take 1 tablet by mouth daily.     omeprazole (PRILOSEC) 40 MG capsule Take 1 capsule (40 mg total) by mouth daily. 90 capsule 4   potassium chloride SA (KLOR-CON M) 20 MEQ tablet Take 1 tablet (20 mEq total) by mouth daily. 90 tablet 4   sotalol (BETAPACE) 80 MG tablet Take 80 mg by mouth 2 (two) times daily.     Tiotropium Bromide Monohydrate (SPIRIVA RESPIMAT) 2.5 MCG/ACT AERS INHALE 2 SPRAY(S) BY MOUTH ONCE DAILY 4 g 6   traZODone (DESYREL) 50 MG tablet Take 0.5-1 tablets (25-50 mg total) by mouth at bedtime as needed for sleep. 30 tablet 3   venlafaxine XR (EFFEXOR-XR) 150 MG 24 hr capsule Take 1 capsule (150 mg total) by mouth daily with breakfast. 90 capsule 4   vitamin B-12 (CYANOCOBALAMIN) 1000 MCG tablet Take 1,000 mcg by mouth daily.     Semaglutide-Weight Management (WEGOVY) 2.4 MG/0.75ML SOAJ Inject 2.4 mg into the skin once a week. (Patient not taking: Reported on 03/24/2023) 3 mL 4   No facility-administered medications prior to visit.

## 2023-03-25 NOTE — Telephone Encounter (Signed)
Phone pre admit visit 5/2 between 8-1 and covid test 04/07/2023 at 9:00a.   Lm for patient.

## 2023-03-26 NOTE — Telephone Encounter (Signed)
Lm x2 for patient. Will call once more due to nature of call.   

## 2023-03-26 NOTE — Telephone Encounter (Signed)
Patient is aware of below dates and times and voiced her understanding. Nothing further needed.

## 2023-03-26 NOTE — Telephone Encounter (Signed)
Pt. Calling back to speak to nurse

## 2023-03-31 ENCOUNTER — Other Ambulatory Visit: Payer: Self-pay

## 2023-03-31 ENCOUNTER — Emergency Department
Admission: EM | Admit: 2023-03-31 | Discharge: 2023-03-31 | Discharge status: Against Medical Advice | Payer: PPO | Attending: Emergency Medicine | Admitting: Emergency Medicine

## 2023-03-31 ENCOUNTER — Encounter: Payer: Self-pay | Admitting: Nurse Practitioner

## 2023-03-31 ENCOUNTER — Ambulatory Visit: Payer: Self-pay

## 2023-03-31 DIAGNOSIS — R1012 Left upper quadrant pain: Secondary | ICD-10-CM | POA: Insufficient documentation

## 2023-03-31 DIAGNOSIS — F17200 Nicotine dependence, unspecified, uncomplicated: Secondary | ICD-10-CM | POA: Diagnosis not present

## 2023-03-31 DIAGNOSIS — J449 Chronic obstructive pulmonary disease, unspecified: Secondary | ICD-10-CM | POA: Diagnosis not present

## 2023-03-31 DIAGNOSIS — F410 Panic disorder [episodic paroxysmal anxiety] without agoraphobia: Secondary | ICD-10-CM | POA: Diagnosis not present

## 2023-03-31 DIAGNOSIS — Z5321 Procedure and treatment not carried out due to patient leaving prior to being seen by health care provider: Secondary | ICD-10-CM | POA: Insufficient documentation

## 2023-03-31 LAB — URINALYSIS, ROUTINE W REFLEX MICROSCOPIC
Bilirubin Urine: NEGATIVE
Glucose, UA: NEGATIVE mg/dL
Hgb urine dipstick: NEGATIVE
Ketones, ur: NEGATIVE mg/dL
Leukocytes,Ua: NEGATIVE
Nitrite: NEGATIVE
Protein, ur: NEGATIVE mg/dL
Specific Gravity, Urine: 1.01 (ref 1.005–1.030)
pH: 6 (ref 5.0–8.0)

## 2023-03-31 LAB — COMPREHENSIVE METABOLIC PANEL
ALT: 12 U/L (ref 0–44)
AST: 27 U/L (ref 15–41)
Albumin: 3 g/dL — ABNORMAL LOW (ref 3.5–5.0)
Alkaline Phosphatase: 236 U/L — ABNORMAL HIGH (ref 38–126)
Anion gap: 6 (ref 5–15)
BUN: 13 mg/dL (ref 8–23)
CO2: 26 mmol/L (ref 22–32)
Calcium: 8.8 mg/dL — ABNORMAL LOW (ref 8.9–10.3)
Chloride: 109 mmol/L (ref 98–111)
Creatinine, Ser: 0.87 mg/dL (ref 0.44–1.00)
GFR, Estimated: >60 mL/min (ref >60)
Glucose, Bld: 109 mg/dL — ABNORMAL HIGH (ref 70–99)
Potassium: 3.8 mmol/L (ref 3.5–5.1)
Sodium: 141 mmol/L (ref 135–145)
Total Bilirubin: 0.7 mg/dL (ref 0.3–1.2)
Total Protein: 6.9 g/dL (ref 6.5–8.1)

## 2023-03-31 LAB — CBC WITH DIFFERENTIAL/PLATELET
Abs Immature Granulocytes: 0.03 10*3/uL (ref 0.00–0.07)
Basophils Absolute: 0.1 10*3/uL (ref 0.0–0.1)
Basophils Relative: 1 %
Eosinophils Absolute: 0.2 10*3/uL (ref 0.0–0.5)
Eosinophils Relative: 2 %
HCT: 37.3 % (ref 36.0–46.0)
Hemoglobin: 11.7 g/dL — ABNORMAL LOW (ref 12.0–15.0)
Immature Granulocytes: 0 %
Lymphocytes Relative: 35 %
Lymphs Abs: 3.5 10*3/uL (ref 0.7–4.0)
MCH: 29.3 pg (ref 26.0–34.0)
MCHC: 31.4 g/dL (ref 30.0–36.0)
MCV: 93.5 fL (ref 80.0–100.0)
Monocytes Absolute: 1 10*3/uL (ref 0.1–1.0)
Monocytes Relative: 10 %
Neutro Abs: 5.2 10*3/uL (ref 1.7–7.7)
Neutrophils Relative %: 52 %
Platelets: 269 10*3/uL (ref 150–400)
RBC: 3.99 MIL/uL (ref 3.87–5.11)
RDW: 15.7 % — ABNORMAL HIGH (ref 11.5–15.5)
WBC: 10.1 10*3/uL (ref 4.0–10.5)
nRBC: 0 % (ref 0.0–0.2)

## 2023-03-31 LAB — LIPASE, BLOOD: Lipase: 68 U/L — ABNORMAL HIGH (ref 11–51)

## 2023-03-31 NOTE — ED Triage Notes (Addendum)
Pt to triage via w/c, appears anxious; pt reports she is here for a panic attack; st that she normally takes buspar and hydroxyzine but was unsure if it was time to take it or now; pt is also c/o left upper abd that radiates around into back accomp by swelling to LE(s); st seen by her doctor last week for this pain and is sched for bronchoscopy for COPD; +smoker

## 2023-03-31 NOTE — Telephone Encounter (Signed)
     Chief Complaint: Pt. Having panic attacks, not sleeping. Asking to be worked in today. Symptoms: Above, tearful Frequency: 2-3 weeks Pertinent Negatives: Patient denies any thoughts of self-harm Disposition: [] ED /[] Urgent Care (no appt availability in office) / [] Appointment(In office/virtual)/ []  [facility]/ [] Home Care/ [] Refused Recommended Disposition /[] [facility]/ [x]  Follow-up with PCP Additional Notes: Please advise pt. Practice currently closed for lunch.  Answer Assessment - Initial Assessment Questions 1. CONCERN: "Did anything happen that prompted you to call today?"      Panic attacks 2. ANXIETY SYMPTOMS: "Can you describe how you (your loved one; patient) have been feeling?" (e.g., tense, restless, panicky, anxious, keyed up, overwhelmed, sense of impending doom).      Anxiety 3. ONSET: "How long have you been feeling this way?" (e.g., hours, days, weeks)     2-3 weeks  4. SEVERITY: "How would you rate the level of anxiety?" (e.g., 0 - 10; or mild, moderate, severe).     Severe 5. FUNCTIONAL IMPAIRMENT: "How have these feelings affected your ability to do daily activities?" "Have you had more difficulty than usual doing your normal daily activities?" (e.g., getting better, same, worse; self-care, school, work, interactions)     Not sleeping 6. HISTORY: "Have you felt this way before?" "Have you ever been diagnosed with an anxiety problem in the past?" (e.g., generalized anxiety disorder, panic attacks, PTSD). If Yes, ask: "How was this problem treated?" (e.g., medicines, counseling, etc.)     Yes 7. RISK OF HARM - SUICIDAL IDEATION: "Do you ever have thoughts of hurting or killing yourself?" If Yes, ask:  "Do you have these feelings now?" "Do you have a plan on how you would do this?"     No 8. TREATMENT:  "What has been done so far to treat this anxiety?" (e.g., medicines, relaxation strategies). "What has helped?"     Medication 9. TREATMENT -  THERAPIST: "Do you have a counselor or therapist? Name?"     No 10. POTENTIAL TRIGGERS: "Do you drink caffeinated beverages (e.g., coffee, colas, teas), and how much daily?" "Do you drink alcohol or use any drugs?" "Have you started any new medicines recently?"       No 11. PATIENT SUPPORT: "Who is with you now?" "Who do you live with?" "Do you have family or friends who you can talk to?"        Son 12. OTHER SYMPTOMS: "Do you have any other symptoms?" (e.g., feeling depressed, trouble concentrating, trouble sleeping, trouble breathing, palpitations or fast heartbeat, chest pain, sweating, nausea, or diarrhea)       No 13. PREGNANCY: "Is there any chance you are pregnant?" "When was your last menstrual period?"       No  Protocols used: Anxiety and Panic Attack-A-AH

## 2023-03-31 NOTE — ED Notes (Signed)
Pt states she is going to leave does not want to wait any longer.  Family taking pt home.

## 2023-04-01 ENCOUNTER — Ambulatory Visit: Payer: PPO | Admitting: Physician Assistant

## 2023-04-01 ENCOUNTER — Encounter
Admission: RE | Admit: 2023-04-01 | Discharge: 2023-04-01 | Disposition: A | Discharge status: Home / Self Care | Payer: PPO | Source: Ambulatory Visit | Attending: Student in an Organized Health Care Education/Training Program | Admitting: Student in an Organized Health Care Education/Training Program

## 2023-04-01 HISTORY — DX: Peripheral vascular disease, unspecified: I73.9

## 2023-04-01 HISTORY — DX: Acute pancreatitis without necrosis or infection, unspecified: K85.90

## 2023-04-01 HISTORY — DX: Centrilobular emphysema: J43.2

## 2023-04-01 HISTORY — DX: Vitamin D deficiency, unspecified: E55.9

## 2023-04-01 HISTORY — DX: Fatty (change of) liver, not elsewhere classified: K76.0

## 2023-04-01 HISTORY — DX: Umbilical hernia without obstruction or gangrene: K42.9

## 2023-04-01 HISTORY — DX: Essential tremor: G25.0

## 2023-04-01 HISTORY — DX: Atherosclerosis of aorta: I70.0

## 2023-04-01 HISTORY — DX: Unspecified atrial fibrillation: I48.91

## 2023-04-01 HISTORY — DX: Chronic kidney disease, stage 3a: N18.31

## 2023-04-01 HISTORY — DX: Nicotine dependence, unspecified, uncomplicated: F17.200

## 2023-04-01 HISTORY — DX: Morbid (severe) obesity due to excess calories: E66.01

## 2023-04-01 HISTORY — DX: Other intervertebral disc degeneration, lumbar region without mention of lumbar back pain or lower extremity pain: M51.369

## 2023-04-01 HISTORY — DX: Other specified disorders of adrenal gland: E27.8

## 2023-04-01 HISTORY — DX: Other intervertebral disc degeneration, lumbar region: M51.36

## 2023-04-01 NOTE — Patient Instructions (Signed)
Your procedure is scheduled on:04-09-23 Friday Report to the Registration Desk on the 1st floor of the [facility].Then proceed to the 2nd floor Surgery Desk To find out your arrival time, please call [phone] between 1PM - 3PM on:04-08-23 Thursday If your arrival time is 6:00 am, do not arrive before that time as the [facility] entrance doors do not open until 6:00 am.  REMEMBER: Instructions that are not followed completely may result in serious medical risk, up to and including death; or upon the discretion of your surgeon and anesthesiologist your surgery may need to be rescheduled.  Do not eat food OR drink any liquids after midnight the night before surgery.  No gum chewing or hard candies.  One week prior to surgery: Stop Anti-inflammatories (NSAIDS) such as Advil, Aleve, Ibuprofen, Motrin, Naproxen, Naprosyn and Aspirin based products such as Excedrin, Goody's Powder, BC Powder.You may however, continue to take Tylenol if needed for pain up until the day of surgery. Stop ANY OVER THE COUNTER supplements/vitamins NOW (04-01-23) until after surgery (Vitamin C, Calcium + D, Vitamin D, Multivitamin, Vitamin B12)-Continue your Potassium-You may continue your Melatonin up until the night prior to surgery  Stop your apixaban (ELIQUIS) 2 days prior to surgery-Last dose will be on 04-06-23 Tuesday  TAKE ONLY THESE MEDICATIONS THE MORNING OF SURGERY WITH A SIP OF WATER: -busPIRone (BUSPAR)  -gabapentin (NEURONTIN)  -hydrOXYzine (ATARAX)  -sotalol (BETAPACE)  -venlafaxine XR (EFFEXOR-XR)  -potassium chloride SA (KLOR-CON M)  -omeprazole (PRILOSEC)-take one the night before and one on the morning of surgery - helps to prevent nausea after surgery.)  Continue your furosemide (LASIX) up until the day prior to surgery-Do NOT take the morning of surgery  Use your Albuterol Inhaler the day of surgery and bring your Albuterol Inhaler to the hospital  No Alcohol for 24 hours before or after  surgery.  No Smoking including e-cigarettes for 24 hours before surgery.  No chewable tobacco products for at least 6 hours before surgery.  No nicotine patches on the day of surgery.  Do not use any "recreational" drugs for at least a week (preferably 2 weeks) before your surgery.  Please be advised that the combination of cocaine and anesthesia may have negative outcomes, up to and including death. If you test positive for cocaine, your surgery will be cancelled.  On the morning of surgery brush your teeth with toothpaste and water, you may rinse your mouth with mouthwash if you wish. Do not swallow any toothpaste or mouthwash.  Do not wear jewelry, make-up, hairpins, clips or nail polish.  Do not wear lotions, powders, or perfumes.   Do not shave body hair from the neck down 48 hours before surgery.  Contact lenses, hearing aids and dentures may not be worn into surgery.  Do not bring valuables to the hospital. [facility] is not responsible for any missing/lost belongings or valuables.   Bring your C-PAP to the hospital  Notify your doctor if there is any change in your medical condition (cold, fever, infection).  Wear comfortable clothing (specific to your surgery type) to the hospital.  After surgery, you can help prevent lung complications by doing breathing exercises.  Take deep breaths and cough every 1-2 hours. Your doctor may order a device called an Incentive Spirometer to help you take deep breaths. When coughing or sneezing, hold a pillow firmly against your incision with both hands. This is called "splinting." Doing this helps protect your incision. It also decreases belly discomfort.  If you are being admitted to the hospital overnight, leave your suitcase in the car. After surgery it may be brought to your room.  In case of increased patient census, it may be necessary for you, the patient, to continue your postoperative care in the Same Day Surgery  department.  If you are being discharged the day of surgery, you will not be allowed to drive home. You will need a responsible individual to drive you home and stay with you for 24 hours after surgery.   If you are taking public transportation, you will need to have a responsible individual with you.  Please call the Pre-admissions Testing Dept. at [phone] if you have any questions about these instructions.  Surgery Visitation Policy:  Patients having surgery or a procedure may have two visitors.  Children under the age of 49 must have an adult with them who is not the patient.

## 2023-04-05 ENCOUNTER — Encounter: Payer: Self-pay | Admitting: Nurse Practitioner

## 2023-04-07 ENCOUNTER — Encounter: Payer: Self-pay | Admitting: Student in an Organized Health Care Education/Training Program

## 2023-04-07 ENCOUNTER — Encounter
Admission: RE | Admit: 2023-04-07 | Discharge: 2023-04-07 | Disposition: A | Discharge status: Home / Self Care | Payer: PPO | Source: Ambulatory Visit | Attending: Student in an Organized Health Care Education/Training Program | Admitting: Student in an Organized Health Care Education/Training Program

## 2023-04-07 DIAGNOSIS — Z1152 Encounter for screening for COVID-19: Secondary | ICD-10-CM

## 2023-04-07 NOTE — Progress Notes (Signed)
Perioperative / Anesthesia Services  Pre-Admission Testing Clinical Review / Preoperative Anesthesia Consult  Date: 04/08/23  Patient Demographics:  Name: [name] DOB:   01/13/1956 MRN:   [id number]  Planned Surgical Procedure(s):    Case: [id number] Date/Time: 04/09/23 1230   Procedure: FLEXIBLE BRONCHOSCOPY (Bilateral)   Anesthesia type: General   Pre-op diagnosis: nodule   Location: [facility] PROCEDURE RM 02 / [facility] ORS FOR ANESTHESIA GROUP   Surgeons: Raechel Chute, MD     NOTE: Available PAT nursing documentation and vital signs have been reviewed. Clinical nursing staff has updated patient's PMH/PSHx, current medication list, and drug allergies/intolerances to ensure comprehensive history available to assist in medical decision making as it pertains to the aforementioned surgical procedure and anticipated anesthetic course. Extensive review of available clinical information personally performed. [place] [facility] and PSHx updated with any diagnoses/procedures that  may have been inadvertently omitted during her intake with the pre-admission testing department's nursing staff.  Clinical Discussion:  [name] is a 67 y.o. female who is submitted for pre-surgical anesthesia review and clearance prior to her undergoing the above procedure.Patient is a Current Smoker (48 pack years). Pertinent PMH includes: atrial fibrillation, atypical chest pain, PVD, aortic atherosclerosis, cardiomegaly, HTN, HLD, CKD-III, dyspnea, COPD, OSAH (requires nocturnal PAP therapy), GERD (on daily PPI), hiatal hernia, OA, lumbar DDD, anxiety, insomnia, nicotine dependence.  Patient is followed by cardiology Juliann Pares, MD). She was last seen in the cardiology clinic on 10/12/2022; notes reviewed. At the time of her clinic visit, patient doing well overall from a cardiovascular perspective.  Patient with chronic dyspnea related to her underlying COPD diagnosis and ongoing smoking history. Patient denied any  chest pain, PND, orthopnea, palpitations, significant peripheral edema, weakness, fatigue, vertiginous symptoms, or presyncope/syncope. Patient with a past medical history significant for cardiovascular diagnoses. Documented physical exam was grossly benign, providing no evidence of acute exacerbation and/or decompensation of the patient's known cardiovascular conditions.  Most recent TTE was performed on 04/15/2022 revealed a normal left ventricular systolic function with an EF of 55 to 60%.  There were no regional wall motion abnormalities.  Left ventricular diastolic Doppler parameters were normal.  Right ventricular size and function normal.  There was trivial mitral valve regurgitation.  All transvalvular gradients were noted to be normal providing no evidence suggestive of valvular stenosis.  Myocardial perfusion imaging study was performed on 06/04/2022 revealing a normal left ventricular systolic function with a hyperdynamic LVEF of 67%.  There were no regional wall motion abnormalities.  There was no evidence of stress-induced myocardial ischemia or arrhythmia; no scintigraphic evidence of scar.  Study determined to be normal and low risk.  Patient with an atrial fibrillation diagnosis; CHA2DS2-VASc Score = 4 (age, sex, HTN, vascular disease history). Her rate and rhythm are currently being maintained on oral sotalol. She is chronically anticoagulated using apixaban; reported to be compliant with therapy with no evidence or reports of GI bleeding.  Blood pressure well controlled at 108/62 mmHg on currently prescribed diuretic (furosemide) and beta-blocker (sotalol) therapies. She is on a atorvastatin for her HLD diagnosis and further ASCVD prevention.  Patient is not diabetic.  She does have an OSAH diagnosis and is reported to be compliant with prescribed nocturnal PAP therapy.  Functional capacity somewhat limited by overall respiratory status and multiple medical comorbidities.  With that being  said, patient does not require supplemental oxygen at this time.  She is therefore felt to be able to achieve at least 4 METS of physical  activity without experiencing any significant degrees of angina/anginal equivalent symptoms.  No changes were made to her medication regimen.  Patient to follow-up with outpatient cardiology in 6 months or sooner if needed.  [name] underwent CT imaging of the chest for lung cancer screening on 10/21/2022.  Study revealed a thickening of the lingula with associated atelectasis and numerous new clustered of centrilobular nodules.  Study repeated on 01/21/2023 revealing apparent nodular and mass like areas of consolidation in the lingula, with additional new areas of peribronchovascular consolidation most evident in the medial aspect of the RIGHT lower lobe.  Findings strongly favored to be indicative of an atypical infectious process.  Patient has been seen in consult by pulmonary medicine and is scheduled for a FLEXIBLE BRONCHOSCOPY (Bilateral) on 04/09/2023 with Dr. Raechel Chute, MD.  Given patient's past medical history significant for cardiovascular diagnoses, presurgical cardiac clearance was sought by the PAT team. Per cardiology, "this patient is optimized for surgery and may proceed with the planned procedural course with a LOW risk of significant perioperative cardiovascular complications".   Again, this patient is on daily oral anticoagulation therapy.  She has been instructed on recommendations from her cardiologist for holding her apixaban dose for 2 days prior to her procedure with plans to restart as soon as postoperative bleeding risk felt to be minimized by her primary attending surgeon.  Patient is aware that her last dose of apixaban should be on 04/06/2023.  Patient denies previous perioperative complications with anesthesia in the past. In review of the available records, it is noted that patient underwent a general anesthetic course here at [facility] ([facility]) in 01/19/2019 without documented complications.      03/31/2023    2:46 AM 03/31/2023    2:30 AM 03/24/2023   10:18 AM  Vitals with BMI  Height  5\' 3"  5\' 3"   Weight  188 lbs 188 lbs 10 oz  BMI  33.31 33.42  Systolic 138  126  Diastolic 52  72  Pulse 58  72    Providers/Specialists:   NOTE: Primary physician provider listed below. Patient may have been seen by APP or partner within same practice.   PROVIDER ROLE / SPECIALTY LAST Maryruth Hancock, MD Pulmonary Medicine (Surgeon) 03/24/2023  Marjie Skiff, NP Primary Care Provider 03/17/2023  Rudean Hitt, MD Cardiology 10/12/2022   Allergies:  Iodinated contrast media  Current Home Medications:   No current facility-administered medications for this encounter.    acetaminophen (TYLENOL) 500 MG tablet   albuterol (PROAIR HFA) 108 (90 Base) MCG/ACT inhaler   apixaban (ELIQUIS) 5 MG TABS tablet   ascorbic acid (VITAMIN C) 500 MG tablet   atorvastatin (LIPITOR) 80 MG tablet   baclofen (LIORESAL) 10 MG tablet   Buprenorphine HCl (BELBUCA) 300 MCG FILM   busPIRone (BUSPAR) 10 MG tablet   Calcium Citrate-Vitamin D (CALCIUM + D PO)   Cholecalciferol (VITAMIN D) 50 MCG (2000 UT) CAPS   collagenase (SANTYL) 250 UNIT/GM ointment   furosemide (LASIX) 20 MG tablet   gabapentin (NEURONTIN) 800 MG tablet   hydrOXYzine (ATARAX) 10 MG tablet   Melatonin 10 MG TABS   Multiple Vitamins-Minerals (MULTIVITAMIN WITH MINERALS) tablet   omeprazole (PRILOSEC) 40 MG capsule   potassium chloride SA (KLOR-CON M) 20 MEQ tablet   sotalol (BETAPACE) 80 MG tablet   Tiotropium Bromide Monohydrate (SPIRIVA RESPIMAT) 2.5 MCG/ACT AERS   traZODone (DESYREL) 50 MG tablet   venlafaxine XR (EFFEXOR-XR)  150 MG 24 hr capsule   vitamin B-12 (CYANOCOBALAMIN) 1000 MCG tablet   History:   Past Medical History:  Diagnosis Date   Anxiety    Aortic atherosclerosis (HCC)    Arthritis    Atrial  fibrillation with RVR (HCC)    a.) CHA2DS2VASc = 4 (age, sex, HTN, vascular disease history);  b.) rate/rhythm maintained on oral sotolol; chronically anticoagulated with apixaban   Atypical chest pain    Benign essential tremor    Chronic kidney disease, stage 3a (HCC)    COPD (chronic obstructive pulmonary disease) (HCC)    DDD (degenerative disc disease), cervical    a.) s/p ACDF C6-C7   DDD (degenerative disc disease), lumbar    Enlarged heart    Fatty liver    GERD (gastroesophageal reflux disease)    Hiatal hernia    Hyperlipidemia    Hypertension    Insomnia    a.) takes melatonin PRN   Long term current use of anticoagulant    a.) apixaban   Mass of right adrenal gland (HCC)    a.) CT AP 08/12/2022: measured 3.3 cm   Monocytosis    Morbid obesity (HCC)    Nicotine dependence    OSA on CPAP    Osteoporosis    Pancreatitis    Peripheral vascular disease (HCC)    Psoriasis    SOB (shortness of breath)    Umbilical hernia    Vitamin D deficiency    Past Surgical History:  Procedure Laterality Date   ABDOMINAL HYSTERECTOMY     ACHILLES TENDON REPAIR     Removed bone spur and repaired achilles tendon   APPENDECTOMY     bone fusion in neck     COLONOSCOPY WITH PROPOFOL N/A 01/17/2019   Procedure: COLONOSCOPY WITH PROPOFOL;  Surgeon: Wyline Mood, MD;  Location: [facility] ENDOSCOPY;  Service: Gastroenterology;  Laterality: N/A;   TONSILLECTOMY     Family History  Problem Relation Age of Onset   COPD Father    Heart disease Father    Hyperlipidemia Father    Hypertension Father    Dementia Father    Cancer Sister    Hyperlipidemia Sister    Hypertension Sister    Lymphoma Sister    Migraines Son    Stroke Maternal Grandfather    Heart attack Brother    Cancer Brother        colorectal and liver   Breast cancer Paternal Aunt    Breast cancer Paternal Aunt    Social History   Tobacco Use   Smoking status: Every Day    Packs/day: 1.00    Years: 48.00     Additional pack years: 0.00    Total pack years: 48.00    Types: Cigarettes   Smokeless tobacco: Never  Vaping Use   Vaping Use: Never used  Substance Use Topics   Alcohol use: No    Alcohol/week: 0.0 standard drinks of alcohol   Drug use: No    Pertinent Clinical Results:  LABS:   No visits with results within 3 Day(s) from this visit.  Latest known visit with results is:  Admission on 03/31/2023, Discharged on 03/31/2023  Component Date Value Ref Range Status   WBC 03/31/2023 10.1  4.0 - 10.5 K/uL Final   RBC 03/31/2023 3.99  3.87 - 5.11 MIL/uL Final   Hemoglobin 03/31/2023 11.7 (L)  12.0 - 15.0 g/dL Final   HCT 44/11/270 37.3  36.0 - 46.0 % Final   MCV 03/31/2023  93.5  80.0 - 100.0 fL Final   MCH 03/31/2023 29.3  26.0 - 34.0 pg Final   MCHC 03/31/2023 31.4  30.0 - 36.0 g/dL Final   RDW 16/08/9603 15.7 (H)  11.5 - 15.5 % Final   Platelets 03/31/2023 269  150 - 400 K/uL Final   nRBC 03/31/2023 0.0  0.0 - 0.2 % Final   Neutrophils Relative % 03/31/2023 52  % Final   Neutro Abs 03/31/2023 5.2  1.7 - 7.7 K/uL Final   Lymphocytes Relative 03/31/2023 35  % Final   Lymphs Abs 03/31/2023 3.5  0.7 - 4.0 K/uL Final   Monocytes Relative 03/31/2023 10  % Final   Monocytes Absolute 03/31/2023 1.0  0.1 - 1.0 K/uL Final   Eosinophils Relative 03/31/2023 2  % Final   Eosinophils Absolute 03/31/2023 0.2  0.0 - 0.5 K/uL Final   Basophils Relative 03/31/2023 1  % Final   Basophils Absolute 03/31/2023 0.1  0.0 - 0.1 K/uL Final   Immature Granulocytes 03/31/2023 0  % Final   Abs Immature Granulocytes 03/31/2023 0.03  0.00 - 0.07 K/uL Final   Performed at [facility], [address] [place], [place] [address]   Sodium 03/31/2023 141  135 - 145 mmol/L Final   Potassium 03/31/2023 3.8  3.5 - 5.1 mmol/L Final   Chloride 03/31/2023 109  98 - 111 mmol/L Final   CO2 03/31/2023 26  22 - 32 mmol/L Final   Glucose, Bld 03/31/2023 109 (H)  70 - 99 mg/dL Final   Glucose reference range  applies only to samples taken after fasting for at least 8 hours.   BUN 03/31/2023 13  8 - 23 mg/dL Final   Creatinine, Ser 03/31/2023 0.87  0.44 - 1.00 mg/dL Final   Calcium 11/91/4782 8.8 (L)  8.9 - 10.3 mg/dL Final   Total Protein 95/62/1308 6.9  6.5 - 8.1 g/dL Final   Albumin 65/78/4696 3.0 (L)  3.5 - 5.0 g/dL Final   AST 29/52/8413 27  15 - 41 U/L Final   ALT 03/31/2023 12  0 - 44 U/L Final   Alkaline Phosphatase 03/31/2023 236 (H)  38 - 126 U/L Final   Total Bilirubin 03/31/2023 0.7  0.3 - 1.2 mg/dL Final   GFR, Estimated 03/31/2023 >60  >60 mL/min Final   Comment: (NOTE) Calculated using the CKD-EPI Creatinine Equation (2021)    Anion gap 03/31/2023 6  5 - 15 Final   Performed at [facility], [address] [place], [place] [address]   Lipase 03/31/2023 68 (H)  11 - 51 U/L Final   Performed at [facility], [address] [place], [place] [address]   Color, Urine 03/31/2023 YELLOW (A)  YELLOW Final   APPearance 03/31/2023 CLEAR (A)  CLEAR Final   Specific Gravity, Urine 03/31/2023 1.010  1.005 - 1.030 Final   pH 03/31/2023 6.0  5.0 - 8.0 Final   Glucose, UA 03/31/2023 NEGATIVE  NEGATIVE mg/dL Final   Hgb urine dipstick 03/31/2023 NEGATIVE  NEGATIVE Final   Bilirubin Urine 03/31/2023 NEGATIVE  NEGATIVE Final   Ketones, ur 03/31/2023 NEGATIVE  NEGATIVE mg/dL Final   Protein, ur 36/64/4034 NEGATIVE  NEGATIVE mg/dL Final   Nitrite 74/25/9563 NEGATIVE  NEGATIVE Final   Leukocytes,Ua 03/31/2023 NEGATIVE  NEGATIVE Final   Performed at [facility], [address] [place], [place] [address]    ECG: Date:  Time ECG obtained: 0255 AM Rate: 57 bpm Rhythm: sinus bradycardia Axis (leads I and  aVF): Right axis deviation Intervals: PR 196 ms. QRS 76 ms. QTc 432 ms. ST segment and T wave changes: No evidence of acute ST segment elevation or depression Comparison: Previous tracing obtained on 04/14/2022 showed atrial fibrillation at a rate of 134 bpm  with evidence of a potential old anterior infarct.    IMAGING / PROCEDURES: CT CHEST LCS NODULE F/U LOW DOSE WO CONTRAST performed on 01/21/2023 Today's study is again considered nondiagnostic for purposes of lung cancer screening and is categorized as Lung-RADS 0S, incomplete. Outpatient referral to Pulmonology for further clinical evaluation for presumed untreated progressive atypical infection is recommended. Patient is not considered a suitable candidate for repeat lung cancer screening at this time secondary to the high likelihood of false-positive examinations. The "S" modifier above refers to potentially clinically significant non lung cancer related findings. Specifically, there is aortic atherosclerosis, in addition to left main and three-vessel coronary artery disease. Please note that although the presence of coronary artery calcium documents the presence of coronary artery disease, the severity of this disease and any potential stenosis cannot be assessed on this non-gated CT examination. Assessment for potential risk factor modification, dietary therapy or pharmacologic therapy may be warranted, if clinically indicated. Mild diffuse bronchial wall thickening with mild centrilobular and paraseptal emphysema; imaging findings suggestive of underlying COPD Aortic atherosclerosis   CT ABDOMEN PELVIS WO CONTRAST performed on 08/12/2022 Stable size and appearance of multiple mildly enlarged upper abdominal lymph nodes. No new or enlarging abdominopelvic lymph nodes are seen. No acute abnormality within the abdomen or pelvis. Large umbilical hernia containing non-compromised loops of large and small bowel. No evidence of bowel obstruction. 4. Cholelithiasis without evidence of acute cholecystitis. Stable 3.3 cm right adrenal mass. No follow-up imaging recommended. Subtly nodular hepatic surface contour suggesting underlying cirrhosis. Correlate with liver function tests. Aortic  atherosclerosis  TRANSTHORACIC ECHOCARDIOGRAM performed on 04/15/2022 Left ventricular ejection fraction, by estimation, is 55 to 60%. The left ventricle has normal function. The left ventricle has no regional wall motion abnormalities. Left ventricular diastolic parameters were  normal.  Right ventricular systolic function is normal. The right ventricular size is normal.  The mitral valve is normal in structure. Trivial mitral valve regurgitation.  The aortic valve is grossly normal. Aortic valve regurgitation is not visualized.   Impression and Plan:  TALMADGE DELCONTE has been referred for pre-anesthesia review and clearance prior to her undergoing the planned anesthetic and procedural courses. Available labs, pertinent testing, and imaging results were personally reviewed by me in preparation for upcoming operative/procedural course. [facility] medical record has been updated following extensive record review and patient interview with PAT staff.   This patient has been appropriately cleared by cardiology with an overall LOW risk of significant perioperative cardiovascular complications. Based on clinical review performed today (04/08/23), barring any significant acute changes in the patient's overall condition, it is anticipated that she will be able to proceed with the planned surgical intervention. Any acute changes in clinical condition may necessitate her procedure being postponed and/or cancelled. Patient will meet with anesthesia team (MD and/or CRNA) on the day of her procedure for preoperative evaluation/assessment. Questions regarding anesthetic course will be fielded at that time.   Pre-surgical instructions were reviewed with the patient during her PAT appointment, and questions were fielded to satisfaction by PAT clinical staff. She has been instructed on which medications that she will need to hold prior to surgery, as well as the ones that have been deemed safe/appropriate to take on  the day of her procedure. As part of the general education provided by PAT, patient made aware both verbally and in writing, that she would need to abstain from the use of any illegal substances during her perioperative course.  She was advised that failure to follow the provided instructions could necessitate case cancellation or result in serious perioperative complications up to and including death. Patient encouraged to contact PAT and/or her surgeon's office to discuss any questions or concerns that may arise prior to surgery; verbalized understanding.   Quentin Mulling, MSN, APRN, FNP-C, CEN [facility]  Peri-operative Services Nurse Practitioner Phone: [phone] Fax: [phone] 04/08/23 9:13 AM  NOTE: This note has been prepared using Dragon dictation software. Despite my best ability to proofread, there is always the potential that unintentional transcriptional errors may still occur from this process.

## 2023-04-08 ENCOUNTER — Encounter: Payer: Self-pay | Admitting: Student in an Organized Health Care Education/Training Program

## 2023-04-08 MED ORDER — CHLORHEXIDINE GLUCONATE 0.12 % MT SOLN
15.0000 mL | Freq: Once | OROMUCOSAL | Status: AC
  Administered 2023-04-09: 15 mL via OROMUCOSAL

## 2023-04-08 MED ORDER — ORAL CARE MOUTH RINSE: 15.0000 mL | Freq: Once | OROMUCOSAL | Status: AC

## 2023-04-08 MED ORDER — LACTATED RINGERS IV SOLN: INTRAVENOUS | Status: DC

## 2023-04-09 ENCOUNTER — Encounter
Admission: RE | Disposition: A | Discharge status: Home / Self Care | Payer: Self-pay | Source: Home / Self Care | Attending: Student in an Organized Health Care Education/Training Program

## 2023-04-09 ENCOUNTER — Ambulatory Visit: Payer: PPO | Admitting: Urgent Care

## 2023-04-09 ENCOUNTER — Other Ambulatory Visit: Payer: Self-pay

## 2023-04-09 ENCOUNTER — Ambulatory Visit
Admission: RE | Admit: 2023-04-09 | Discharge: 2023-04-09 | Disposition: A | Discharge status: Home / Self Care | Payer: PPO | Attending: Student in an Organized Health Care Education/Training Program | Admitting: Student in an Organized Health Care Education/Training Program

## 2023-04-09 ENCOUNTER — Encounter: Payer: Self-pay | Admitting: Student in an Organized Health Care Education/Training Program

## 2023-04-09 DIAGNOSIS — K449 Diaphragmatic hernia without obstruction or gangrene: Secondary | ICD-10-CM | POA: Insufficient documentation

## 2023-04-09 DIAGNOSIS — G4730 Sleep apnea, unspecified: Secondary | ICD-10-CM | POA: Insufficient documentation

## 2023-04-09 DIAGNOSIS — Z7901 Long term (current) use of anticoagulants: Secondary | ICD-10-CM | POA: Insufficient documentation

## 2023-04-09 DIAGNOSIS — K219 Gastro-esophageal reflux disease without esophagitis: Secondary | ICD-10-CM | POA: Insufficient documentation

## 2023-04-09 DIAGNOSIS — E785 Hyperlipidemia, unspecified: Secondary | ICD-10-CM | POA: Diagnosis not present

## 2023-04-09 DIAGNOSIS — N1831 Chronic kidney disease, stage 3a: Secondary | ICD-10-CM | POA: Insufficient documentation

## 2023-04-09 DIAGNOSIS — I739 Peripheral vascular disease, unspecified: Secondary | ICD-10-CM | POA: Insufficient documentation

## 2023-04-09 DIAGNOSIS — I1310 Hypertensive heart and chronic kidney disease without heart failure, with stage 1 through stage 4 chronic kidney disease, or unspecified chronic kidney disease: Secondary | ICD-10-CM | POA: Insufficient documentation

## 2023-04-09 DIAGNOSIS — F17210 Nicotine dependence, cigarettes, uncomplicated: Secondary | ICD-10-CM | POA: Diagnosis not present

## 2023-04-09 DIAGNOSIS — F419 Anxiety disorder, unspecified: Secondary | ICD-10-CM | POA: Insufficient documentation

## 2023-04-09 DIAGNOSIS — I700 Atherosclerosis of aorta: Secondary | ICD-10-CM | POA: Diagnosis not present

## 2023-04-09 DIAGNOSIS — M1990 Unspecified osteoarthritis, unspecified site: Secondary | ICD-10-CM | POA: Insufficient documentation

## 2023-04-09 DIAGNOSIS — J449 Chronic obstructive pulmonary disease, unspecified: Secondary | ICD-10-CM | POA: Insufficient documentation

## 2023-04-09 DIAGNOSIS — I4891 Unspecified atrial fibrillation: Secondary | ICD-10-CM | POA: Diagnosis not present

## 2023-04-09 DIAGNOSIS — G709 Myoneural disorder, unspecified: Secondary | ICD-10-CM | POA: Diagnosis not present

## 2023-04-09 DIAGNOSIS — R918 Other nonspecific abnormal finding of lung field: Secondary | ICD-10-CM | POA: Diagnosis not present

## 2023-04-09 DIAGNOSIS — R911 Solitary pulmonary nodule: Secondary | ICD-10-CM | POA: Diagnosis not present

## 2023-04-09 HISTORY — DX: Other cervical disc degeneration, unspecified cervical region: M50.30

## 2023-04-09 HISTORY — DX: Anxiety disorder, unspecified: F41.9

## 2023-04-09 HISTORY — DX: Obstructive sleep apnea (adult) (pediatric): G47.33

## 2023-04-09 HISTORY — DX: Other chest pain: R07.89

## 2023-04-09 HISTORY — DX: Long term (current) use of anticoagulants: Z79.01

## 2023-04-09 HISTORY — PX: FLEXIBLE BRONCHOSCOPY: SHX5094

## 2023-04-09 HISTORY — DX: Shortness of breath: R06.02

## 2023-04-09 HISTORY — DX: Insomnia, unspecified: G47.00

## 2023-04-09 LAB — BODY FLUID CELL COUNT WITH DIFFERENTIAL
Eos, Fluid: 0 %
Lymphs, Fluid: 3 %
Monocyte-Macrophage-Serous Fluid: 1 % — ABNORMAL LOW (ref 50–90)
Neutrophil Count, Fluid: 96 % — ABNORMAL HIGH (ref 0–25)
Total Nucleated Cell Count, Fluid: 2025 cu mm — ABNORMAL HIGH (ref 0–1000)

## 2023-04-09 SURGERY — BRONCHOSCOPY, FLEXIBLE
Anesthesia: General | Laterality: Bilateral

## 2023-04-09 MED ORDER — FENTANYL CITRATE (PF) 100 MCG/2ML IJ SOLN
INTRAMUSCULAR | Status: AC
  Filled 2023-04-09: qty 2

## 2023-04-09 MED ORDER — CHLORHEXIDINE GLUCONATE 0.12 % MT SOLN
OROMUCOSAL | Status: AC
  Filled 2023-04-09: qty 15

## 2023-04-09 MED ORDER — FENTANYL CITRATE (PF) 100 MCG/2ML IJ SOLN
INTRAMUSCULAR | Status: DC | PRN
  Administered 2023-04-09 (×2): 50 ug via INTRAVENOUS

## 2023-04-09 MED ORDER — ONDANSETRON HCL 4 MG/2ML IJ SOLN
INTRAMUSCULAR | Status: DC | PRN
  Administered 2023-04-09: 4 mg via INTRAVENOUS

## 2023-04-09 MED ORDER — GLYCOPYRROLATE 0.2 MG/ML IJ SOLN
INTRAMUSCULAR | Status: DC | PRN
  Administered 2023-04-09: .2 mg via INTRAVENOUS

## 2023-04-09 MED ORDER — MIDAZOLAM HCL 2 MG/2ML IJ SOLN
INTRAMUSCULAR | Status: AC
  Filled 2023-04-09: qty 2

## 2023-04-09 MED ORDER — LIDOCAINE HCL (CARDIAC) PF 100 MG/5ML IV SOSY
PREFILLED_SYRINGE | INTRAVENOUS | Status: DC | PRN
  Administered 2023-04-09: 100 mg via INTRAVENOUS

## 2023-04-09 MED ORDER — PROPOFOL 1000 MG/100ML IV EMUL
INTRAVENOUS | Status: AC
  Filled 2023-04-09: qty 100

## 2023-04-09 MED ORDER — DEXAMETHASONE SODIUM PHOSPHATE 10 MG/ML IJ SOLN
INTRAMUSCULAR | Status: DC | PRN
  Administered 2023-04-09: 10 mg via INTRAVENOUS

## 2023-04-09 MED ORDER — SUCCINYLCHOLINE CHLORIDE 200 MG/10ML IV SOSY
PREFILLED_SYRINGE | INTRAVENOUS | Status: DC | PRN
  Administered 2023-04-09: 80 mg via INTRAVENOUS

## 2023-04-09 MED ORDER — MIDAZOLAM HCL 2 MG/2ML IJ SOLN
INTRAMUSCULAR | Status: DC | PRN
  Administered 2023-04-09: 2 mg via INTRAVENOUS

## 2023-04-09 MED ORDER — PROPOFOL 10 MG/ML IV BOLUS
INTRAVENOUS | Status: DC | PRN
  Administered 2023-04-09: 100 mg via INTRAVENOUS

## 2023-04-09 NOTE — Anesthesia Procedure Notes (Signed)
Procedure Name: Intubation Date/Time: 04/09/2023 12:58 PM  Performed by: Mohammed Kindle, CRNAPre-anesthesia Checklist: Patient identified, Emergency Drugs available, Suction available and Patient being monitored Patient Re-evaluated:Patient Re-evaluated prior to induction Oxygen Delivery Method: Circle system utilized Preoxygenation: Pre-oxygenation with 100% oxygen Induction Type: IV induction Ventilation: Mask ventilation without difficulty Laryngoscope Size: McGraph and 3 Grade View: Grade I Tube type: Oral Tube size: 8.5 mm Number of attempts: 1 Airway Equipment and Method: Stylet and Oral airway Placement Confirmation: ETT inserted through vocal cords under direct vision, positive ETCO2, breath sounds checked- equal and bilateral and CO2 detector Secured at: 21 cm Tube secured with: Tape Dental Injury: Teeth and Oropharynx as per pre-operative assessment

## 2023-04-09 NOTE — Anesthesia Preprocedure Evaluation (Signed)
Anesthesia Evaluation  Patient identified by MRN, date of birth, ID band Patient awake    Reviewed: Allergy & Precautions, NPO status , Patient's Chart, lab work & pertinent test results  History of Anesthesia Complications Negative for: history of anesthetic complications  Airway Mallampati: III  TM Distance: >3 FB Neck ROM: full    Dental  (+) Upper Dentures, Lower Dentures   Pulmonary sleep apnea and Continuous Positive Airway Pressure Ventilation , COPD, Current Smoker    + decreased breath sounds      Cardiovascular Exercise Tolerance: Good hypertension, (-) angina + Peripheral Vascular Disease  (-) Past MI + dysrhythmias Atrial Fibrillation  Rhythm:irregular     Neuro/Psych  PSYCHIATRIC DISORDERS       Neuromuscular disease    GI/Hepatic Neg liver ROS, hiatal hernia,GERD  Controlled,,  Endo/Other  negative endocrine ROS    Renal/GU Renal disease     Musculoskeletal   Abdominal   Peds  Hematology negative hematology ROS (+)   Anesthesia Other Findings Past Medical History: No date: Anxiety No date: Aortic atherosclerosis (HCC) No date: Arthritis No date: Atrial fibrillation with RVR (HCC)     Comment:  a.) CHA2DS2VASc = 4 (age, sex, HTN, vascular disease               history);  b.) rate/rhythm maintained on oral sotolol;               chronically anticoagulated with apixaban No date: Atypical chest pain No date: Benign essential tremor No date: Chronic kidney disease, stage 3a (HCC) No date: COPD (chronic obstructive pulmonary disease) (HCC) No date: DDD (degenerative disc disease), cervical     Comment:  a.) s/p ACDF C6-C7 No date: DDD (degenerative disc disease), lumbar No date: Enlarged heart No date: Fatty liver No date: GERD (gastroesophageal reflux disease) No date: Hiatal hernia No date: Hyperlipidemia No date: Hypertension No date: Insomnia     Comment:  a.) takes melatonin PRN No date:  Long term current use of anticoagulant     Comment:  a.) apixaban No date: Mass of right adrenal gland (HCC)     Comment:  a.) CT AP 08/12/2022: measured 3.3 cm No date: Monocytosis No date: Morbid obesity (HCC) No date: Nicotine dependence No date: OSA on CPAP No date: Osteoporosis No date: Pancreatitis No date: Peripheral vascular disease (HCC) No date: Psoriasis No date: SOB (shortness of breath) No date: Umbilical hernia No date: Vitamin D deficiency  Past Surgical History: No date: ABDOMINAL HYSTERECTOMY No date: ACHILLES TENDON REPAIR     Comment:  Removed bone spur and repaired achilles tendon No date: ANTERIOR CERVICAL DECOMP/DISCECTOMY FUSION No date: APPENDECTOMY 01/17/2019: COLONOSCOPY WITH PROPOFOL; N/A     Comment:  Procedure: COLONOSCOPY WITH PROPOFOL;  Surgeon: Wyline Mood, MD;  Location: [facility] ENDOSCOPY;  Service:               Gastroenterology;  Laterality: N/A; No date: TONSILLECTOMY     Reproductive/Obstetrics negative OB ROS                             Anesthesia Physical Anesthesia Plan  ASA: 3  Anesthesia Plan: General ETT   Post-op Pain Management:    Induction: Intravenous  PONV Risk Score and Plan: Ondansetron, Dexamethasone, Midazolam and Treatment may vary due to age or medical condition  Airway Management Planned: Oral  ETT  Additional Equipment:   Intra-op Plan:   Post-operative Plan: Extubation in OR and Possible Post-op intubation/ventilation  Informed Consent: I have reviewed the patients History and Physical, chart, labs and discussed the procedure including the risks, benefits and alternatives for the proposed anesthesia with the patient or authorized representative who has indicated his/her understanding and acceptance.     Dental Advisory Given  Plan Discussed with: Anesthesiologist, CRNA and Surgeon  Anesthesia Plan Comments: (Patient consented for risks of anesthesia including but  not limited to:  - adverse reactions to medications - damage to eyes, teeth, lips or other oral mucosa - nerve damage due to positioning  - sore throat or hoarseness - Damage to heart, brain, nerves, lungs, other parts of body or loss of life  Patient voiced understanding.)       Anesthesia Quick Evaluation

## 2023-04-09 NOTE — Op Note (Signed)
Bronchoscopy Procedure Note  KAYTLEN DEMETRIUS  [id number]  [birth year]  Date:04/09/23  Time:1:08 PM   Provider Performing:Wilmer Berryhill   Procedure(s):  Flexible bronchoscopy with bronchial alveolar lavage (40981)  Indication(s) Lingular infiltrate, tree in bud opacities  Consent Risks of the procedure as well as the alternatives and risks of each were explained to the patient and/or caregiver.  Consent for the procedure was obtained and is signed in the bedside chart  Anesthesia General, TIVA   Time Out Verified patient identification, verified procedure, site/side was marked, verified correct patient position, special equipment/implants available, medications/allergies/relevant history reviewed, required imaging and test results available.   Sterile Technique Usual hand hygiene, masks, gowns, and gloves were used   Procedure Description Bronchoscope advanced through endotracheal tube and into airway.  Airways were examined down to subsegmental level. There were no endobronchial lesions, and anatomy of the airway appeared normal. There was a moderate amount of thin secretions in the lingula as well as in the RLL. Following diagnostic evaluation, BAL(s) performed in Lingula with normal saline and return of 30 mL of cloud fluid. A washing was performed in the RLL, 90 mL in, 45 mL out. Both samples sent for microbiology and cell count.  Findings: moderate amount of thin secretions. BAL performed in the lingula, washing in the RLL.   Complications/Tolerance None; patient tolerated the procedure well. Chest X-ray is not needed post procedure.   EBL Minimal   Specimen(s) BAL and washing  Raechel Chute, MD [facility] 04/09/2023 1:10 PM

## 2023-04-09 NOTE — Discharge Instructions (Signed)

## 2023-04-09 NOTE — Transfer of Care (Signed)
Immediate Anesthesia Transfer of Care Note  Patient: [name]  Procedure(s) Performed: FLEXIBLE BRONCHOSCOPY (Bilateral)  Patient Location: PACU  Anesthesia Type:General  Level of Consciousness: awake, drowsy, and patient cooperative  Airway & Oxygen Therapy: Patient Spontanous Breathing and Patient connected to face mask oxygen  Post-op Assessment: Report given to RN and Post -op Vital signs reviewed and stable  Post vital signs: Reviewed and stable  Last Vitals:  Vitals Value Taken Time  BP 127/46 04/09/23 1315  Temp    Pulse 70 04/09/23 1316  Resp 17 04/09/23 1316  SpO2 95 % 04/09/23 1316  Vitals shown include unvalidated device data.  Last Pain:  Vitals:   04/09/23 1128  TempSrc: Temporal  PainSc: 5          Complications: No notable events documented.

## 2023-04-09 NOTE — Interval H&P Note (Signed)
Patient presenting with bilateral pulmonary infiltrates, concerning for atypical infection. Plan for bronchoscopy with BAL in the Lingula and in the RLL. Will hold off on transbronchial biopsies at the moment.  Raechel Chute, MD [facility] 04/09/2023 12:15 PM

## 2023-04-09 NOTE — Anesthesia Postprocedure Evaluation (Signed)
Anesthesia Post Note  Patient: [name]  Procedure(s) Performed: FLEXIBLE BRONCHOSCOPY (Bilateral)  Patient location during evaluation: PACU Anesthesia Type: General Level of consciousness: awake and oriented Pain management: satisfactory to patient Vital Signs Assessment: post-procedure vital signs reviewed and stable Respiratory status: spontaneous breathing and nonlabored ventilation Cardiovascular status: stable Anesthetic complications: no   No notable events documented.   Last Vitals:  Vitals:   04/09/23 1358 04/09/23 1359  BP: (!) 126/53   Pulse:    Resp:    Temp:    SpO2:  97%    Last Pain:  Vitals:   04/09/23 1356  TempSrc: Temporal  PainSc: 0-No pain                 VAN STAVEREN,Taheera Thomann

## 2023-04-10 ENCOUNTER — Other Ambulatory Visit: Payer: Self-pay | Admitting: Student in an Organized Health Care Education/Training Program

## 2023-04-10 ENCOUNTER — Encounter: Payer: Self-pay | Admitting: Student in an Organized Health Care Education/Training Program

## 2023-04-10 DIAGNOSIS — A492 Hemophilus influenzae infection, unspecified site: Secondary | ICD-10-CM

## 2023-04-10 LAB — CULTURE, RESPIRATORY W GRAM STAIN

## 2023-04-10 MED ORDER — CEFPODOXIME PROXETIL 200 MG PO TABS
200.0000 mg | ORAL_TABLET | Freq: Two times a day (BID) | ORAL | 0 refills | Status: AC
Start: 2023-04-10 — End: 2023-04-17

## 2023-04-10 NOTE — Progress Notes (Signed)
BAL and Washing cultures are growing H. Influenza, Beta lactamase positive. Will send a prescription for Cefpodoxime, 200 mg orally twice daily, to the patient's pharmacy. I called the patient and updated her of this result.  Raechel Chute, MD [facility] 04/10/2023 6:25 PM

## 2023-04-11 LAB — ACID FAST SMEAR (AFB, MYCOBACTERIA)
Acid Fast Smear: NEGATIVE
Acid Fast Smear: NEGATIVE

## 2023-04-11 LAB — CULTURE, RESPIRATORY W GRAM STAIN

## 2023-04-11 NOTE — Patient Instructions (Signed)

## 2023-04-12 ENCOUNTER — Other Ambulatory Visit: Payer: Self-pay | Admitting: Nurse Practitioner

## 2023-04-12 DIAGNOSIS — I739 Peripheral vascular disease, unspecified: Secondary | ICD-10-CM | POA: Diagnosis not present

## 2023-04-12 DIAGNOSIS — J449 Chronic obstructive pulmonary disease, unspecified: Secondary | ICD-10-CM | POA: Diagnosis not present

## 2023-04-12 DIAGNOSIS — E782 Mixed hyperlipidemia: Secondary | ICD-10-CM | POA: Diagnosis not present

## 2023-04-12 DIAGNOSIS — G8929 Other chronic pain: Secondary | ICD-10-CM | POA: Diagnosis not present

## 2023-04-12 DIAGNOSIS — M545 Low back pain, unspecified: Secondary | ICD-10-CM | POA: Diagnosis not present

## 2023-04-12 DIAGNOSIS — K219 Gastro-esophageal reflux disease without esophagitis: Secondary | ICD-10-CM | POA: Diagnosis not present

## 2023-04-12 DIAGNOSIS — Z87891 Personal history of nicotine dependence: Secondary | ICD-10-CM | POA: Diagnosis not present

## 2023-04-12 DIAGNOSIS — J45909 Unspecified asthma, uncomplicated: Secondary | ICD-10-CM | POA: Diagnosis not present

## 2023-04-12 DIAGNOSIS — I4891 Unspecified atrial fibrillation: Secondary | ICD-10-CM | POA: Diagnosis not present

## 2023-04-12 DIAGNOSIS — F411 Generalized anxiety disorder: Secondary | ICD-10-CM | POA: Diagnosis not present

## 2023-04-12 NOTE — Telephone Encounter (Signed)
Unable to refill per protocol, Rx request is too soon. Last refill 03/17/23 for 30 and 3 refills. Patient reported not taking 04/01/23.  Requested Prescriptions  Pending Prescriptions Disp Refills   traZODone (DESYREL) 50 MG tablet [Pharmacy Med Name: TRAZODONE 50 MG TABLET] 90 tablet 2    Sig: TAKE 0.5-1 TABLETS BY MOUTH AT BEDTIME AS NEEDED FOR SLEEP.     Psychiatry: Antidepressants - Serotonin Modulator Passed - 04/12/2023  4:31 AM      Passed - Completed PHQ-2 or PHQ-9 in the last 360 days      Passed - Valid encounter within last 6 months    Recent Outpatient Visits           3 weeks ago Chronic kidney disease, stage 3a (HCC)   [place] [facility] Marquez, [place] T, NP   3 months ago Cellulitis of right lower leg   [place] [facility] [place], [place] T, NP   4 months ago Centrilobular emphysema (HCC)   [place] [facility] [place], Corrie Dandy T, NP   7 months ago Morbid obesity (HCC)   [place] [facility] [place], [place] T, NP   9 months ago Depression, major, single episode, in partial remission (HCC)   [place] [facility] Leonard, Dorie Rank, NP       Future Appointments             In 2 days Marjie Skiff, NP  [facility], [facility]   In 1 month Raechel Chute, MD [facility] Care at [facility]

## 2023-04-13 ENCOUNTER — Other Ambulatory Visit: Payer: Self-pay | Admitting: Nurse Practitioner

## 2023-04-13 LAB — PNEUMOCYSTIS JIROVECI SMEAR BY DFA

## 2023-04-13 NOTE — Telephone Encounter (Signed)
Last RF 12/02/22 #180 4 RF  Requested Prescriptions  Refused Prescriptions Disp Refills   busPIRone (BUSPAR) 10 MG tablet [Pharmacy Med Name: BUSPIRONE HCL 10 MG TABLET] 540 tablet 2    Sig: TAKE 2 TABLETS BY MOUTH 3 TIMES DAILY.     Psychiatry: Anxiolytics/Hypnotics - Non-controlled Passed - 04/13/2023  2:33 PM      Passed - Valid encounter within last 12 months    Recent Outpatient Visits           3 weeks ago Chronic kidney disease, stage 3a (HCC)   [place] [facility] [place], Ellijay T, NP   3 months ago Cellulitis of right lower leg   [place] [facility] [place], [place] T, NP   4 months ago Centrilobular emphysema (HCC)   [place] [facility] [place], Corrie Dandy T, NP   7 months ago Morbid obesity (HCC)   [place] [facility] [place], [place] T, NP   9 months ago Depression, major, single episode, in partial remission [facility])   [place] [facility] [place], Dorie Rank, NP       Future Appointments             Tomorrow Marjie Skiff, NP [place] [facility], [facility]   In 1 month Raechel Chute, MD [facility] Pulmonary Care at [facility]

## 2023-04-14 ENCOUNTER — Encounter: Payer: Self-pay | Admitting: Nurse Practitioner

## 2023-04-14 ENCOUNTER — Ambulatory Visit (INDEPENDENT_AMBULATORY_CARE_PROVIDER_SITE_OTHER): Payer: PPO | Admitting: Nurse Practitioner

## 2023-04-14 VITALS — BP 129/62 | HR 65 | Temp 98.5°F | Ht 64.02 in | Wt 189.5 lb

## 2023-04-14 DIAGNOSIS — S91002A Unspecified open wound, left ankle, initial encounter: Secondary | ICD-10-CM | POA: Diagnosis not present

## 2023-04-14 DIAGNOSIS — I739 Peripheral vascular disease, unspecified: Secondary | ICD-10-CM

## 2023-04-14 NOTE — Progress Notes (Signed)
BP 129/62   Pulse 65   Temp 98.5 F (36.9 C) (Oral)   Ht 5' 4.02" (1.626 m)   Wt 189 lb 8 oz (86 kg)   LMP  (LMP Unknown)   SpO2 95%   BMI 32.51 kg/m    Subjective:    Patient ID: [name], female    DOB: Nov 21, 1956, 67 y.o.   MRN: [id number]  HPI: MALAYNA TEST is a 67 y.o. female  Chief Complaint  Patient presents with   Wound Check    Left lateral ankle.   WOUND CHECK Follow-up today for wound check, noted to left lateral ankle on 03/17/23, had been present for 2 months at that time after hitting area against her walker.  Has underlying PVD.  Santyl sent in to place on wound.  Overall is improving at this time with Santyl.   Duration: months Location: left lateral ankle History of trauma in area: yes Pain: yes Quality: yes Severity: mild Redness: yes improved Swelling: no Oozing: no Pus: no Fevers: no Nausea/vomiting: no Status: better Treatments attempted: Santyl Tetanus: UTD   Relevant past medical, surgical, family and social history reviewed and updated as indicated. Interim medical history since our last visit reviewed. Allergies and medications reviewed and updated.  Review of Systems  Constitutional:  Negative for activity change, appetite change, diaphoresis, fatigue and fever.  HENT:  Negative for ear discharge and ear pain.   Respiratory:  Negative for cough, chest tightness, shortness of breath and wheezing.   Cardiovascular:  Negative for chest pain, palpitations and leg swelling.  Gastrointestinal: Negative.   Skin:  Positive for wound.  Neurological: Negative.   Psychiatric/Behavioral:  Negative for decreased concentration, self-injury, sleep disturbance and suicidal ideas. The patient is not nervous/anxious.     Per HPI unless specifically indicated above     Objective:    BP 129/62   Pulse 65   Temp 98.5 F (36.9 C) (Oral)   Ht 5' 4.02" (1.626 m)   Wt 189 lb 8 oz (86 kg)   LMP  (LMP Unknown)   SpO2 95%   BMI 32.51 kg/m    Wt Readings from Last 3 Encounters:  04/14/23 189 lb 8 oz (86 kg)  03/31/23 188 lb (85.3 kg)  03/24/23 188 lb 9.6 oz (85.5 kg)    Physical Exam Vitals and nursing note reviewed.  Constitutional:      General: She is awake. She is not in acute distress.    Appearance: She is well-developed. She is obese. She is not ill-appearing.  HENT:     Head: Normocephalic.     Right Ear: Hearing, tympanic membrane, ear canal and external ear normal.     Left Ear: Hearing, tympanic membrane, ear canal and external ear normal.  Eyes:     General: Lids are normal.        Right eye: No discharge.        Left eye: No discharge.     Conjunctiva/sclera: Conjunctivae normal.     Pupils: Pupils are equal, round, and reactive to light.  Neck:     Vascular: No carotid bruit.  Cardiovascular:     Rate and Rhythm: Normal rate and regular rhythm.     Pulses:          Dorsalis pedis pulses are 1+ on the right side and 1+ on the left side.       Posterior tibial pulses are 1+ on the right side and 1+ on the  left side.     Heart sounds: Normal heart sounds. No murmur heard.    No gallop.     Comments: Diminished hair pattern BLE. Pulmonary:     Effort: Pulmonary effort is normal. No accessory muscle usage or respiratory distress.     Breath sounds: Wheezing present. No decreased breath sounds or rhonchi.     Comments: Occasional wheezes throughout, but overall improved sounds on exam today. Abdominal:     General: Bowel sounds are normal.     Palpations: Abdomen is soft.  Musculoskeletal:     Cervical back: Normal range of motion and neck supple.     Right lower leg: 1+ Edema present.     Left lower leg: 1+ Edema present.  Skin:    General: Skin is warm and dry.       Neurological:     Mental Status: She is alert and oriented to person, place, and time.  Psychiatric:        Attention and Perception: Attention normal.        Mood and Affect: Mood normal.        Speech: Speech normal.         Behavior: Behavior normal. Behavior is cooperative.        Thought Content: Thought content normal.     Results for orders placed or performed during the hospital encounter of 04/09/23  Culture, Respiratory w Gram Stain   Specimen: Bronchoalveolar Lavage; Respiratory  Result Value Ref Range   Specimen Description      BRONCHIAL ALVEOLAR LAVAGE LEFT LINGULA Performed at [facility], [address] [place], [place] [address]    Special Requests      NONE Performed at [facility], [address] [place], [place] [address]    Gram Stain      FEW WBC PRESENT, PREDOMINANTLY PMN NO ORGANISMS SEEN    Culture      ABUNDANT HAEMOPHILUS INFLUENZAE BETA LACTAMASE POSITIVE Performed at [facility], [address], [place], [place] [address]    Report Status 04/11/2023 FINAL   Acid Fast Smear (AFB)   Specimen: Bronchoalveolar Lavage; Respiratory  Result Value Ref Range   AFB Specimen Processing Concentration    Acid Fast Smear Negative    Source (AFB) BRONCHIAL ALVEOLAR LAVAGE   Pneumocystis smear by DFA   Specimen: Bronchoalveolar Lavage; Respiratory  Result Value Ref Range   Specimen Source-[facility] BRONCHIAL ALVEOLAR LAVAGE    Pneumocystis jiroveci Ag See Scanned report in [facility]   Culture, Respiratory w Gram Stain   Specimen: Bronchoalveolar Lavage; Respiratory  Result Value Ref Range   Specimen Description      BRONCHIAL ALVEOLAR LAVAGE RIGHT Performed at [facility], [address] [place], [place] [address]    Special Requests R/O NOCARDIA AND ACTINOMYCES    Gram Stain      RARE WBC PRESENT,BOTH PMN AND MONONUCLEAR NO ORGANISMS SEEN    Culture      RARE HAEMOPHILUS INFLUENZAE BETA LACTAMASE POSITIVE Performed at [facility], [address], [place], [place] [address]    Report Status 04/11/2023 FINAL   Acid Fast Smear (AFB)   Specimen: Bronchial Washing, Right; Respiratory  Result Value Ref Range   AFB  Specimen Processing Concentration    Acid Fast Smear Negative    Source (AFB) BRONCHIAL ALVEOLAR LAVAGE   Body fluid cell count with differential  Result Value Ref Range   Fluid Type-FCT BRONCHIAL ALVEOLAR LAVAGE  Color, Fluid COLORLESS    Appearance, Fluid CLOUDY (A) CLEAR   Total Nucleated Cell Count, Fluid 2,025 (H) 0 - 1,000 cu mm   Neutrophil Count, Fluid 96 (H) 0 - 25 %   Lymphs, Fluid 3 %   Monocyte-Macrophage-Serous Fluid 1 (L) 50 - 90 %   Eos, Fluid 0 %      Assessment & Plan:   Problem List Items Addressed This Visit       Cardiovascular and Mediastinum   PVD (peripheral vascular disease) (HCC) - Primary    Chronic, evidenced by decreased hair pattern and baseline edema.  Is long time smoker, recommend complete cessation.  Consider vascular visit in future, especially with present wound if any worsening, currently is improving.  At this time monitor skin closely for wounds and wear compression hose daily.        Other   Wound of left ankle    To left lateral ankle for two months after hitting against walker -- poorly healing due to underlying PVD.  Seeing improvement today with daily Santyl use, continue this.  May need to send to vascular for assessment if any worsening.  Return in 3 weeks.        Follow up plan: Return in about 3 weeks (around 05/05/2023) for WOUND CHECK.

## 2023-04-14 NOTE — Assessment & Plan Note (Signed)
To left lateral ankle for two months after hitting against walker -- poorly healing due to underlying PVD.  Seeing improvement today with daily Santyl use, continue this.  May need to send to vascular for assessment if any worsening.  Return in 3 weeks.

## 2023-04-14 NOTE — Assessment & Plan Note (Signed)
Chronic, evidenced by decreased hair pattern and baseline edema.  Is long time smoker, recommend complete cessation.  Consider vascular visit in future, especially with present wound if any worsening, currently is improving.  At this time monitor skin closely for wounds and wear compression hose daily.

## 2023-04-16 LAB — FUNGUS CULTURE WITH STAIN

## 2023-04-28 ENCOUNTER — Encounter: Payer: Self-pay | Admitting: Nurse Practitioner

## 2023-05-11 ENCOUNTER — Other Ambulatory Visit: Payer: Self-pay | Admitting: Nurse Practitioner

## 2023-05-12 NOTE — Telephone Encounter (Signed)
Requested Prescriptions  Pending Prescriptions Disp Refills   busPIRone (BUSPAR) 10 MG tablet [Pharmacy Med Name: BUSPIRONE HCL 10 MG TABLET] 540 tablet 1    Sig: TAKE 2 TABLETS BY MOUTH 3 TIMES DAILY.     Psychiatry: Anxiolytics/Hypnotics - Non-controlled Passed - 05/11/2023 11:30 AM      Passed - Valid encounter within last 12 months    Recent Outpatient Visits           4 weeks ago PVD (peripheral vascular disease) (HCC)   [place] [facility] [place], [place] T, NP   1 month ago Chronic kidney disease, stage 3a (HCC)   [place] [facility] [place], Corrie Dandy T, NP   4 months ago Cellulitis of right lower leg   [place] [facility] [place], [place] T, NP   5 months ago Centrilobular emphysema (HCC)   [place] [facility] [place], Corrie Dandy T, NP   8 months ago Morbid obesity (HCC)   [facility] [place], Dorie Rank, NP       Future Appointments             In 5 days Marjie Skiff, NP [place] [facility], [facility]   In 1 week Raechel Chute, MD [facility]monary Care at [facility]

## 2023-05-13 LAB — FUNGUS CULTURE RESULT

## 2023-05-13 LAB — FUNGAL ORGANISM REFLEX

## 2023-05-13 LAB — FUNGUS CULTURE WITH STAIN

## 2023-05-16 NOTE — Patient Instructions (Incomplete)

## 2023-05-17 ENCOUNTER — Ambulatory Visit (INDEPENDENT_AMBULATORY_CARE_PROVIDER_SITE_OTHER): Payer: PPO | Admitting: Nurse Practitioner

## 2023-05-17 ENCOUNTER — Encounter: Payer: Self-pay | Admitting: Nurse Practitioner

## 2023-05-17 VITALS — BP 138/72 | HR 61 | Temp 98.2°F | Ht 64.02 in | Wt 187.8 lb

## 2023-05-17 DIAGNOSIS — Z79899 Other long term (current) drug therapy: Secondary | ICD-10-CM | POA: Diagnosis not present

## 2023-05-17 DIAGNOSIS — K439 Ventral hernia without obstruction or gangrene: Secondary | ICD-10-CM | POA: Diagnosis not present

## 2023-05-17 DIAGNOSIS — I739 Peripheral vascular disease, unspecified: Secondary | ICD-10-CM | POA: Diagnosis not present

## 2023-05-17 DIAGNOSIS — S91002D Unspecified open wound, left ankle, subsequent encounter: Secondary | ICD-10-CM | POA: Diagnosis not present

## 2023-05-17 DIAGNOSIS — K429 Umbilical hernia without obstruction or gangrene: Secondary | ICD-10-CM

## 2023-05-17 DIAGNOSIS — F419 Anxiety disorder, unspecified: Secondary | ICD-10-CM

## 2023-05-17 DIAGNOSIS — F324 Major depressive disorder, single episode, in partial remission: Secondary | ICD-10-CM

## 2023-05-17 MED ORDER — CLONAZEPAM 0.5 MG PO TABS: 0.5000 mg | ORAL_TABLET | Freq: Every day | ORAL | 2 refills | Status: DC | PRN

## 2023-05-17 NOTE — Assessment & Plan Note (Signed)
Chronic, ongoing with occasional discomfort now with weight loss.  Will place referral to general surgery per request for recommendations and assessment.

## 2023-05-17 NOTE — Assessment & Plan Note (Signed)
Overall closed at this time, continue to monitor and she is to alert provider is area opens again or there is any worsening.  At present is healed.

## 2023-05-17 NOTE — Assessment & Plan Note (Signed)
Chronic, ongoing with occasional discomfort now with weight loss.  Will place referral to general surgery per request for recommendations and assessment. 

## 2023-05-17 NOTE — Assessment & Plan Note (Signed)
Chronic, stable at this time. Denies SI/HI.  Continue Effexor XL 150 MG daily + Buspar and Vistaril. She has stopped taking pain medication or seeing pain clinic, last [place] fill for 30 day supply on 03/15/23.  Has been off for one month.  We discussed restart of benzo as needed daily only -- educated at length on risks/benefits and if restart pain medication will need to stop benzo -- can not take both.  She prefers focus on mood due to panic attacks.  Start at lowest dosing, Klonopin 0.5 MG daily as needed only -- take only as needed.  Aware of need for every 3 month visit, yearly drug screen, and controlled substance agreement.  Controlled substance agreement and UDS today.  Consider Prozac, as worked well in past, but this could interact with cardiac medications (QT prolongation) - will not initiate this at this time.  Consider psychiatry referral in future.  Monitor BP with Effexor.

## 2023-05-17 NOTE — Progress Notes (Signed)
BP 138/72 (BP Location: Left Arm, Patient Position: Sitting, Cuff Size: Large)   Pulse 61   Temp 98.2 F (36.8 C) (Oral)   Ht 5' 4.02" (1.626 m)   Wt 187 lb 12.8 oz (85.2 kg)   LMP  (LMP Unknown)   SpO2 94%   BMI 32.22 kg/m    Subjective:    Patient ID: [name], female    DOB: 09-27-1956, 67 y.o.   MRN: [id number]  HPI: [name] is a 67 y.o. female  Chief Complaint  Patient presents with   Wound Check   Med Change Request    Patient would like something for her nerves   Would like a referral to general surgery to discuss repair of ventral and umbilical hernias.  Causes discomfort after her weight loss.  WOUND CHECK Follow-up today for wound check, noted to left lateral ankle on 03/17/23, had been present for 2 months at that time after hitting area against her walker. Has underlying PVD. Santyl sent in to place on wound BID. Overall continues to improved and healing. Duration: months Location: lateral left ankle History of trauma in area: yes Redness:  improving Swelling:  improving Oozing: no Pus: no Fevers: no Nausea/vomiting: no Status: better Treatments attempted: Santyl  ANXIETY/STRESS She reports no longer taking pain medication, Belbuca, and not seeing pain clinic.  Would prefer to get her anxiety under control vs pain -- took benzo in past with benefit (Klonopin) and has been requesting to restart, but was unable to due to pain medication use.  Currently for mood taking Vistaril TID, Effexor XR 150 MG daily, and Buspar 20 MG TID.  Continues to have issues with anxiety and panic attacks.  Last time filled Belbuca was on 03/15/23 -- for 30 day supply.  She has been off of this now for one month. Duration:uncontrolled Anxious mood: yes  Excessive worrying: yes Irritability:  a little bit on occasion   Sweating:  occasional with panic Nausea: no Palpitations:no Hyperventilation:  occasional with panic Panic attacks: yes = 2-3 times a  day Agoraphobia:  does not leave house unless needs to go to appointment   Obscessions/compulsions: no Depressed mood: no    05/17/2023    2:48 PM 04/14/2023    3:47 PM 03/17/2023    3:31 PM 01/12/2023    3:36 PM 12/02/2022    2:57 PM  Depression screen PHQ 2/9  Decreased Interest 2 0 0 0 0  Down, Depressed, Hopeless 0 0 0 0 0  PHQ - 2 Score 2 0 0 0 0  Altered sleeping 2 2 0 0 0  Tired, decreased energy 0 0 0 0 0  Change in appetite 0 0 0 0 0  Feeling bad or failure about yourself  0 0 0 0 0  Trouble concentrating 0 0 0 0 0  Moving slowly or fidgety/restless 0 0 0 0 0  Suicidal thoughts 0 0 0 0 0  PHQ-9 Score 4 2 0 0 0  Difficult doing work/chores   Not difficult at all Not difficult at all Not difficult at all  Anhedonia: no Weight changes: no Insomnia: yes hard to stay asleep  Hypersomnia: no Fatigue/loss of energy: no Feelings of worthlessness: no Feelings of guilt: no Impaired concentration/indecisiveness: no Suicidal ideations: no  Crying spells: yes Recent Stressors/Life Changes: no   Relationship problems: no   Family stress: no     Financial stress: no    Job stress: no    Recent  death/loss: no     05/17/2023    2:48 PM 04/14/2023    3:47 PM 03/17/2023    3:32 PM 12/02/2022    2:58 PM  GAD 7 : Generalized Anxiety Score  Nervous, Anxious, on Edge 3 2 3  0  Control/stop worrying 0 0 0 0  Worry too much - different things 0 0 0 0  Trouble relaxing 3 2 3  0  Restless 0 2 0 0  Easily annoyed or irritable 0 0 0 0  Afraid - awful might happen 0 0 0 0  Total GAD 7 Score 6 6 6  0  Anxiety Difficulty  Not difficult at all Not difficult at all Not difficult at all   Relevant past medical, surgical, family and social history reviewed and updated as indicated. Interim medical history since our last visit reviewed. Allergies and medications reviewed and updated.  Review of Systems  Constitutional:  Negative for activity change, appetite change, diaphoresis, fatigue and fever.   HENT:  Negative for ear discharge and ear pain.   Respiratory:  Negative for cough, chest tightness, shortness of breath and wheezing.   Cardiovascular:  Negative for chest pain, palpitations and leg swelling.  Gastrointestinal: Negative.   Skin:  Positive for wound.  Neurological: Negative.   Psychiatric/Behavioral:  Positive for sleep disturbance. Negative for decreased concentration, self-injury and suicidal ideas. The patient is nervous/anxious.    Per HPI unless specifically indicated above     Objective:    BP 138/72 (BP Location: Left Arm, Patient Position: Sitting, Cuff Size: Large)   Pulse 61   Temp 98.2 F (36.8 C) (Oral)   Ht 5' 4.02" (1.626 m)   Wt 187 lb 12.8 oz (85.2 kg)   LMP  (LMP Unknown)   SpO2 94%   BMI 32.22 kg/m   Wt Readings from Last 3 Encounters:  05/17/23 187 lb 12.8 oz (85.2 kg)  04/14/23 189 lb 8 oz (86 kg)  03/31/23 188 lb (85.3 kg)    Physical Exam Vitals and nursing note reviewed.  Constitutional:      General: She is awake. She is not in acute distress.    Appearance: She is well-developed. She is obese. She is not ill-appearing.  HENT:     Head: Normocephalic.     Right Ear: Hearing, tympanic membrane, ear canal and external ear normal.     Left Ear: Hearing, tympanic membrane, ear canal and external ear normal.  Eyes:     General: Lids are normal.        Right eye: No discharge.        Left eye: No discharge.     Conjunctiva/sclera: Conjunctivae normal.     Pupils: Pupils are equal, round, and reactive to light.  Neck:     Vascular: No carotid bruit.  Cardiovascular:     Rate and Rhythm: Normal rate and regular rhythm.     Pulses:          Dorsalis pedis pulses are 1+ on the right side and 1+ on the left side.       Posterior tibial pulses are 1+ on the right side and 1+ on the left side.     Heart sounds: Normal heart sounds. No murmur heard.    No gallop.     Comments: Diminished hair pattern BLE. Pulmonary:     Effort:  Pulmonary effort is normal. No accessory muscle usage or respiratory distress.     Breath sounds: No decreased breath sounds, wheezing or  rhonchi.  Abdominal:     General: Bowel sounds are normal.     Palpations: Abdomen is soft.  Musculoskeletal:     Cervical back: Normal range of motion and neck supple.     Right lower leg: 1+ Edema present.     Left lower leg: 1+ Edema present.  Skin:    General: Skin is warm and dry.       Neurological:     Mental Status: She is alert and oriented to person, place, and time.  Psychiatric:        Attention and Perception: Attention normal.        Mood and Affect: Mood normal.        Speech: Speech normal.        Behavior: Behavior normal. Behavior is cooperative.        Thought Content: Thought content normal.     Results for orders placed or performed during the hospital encounter of 04/09/23  Culture, Respiratory w Gram Stain   Specimen: Bronchoalveolar Lavage; Respiratory  Result Value Ref Range   Specimen Description      BRONCHIAL ALVEOLAR LAVAGE LEFT LINGULA Performed at [facility], [address] [place], [place] [address]    Special Requests      NONE Performed at [facility], [address] [place], [place] [address]    Gram Stain      FEW WBC PRESENT, PREDOMINANTLY PMN NO ORGANISMS SEEN    Culture      ABUNDANT HAEMOPHILUS INFLUENZAE BETA LACTAMASE POSITIVE Performed at [facility], [address] [place], [place] [address]    Report Status 04/11/2023 FINAL   Acid Fast Smear (AFB)   Specimen: Bronchoalveolar Lavage; Respiratory  Result Value Ref Range   AFB Specimen Processing Concentration    Acid Fast Smear Negative    Source (AFB) BRONCHIAL ALVEOLAR LAVAGE   Fungus Culture With Stain   Specimen: Bronchial Alveolar Lavage; Respiratory  Result Value Ref Range   Fungus Stain Final report    Fungus (Mycology) Culture Final report    Fungal Source BRONCHIAL ALVEOLAR LAVAGE    Pneumocystis smear by DFA   Specimen: Bronchoalveolar Lavage; Respiratory  Result Value Ref Range   Specimen Source-[facility] BRONCHIAL ALVEOLAR LAVAGE    Pneumocystis jiroveci Ag See Scanned report in [facility]   Culture, Respiratory w Gram Stain   Specimen: Bronchoalveolar Lavage; Respiratory  Result Value Ref Range   Specimen Description      BRONCHIAL ALVEOLAR LAVAGE RIGHT Performed at [facility], [address] [place], [place] [address]    Special Requests R/O NOCARDIA AND ACTINOMYCES    Gram Stain      RARE WBC PRESENT,BOTH PMN AND MONONUCLEAR NO ORGANISMS SEEN    Culture      RARE HAEMOPHILUS INFLUENZAE BETA LACTAMASE POSITIVE Performed at [facility], [address] [place], [place] [address]    Report Status 04/11/2023 FINAL   Acid Fast Smear (AFB)   Specimen: Bronchial Washing, Right; Respiratory  Result Value Ref Range   AFB Specimen Processing Concentration    Acid Fast Smear Negative    Source (AFB) BRONCHIAL ALVEOLAR LAVAGE   Fungus Culture With Stain  Result Value Ref Range   Fungus Stain Final report    Fungus (Mycology) Culture Final report    Fungal Source BRONCHIAL ALVEOLAR LAVAGE   Fungus Culture Result  Result Value Ref Range   Result 1 Comment   Fungus Culture Result  Result Value Ref Range   Result 1 Comment   Fungal organism reflex  Result Value Ref Range   Fungal result 1 Comment   Fungal organism reflex  Result Value Ref Range   Fungal result 1 Comment   Body fluid cell count with differential  Result Value Ref Range   Fluid Type-FCT BRONCHIAL ALVEOLAR LAVAGE    Color, Fluid COLORLESS    Appearance, Fluid CLOUDY (A) CLEAR   Total Nucleated Cell Count, Fluid 2,025 (H) 0 - 1,000 cu mm   Neutrophil Count, Fluid 96 (H) 0 - 25 %   Lymphs, Fluid 3 %   Monocyte-Macrophage-Serous Fluid 1 (L) 50 - 90 %   Eos, Fluid 0 %      Assessment & Plan:   Problem List Items Addressed This Visit       Cardiovascular and  Mediastinum   PVD (peripheral vascular disease) (HCC)    Chronic, evidenced by decreased hair pattern and baseline edema.  Is long time smoker, recommend complete cessation.  Consider vascular visit in future, especially with present wound if any worsening, currently is improving.  At this time monitor skin closely for wounds and wear compression hose daily.        Other   Anxiety    Refer to depression plan of care.      Relevant Orders   [id number] 11+Oxyco+Alc+Crt-Bund   Depression, major, single episode, in partial remission (HCC) - Primary    Chronic, stable at this time. Denies SI/HI.  Continue Effexor XL 150 MG daily + Buspar and Vistaril. She has stopped taking pain medication or seeing pain clinic, last Belbuca fill for 30 day supply on 03/15/23.  Has been off for one month.  We discussed restart of benzo as needed daily only -- educated at length on risks/benefits and if restart pain medication will need to stop benzo -- can not take both.  She prefers focus on mood due to panic attacks.  Start at lowest dosing, Klonopin 0.5 MG daily as needed only -- take only as needed.  Aware of need for every 3 month visit, yearly drug screen, and controlled substance agreement.  Controlled substance agreement and UDS today.  Consider Prozac, as worked well in past, but this could interact with cardiac medications (QT prolongation) - will not initiate this at this time.  Consider psychiatry referral in future.  Monitor BP with Effexor.      Relevant Orders   [id number] 11+Oxyco+Alc+Crt-Bund   Umbilical hernia without obstruction and without gangrene    Chronic, ongoing with occasional discomfort now with weight loss.  Will place referral to general surgery per request for recommendations and assessment.      Relevant Orders   Ambulatory referral to General Surgery   Ventral hernia without obstruction or gangrene    Chronic, ongoing with occasional discomfort now with weight loss.  Will place referral  to general surgery per request for recommendations and assessment.      Relevant Orders   Ambulatory referral to General Surgery   Wound of left ankle    Overall closed at this time, continue to monitor and she is to alert provider is area opens again or there is any worsening.  At present is healed.      Other Visit Diagnoses     High risk medication use       UDS for benzo today.   Relevant Orders   [id number] 11+Oxyco+Alc+Crt-Bund        Follow up plan:  Return in about 3 months (around 08/17/2023) for ANXIETY, COPD, CKD, HTN/HLD.

## 2023-05-17 NOTE — Assessment & Plan Note (Signed)
Refer to depression plan of care. 

## 2023-05-17 NOTE — Assessment & Plan Note (Signed)
Chronic, evidenced by decreased hair pattern and baseline edema.  Is long time smoker, recommend complete cessation.  Consider vascular visit in future, especially with present wound if any worsening, currently is improving.  At this time monitor skin closely for wounds and wear compression hose daily. 

## 2023-05-19 LAB — DRUG SCREEN 764883 11+OXYCO+ALC+CRT-BUND
Amphetamines, Urine: NEGATIVE ng/mL
BENZODIAZ UR QL: NEGATIVE ng/mL
Barbiturate: NEGATIVE ng/mL
Cannabinoid Quant, Ur: NEGATIVE ng/mL
Cocaine (Metabolite): NEGATIVE ng/mL
Creatinine: 20.8 mg/dL (ref 20.0–300.0)
Ethanol: NEGATIVE %
Meperidine: NEGATIVE ng/mL
Methadone Screen, Urine: NEGATIVE ng/mL
OPIATE SCREEN URINE: NEGATIVE ng/mL
Oxycodone/Oxymorphone, Urine: NEGATIVE ng/mL
Phencyclidine: NEGATIVE ng/mL
Propoxyphene: NEGATIVE ng/mL
Tramadol: NEGATIVE ng/mL
pH, Urine: 5.4 (ref 4.5–8.9)

## 2023-05-19 NOTE — Progress Notes (Signed)
Contacted via MyChart   Drug screening all negative.  Great news!!

## 2023-05-19 NOTE — Progress Notes (Unsigned)
Patient ID: [name], female   DOB: [birth year], 67 y.o.   MRN: [id number]  Chief Complaint: Ventral hernia  History of Present Illness [name] is a 67 y.o. female with history of an umbilical hernia over 5 years.  Previously seen and evaluated by surgeon, reported to need weight loss prior to consideration of repair.  She reports she is lost 100 pounds since then.  She still has significant bulging, and is tender and painful at times.  She denies any history of nausea or vomiting or other intestinal obstruction.  She takes Eliquis for atrial fibrillation.  She takes a maintenance Spiriva inhaler for her COPD, not requiring rescue doses.  She uses CPAP at night, and walks with a walker for some gait stability.  She seems to be highly motivated to have her hernia repaired becoming less and less tolerant of his discomfort.  Past Medical History Past Medical History:  Diagnosis Date   Anxiety    Aortic atherosclerosis (HCC)    Arthritis    Atrial fibrillation with RVR (HCC)    a.) CHA2DS2VASc = 4 (age, sex, HTN, vascular disease history);  b.) rate/rhythm maintained on oral sotolol; chronically anticoagulated with apixaban   Atypical chest pain    Benign essential tremor    Chronic kidney disease, stage 3a (HCC)    COPD (chronic obstructive pulmonary disease) (HCC)    DDD (degenerative disc disease), cervical    a.) s/p ACDF C6-C7   DDD (degenerative disc disease), lumbar    Enlarged heart    Fatty liver    GERD (gastroesophageal reflux disease)    Hiatal hernia    Hyperlipidemia    Hypertension    Insomnia    a.) takes melatonin PRN   Long term current use of anticoagulant    a.) apixaban   Mass of right adrenal gland (HCC)    a.) CT AP 08/12/2022: measured 3.3 cm   Monocytosis    Morbid obesity (HCC)    Nicotine dependence    OSA on CPAP    Osteoporosis    Pancreatitis    Peripheral vascular disease (HCC)    Psoriasis    SOB (shortness of breath)    Umbilical  hernia    Vitamin D deficiency       Past Surgical History:  Procedure Laterality Date   ABDOMINAL HYSTERECTOMY     ACHILLES TENDON REPAIR     Removed bone spur and repaired achilles tendon   ANTERIOR CERVICAL DECOMP/DISCECTOMY FUSION     APPENDECTOMY     COLONOSCOPY WITH PROPOFOL N/A 01/17/2019   Procedure: COLONOSCOPY WITH PROPOFOL;  Surgeon: Wyline Mood, MD;  Location: [facility] ENDOSCOPY;  Service: Gastroenterology;  Laterality: N/A;   FLEXIBLE BRONCHOSCOPY Bilateral 04/09/2023   Procedure: FLEXIBLE BRONCHOSCOPY;  Surgeon: Raechel Chute, MD;  Location: [facility] ORS;  Service: Pulmonary;  Laterality: Bilateral;   TONSILLECTOMY      Allergies  Allergen Reactions   Iodinated Contrast Media Rash    Had rash present to neck with PET scan contrast    Current Outpatient Medications  Medication Sig Dispense Refill   acetaminophen (TYLENOL) 500 MG tablet Take 1,000 mg by mouth in the morning and at bedtime.     albuterol (PROAIR HFA) 108 (90 Base) MCG/ACT inhaler Inhale 2 puffs into the lungs every 6 (six) hours as needed for wheezing or shortness of breath. 18 g 4   apixaban (ELIQUIS) 5 MG TABS tablet Take 1 tablet (5 mg total) by mouth 2 (two)  times daily. 60 tablet 1   ascorbic acid (VITAMIN C) 500 MG tablet Take 500 mg by mouth daily.     atorvastatin (LIPITOR) 80 MG tablet Take 1 tablet (80 mg total) by mouth daily. (Patient taking differently: Take 80 mg by mouth at bedtime.) 90 tablet 4   baclofen (LIORESAL) 10 MG tablet Take 0.5 tablets (5 mg total) by mouth 3 (three) times daily. 135 each 4   busPIRone (BUSPAR) 10 MG tablet TAKE 2 TABLETS BY MOUTH 3 TIMES DAILY. 540 tablet 1   Calcium Citrate-Vitamin D (CALCIUM + D PO) Take 1,000 mg by mouth daily.     Cholecalciferol (VITAMIN D) 50 MCG (2000 UT) CAPS Take by mouth.     clonazePAM (KLONOPIN) 0.5 MG tablet Take 1 tablet (0.5 mg total) by mouth daily as needed for anxiety. 30 tablet 2   collagenase (SANTYL) 250 UNIT/GM ointment Apply 1  Application topically daily. (Patient taking differently: Apply 1 Application topically daily. Applies to left ankle) 15 g 4   furosemide (LASIX) 20 MG tablet Take 1 tablet (20 mg total) by mouth daily. (Patient taking differently: Take 20 mg by mouth every morning.) 90 tablet 4   gabapentin (NEURONTIN) 800 MG tablet Take 1 tablet (800 mg total) by mouth in the morning and at bedtime. 180 tablet 4   hydrOXYzine (ATARAX) 10 MG tablet Take 1 tablet (10 mg total) by mouth 3 (three) times daily as needed. (Patient taking differently: Take 10 mg by mouth 3 (three) times daily.) 270 tablet 4   Melatonin 10 MG TABS Take 1 tablet by mouth at bedtime.     Multiple Vitamins-Minerals (MULTIVITAMIN WITH MINERALS) tablet Take 1 tablet by mouth daily.     omeprazole (PRILOSEC) 40 MG capsule Take 1 capsule (40 mg total) by mouth daily. (Patient taking differently: Take 40 mg by mouth every morning.) 90 capsule 4   potassium chloride SA (KLOR-CON M) 20 MEQ tablet Take 1 tablet (20 mEq total) by mouth daily. (Patient taking differently: 20 mEq every morning.) 90 tablet 4   sotalol (BETAPACE) 80 MG tablet Take 80 mg by mouth 2 (two) times daily.     Tiotropium Bromide Monohydrate (SPIRIVA RESPIMAT) 2.5 MCG/ACT AERS INHALE 2 SPRAY(S) BY MOUTH ONCE DAILY (Patient taking differently: 2 each at bedtime. INHALE 2 SPRAY(S) BY MOUTH ONCE DAILY) 4 g 6   venlafaxine XR (EFFEXOR-XR) 150 MG 24 hr capsule Take 1 capsule (150 mg total) by mouth daily with breakfast. 90 capsule 4   vitamin B-12 (CYANOCOBALAMIN) 1000 MCG tablet Take 1,000 mcg by mouth daily.     No current facility-administered medications for this visit.    Family History Family History  Problem Relation Age of Onset   COPD Father    Heart disease Father    Hyperlipidemia Father    Hypertension Father    Dementia Father    Cancer Sister    Hyperlipidemia Sister    Hypertension Sister    Lymphoma Sister    Migraines Son    Stroke Maternal Grandfather     Heart attack Brother    Cancer Brother        colorectal and liver   Breast cancer Paternal Aunt    Breast cancer Paternal Aunt       Social History Social History   Tobacco Use   Smoking status: Every Day    Packs/day: 1.00    Years: 48.00    Additional pack years: 0.00    Total pack years:  48.00    Types: Cigarettes    Passive exposure: Past   Smokeless tobacco: Never  Vaping Use   Vaping Use: Never used  Substance Use Topics   Alcohol use: No    Alcohol/week: 0.0 standard drinks of alcohol   Drug use: No        Review of Systems  Constitutional: Negative.   HENT: Negative.    Eyes: Negative.   Respiratory:  Positive for shortness of breath.   Cardiovascular: Negative.   Gastrointestinal:  Positive for abdominal pain. Negative for constipation, diarrhea, nausea and vomiting.  Genitourinary: Negative.   Skin: Negative.   Neurological: Negative.   Psychiatric/Behavioral:  Positive for depression.      Physical Exam Blood pressure (!) 149/78, pulse 98, temperature 98 F (36.7 C), height 5\' 3"  (1.6 m), weight 189 lb (85.7 kg), SpO2 97 %. Last Weight  Most recent update: 05/20/2023  1:45 PM    Weight  85.7 kg (189 lb)             CONSTITUTIONAL: Well developed, and nourished, appropriately responsive and aware without distress.  Smells of smoke, also lives with her son who smokes. EYES: Sclera non-icteric.   EARS, NOSE, MOUTH AND THROAT:  The oropharynx is clear. Oral mucosa is pink and moist.   Hearing is intact to voice.  NECK: Trachea is midline, and there is no jugular venous distension.  LYMPH NODES:  Lymph nodes in the neck are not appreciated. RESPIRATORY:   Normal respiratory effort without pathologic use of accessory muscles. CARDIOVASCULAR:  Well perfused.  GI: The abdomen is notable for a prominent hernia sac, potentially consistent with loss of domain, however I was able to get her reduced without too much effort and without significant  constraint of her breathing.  Obviously her defect remains on approximated at approximately 9 cm.  Soft, nontender, and nondistended. There were no palpable masses.  I did not appreciate hepatosplenomegaly.  MUSCULOSKELETAL:  Symmetrical muscle tone appreciated in all four extremities.    SKIN: Skin turgor is normal. No pathologic skin lesions appreciated.  NEUROLOGIC:  Motor and sensation appear grossly normal.  Cranial nerves are grossly without defect. PSYCH:  Alert and oriented to person, place and time. Affect is appropriate for situation.  Data Reviewed I have personally reviewed what is currently available of the patient's imaging, recent labs and medical records.   Labs:     Latest Ref Rng & Units 03/31/2023    3:21 AM 03/17/2023    3:29 PM 12/02/2022    2:58 PM  CBC  WBC 4.0 - 10.5 K/uL 10.1  9.3  12.9   Hemoglobin 12.0 - 15.0 g/dL 56.2  13.0  86.5   Hematocrit 36.0 - 46.0 % 37.3  35.2  41.4   Platelets 150 - 400 K/uL 269  236  321       Latest Ref Rng & Units 03/31/2023    3:21 AM 03/17/2023    3:29 PM 12/02/2022    2:58 PM  CMP  Glucose 70 - 99 mg/dL 784  85  94   BUN 8 - 23 mg/dL 13  11  8    Creatinine 0.44 - 1.00 mg/dL 6.96  2.95  2.84   Sodium 135 - 145 mmol/L 141  143  142   Potassium 3.5 - 5.1 mmol/L 3.8  4.3  4.3   Chloride 98 - 111 mmol/L 109  107  102   CO2 22 - 32 mmol/L 26  25  25   Calcium 8.9 - 10.3 mg/dL 8.8  9.0  9.5   Total Protein 6.5 - 8.1 g/dL 6.9  5.9  6.4   Total Bilirubin 0.3 - 1.2 mg/dL 0.7  0.3  0.2   Alkaline Phos 38 - 126 U/L 236  287  189   AST 15 - 41 U/L 27  26  25    ALT 0 - 44 U/L 12  15  11      Imaging: Radiological images reviewed:  CLINICAL DATA:  Lymphadenopathy   EXAM: CT ABDOMEN AND PELVIS WITHOUT CONTRAST   TECHNIQUE: Multidetector CT imaging of the abdomen and pelvis was performed following the standard protocol without IV contrast.   RADIATION DOSE REDUCTION: This exam was performed according to the departmental  dose-optimization program which includes automated exposure control, adjustment of the mA and/or kV according to patient size and/or use of iterative reconstruction technique.   COMPARISON:  PET-CT 04/30/2022, abdominal CT 04/14/2022   FINDINGS: Lower chest: No acute abnormality.   Hepatobiliary: Subtly nodular hepatic surface contour. No focal liver lesion identified on noncontrast CT. Cholelithiasis. No pericholecystic inflammatory changes by CT. No biliary dilatation.   Pancreas: Unremarkable. No pancreatic ductal dilatation or surrounding inflammatory changes.   Spleen: Normal in size without focal abnormality.   Adrenals/Urinary Tract: Stable 3.3 cm right adrenal mass containing calcification. Left adrenal gland is unremarkable. Bilateral kidneys appear normal. No renal stone or hydronephrosis. Urinary bladder within normal limits for the degree of distension.   Stomach/Bowel: Stomach is within normal limits. Appendix is surgically absent. No evidence of bowel wall thickening, distention, or inflammatory changes.   Vascular/Lymphatic: Scattered aortoiliac atherosclerotic calcifications without aneurysm. Multiple mildly enlarged upper abdominal lymph nodes. Reference nodes include 10 mm portacaval node (series 2, image 23), 9 mm gastrohepatic ligament node (series 2, image 21), 12 mm celiac node (series 2, image 14). Nodes have not appreciably changed in size from the previous study. No intrapelvic lymphadenopathy.   Reproductive: Status post hysterectomy. No adnexal masses.   Other: Large wide necked umbilical hernia containing non compromised loops of small bowel, transverse colon, and mesenteric fat and vessels. No ascites. No pneumoperitoneum.   Musculoskeletal: No acute or significant osseous findings. Severe, end-stage degenerative changes of the right hip. Advanced lower thoracic spondylosis. Degenerative disc disease of L4-5.   IMPRESSION: 1. Stable size and  appearance of multiple mildly enlarged upper abdominal lymph nodes. No new or enlarging abdominopelvic lymph nodes are seen. 2. No acute abnormality within the abdomen or pelvis. 3. Large umbilical hernia containing non-compromised loops of large and small bowel. No evidence of bowel obstruction. 4. Cholelithiasis without evidence of acute cholecystitis. 5. Stable 3.3 cm right adrenal mass. No follow-up imaging recommended. 6. Subtly nodular hepatic surface contour suggesting underlying cirrhosis. Correlate with liver function tests. 7. Aortic Atherosclerosis (ICD10-I70.0).     Electronically Signed   By: Duanne Guess D.O.   On: 08/14/2022 08:54 Within last 24 hrs: No results found.  Assessment     Patient Active Problem List   Diagnosis Date Noted   Ventral hernia without obstruction or gangrene 05/17/2023   Pulmonary infiltrate 04/09/2023   Wound of left ankle 03/17/2023   PVD (peripheral vascular disease) (HCC) 12/02/2022   Fatty liver 09/01/2022   Umbilical hernia without obstruction and without gangrene 08/14/2022   Atrial fibrillation with RVR (HCC) 04/14/2022   Osteopenia of neck of left femur 03/07/2022   Mass of right adrenal gland (HCC) 03/04/2021   Monocytosis 02/28/2021  Chronic kidney disease, stage 3a (HCC) 03/04/2020   Aortic atherosclerosis (HCC) 10/03/2019   Prediabetes 09/04/2019   Vitamin D deficiency 12/29/2018   Osteoarthritis of right hip 12/27/2017   Advanced care planning/counseling discussion 06/07/2017   Benign hypertension 10/28/2015   Nicotine dependence, cigarettes, uncomplicated 07/26/2015   Facet syndrome, lumbar 07/11/2015   Spinal stenosis, lumbar region, with neurogenic claudication 07/11/2015   Status post cervical spinal fusion 07/11/2015   Chronic pain 05/17/2015   Anxiety 05/14/2015   Depression, major, single episode, in partial remission (HCC) 05/14/2015   DDD (degenerative disc disease), lumbar 05/14/2015   Centrilobular  emphysema (HCC) 05/14/2015   Hyperlipidemia 05/14/2015   Gastroesophageal reflux disease 05/14/2015   Radiculopathy, lumbar region 05/14/2015   Sleep apnea 05/14/2015   Morbid obesity (HCC) 05/14/2015   Benign essential tremor 05/14/2015    Plan    She reports she has previously quit smoking with use of a graded nicotine patch system, she cannot remember if she spent 2 weeks at each level or 1 week.  We are anticipating follow-up at 6 weeks expecting her to quit smoking in the interim.  Additional weight loss would be helpful.  I believe she understands that.  She understands that we are in the process of attempting to consider her as a candidate for surgery.  Want to encourage her that we remain hopeful.  She had a colonoscopy with polypectomy back in 2020, and likely will be due for follow-up colonoscopy.  Face-to-face time spent with the patient and accompanying care providers(if present) was 40 minutes, with more than 50% of the time spent counseling, educating, and coordinating care of the patient.    These notes generated with voice recognition software. I apologize for typographical errors.  Campbell Lerner M.D., FACS 05/20/2023, 2:23 PM

## 2023-05-20 ENCOUNTER — Ambulatory Visit: Payer: PPO | Admitting: Surgery

## 2023-05-20 ENCOUNTER — Other Ambulatory Visit: Payer: Self-pay | Admitting: Surgery

## 2023-05-20 ENCOUNTER — Encounter: Payer: Self-pay | Admitting: Surgery

## 2023-05-20 VITALS — BP 149/78 | HR 98 | Temp 98.0°F | Ht 63.0 in | Wt 189.0 lb

## 2023-05-20 DIAGNOSIS — J432 Centrilobular emphysema: Secondary | ICD-10-CM

## 2023-05-20 DIAGNOSIS — J449 Chronic obstructive pulmonary disease, unspecified: Secondary | ICD-10-CM | POA: Diagnosis not present

## 2023-05-20 DIAGNOSIS — F17210 Nicotine dependence, cigarettes, uncomplicated: Secondary | ICD-10-CM

## 2023-05-20 DIAGNOSIS — Z86010 Personal history of colonic polyps: Secondary | ICD-10-CM | POA: Insufficient documentation

## 2023-05-20 DIAGNOSIS — K429 Umbilical hernia without obstruction or gangrene: Secondary | ICD-10-CM

## 2023-05-20 NOTE — Progress Notes (Signed)
Referral to GI  

## 2023-05-20 NOTE — Patient Instructions (Addendum)
Try working on quitting smoking. Using the patch is fine. Contact your primary care provider about this for a prescription.  We want you to be totally smoking free for at least 1 month prior to surgery.   We will request clearance from your pulmonary doctor and your cardiology doctor to be sure they think you are ok for surgery.    We will have you follow up here in 6 weeks for a reassessment.    Managing the Challenge of Quitting Smoking Quitting smoking is a physical and mental challenge. You may have cravings, withdrawal symptoms, and temptation to smoke. Before quitting, work with your health care provider to make a plan that can help you manage quitting. Making a plan before you quit may keep you from smoking when you have the urge to smoke while trying to quit. How to manage lifestyle changes Managing stress Stress can make you want to smoke, and wanting to smoke may cause stress. It is important to find ways to manage your stress. You could try some of the following: Practice relaxation techniques. Breathe slowly and deeply, in through your nose and out through your mouth. Listen to music. Soak in a bath or take a shower. Imagine a peaceful place or vacation. Get some support. Talk with family or friends about your stress. Join a support group. Talk with a counselor or therapist. Get some physical activity. Go for a walk, run, or bike ride. Play a favorite sport. Practice yoga.  Medicines Talk with your health care provider about medicines that might help you deal with cravings and make quitting easier for you. Relationships Social situations can be difficult when you are quitting smoking. To manage this, you can: Avoid parties and other social situations where people might be smoking. Avoid alcohol. Leave right away if you have the urge to smoke. Explain to your family and friends that you are quitting smoking. Ask for support and let them know you might be a bit  grumpy. Plan activities where smoking is not an option. General instructions Be aware that many people gain weight after they quit smoking. However, not everyone does. To keep from gaining weight, have a plan in place before you quit, and stick to the plan after you quit. Your plan should include: Eating healthy snacks. When you have a craving, it may help to: Eat popcorn, or try carrots, celery, or other cut vegetables. Chew sugar-free gum. Changing how you eat. Eat small portion sizes at meals. Eat 4-6 small meals throughout the day instead of 1-2 large meals a day. Be mindful when you eat. You should avoid watching television or doing other things that might distract you as you eat. Exercising regularly. Make time to exercise each day. If you do not have time for a long workout, do short bouts of exercise for 5-10 minutes several times a day. Do some form of strengthening exercise, such as weight lifting. Do some exercise that gets your heart beating and causes you to breathe deeply, such as walking fast, running, swimming, or biking. This is very important. Drinking plenty of water or other low-calorie or no-calorie drinks. Drink enough fluid to keep your urine pale yellow.  How to recognize withdrawal symptoms Your body and mind may experience discomfort as you try to get used to not having nicotine in your system. These effects are called withdrawal symptoms. They may include: Feeling hungrier than normal. Having trouble concentrating. Feeling irritable or restless. Having trouble sleeping. Feeling depressed. Craving a cigarette.  These symptoms may surprise you, but they are normal to have when quitting smoking. To manage withdrawal symptoms: Avoid places, people, and activities that trigger your cravings. Remember why you want to quit. Get plenty of sleep. Avoid coffee and other drinks that contain caffeine. These may worsen some of your symptoms. How to manage cravings Come up  with a plan for how to deal with your cravings. The plan should include the following: A definition of the specific situation you want to deal with. An activity or action you will take to replace smoking. A clear idea for how this action will help. The name of someone who could help you with this. Cravings usually last for 5-10 minutes. Consider taking the following actions to help you with your plan to deal with cravings: Keep your mouth busy. Chew sugar-free gum. Suck on hard candies or a straw. Brush your teeth. Keep your hands and body busy. Change to a different activity right away. Squeeze or play with a ball. Do an activity or a hobby, such as making bead jewelry, practicing needlepoint, or working with wood. Mix up your normal routine. Take a short exercise break. Go for a quick walk, or run up and down stairs. Focus on doing something kind or helpful for someone else. Call a friend or family member to talk during a craving. Join a support group. Contact a quitline. Where to find support To get help or find a support group: Call the National Cancer Institute's Smoking Quitline: 1-800-QUIT-NOW 336-320-1965) Text QUIT to SmokefreeTXT: 433295 Where to find more information Visit these websites to find more information on quitting smoking: U.S. Department of Health and Human Services: www.smokefree.gov American Lung Association: www.freedomfromsmoking.org Centers for Disease Control and Prevention (CDC): FootballExhibition.com.br American Heart Association: www.heart.org Contact a health care provider if: You want to change your plan for quitting. The medicines you are taking are not helping. Your eating feels out of control or you cannot sleep. You feel depressed or become very anxious. Summary Quitting smoking is a physical and mental challenge. You will face cravings, withdrawal symptoms, and temptation to smoke again. Preparation can help you as you go through these challenges. Try  different techniques to manage stress, handle social situations, and prevent weight gain. You can deal with cravings by keeping your mouth busy (such as by chewing gum), keeping your hands and body busy, calling family or friends, or contacting a quitline for people who want to quit smoking. You can deal with withdrawal symptoms by avoiding places where people smoke, getting plenty of rest, and avoiding drinks that contain caffeine. This information is not intended to replace advice given to you by your health care provider. Make sure you discuss any questions you have with your health care provider. Document Revised: 11/07/2021 Document Reviewed: 11/07/2021 Elsevier Patient Education  2024 [employer].

## 2023-05-21 ENCOUNTER — Telehealth: Payer: Self-pay

## 2023-05-21 NOTE — Telephone Encounter (Signed)
Received a surgical clearance form from [facility]. Patient is being scheduled for a open repair of large umbilical hernia, under general anesthesia.  Patient is scheduled to see Dr. Aundria Rud on 05/24/2023. I have placed the form in his folder.

## 2023-05-21 NOTE — Progress Notes (Signed)
Request for Cardiology Clearance and to hold Eliquis has been faxed to Dr Juliann Pares at [facility]. Request for Pulmonary Clearance has been faxed to Dr Aundria Rud at [facility].

## 2023-05-24 ENCOUNTER — Telehealth: Payer: Self-pay

## 2023-05-24 ENCOUNTER — Ambulatory Visit (INDEPENDENT_AMBULATORY_CARE_PROVIDER_SITE_OTHER): Payer: PPO | Admitting: Student in an Organized Health Care Education/Training Program

## 2023-05-24 ENCOUNTER — Encounter: Payer: Self-pay | Admitting: Student in an Organized Health Care Education/Training Program

## 2023-05-24 VITALS — BP 132/70 | HR 73 | Temp 98.4°F | Ht 63.0 in | Wt 194.8 lb

## 2023-05-24 DIAGNOSIS — F17210 Nicotine dependence, cigarettes, uncomplicated: Secondary | ICD-10-CM

## 2023-05-24 DIAGNOSIS — R918 Other nonspecific abnormal finding of lung field: Secondary | ICD-10-CM

## 2023-05-24 DIAGNOSIS — Z01811 Encounter for preprocedural respiratory examination: Secondary | ICD-10-CM | POA: Diagnosis not present

## 2023-05-24 MED ORDER — NICOTINE POLACRILEX 2 MG MT LOZG
2.0000 mg | LOZENGE | OROMUCOSAL | 3 refills | Status: AC | PRN
Start: 2023-05-24 — End: 2023-08-22

## 2023-05-24 NOTE — Telephone Encounter (Signed)
OV note has been faxed to [facility].  Received successful fax confirmation.  Nothing further needed.

## 2023-05-24 NOTE — Progress Notes (Signed)
Assessment & Plan:   #Pulmonary infiltrate  Patient presented for the evaluation of a lingular infiltrate with mass like consolidation, tree-in-bud nodularity, as well as bronchial wall thickening. Bronchoscopy with BAL grew H. Influenza, and the patient received a course of Cefpodoxime for treatment. I will obtain a repeat chest CT to re-evaluate said nodularity on her CT scan. I will also obtain a pulmonary function test to assess for COPD given history of smoking, shortness of breath, and tiotropium use.  - Pulmonary Function Test [facility] Only; Future - CT CHEST WO CONTRAST; Future  #Moderate smoker (20 or less per day)  History of smoking, attempting to quit with the patch. Will supplement with nicotine lozenges.  - nicotine polacrilex (NICOTINE MINI) 2 MG lozenge; Take 1 lozenge (2 mg total) by mouth every 2 (two) hours as needed for smoking cessation.  Dispense: 72 lozenge; Refill: 3  #Preoperative pulmonary risk assessment  History of smoking and presumed COPD, but patient is currently at her baseline. She is oxygenating well on room air, has clear lungs, and has had no change in symptoms. She is currently optimized for her surgery and is overall low risk for post operative pulmonary complications. PFT ordered and pending. She is attempting to quit smoking and I encouraged full smoking cessation.   -pulmonary function test -ok to proceed with surgery   Return in about 3 months (around 08/24/2023).  I spent 30 minutes caring for this patient today, including preparing to see the patient, obtaining a medical history , reviewing a separately obtained history, performing a medically appropriate examination and/or evaluation, counseling and educating the patient/family/caregiver, ordering medications, tests, or procedures, documenting clinical information in the electronic health record, and independently interpreting results (not separately reported/billed) and communicating results to  the patient/family/caregiver  Raechel Chute, MD [facility] 05/24/2023 10:30 AM    End of visit medications:  Meds ordered this encounter  Medications   nicotine polacrilex (NICOTINE MINI) 2 MG lozenge    Sig: Take 1 lozenge (2 mg total) by mouth every 2 (two) hours as needed for smoking cessation.    Dispense:  72 lozenge    Refill:  3     Current Outpatient Medications:    acetaminophen (TYLENOL) 500 MG tablet, Take 1,000 mg by mouth in the morning and at bedtime., Disp: , Rfl:    albuterol (PROAIR HFA) 108 (90 Base) MCG/ACT inhaler, Inhale 2 puffs into the lungs every 6 (six) hours as needed for wheezing or shortness of breath., Disp: 18 g, Rfl: 4   apixaban (ELIQUIS) 5 MG TABS tablet, Take 1 tablet (5 mg total) by mouth 2 (two) times daily., Disp: 60 tablet, Rfl: 1   ascorbic acid (VITAMIN C) 500 MG tablet, Take 500 mg by mouth daily., Disp: , Rfl:    atorvastatin (LIPITOR) 80 MG tablet, Take 1 tablet (80 mg total) by mouth daily. (Patient taking differently: Take 80 mg by mouth at bedtime.), Disp: 90 tablet, Rfl: 4   baclofen (LIORESAL) 10 MG tablet, Take 0.5 tablets (5 mg total) by mouth 3 (three) times daily., Disp: 135 each, Rfl: 4   busPIRone (BUSPAR) 10 MG tablet, TAKE 2 TABLETS BY MOUTH 3 TIMES DAILY., Disp: 540 tablet, Rfl: 1   Calcium Citrate-Vitamin D (CALCIUM + D PO), Take 1,000 mg by mouth daily., Disp: , Rfl:    Cholecalciferol (VITAMIN D) 50 MCG (2000 UT) CAPS, Take by mouth., Disp: , Rfl:    clonazePAM (KLONOPIN) 0.5 MG tablet, Take  1 tablet (0.5 mg total) by mouth daily as needed for anxiety., Disp: 30 tablet, Rfl: 2   collagenase (SANTYL) 250 UNIT/GM ointment, Apply 1 Application topically daily. (Patient taking differently: Apply 1 Application topically daily. Applies to left ankle), Disp: 15 g, Rfl: 4   furosemide (LASIX) 20 MG tablet, Take 1 tablet (20 mg total) by mouth daily. (Patient taking differently: Take 20 mg by mouth every morning.),  Disp: 90 tablet, Rfl: 4   gabapentin (NEURONTIN) 800 MG tablet, Take 1 tablet (800 mg total) by mouth in the morning and at bedtime., Disp: 180 tablet, Rfl: 4   hydrOXYzine (ATARAX) 10 MG tablet, Take 1 tablet (10 mg total) by mouth 3 (three) times daily as needed. (Patient taking differently: Take 10 mg by mouth 3 (three) times daily.), Disp: 270 tablet, Rfl: 4   Melatonin 10 MG TABS, Take 1 tablet by mouth at bedtime., Disp: , Rfl:    Multiple Vitamins-Minerals (MULTIVITAMIN WITH MINERALS) tablet, Take 1 tablet by mouth daily., Disp: , Rfl:    nicotine polacrilex (NICOTINE MINI) 2 MG lozenge, Take 1 lozenge (2 mg total) by mouth every 2 (two) hours as needed for smoking cessation., Disp: 72 lozenge, Rfl: 3   omeprazole (PRILOSEC) 40 MG capsule, Take 1 capsule (40 mg total) by mouth daily. (Patient taking differently: Take 40 mg by mouth every morning.), Disp: 90 capsule, Rfl: 4   potassium chloride SA (KLOR-CON M) 20 MEQ tablet, Take 1 tablet (20 mEq total) by mouth daily. (Patient taking differently: 20 mEq every morning.), Disp: 90 tablet, Rfl: 4   sotalol (BETAPACE) 80 MG tablet, Take 80 mg by mouth 2 (two) times daily., Disp: , Rfl:    Tiotropium Bromide Monohydrate (SPIRIVA RESPIMAT) 2.5 MCG/ACT AERS, INHALE 2 SPRAY(S) BY MOUTH ONCE DAILY (Patient taking differently: 2 each at bedtime. INHALE 2 SPRAY(S) BY MOUTH ONCE DAILY), Disp: 4 g, Rfl: 6   venlafaxine XR (EFFEXOR-XR) 150 MG 24 hr capsule, Take 1 capsule (150 mg total) by mouth daily with breakfast., Disp: 90 capsule, Rfl: 4   vitamin B-12 (CYANOCOBALAMIN) 1000 MCG tablet, Take 1,000 mcg by mouth daily., Disp: , Rfl:    Subjective:   PATIENT ID: [name] GENDER: female DOB: Jan 11, 1956, MRN: [id number]  Chief Complaint  Patient presents with   Follow-up    Occ prod cough with clear sputum mainly in the morning.     HPI  67 year old female with a history of afib (on Eliquis) and presumed COPD (on Spiriva Respimat) who presents  to clinic for follow up. I had first saw her on 04/23/2023 for the evaluation of findings noted on LDCT for lung cancer screening. She underwent bronchoscopy on 04/09/2023 with BAL.  Patient remains in her usual state of health. She has no complaints today. She has an occasional cough (mostly in the morning) that brings up clear sputum. No chest tightness or chest pain reported. No wheezing, hemoptysis, or increased shortness of breath. She does have some exertional dyspnea. Denies pleurisy.   Patient was enrolled in lung cancer screening and was note to have a left lingular infiltrate with tree in bud nodularity that have increased over the past few scans. The last two CT's from November of 2023 and February of 2024 showed a nodular and mass-like areas of consolidation in the lingula. On my review, there are tree-in-bud opacities in that area as well. She underwent flexible bronchoscopy on 04/09/2023 with BAL growing H. Influenza. I prescribed her a course of  Cefpodoxime which she tolerated well.  Patient has been seen by surgery and is in the process of being evaluated for hernia repair. We are asked to provide pre-operative risk assessment prior to surgery.   Patient is a current smoker and has smoked for around 50 years, with 50 pack years of smoking history. She used to work in a [employer]. She lives at home with her son, and has lived in her house for over 20 years. She has a crawl space, no basement, no mold, no hot tub, and no Jacuzzi. Water source is municipal. No report of previous infections.  Ancillary information including prior medications, full medical/surgical/family/social histories, and PFTs (when available) are listed below and have been reviewed.   Review of Systems  Constitutional:  Negative for chills, fever, malaise/fatigue and weight loss.  Respiratory:  Positive for cough, sputum production and shortness of breath. Negative for hemoptysis and wheezing.   Cardiovascular:   Negative for chest pain and palpitations.  Psychiatric/Behavioral:  Positive for depression.      Objective:   Vitals:   05/24/23 1016  BP: 132/70  Pulse: 73  Temp: 98.4 F (36.9 C)  TempSrc: Temporal  SpO2: 96%  Weight: 194 lb 12.8 oz (88.4 kg)  Height: 5\' 3"  (1.6 m)   96% on RA  BMI Readings from Last 3 Encounters:  05/24/23 34.51 kg/m  05/20/23 33.48 kg/m  05/17/23 32.22 kg/m   Wt Readings from Last 3 Encounters:  05/24/23 194 lb 12.8 oz (88.4 kg)  05/20/23 189 lb (85.7 kg)  05/17/23 187 lb 12.8 oz (85.2 kg)    Physical Exam Constitutional:      General: She is not in acute distress.    Appearance: She is not ill-appearing.  HENT:     Head: Normocephalic.     Mouth/Throat:     Mouth: Mucous membranes are moist.  Cardiovascular:     Rate and Rhythm: Normal rate and regular rhythm.     Pulses: Normal pulses.     Heart sounds: Normal heart sounds.  Pulmonary:     Effort: Pulmonary effort is normal.     Breath sounds: No wheezing, rhonchi or rales.  Abdominal:     General: There is distension.     Palpations: Abdomen is soft.     Hernia: A hernia is present.  Neurological:     General: No focal deficit present.     Mental Status: She is alert and oriented to person, place, and time. Mental status is at baseline.       Ancillary Information    Past Medical History:  Diagnosis Date   Anxiety    Aortic atherosclerosis (HCC)    Arthritis    Atrial fibrillation with RVR (HCC)    a.) CHA2DS2VASc = 4 (age, sex, HTN, vascular disease history);  b.) rate/rhythm maintained on oral sotolol; chronically anticoagulated with apixaban   Atypical chest pain    Benign essential tremor    Chronic kidney disease, stage 3a (HCC)    COPD (chronic obstructive pulmonary disease) (HCC)    DDD (degenerative disc disease), cervical    a.) s/p ACDF C6-C7   DDD (degenerative disc disease), lumbar    Enlarged heart    Fatty liver    GERD (gastroesophageal reflux  disease)    Hiatal hernia    Hyperlipidemia    Hypertension    Insomnia    a.) takes melatonin PRN   Long term current use of anticoagulant    a.) apixaban  Mass of right adrenal gland (HCC)    a.) CT AP 08/12/2022: measured 3.3 cm   Monocytosis    Morbid obesity (HCC)    Nicotine dependence    OSA on CPAP    Osteoporosis    Pancreatitis    Peripheral vascular disease (HCC)    Psoriasis    SOB (shortness of breath)    Umbilical hernia    Vitamin D deficiency      Family History  Problem Relation Age of Onset   COPD Father    Heart disease Father    Hyperlipidemia Father    Hypertension Father    Dementia Father    Cancer Sister    Hyperlipidemia Sister    Hypertension Sister    Lymphoma Sister    Migraines Son    Stroke Maternal Grandfather    Heart attack Brother    Cancer Brother        colorectal and liver   Breast cancer Paternal Aunt    Breast cancer Paternal Aunt      Past Surgical History:  Procedure Laterality Date   ABDOMINAL HYSTERECTOMY     ACHILLES TENDON REPAIR     Removed bone spur and repaired achilles tendon   ANTERIOR CERVICAL DECOMP/DISCECTOMY FUSION     APPENDECTOMY     COLONOSCOPY WITH PROPOFOL N/A 01/17/2019   Procedure: COLONOSCOPY WITH PROPOFOL;  Surgeon: Wyline Mood, MD;  Location: [facility] ENDOSCOPY;  Service: Gastroenterology;  Laterality: N/A;   FLEXIBLE BRONCHOSCOPY Bilateral 04/09/2023   Procedure: FLEXIBLE BRONCHOSCOPY;  Surgeon: Raechel Chute, MD;  Location: [facility] ORS;  Service: Pulmonary;  Laterality: Bilateral;   TONSILLECTOMY      Social History   Socioeconomic History   Marital status: Widowed    Spouse name: Not on file   Number of children: Not on file   Years of education: Not on file   Highest education level: GED or equivalent  Occupational History   Occupation: disabled  Tobacco Use   Smoking status: Every Day    Packs/day: 1.00    Years: 48.00    Additional pack years: 0.00    Total pack years: 48.00     Types: Cigarettes    Passive exposure: Past   Smokeless tobacco: Never  Vaping Use   Vaping Use: Never used  Substance and Sexual Activity   Alcohol use: No    Alcohol/week: 0.0 standard drinks of alcohol   Drug use: No   Sexual activity: Never    Partners: Male  Other Topics Concern   Not on file  Social History Narrative   Not on file   Social Determinants of Health   Financial Resource Strain: Low Risk  (04/10/2023)   Overall Financial Resource Strain (CARDIA)    Difficulty of Paying Living Expenses: Not very hard  Food Insecurity: No Food Insecurity (04/10/2023)   Hunger Vital Sign    Worried About Running Out of Food in the Last Year: Never true    Ran Out of Food in the Last Year: Never true  Transportation Needs: No Transportation Needs (04/10/2023)   PRAPARE - Administrator, Civil Service (Medical): No    Lack of Transportation (Non-Medical): No  Physical Activity: Inactive (04/10/2023)   Exercise Vital Sign    Days of Exercise per Week: 0 days    Minutes of Exercise per Session: 0 min  Stress: Stress Concern Present (04/10/2023)   [employer] of Occupational Health - Occupational Stress Questionnaire    Feeling of Stress :  Rather much  Social Connections: Socially Isolated (04/10/2023)   Social Connection and Isolation Panel [NHANES]    Frequency of Communication with Friends and Family: More than three times a week    Frequency of Social Gatherings with Friends and Family: More than three times a week    Attends Religious Services: Never    Database administrator or Organizations: No    Attends Banker Meetings: Never    Marital Status: Widowed  Intimate Partner Violence: Not At Risk (01/12/2023)   Humiliation, Afraid, Rape, and Kick questionnaire    Fear of Current or Ex-Partner: No    Emotionally Abused: No    Physically Abused: No    Sexually Abused: No     Allergies  Allergen Reactions   Iodinated Contrast Media Rash    Had  rash present to neck with PET scan contrast     CBC    Component Value Date/Time   WBC 10.1 03/31/2023 0321   RBC 3.99 03/31/2023 0321   HGB 11.7 (L) 03/31/2023 0321   HGB 11.0 (L) 03/17/2023 1529   HCT 37.3 03/31/2023 0321   HCT 35.2 03/17/2023 1529   PLT 269 03/31/2023 0321   PLT 236 03/17/2023 1529   MCV 93.5 03/31/2023 0321   MCV 91 03/17/2023 1529   MCV 93 03/23/2014 1851   MCH 29.3 03/31/2023 0321   MCHC 31.4 03/31/2023 0321   RDW 15.7 (H) 03/31/2023 0321   RDW 14.0 03/17/2023 1529   RDW 14.7 (H) 03/23/2014 1851   LYMPHSABS 3.5 03/31/2023 0321   LYMPHSABS 3.7 (H) 03/17/2023 1529   MONOABS 1.0 03/31/2023 0321   EOSABS 0.2 03/31/2023 0321   EOSABS 0.2 03/17/2023 1529   BASOSABS 0.1 03/31/2023 0321   BASOSABS 0.1 03/17/2023 1529    Pulmonary Functions Testing Results:     No data to display          Outpatient Medications Prior to Visit  Medication Sig Dispense Refill   acetaminophen (TYLENOL) 500 MG tablet Take 1,000 mg by mouth in the morning and at bedtime.     albuterol (PROAIR HFA) 108 (90 Base) MCG/ACT inhaler Inhale 2 puffs into the lungs every 6 (six) hours as needed for wheezing or shortness of breath. 18 g 4   apixaban (ELIQUIS) 5 MG TABS tablet Take 1 tablet (5 mg total) by mouth 2 (two) times daily. 60 tablet 1   ascorbic acid (VITAMIN C) 500 MG tablet Take 500 mg by mouth daily.     atorvastatin (LIPITOR) 80 MG tablet Take 1 tablet (80 mg total) by mouth daily. (Patient taking differently: Take 80 mg by mouth at bedtime.) 90 tablet 4   baclofen (LIORESAL) 10 MG tablet Take 0.5 tablets (5 mg total) by mouth 3 (three) times daily. 135 each 4   busPIRone (BUSPAR) 10 MG tablet TAKE 2 TABLETS BY MOUTH 3 TIMES DAILY. 540 tablet 1   Calcium Citrate-Vitamin D (CALCIUM + D PO) Take 1,000 mg by mouth daily.     Cholecalciferol (VITAMIN D) 50 MCG (2000 UT) CAPS Take by mouth.     clonazePAM (KLONOPIN) 0.5 MG tablet Take 1 tablet (0.5 mg total) by mouth daily as  needed for anxiety. 30 tablet 2   collagenase (SANTYL) 250 UNIT/GM ointment Apply 1 Application topically daily. (Patient taking differently: Apply 1 Application topically daily. Applies to left ankle) 15 g 4   furosemide (LASIX) 20 MG tablet Take 1 tablet (20 mg total) by mouth daily. (Patient taking  differently: Take 20 mg by mouth every morning.) 90 tablet 4   gabapentin (NEURONTIN) 800 MG tablet Take 1 tablet (800 mg total) by mouth in the morning and at bedtime. 180 tablet 4   hydrOXYzine (ATARAX) 10 MG tablet Take 1 tablet (10 mg total) by mouth 3 (three) times daily as needed. (Patient taking differently: Take 10 mg by mouth 3 (three) times daily.) 270 tablet 4   Melatonin 10 MG TABS Take 1 tablet by mouth at bedtime.     Multiple Vitamins-Minerals (MULTIVITAMIN WITH MINERALS) tablet Take 1 tablet by mouth daily.     omeprazole (PRILOSEC) 40 MG capsule Take 1 capsule (40 mg total) by mouth daily. (Patient taking differently: Take 40 mg by mouth every morning.) 90 capsule 4   potassium chloride SA (KLOR-CON M) 20 MEQ tablet Take 1 tablet (20 mEq total) by mouth daily. (Patient taking differently: 20 mEq every morning.) 90 tablet 4   sotalol (BETAPACE) 80 MG tablet Take 80 mg by mouth 2 (two) times daily.     Tiotropium Bromide Monohydrate (SPIRIVA RESPIMAT) 2.5 MCG/ACT AERS INHALE 2 SPRAY(S) BY MOUTH ONCE DAILY (Patient taking differently: 2 each at bedtime. INHALE 2 SPRAY(S) BY MOUTH ONCE DAILY) 4 g 6   venlafaxine XR (EFFEXOR-XR) 150 MG 24 hr capsule Take 1 capsule (150 mg total) by mouth daily with breakfast. 90 capsule 4   vitamin B-12 (CYANOCOBALAMIN) 1000 MCG tablet Take 1,000 mcg by mouth daily.     No facility-administered medications prior to visit.

## 2023-05-24 NOTE — Telephone Encounter (Signed)
Received pulmonary clearance from Dr. Raechel Chute. Pt is currently optimized for her surgery and is overall low risk for post operative pulmonary complications.   See notes in Epic.

## 2023-05-28 LAB — ACID FAST CULTURE WITH REFLEXED SENSITIVITIES (MYCOBACTERIA): Acid Fast Culture: NEGATIVE

## 2023-05-31 LAB — ACID FAST CULTURE WITH REFLEXED SENSITIVITIES (MYCOBACTERIA): Acid Fast Culture: NEGATIVE

## 2023-06-07 ENCOUNTER — Ambulatory Visit
Admission: RE | Admit: 2023-06-07 | Discharge: 2023-06-07 | Disposition: A | Discharge status: Home / Self Care | Payer: PPO | Source: Ambulatory Visit | Attending: Student in an Organized Health Care Education/Training Program | Admitting: Student in an Organized Health Care Education/Training Program

## 2023-06-07 DIAGNOSIS — R918 Other nonspecific abnormal finding of lung field: Secondary | ICD-10-CM | POA: Insufficient documentation

## 2023-06-07 DIAGNOSIS — F17210 Nicotine dependence, cigarettes, uncomplicated: Secondary | ICD-10-CM | POA: Diagnosis not present

## 2023-06-07 DIAGNOSIS — I700 Atherosclerosis of aorta: Secondary | ICD-10-CM | POA: Insufficient documentation

## 2023-06-07 DIAGNOSIS — J432 Centrilobular emphysema: Secondary | ICD-10-CM | POA: Diagnosis not present

## 2023-06-07 DIAGNOSIS — J479 Bronchiectasis, uncomplicated: Secondary | ICD-10-CM | POA: Diagnosis not present

## 2023-06-14 ENCOUNTER — Other Ambulatory Visit: Payer: Self-pay | Admitting: *Deleted

## 2023-06-14 DIAGNOSIS — F17210 Nicotine dependence, cigarettes, uncomplicated: Secondary | ICD-10-CM

## 2023-06-14 DIAGNOSIS — Z87891 Personal history of nicotine dependence: Secondary | ICD-10-CM

## 2023-06-14 DIAGNOSIS — Z122 Encounter for screening for malignant neoplasm of respiratory organs: Secondary | ICD-10-CM

## 2023-07-01 ENCOUNTER — Encounter: Payer: Self-pay | Admitting: Surgery

## 2023-07-05 DIAGNOSIS — G8929 Other chronic pain: Secondary | ICD-10-CM | POA: Diagnosis not present

## 2023-07-05 DIAGNOSIS — F411 Generalized anxiety disorder: Secondary | ICD-10-CM | POA: Diagnosis not present

## 2023-07-05 DIAGNOSIS — I4891 Unspecified atrial fibrillation: Secondary | ICD-10-CM | POA: Diagnosis not present

## 2023-07-05 DIAGNOSIS — G4730 Sleep apnea, unspecified: Secondary | ICD-10-CM | POA: Diagnosis not present

## 2023-07-05 DIAGNOSIS — K219 Gastro-esophageal reflux disease without esophagitis: Secondary | ICD-10-CM | POA: Diagnosis not present

## 2023-07-05 DIAGNOSIS — E782 Mixed hyperlipidemia: Secondary | ICD-10-CM | POA: Diagnosis not present

## 2023-07-05 DIAGNOSIS — Z720 Tobacco use: Secondary | ICD-10-CM | POA: Diagnosis not present

## 2023-07-05 DIAGNOSIS — J441 Chronic obstructive pulmonary disease with (acute) exacerbation: Secondary | ICD-10-CM | POA: Diagnosis not present

## 2023-07-05 DIAGNOSIS — I10 Essential (primary) hypertension: Secondary | ICD-10-CM | POA: Diagnosis not present

## 2023-07-05 NOTE — Progress Notes (Signed)
Cardiology clearance has been received from Dr Juliann Pares. The patient is cleared at Medium risk for surgery. She may hold her Eliquis 3 days prior to surgery.

## 2023-07-05 NOTE — Progress Notes (Signed)
Patient ID: [name], female   DOB: 1956/11/01, 67 y.o.   MRN: [id number]  Chief Complaint: Ventral hernia, f/u re smoking cessation.   History of Present Illness  This patient returns today readily admitting she has not attempted to quit smoking.  She reports she is still highly motivated to have her hernia fixed, however she continues to be at excessive risk for elective repair at this time. I am also somewhat concerned about loss of domain.  So in spite of successful smoking cessation, additional measures will need to be performed for possible respiratory rehab prior to attempting repair.  We discussed open component separation with mesh reinforcement, I have not been in a habit of performing this procedure and should she improve her candidacy, we will further discuss referral for moving on along that path.  [name] is a 67 y.o. female with history of an umbilical hernia over 5 years.  Previously seen and evaluated by surgeon, reported to need weight loss prior to consideration of repair.  She reports she is lost 100 pounds since then.  She still has significant bulging, and is tender and painful at times.  She denies any history of nausea or vomiting or other intestinal obstruction.  She takes Eliquis for atrial fibrillation.  She takes a maintenance Spiriva inhaler for her COPD, not requiring rescue doses.  She uses CPAP at night, and walks with a walker for some gait stability.  She seems to be highly motivated to have her hernia repaired becoming less and less tolerant of his discomfort.  Past Medical History Past Medical History:  Diagnosis Date   Anxiety    Aortic atherosclerosis (HCC)    Arthritis    Atrial fibrillation with RVR (HCC)    a.) CHA2DS2VASc = 4 (age, sex, HTN, vascular disease history);  b.) rate/rhythm maintained on oral sotolol; chronically anticoagulated with apixaban   Atypical chest pain    Benign essential tremor    Chronic kidney disease, stage 3a (HCC)     COPD (chronic obstructive pulmonary disease) (HCC)    DDD (degenerative disc disease), cervical    a.) s/p ACDF C6-C7   DDD (degenerative disc disease), lumbar    Enlarged heart    Fatty liver    GERD (gastroesophageal reflux disease)    Hiatal hernia    Hyperlipidemia    Hypertension    Insomnia    a.) takes melatonin PRN   Long term current use of anticoagulant    a.) apixaban   Mass of right adrenal gland (HCC)    a.) CT AP 08/12/2022: measured 3.3 cm   Monocytosis    Morbid obesity (HCC)    Nicotine dependence    OSA on CPAP    Osteoporosis    Pancreatitis    Peripheral vascular disease (HCC)    Psoriasis    SOB (shortness of breath)    Umbilical hernia    Vitamin D deficiency       Past Surgical History:  Procedure Laterality Date   ABDOMINAL HYSTERECTOMY     ACHILLES TENDON REPAIR     Removed bone spur and repaired achilles tendon   ANTERIOR CERVICAL DECOMP/DISCECTOMY FUSION     APPENDECTOMY     COLONOSCOPY WITH PROPOFOL N/A 01/17/2019   Procedure: COLONOSCOPY WITH PROPOFOL;  Surgeon: Wyline Mood, MD;  Location: [facility] ENDOSCOPY;  Service: Gastroenterology;  Laterality: N/A;   FLEXIBLE BRONCHOSCOPY Bilateral 04/09/2023   Procedure: FLEXIBLE BRONCHOSCOPY;  Surgeon: Raechel Chute, MD;  Location: [facility] ORS;  Service: Pulmonary;  Laterality: Bilateral;   TONSILLECTOMY      Allergies  Allergen Reactions   Iodinated Contrast Media Rash    Had rash present to neck with PET scan contrast    Current Outpatient Medications  Medication Sig Dispense Refill   acetaminophen (TYLENOL) 500 MG tablet Take 1,000 mg by mouth in the morning and at bedtime.     albuterol (PROAIR HFA) 108 (90 Base) MCG/ACT inhaler Inhale 2 puffs into the lungs every 6 (six) hours as needed for wheezing or shortness of breath. 18 g 4   apixaban (ELIQUIS) 5 MG TABS tablet Take 1 tablet (5 mg total) by mouth 2 (two) times daily. 60 tablet 1   ascorbic acid (VITAMIN C) 500 MG tablet Take 500 mg by  mouth daily.     atorvastatin (LIPITOR) 80 MG tablet Take 1 tablet (80 mg total) by mouth daily. (Patient taking differently: Take 80 mg by mouth at bedtime.) 90 tablet 4   baclofen (LIORESAL) 10 MG tablet Take 0.5 tablets (5 mg total) by mouth 3 (three) times daily. 135 each 4   busPIRone (BUSPAR) 10 MG tablet TAKE 2 TABLETS BY MOUTH 3 TIMES DAILY. 540 tablet 1   Calcium Citrate-Vitamin D (CALCIUM + D PO) Take 1,000 mg by mouth daily.     Cholecalciferol (VITAMIN D) 50 MCG (2000 UT) CAPS Take by mouth.     clonazePAM (KLONOPIN) 0.5 MG tablet Take 1 tablet (0.5 mg total) by mouth daily as needed for anxiety. 30 tablet 2   collagenase (SANTYL) 250 UNIT/GM ointment Apply 1 Application topically daily. (Patient taking differently: Apply 1 Application topically daily. Applies to left ankle) 15 g 4   furosemide (LASIX) 20 MG tablet Take 1 tablet (20 mg total) by mouth daily. (Patient taking differently: Take 20 mg by mouth every morning.) 90 tablet 4   gabapentin (NEURONTIN) 800 MG tablet Take 1 tablet (800 mg total) by mouth in the morning and at bedtime. 180 tablet 4   hydrOXYzine (ATARAX) 10 MG tablet Take 1 tablet (10 mg total) by mouth 3 (three) times daily as needed. (Patient taking differently: Take 10 mg by mouth 3 (three) times daily.) 270 tablet 4   Melatonin 10 MG TABS Take 1 tablet by mouth at bedtime.     Multiple Vitamins-Minerals (MULTIVITAMIN WITH MINERALS) tablet Take 1 tablet by mouth daily.     nicotine polacrilex (NICOTINE MINI) 2 MG lozenge Take 1 lozenge (2 mg total) by mouth every 2 (two) hours as needed for smoking cessation. 72 lozenge 3   omeprazole (PRILOSEC) 40 MG capsule Take 1 capsule (40 mg total) by mouth daily. (Patient taking differently: Take 40 mg by mouth every morning.) 90 capsule 4   potassium chloride SA (KLOR-CON M) 20 MEQ tablet Take 1 tablet (20 mEq total) by mouth daily. (Patient taking differently: 20 mEq every morning.) 90 tablet 4   sotalol (BETAPACE) 80 MG  tablet Take 80 mg by mouth 2 (two) times daily.     Tiotropium Bromide Monohydrate (SPIRIVA RESPIMAT) 2.5 MCG/ACT AERS INHALE 2 SPRAY(S) BY MOUTH ONCE DAILY (Patient taking differently: 2 each at bedtime. INHALE 2 SPRAY(S) BY MOUTH ONCE DAILY) 4 g 6   venlafaxine XR (EFFEXOR-XR) 150 MG 24 hr capsule Take 1 capsule (150 mg total) by mouth daily with breakfast. 90 capsule 4   vitamin B-12 (CYANOCOBALAMIN) 1000 MCG tablet Take 1,000 mcg by mouth daily.     No current facility-administered medications for this visit.  Family History Family History  Problem Relation Age of Onset   COPD Father    Heart disease Father    Hyperlipidemia Father    Hypertension Father    Dementia Father    Cancer Sister    Hyperlipidemia Sister    Hypertension Sister    Lymphoma Sister    Migraines Son    Stroke Maternal Grandfather    Heart attack Brother    Cancer Brother        colorectal and liver   Breast cancer Paternal Aunt    Breast cancer Paternal Aunt       Social History Social History   Tobacco Use   Smoking status: Every Day    Current packs/day: 1.00    Average packs/day: 1 pack/day for 48.0 years (48.0 ttl pk-yrs)    Types: Cigarettes    Passive exposure: Past   Smokeless tobacco: Never  Vaping Use   Vaping status: Never Used  Substance Use Topics   Alcohol use: No    Alcohol/week: 0.0 standard drinks of alcohol   Drug use: No        Review of Systems  Constitutional: Negative.   HENT: Negative.    Eyes: Negative.   Respiratory:  Positive for shortness of breath.   Cardiovascular: Negative.   Gastrointestinal:  Positive for abdominal pain. Negative for constipation, diarrhea, nausea and vomiting.  Genitourinary: Negative.   Skin: Negative.   Neurological: Negative.   Psychiatric/Behavioral:  Positive for depression.      Physical Exam There were no vitals taken for this visit.    CONSTITUTIONAL: Well developed, and nourished, appropriately responsive and  aware without distress.  Smells of smoke, also lives with her son who smokes. EYES: Sclera non-icteric.   EARS, NOSE, MOUTH AND THROAT:  The oropharynx is clear. Oral mucosa is pink and moist.   Hearing is intact to voice.  NECK: Trachea is midline, and there is no jugular venous distension.  LYMPH NODES:  Lymph nodes in the neck are not appreciated. RESPIRATORY:   Normal respiratory effort without pathologic use of accessory muscles. CARDIOVASCULAR:  Well perfused.  GI: The abdomen is notable for a prominent hernia sac, potentially consistent with loss of domain, however I was able to get her reduced without too much effort and without significant constraint of her breathing.  Obviously her defect remains on approximated at approximately 9 cm.  Soft, nontender, and nondistended. There were no palpable masses.  I did not appreciate hepatosplenomegaly.  MUSCULOSKELETAL:  Symmetrical muscle tone appreciated in all four extremities.    SKIN: Skin turgor is normal. No pathologic skin lesions appreciated.  NEUROLOGIC:  Motor and sensation appear grossly normal.  Cranial nerves are grossly without defect. PSYCH:  Alert and oriented to person, place and time. Affect is appropriate for situation.  Data Reviewed I have personally reviewed what is currently available of the patient's imaging, recent labs and medical records.   Labs:     Latest Ref Rng & Units 03/31/2023    3:21 AM 03/17/2023    3:29 PM 12/02/2022    2:58 PM  CBC  WBC 4.0 - 10.5 K/uL 10.1  9.3  12.9   Hemoglobin 12.0 - 15.0 g/dL 33.2  95.1  88.4   Hematocrit 36.0 - 46.0 % 37.3  35.2  41.4   Platelets 150 - 400 K/uL 269  236  321       Latest Ref Rng & Units 03/31/2023    3:21 AM 03/17/2023  3:29 PM 12/02/2022    2:58 PM  CMP  Glucose 70 - 99 mg/dL 629  85  94   BUN 8 - 23 mg/dL 13  11  8    Creatinine 0.44 - 1.00 mg/dL 5.28  4.13  2.44   Sodium 135 - 145 mmol/L 141  143  142   Potassium 3.5 - 5.1 mmol/L 3.8  4.3  4.3   Chloride 98  - 111 mmol/L 109  107  102   CO2 22 - 32 mmol/L 26  25  25    Calcium 8.9 - 10.3 mg/dL 8.8  9.0  9.5   Total Protein 6.5 - 8.1 g/dL 6.9  5.9  6.4   Total Bilirubin 0.3 - 1.2 mg/dL 0.7  0.3  0.2   Alkaline Phos 38 - 126 U/L 236  287  189   AST 15 - 41 U/L 27  26  25    ALT 0 - 44 U/L 12  15  11      Imaging: Radiological images reviewed:  CLINICAL DATA:  Lymphadenopathy   EXAM: CT ABDOMEN AND PELVIS WITHOUT CONTRAST   TECHNIQUE: Multidetector CT imaging of the abdomen and pelvis was performed following the standard protocol without IV contrast.   RADIATION DOSE REDUCTION: This exam was performed according to the departmental dose-optimization program which includes automated exposure control, adjustment of the mA and/or kV according to patient size and/or use of iterative reconstruction technique.   COMPARISON:  PET-CT 04/30/2022, abdominal CT 04/14/2022   FINDINGS: Lower chest: No acute abnormality.   Hepatobiliary: Subtly nodular hepatic surface contour. No focal liver lesion identified on noncontrast CT. Cholelithiasis. No pericholecystic inflammatory changes by CT. No biliary dilatation.   Pancreas: Unremarkable. No pancreatic ductal dilatation or surrounding inflammatory changes.   Spleen: Normal in size without focal abnormality.   Adrenals/Urinary Tract: Stable 3.3 cm right adrenal mass containing calcification. Left adrenal gland is unremarkable. Bilateral kidneys appear normal. No renal stone or hydronephrosis. Urinary bladder within normal limits for the degree of distension.   Stomach/Bowel: Stomach is within normal limits. Appendix is surgically absent. No evidence of bowel wall thickening, distention, or inflammatory changes.   Vascular/Lymphatic: Scattered aortoiliac atherosclerotic calcifications without aneurysm. Multiple mildly enlarged upper abdominal lymph nodes. Reference nodes include 10 mm portacaval node (series 2, image 23), 9 mm gastrohepatic  ligament node (series 2, image 21), 12 mm celiac node (series 2, image 14). Nodes have not appreciably changed in size from the previous study. No intrapelvic lymphadenopathy.   Reproductive: Status post hysterectomy. No adnexal masses.   Other: Large wide necked umbilical hernia containing non compromised loops of small bowel, transverse colon, and mesenteric fat and vessels. No ascites. No pneumoperitoneum.   Musculoskeletal: No acute or significant osseous findings. Severe, end-stage degenerative changes of the right hip. Advanced lower thoracic spondylosis. Degenerative disc disease of L4-5.   IMPRESSION: 1. Stable size and appearance of multiple mildly enlarged upper abdominal lymph nodes. No new or enlarging abdominopelvic lymph nodes are seen. 2. No acute abnormality within the abdomen or pelvis. 3. Large umbilical hernia containing non-compromised loops of large and small bowel. No evidence of bowel obstruction. 4. Cholelithiasis without evidence of acute cholecystitis. 5. Stable 3.3 cm right adrenal mass. No follow-up imaging recommended. 6. Subtly nodular hepatic surface contour suggesting underlying cirrhosis. Correlate with liver function tests. 7. Aortic Atherosclerosis (ICD10-I70.0).     Electronically Signed   By: Duanne Guess D.O.   On: 08/14/2022 08:54 Within last 24 hrs: No results  found.  Assessment     Patient Active Problem List   Diagnosis Date Noted   History of colonic polyps 05/20/2023   Ventral hernia without obstruction or gangrene 05/17/2023   Pulmonary infiltrate 04/09/2023   Wound of left ankle 03/17/2023   PVD (peripheral vascular disease) (HCC) 12/02/2022   Fatty liver 09/01/2022   Umbilical hernia without obstruction and without gangrene 08/14/2022   Atrial fibrillation with RVR (HCC) 04/14/2022   Osteopenia of neck of left femur 03/07/2022   Mass of right adrenal gland (HCC) 03/04/2021   Monocytosis 02/28/2021   Chronic kidney  disease, stage 3a (HCC) 03/04/2020   Aortic atherosclerosis (HCC) 10/03/2019   Prediabetes 09/04/2019   Vitamin D deficiency 12/29/2018   Osteoarthritis of right hip 12/27/2017   Advanced care planning/counseling discussion 06/07/2017   Benign hypertension 10/28/2015   Nicotine dependence, cigarettes, uncomplicated 07/26/2015   Facet syndrome, lumbar 07/11/2015   Spinal stenosis, lumbar region, with neurogenic claudication 07/11/2015   Status post cervical spinal fusion 07/11/2015   Chronic pain 05/17/2015   Anxiety 05/14/2015   Depression, major, single episode, in partial remission (HCC) 05/14/2015   DDD (degenerative disc disease), lumbar 05/14/2015   Centrilobular emphysema (HCC) 05/14/2015   Hyperlipidemia 05/14/2015   Gastroesophageal reflux disease 05/14/2015   Radiculopathy, lumbar region 05/14/2015   Sleep apnea 05/14/2015   Morbid obesity (HCC) 05/14/2015   Benign essential tremor 05/14/2015    Plan    I made it clear that nothing short of permanent smoking cessation would be acceptable.  Additional weight loss would be helpful.  I believe she understands that.  She understands that we are in the process of attempting to improve her chances to be considered as a candidate for elective surgery.  Want to encourage her that we remain hopeful.  She had a colonoscopy with polypectomy back in 2020, and likely will be due for follow-up colonoscopy.  Face-to-face time spent with the patient and accompanying care providers(if present) was 25 minutes, with more than 50% of the time spent counseling, educating, and coordinating care of the patient.    These notes generated with voice recognition software. I apologize for typographical errors.

## 2023-07-06 ENCOUNTER — Encounter: Payer: Self-pay | Admitting: Surgery

## 2023-07-06 ENCOUNTER — Ambulatory Visit: Payer: PPO | Admitting: Surgery

## 2023-07-06 VITALS — BP 136/65 | HR 69 | Temp 98.9°F | Ht 63.0 in | Wt 203.2 lb

## 2023-07-06 DIAGNOSIS — F17210 Nicotine dependence, cigarettes, uncomplicated: Secondary | ICD-10-CM

## 2023-07-06 DIAGNOSIS — J449 Chronic obstructive pulmonary disease, unspecified: Secondary | ICD-10-CM | POA: Diagnosis not present

## 2023-07-06 DIAGNOSIS — K429 Umbilical hernia without obstruction or gangrene: Secondary | ICD-10-CM

## 2023-07-06 DIAGNOSIS — J432 Centrilobular emphysema: Secondary | ICD-10-CM

## 2023-07-06 NOTE — Patient Instructions (Signed)
If you have any concerns or questions, please feel free to call our office.   Managing the Challenge of Quitting Smoking  Quitting smoking is a physical and mental challenge. You may have cravings, withdrawal symptoms, and temptation to smoke. Before quitting, work with your health care provider to make a plan that can help you manage quitting. Making a plan before you quit may keep you from smoking when you have the urge to smoke while trying to quit. How to manage lifestyle changes Managing stress Stress can make you want to smoke, and wanting to smoke may cause stress. It is important to find ways to manage your stress. You could try some of the following: Practice relaxation techniques. Breathe slowly and deeply, in through your nose and out through your mouth. Listen to music. Soak in a bath or take a shower. Imagine a peaceful place or vacation. Get some support. Talk with family or friends about your stress. Join a support group. Talk with a counselor or therapist. Get some physical activity. Go for a walk, run, or bike ride. Play a favorite sport. Practice yoga.  Medicines Talk with your health care provider about medicines that might help you deal with cravings and make quitting easier for you. Relationships Social situations can be difficult when you are quitting smoking. To manage this, you can: Avoid parties and other social situations where people might be smoking. Avoid alcohol. Leave right away if you have the urge to smoke. Explain to your family and friends that you are quitting smoking. Ask for support and let them know you might be a bit grumpy. Plan activities where smoking is not an option. General instructions Be aware that many people gain weight after they quit smoking. However, not everyone does. To keep from gaining weight, have a plan in place before you quit, and stick to the plan after you quit. Your plan should include: Eating healthy snacks. When you  have a craving, it may help to: Eat popcorn, or try carrots, celery, or other cut vegetables. Chew sugar-free gum. Changing how you eat. Eat small portion sizes at meals. Eat 4-6 small meals throughout the day instead of 1-2 large meals a day. Be mindful when you eat. You should avoid watching television or doing other things that might distract you as you eat. Exercising regularly. Make time to exercise each day. If you do not have time for a long workout, do short bouts of exercise for 5-10 minutes several times a day. Do some form of strengthening exercise, such as weight lifting. Do some exercise that gets your heart beating and causes you to breathe deeply, such as walking fast, running, swimming, or biking. This is very important. Drinking plenty of water or other low-calorie or no-calorie drinks. Drink enough fluid to keep your urine pale yellow.  How to recognize withdrawal symptoms Your body and mind may experience discomfort as you try to get used to not having nicotine in your system. These effects are called withdrawal symptoms. They may include: Feeling hungrier than normal. Having trouble concentrating. Feeling irritable or restless. Having trouble sleeping. Feeling depressed. Craving a cigarette. These symptoms may surprise you, but they are normal to have when quitting smoking. To manage withdrawal symptoms: Avoid places, people, and activities that trigger your cravings. Remember why you want to quit. Get plenty of sleep. Avoid coffee and other drinks that contain caffeine. These may worsen some of your symptoms. How to manage cravings Come up with a plan for how  to deal with your cravings. The plan should include the following: A definition of the specific situation you want to deal with. An activity or action you will take to replace smoking. A clear idea for how this action will help. The name of someone who could help you with this. Cravings usually last for 5-10  minutes. Consider taking the following actions to help you with your plan to deal with cravings: Keep your mouth busy. Chew sugar-free gum. Suck on hard candies or a straw. Brush your teeth. Keep your hands and body busy. Change to a different activity right away. Squeeze or play with a ball. Do an activity or a hobby, such as making bead jewelry, practicing needlepoint, or working with wood. Mix up your normal routine. Take a short exercise break. Go for a quick walk, or run up and down stairs. Focus on doing something kind or helpful for someone else. Call a friend or family member to talk during a craving. Join a support group. Contact a quitline. Where to find support To get help or find a support group: Call the National Cancer Institute's Smoking Quitline: 1-800-QUIT-NOW 347-346-6631) Text QUIT to SmokefreeTXT: 629528 Where to find more information Visit these websites to find more information on quitting smoking: U.S. Department of Health and Human Services: www.smokefree.gov American Lung Association: www.freedomfromsmoking.org Centers for Disease Control and Prevention (CDC): FootballExhibition.com.br American Heart Association: www.heart.org Contact a health care provider if: You want to change your plan for quitting. The medicines you are taking are not helping. Your eating feels out of control or you cannot sleep. You feel depressed or become very anxious. Summary Quitting smoking is a physical and mental challenge. You will face cravings, withdrawal symptoms, and temptation to smoke again. Preparation can help you as you go through these challenges. Try different techniques to manage stress, handle social situations, and prevent weight gain. You can deal with cravings by keeping your mouth busy (such as by chewing gum), keeping your hands and body busy, calling family or friends, or contacting a quitline for people who want to quit smoking. You can deal with withdrawal symptoms by  avoiding places where people smoke, getting plenty of rest, and avoiding drinks that contain caffeine. This information is not intended to replace advice given to you by your health care provider. Make sure you discuss any questions you have with your health care provider. Document Revised: 11/07/2021 Document Reviewed: 11/07/2021 Elsevier Patient Education  2024 Elsevier Inc.   Ventral Hernia  A ventral hernia is a bulge of tissue from inside the abdomen that pushes through a weak area of the muscles that form the front wall of the abdomen. The tissues inside the abdomen are inside a sac (peritoneum). These tissues include the small intestine, large intestine, and the fatty tissue that covers the intestines (omentum). Sometimes, the bulge that forms a hernia contains intestines. Other hernias contain only fat. Ventral hernias do not go away without surgical treatment. There are several types of ventral hernias. You may have: A hernia at an incision site from previous abdominal surgery (incisional hernia). A hernia just above the belly button (epigastric hernia), or at the belly button (umbilical hernia). These types of hernias can develop from heavy lifting or straining. A hernia that comes and goes (reducible hernia). It may be visible only when you lift or strain. This type of hernia can be pushed back into the abdomen (reduced). A hernia that traps abdominal tissue inside the hernia (incarcerated hernia). This type of  hernia does not reduce. A hernia that cuts off blood flow to the tissues inside the hernia (strangulated hernia). The tissues can start to die if this happens. This is a very painful bulge that cannot be reduced. A strangulated hernia is a medical emergency. What are the causes? This condition is caused by abdominal tissue putting pressure on an area of weakness in the abdominal muscles. What increases the risk? The following factors may make you more likely to develop this  condition: Being age 67 or older. Being overweight or obese. Having had previous abdominal surgery, especially if there was an infection after surgery. Having had an injury to the abdominal wall. Frequently lifting or pushing heavy objects. Having had several pregnancies. Having a buildup of fluid inside the abdomen (ascites). Straining to have a bowel movement or to urinate. Having frequent coughing episodes. What are the signs or symptoms? The only symptom of a ventral hernia may be a painless bulge in the abdomen. A reducible hernia may be visible only when you strain, cough, or lift. Other symptoms may include: Dull pain. A feeling of pressure. Signs and symptoms of a strangulated hernia may include: Increasing pain. Nausea and vomiting. Pain when pressing on the hernia. The skin over the hernia turning red or purple. Constipation. Blood in the stool (feces). How is this diagnosed? This condition may be diagnosed based on: Your symptoms. Your medical history. A physical exam. You may be asked to cough or strain while standing. These actions increase the pressure inside your abdomen and force the hernia through the opening in your muscles. Your health care provider may try to reduce the hernia by gently pushing the hernia back in. Imaging studies, such as an ultrasound or CT scan. How is this treated? This condition is treated with surgery. If you have a strangulated hernia, surgery is done as soon as possible. If your hernia is small and not incarcerated, you may be asked to lose some weight before surgery. Follow these instructions at home: Follow instructions from your health care provider about eating or drinking restrictions. If you are overweight, your health care provider may recommend that you increase your activity level and eat a healthier diet. Do not lift anything that is heavier than 10 lb (4.5 kg), or the limit that you are told, until your health care provider says  that it is safe. Return to your normal activities as told by your health care provider. Ask your health care provider what activities are safe for you. You may need to avoid activities that increase pressure on your hernia. Take over-the-counter and prescription medicines only as told by your health care provider. Keep all follow-up visits. This is important. Contact a health care provider if: Your hernia gets larger. Your hernia becomes painful. Get help right away if: Your hernia becomes increasingly painful. You have pain along with any of the following: Changes in skin color in the area of the hernia. Nausea. Vomiting. Fever. These symptoms may represent a serious problem that is an emergency. Do not wait to see if the symptoms will go away. Get medical help right away. Call your local emergency services (911 in the [place] Do not drive yourself to the hospital. Summary A ventral hernia is a bulge of tissue from inside the abdomen that pushes through a weak area of the muscles that form the front wall of the abdomen. This condition is treated with surgery, which may be urgent depending on your hernia. Do not lift anything that  is heavier than 10 lb (4.5 kg), and follow activity instructions from your health care provider. This information is not intended to replace advice given to you by your health care provider. Make sure you discuss any questions you have with your health care provider. Document Revised: 07/05/2020 Document Reviewed: 07/05/2020 Elsevier Patient Education  2024 [employer].

## 2023-07-21 ENCOUNTER — Other Ambulatory Visit: Payer: Self-pay | Admitting: Nurse Practitioner

## 2023-07-22 NOTE — Telephone Encounter (Signed)
Requested Prescriptions  Pending Prescriptions Disp Refills   Tiotropium Bromide Monohydrate (SPIRIVA RESPIMAT) 2.5 MCG/ACT AERS [Pharmacy Med Name: SPIRIVA RESPIMAT 2.5 MCG INH] 4 g 2    Sig: INHALE 2 SPRAY(S) BY MOUTH ONCE DAILY     Pulmonology:  Anticholinergic Agents Passed - 07/21/2023  1:40 AM      Passed - Valid encounter within last 12 months    Recent Outpatient Visits           2 months ago Depression, major, single episode, in partial remission (HCC)   [place] [facility] [place], [place] T, NP   3 months ago PVD (peripheral vascular disease) (HCC)   [place] [facility] [place], Corrie Dandy T, NP   4 months ago Chronic kidney disease, stage 3a (HCC)   [place] [facility] [place], Jolene T, NP   7 months ago Cellulitis of right lower leg   [place] [facility] [place], [place] T, NP   7 months ago Centrilobular emphysema (HCC)   [place] [facility] [place], Dorie Rank, NP       Future Appointments             In 3 weeks Cannady, Dorie Rank, NP [facility]   In 1 month Raechel Chute, MD [facility]

## 2023-08-10 ENCOUNTER — Other Ambulatory Visit: Payer: Self-pay | Admitting: Nurse Practitioner

## 2023-08-12 NOTE — Telephone Encounter (Signed)
Refused because being requested too soon, the Buspar 10 mg.

## 2023-08-15 NOTE — Patient Instructions (Signed)
Be Involved in Caring For Your Health:  Taking Medications When medications are taken as directed, they can greatly improve your health. But if they are not taken as prescribed, they may not work. In some cases, not taking them correctly can be harmful. To help ensure your treatment remains effective and safe, understand your medications and how to take them. Bring your medications to each visit for review by your provider.  Your lab results, notes, and after visit summary will be available on My Chart. We strongly encourage you to use this feature. If lab results are abnormal the clinic will contact you with the appropriate steps. If the clinic does not contact you assume the results are satisfactory. You can always view your results on My Chart. If you have questions regarding your health or results, please contact the clinic during office hours. You can also ask questions on My Chart.  We at [facility] are grateful that you chose [place] to provide your care. We strive to provide evidence-based and compassionate care and are always looking for feedback. If you get a survey from the clinic please complete this so we can hear your opinions.  Eating Plan for Chronic Obstructive Pulmonary Disease Chronic obstructive pulmonary disease (COPD) causes symptoms such as shortness of breath, coughing, and chest discomfort. These symptoms can make it difficult to eat enough to maintain a healthy weight. Generally, people with COPD should eat a diet that is high in calories, protein, and other nutrients to maintain body weight and to keep the lungs as healthy as possible. Depending on the medicines you take and other health conditions you may have, your health care provider may give you additional recommendations on what to eat or avoid. Talk with your health care provider about your goals for body weight, and work with a dietitian to develop an eating plan that is right for you. What are tips for  following this plan? Reading food labels  Avoid foods with more than 300 milligrams (mg) of salt (sodium) per serving. Choose foods that contain at least 4 grams (g) of fiber per serving. Try to eat 20-30 g of fiber each day. Choose foods that are high in calories and protein, such as nuts, beans, yogurt, and cheese. Shopping Do not buy foods labeled as diet, low-calorie, or low-fat. If you are able to eat dairy products: Avoid low-fat or skim milk. Buy dairy products that have at least 2% fat. Buy nutritional supplement drinks. Buy grains and prepared foods labeled as enriched or fortified. Consider buying low-sodium, pre-made foods to conserve energy for eating. Cooking Add dry milk or protein powder to smoothies. Cook with healthy fats, such as olive oil, canola oil, sunflower oil, and grapeseed oil. Add oil, butter, cream cheese, or nut butters to foods to increase fat and calories. To make foods easier to chew and swallow: Cook vegetables, pasta, and rice until soft. Cut or grind meat into very small pieces. Dip breads in liquid. Meal planning  Eat when you feel hungry. Eat 5-6 small meals throughout the day. Drink 6-8 glasses of water each day. Do not drink liquids with meals. Drink liquids at the end of the meal to avoid feeling full too quickly. Eat a variety of fruits and vegetables every day. Ask for assistance from family or friends with planning and preparing meals as needed. Avoid foods that cause you to feel bloated, such as carbonated drinks, fried foods, beans, broccoli, cabbage, and apples. For older adults, ask your local  agency on aging whether you are eligible for meal assistance programs, such as Meals on Wheels. Lifestyle  Do not smoke. Eat slowly. Take small bites and chew food well before swallowing. Do not overeat. This may make it more difficult to breathe after eating. Sit up while eating. If needed, continue to use supplemental oxygen while  eating. Rest or relax for 30 minutes before and after eating. Monitor your weight as told by your health care provider. Exercise as told by your health care provider. What foods should I eat? Fruits All fresh, dried, canned, or frozen fruits that do not cause gas. Vegetables All fresh, canned (no salt added), or frozen vegetables that do not cause gas. Grains Whole-grain bread. Enriched whole-grain pasta. Fortified whole-grain cereals. Fortified rice. Quinoa. Meats and other proteins Lean meat. Poultry. Fish. Dried beans. Unsalted nuts. Tofu. Eggs. Nut butters. Dairy Whole or 2% milk. Cheese. Yogurt. Fats and oils Olive oil. Canola oil. Butter. Margarine. Beverages Water. Vegetable juice (no salt added). Decaffeinated coffee. Decaffeinated or herbal tea. Seasonings and condiments Fresh or dried herbs. Low-salt or salt-free seasonings. Low-sodium soy sauce. The items listed above may not be a complete list of foods and beverages you can eat. Contact a dietitian for more information. What foods should I avoid? Fruits Fruits that cause gas, such as apples or melon. Vegetables Vegetables that cause gas, such as broccoli, Brussels sprouts, cabbage, cauliflower, and onions. Canned vegetables with added salt. Meats and other proteins Fried meat. Salt-cured meat. Processed meat. Dairy Fat-free or low-fat milk, yogurt, or cheese. Processed cheese. Beverages Carbonated drinks. Caffeinated drinks, such as coffee, tea, and soft drinks. Juice. Alcohol. Vegetable juice with added salt. Seasonings and condiments Salt. Seasoning mixes with salt. Soy sauce. Rosita Fire. Other foods Clear soup or broth. Fried foods. Prepared frozen meals. The items listed above may not be a complete list of foods and beverages you should avoid. Contact a dietitian for more information. Summary COPD symptoms can make it difficult to eat enough to maintain a healthy weight. A COPD eating plan can help you maintain  your body weight and keep your lungs as healthy as possible. Eat a diet that is high in calories, protein, and other nutrients. Read labels to make sure that you are getting the right nutrients. Cook foods to make them easier to chew and swallow. Eat 5-6 small meals throughout the day, and avoid foods that cause gas or make you feel bloated. This information is not intended to replace advice given to you by your health care provider. Make sure you discuss any questions you have with your health care provider. Document Revised: 09/24/2020 Document Reviewed: 09/24/2020 Elsevier Patient Education  2024 [employer].

## 2023-08-17 ENCOUNTER — Ambulatory Visit (INDEPENDENT_AMBULATORY_CARE_PROVIDER_SITE_OTHER): Payer: PPO | Admitting: Nurse Practitioner

## 2023-08-17 ENCOUNTER — Encounter: Payer: Self-pay | Admitting: Nurse Practitioner

## 2023-08-17 VITALS — BP 124/67 | HR 59 | Temp 98.7°F | Ht 63.0 in | Wt 211.0 lb

## 2023-08-17 DIAGNOSIS — K439 Ventral hernia without obstruction or gangrene: Secondary | ICD-10-CM

## 2023-08-17 DIAGNOSIS — F419 Anxiety disorder, unspecified: Secondary | ICD-10-CM

## 2023-08-17 DIAGNOSIS — J432 Centrilobular emphysema: Secondary | ICD-10-CM

## 2023-08-17 DIAGNOSIS — I10 Essential (primary) hypertension: Secondary | ICD-10-CM | POA: Diagnosis not present

## 2023-08-17 DIAGNOSIS — N1831 Chronic kidney disease, stage 3a: Secondary | ICD-10-CM | POA: Diagnosis not present

## 2023-08-17 DIAGNOSIS — I4891 Unspecified atrial fibrillation: Secondary | ICD-10-CM | POA: Diagnosis not present

## 2023-08-17 DIAGNOSIS — Z23 Encounter for immunization: Secondary | ICD-10-CM | POA: Diagnosis not present

## 2023-08-17 DIAGNOSIS — F17210 Nicotine dependence, cigarettes, uncomplicated: Secondary | ICD-10-CM

## 2023-08-17 DIAGNOSIS — R7303 Prediabetes: Secondary | ICD-10-CM

## 2023-08-17 DIAGNOSIS — F324 Major depressive disorder, single episode, in partial remission: Secondary | ICD-10-CM | POA: Diagnosis not present

## 2023-08-17 DIAGNOSIS — E782 Mixed hyperlipidemia: Secondary | ICD-10-CM | POA: Diagnosis not present

## 2023-08-17 DIAGNOSIS — I739 Peripheral vascular disease, unspecified: Secondary | ICD-10-CM | POA: Diagnosis not present

## 2023-08-17 MED ORDER — GABAPENTIN 800 MG PO TABS: 800.0000 mg | ORAL_TABLET | Freq: Two times a day (BID) | ORAL | 4 refills | Status: DC

## 2023-08-17 MED ORDER — FLUTICASONE PROPIONATE 50 MCG/ACT NA SUSP: 2.0000 | Freq: Every day | NASAL | 6 refills | Status: DC

## 2023-08-17 MED ORDER — CLONAZEPAM 0.5 MG PO TABS: 0.5000 mg | ORAL_TABLET | Freq: Every day | ORAL | 2 refills | Status: DC | PRN

## 2023-08-17 MED ORDER — LIDOCAINE 5 % EX PTCH: 1.0000 | MEDICATED_PATCH | CUTANEOUS | 2 refills | Status: DC

## 2023-08-17 NOTE — Assessment & Plan Note (Addendum)
Chronic, stable.  BP well below goal on check.  Continue current medication regimen and adjust as needed -- is taking BB and Lasix only at this time.  Previously took Telmisartan and HCTZ.  Recommend [name] monitor BP at home daily and document for provider + focus on DASH diet.  LABS: CMP and CBC.  Urine ALB 29 December 2022, may benefit from return to low dose ARB in future.

## 2023-08-17 NOTE — Assessment & Plan Note (Signed)
Chronic, ongoing.  Continue statin and adjust as needed -- Atorvastatin 80 MG.  Lipid panel today.

## 2023-08-17 NOTE — Assessment & Plan Note (Signed)
Chronic, ongoing.  Continue current medication regimen and adjust as needed.  Not O2 dependent at this time.  Continue to recommend complete smoking cessation. Continue collaboration with pulmonary, has upcoming testing.

## 2023-08-17 NOTE — Assessment & Plan Note (Signed)
Refer to depression plan of care. 

## 2023-08-17 NOTE — Assessment & Plan Note (Signed)
Ongoing.  GFR in the 50's, recent levels improved some.  Recheck today.  Reminded to avoid NSAIDs.  May need to adjust Gabapentin based on labs, will review and determine need to reduce -- discussed with patient.  Labs today: CMP and CBC.  Increase fluid intake at home.

## 2023-08-17 NOTE — Assessment & Plan Note (Signed)
Following with general surgery, was cleared for surgery by cardiology and pulmonary, however general surgery wishes to hold off until patient quits smoking and loses more weight to decrease risk during surgery.

## 2023-08-17 NOTE — Assessment & Plan Note (Signed)
Chronic, stable.  Diagnosed 04/14/22 in hospital.  At this time HR stable and no symptoms.  Continue collaboration with cardiology and current medication regimen, adjust as needed.

## 2023-08-17 NOTE — Assessment & Plan Note (Signed)
I have recommended complete cessation of tobacco use. I have discussed various options available for assistance with tobacco cessation including over the counter methods (Nicotine gum, patch and lozenges). We also discussed prescription options (Chantix, Nicotine Inhaler / Nasal Spray). The patient is not interested in pursuing any prescription tobacco cessation options at this time.  Continue annual lung screening.

## 2023-08-17 NOTE — Assessment & Plan Note (Signed)
Ongoing with A1c ranging from 5.7 to 5.9%, recent 5.3% with Ozempic on board.  Insurance no longer covering.  Will try to get [facility] coverage in new year with her cardiac health and new indications.  Educated her on this.  No history of thyroid cancer in patient or her family.

## 2023-08-17 NOTE — Progress Notes (Signed)
BP 124/67   Pulse (!) 59   Temp 98.7 F (37.1 C) (Oral)   Ht 5\' 3"  (1.6 m)   Wt 211 lb (95.7 kg)   LMP  (LMP Unknown)   SpO2 96%   BMI 37.38 kg/m    Subjective:    Patient ID: [name], female    DOB: 1956-03-18, 67 y.o.   MRN: [id number]  HPI: [name] is a 67 y.o. female  Chief Complaint  Patient presents with   Anxiety   Chronic Kidney Disease   COPD   Hyperlipidemia   Hypertension   ATRIAL FIBRILLATION Last cardiology visit on 07/05/23.  Continues Eliquis, Sotalol, and Lasix + Atorvastatin 80 MG daily for cholesterol. Saw cardiology last 07/05/23.  Remains off Ozempic as insurance stopped covering (has been off for several months)  -- had been on for >1 year and noted drastic weight loss and improvement in quality of life with this on board -- currently has gained back 20 pounds since stopping.  Started 12/10/21 at 256 lbs and went down to 188 lbs = total 68 pounds loss.  Her goal is future hernia repair, saw general surgery who recommends not performing at this time -- until she quits smoking and loses more weight. Atrial fibrillation status: stable Satisfied with current treatment: yes  Medication side effects:  no Medication compliance: good compliance Etiology of atrial fibrillation: unknown Palpitations:  no Chest pain:  no Dyspnea on exertion: no Orthopnea:  no Syncope:  no Edema: occasionally to right leg per baseline Ventricular rate control: Sotalol Anti-coagulation: long acting   COPD Uses Spiriva only. Continues to smoke 1 PPD, sometimes more.  Lung screening, last check on 10/21/22 which noted ongoing aortic atherosclerosis and centrilobular emphysema.  Last saw pulmonary 05/24/23. Would like Flonase for itchy nose. COPD status: stable Satisfied with current treatment?: yes Oxygen use: no Dyspnea frequency: none recently Cough frequency: occasional at baseline Rescue inhaler frequency: none recently Limitation of activity: no Productive  cough: none Last Spirometry: with pulmonary Pneumovax: Up to Date Influenza: Up to Date  CHRONIC KIDNEY DISEASE Last levels were improving with Ozempic on board. CKD status: stable Medications renally dose: yes Previous renal evaluation: no Pneumovax:  Up to Date Influenza Vaccine:  Up to Date   DEPRESSION Continues Effexor XR 150 MG daily, has been on for many years, plus Buspar 20 MG TID.  Continues Klonopin 0.5 MG PRN (last fill 07/14/23) -- is taking every day.  Chose to take this over pain medications.  Takes 10 MG Melatonin at night. Pt is aware of risks of benzo medication use to include increased sedation, respiratory suppression, falls, dependence and cardiovascular events.  Pt would like to continue treatment as benefit determined to outweigh risk.  No longer follows with Dr. Welton Flakes with pain management.  Does continue on Gabapentin 800 MG BID. Mood status: stable Satisfied with current treatment?: yes Symptom severity: moderate  Duration of current treatment : chronic Side effects: no Medication compliance: good compliance Psychotherapy/counseling: none Depressed mood: no Anxious mood: at night time Anhedonia: no Significant weight loss or gain: no Insomnia: yes, falling asleep, gets panicked -- uses CPAP 100% of the time Fatigue: no Feelings of worthlessness or guilt: no Impaired concentration/indecisiveness: no Suicidal ideations: no Hopelessness: no Crying spells: no    08/17/2023    2:47 PM 05/17/2023    2:48 PM 04/14/2023    3:47 PM 03/17/2023    3:31 PM 01/12/2023    3:36 PM  Depression screen PHQ 2/9  Decreased Interest 0 2 0 0 0  Down, Depressed, Hopeless 0 0 0 0 0  PHQ - 2 Score 0 2 0 0 0  Altered sleeping 0 2 2 0 0  Tired, decreased energy 0 0 0 0 0  Change in appetite 0 0 0 0 0  Feeling bad or failure about yourself  0 0 0 0 0  Trouble concentrating 0 0 0 0 0  Moving slowly or fidgety/restless 0 0 0 0 0  Suicidal thoughts 0 0 0 0 0  PHQ-9 Score 0 4 2 0  0  Difficult doing work/chores Not difficult at all   Not difficult at all Not difficult at all       08/17/2023    2:47 PM 05/17/2023    2:48 PM 04/14/2023    3:47 PM 03/17/2023    3:32 PM  GAD 7 : Generalized Anxiety Score  Nervous, Anxious, on Edge 3 3 2 3   Control/stop worrying 0 0 0 0  Worry too much - different things 0 0 0 0  Trouble relaxing 1 3 2 3   Restless 0 0 2 0  Easily annoyed or irritable 0 0 0 0  Afraid - awful might happen 0 0 0 0  Total GAD 7 Score 4 6 6 6   Anxiety Difficulty Not difficult at all  Not difficult at all Not difficult at all   Relevant past medical, surgical, family and social history reviewed and updated as indicated. Interim medical history since our last visit reviewed. Allergies and medications reviewed and updated.  Review of Systems  Constitutional:  Negative for activity change, appetite change, diaphoresis, fatigue and fever.  HENT:  Negative for ear discharge and ear pain.   Respiratory:  Negative for cough, chest tightness, shortness of breath and wheezing.   Cardiovascular:  Negative for chest pain, palpitations and leg swelling.  Gastrointestinal: Negative.   Neurological: Negative.   Psychiatric/Behavioral:  Negative for decreased concentration, self-injury, sleep disturbance and suicidal ideas. The patient is not nervous/anxious.    Per HPI unless specifically indicated above     Objective:    BP 124/67   Pulse (!) 59   Temp 98.7 F (37.1 C) (Oral)   Ht 5\' 3"  (1.6 m)   Wt 211 lb (95.7 kg)   LMP  (LMP Unknown)   SpO2 96%   BMI 37.38 kg/m   Wt Readings from Last 3 Encounters:  08/17/23 211 lb (95.7 kg)  07/06/23 203 lb 3.2 oz (92.2 kg)  05/24/23 194 lb 12.8 oz (88.4 kg)    Physical Exam Vitals and nursing note reviewed.  Constitutional:      General: She is awake. She is not in acute distress.    Appearance: She is well-developed. She is obese. She is not ill-appearing.  HENT:     Head: Normocephalic.     Right Ear:  Hearing, tympanic membrane, ear canal and external ear normal.     Left Ear: Hearing, tympanic membrane, ear canal and external ear normal.  Eyes:     General: Lids are normal.        Right eye: No discharge.        Left eye: No discharge.     Conjunctiva/sclera: Conjunctivae normal.     Pupils: Pupils are equal, round, and reactive to light.  Neck:     Vascular: No carotid bruit.  Cardiovascular:     Rate and Rhythm: Regular rhythm. Bradycardia present.  Heart sounds: Normal heart sounds. No murmur heard.    No gallop.  Pulmonary:     Effort: Pulmonary effort is normal. No accessory muscle usage or respiratory distress.     Breath sounds: Decreased breath sounds and wheezing present.     Comments: Diminished throughout per baseline with occasional expiratory wheezes. Abdominal:     General: Bowel sounds are normal.     Palpations: Abdomen is soft.  Musculoskeletal:     Cervical back: Normal range of motion and neck supple.     Right lower leg: 1+ Edema present.     Left lower leg: 1+ Edema present.  Skin:    General: Skin is warm and dry.  Neurological:     Mental Status: She is alert and oriented to person, place, and time.  Psychiatric:        Attention and Perception: Attention normal.        Mood and Affect: Mood normal.        Speech: Speech normal.        Behavior: Behavior normal. Behavior is cooperative.        Thought Content: Thought content normal.    Results for orders placed or performed in visit on 05/17/23  [id number] 11+Oxyco+Alc+Crt-Bund  Result Value Ref Range   Ethanol Negative Cutoff=0.020 %   Amphetamines, Urine Negative Cutoff=1000 ng/mL   Barbiturate Negative Cutoff=200 ng/mL   BENZODIAZ UR QL Negative Cutoff=200 ng/mL   Cannabinoid Quant, Ur Negative Cutoff=50 ng/mL   Cocaine (Metabolite) Negative Cutoff=300 ng/mL   OPIATE SCREEN URINE Negative Cutoff=300 ng/mL   Oxycodone/Oxymorphone, Urine Negative Cutoff=300 ng/mL   Phencyclidine Negative  Cutoff=25 ng/mL   Methadone Screen, Urine Negative Cutoff=300 ng/mL   Propoxyphene Negative Cutoff=300 ng/mL   Meperidine Negative Cutoff=200 ng/mL   Tramadol Negative Cutoff=200 ng/mL   Creatinine 20.8 20.0 - 300.0 mg/dL   pH, Urine 5.4 4.5 - 8.9      Assessment & Plan:   Problem List Items Addressed This Visit       Cardiovascular and Mediastinum   Atrial fibrillation with RVR (HCC)    Chronic, stable.  Diagnosed 04/14/22 in hospital.  At this time HR stable and no symptoms.  Continue collaboration with cardiology and current medication regimen, adjust as needed.        Benign hypertension    Chronic, stable.  BP well below goal on check.  Continue current medication regimen and adjust as needed -- is taking BB and Lasix only at this time.  Previously took Telmisartan and HCTZ.  Recommend she monitor BP at home daily and document for provider + focus on DASH diet.  LABS: CMP and CBC.  Urine ALB 29 December 2022, may benefit from return to low dose ARB in future.          Respiratory   Centrilobular emphysema (HCC) - Primary    Chronic, ongoing.  Continue current medication regimen and adjust as needed.  Not O2 dependent at this time.  Continue to recommend complete smoking cessation. Continue collaboration with pulmonary, has upcoming testing.      Relevant Medications   fluticasone (FLONASE) 50 MCG/ACT nasal spray   Other Relevant Orders   CBC with Differential/Platelet     Genitourinary   Chronic kidney disease, stage 3a (HCC)    Ongoing.  GFR in the 50's, recent levels improved some.  Recheck today.  Reminded to avoid NSAIDs.  May need to adjust Gabapentin based on labs, will review and determine need to reduce --  discussed with patient.  Labs today: CMP and CBC.  Increase fluid intake at home.      Relevant Orders   Comprehensive metabolic panel   CBC with Differential/Platelet     Other   Prediabetes (Chronic)    Ongoing with A1c ranging from 5.7 to 5.9%, recent 5.3%  with Ozempic on board.  Insurance no longer covering.  Will try to get [facility] coverage in new year with her cardiac health and new indications.  Educated her on this.  No history of thyroid cancer in patient or her family.        Relevant Orders   HgB A1c   Anxiety    Refer to depression plan of care.      Depression, major, single episode, in partial remission (HCC)    Chronic, stable at this time. Denies SI/HI.  Continue Effexor XL 150 MG daily + Buspar and Vistaril.  Continue at lowest dosing, Klonopin 0.5 MG daily as needed only -- take only as needed.  Aware of need for every 3 month visit, yearly drug screen, and controlled substance agreement.  Controlled substance agreement up to date and UDS due 05/16/24.  Consider Prozac, as worked well in past, but this could interact with cardiac medications (QT prolongation) - will not initiate this at this time.  Consider psychiatry referral in future.  Monitor BP with Effexor.      Hyperlipidemia    Chronic, ongoing.  Continue statin and adjust as needed -- Atorvastatin 80 MG.  Lipid panel today.      Relevant Orders   Comprehensive metabolic panel   Lipid Panel w/o Chol/HDL Ratio   Morbid obesity (HCC)    BMI 37.38 with HTN and COPD, A-Fib.  Recommended eating smaller high protein, low fat meals more frequently and exercising 30 mins a day 5 times a week with a goal of 10-15lb weight loss in the next 3 months. Patient voiced their understanding and motivation to adhere to these recommendations.  Tolerated Ozempic with total 68 pounds loss, but insurance no longer covering.  Will try to get Wegovy covered in new year due to cardiac indications.  Educated her on this.  No history of thyroid cancer in patient or her family.  Currently she has gained while off medication.       Nicotine dependence, cigarettes, uncomplicated    I have recommended complete cessation of tobacco use. I have discussed various options available for assistance with  tobacco cessation including over the counter methods (Nicotine gum, patch and lozenges). We also discussed prescription options (Chantix, Nicotine Inhaler / Nasal Spray). The patient is not interested in pursuing any prescription tobacco cessation options at this time.  Continue annual lung screening.       Ventral hernia without obstruction or gangrene    Following with general surgery, was cleared for surgery by cardiology and pulmonary, however general surgery wishes to hold off until patient quits smoking and loses more weight to decrease risk during surgery.      Other Visit Diagnoses     Flu vaccine need       Flu vaccine in office today, educated on this.   Relevant Orders   Flu Vaccine Trivalent High Dose (Fluad) (Completed)        Follow up plan: Return in about 3 months (around 11/16/2023) for ANXIETY AND DEPRESSION.

## 2023-08-17 NOTE — Assessment & Plan Note (Signed)
Chronic, stable at this time. Denies SI/HI.  Continue Effexor XL 150 MG daily + Buspar and Vistaril.  Continue at lowest dosing, Klonopin 0.5 MG daily as needed only -- take only as needed.  Aware of need for every 3 month visit, yearly drug screen, and controlled substance agreement.  Controlled substance agreement up to date and UDS due 05/16/24.  Consider Prozac, as worked well in past, but this could interact with cardiac medications (QT prolongation) - will not initiate this at this time.  Consider psychiatry referral in future.  Monitor BP with Effexor.

## 2023-08-17 NOTE — Assessment & Plan Note (Signed)
BMI 37.38 with HTN and COPD, A-Fib.  Recommended eating smaller high protein, low fat meals more frequently and exercising 30 mins a day 5 times a week with a goal of 10-15lb weight loss in the next 3 months. Patient voiced their understanding and motivation to adhere to these recommendations.  Tolerated Ozempic with total 68 pounds loss, but insurance no longer covering.  Will try to get Wegovy covered in new year due to cardiac indications.  Educated her on this.  No history of thyroid cancer in patient or her family.  Currently she has gained while off medication.

## 2023-08-24 ENCOUNTER — Ambulatory Visit: Payer: PPO | Attending: Student in an Organized Health Care Education/Training Program

## 2023-08-24 DIAGNOSIS — R918 Other nonspecific abnormal finding of lung field: Secondary | ICD-10-CM

## 2023-08-24 DIAGNOSIS — J984 Other disorders of lung: Secondary | ICD-10-CM | POA: Diagnosis not present

## 2023-08-24 DIAGNOSIS — F17210 Nicotine dependence, cigarettes, uncomplicated: Secondary | ICD-10-CM | POA: Diagnosis not present

## 2023-08-24 LAB — PULMONARY FUNCTION TEST ARMC ONLY
DL/VA % pred: 63 %
DL/VA: 2.66 ml/min/mmHg/L
DLCO unc % pred: 58 %
DLCO unc: 11.07 ml/min/mmHg
FEF 25-75 Post: 1.08 L/s
FEF 25-75 Pre: 1.08 L/s
FEF2575-%Change-Post: 0 %
FEF2575-%Pred-Post: 54 %
FEF2575-%Pred-Pre: 54 %
FEV1-%Change-Post: 0 %
FEV1-%Pred-Post: 75 %
FEV1-%Pred-Pre: 74 %
FEV1-Post: 1.7 L
FEV1-Pre: 1.7 L
FEV1FVC-%Change-Post: 2 %
FEV1FVC-%Pred-Pre: 93 %
FEV6-%Change-Post: 0 %
FEV6-%Pred-Post: 80 %
FEV6-%Pred-Pre: 81 %
FEV6-Post: 2.3 L
FEV6-Pre: 2.31 L
FEV6FVC-%Change-Post: 0 %
FEV6FVC-%Pred-Post: 103 %
FEV6FVC-%Pred-Pre: 102 %
FVC-%Change-Post: -2 %
FVC-%Pred-Post: 77 %
FVC-%Pred-Pre: 79 %
FVC-Post: 2.31 L
FVC-Pre: 2.37 L
Post FEV1/FVC ratio: 74 %
Post FEV6/FVC ratio: 100 %
Pre FEV1/FVC ratio: 72 %
Pre FEV6/FVC Ratio: 99 %
RV % pred: 266 %
RV: 5.55 L
TLC % pred: 161 %
TLC: 7.9 L

## 2023-08-24 MED ORDER — ALBUTEROL SULFATE (2.5 MG/3ML) 0.083% IN NEBU
2.5000 mg | INHALATION_SOLUTION | Freq: Once | RESPIRATORY_TRACT | Status: AC
  Administered 2023-08-24: 2.5 mg via RESPIRATORY_TRACT
  Filled 2023-08-24: qty 3

## 2023-08-30 ENCOUNTER — Ambulatory Visit (INDEPENDENT_AMBULATORY_CARE_PROVIDER_SITE_OTHER): Payer: PPO | Admitting: Student in an Organized Health Care Education/Training Program

## 2023-08-30 ENCOUNTER — Encounter: Payer: Self-pay | Admitting: Student in an Organized Health Care Education/Training Program

## 2023-08-30 VITALS — BP 128/56 | HR 61 | Temp 97.6°F | Ht 63.0 in | Wt 216.8 lb

## 2023-08-30 DIAGNOSIS — R918 Other nonspecific abnormal finding of lung field: Secondary | ICD-10-CM | POA: Diagnosis not present

## 2023-08-30 DIAGNOSIS — J439 Emphysema, unspecified: Secondary | ICD-10-CM | POA: Diagnosis not present

## 2023-08-30 MED ORDER — STIOLTO RESPIMAT 2.5-2.5 MCG/ACT IN AERS
2.0000 | INHALATION_SPRAY | Freq: Every day | RESPIRATORY_TRACT | 12 refills | Status: DC
Start: 2023-08-30 — End: 2024-08-29

## 2023-08-30 MED ORDER — ALBUTEROL SULFATE (2.5 MG/3ML) 0.083% IN NEBU
2.5000 mg | INHALATION_SOLUTION | Freq: Two times a day (BID) | RESPIRATORY_TRACT | 12 refills | Status: AC
Start: 2023-08-30 — End: ?

## 2023-08-30 MED ORDER — SODIUM CHLORIDE 3 % IN NEBU
INHALATION_SOLUTION | Freq: Two times a day (BID) | RESPIRATORY_TRACT | 12 refills | Status: DC
Start: 2023-08-30 — End: 2024-02-15

## 2023-08-30 NOTE — Patient Instructions (Signed)
We will start a pulmonary clearance regimen that will include the following:  -Start with using the hypertonic saline by nebulizer -Then follow the albuterol by nebulizer -Then use the flutter device: please do at least 10-15 blows into the device for at least 2 to 3 seconds each -And finish up with using the incentive spirometer; use for at least 10 times -After doing these, attempt to cough.  Please do these twice a day

## 2023-08-30 NOTE — Progress Notes (Signed)
Assessment & Plan:   #COPD - Emphysema - GOLD 2B #Bronchiectasis  PFT's consistent with PRiSM, with FEV1/FVC being at 74%, while FEV1 being at 74% predicted. The flow volume loop does suggest an obstructive pattern consistent with her history of smoking and with emphysema noted on her chest CT. Given this, I will add LABA to her regimen and switch her from Spiriva (LAMA) to [employer] (LAMA/LABA).  There is also signs of bronchiectasis in the lingula, in the same area of consolidation previously noted on imaging. Suspect this is a sequelae of the infection, not the cause of it, and I will add a pulmonary toilet regimen to her treatment plan to include hypertonic saline, albuterol, a flutter device, and an incentive spirometer.  - Tiotropium Bromide-Olodaterol (STIOLTO RESPIMAT) 2.5-2.5 MCG/ACT AERS; Inhale 2 puffs into the lungs daily.  Dispense: 120 each; Refill: 12 - AMB REFERRAL FOR DME - sodium chloride HYPERTONIC 3 % nebulizer solution; Take by nebulization in the morning and at bedtime.  Dispense: 750 mL; Refill: 12 - albuterol (PROVENTIL) (2.5 MG/3ML) 0.083% nebulizer solution; Take 3 mLs (2.5 mg total) by nebulization in the morning and at bedtime.  Dispense: 75 mL; Refill: 12  #Pulmonary infiltrate   Initially presented for the evaluation of a lingular infiltrate with mass like consolidation, tree-in-bud nodularity, as well as bronchial wall thickening. Bronchoscopy with BAL grew H. Influenza, and the patient received a course of Cefpodoxime for treatment, with improvement. This is improved on repeat imaging. Patient to continue with yearly LDCT for lung cancer screening.  Return in about 6 months (around 02/27/2024).  I spent 30 minutes caring for this patient today, including preparing to see the patient, obtaining a medical history , reviewing a separately obtained history, performing a medically appropriate examination and/or evaluation, counseling and educating the  patient/family/caregiver, ordering medications, tests, or procedures, documenting clinical information in the electronic health record, and independently interpreting results (not separately reported/billed) and communicating results to the patient/family/caregiver  Raechel Chute, MD [facility] 08/30/2023 2:11 PM    End of visit medications:  Meds ordered this encounter  Medications   Tiotropium Bromide-Olodaterol (STIOLTO RESPIMAT) 2.5-2.5 MCG/ACT AERS    Sig: Inhale 2 puffs into the lungs daily.    Dispense:  120 each    Refill:  12   sodium chloride HYPERTONIC 3 % nebulizer solution    Sig: Take by nebulization in the morning and at bedtime.    Dispense:  750 mL    Refill:  12   albuterol (PROVENTIL) (2.5 MG/3ML) 0.083% nebulizer solution    Sig: Take 3 mLs (2.5 mg total) by nebulization in the morning and at bedtime.    Dispense:  75 mL    Refill:  12     Current Outpatient Medications:    acetaminophen (TYLENOL) 500 MG tablet, Take 1,000 mg by mouth in the morning and at bedtime., Disp: , Rfl:    albuterol (PROAIR HFA) 108 (90 Base) MCG/ACT inhaler, Inhale 2 puffs into the lungs every 6 (six) hours as needed for wheezing or shortness of breath., Disp: 18 g, Rfl: 4   albuterol (PROVENTIL) (2.5 MG/3ML) 0.083% nebulizer solution, Take 3 mLs (2.5 mg total) by nebulization in the morning and at bedtime., Disp: 75 mL, Rfl: 12   apixaban (ELIQUIS) 5 MG TABS tablet, Take 1 tablet (5 mg total) by mouth 2 (two) times daily., Disp: 60 tablet, Rfl: 1   ascorbic acid (VITAMIN C) 500 MG tablet, Take 500 mg  by mouth daily., Disp: , Rfl:    atorvastatin (LIPITOR) 80 MG tablet, Take 1 tablet (80 mg total) by mouth daily. (Patient taking differently: Take 80 mg by mouth at bedtime.), Disp: 90 tablet, Rfl: 4   baclofen (LIORESAL) 10 MG tablet, Take 0.5 tablets (5 mg total) by mouth 3 (three) times daily., Disp: 135 each, Rfl: 4   busPIRone (BUSPAR) 10 MG tablet, TAKE 2 TABLETS  BY MOUTH 3 TIMES DAILY., Disp: 540 tablet, Rfl: 1   Calcium Citrate-Vitamin D (CALCIUM + D PO), Take 1,000 mg by mouth daily., Disp: , Rfl:    Cholecalciferol (VITAMIN D) 50 MCG (2000 UT) CAPS, Take by mouth., Disp: , Rfl:    clonazePAM (KLONOPIN) 0.5 MG tablet, Take 1 tablet (0.5 mg total) by mouth daily as needed for anxiety., Disp: 30 tablet, Rfl: 2   fluticasone (FLONASE) 50 MCG/ACT nasal spray, Place 2 sprays into both nostrils daily., Disp: 16 g, Rfl: 6   furosemide (LASIX) 20 MG tablet, Take 1 tablet (20 mg total) by mouth daily. (Patient taking differently: Take 20 mg by mouth every morning.), Disp: 90 tablet, Rfl: 4   gabapentin (NEURONTIN) 800 MG tablet, Take 1 tablet (800 mg total) by mouth in the morning and at bedtime., Disp: 180 tablet, Rfl: 4   hydrOXYzine (ATARAX) 10 MG tablet, Take 1 tablet (10 mg total) by mouth 3 (three) times daily as needed. (Patient taking differently: Take 10 mg by mouth 3 (three) times daily.), Disp: 270 tablet, Rfl: 4   lidocaine (LIDODERM) 5 %, Place 1 patch onto the skin daily. Remove & Discard patch within 12 hours or as directed by MD, Disp: 30 patch, Rfl: 2   Melatonin 10 MG TABS, Take 1 tablet by mouth at bedtime., Disp: , Rfl:    Multiple Vitamins-Minerals (MULTIVITAMIN WITH MINERALS) tablet, Take 1 tablet by mouth daily., Disp: , Rfl:    omeprazole (PRILOSEC) 40 MG capsule, Take 1 capsule (40 mg total) by mouth daily. (Patient taking differently: Take 40 mg by mouth every morning.), Disp: 90 capsule, Rfl: 4   potassium chloride SA (KLOR-CON M) 20 MEQ tablet, Take 1 tablet (20 mEq total) by mouth daily. (Patient taking differently: 20 mEq every morning.), Disp: 90 tablet, Rfl: 4   sodium chloride HYPERTONIC 3 % nebulizer solution, Take by nebulization in the morning and at bedtime., Disp: 750 mL, Rfl: 12   sotalol (BETAPACE) 80 MG tablet, Take 80 mg by mouth 2 (two) times daily., Disp: , Rfl:    Tiotropium Bromide-Olodaterol (STIOLTO RESPIMAT) 2.5-2.5  MCG/ACT AERS, Inhale 2 puffs into the lungs daily., Disp: 120 each, Rfl: 12   venlafaxine XR (EFFEXOR-XR) 150 MG 24 hr capsule, Take 1 capsule (150 mg total) by mouth daily with breakfast., Disp: 90 capsule, Rfl: 4   vitamin B-12 (CYANOCOBALAMIN) 1000 MCG tablet, Take 1,000 mcg by mouth daily., Disp: , Rfl:    Subjective:   PATIENT ID: [name] GENDER: female DOB: 12/24/1955, MRN: [id number]  Chief Complaint  Patient presents with   Follow-up    No cough, shortness of breath or wheezing.     HPI  67 year old female with a history of afib (on Eliquis) and presumed COPD (on Spiriva Respimat) who presents to clinic for follow up. I had first saw her on 04/23/2023 for the evaluation of findings noted on LDCT for lung cancer screening. She underwent bronchoscopy on 04/09/2023 with BAL.   Patient remains in her usual state of health. She has no  complaints today. Symptoms are improved overall. She continues to have an occasional cough but this is unchanged. No chest tightness or chest pain reported. No wheezing, hemoptysis, or increased shortness of breath. She does have some exertional dyspnea. Reports being limited with activity secondary to arthritis.   Patient was enrolled in lung cancer screening and was note to have a left lingular infiltrate with tree in bud nodularity that have increased over the past few scans. The two CT's from November of 2023 and February of 2024 showed a nodular and mass-like areas of consolidation in the lingula. On my review, there were tree-in-bud opacities in that area as well. She underwent flexible bronchoscopy on 04/09/2023 with BAL growing H. Influenza. I prescribed her a course of Cefpodoxime which she tolerated well.   Patient is a current smoker and has smoked for around 50 years, with 50 pack years of smoking history. She used to work in a [employer]. She lives at home with her son, and has lived in her house for over 20 years. She has a crawl space, no  basement, no mold, no hot tub, and no Jacuzzi. Water source is municipal. No report of previous infections.  Ancillary information including prior medications, full medical/surgical/family/social histories, and PFTs (when available) are listed below and have been reviewed.   Review of Systems  Constitutional:  Negative for chills, fever, malaise/fatigue and weight loss.  Respiratory:  Negative for cough, hemoptysis, sputum production, shortness of breath and wheezing.   Cardiovascular:  Negative for chest pain and palpitations.     Objective:   Vitals:   08/30/23 1344  BP: (!) 128/56  Pulse: 61  Temp: 97.6 F (36.4 C)  TempSrc: Temporal  SpO2: 96%  Weight: 216 lb 12.8 oz (98.3 kg)  Height: 5\' 3"  (1.6 m)   96% on RA  BMI Readings from Last 3 Encounters:  08/30/23 38.40 kg/m  08/17/23 37.38 kg/m  07/06/23 36.00 kg/m   Wt Readings from Last 3 Encounters:  08/30/23 216 lb 12.8 oz (98.3 kg)  08/17/23 211 lb (95.7 kg)  07/06/23 203 lb 3.2 oz (92.2 kg)    Physical Exam Constitutional:      General: She is not in acute distress.    Appearance: She is not ill-appearing.  HENT:     Head: Normocephalic.     Mouth/Throat:     Mouth: Mucous membranes are moist.  Cardiovascular:     Rate and Rhythm: Normal rate and regular rhythm.     Pulses: Normal pulses.     Heart sounds: Normal heart sounds.  Pulmonary:     Effort: Pulmonary effort is normal.     Breath sounds: No wheezing.  Abdominal:     Palpations: Abdomen is soft.  Neurological:     General: No focal deficit present.     Mental Status: She is alert and oriented to person, place, and time. Mental status is at baseline.       Ancillary Information    Past Medical History:  Diagnosis Date   Anxiety    Aortic atherosclerosis (HCC)    Arthritis    Atrial fibrillation with RVR (HCC)    a.) CHA2DS2VASc = 4 (age, sex, HTN, vascular disease history);  b.) rate/rhythm maintained on oral sotolol; chronically  anticoagulated with apixaban   Atypical chest pain    Benign essential tremor    Chronic kidney disease, stage 3a (HCC)    COPD (chronic obstructive pulmonary disease) (HCC)    DDD (degenerative disc  disease), cervical    a.) s/p ACDF C6-C7   DDD (degenerative disc disease), lumbar    Enlarged heart    Fatty liver    GERD (gastroesophageal reflux disease)    Hiatal hernia    Hyperlipidemia    Hypertension    Insomnia    a.) takes melatonin PRN   Long term current use of anticoagulant    a.) apixaban   Mass of right adrenal gland (HCC)    a.) CT AP 08/12/2022: measured 3.3 cm   Monocytosis    Morbid obesity (HCC)    Nicotine dependence    OSA on CPAP    Osteoporosis    Pancreatitis    Peripheral vascular disease (HCC)    Psoriasis    SOB (shortness of breath)    Umbilical hernia    Vitamin D deficiency      Family History  Problem Relation Age of Onset   COPD Father    Heart disease Father    Hyperlipidemia Father    Hypertension Father    Dementia Father    Cancer Sister    Hyperlipidemia Sister    Hypertension Sister    Lymphoma Sister    Migraines Son    Stroke Maternal Grandfather    Heart attack Brother    Cancer Brother        colorectal and liver   Breast cancer Paternal Aunt    Breast cancer Paternal Aunt      Past Surgical History:  Procedure Laterality Date   ABDOMINAL HYSTERECTOMY     ACHILLES TENDON REPAIR     Removed bone spur and repaired achilles tendon   ANTERIOR CERVICAL DECOMP/DISCECTOMY FUSION     APPENDECTOMY     COLONOSCOPY WITH PROPOFOL N/A 01/17/2019   Procedure: COLONOSCOPY WITH PROPOFOL;  Surgeon: Wyline Mood, MD;  Location: [facility] ENDOSCOPY;  Service: Gastroenterology;  Laterality: N/A;   FLEXIBLE BRONCHOSCOPY Bilateral 04/09/2023   Procedure: FLEXIBLE BRONCHOSCOPY;  Surgeon: Raechel Chute, MD;  Location: [facility] ORS;  Service: Pulmonary;  Laterality: Bilateral;   TONSILLECTOMY      Social History   Socioeconomic History    Marital status: Widowed    Spouse name: Not on file   Number of children: Not on file   Years of education: Not on file   Highest education level: GED or equivalent  Occupational History   Occupation: disabled  Tobacco Use   Smoking status: Every Day    Current packs/day: 1.00    Average packs/day: 1 pack/day for 48.0 years (48.0 ttl pk-yrs)    Types: Cigarettes    Passive exposure: Past   Smokeless tobacco: Never  Vaping Use   Vaping status: Never Used  Substance and Sexual Activity   Alcohol use: No    Alcohol/week: 0.0 standard drinks of alcohol   Drug use: No   Sexual activity: Never    Partners: Male  Other Topics Concern   Not on file  Social History Narrative   Not on file   Social Determinants of Health   Financial Resource Strain: Low Risk  (04/10/2023)   Overall Financial Resource Strain (CARDIA)    Difficulty of Paying Living Expenses: Not very hard  Food Insecurity: No Food Insecurity (04/10/2023)   Hunger Vital Sign    Worried About Running Out of Food in the Last Year: Never true    Ran Out of Food in the Last Year: Never true  Transportation Needs: No Transportation Needs (04/10/2023)   PRAPARE - Transportation  Lack of Transportation (Medical): No    Lack of Transportation (Non-Medical): No  Physical Activity: Inactive (04/10/2023)   Exercise Vital Sign    Days of Exercise per Week: 0 days    Minutes of Exercise per Session: 0 min  Stress: Stress Concern Present (04/10/2023)   [employer] of Occupational Health - Occupational Stress Questionnaire    Feeling of Stress : Rather much  Social Connections: Socially Isolated (04/10/2023)   Social Connection and Isolation Panel [NHANES]    Frequency of Communication with Friends and Family: More than three times a week    Frequency of Social Gatherings with Friends and Family: More than three times a week    Attends Religious Services: Never    Database administrator or Organizations: No    Attends Occupational hygienist Meetings: Never    Marital Status: Widowed  Intimate Partner Violence: Not At Risk (01/12/2023)   Humiliation, Afraid, Rape, and Kick questionnaire    Fear of Current or Ex-Partner: No    Emotionally Abused: No    Physically Abused: No    Sexually Abused: No     Allergies  Allergen Reactions   Iodinated Contrast Media Rash    Had rash present to neck with PET scan contrast     CBC    Component Value Date/Time   WBC 12.2 (H) 08/17/2023 1526   WBC 10.1 03/31/2023 0321   RBC 3.98 08/17/2023 1526   RBC 3.99 03/31/2023 0321   HGB 11.2 08/17/2023 1526   HCT 35.8 08/17/2023 1526   PLT 326 08/17/2023 1526   MCV 90 08/17/2023 1526   MCV 93 03/23/2014 1851   MCH 28.1 08/17/2023 1526   MCH 29.3 03/31/2023 0321   MCHC 31.3 (L) 08/17/2023 1526   MCHC 31.4 03/31/2023 0321   RDW 13.1 08/17/2023 1526   RDW 14.7 (H) 03/23/2014 1851   LYMPHSABS 4.5 (H) 08/17/2023 1526   MONOABS 1.0 03/31/2023 0321   EOSABS 0.2 08/17/2023 1526   BASOSABS 0.1 08/17/2023 1526    Pulmonary Functions Testing Results:    Latest Ref Rng & Units 08/24/2023    3:27 PM  PFT Results  FVC-Pre L 2.37   FVC-Predicted Pre % 79   FVC-Post L 2.31   FVC-Predicted Post % 77   Pre FEV1/FVC % % 72   Post FEV1/FCV % % 74   FEV1-Pre L 1.70   FEV1-Predicted Pre % 74   FEV1-Post L 1.70   DLCO uncorrected ml/min/mmHg 11.07   DLCO UNC% % 58   DLVA Predicted % 63   TLC L 7.90   TLC % Predicted % 161   RV % Predicted % 266     Outpatient Medications Prior to Visit  Medication Sig Dispense Refill   acetaminophen (TYLENOL) 500 MG tablet Take 1,000 mg by mouth in the morning and at bedtime.     albuterol (PROAIR HFA) 108 (90 Base) MCG/ACT inhaler Inhale 2 puffs into the lungs every 6 (six) hours as needed for wheezing or shortness of breath. 18 g 4   apixaban (ELIQUIS) 5 MG TABS tablet Take 1 tablet (5 mg total) by mouth 2 (two) times daily. 60 tablet 1   ascorbic acid (VITAMIN C) 500 MG tablet Take  500 mg by mouth daily.     atorvastatin (LIPITOR) 80 MG tablet Take 1 tablet (80 mg total) by mouth daily. (Patient taking differently: Take 80 mg by mouth at bedtime.) 90 tablet 4   baclofen (LIORESAL) 10 MG  tablet Take 0.5 tablets (5 mg total) by mouth 3 (three) times daily. 135 each 4   busPIRone (BUSPAR) 10 MG tablet TAKE 2 TABLETS BY MOUTH 3 TIMES DAILY. 540 tablet 1   Calcium Citrate-Vitamin D (CALCIUM + D PO) Take 1,000 mg by mouth daily.     Cholecalciferol (VITAMIN D) 50 MCG (2000 UT) CAPS Take by mouth.     clonazePAM (KLONOPIN) 0.5 MG tablet Take 1 tablet (0.5 mg total) by mouth daily as needed for anxiety. 30 tablet 2   fluticasone (FLONASE) 50 MCG/ACT nasal spray Place 2 sprays into both nostrils daily. 16 g 6   furosemide (LASIX) 20 MG tablet Take 1 tablet (20 mg total) by mouth daily. (Patient taking differently: Take 20 mg by mouth every morning.) 90 tablet 4   gabapentin (NEURONTIN) 800 MG tablet Take 1 tablet (800 mg total) by mouth in the morning and at bedtime. 180 tablet 4   hydrOXYzine (ATARAX) 10 MG tablet Take 1 tablet (10 mg total) by mouth 3 (three) times daily as needed. (Patient taking differently: Take 10 mg by mouth 3 (three) times daily.) 270 tablet 4   lidocaine (LIDODERM) 5 % Place 1 patch onto the skin daily. Remove & Discard patch within 12 hours or as directed by MD 30 patch 2   Melatonin 10 MG TABS Take 1 tablet by mouth at bedtime.     Multiple Vitamins-Minerals (MULTIVITAMIN WITH MINERALS) tablet Take 1 tablet by mouth daily.     omeprazole (PRILOSEC) 40 MG capsule Take 1 capsule (40 mg total) by mouth daily. (Patient taking differently: Take 40 mg by mouth every morning.) 90 capsule 4   potassium chloride SA (KLOR-CON M) 20 MEQ tablet Take 1 tablet (20 mEq total) by mouth daily. (Patient taking differently: 20 mEq every morning.) 90 tablet 4   sotalol (BETAPACE) 80 MG tablet Take 80 mg by mouth 2 (two) times daily.     venlafaxine XR (EFFEXOR-XR) 150 MG 24 hr  capsule Take 1 capsule (150 mg total) by mouth daily with breakfast. 90 capsule 4   vitamin B-12 (CYANOCOBALAMIN) 1000 MCG tablet Take 1,000 mcg by mouth daily.     Tiotropium Bromide Monohydrate (SPIRIVA RESPIMAT) 2.5 MCG/ACT AERS INHALE 2 SPRAY(S) BY MOUTH ONCE DAILY 4 g 2   No facility-administered medications prior to visit.

## 2023-09-01 ENCOUNTER — Telehealth: Payer: Self-pay | Admitting: Student in an Organized Health Care Education/Training Program

## 2023-09-01 NOTE — Telephone Encounter (Signed)
Patient needs a nebulizer, a flutter device and spirometer. She had an appointment on 08/30/23 and was wondering where she needs to get the items from.

## 2023-09-01 NOTE — Telephone Encounter (Signed)
Per the referral from 9:30- the referral was sent to [employer].  I have notified the patient. She will call back if she has not heard from [employer] in a week.  Nothing further needed.

## 2023-09-16 ENCOUNTER — Telehealth: Payer: Self-pay | Admitting: Student in an Organized Health Care Education/Training Program

## 2023-09-16 DIAGNOSIS — J439 Emphysema, unspecified: Secondary | ICD-10-CM | POA: Diagnosis not present

## 2023-09-16 NOTE — Telephone Encounter (Signed)
Pt looking for supplies nebulizer flutter valve and spirometer

## 2023-09-16 NOTE — Telephone Encounter (Signed)
Patient called into office again today still hasn't received any of the items ordered for her. I sent urgent message to [employer] to check on this issue

## 2023-09-17 NOTE — Telephone Encounter (Signed)
I received a message from [place] with [employer] [employer]   order is complete...reached out to patient and they are picking everything up a [employer] retail store

## 2023-09-17 NOTE — Telephone Encounter (Signed)
Patient called into office again today still hasn't received any of the items ordered for her. I sent urgent message to [employer] to check on this issue

## 2023-09-20 ENCOUNTER — Other Ambulatory Visit: Payer: Self-pay | Admitting: Nurse Practitioner

## 2023-09-21 NOTE — Telephone Encounter (Signed)
Requested by interface surescripts. Future visit in 1 month.  Requested Prescriptions  Pending Prescriptions Disp Refills   KLOR-CON M20 20 MEQ tablet [Pharmacy Med Name: KLOR-CON M20 TABLET] 90 tablet 4    Sig: TAKE 1 TABLET BY MOUTH EVERY DAY     Endocrinology:  Minerals - Potassium Supplementation Passed - 09/20/2023  1:18 AM      Passed - K in normal range and within 360 days    Potassium  Date Value Ref Range Status  08/17/2023 4.8 3.5 - 5.2 mmol/L Final  03/23/2014 3.6 3.5 - 5.1 mmol/L Final         Passed - Cr in normal range and within 360 days    Creatinine  Date Value Ref Range Status  05/17/2023 20.8 20.0 - 300.0 mg/dL Final  16/08/9603 5.40 0.60 - 1.30 mg/dL Final   Creatinine, Ser  Date Value Ref Range Status  08/17/2023 0.92 0.57 - 1.00 mg/dL Final         Passed - Valid encounter within last 12 months    Recent Outpatient Visits           1 month ago Centrilobular emphysema (HCC)   [place] [facility] [place], [place] T, NP   4 months ago Depression, major, single episode, in partial remission (HCC)   [place] [facility] [place], Corrie Dandy T, NP   5 months ago PVD (peripheral vascular disease) (HCC)   [place] [facility] [place], Corrie Dandy T, NP   6 months ago Chronic kidney disease, stage 3a (HCC)   [place] [facility] [place], Jolene T, NP   9 months ago Cellulitis of right lower leg   [place] [facility] [place], Dorie Rank, NP       Future Appointments             In 1 month [place], Dorie Rank, NP  [facility]

## 2023-10-17 DIAGNOSIS — J439 Emphysema, unspecified: Secondary | ICD-10-CM | POA: Diagnosis not present

## 2023-11-13 NOTE — Patient Instructions (Signed)
Be Involved in Caring For Your Health:  Taking Medications When medications are taken as directed, they can greatly improve your health. But if they are not taken as prescribed, they may not work. In some cases, not taking them correctly can be harmful. To help ensure your treatment remains effective and safe, understand your medications and how to take them. Bring your medications to each visit for review by your provider.  Your lab results, notes, and after visit summary will be available on My Chart. We strongly encourage you to use this feature. If lab results are abnormal the clinic will contact you with the appropriate steps. If the clinic does not contact you assume the results are satisfactory. You can always view your results on My Chart. If you have questions regarding your health or results, please contact the clinic during office hours. You can also ask questions on My Chart.  We at [facility] are grateful that you chose [place] to provide your care. We strive to provide evidence-based and compassionate care and are always looking for feedback. If you get a survey from the clinic please complete this so we can hear your opinions.  Healthy Eating, Adult Healthy eating may help you get and keep a healthy body weight, reduce the risk of chronic disease, and live a long and productive life. It is important to follow a healthy eating pattern. Your nutritional and calorie needs should be met mainly by different nutrient-rich foods. What are tips for following this plan? Reading food labels Read labels and choose the following: Reduced or low sodium products. Juices with 100% fruit juice. Foods with low saturated fats (<3 g per serving) and high polyunsaturated and monounsaturated fats. Foods with whole grains, such as whole wheat, cracked wheat, brown rice, and wild rice. Whole grains that are fortified with folic acid. This is recommended for females who are pregnant or who want to  become pregnant. Read labels and do not eat or drink the following: Foods or drinks with added sugars. These include foods that contain brown sugar, corn sweetener, corn syrup, dextrose, fructose, glucose, high-fructose corn syrup, honey, invert sugar, lactose, malt syrup, maltose, molasses, raw sugar, sucrose, trehalose, or turbinado sugar. Limit your intake of added sugars to less than 10% of your total daily calories. Do not eat more than the following amounts of added sugar per day: 6 teaspoons (25 g) for females. 9 teaspoons (38 g) for males. Foods that contain processed or refined starches and grains. Refined grain products, such as white flour, degermed cornmeal, white bread, and white rice. Shopping Choose nutrient-rich snacks, such as vegetables, whole fruits, and nuts. Avoid high-calorie and high-sugar snacks, such as potato chips, fruit snacks, and candy. Use oil-based dressings and spreads on foods instead of solid fats such as butter, margarine, sour cream, or cream cheese. Limit pre-made sauces, mixes, and "instant" products such as flavored rice, instant noodles, and ready-made pasta. Try more plant-protein sources, such as tofu, tempeh, black beans, edamame, lentils, nuts, and seeds. Explore eating plans such as the Mediterranean diet or vegetarian diet. Try heart-healthy dips made with beans and healthy fats like hummus and guacamole. Vegetables go great with these. Cooking Use oil to saut or stir-fry foods instead of solid fats such as butter, margarine, or lard. Try baking, boiling, grilling, or broiling instead of frying. Remove the fatty part of meats before cooking. Steam vegetables in water or broth. Meal planning  At meals, imagine dividing your plate into fourths: One-half of  your plate is fruits and vegetables. One-fourth of your plate is whole grains. One-fourth of your plate is protein, especially lean meats, poultry, eggs, tofu, beans, or nuts. Include low-fat  dairy as part of your daily diet. Lifestyle Choose healthy options in all settings, including home, work, school, restaurants, or stores. Prepare your food safely: Wash your hands after handling raw meats. Where you prepare food, keep surfaces clean by regularly washing with hot, soapy water. Keep raw meats separate from ready-to-eat foods, such as fruits and vegetables. Cook seafood, meat, poultry, and eggs to the recommended temperature. Get a food thermometer. Store foods at safe temperatures. In general: Keep cold foods at 48F (4.4C) or below. Keep hot foods at 148F (60C) or above. Keep your freezer at [facility] (-17.8C) or below. Foods are not safe to eat if they have been between the temperatures of 40-148F (4.4-60C) for more than 2 hours. What foods should I eat? Fruits Aim to eat 1-2 cups of fresh, canned (in natural juice), or frozen fruits each day. One cup of fruit equals 1 small apple, 1 large banana, 8 large strawberries, 1 cup (237 g) canned fruit,  cup (82 g) dried fruit, or 1 cup (240 mL) 100% juice. Vegetables Aim to eat 2-4 cups of fresh and frozen vegetables each day, including different varieties and colors. One cup of vegetables equals 1 cup (91 g) broccoli or cauliflower florets, 2 medium carrots, 2 cups (150 g) raw, leafy greens, 1 large tomato, 1 large bell pepper, 1 large sweet potato, or 1 medium white potato. Grains Aim to eat 5-10 ounce-equivalents of whole grains each day. Examples of 1 ounce-equivalent of grains include 1 slice of bread, 1 cup (40 g) ready-to-eat cereal, 3 cups (24 g) popcorn, or  cup (93 g) cooked rice. Meats and other proteins Try to eat 5-7 ounce-equivalents of protein each day. Examples of 1 ounce-equivalent of protein include 1 egg,  oz nuts (12 almonds, 24 pistachios, or 7 walnut halves), 1/4 cup (90 g) cooked beans, 6 tablespoons (90 g) hummus or 1 tablespoon (16 g) peanut butter. A cut of meat or fish that is the size of a deck of  cards is about 3-4 ounce-equivalents (85 g). Of the protein you eat each week, try to have at least 8 sounce (227 g) of seafood. This is about 2 servings per week. This includes salmon, trout, herring, sardines, and anchovies. Dairy Aim to eat 3 cup-equivalents of fat-free or low-fat dairy each day. Examples of 1 cup-equivalent of dairy include 1 cup (240 mL) milk, 8 ounces (250 g) yogurt, 1 ounces (44 g) natural cheese, or 1 cup (240 mL) fortified soy milk. Fats and oils Aim for about 5 teaspoons (21 g) of fats and oils per day. Choose monounsaturated fats, such as canola and olive oils, mayonnaise made with olive oil or avocado oil, avocados, peanut butter, and most nuts, or polyunsaturated fats, such as sunflower, corn, and soybean oils, walnuts, pine nuts, sesame seeds, sunflower seeds, and flaxseed. Beverages Aim for 6 eight-ounce glasses of water per day. Limit coffee to 3-5 eight-ounce cups per day. Limit caffeinated beverages that have added calories, such as soda and energy drinks. If you drink alcohol: Limit how much you have to: 0-1 drink a day if you are female. 0-2 drinks a day if you are female. Know how much alcohol is in your drink. In the [place] one drink is one 12 oz bottle of beer (355 mL), one 5 oz glass of wine (  148 mL), or one 1 oz glass of hard liquor (44 mL). Seasoning and other foods Try not to add too much salt to your food. Try using herbs and spices instead of salt. Try not to add sugar to food. This information is based on [place] nutrition guidelines. To learn more, visit DisposableN[employer].be. Exact amounts may vary. You may need different amounts. This information is not intended to replace advice given to you by your health care provider. Make sure you discuss any questions you have with your health care provider. Document Revised: 08/17/2022 Document Reviewed: 08/17/2022 Elsevier Patient Education  2024 [employer].

## 2023-11-16 DIAGNOSIS — J439 Emphysema, unspecified: Secondary | ICD-10-CM | POA: Diagnosis not present

## 2023-11-17 ENCOUNTER — Encounter: Payer: Self-pay | Admitting: Nurse Practitioner

## 2023-11-17 ENCOUNTER — Ambulatory Visit (INDEPENDENT_AMBULATORY_CARE_PROVIDER_SITE_OTHER): Payer: PPO | Admitting: Nurse Practitioner

## 2023-11-17 VITALS — BP 130/66 | HR 62 | Temp 98.3°F | Resp 16 | Ht 63.0 in | Wt 216.0 lb

## 2023-11-17 DIAGNOSIS — D72821 Monocytosis (symptomatic): Secondary | ICD-10-CM | POA: Diagnosis not present

## 2023-11-17 DIAGNOSIS — F419 Anxiety disorder, unspecified: Secondary | ICD-10-CM | POA: Diagnosis not present

## 2023-11-17 DIAGNOSIS — R7303 Prediabetes: Secondary | ICD-10-CM

## 2023-11-17 DIAGNOSIS — G8929 Other chronic pain: Secondary | ICD-10-CM | POA: Insufficient documentation

## 2023-11-17 DIAGNOSIS — I10 Essential (primary) hypertension: Secondary | ICD-10-CM

## 2023-11-17 DIAGNOSIS — I4891 Unspecified atrial fibrillation: Secondary | ICD-10-CM

## 2023-11-17 DIAGNOSIS — N1831 Chronic kidney disease, stage 3a: Secondary | ICD-10-CM | POA: Diagnosis not present

## 2023-11-17 DIAGNOSIS — J432 Centrilobular emphysema: Secondary | ICD-10-CM

## 2023-11-17 DIAGNOSIS — F324 Major depressive disorder, single episode, in partial remission: Secondary | ICD-10-CM | POA: Diagnosis not present

## 2023-11-17 DIAGNOSIS — E782 Mixed hyperlipidemia: Secondary | ICD-10-CM

## 2023-11-17 DIAGNOSIS — I739 Peripheral vascular disease, unspecified: Secondary | ICD-10-CM

## 2023-11-17 DIAGNOSIS — M25512 Pain in left shoulder: Secondary | ICD-10-CM

## 2023-11-17 DIAGNOSIS — F17210 Nicotine dependence, cigarettes, uncomplicated: Secondary | ICD-10-CM

## 2023-11-17 DIAGNOSIS — M5416 Radiculopathy, lumbar region: Secondary | ICD-10-CM

## 2023-11-17 DIAGNOSIS — G894 Chronic pain syndrome: Secondary | ICD-10-CM

## 2023-11-17 LAB — BAYER DCA HB A1C WAIVED: HB A1C (BAYER DCA - WAIVED): 5.9 % — ABNORMAL HIGH (ref 4.8–5.6)

## 2023-11-17 MED ORDER — BACLOFEN 10 MG PO TABS: 10.0000 mg | ORAL_TABLET | Freq: Three times a day (TID) | ORAL | 4 refills | Status: DC

## 2023-11-17 MED ORDER — PREDNISONE 10 MG (21) PO TBPK: ORAL_TABLET | ORAL | 0 refills | Status: DC

## 2023-11-17 NOTE — Assessment & Plan Note (Signed)
Chronic, ongoing.  Continue statin and adjust as needed -- Atorvastatin 80 MG.  Lipid panel today. 

## 2023-11-17 NOTE — Assessment & Plan Note (Signed)
BMI 38.26 with HTN and COPD, A-Fib.  Recommended eating smaller high protein, low fat meals more frequently and exercising 30 mins a day 5 times a week with a goal of 10-15lb weight loss in the next 3 months. Patient voiced their understanding and motivation to adhere to these recommendations.  Tolerated Ozempic with total 68 pounds loss, but insurance no longer covering.  Will try to get Wegovy covered in new year due to cardiac indications.  Educated her on this.  No history of thyroid cancer in patient or her family.  Currently she has gained weight while off medication.

## 2023-11-17 NOTE — Progress Notes (Signed)
BP 130/66 (BP Location: Left Arm, Patient Position: Sitting, Cuff Size: Large)   Pulse 62   Temp 98.3 F (36.8 C) (Oral)   Resp 16   Ht 5\' 3"  (1.6 m)   Wt 216 lb (98 kg)   LMP  (LMP Unknown)   SpO2 98%   BMI 38.26 kg/m    Subjective:    Patient ID: [name], female    DOB: Dec 30, 1955, 67 y.o.   MRN: [id number]  HPI: [name] is a 67 y.o. female  Chief Complaint  Patient presents with   Anxiety   Depression   Shoulder Pain    Patient says she has a pain radiating down from her shoulders down into her entire body. Patient says she has taken Tylenol earlier and it doesn't help. Patient says she has been having issues with her back to where she couldn't lay down without being in extreme pain. Patient says she has been having the issue with her shoulder for a while, gradually getting worse.    ATRIAL FIBRILLATION Continues Eliquis, Sotalol, and Lasix + Atorvastatin 80 MG daily for cholesterol. Last visit with cardiology was 07/05/23.  Is off Ozempic as insurance not covering (has been off for 9 months). Had been on for one year and drastic weight loss and improvement in quality of life with this on board.  Started 12/10/21 at 256 lbs and then went down to 188 lbs = total 68 pounds loss. She had met her goal. Now she is gaining back without it on board. Atrial fibrillation status: stable Satisfied with current treatment: yes  Medication side effects:  no Medication compliance: good compliance Etiology of atrial fibrillation: unknown Palpitations:  no Chest pain:  no Dyspnea on exertion: no Orthopnea:  no Syncope:  no Edema: no Ventricular rate control: Sotalol Anti-coagulation: long acting   Impaired Fasting Glucose HbA1C:  Lab Results  Component Value Date   HGBA1C 5.9 (H) 11/17/2023  Duration of elevated blood sugar:  Polydipsia: no Polyuria: no Weight change: no Visual disturbance: no Glucose Monitoring: no    Accucheck frequency: Not Checking     Fasting glucose:     Post prandial:  Diabetic Education: Not Completed Family history of diabetes: yes   COPD Continues to smoke 1 PPD.  Annual lung screening, last check on 06/07/23 which noted ongoing aortic atherosclerosis and centrilobular emphysema.  Follows with pulmonary who recently changed her to [facility] and she reports they told her she has mild to moderate COPD + he has her using Saline and Albuterol. COPD status: stable Satisfied with current treatment?: yes Oxygen use: no Dyspnea frequency: none Cough frequency: none Rescue inhaler frequency: none Limitation of activity: no Productive cough: none Last Spirometry: unknown Pneumovax: Up to Date Influenza: Up to Date  BACK PAIN Shoulder pain (left) started 3 months ago and back started yesterday with acute flare.  Has chronic pain at baseline.  Shoulder is getting worse and she can barely raise up.   Duration: months Mechanism of injury: unknown Location: bilateral and low back Onset: gradual Severity: 10/10 Quality: sharp, dull, aching, and throbbing Frequency: constant Radiation: R leg below the knee Aggravating factors: lifting, movement, walking, and bending Alleviating factors: nothing Status: fluctuating Treatments attempted: rest, heat, and APAP  Relief with NSAIDs?: No NSAIDs Taken Nighttime pain:  yes Paresthesias / decreased sensation:  no Bowel / bladder incontinence:  no Fevers:  no Dysuria / urinary frequency:  no   DEPRESSION Continues Effexor XR 150 MG  daily which has offered benefit, took Duloxetine.  Continues on Buspar 20 MG TID + Clonazepam 0.5 MG daily PRN -- she does use daily.  Is not seeing pain clinic any longer due to taking benzo.  Pt is aware of risks of psychoactive medication use to include increased sedation, respiratory suppression, falls, dependence and cardiovascular events.  Pt would like to continue treatment as benefit determined to outweigh risk.    Mood status: stable Satisfied  with current treatment?: yes Symptom severity: moderate  Duration of current treatment : chronic Side effects: no Medication compliance: good compliance Psychotherapy/counseling: none Depressed mood: no Anxious mood: at night time Anhedonia: no Significant weight loss or gain: no Insomnia: yes, falling asleep, gets panicked -- uses CPAP 100% of the time Fatigue: no Feelings of worthlessness or guilt: no Impaired concentration/indecisiveness: no Suicidal ideations: no Hopelessness: no Crying spells: no    11/17/2023    3:08 PM 08/17/2023    2:47 PM 05/17/2023    2:48 PM 04/14/2023    3:47 PM 03/17/2023    3:31 PM  Depression screen PHQ 2/9  Decreased Interest 0 0 2 0 0  Down, Depressed, Hopeless 0 0 0 0 0  PHQ - 2 Score 0 0 2 0 0  Altered sleeping 0 0 2 2 0  Tired, decreased energy 0 0 0 0 0  Change in appetite 0 0 0 0 0  Feeling bad or failure about yourself  0 0 0 0 0  Trouble concentrating 0 0 0 0 0  Moving slowly or fidgety/restless 0 0 0 0 0  Suicidal thoughts 0 0 0 0 0  PHQ-9 Score 0 0 4 2 0  Difficult doing work/chores Not difficult at all Not difficult at all   Not difficult at all       11/17/2023    3:08 PM 08/17/2023    2:47 PM 05/17/2023    2:48 PM 04/14/2023    3:47 PM  GAD 7 : Generalized Anxiety Score  Nervous, Anxious, on Edge 0 3 3 2   Control/stop worrying 0 0 0 0  Worry too much - different things 0 0 0 0  Trouble relaxing 0 1 3 2   Restless 0 0 0 2  Easily annoyed or irritable 0 0 0 0  Afraid - awful might happen 0 0 0 0  Total GAD 7 Score 0 4 6 6   Anxiety Difficulty Not difficult at all Not difficult at all  Not difficult at all   Relevant past medical, surgical, family and social history reviewed and updated as indicated. Interim medical history since our last visit reviewed. Allergies and medications reviewed and updated.  Review of Systems  Constitutional:  Negative for activity change, appetite change, diaphoresis, fatigue and fever.  HENT:   Negative for ear discharge and ear pain.   Respiratory:  Negative for cough, chest tightness, shortness of breath and wheezing.   Cardiovascular:  Negative for chest pain, palpitations and leg swelling.  Gastrointestinal: Negative.   Musculoskeletal:  Positive for arthralgias and back pain.  Neurological: Negative.   Psychiatric/Behavioral:  Negative for decreased concentration, self-injury, sleep disturbance and suicidal ideas. The patient is not nervous/anxious.    Per HPI unless specifically indicated above     Objective:    BP 130/66 (BP Location: Left Arm, Patient Position: Sitting, Cuff Size: Large)   Pulse 62   Temp 98.3 F (36.8 C) (Oral)   Resp 16   Ht 5\' 3"  (1.6 m)   Wt  216 lb (98 kg)   LMP  (LMP Unknown)   SpO2 98%   BMI 38.26 kg/m   Wt Readings from Last 3 Encounters:  11/17/23 216 lb (98 kg)  08/30/23 216 lb 12.8 oz (98.3 kg)  08/17/23 211 lb (95.7 kg)    Physical Exam Vitals and nursing note reviewed.  Constitutional:      General: She is awake. She is not in acute distress.    Appearance: She is well-developed. She is obese. She is not ill-appearing.  HENT:     Head: Normocephalic.     Right Ear: Hearing, tympanic membrane, ear canal and external ear normal.     Left Ear: Hearing, tympanic membrane, ear canal and external ear normal.  Eyes:     General: Lids are normal.        Right eye: No discharge.        Left eye: No discharge.     Conjunctiva/sclera: Conjunctivae normal.     Pupils: Pupils are equal, round, and reactive to light.  Neck:     Vascular: No carotid bruit.  Cardiovascular:     Rate and Rhythm: Normal rate and regular rhythm.     Heart sounds: Normal heart sounds. No murmur heard.    No gallop.  Pulmonary:     Effort: Pulmonary effort is normal. No accessory muscle usage or respiratory distress.     Breath sounds: Decreased breath sounds and wheezing present.     Comments: Diminished throughout per baseline with occasional expiratory  wheezes. Abdominal:     General: Bowel sounds are normal.     Palpations: Abdomen is soft.  Musculoskeletal:     Right shoulder: Normal.     Left shoulder: Tenderness and crepitus present. No swelling or bony tenderness. Decreased range of motion (able to raise arm 50 degrees, overall reduced ROM with all mmovements). Decreased strength. Normal pulse.     Cervical back: Normal range of motion and neck supple.     Lumbar back: Spasms and tenderness present. No swelling or bony tenderness. Decreased range of motion. Negative right straight leg raise test and negative left straight leg raise test.     Right lower leg: Edema (trace) present.     Left lower leg: Edema (trace) present.  Skin:    General: Skin is warm and dry.  Neurological:     Mental Status: She is alert and oriented to person, place, and time.  Psychiatric:        Attention and Perception: Attention normal.        Mood and Affect: Mood normal.        Speech: Speech normal.        Behavior: Behavior normal. Behavior is cooperative.        Thought Content: Thought content normal.    Results for orders placed or performed in visit on 11/17/23  Bayer DCA Hb A1c Waived   Collection Time: 11/17/23  3:11 PM  Result Value Ref Range   HB A1C (BAYER DCA - WAIVED) 5.9 (H) 4.8 - 5.6 %      Assessment & Plan:   Problem List Items Addressed This Visit       Cardiovascular and Mediastinum   Atrial fibrillation with RVR (HCC)   Chronic, stable.  Diagnosed 04/14/22 in hospital.  At this time HR stable and no symptoms.  Continue collaboration with cardiology and current medication regimen, adjust as needed.        Relevant Orders   Comprehensive  metabolic panel   Lipid Panel w/o Chol/HDL Ratio   CBC with Differential/Platelet   Benign hypertension   Chronic, stable.  BP well below goal on check.  Continue current medication regimen and adjust as needed -- is taking BB and Lasix only at this time.  Previously took Telmisartan  and HCTZ.  Recommend she monitor BP at home daily and document for provider + focus on DASH diet.  LABS: CMP and CBC.  Urine ALB 29 December 2022, may benefit from return to low dose ARB in future.        PVD (peripheral vascular disease) (HCC)   Chronic, evidenced by decreased hair pattern and baseline edema.  Is long time smoker, recommend complete cessation.  Consider vascular visit in future.  At this time monitor skin closely for wounds and wear compression hose daily.        Respiratory   Centrilobular emphysema (HCC)   Chronic, ongoing.  Continue current medication regimen and adjust as needed.  Not O2 dependent at this time.  Continue to recommend complete smoking cessation. Continue collaboration with pulmonary, appreciate their input.  Recent note reviewed.      Relevant Medications   predniSONE (STERAPRED UNI-PAK 21 TAB) 10 MG (21) TBPK tablet     Nervous and Auditory   Radiculopathy, lumbar region   Chronic, with acute flare.  No red flags on exam.  Will start Prednisone taper, which has offered benefit in past.  Increase Baclofen to 10 MG TID.  Continue Tylenol and alternating ice and heat.  Ensure to perform gentle stretching at home.  Saw pain clinic in past, but preferred to continue anxiety medication and not pain medication -- we had discussed that both were not advised. If ongoing back pain consider PT.      Relevant Medications   baclofen (LIORESAL) 10 MG tablet     Genitourinary   Chronic kidney disease, stage 3a (HCC) - Primary   Ongoing.  GFR in the 50's, levels have been stable for 11 months at this time.  Recheck today.  Reminded to avoid NSAIDs.  May need to adjust Gabapentin based on labs, will review and determine need to reduce -- discussed with patient.  Labs today: CMP and CBC.  Increase fluid intake at home.      Relevant Orders   Comprehensive metabolic panel     Other   Prediabetes (Chronic)   Ongoing with A1c ranging from 5.7 to 5.9%, today is 5.9%  without Ozempic on board.  Insurance no longer covering Ozempic which offered her a lot of benefit.  Will try to get [facility] coverage in new year with her cardiac health and new indications.  Educated her on this.  No history of thyroid cancer in patient or her family.        Relevant Orders   Bayer DCA Hb A1c Waived (Completed)   Anxiety   Refer to depression plan of care.      Chronic left shoulder pain   Chronic and ongoing for months with worsening.  Significant decrease in ROM on exam.  Has tried simple treatment at home with no benefit.  Will place referral to ortho for further assessment.  Continue current home regimen at this time.      Relevant Medications   baclofen (LIORESAL) 10 MG tablet   predniSONE (STERAPRED UNI-PAK 21 TAB) 10 MG (21) TBPK tablet   Other Relevant Orders   Ambulatory referral to Orthopedic Surgery   Depression, major, single episode, in  partial remission (HCC)   Chronic, stable at this time. Denies SI/HI.  Continue Effexor XL 150 MG daily + Buspar and Vistaril.  Continue at lowest dosing, Klonopin 0.5 MG daily as needed only -- take only as needed.  Aware of need for every 3 month visit, yearly drug screen, and controlled substance agreement.  Controlled substance agreement up to date and UDS due 05/16/24.  Consider Prozac, as worked well in past, but this could interact with cardiac medications (QT prolongation) - will not initiate this at this time.  Consider psychiatry referral in future.  Monitor BP with Effexor.      Hyperlipidemia   Chronic, ongoing.  Continue statin and adjust as needed -- Atorvastatin 80 MG.  Lipid panel today.      Relevant Orders   Comprehensive metabolic panel   Lipid Panel w/o Chol/HDL Ratio   Morbid obesity (HCC)   BMI 38.26 with HTN and COPD, A-Fib.  Recommended eating smaller high protein, low fat meals more frequently and exercising 30 mins a day 5 times a week with a goal of 10-15lb weight loss in the next 3 months. Patient  voiced their understanding and motivation to adhere to these recommendations.  Tolerated Ozempic with total 68 pounds loss, but insurance no longer covering.  Will try to get Wegovy covered in new year due to cardiac indications.  Educated her on this.  No history of thyroid cancer in patient or her family.  Currently she has gained weight while off medication.       Nicotine dependence, cigarettes, uncomplicated   I have recommended complete cessation of tobacco use. I have discussed various options available for assistance with tobacco cessation including over the counter methods (Nicotine gum, patch and lozenges). We also discussed prescription options (Chantix, Nicotine Inhaler / Nasal Spray). The patient is not interested in pursuing any prescription tobacco cessation options at this time.  Continue annual lung screening.         Follow up plan: Return in about 3 months (around 02/15/2024) for HTN/HLD, Back Pain, ANXIETY.

## 2023-11-17 NOTE — Assessment & Plan Note (Signed)
Ongoing with A1c ranging from 5.7 to 5.9%, today is 5.9% without Ozempic on board.  Insurance no longer covering Ozempic which offered her a lot of benefit.  Will try to get [facility] coverage in new year with her cardiac health and new indications.  Educated her on this.  No history of thyroid cancer in patient or her family.

## 2023-11-17 NOTE — Assessment & Plan Note (Signed)
Chronic, ongoing.  Continue current medication regimen and adjust as needed.  Not O2 dependent at this time.  Continue to recommend complete smoking cessation. Continue collaboration with pulmonary, appreciate their input.  Recent note reviewed.

## 2023-11-17 NOTE — Assessment & Plan Note (Signed)
I have recommended complete cessation of tobacco use. I have discussed various options available for assistance with tobacco cessation including over the counter methods (Nicotine gum, patch and lozenges). We also discussed prescription options (Chantix, Nicotine Inhaler / Nasal Spray). The patient is not interested in pursuing any prescription tobacco cessation options at this time.  Continue annual lung screening. 

## 2023-11-17 NOTE — Assessment & Plan Note (Signed)
Chronic, stable.  BP well below goal on check.  Continue current medication regimen and adjust as needed -- is taking BB and Lasix only at this time.  Previously took Telmisartan and HCTZ.  Recommend [name] monitor BP at home daily and document for provider + focus on DASH diet.  LABS: CMP and CBC.  Urine ALB 29 December 2022, may benefit from return to low dose ARB in future.

## 2023-11-17 NOTE — Assessment & Plan Note (Signed)
Chronic, evidenced by decreased hair pattern and baseline edema.  Is long time smoker, recommend complete cessation.  Consider vascular visit in future.  At this time monitor skin closely for wounds and wear compression hose daily.

## 2023-11-17 NOTE — Assessment & Plan Note (Deleted)
Chronic, with acute flare.  No red flags on exam.  Will start Prednisone taper, which has offered benefit in past.  Increase Baclofen to 10 MG TID.  Continue Tylenol and alternating ice and heat.  Ensure to perform gentle stretching at home.  Saw pain clinic in past, but preferred to continue anxiety medication and not pain medication -- we had discussed that both were not advised. If ongoing back pain consider PT.

## 2023-11-17 NOTE — Assessment & Plan Note (Signed)
Chronic, stable at this time. Denies SI/HI.  Continue Effexor XL 150 MG daily + Buspar and Vistaril.  Continue at lowest dosing, Klonopin 0.5 MG daily as needed only -- take only as needed.  Aware of need for every 3 month visit, yearly drug screen, and controlled substance agreement.  Controlled substance agreement up to date and UDS due 05/16/24.  Consider Prozac, as worked well in past, but this could interact with cardiac medications (QT prolongation) - will not initiate this at this time.  Consider psychiatry referral in future.  Monitor BP with Effexor.

## 2023-11-17 NOTE — Assessment & Plan Note (Signed)
Ongoing.  GFR in the 50's, levels have been stable for 11 months at this time.  Recheck today.  Reminded to avoid NSAIDs.  May need to adjust Gabapentin based on labs, will review and determine need to reduce -- discussed with patient.  Labs today: CMP and CBC.  Increase fluid intake at home.

## 2023-11-17 NOTE — Assessment & Plan Note (Signed)
Chronic and ongoing for months with worsening.  Significant decrease in ROM on exam.  Has tried simple treatment at home with no benefit.  Will place referral to ortho for further assessment.  Continue current home regimen at this time.

## 2023-11-17 NOTE — Assessment & Plan Note (Signed)
Chronic, stable.  Diagnosed 04/14/22 in hospital.  At this time HR stable and no symptoms.  Continue collaboration with cardiology and current medication regimen, adjust as needed.

## 2023-11-17 NOTE — Assessment & Plan Note (Signed)
Chronic, with acute flare.  No red flags on exam.  Will start Prednisone taper, which has offered benefit in past.  Increase Baclofen to 10 MG TID.  Continue Tylenol and alternating ice and heat.  Ensure to perform gentle stretching at home.  Saw pain clinic in past, but preferred to continue anxiety medication and not pain medication -- we had discussed that both were not advised. If ongoing back pain consider PT.

## 2023-11-17 NOTE — Assessment & Plan Note (Signed)
Refer to depression plan of care. 

## 2023-11-18 ENCOUNTER — Other Ambulatory Visit: Payer: Self-pay | Admitting: Nurse Practitioner

## 2023-11-18 DIAGNOSIS — R748 Abnormal levels of other serum enzymes: Secondary | ICD-10-CM

## 2023-11-18 DIAGNOSIS — D649 Anemia, unspecified: Secondary | ICD-10-CM

## 2023-11-18 LAB — CBC WITH DIFFERENTIAL/PLATELET
Basophils Absolute: 0.1 10*3/uL (ref 0.0–0.2)
Basos: 1 %
EOS (ABSOLUTE): 0.2 10*3/uL (ref 0.0–0.4)
Eos: 2 %
Hematocrit: 33.7 % — ABNORMAL LOW (ref 34.0–46.6)
Hemoglobin: 10.5 g/dL — ABNORMAL LOW (ref 11.1–15.9)
Immature Grans (Abs): 0 10*3/uL (ref 0.0–0.1)
Immature Granulocytes: 0 %
Lymphocytes Absolute: 3.4 10*3/uL — ABNORMAL HIGH (ref 0.7–3.1)
Lymphs: 33 %
MCH: 27.2 pg (ref 26.6–33.0)
MCHC: 31.2 g/dL — ABNORMAL LOW (ref 31.5–35.7)
MCV: 87 fL (ref 79–97)
Monocytes Absolute: 1.1 10*3/uL — ABNORMAL HIGH (ref 0.1–0.9)
Monocytes: 11 %
Neutrophils Absolute: 5.5 10*3/uL (ref 1.4–7.0)
Neutrophils: 53 %
Platelets: 298 10*3/uL (ref 150–450)
RBC: 3.86 x10E6/uL (ref 3.77–5.28)
RDW: 13.1 % (ref 11.7–15.4)
WBC: 10.2 10*3/uL (ref 3.4–10.8)

## 2023-11-18 LAB — COMPREHENSIVE METABOLIC PANEL
ALT: 15 [IU]/L (ref 0–32)
AST: 22 [IU]/L (ref 0–40)
Albumin: 3.5 g/dL — ABNORMAL LOW (ref 3.9–4.9)
Alkaline Phosphatase: 217 [IU]/L — ABNORMAL HIGH (ref 44–121)
BUN/Creatinine Ratio: 14 (ref 12–28)
BUN: 13 mg/dL (ref 8–27)
Bilirubin Total: 0.2 mg/dL (ref 0.0–1.2)
CO2: 23 mmol/L (ref 20–29)
Calcium: 9.3 mg/dL (ref 8.7–10.3)
Chloride: 109 mmol/L — ABNORMAL HIGH (ref 96–106)
Creatinine, Ser: 0.96 mg/dL (ref 0.57–1.00)
Globulin, Total: 2.9 g/dL (ref 1.5–4.5)
Glucose: 121 mg/dL — ABNORMAL HIGH (ref 70–99)
Potassium: 4.6 mmol/L (ref 3.5–5.2)
Sodium: 144 mmol/L (ref 134–144)
Total Protein: 6.4 g/dL (ref 6.0–8.5)
eGFR: 65 mL/min/{1.73_m2} (ref >59)

## 2023-11-18 LAB — LIPID PANEL W/O CHOL/HDL RATIO
Cholesterol, Total: 121 mg/dL (ref 100–199)
HDL: 45 mg/dL (ref >39)
LDL Chol Calc (NIH): 60 mg/dL (ref 0–99)
Triglycerides: 79 mg/dL (ref 0–149)
VLDL Cholesterol Cal: 16 mg/dL (ref 5–40)

## 2023-11-18 NOTE — Progress Notes (Signed)
Contacted via MyChart - needs lab only visit in 4 weeks please   Good morning [name], your labs have returned: - Kidney function, creatinine and eGFR, remains normal, as is liver function, AST and ALT. But your alkaline phosphatase remains elevated, I suspect related to your gallstones. We will continue to monitor this. - A1c was 5.9%, still in prediabetic range. - Lipid panel looks fantastic, continue statin therapy. - CBC is showing some anemia again.  I would like to recheck hemoglobin and hematocrit in 4 weeks outpatient + check iron level.  Please ensure you eat lots of iron rich foods at home.  Staff will call to schedule lab only visit.  Any questions? Keep being stellar!!  Thank you for allowing me to participate in your care.  I appreciate you. Kindest regards, Red Mandt

## 2023-11-18 NOTE — Telephone Encounter (Signed)
Requested medications are due for refill today.  yes  Requested medications are on the active medications list.  yes  Last refill. 08/17/2023 #30 2 rf  Future visit scheduled.   yes  Notes to clinic.  Refill not delegated.    Requested Prescriptions  Pending Prescriptions Disp Refills   clonazePAM (KLONOPIN) 0.5 MG tablet [Pharmacy Med Name: CLONAZEPAM 0.5 MG TABLET] 30 tablet 2    Sig: TAKE 1 TABLET BY MOUTH EVERY DAY AS NEEDED FOR ANXIETY     Not Delegated - Psychiatry: Anxiolytics/Hypnotics 2 Failed - 11/18/2023  5:22 PM      Failed - This refill cannot be delegated      Passed - Urine Drug Screen completed in last 360 days      Passed - Patient is not pregnant      Passed - Valid encounter within last 6 months    Recent Outpatient Visits           Yesterday Chronic kidney disease, stage 3a (HCC)   [place] [facility] [place], Corrie Dandy T, NP   3 months ago Centrilobular emphysema (HCC)   [place] [facility] [place], [place] T, NP   6 months ago Depression, major, single episode, in partial remission (HCC)   [place] [facility] [place], [place] T, NP   7 months ago PVD (peripheral vascular disease) (HCC)   [place] [facility] [place], Corrie Dandy T, NP   8 months ago Chronic kidney disease, stage 3a (HCC)   [place] [facility] [place], Dorie Rank, NP       Future Appointments             In 2 months Cannady, Dorie Rank, NP  [facility], [facility]

## 2023-12-05 ENCOUNTER — Other Ambulatory Visit: Payer: Self-pay | Admitting: Nurse Practitioner

## 2023-12-07 NOTE — Telephone Encounter (Signed)
 Requested Prescriptions  Pending Prescriptions Disp Refills   busPIRone  (BUSPAR ) 10 MG tablet [Pharmacy Med Name: BUSPIRONE  HCL 10 MG TABLET] 540 tablet 1    Sig: TAKE 2 TABLETS BY MOUTH 3 TIMES A DAY     Psychiatry: Anxiolytics/Hypnotics - Non-controlled Passed - 12/07/2023  3:16 PM      Passed - Valid encounter within last 12 months    Recent Outpatient Visits           2 weeks ago Chronic kidney disease, stage 3a (HCC)   [place] [facility] [place], Melanie T, NP   3 months ago Centrilobular emphysema (HCC)   [place] [facility] [place], [place] T, NP   6 months ago Depression, major, single episode, in partial remission (HCC)   [place] [facility] [place], Melanie T, NP   7 months ago PVD (peripheral vascular disease) (HCC)   [place] [facility] [place], Melanie T, NP   8 months ago Chronic kidney disease, stage 3a (HCC)   [place] [facility] [place], Melanie DASEN, NP       Future Appointments             In 2 months Cannady, Jolene T, NP [place] [facility], [facility]

## 2023-12-08 ENCOUNTER — Other Ambulatory Visit: Payer: Self-pay | Admitting: Orthopedic Surgery

## 2023-12-08 DIAGNOSIS — M25512 Pain in left shoulder: Secondary | ICD-10-CM

## 2023-12-09 ENCOUNTER — Ambulatory Visit (INDEPENDENT_AMBULATORY_CARE_PROVIDER_SITE_OTHER): Payer: PPO | Admitting: Orthopedic Surgery

## 2023-12-09 DIAGNOSIS — G8929 Other chronic pain: Secondary | ICD-10-CM | POA: Diagnosis not present

## 2023-12-09 DIAGNOSIS — M25512 Pain in left shoulder: Secondary | ICD-10-CM

## 2023-12-09 NOTE — Patient Instructions (Signed)

## 2023-12-09 NOTE — Progress Notes (Signed)
 New Patient Visit  Assessment: [name] is a 68 y.o. female with the following: 1. Chronic left shoulder pain  Plan: [name] has pain in her left shoulder.  No specific injury.  This is been ongoing for several months.  It is limiting her motion.  She does use a walker to assist with ambulation.  Medications have not been effective.  I was able to review a chest CT scan from July, 2024.  Based on this, there are minimal degenerative changes of the glenohumeral joint.  I have offered her steroid injection, and she elected to proceed.  Follow up as needed  Procedure note injection Left shoulder    Verbal consent was obtained to inject the left shoulder, subacromial space Timeout was completed to confirm the site of injection.  The skin was prepped with alcohol and ethyl chloride was sprayed at the injection site.  A 21-gauge needle was used to inject 40 mg of Depo-Medrol and 1% lidocaine  (4 cc) into the subacromial space of the left shoulder using a posterolateral approach.  There were no complications. A sterile bandage was applied.   Follow-up: Return if symptoms worsen or fail to improve.  Subjective:  Chief Complaint  Patient presents with   Left Shoulder - Pain    Pain x 3 mo, NKI, decreased ROM.  Hurts from shld joint into the upper arm.    History of Present Illness: [name] is a 68 y.o. female who presents for evaluation of left shoulder pain.  She is right-hand dominant.  She has been having pain in the left shoulder for couple months.  This is restricting her motion.  She has not used a walker to assist with ambulation for a while.  She has limited overhead motion.  Pain radiates from the shoulder into the upper arm.  No numbness or tingling.  No recent injections.   Review of Systems: No fevers or chills No numbness or tingling No chest pain No shortness of breath No bowel or bladder dysfunction No GI distress No headaches   Medical  History:  Past Medical History:  Diagnosis Date   Anxiety    Aortic atherosclerosis (HCC)    Arthritis    Atrial fibrillation with RVR (HCC)    a.) CHA2DS2VASc = 4 (age, sex, HTN, vascular disease history);  b.) rate/rhythm maintained on oral sotolol; chronically anticoagulated with apixaban    Atypical chest pain    Benign essential tremor    Chronic kidney disease, stage 3a (HCC)    COPD (chronic obstructive pulmonary disease) (HCC)    DDD (degenerative disc disease), cervical    a.) s/p ACDF C6-C7   DDD (degenerative disc disease), lumbar    Enlarged heart    Fatty liver    GERD (gastroesophageal reflux disease)    Hiatal hernia    Hyperlipidemia    Hypertension    Insomnia    a.) takes melatonin PRN   Long term current use of anticoagulant    a.) apixaban    Mass of right adrenal gland (HCC)    a.) CT AP 08/12/2022: measured 3.3 cm   Monocytosis    Morbid obesity (HCC)    Nicotine  dependence    OSA on CPAP    Osteoporosis    Pancreatitis    Peripheral vascular disease (HCC)    Psoriasis    SOB (shortness of breath)    Umbilical hernia    Vitamin D  deficiency     Past Surgical History:  Procedure  Laterality Date   ABDOMINAL HYSTERECTOMY     ACHILLES TENDON REPAIR     Removed bone spur and repaired achilles tendon   ANTERIOR CERVICAL DECOMP/DISCECTOMY FUSION     APPENDECTOMY     COLONOSCOPY WITH PROPOFOL  N/A 01/17/2019   Procedure: COLONOSCOPY WITH PROPOFOL ;  Surgeon: Therisa Bi, MD;  Location: [facility] ENDOSCOPY;  Service: Gastroenterology;  Laterality: N/A;   FLEXIBLE BRONCHOSCOPY Bilateral 04/09/2023   Procedure: FLEXIBLE BRONCHOSCOPY;  Surgeon: Isadora Hose, MD;  Location: [facility] ORS;  Service: Pulmonary;  Laterality: Bilateral;   TONSILLECTOMY      Family History  Problem Relation Age of Onset   COPD Father    Heart disease Father    Hyperlipidemia Father    Hypertension Father    Dementia Father    Cancer Sister    Hyperlipidemia Sister     Hypertension Sister    Lymphoma Sister    Migraines Son    Stroke Maternal Grandfather    Heart attack Brother    Cancer Brother        colorectal and liver   Breast cancer Paternal Aunt    Breast cancer Paternal Aunt    Social History   Tobacco Use   Smoking status: Every Day    Current packs/day: 1.00    Average packs/day: 1 pack/day for 48.0 years (48.0 ttl pk-yrs)    Types: Cigarettes    Passive exposure: Past   Smokeless tobacco: Never  Vaping Use   Vaping status: Never Used  Substance Use Topics   Alcohol use: No    Alcohol/week: 0.0 standard drinks of alcohol   Drug use: No    Allergies  Allergen Reactions   Iodinated Contrast Media Rash    Had rash present to neck with PET scan contrast    Current Meds  Medication Sig   acetaminophen  (TYLENOL ) 500 MG tablet Take 1,000 mg by mouth in the morning and at bedtime.   albuterol  (PROAIR  HFA) 108 (90 Base) MCG/ACT inhaler Inhale 2 puffs into the lungs every 6 (six) hours as needed for wheezing or shortness of breath.   albuterol  (PROVENTIL ) (2.5 MG/3ML) 0.083% nebulizer solution Take 3 mLs (2.5 mg total) by nebulization in the morning and at bedtime.   apixaban  (ELIQUIS ) 5 MG TABS tablet Take 1 tablet (5 mg total) by mouth 2 (two) times daily.   ascorbic acid (VITAMIN C) 500 MG tablet Take 500 mg by mouth daily.   atorvastatin  (LIPITOR ) 80 MG tablet Take 1 tablet (80 mg total) by mouth daily. (Patient taking differently: Take 80 mg by mouth at bedtime.)   baclofen  (LIORESAL ) 10 MG tablet Take 1 tablet (10 mg total) by mouth 3 (three) times daily.   busPIRone  (BUSPAR ) 10 MG tablet TAKE 2 TABLETS BY MOUTH 3 TIMES A DAY   Calcium  Citrate-Vitamin D  (CALCIUM  + D PO) Take 1,000 mg by mouth daily.   Cholecalciferol  (VITAMIN D ) 50 MCG (2000 UT) CAPS Take by mouth.   clonazePAM  (KLONOPIN ) 0.5 MG tablet TAKE 1 TABLET BY MOUTH EVERY DAY AS NEEDED FOR ANXIETY   fluticasone  (FLONASE ) 50 MCG/ACT nasal spray Place 2 sprays into both  nostrils daily.   furosemide  (LASIX ) 20 MG tablet Take 1 tablet (20 mg total) by mouth daily. (Patient taking differently: Take 20 mg by mouth every morning.)   gabapentin  (NEURONTIN ) 800 MG tablet Take 1 tablet (800 mg total) by mouth in the morning and at bedtime.   hydrOXYzine  (ATARAX ) 10 MG tablet Take 1 tablet (10 mg total)  by mouth 3 (three) times daily as needed. (Patient taking differently: Take 10 mg by mouth 3 (three) times daily.)   Melatonin 10 MG TABS Take 1 tablet by mouth at bedtime.   Multiple Vitamins-Minerals (MULTIVITAMIN WITH MINERALS) tablet Take 1 tablet by mouth daily.   omeprazole  (PRILOSEC) 40 MG capsule Take 1 capsule (40 mg total) by mouth daily. (Patient taking differently: Take 40 mg by mouth every morning.)   potassium chloride  SA (KLOR-CON  M20) 20 MEQ tablet TAKE 1 TABLET BY MOUTH EVERY DAY   predniSONE  (STERAPRED UNI-PAK 21 TAB) 10 MG (21) TBPK tablet Take as instructed on package   sodium chloride  HYPERTONIC 3 % nebulizer solution Take by nebulization in the morning and at bedtime.   sotalol (BETAPACE) 80 MG tablet Take 80 mg by mouth 2 (two) times daily.   Tiotropium Bromide -Olodaterol (STIOLTO RESPIMAT ) 2.5-2.5 MCG/ACT AERS Inhale 2 puffs into the lungs daily.   venlafaxine  XR (EFFEXOR -XR) 150 MG 24 hr capsule Take 1 capsule (150 mg total) by mouth daily with breakfast.   vitamin B-12 (CYANOCOBALAMIN ) 1000 MCG tablet Take 1,000 mcg by mouth daily.    Objective: LMP  (LMP Unknown)   Physical Exam:  General: Elderly female., Alert and oriented., and No acute distress. Gait: Ambulates with the assistance of a walker.  Left shoulder without deformity.  No swelling.  No bruising.  Tenderness to palpation over the posterior lateral aspect the shoulder.  Forward flexion limited to 90 degrees.  Abduction limited to 90 degrees.  Passively I can get her beyond this.  Fingers are warm and well-perfused.  Sensation intact throughout left hand.  IMAGING: No new  imaging obtained today   New Medications:  No orders of the defined types were placed in this encounter.     Oneil DELENA Horde, MD  12/09/2023 11:32 AM

## 2023-12-16 ENCOUNTER — Other Ambulatory Visit: Payer: PPO

## 2023-12-16 ENCOUNTER — Other Ambulatory Visit: Payer: Self-pay | Admitting: Nurse Practitioner

## 2023-12-16 DIAGNOSIS — D649 Anemia, unspecified: Secondary | ICD-10-CM | POA: Diagnosis not present

## 2023-12-16 DIAGNOSIS — R748 Abnormal levels of other serum enzymes: Secondary | ICD-10-CM

## 2023-12-17 ENCOUNTER — Other Ambulatory Visit: Payer: Self-pay | Admitting: Nurse Practitioner

## 2023-12-17 ENCOUNTER — Encounter: Payer: Self-pay | Admitting: Nurse Practitioner

## 2023-12-17 DIAGNOSIS — J439 Emphysema, unspecified: Secondary | ICD-10-CM | POA: Diagnosis not present

## 2023-12-17 DIAGNOSIS — D649 Anemia, unspecified: Secondary | ICD-10-CM

## 2023-12-17 LAB — CBC WITH DIFFERENTIAL/PLATELET
Basophils Absolute: 0.1 10*3/uL (ref 0.0–0.2)
Basos: 1 %
EOS (ABSOLUTE): 0.2 10*3/uL (ref 0.0–0.4)
Eos: 2 %
Hematocrit: 33.8 % — ABNORMAL LOW (ref 34.0–46.6)
Hemoglobin: 9.9 g/dL — ABNORMAL LOW (ref 11.1–15.9)
Immature Grans (Abs): 0 10*3/uL (ref 0.0–0.1)
Immature Granulocytes: 0 %
Lymphocytes Absolute: 4.7 10*3/uL — ABNORMAL HIGH (ref 0.7–3.1)
Lymphs: 38 %
MCH: 24.5 pg — ABNORMAL LOW (ref 26.6–33.0)
MCHC: 29.3 g/dL — ABNORMAL LOW (ref 31.5–35.7)
MCV: 84 fL (ref 79–97)
Monocytes Absolute: 1.5 10*3/uL — ABNORMAL HIGH (ref 0.1–0.9)
Monocytes: 12 %
Neutrophils Absolute: 5.8 10*3/uL (ref 1.4–7.0)
Neutrophils: 47 %
Platelets: 451 10*3/uL — ABNORMAL HIGH (ref 150–450)
RBC: 4.04 x10E6/uL (ref 3.77–5.28)
RDW: 13.4 % (ref 11.7–15.4)
WBC: 12.2 10*3/uL — ABNORMAL HIGH (ref 3.4–10.8)

## 2023-12-17 LAB — ALKALINE PHOSPHATASE: Alkaline Phosphatase: 171 [IU]/L — ABNORMAL HIGH (ref 44–121)

## 2023-12-17 LAB — PTH, INTACT AND CALCIUM
Calcium: 9.3 mg/dL (ref 8.7–10.3)
PTH: 31 pg/mL (ref 15–65)

## 2023-12-17 LAB — GAMMA GT: GGT: 187 [IU]/L — ABNORMAL HIGH (ref 0–60)

## 2023-12-17 LAB — FERRITIN: Ferritin: 15 ng/mL (ref 15–150)

## 2023-12-17 LAB — TSH: TSH: 2.15 u[IU]/mL (ref 0.450–4.500)

## 2023-12-17 LAB — IRON: Iron: 32 ug/dL (ref 27–139)

## 2023-12-17 NOTE — Telephone Encounter (Signed)
Requested Prescriptions  Pending Prescriptions Disp Refills   baclofen (LIORESAL) 10 MG tablet [Pharmacy Med Name: BACLOFEN 10 MG TABLET] 135 tablet 4    Sig: TAKE 0.5 TABLETS (5 MG TOTAL) BY MOUTH 3 (THREE) TIMES DAILY.     Analgesics:  Muscle Relaxants - baclofen Passed - 12/17/2023  9:22 AM      Passed - Cr in normal range and within 180 days    Creatinine  Date Value Ref Range Status  05/17/2023 20.8 20.0 - 300.0 mg/dL Final  78/46/9629 5.28 0.60 - 1.30 mg/dL Final   Creatinine, Ser  Date Value Ref Range Status  11/17/2023 0.96 0.57 - 1.00 mg/dL Final         Passed - eGFR is 30 or above and within 180 days    EGFR (African American)  Date Value Ref Range Status  03/23/2014 >60  Final   GFR calc Af Amer  Date Value Ref Range Status  09/03/2020 63 >59 mL/min/1.73 Final    Comment:    **[facility] currently reports eGFR in compliance with the current**   recommendations of the [employer]. [facility] will   update reporting as new guidelines are published from the NKF-ASN   Task force.    EGFR (Non-African Amer.)  Date Value Ref Range Status  03/23/2014 >60  Final    Comment:    eGFR values <65mL/min/1.73 m2 may be an indication of chronic kidney disease (CKD). Calculated eGFR is useful in patients with stable renal function. The eGFR calculation will not be reliable in acutely ill patients when serum creatinine is changing rapidly. It is not useful in  patients on dialysis. The eGFR calculation may not be applicable to patients at the low and high extremes of body sizes, pregnant women, and vegetarians.    GFR, Estimated  Date Value Ref Range Status  03/31/2023 >60 >60 mL/min Final    Comment:    (NOTE) Calculated using the CKD-EPI Creatinine Equation (2021)    eGFR  Date Value Ref Range Status  11/17/2023 65 >59 mL/min/1.73 Final         Passed - Valid encounter within last 6 months    Recent Outpatient Visits           1 month ago Chronic  kidney disease, stage 3a (HCC)   [place] [facility] [place], Jolene T, NP   4 months ago Centrilobular emphysema (HCC)   [place] [facility], [place] T, NP   7 months ago Depression, major, single episode, in partial remission (HCC)   [place] [facility] [place], [place] T, NP   8 months ago PVD (peripheral vascular disease) (HCC)   [place] [facility] [place], Corrie Dandy T, NP   9 months ago Chronic kidney disease, stage 3a (HCC)   [place] [facility] [place], Dorie Rank, NP       Future Appointments             In 2 months Cannady, [place] T, NP  [facility]             omeprazole (PRILOSEC) 40 MG capsule [Pharmacy Med Name: OMEPRAZOLE DR 40 MG CAPSULE] 90 capsule 0    Sig: TAKE 1 CAPSULE (40 MG TOTAL) BY MOUTH DAILY.     Gastroenterology: Proton Pump Inhibitors Passed - 12/17/2023  9:22 AM      Passed - Valid encounter within last 12 months    Recent Outpatient Visits  1 month ago Chronic kidney disease, stage 3a (HCC)   [place] [facility] [place], Corrie Dandy T, NP   4 months ago Centrilobular emphysema (HCC)   [place] [facility] [place], [place] T, NP   7 months ago Depression, major, single episode, in partial remission (HCC)   [place] [facility] [place], Corrie Dandy T, NP   8 months ago PVD (peripheral vascular disease) (HCC)   [place] [facility] Cannady, Corrie Dandy T, NP   9 months ago Chronic kidney disease, stage 3a (HCC)   [place] [facility] [place], Dorie Rank, NP       Future Appointments             In 2 months Cannady, Dorie Rank, NP [place] [facility], [facility]             hydrOXYzine (ATARAX) 10 MG tablet [Pharmacy Med Name: HYDROXYZINE HCL 10 MG TABLET] 270 tablet 0    Sig: TAKE 1 TABLET BY MOUTH THREE TIMES A DAY AS NEEDED     Ear, Nose,  and Throat:  Antihistamines 2 Passed - 12/17/2023  9:22 AM      Passed - Cr in normal range and within 360 days    Creatinine  Date Value Ref Range Status  05/17/2023 20.8 20.0 - 300.0 mg/dL Final  40/98/1191 4.78 0.60 - 1.30 mg/dL Final   Creatinine, Ser  Date Value Ref Range Status  11/17/2023 0.96 0.57 - 1.00 mg/dL Final         Passed - Valid encounter within last 12 months    Recent Outpatient Visits           1 month ago Chronic kidney disease, stage 3a (HCC)   [place] [facility] [place], Jolene T, NP   4 months ago Centrilobular emphysema (HCC)   [place] [facility] [place], [place] T, NP   7 months ago Depression, major, single episode, in partial remission (HCC)   [place] [facility] [place], [place] T, NP   8 months ago PVD (peripheral vascular disease) (HCC)   [place] [facility] [place], Jolene T, NP   9 months ago Chronic kidney disease, stage 3a (HCC)   [place] [facility] [place], Dorie Rank, NP       Future Appointments             In 2 months Cannady, Dorie Rank, NP [place] [facility], [facility]

## 2023-12-17 NOTE — Progress Notes (Signed)
Contacted via MyChart - lab only visit in one week please   Good evening [name], your labs have returned: - CBC is showing trend down in hemoglobin and hematocrit.  Have you had any bleeding from anywhere recently?  Iron is on lower side of normal too, please increase iron in diet (such as deep green leafy vegetables) and start taking over the counter Slow Fe once daily (this iron supplement causes less constipation). - Alkaline phosphatase and GGT are elevated, this is related often to your gall bladder.  Any recent right upper belly pain?  Let me know.  I want to recheck CBC in one week since we are seeing this trend down.  Any questions? Keep being amazing!!  Thank you for allowing me to participate in your care.  I appreciate you. Kindest regards, Kashauna Celmer

## 2023-12-20 ENCOUNTER — Other Ambulatory Visit: Payer: Self-pay | Admitting: Nurse Practitioner

## 2023-12-20 NOTE — Telephone Encounter (Signed)
Requested Prescriptions  Pending Prescriptions Disp Refills   atorvastatin (LIPITOR) 80 MG tablet [Pharmacy Med Name: ATORVASTATIN 80 MG TABLET] 90 tablet 0    Sig: TAKE 1 TABLET BY MOUTH EVERY DAY     Cardiovascular:  Antilipid - Statins Failed - 12/20/2023 10:19 AM      Failed - Lipid Panel in normal range within the last 12 months    Cholesterol, Total  Date Value Ref Range Status  11/17/2023 121 100 - 199 mg/dL Final   Cholesterol Piccolo, Waived  Date Value Ref Range Status  12/01/2016 186 <200 mg/dL Final    Comment:                            Desirable                <200                         Borderline High      200- 239                         High                     >239    LDL Chol Calc (NIH)  Date Value Ref Range Status  11/17/2023 60 0 - 99 mg/dL Final   HDL  Date Value Ref Range Status  11/17/2023 45 >39 mg/dL Final   Triglycerides  Date Value Ref Range Status  11/17/2023 79 0 - 149 mg/dL Final   Triglycerides Piccolo,Waived  Date Value Ref Range Status  12/01/2016 177 (H) <150 mg/dL Final    Comment:                            Normal                   <150                         Borderline High     150 - 199                         High                200 - 499                         Very High                >499          Passed - Patient is not pregnant      Passed - Valid encounter within last 12 months    Recent Outpatient Visits           1 month ago Chronic kidney disease, stage 3a (HCC)   [place] [facility] Cannady, Jolene T, NP   4 months ago Centrilobular emphysema (HCC)   [facility] [place], [place] T, NP   7 months ago Depression, major, single episode, in partial remission (HCC)   [place] [facility] [place], [place] T, NP   8 months ago PVD (peripheral vascular disease) (HCC)   [facility] [place], Corrie Dandy T, NP   9 months  ago Chronic kidney  disease, stage 3a (HCC)   [place] [facility] [place], Dorie Rank, NP       Future Appointments             In 1 month Cannady, Dorie Rank, NP [facility], [facility]

## 2023-12-24 ENCOUNTER — Other Ambulatory Visit: Payer: PPO

## 2023-12-24 ENCOUNTER — Encounter: Payer: Self-pay | Admitting: Nurse Practitioner

## 2023-12-24 ENCOUNTER — Ambulatory Visit
Admission: RE | Admit: 2023-12-24 | Discharge: 2023-12-24 | Disposition: A | Discharge status: Home / Self Care | Payer: PPO | Source: Ambulatory Visit | Attending: Nurse Practitioner | Admitting: Nurse Practitioner

## 2023-12-24 DIAGNOSIS — D649 Anemia, unspecified: Secondary | ICD-10-CM | POA: Diagnosis not present

## 2023-12-24 DIAGNOSIS — K769 Liver disease, unspecified: Secondary | ICD-10-CM

## 2023-12-24 DIAGNOSIS — R141 Gas pain: Secondary | ICD-10-CM | POA: Diagnosis not present

## 2023-12-24 DIAGNOSIS — K8020 Calculus of gallbladder without cholecystitis without obstruction: Secondary | ICD-10-CM | POA: Diagnosis not present

## 2023-12-24 DIAGNOSIS — K7689 Other specified diseases of liver: Secondary | ICD-10-CM | POA: Diagnosis not present

## 2023-12-24 DIAGNOSIS — R932 Abnormal findings on diagnostic imaging of liver and biliary tract: Secondary | ICD-10-CM | POA: Diagnosis not present

## 2023-12-24 NOTE — Progress Notes (Signed)
Contacted via MyChart   Good morning [name], your imaging has returned and there is one gallstone present but no signs of inflammation.  This is good, if you get consistent upper right abdomen pain with nausea and vomiting then we would send to general surgery.  There is some liver diease present which can be due to many reasons, including alcohol use, genetic issues, etc..  I do recommend a visit with GI to further assess and offer recommendations.  Are you okay with this? Any questions? Keep being amazing!!  Thank you for allowing me to participate in your care.  I appreciate you. Kindest regards, Levy Wellman

## 2023-12-25 ENCOUNTER — Other Ambulatory Visit: Payer: Self-pay | Admitting: Nurse Practitioner

## 2023-12-25 DIAGNOSIS — D649 Anemia, unspecified: Secondary | ICD-10-CM

## 2023-12-25 DIAGNOSIS — K769 Liver disease, unspecified: Secondary | ICD-10-CM

## 2023-12-25 LAB — CBC WITH DIFFERENTIAL/PLATELET
Basophils Absolute: 0.1 10*3/uL (ref 0.0–0.2)
Basos: 1 %
EOS (ABSOLUTE): 0.2 10*3/uL (ref 0.0–0.4)
Eos: 2 %
Hematocrit: 32.3 % — ABNORMAL LOW (ref 34.0–46.6)
Hemoglobin: 9.7 g/dL — ABNORMAL LOW (ref 11.1–15.9)
Immature Grans (Abs): 0 10*3/uL (ref 0.0–0.1)
Immature Granulocytes: 0 %
Lymphocytes Absolute: 3.9 10*3/uL — ABNORMAL HIGH (ref 0.7–3.1)
Lymphs: 32 %
MCH: 25.1 pg — ABNORMAL LOW (ref 26.6–33.0)
MCHC: 30 g/dL — ABNORMAL LOW (ref 31.5–35.7)
MCV: 84 fL (ref 79–97)
Monocytes Absolute: 1.6 10*3/uL — ABNORMAL HIGH (ref 0.1–0.9)
Monocytes: 13 %
Neutrophils Absolute: 6.4 10*3/uL (ref 1.4–7.0)
Neutrophils: 52 %
Platelets: 311 10*3/uL (ref 150–450)
RBC: 3.87 x10E6/uL (ref 3.77–5.28)
RDW: 13.9 % (ref 11.7–15.4)
WBC: 12.2 10*3/uL — ABNORMAL HIGH (ref 3.4–10.8)

## 2023-12-25 NOTE — Progress Notes (Signed)
Please schedule outpatient lab visit early this week for patient to recheck CBC and check iron level. Thank you.

## 2023-12-27 DIAGNOSIS — D649 Anemia, unspecified: Secondary | ICD-10-CM | POA: Insufficient documentation

## 2024-01-04 ENCOUNTER — Inpatient Hospital Stay: Payer: PPO

## 2024-01-04 ENCOUNTER — Telehealth: Payer: Self-pay

## 2024-01-04 ENCOUNTER — Other Ambulatory Visit: Payer: PPO

## 2024-01-04 ENCOUNTER — Encounter: Payer: Self-pay | Admitting: Oncology

## 2024-01-04 ENCOUNTER — Inpatient Hospital Stay: Payer: PPO | Attending: Oncology | Admitting: Oncology

## 2024-01-04 VITALS — BP 161/59 | HR 60 | Temp 99.4°F | Resp 18 | Ht 63.0 in | Wt 242.0 lb

## 2024-01-04 DIAGNOSIS — D509 Iron deficiency anemia, unspecified: Secondary | ICD-10-CM | POA: Insufficient documentation

## 2024-01-04 DIAGNOSIS — Z79899 Other long term (current) drug therapy: Secondary | ICD-10-CM | POA: Insufficient documentation

## 2024-01-04 DIAGNOSIS — D649 Anemia, unspecified: Secondary | ICD-10-CM

## 2024-01-04 DIAGNOSIS — D72820 Lymphocytosis (symptomatic): Secondary | ICD-10-CM | POA: Diagnosis not present

## 2024-01-04 DIAGNOSIS — F17210 Nicotine dependence, cigarettes, uncomplicated: Secondary | ICD-10-CM | POA: Diagnosis not present

## 2024-01-04 DIAGNOSIS — D508 Other iron deficiency anemias: Secondary | ICD-10-CM

## 2024-01-04 NOTE — Telephone Encounter (Signed)
I the patient called in to schedule an office visit. I inform her that I must put her on waiting list.

## 2024-01-04 NOTE — Progress Notes (Signed)
 Hematology/Oncology Consult note [facility]  Telephone:([phone] [phone]  Patient Care Team: Valerio Melanie DASEN, NP as PCP - General (Nurse Practitioner) Melanee Annah BROCKS, MD as Consulting Physician (Oncology)   Name of the patient: [name]  [id number]  05/04/1956   Date of visit: 01/04/24  Diagnosis- 1.  Lymphocytosis/monocytosis reactive 2.  Normocytic anemia likely secondary to iron deficiency  Chief complaint/ Reason for visit-reestablish follow-up for anemia  Heme/Onc history: Patient is a 68 year old female who was seen hematology in the past for leukocytosis/lymphocytosis which is thought to be reactive secondary to smoking.  She has had flow cytometry, BCR-ABL FISH testing back in 2021 which were unremarkable.  White cell count mostly fluctuates between 11-16.  Hemoglobin has been normal up until early 2023.  She has now been referred for worsening anemia  Interval history-patient is here with her son today.  She reports ongoing fatigue.  Denies any blood loss in her stool or urine  ECOG PS- 2 Pain scale- 0   Review of systems- Review of Systems  Constitutional:  Positive for malaise/fatigue. Negative for chills, fever and weight loss.  HENT:  Negative for congestion, ear discharge and nosebleeds.   Eyes:  Negative for blurred vision.  Respiratory:  Negative for cough, hemoptysis, sputum production, shortness of breath and wheezing.   Cardiovascular:  Negative for chest pain, palpitations, orthopnea and claudication.  Gastrointestinal:  Negative for abdominal pain, blood in stool, constipation, diarrhea, heartburn, melena, nausea and vomiting.  Genitourinary:  Negative for dysuria, flank pain, frequency, hematuria and urgency.  Musculoskeletal:  Negative for back pain, joint pain and myalgias.  Skin:  Negative for rash.  Neurological:  Negative for dizziness, tingling, focal weakness, seizures, weakness and headaches.   Endo/Heme/Allergies:  Does not bruise/bleed easily.  Psychiatric/Behavioral:  Negative for depression and suicidal ideas. The patient does not have insomnia.       Allergies  Allergen Reactions   Iodinated Contrast Media Rash    Had rash present to neck with PET scan contrast     Past Medical History:  Diagnosis Date   Anxiety    Aortic atherosclerosis (HCC)    Arthritis    Atrial fibrillation with RVR (HCC)    a.) CHA2DS2VASc = 4 (age, sex, HTN, vascular disease history);  b.) rate/rhythm maintained on oral sotolol; chronically anticoagulated with apixaban    Atypical chest pain    Benign essential tremor    Chronic kidney disease, stage 3a (HCC)    COPD (chronic obstructive pulmonary disease) (HCC)    DDD (degenerative disc disease), cervical    a.) s/p ACDF C6-C7   DDD (degenerative disc disease), lumbar    Enlarged heart    Fatty liver    GERD (gastroesophageal reflux disease)    Hiatal hernia    Hyperlipidemia    Hypertension    Insomnia    a.) takes melatonin PRN   Long term current use of anticoagulant    a.) apixaban    Mass of right adrenal gland (HCC)    a.) CT AP 08/12/2022: measured 3.3 cm   Monocytosis    Morbid obesity (HCC)    Nicotine  dependence    OSA on CPAP    Osteoporosis    Pancreatitis    Peripheral vascular disease (HCC)    Psoriasis    SOB (shortness of breath)    Umbilical hernia    Vitamin D  deficiency      Past Surgical History:  Procedure Laterality Date  ABDOMINAL HYSTERECTOMY     ACHILLES TENDON REPAIR     Removed bone spur and repaired achilles tendon   ANTERIOR CERVICAL DECOMP/DISCECTOMY FUSION     APPENDECTOMY     COLONOSCOPY WITH PROPOFOL  N/A 01/17/2019   Procedure: COLONOSCOPY WITH PROPOFOL ;  Surgeon: Therisa Bi, MD;  Location: [facility] ENDOSCOPY;  Service: Gastroenterology;  Laterality: N/A;   FLEXIBLE BRONCHOSCOPY Bilateral 04/09/2023   Procedure: FLEXIBLE BRONCHOSCOPY;  Surgeon: Isadora Hose, MD;  Location: [facility] ORS;   Service: Pulmonary;  Laterality: Bilateral;   TONSILLECTOMY      Social History   Socioeconomic History   Marital status: Widowed    Spouse name: Not on file   Number of children: Not on file   Years of education: Not on file   Highest education level: GED or equivalent  Occupational History   Occupation: disabled  Tobacco Use   Smoking status: Every Day    Current packs/day: 1.00    Average packs/day: 1 pack/day for 48.0 years (48.0 ttl pk-yrs)    Types: Cigarettes    Passive exposure: Past   Smokeless tobacco: Never  Vaping Use   Vaping status: Never Used  Substance and Sexual Activity   Alcohol use: No    Alcohol/week: 0.0 standard drinks of alcohol   Drug use: No   Sexual activity: Never    Partners: Male  Other Topics Concern   Not on file  Social History Narrative   Not on file   Social Drivers of Health   Financial Resource Strain: Low Risk  (04/10/2023)   Overall Financial Resource Strain (CARDIA)    Difficulty of Paying Living Expenses: Not very hard  Food Insecurity: No Food Insecurity (04/10/2023)   Hunger Vital Sign    Worried About Running Out of Food in the Last Year: Never true    Ran Out of Food in the Last Year: Never true  Transportation Needs: No Transportation Needs (04/10/2023)   PRAPARE - Administrator, Civil Service (Medical): No    Lack of Transportation (Non-Medical): No  Physical Activity: Inactive (04/10/2023)   Exercise Vital Sign    Days of Exercise per Week: 0 days    Minutes of Exercise per Session: 0 min  Stress: Stress Concern Present (04/10/2023)   [employer] of Occupational Health - Occupational Stress Questionnaire    Feeling of Stress : Rather much  Social Connections: Socially Isolated (04/10/2023)   Social Connection and Isolation Panel [NHANES]    Frequency of Communication with Friends and Family: More than three times a week    Frequency of Social Gatherings with Friends and Family: More than three times a  week    Attends Religious Services: Never    Database Administrator or Organizations: No    Attends Banker Meetings: Never    Marital Status: Widowed  Intimate Partner Violence: Not At Risk (01/12/2023)   Humiliation, Afraid, Rape, and Kick questionnaire    Fear of Current or Ex-Partner: No    Emotionally Abused: No    Physically Abused: No    Sexually Abused: No    Family History  Problem Relation Age of Onset   COPD Father    Heart disease Father    Hyperlipidemia Father    Hypertension Father    Dementia Father    Cancer Sister    Hyperlipidemia Sister    Hypertension Sister    Lymphoma Sister    Migraines Son    Stroke Maternal Grandfather  Heart attack Brother    Cancer Brother        colorectal and liver   Breast cancer Paternal Aunt    Breast cancer Paternal Aunt      Current Outpatient Medications:    acetaminophen  (TYLENOL ) 500 MG tablet, Take 1,000 mg by mouth in the morning and at bedtime., Disp: , Rfl:    albuterol  (PROAIR  HFA) 108 (90 Base) MCG/ACT inhaler, Inhale 2 puffs into the lungs every 6 (six) hours as needed for wheezing or shortness of breath., Disp: 18 g, Rfl: 4   albuterol  (PROVENTIL ) (2.5 MG/3ML) 0.083% nebulizer solution, Take 3 mLs (2.5 mg total) by nebulization in the morning and at bedtime., Disp: 75 mL, Rfl: 12   apixaban  (ELIQUIS ) 5 MG TABS tablet, Take 1 tablet (5 mg total) by mouth 2 (two) times daily., Disp: 60 tablet, Rfl: 1   ascorbic acid (VITAMIN C) 500 MG tablet, Take 500 mg by mouth daily., Disp: , Rfl:    atorvastatin  (LIPITOR ) 80 MG tablet, TAKE 1 TABLET BY MOUTH EVERY DAY, Disp: 90 tablet, Rfl: 0   baclofen  (LIORESAL ) 10 MG tablet, Take 1 tablet (10 mg total) by mouth 3 (three) times daily., Disp: 135 each, Rfl: 4   busPIRone  (BUSPAR ) 10 MG tablet, TAKE 2 TABLETS BY MOUTH 3 TIMES A DAY, Disp: 540 tablet, Rfl: 1   Calcium  Citrate-Vitamin D  (CALCIUM  + D PO), Take 1,000 mg by mouth daily., Disp: , Rfl:     Cholecalciferol  (VITAMIN D ) 50 MCG (2000 UT) CAPS, Take by mouth., Disp: , Rfl:    clonazePAM  (KLONOPIN ) 0.5 MG tablet, TAKE 1 TABLET BY MOUTH EVERY DAY AS NEEDED FOR ANXIETY, Disp: 30 tablet, Rfl: 2   fluticasone  (FLONASE ) 50 MCG/ACT nasal spray, Place 2 sprays into both nostrils daily., Disp: 16 g, Rfl: 6   furosemide  (LASIX ) 20 MG tablet, Take 1 tablet (20 mg total) by mouth daily. (Patient taking differently: Take 20 mg by mouth every morning.), Disp: 90 tablet, Rfl: 4   gabapentin  (NEURONTIN ) 800 MG tablet, Take 1 tablet (800 mg total) by mouth in the morning and at bedtime., Disp: 180 tablet, Rfl: 4   hydrOXYzine  (ATARAX ) 10 MG tablet, TAKE 1 TABLET BY MOUTH THREE TIMES A DAY AS NEEDED, Disp: 270 tablet, Rfl: 0   KLOR-CON  M20 20 MEQ tablet, TAKE 1 TABLET BY MOUTH EVERY DAY, Disp: 90 tablet, Rfl: 0   Melatonin 10 MG TABS, Take 1 tablet by mouth at bedtime., Disp: , Rfl:    Multiple Vitamins-Minerals (MULTIVITAMIN WITH MINERALS) tablet, Take 1 tablet by mouth daily., Disp: , Rfl:    omeprazole  (PRILOSEC) 40 MG capsule, TAKE 1 CAPSULE (40 MG TOTAL) BY MOUTH DAILY., Disp: 90 capsule, Rfl: 0   predniSONE  (STERAPRED UNI-PAK 21 TAB) 10 MG (21) TBPK tablet, Take as instructed on package, Disp: 21 each, Rfl: 0   sodium chloride  HYPERTONIC 3 % nebulizer solution, Take by nebulization in the morning and at bedtime., Disp: 750 mL, Rfl: 12   sotalol (BETAPACE) 80 MG tablet, Take 80 mg by mouth 2 (two) times daily., Disp: , Rfl:    Tiotropium Bromide -Olodaterol (STIOLTO RESPIMAT ) 2.5-2.5 MCG/ACT AERS, Inhale 2 puffs into the lungs daily., Disp: 120 each, Rfl: 12   venlafaxine  XR (EFFEXOR -XR) 150 MG 24 hr capsule, Take 1 capsule (150 mg total) by mouth daily with breakfast., Disp: 90 capsule, Rfl: 4   vitamin B-12 (CYANOCOBALAMIN ) 1000 MCG tablet, Take 1,000 mcg by mouth daily., Disp: , Rfl:   Physical exam:  Vitals:   01/04/24 1435  BP: (!) 161/59  Pulse: 60  Resp: 18  Temp: 99.4 F (37.4 C)   TempSrc: Tympanic  SpO2: 96%  Weight: 242 lb (109.8 kg)  Height: 5' 3 (1.6 m)   Physical Exam Constitutional:      Comments: Sitting in a wheelchair and appears in no acute distress  Cardiovascular:     Rate and Rhythm: Normal rate and regular rhythm.     Heart sounds: Normal heart sounds.  Pulmonary:     Effort: Pulmonary effort is normal.     Breath sounds: Normal breath sounds.  Skin:    General: Skin is warm and dry.  Neurological:     Mental Status: She is alert and oriented to person, place, and time.         Latest Ref Rng & Units 12/16/2023    3:20 PM  CMP  Calcium  8.7 - 10.3 mg/dL 9.3   Alkaline Phos 44 - 121 IU/L 171       Latest Ref Rng & Units 12/24/2023   10:43 AM  CBC  WBC 3.4 - 10.8 x10E3/uL 12.2   Hemoglobin 11.1 - 15.9 g/dL 9.7   Hematocrit 65.9 - 46.6 % 32.3   Platelets 150 - 450 x10E3/uL 311     No images are attached to the encounter.  US  Abdomen Limited RUQ (LIVER/GB) Result Date: 12/24/2023 CLINICAL DATA:  anemia and elevated alk phos + ggt with some abdominal pain and gas. EXAM: ULTRASOUND ABDOMEN LIMITED RIGHT UPPER QUADRANT COMPARISON:  None Available. FINDINGS: Gallbladder: The gallbladder is physiologically distended. There is a single 1.5 cm sized gallstone without imaging signs of acute cholecystitis. No abnormal wall thickening or pericholecystic free fluid. Sonographic Murphy sign was negative as per the technologist. Common bile duct: Diameter: Less than 5 mm.  No intrahepatic bile duct dilation. Liver: There is increased hepatic echogenicity which reduces the sensitivity of ultrasound for the detection of focal masses. That being said, no focal mass is identified. There is subtle liver surface irregularity/nodularity, concerning for underlying liver parenchymal disease. Correlate clinically. Portal vein is patent on color Doppler imaging with normal direction of blood flow towards the liver. Other: None. IMPRESSION: 1. There is a single 1.5  cm gallstone without imaging signs of acute cholecystitis. 2. There is subtle liver surface irregularity/nodularity, concerning for underlying liver parenchymal disease. Correlate clinically. 3. Increased hepatic echogenicity, a nonspecific finding that is most commonly seen on the basis of steatosis or liver parenchymal disease. Electronically Signed   By: Ree Molt M.D.   On: 12/24/2023 10:50     Assessment and plan- Patient is a 68 y.o. female with history of reactive lymphocytosis now referred for normocytic anemia  Normocytic anemia: Labs done 2 weeks ago suggestive of iron deficiency given that her ferritin levels are 15.  She would benefit from IV iron.  Discussed the side effects of IV iron including all but not limited to possible risk of infusion reaction.  Patient understands and agrees to proceed as planned.  Her hemoglobin was normal at 13 a year ago and since then has drifted down and is presently at 9.7.  CBC ferritin and iron studies B12 folate myeloma panel and serum free light chains in 2 months and I will see her thereafter.  Patient had a last colonoscopy in 2020 by Dr. Therisa and a repeat colonoscopy was recommended in 3 to 5 years.  I will reach out to him to see if she will need  repeat GI evaluation at this time given her ongoing iron deficiency.   Visit Diagnosis 1. Lymphocytosis   2. Normocytic anemia   3. Other iron deficiency anemia      Dr. Annah Skene, MD, MPH [facility] [phone] 01/04/2024 3:35 PM

## 2024-01-04 NOTE — Telephone Encounter (Signed)
Ok for [facility] to review and schedule.

## 2024-01-04 NOTE — Telephone Encounter (Signed)
-----   Message from Marjie Skiff sent at 12/25/2023 10:16 AM EST ----- Please schedule outpatient lab visit early this week for patient to recheck CBC and check iron level. Thank you.

## 2024-01-05 ENCOUNTER — Encounter: Payer: Self-pay | Admitting: Nurse Practitioner

## 2024-01-05 ENCOUNTER — Other Ambulatory Visit: Payer: PPO

## 2024-01-05 ENCOUNTER — Telehealth: Payer: Self-pay

## 2024-01-05 LAB — CBC WITH DIFFERENTIAL/PLATELET
Basophils Absolute: 0.1 10*3/uL (ref 0.0–0.2)
Basos: 1 %
EOS (ABSOLUTE): 0.3 10*3/uL (ref 0.0–0.4)
Eos: 2 %
Hematocrit: 32.1 % — ABNORMAL LOW (ref 34.0–46.6)
Hemoglobin: 9.8 g/dL — ABNORMAL LOW (ref 11.1–15.9)
Immature Grans (Abs): 0 10*3/uL (ref 0.0–0.1)
Immature Granulocytes: 0 %
Lymphocytes Absolute: 5.1 10*3/uL — ABNORMAL HIGH (ref 0.7–3.1)
Lymphs: 36 %
MCH: 25.5 pg — ABNORMAL LOW (ref 26.6–33.0)
MCHC: 30.5 g/dL — ABNORMAL LOW (ref 31.5–35.7)
MCV: 83 fL (ref 79–97)
Monocytes Absolute: 1.5 10*3/uL — ABNORMAL HIGH (ref 0.1–0.9)
Monocytes: 11 %
Neutrophils Absolute: 6.9 10*3/uL (ref 1.4–7.0)
Neutrophils: 50 %
Platelets: 341 10*3/uL (ref 150–450)
RBC: 3.85 x10E6/uL (ref 3.77–5.28)
RDW: 14.2 % (ref 11.7–15.4)
WBC: 14 10*3/uL — ABNORMAL HIGH (ref 3.4–10.8)

## 2024-01-05 LAB — IRON: Iron: 27 ug/dL (ref 27–139)

## 2024-01-05 LAB — SEDIMENTATION RATE: Sed Rate: 39 mm/h (ref 0–40)

## 2024-01-05 LAB — C-REACTIVE PROTEIN: CRP: 4 mg/L (ref 0–10)

## 2024-01-05 LAB — FERRITIN: Ferritin: 13 ng/mL — ABNORMAL LOW (ref 15–150)

## 2024-01-05 NOTE — Telephone Encounter (Signed)
-----   Message from [name] sent at 01/05/2024 10:38 AM EST ----- Andrea will need office visit with me or Ellouise for iron def anemia pleas ----- Message ----- From: Melanee Annah BROCKS, MD Sent: 01/04/2024   3:40 PM EST To: Ruford Dudzinski ONEIDA Polio, NP; Kathleen Kung, MD

## 2024-01-05 NOTE — Progress Notes (Signed)
 Contacted via MyChart   Good morning [name], your labs have returned and continue to show some anemia with low hemoglobin and hematocrit + iron on low side of normal.  White blood cell count remains elevated per baseline.  I see where you saw hematology yesterday, wonderful, and they are discussing iron infusions and possible return to GI.  I will follow along with you there.  Any questions? Keep being stellar!!  Thank you for allowing me to participate in your care.  I appreciate you. Kindest regards, Woodruff Skirvin

## 2024-01-06 ENCOUNTER — Inpatient Hospital Stay: Payer: PPO

## 2024-01-06 VITALS — BP 142/59 | HR 62 | Temp 99.7°F | Resp 16

## 2024-01-06 DIAGNOSIS — D72820 Lymphocytosis (symptomatic): Secondary | ICD-10-CM | POA: Diagnosis not present

## 2024-01-06 DIAGNOSIS — D509 Iron deficiency anemia, unspecified: Secondary | ICD-10-CM

## 2024-01-06 DIAGNOSIS — D508 Other iron deficiency anemias: Secondary | ICD-10-CM

## 2024-01-06 MED ORDER — FERRIC DERISOMALTOSE(ONE DOSE) 1000 MG/10ML IV SOLN
1000.0000 mg | Freq: Once | INTRAVENOUS | Status: AC
  Administered 2024-01-06: 1000 mg via INTRAVENOUS
  Filled 2024-01-06: qty 10

## 2024-01-06 MED ORDER — SODIUM CHLORIDE 0.9 % IV SOLN
INTRAVENOUS | Status: DC
  Filled 2024-01-06: qty 250

## 2024-01-06 NOTE — Patient Instructions (Signed)

## 2024-01-06 NOTE — Telephone Encounter (Signed)
 Okay I will schedule the patient.

## 2024-01-17 DIAGNOSIS — I739 Peripheral vascular disease, unspecified: Secondary | ICD-10-CM | POA: Diagnosis not present

## 2024-01-17 DIAGNOSIS — J439 Emphysema, unspecified: Secondary | ICD-10-CM | POA: Diagnosis not present

## 2024-01-17 DIAGNOSIS — N1831 Chronic kidney disease, stage 3a: Secondary | ICD-10-CM | POA: Diagnosis not present

## 2024-01-17 DIAGNOSIS — I4891 Unspecified atrial fibrillation: Secondary | ICD-10-CM | POA: Diagnosis not present

## 2024-01-17 DIAGNOSIS — Z720 Tobacco use: Secondary | ICD-10-CM | POA: Diagnosis not present

## 2024-01-17 DIAGNOSIS — J45909 Unspecified asthma, uncomplicated: Secondary | ICD-10-CM | POA: Diagnosis not present

## 2024-01-17 DIAGNOSIS — E782 Mixed hyperlipidemia: Secondary | ICD-10-CM | POA: Diagnosis not present

## 2024-01-17 DIAGNOSIS — J449 Chronic obstructive pulmonary disease, unspecified: Secondary | ICD-10-CM | POA: Diagnosis not present

## 2024-01-17 DIAGNOSIS — K219 Gastro-esophageal reflux disease without esophagitis: Secondary | ICD-10-CM | POA: Diagnosis not present

## 2024-01-17 DIAGNOSIS — G4730 Sleep apnea, unspecified: Secondary | ICD-10-CM | POA: Diagnosis not present

## 2024-01-17 DIAGNOSIS — Z01818 Encounter for other preprocedural examination: Secondary | ICD-10-CM | POA: Diagnosis not present

## 2024-01-17 DIAGNOSIS — I10 Essential (primary) hypertension: Secondary | ICD-10-CM | POA: Diagnosis not present

## 2024-01-18 ENCOUNTER — Ambulatory Visit: Payer: PPO | Admitting: Emergency Medicine

## 2024-01-18 ENCOUNTER — Encounter: Payer: Self-pay | Admitting: Oncology

## 2024-01-18 VITALS — Ht 63.0 in | Wt 230.0 lb

## 2024-01-18 DIAGNOSIS — Z0000 Encounter for general adult medical examination without abnormal findings: Secondary | ICD-10-CM

## 2024-01-18 DIAGNOSIS — Z1231 Encounter for screening mammogram for malignant neoplasm of breast: Secondary | ICD-10-CM

## 2024-01-18 NOTE — Progress Notes (Signed)
Subjective:   [name] is a 68 y.o. female who presents for Medicare Annual (Subsequent) preventive examination.  This patient declined Interactive audio and Acupuncturist. Therefore the visit was completed with audio only.   Visit Complete: Virtual I connected with  [name] on 01/18/24 by a audio enabled telemedicine application and verified that I am speaking with the correct person using two identifiers.  Patient Location: Home  Provider Location: Office/Clinic  I discussed the limitations of evaluation and management by telemedicine. The patient expressed understanding and agreed to proceed.  Vital Signs: Because this visit was a virtual/telehealth visit, some criteria may be missing or patient reported. Any vitals not documented were not able to be obtained and vitals that have been documented are patient reported.   Cardiac Risk Factors include: advanced age (>68men, >19 women);hypertension;dyslipidemia;obesity (BMI >30kg/m2);smoking/ tobacco exposure;Other (see comment);sedentary lifestyle, Risk factor comments: OSA (cpap), prediabetic     Objective:    Today's Vitals   01/18/24 1351  Weight: 230 lb (104.3 kg)  Height: 5\' 3"  (1.6 m)  PainSc: 6    Body mass index is 40.74 kg/m.     01/18/2024    2:08 PM 01/06/2024   10:46 AM 01/04/2024    2:28 PM 04/01/2023   10:13 AM 03/31/2023    2:31 AM 01/12/2023    3:38 PM 04/14/2022    9:19 PM  Advanced Directives  Does Patient Have a Medical Advance Directive? No No No No No No No  Would patient like information on creating a medical advance directive? Yes (MAU/Ambulatory/Procedural Areas - Information given) No - Patient declined No - Patient declined  No - Patient declined No - Patient declined No - Patient declined    Current Medications (verified) Outpatient Encounter Medications as of 01/18/2024  Medication Sig   acetaminophen (TYLENOL) 500 MG tablet Take 1,000 mg by mouth in the morning and at  bedtime.   albuterol (PROAIR HFA) 108 (90 Base) MCG/ACT inhaler Inhale 2 puffs into the lungs every 6 (six) hours as needed for wheezing or shortness of breath.   albuterol (PROVENTIL) (2.5 MG/3ML) 0.083% nebulizer solution Take 3 mLs (2.5 mg total) by nebulization in the morning and at bedtime.   apixaban (ELIQUIS) 5 MG TABS tablet Take 1 tablet (5 mg total) by mouth 2 (two) times daily.   ascorbic acid (VITAMIN C) 500 MG tablet Take 500 mg by mouth daily.   atorvastatin (LIPITOR) 80 MG tablet TAKE 1 TABLET BY MOUTH EVERY DAY   baclofen (LIORESAL) 10 MG tablet Take 1 tablet (10 mg total) by mouth 3 (three) times daily.   busPIRone (BUSPAR) 10 MG tablet TAKE 2 TABLETS BY MOUTH 3 TIMES A DAY   Calcium Citrate-Vitamin D (CALCIUM + D PO) Take 1,000 mg by mouth daily.   Cholecalciferol (VITAMIN D) 50 MCG (2000 UT) CAPS Take by mouth.   clonazePAM (KLONOPIN) 0.5 MG tablet TAKE 1 TABLET BY MOUTH EVERY DAY AS NEEDED FOR ANXIETY   fluticasone (FLONASE) 50 MCG/ACT nasal spray Place 2 sprays into both nostrils daily.   furosemide (LASIX) 20 MG tablet Take 1 tablet (20 mg total) by mouth daily. (Patient taking differently: Take 20 mg by mouth every morning.)   gabapentin (NEURONTIN) 800 MG tablet Take 1 tablet (800 mg total) by mouth in the morning and at bedtime.   hydrOXYzine (ATARAX) 10 MG tablet TAKE 1 TABLET BY MOUTH THREE TIMES A DAY AS NEEDED   KLOR-CON M20 20 MEQ tablet TAKE 1  TABLET BY MOUTH EVERY DAY   Melatonin 10 MG TABS Take 1 tablet by mouth at bedtime.   Multiple Vitamins-Minerals (MULTIVITAMIN WITH MINERALS) tablet Take 1 tablet by mouth daily.   omeprazole (PRILOSEC) 40 MG capsule TAKE 1 CAPSULE (40 MG TOTAL) BY MOUTH DAILY.   sotalol (BETAPACE) 80 MG tablet Take 80 mg by mouth 2 (two) times daily.   Tiotropium Bromide-Olodaterol (STIOLTO RESPIMAT) 2.5-2.5 MCG/ACT AERS Inhale 2 puffs into the lungs daily.   venlafaxine XR (EFFEXOR-XR) 150 MG 24 hr capsule Take 1 capsule (150 mg total) by  mouth daily with breakfast.   vitamin B-12 (CYANOCOBALAMIN) 1000 MCG tablet Take 1,000 mcg by mouth daily.   Ferrous Sulfate Dried (SM SLOW RELEASE IRON) 143 (45 Fe) MG TBCR Take 1 tablet by mouth daily. (Patient not taking: Reported on 01/18/2024)   predniSONE (STERAPRED UNI-PAK 21 TAB) 10 MG (21) TBPK tablet Take as instructed on package (Patient not taking: Reported on 01/18/2024)   sodium chloride HYPERTONIC 3 % nebulizer solution Take by nebulization in the morning and at bedtime. (Patient not taking: Reported on 01/18/2024)   No facility-administered encounter medications on file as of 01/18/2024.    Allergies (verified) Iodinated contrast media   History: Past Medical History:  Diagnosis Date   Anxiety    Aortic atherosclerosis (HCC)    Arthritis    Atrial fibrillation with RVR (HCC)    a.) CHA2DS2VASc = 4 (age, sex, HTN, vascular disease history);  b.) rate/rhythm maintained on oral sotolol; chronically anticoagulated with apixaban   Atypical chest pain    Benign essential tremor    Chronic kidney disease, stage 3a (HCC)    COPD (chronic obstructive pulmonary disease) (HCC)    DDD (degenerative disc disease), cervical    a.) s/p ACDF C6-C7   DDD (degenerative disc disease), lumbar    Enlarged heart    Fatty liver    GERD (gastroesophageal reflux disease)    Hiatal hernia    Hyperlipidemia    Hypertension    Insomnia    a.) takes melatonin PRN   Long term current use of anticoagulant    a.) apixaban   Mass of right adrenal gland (HCC)    a.) CT AP 08/12/2022: measured 3.3 cm   Monocytosis    Morbid obesity (HCC)    Nicotine dependence    OSA on CPAP    Osteoporosis    Pancreatitis    Peripheral vascular disease (HCC)    Psoriasis    SOB (shortness of breath)    Umbilical hernia    Vitamin D deficiency    Past Surgical History:  Procedure Laterality Date   ABDOMINAL HYSTERECTOMY     ACHILLES TENDON REPAIR     Removed bone spur and repaired achilles tendon    ANTERIOR CERVICAL DECOMP/DISCECTOMY FUSION     APPENDECTOMY     COLONOSCOPY WITH PROPOFOL N/A 01/17/2019   Procedure: COLONOSCOPY WITH PROPOFOL;  Surgeon: Wyline Mood, MD;  Location: [facility] ENDOSCOPY;  Service: Gastroenterology;  Laterality: N/A;   FLEXIBLE BRONCHOSCOPY Bilateral 04/09/2023   Procedure: FLEXIBLE BRONCHOSCOPY;  Surgeon: Raechel Chute, MD;  Location: [facility] ORS;  Service: Pulmonary;  Laterality: Bilateral;   TONSILLECTOMY     Family History  Problem Relation Age of Onset   COPD Father    Heart disease Father    Hyperlipidemia Father    Hypertension Father    Dementia Father    Cancer Sister    Hyperlipidemia Sister    Hypertension Sister  Lymphoma Sister    Migraines Son    Stroke Maternal Grandfather    Heart attack Brother    Cancer Brother        colorectal and liver   Breast cancer Paternal Aunt    Breast cancer Paternal Aunt    Social History   Socioeconomic History   Marital status: Widowed    Spouse name: Not on file   Number of children: 1   Years of education: Not on file   Highest education level: GED or equivalent  Occupational History   Occupation: disabled  Tobacco Use   Smoking status: Every Day    Current packs/day: 1.00    Average packs/day: 1 pack/day for 53.1 years (53.1 ttl pk-yrs)    Types: Cigarettes    Start date: 49    Passive exposure: Past   Smokeless tobacco: Never  Vaping Use   Vaping status: Never Used  Substance and Sexual Activity   Alcohol use: No    Alcohol/week: 0.0 standard drinks of alcohol   Drug use: No   Sexual activity: Never    Partners: Male  Other Topics Concern   Not on file  Social History Narrative   Not on file   Social Drivers of Health   Financial Resource Strain: Low Risk  (01/18/2024)   Overall Financial Resource Strain (CARDIA)    Difficulty of Paying Living Expenses: Not hard at all  Food Insecurity: No Food Insecurity (01/18/2024)   Hunger Vital Sign    Worried About Running Out of Food  in the Last Year: Never true    Ran Out of Food in the Last Year: Never true  Transportation Needs: No Transportation Needs (01/18/2024)   PRAPARE - Administrator, Civil Service (Medical): No    Lack of Transportation (Non-Medical): No  Physical Activity: Inactive (01/18/2024)   Exercise Vital Sign    Days of Exercise per Week: 0 days    Minutes of Exercise per Session: 0 min  Stress: Stress Concern Present (01/18/2024)   [employer] of Occupational Health - Occupational Stress Questionnaire    Feeling of Stress : To some extent  Social Connections: Socially Isolated (01/18/2024)   Social Connection and Isolation Panel [NHANES]    Frequency of Communication with Friends and Family: Once a week    Frequency of Social Gatherings with Friends and Family: More than three times a week    Attends Religious Services: Never    Database administrator or Organizations: No    Attends Banker Meetings: Never    Marital Status: Widowed    Tobacco Counseling Ready to quit: Not Answered Counseling given: Not Answered   Clinical Intake:  Pre-visit preparation completed: Yes  Pain : 0-10 Pain Score: 6  Pain Type: Chronic pain Pain Location: Back Pain Descriptors / Indicators: Aching     BMI - recorded: 40.74 Nutritional Status: BMI > 30  Obese Nutritional Risks: None Diabetes: No  How often do you need to have someone help you when you read instructions, pamphlets, or other written materials from your doctor or pharmacy?: 1 - Never  Interpreter Needed?: No  Information entered by :: Tora Kindred, CMA   Activities of Daily Living    01/18/2024    1:55 PM 04/01/2023   10:05 AM  In your present state of health, do you have any difficulty performing the following activities:  Hearing? 0   Vision? 0   Difficulty concentrating or making decisions? 0  Walking or climbing stairs? 1   Comment uses cane or walker   Dressing or bathing? 0   Doing errands,  shopping? 1 0  Comment doesn't drive, son Biomedical scientist and eating ? Y   Comment son prepares food, can eat independently   Using the Toilet? N   In the past six months, have you accidently leaked urine? Y   Comment wears a pad   Do you have problems with loss of bowel control? N   Managing your Medications? N   Managing your Finances? N   Housekeeping or managing your Housekeeping? Y   Comment son does cleaning and cooking     Patient Care Team: Marjie Skiff, NP as PCP - General (Nurse Practitioner) Creig Hines, MD as Consulting Physician (Oncology)  Indicate any recent Medical Services you may have received from other than [facility] providers in the past year (date may be approximate).     Assessment:   This is a routine wellness examination for Amanat.  Hearing/Vision screen Hearing Screening - Comments:: Denies hearing loss Vision Screening - Comments:: Need eye exam, gave list of local eye doctors   Goals Addressed             This Visit's Progress    Patient Stated       Lose weight      Depression Screen    01/18/2024    2:05 PM 11/17/2023    3:08 PM 08/17/2023    2:47 PM 05/17/2023    2:48 PM 04/14/2023    3:47 PM 03/17/2023    3:31 PM 01/12/2023    3:36 PM  PHQ 2/9 Scores  PHQ - 2 Score 0 0 0 2 0 0 0  PHQ- 9 Score  0 0 4 2 0 0    Fall Risk    01/18/2024    2:10 PM 11/17/2023    3:08 PM 04/14/2023    3:47 PM 03/17/2023    3:32 PM 01/12/2023    3:39 PM  Fall Risk   Falls in the past year? 1 0 0  0  Number falls in past yr: 0 0 0  0  Injury with Fall? 0 0 0  0  Risk for fall due to : History of fall(s);Impaired balance/gait;Orthopedic patient;Impaired mobility No Fall Risks No Fall Risks Impaired mobility;Impaired balance/gait No Fall Risks  Follow up Falls prevention discussed;Falls evaluation completed;Education provided Falls evaluation completed Falls evaluation completed Falls evaluation completed Falls prevention discussed;Falls  evaluation completed    MEDICARE RISK AT HOME: Medicare Risk at Home Any stairs in or around the home?: Yes If so, are there any without handrails?: No Home free of loose throw rugs in walkways, pet beds, electrical cords, etc?: Yes Adequate lighting in your home to reduce risk of falls?: Yes Life alert?: No Use of a cane, walker or w/c?: Yes (walker) Grab bars in the bathroom?: Yes Shower chair or bench in shower?: Yes Elevated toilet seat or a handicapped toilet?: Yes  TIMED UP AND GO:  Was the test performed?  No    Cognitive Function:        01/18/2024    2:14 PM 01/12/2023    3:41 PM  6CIT Screen  What Year? 0 points 0 points  What month? 0 points 0 points  What time? 0 points 0 points  Count back from 20 0 points 0 points  Months in reverse 0 points 0 points  Repeat phrase 0 points  2 points  Total Score 0 points 2 points    Immunizations Immunization History  Administered Date(s) Administered   Fluad Quad(high Dose 65+) 09/03/2021, 12/02/2022   Fluad Trivalent(High Dose 65+) 08/17/2023   Influenza,inj,Quad PF,6+ Mos 10/28/2015, 08/25/2016, 09/07/2017, 09/28/2018, 09/04/2019, 09/03/2020   Influenza-Unspecified 08/27/2014   Moderna Sars-Covid-2 Vaccination 06/28/2020, 07/26/2020   Pneumococcal Conjugate-13 03/04/2021   Pneumococcal Polysaccharide-23 02/24/2019   Td 08/27/2014   Zoster, Live 12/20/2015    TDAP status: Up to date  Flu Vaccine status: Up to date  Pneumococcal vaccine status: Due, Education has been provided regarding the importance of this vaccine. Advised may receive this vaccine at local pharmacy or Health Dept. Aware to provide a copy of the vaccination record if obtained from local pharmacy or Health Dept. Verbalized acceptance and understanding.  Covid-19 vaccine status: Declined, Education has been provided regarding the importance of this vaccine but patient still declined. Advised may receive this vaccine at local pharmacy or Health  Dept.or vaccine clinic. Aware to provide a copy of the vaccination record if obtained from local pharmacy or Health Dept. Verbalized acceptance and understanding.  Qualifies for Shingles Vaccine? Yes   Zostavax completed Yes   Shingrix Completed?: No.    Education has been provided regarding the importance of this vaccine. Patient has been advised to call insurance company to determine out of pocket expense if they have not yet received this vaccine. Advised may also receive vaccine at local pharmacy or Health Dept. Verbalized acceptance and understanding.  Screening Tests Health Maintenance  Topic Date Due   Zoster Vaccines- Shingrix (1 of 2) 12/15/1974   Colonoscopy  01/18/2024   Pneumonia Vaccine 46+ Years old (3 of 3 - PPSV23 or PCV20) 02/24/2024   MAMMOGRAM  01/27/2024   Lung Cancer Screening  06/06/2024   DTaP/Tdap/Td (2 - Tdap) 08/27/2024   Medicare Annual Wellness (AWV)  01/17/2025   DEXA SCAN  01/26/2027   INFLUENZA VACCINE  Completed   Hepatitis C Screening  Completed   HPV VACCINES  Aged Out   COVID-19 Vaccine  Discontinued    Health Maintenance  Health Maintenance Due  Topic Date Due   Zoster Vaccines- Shingrix (1 of 2) 12/15/1974   Colonoscopy  01/18/2024   Pneumonia Vaccine 30+ Years old (3 of 3 - PPSV23 or PCV20) 02/24/2024    Colorectal cancer screening: Type of screening: Colonoscopy. Completed 01/17/19. Repeat every 5 years. Patient waiting for appointment with GI  Mammogram status: Ordered 01/18/24. Pt provided with contact info and advised to call to schedule appt.   Bone Density status: Completed 01/26/22. Results reflect: Bone density results: OSTEOPENIA. Repeat every 5 years.  Lung Cancer Screening: (Low Dose CT Chest recommended if Age 57-80 years, 20 pack-year currently smoking OR have quit w/in 15years.) does qualify. Last done 06/07/23  Lung Cancer Screening Referral: ordered 06/14/23 for 2025  Additional Screening:  Hepatitis C Screening: does not  qualify; Completed 07/26/15  Vision Screening: Recommended annual ophthalmology exams for early detection of glaucoma and other disorders of the eye.  Dental Screening: Recommended annual dental exams for proper oral hygiene    Community Resource Referral / Chronic Care Management: CRR required this visit?  No   CCM required this visit?  No     Plan:     I have personally reviewed and noted the following in the patient's chart:   Medical and social history Use of alcohol, tobacco or illicit drugs  Current medications and supplements including opioid prescriptions. Patient is not currently  taking opioid prescriptions. Functional ability and status Nutritional status Physical activity Advanced directives List of other physicians Hospitalizations, surgeries, and ER visits in previous 12 months Vitals Screenings to include cognitive, depression, and falls Referrals and appointments  In addition, I have reviewed and discussed with patient certain preventive protocols, quality metrics, and best practice recommendations. A written personalized care plan for preventive services as well as general preventive health recommendations were provided to patient.     Tora Kindred, CMA   01/18/2024   After Visit Summary: (MyChart) Due to this being a telephonic visit, the after visit summary with patients personalized plan was offered to patient via MyChart   Nurse Notes:  Needs pneumonia and shingles vaccines Declined covid vaccine Tdap due 07/2024 Placed order for a MMG Patient is waiting for an appointment with GI for her IDA, liver and colonoscopy

## 2024-01-18 NOTE — Patient Instructions (Addendum)
[name] , Thank you for taking time to come for your Medicare Wellness Visit. I appreciate your ongoing commitment to your health goals. Please review the following plan we discussed and let me know if I can assist you in the future.   Referrals/Orders/Follow-Ups/Clinician Recommendations: You will be due for a tetanus shot 07/2024. I have placed an order for a mammogram. Call [facility] @ [phone] to schedule at your convenience. You need another pneumonia vaccine and a shingles vaccine. Call to schedule an eye exam at your earliest convenience. I have included a list of eye doctors in the area.  This is a list of the screening recommended for you and due dates:  Health Maintenance  Topic Date Due   Zoster (Shingles) Vaccine (1 of 2) 12/15/1974   Mammogram  01/26/2023   Colon Cancer Screening  01/18/2024   Pneumonia Vaccine (3 of 3 - PPSV23 or PCV20) 02/24/2024   Screening for Lung Cancer  06/06/2024   DTaP/Tdap/Td vaccine (2 - Tdap) 08/27/2024   Medicare Annual Wellness Visit  01/17/2025   DEXA scan (bone density measurement)  01/26/2027   Flu Shot  Completed   Hepatitis C Screening  Completed   HPV Vaccine  Aged Out   COVID-19 Vaccine  Discontinued    Advanced directives: (ACP Link)Information on Advanced Care Planning can be found at [facility] Advance Health Care Directives Advance Health Care Directives ([email or web address]  You may also get the forms at [facility].  Once you have completed the forms, please bring a copy of your health care power of attorney and living will to the office to be added to your chart at your convenience.   Next Medicare Annual Wellness Visit scheduled for next year: Yes, 01/23/25 @ 1:50pm (phone visit)  Fall Prevention in the Home, Adult Falls can cause injuries and affect people of all ages. There are many simple things that you can do to make your home safe and to help prevent falls. If you need it, ask  for help making these changes. What actions can I take to prevent falls? General information Use good lighting in all rooms. Make sure to: Replace any light bulbs that burn out. Turn on lights if it is dark and use night-lights. Keep items that you use often in easy-to-reach places. Lower the shelves around your home if needed. Move furniture so that there are clear paths around it. Do not keep throw rugs or other things on the floor that can make you trip. If any of your floors are uneven, fix them. Add color or contrast paint or tape to clearly mark and help you see: Grab bars or handrails. First and last steps of staircases. Where the edge of each step is. If you use a ladder or stepladder: Make sure that it is fully opened. Do not climb a closed ladder. Make sure the sides of the ladder are locked in place. Have someone hold the ladder while you use it. Know where your pets are as you move through your home. What can I do in the bathroom?     Keep the floor dry. Clean up any water that is on the floor right away. Remove soap buildup in the bathtub or shower. Buildup makes bathtubs and showers slippery. Use non-skid mats or decals on the floor of the bathtub or shower. Attach bath mats securely with double-sided, non-slip rug tape. If you need to sit down while you are in the shower, use  a non-slip stool. Install grab bars by the toilet and in the bathtub and shower. Do not use towel bars as grab bars. What can I do in the bedroom? Make sure that you have a light by your bed that is easy to reach. Do not use any sheets or blankets on your bed that hang to the floor. Have a firm bench or chair with side arms that you can use for support when you get dressed. What can I do in the kitchen? Clean up any spills right away. If you need to reach something above you, use a sturdy step stool that has a grab bar. Keep electrical cables out of the way. Do not use floor polish or wax that  makes floors slippery. What can I do with my stairs? Do not leave anything on the stairs. Make sure that you have a light switch at the top and the bottom of the stairs. Have them installed if you do not have them. Make sure that there are handrails on both sides of the stairs. Fix handrails that are broken or loose. Make sure that handrails are as long as the staircases. Install non-slip stair treads on all stairs in your home if they do not have carpet. Avoid having throw rugs at the top or bottom of stairs, or secure the rugs with carpet tape to prevent them from moving. Choose a carpet design that does not hide the edge of steps on the stairs. Make sure that carpet is firmly attached to the stairs. Fix any carpet that is loose or worn. What can I do on the outside of my home? Use bright outdoor lighting. Repair the edges of walkways and driveways and fix any cracks. Clear paths of anything that can make you trip, such as tools or rocks. Add color or contrast paint or tape to clearly mark and help you see high doorway thresholds. Trim any bushes or trees on the main path into your home. Check that handrails are securely fastened and in good repair. Both sides of all steps should have handrails. Install guardrails along the edges of any raised decks or porches. Have leaves, snow, and ice cleared regularly. Use sand, salt, or ice melt on walkways during winter months if you live where there is ice and snow. In the garage, clean up any spills right away, including grease or oil spills. What other actions can I take? Review your medicines with your health care provider. Some medicines can make you confused or feel dizzy. This can increase your chance of falling. Wear closed-toe shoes that fit well and support your feet. Wear shoes that have rubber soles and low heels. Use a cane, walker, scooter, or crutches that help you move around if needed. Talk with your provider about other ways that you  can decrease your risk of falls. This may include seeing a physical therapist to learn to do exercises to improve movement and strength. Where to find more information Centers for Disease Control and Prevention, STEADI: TonerPromos.no [employer] on Aging: BaseRingTones.pl National Institute on Aging: BaseRingTones.pl Contact a health care provider if: You are afraid of falling at home. You feel weak, drowsy, or dizzy at home. You fall at home. Get help right away if you: Lose consciousness or have trouble moving after a fall. Have a fall that causes a head injury. These symptoms may be an emergency. Get help right away. Call 911. Do not wait to see if the symptoms will go away. Do not  drive yourself to the hospital. This information is not intended to replace advice given to you by your health care provider. Make sure you discuss any questions you have with your health care provider. Document Revised: 07/20/2022 Document Reviewed: 07/20/2022 Elsevier Patient Education  2024 [employer].  There are several [employer] in your area. Here are a few that usually accept all insurance types:  [facility] [address] [place], [place] [address] Phone: [phone]  [facility] [address] [place], [place] [address] Phone: [phone]  [facility] [address] [place], [place] [address] Phone: [phone]  [facility] [address] [place], [place] [address] Phone: [phone]  [facility] [address] [place], [place] [address] Phone: [phone]  [facility] [address] [place], [place] [address] Phone: [phone]  Please let us know if you require a referral for an eye exam appointment. Thank you!

## 2024-01-20 NOTE — Telephone Encounter (Signed)
 Received incoming referral for the patient, try to call and schedule an appointment. The patient didn't answer so I left a voicemail to call back to schedule appointment.

## 2024-01-24 NOTE — Telephone Encounter (Signed)
 The patient called and left a voicemail requesting to schedule appointment. I called him back to let him know that we received his message and no one answered I left a voicemail to call us.

## 2024-01-26 NOTE — Telephone Encounter (Signed)
 The patient called in to see if the schedule was open and I scheduled her with Celso Amy.

## 2024-02-03 DIAGNOSIS — J439 Emphysema, unspecified: Secondary | ICD-10-CM | POA: Diagnosis not present

## 2024-02-03 DIAGNOSIS — G4733 Obstructive sleep apnea (adult) (pediatric): Secondary | ICD-10-CM | POA: Diagnosis not present

## 2024-02-07 ENCOUNTER — Encounter: Payer: Self-pay | Admitting: Physician Assistant

## 2024-02-07 ENCOUNTER — Ambulatory Visit: Payer: PPO | Admitting: Physician Assistant

## 2024-02-07 VITALS — BP 168/63 | HR 64 | Temp 98.1°F | Ht 63.0 in | Wt 233.2 lb

## 2024-02-07 DIAGNOSIS — R748 Abnormal levels of other serum enzymes: Secondary | ICD-10-CM

## 2024-02-07 DIAGNOSIS — K8020 Calculus of gallbladder without cholecystitis without obstruction: Secondary | ICD-10-CM

## 2024-02-07 DIAGNOSIS — K7460 Unspecified cirrhosis of liver: Secondary | ICD-10-CM | POA: Diagnosis not present

## 2024-02-07 DIAGNOSIS — K760 Fatty (change of) liver, not elsewhere classified: Secondary | ICD-10-CM

## 2024-02-07 DIAGNOSIS — E538 Deficiency of other specified B group vitamins: Secondary | ICD-10-CM

## 2024-02-07 DIAGNOSIS — K529 Noninfective gastroenteritis and colitis, unspecified: Secondary | ICD-10-CM | POA: Diagnosis not present

## 2024-02-07 DIAGNOSIS — D509 Iron deficiency anemia, unspecified: Secondary | ICD-10-CM | POA: Diagnosis not present

## 2024-02-07 DIAGNOSIS — Z860101 Personal history of adenomatous and serrated colon polyps: Secondary | ICD-10-CM

## 2024-02-07 DIAGNOSIS — Z860100 Personal history of colon polyps, unspecified: Secondary | ICD-10-CM

## 2024-02-07 DIAGNOSIS — R1011 Right upper quadrant pain: Secondary | ICD-10-CM

## 2024-02-07 DIAGNOSIS — R197 Diarrhea, unspecified: Secondary | ICD-10-CM

## 2024-02-07 MED ORDER — PEG 3350-KCL-NA BICARB-NACL 420 G PO SOLR: 4000.0000 mL | Freq: Once | ORAL | 0 refills | Status: AC

## 2024-02-07 NOTE — Progress Notes (Signed)
 Celso Amy, PA-C [address]  [address]  [place], [place] [address]  Main: [phone]  Fax: [phone]   Gastroenterology Consultation  Referring Provider:     Marjie Skiff, NP Primary Care Physician:  Kathleen Skiff, NP Primary Gastroenterologist:  Celso Amy, PA-C / Dr. Wyline Mood   Reason for Consultation:     Iron deficiency anemia        HPI:   [name] is a 68 y.o. y/o female referred for consultation & management  by Kathleen Skiff, NP.  Referred for iron deficiency anemia.  History of colon polyps and is due for repeat colonoscopy.  Never had EGD.    Current symptoms: Patient has moderate COPD and had recent upper respiratory infection.  She has chronic cough and shortness of breath.  Walks with a walker, obese, deconditioned.  She has chronic loose stools with 1-4 loose and watery bowel movements daily.  Denies constipation, melena, hematochezia.  Has mild intermittent nausea but no vomiting.  GERD is controlled on PPI.  She has had episodes of RUQ abdominal pain for several months.  Sore to touch.  Not affected by eating.  12/20/2023 RUQ ultrasound showed nodular liver concerning for cirrhosis and hepatic steatosis with increased hepatic echogenicity.  1.5 cm gallstone.  Normal common bile duct 5 mm..  No liver masses.  She has not drank any alcohol since age 71s.  01/04/24 labs: Hemoglobin 9.8, MCV 83, ferritin 13, total iron 27, platelet 341. 08/2023 labs: Hemoglobin 11.2. Patient's alkaline phosphatase has been chronically elevated since 2023 ranging between 217 - 269.  GGT has also been elevated.  Total bilirubin, AST and ALT liver transaminases have been normal.  12/2018 colonoscopy by Dr. Tobi Bastos: 3 small tubular adenoma polyps (3 mm to 6 mm) removed.  Sigmoid diverticulosis.  Good prep.  5-year repeat recommended.  Medical history significant for hiatal hernia, GERD, fatty liver, PVD, atrial fibrillation, COPD.  Takes Prilosec 40 mg daily,  vitamin B12, iron tablet, and Eliquis daily.  She had IV iron infusion a month ago.  She is not on oxygen.  Past Medical History:  Diagnosis Date   Anxiety    Aortic atherosclerosis (HCC)    Arthritis    Atrial fibrillation with RVR (HCC)    a.) CHA2DS2VASc = 4 (age, sex, HTN, vascular disease history);  b.) rate/rhythm maintained on oral sotolol; chronically anticoagulated with apixaban   Atypical chest pain    Benign essential tremor    Chronic kidney disease, stage 3a (HCC)    COPD (chronic obstructive pulmonary disease) (HCC)    DDD (degenerative disc disease), cervical    a.) s/p ACDF C6-C7   DDD (degenerative disc disease), lumbar    Enlarged heart    Fatty liver    GERD (gastroesophageal reflux disease)    Hiatal hernia    Hyperlipidemia    Hypertension    Insomnia    a.) takes melatonin PRN   Long term current use of anticoagulant    a.) apixaban   Mass of right adrenal gland (HCC)    a.) CT AP 08/12/2022: measured 3.3 cm   Monocytosis    Morbid obesity (HCC)    Nicotine dependence    OSA on CPAP    Osteoporosis    Pancreatitis    Peripheral vascular disease (HCC)    Psoriasis    SOB (shortness of breath)    Umbilical hernia    Vitamin D deficiency  Past Surgical History:  Procedure Laterality Date   ABDOMINAL HYSTERECTOMY     ACHILLES TENDON REPAIR     Removed bone spur and repaired achilles tendon   ANTERIOR CERVICAL DECOMP/DISCECTOMY FUSION     APPENDECTOMY     COLONOSCOPY WITH PROPOFOL N/A 01/17/2019   Procedure: COLONOSCOPY WITH PROPOFOL;  Surgeon: Wyline Mood, MD;  Location: [facility] END[facility]COPY;  Service: Gastroenterology;  Laterality: N/A;   FLEXIBLE BRONCHOSCOPY Bilateral 04/09/2023   Procedure: FLEXIBLE BRONCHOSCOPY;  Surgeon: Raechel Chute, MD;  Location: [facility] ORS;  Service: Pulmonary;  Laterality: Bilateral;   TONSILLECTOMY      Prior to Admission medications   Medication Sig Start Date End Date Taking? Authorizing Provider  acetaminophen  (TYLENOL) 500 MG tablet Take 1,000 mg by mouth in the morning and at bedtime.    [provider]  albuterol (PROAIR HFA) 108 (90 Base) MCG/ACT inhaler Inhale 2 puffs into the lungs every 6 (six) hours as needed for wheezing or shortness of breath. 12/02/22   Cannady, Corrie Dandy T, NP  albuterol (PROVENTIL) (2.5 MG/3ML) 0.083% nebulizer solution Take 3 mLs (2.5 mg total) by nebulization in the morning and at bedtime. 08/30/23   Raechel Chute, MD  apixaban (ELIQUIS) 5 MG TABS tablet Take 1 tablet (5 mg total) by mouth 2 (two) times daily. 04/17/22   Enedina Finner, MD  ascorbic acid (VITAMIN C) 500 MG tablet Take 500 mg by mouth daily.    [provider]  atorvastatin (LIPITOR) 80 MG tablet TAKE 1 TABLET BY MOUTH EVERY DAY 12/20/23   Cannady, Corrie Dandy T, NP  baclofen (LIORESAL) 10 MG tablet Take 1 tablet (10 mg total) by mouth 3 (three) times daily. 11/17/23   Cannady, Corrie Dandy T, NP  busPIRone (BUSPAR) 10 MG tablet TAKE 2 TABLETS BY MOUTH 3 TIMES A DAY 12/07/23   Cannady, Corrie Dandy T, NP  Calcium Citrate-Vitamin D (CALCIUM + D PO) Take 1,000 mg by mouth daily.    [provider]  Cholecalciferol (VITAMIN D) 50 MCG (2000 UT) CAPS Take by mouth.    [provider]  clonazePAM (KLONOPIN) 0.5 MG tablet TAKE 1 TABLET BY MOUTH EVERY DAY AS NEEDED FOR ANXIETY 11/19/23   Cannady, Jolene T, NP  Ferrous Sulfate Dried (SM SLOW RELEASE IRON) 143 (45 Fe) MG TBCR Take 1 tablet by mouth daily. Patient not taking: Reported on 01/18/2024 12/27/23   [provider]  fluticasone (FLONASE) 50 MCG/ACT nasal spray Place 2 sprays into both nostrils daily. 08/17/23   Cannady, Corrie Dandy T, NP  furosemide (LASIX) 20 MG tablet Take 1 tablet (20 mg total) by mouth daily. Patient taking differently: Take 20 mg by mouth every morning. 03/17/23   Cannady, Corrie Dandy T, NP  gabapentin (NEURONTIN) 800 MG tablet Take 1 tablet (800 mg total) by mouth in the morning and at bedtime. 08/17/23   Cannady, Corrie Dandy T, NP   hydrOXYzine (ATARAX) 10 MG tablet TAKE 1 TABLET BY MOUTH THREE TIMES A DAY AS NEEDED 12/17/23   Cannady, Jolene T, NP  KLOR-CON M20 20 MEQ tablet TAKE 1 TABLET BY MOUTH EVERY DAY 12/16/23   Cannady, Corrie Dandy T, NP  Melatonin 10 MG TABS Take 1 tablet by mouth at bedtime.    [provider]  Multiple Vitamins-Minerals (MULTIVITAMIN WITH MINERALS) tablet Take 1 tablet by mouth daily.    [provider]  omeprazole (PRILOSEC) 40 MG capsule TAKE 1 CAPSULE (40 MG TOTAL) BY MOUTH DAILY. 12/17/23   Cannady, Corrie Dandy T, NP  predniSONE (STERAPRED UNI-PAK 21 TAB)  10 MG (21) TBPK tablet Take as instructed on package Patient not taking: Reported on 01/18/2024 11/17/23   Aura Dials T, NP  sodium chloride HYPERTONIC 3 % nebulizer solution Take by nebulization in the morning and at bedtime. Patient not taking: Reported on 01/18/2024 08/30/23   Raechel Chute, MD  sotalol (BETAPACE) 80 MG tablet Take 80 mg by mouth 2 (two) times daily. 08/21/22   [provider]  Tiotropium Bromide-Olodaterol (STIOLTO RESPIMAT) 2.5-2.5 MCG/ACT AERS Inhale 2 puffs into the lungs daily. 08/30/23 08/29/24  Raechel Chute, MD  venlafaxine XR (EFFEXOR-XR) 150 MG 24 hr capsule Take 1 capsule (150 mg total) by mouth daily with breakfast. 03/17/23   Cannady, Corrie Dandy T, NP  vitamin B-12 (CYANOCOBALAMIN) 1000 MCG tablet Take 1,000 mcg by mouth daily.    [provider]    Family History  Problem Relation Age of Onset   COPD Father    Heart disease Father    Hyperlipidemia Father    Hypertension Father    Dementia Father    Cancer Sister    Hyperlipidemia Sister    Hypertension Sister    Lymphoma Sister    Migraines Son    Stroke Maternal Grandfather    Heart attack Brother    Cancer Brother        colorectal and liver   Breast cancer Paternal Aunt    Breast cancer Paternal Aunt      Social History   Tobacco Use   Smoking status: Every Day    Current packs/day: 1.00    Average packs/day: 1  pack/day for 53.2 years (53.2 ttl pk-yrs)    Types: Cigarettes    Start date: 1972    Passive exposure: Past   Smokeless tobacco: Never  Vaping Use   Vaping status: Never Used  Substance Use Topics   Alcohol use: No    Alcohol/week: 0.0 standard drinks of alcohol   Drug use: No    Allergies as of 02/07/2024 - Review Complete 02/07/2024  Allergen Reaction Noted   Iodinated contrast media Rash 05/25/2022    Review of Systems:    All systems reviewed and negative except where noted in HPI.   Physical Exam:  BP (!) 168/63   Pulse 64   Temp 98.1 F (36.7 C)   Ht 5\' 3"  (1.6 m)   Wt 233 lb 3.2 oz (105.8 kg)   LMP  (LMP Unknown)   BMI 41.31 kg/m  No LMP recorded (lmp unknown). Patient has had a hysterectomy.  General:   Alert, obese deconditioned elderly female walking with a walker, pleasant and cooperative in NAD.   Lungs:  Appears to be short of breath at rest and with any exertion.  Respirations even and labored.  No wheezes, crackles, or rhonchi.  Heart:  Regular rate and rhythm; no murmurs, clicks, rubs, or gallops. Abdomen:  Normal bowel sounds.  No bruits.  Soft, and very obese without masses.  Very large umbilical versus ventral lower abdominal hernia which is soft not tender.  Mild RUQ tenderness.  Mild hepatomegaly.  Rest of abdomen is not tender.  No guarding or rebound tenderness.    Neurologic:  Alert and oriented x3; weak lower extremities.  Walks with a walker. Psych:  Alert and cooperative. Normal mood and affect.  Imaging Studies: No results found.  Assessment and Plan:   TEXANNA HILBURN is a 68 y.o. y/o female has been referred for:  1.  Iron deficiency anemia / B12 deficiency  Scheduling EGD  I discussed risks of EGD with patient to include risk of bleeding, perforation, and risk of sedation.  Patient expressed understanding and agrees to proceed with EGD.   Continue iron and B12 supplements.  2.  History of adenomatous colon polyps  Scheduling  Colonoscopy I discussed risks of colonoscopy with patient to include risk of bleeding, colon perforation, and risk of sedation.  Patient expressed understanding and agrees to proceed with colonoscopy.   3.  MASLD / Cirrhosis / Fatty Liver  Labs: CBC, CMP, PT/INR  Recommend low-fat diet.  Exercise is difficult given her current health status.  4.  Elevated alkaline phosphatase  Labs: AMA, ANA, ASMA, GGT  5.  RUQ Pain / Gallstone  Abd MRI / MRCP  6.  Chronic diarrhea  Lab: Celiac panel  7.  COPD / SOB  Request pulmonary clearance for EGD and colonoscopy procedures.  8.  History of A-fib on Eliquis  Request permission to hold Eliquis prior to procedures.  Follow up in 2 months with Dr. Tobi Bastos.  Celso Amy, PA-C

## 2024-02-07 NOTE — Patient Instructions (Addendum)
 MR Abdomen scheduled 02-14-24 2 [facility] arrive at 9:45  am   Clearance sent to Dr Aundria Rud Pulmonary and [facility] Cardiologist.

## 2024-02-08 ENCOUNTER — Telehealth: Payer: Self-pay

## 2024-02-08 ENCOUNTER — Encounter: Payer: Self-pay | Admitting: Physician Assistant

## 2024-02-08 ENCOUNTER — Encounter: Payer: Self-pay | Admitting: Oncology

## 2024-02-08 ENCOUNTER — Telehealth: Payer: Self-pay | Admitting: Physician Assistant

## 2024-02-08 LAB — CBC WITH DIFFERENTIAL/PLATELET
Basophils Absolute: 0 10*3/uL (ref 0.0–0.2)
Basos: 1 %
EOS (ABSOLUTE): 0.2 10*3/uL (ref 0.0–0.4)
Eos: 2 %
Hematocrit: 38.9 % (ref 34.0–46.6)
Hemoglobin: 12 g/dL (ref 11.1–15.9)
Immature Grans (Abs): 0 10*3/uL (ref 0.0–0.1)
Immature Granulocytes: 0 %
Lymphocytes Absolute: 3.7 10*3/uL — ABNORMAL HIGH (ref 0.7–3.1)
Lymphs: 43 %
MCH: 26.8 pg (ref 26.6–33.0)
MCHC: 30.8 g/dL — ABNORMAL LOW (ref 31.5–35.7)
MCV: 87 fL (ref 79–97)
Monocytes Absolute: 0.9 10*3/uL (ref 0.1–0.9)
Monocytes: 10 %
Neutrophils Absolute: 3.9 10*3/uL (ref 1.4–7.0)
Neutrophils: 44 %
Platelets: 285 10*3/uL (ref 150–450)
RBC: 4.47 x10E6/uL (ref 3.77–5.28)
RDW: 19.3 % — ABNORMAL HIGH (ref 11.7–15.4)
WBC: 8.8 10*3/uL (ref 3.4–10.8)

## 2024-02-08 LAB — COMPREHENSIVE METABOLIC PANEL
ALT: 19 IU/L (ref 0–32)
AST: 30 IU/L (ref 0–40)
Albumin: 3.6 g/dL — ABNORMAL LOW (ref 3.9–4.9)
Alkaline Phosphatase: 188 IU/L — ABNORMAL HIGH (ref 44–121)
BUN/Creatinine Ratio: 15 (ref 12–28)
BUN: 14 mg/dL (ref 8–27)
Bilirubin Total: <0.2 mg/dL (ref 0.0–1.2)
CO2: 24 mmol/L (ref 20–29)
Calcium: 9.5 mg/dL (ref 8.7–10.3)
Chloride: 108 mmol/L — ABNORMAL HIGH (ref 96–106)
Creatinine, Ser: 0.93 mg/dL (ref 0.57–1.00)
Globulin, Total: 3.1 g/dL (ref 1.5–4.5)
Glucose: 111 mg/dL — ABNORMAL HIGH (ref 70–99)
Potassium: 4.8 mmol/L (ref 3.5–5.2)
Sodium: 143 mmol/L (ref 134–144)
Total Protein: 6.7 g/dL (ref 6.0–8.5)
eGFR: 67 mL/min/{1.73_m2} (ref >59)

## 2024-02-08 LAB — IRON,TIBC AND FERRITIN PANEL
Ferritin: 207 ng/mL — ABNORMAL HIGH (ref 15–150)
Iron Saturation: 18 % (ref 15–55)
Iron: 50 ug/dL (ref 27–139)
Total Iron Binding Capacity: 276 ug/dL (ref 250–450)
UIBC: 226 ug/dL (ref 118–369)

## 2024-02-08 LAB — CELIAC DISEASE AB SCREEN W/RFX
Antigliadin Abs, IgA: 5 U (ref 0–19)
IgA/Immunoglobulin A, Serum: 188 mg/dL (ref 87–352)
Transglutaminase IgA: <2 U/mL (ref 0–3)

## 2024-02-08 LAB — PROTIME-INR
INR: 1 (ref 0.9–1.2)
Prothrombin Time: 11.4 s (ref 9.1–12.0)

## 2024-02-08 LAB — GAMMA GT: GGT: 278 IU/L — ABNORMAL HIGH (ref 0–60)

## 2024-02-08 LAB — MITOCHONDRIAL/SMOOTH MUSCLE AB PNL
Mitochondrial Ab: 200.9 U — ABNORMAL HIGH (ref 0.0–20.0)
Smooth Muscle Ab: 23 U — ABNORMAL HIGH (ref 0–19)

## 2024-02-08 LAB — VITAMIN B12: Vitamin B-12: 1729 pg/mL — ABNORMAL HIGH (ref 232–1245)

## 2024-02-08 LAB — ANA: Anti Nuclear Antibody (ANA): POSITIVE — AB

## 2024-02-08 NOTE — Telephone Encounter (Signed)
 Copied from CRM [id number]. Topic: Clinical - Medication Question >> Feb 08, 2024  3:24 PM Carlatta H wrote: Reason for CRM: Patient has a MRI scheduled and she is very claustrophobic//She would like to know if she can increase her clonazePAM (KLONOPIN) 0.5 MG tablet [[id number]] for the procedure or have a prescription call in//

## 2024-02-08 NOTE — Telephone Encounter (Signed)
 Left message on VM to return call to office.   Call and notify patient labs show:  1.  Normal white count and hemoglobin.  Iron deficiency anemia has improved.  Hemoglobin has improved from 9.8 up to 12.0 in the past 5 weeks.  This is great news.  Continue with plan for EGD and colonoscopy with Dr. Tobi Bastos 03/14/24.  2.  Alkaline phosphatase and GGT liver test are elevated.  Positive ANA, AMA, and ASMA.  This is concerning for possible primary biliary cirrhosis.  Continue with plan for abdominal MRI as scheduled.  **Please schedule follow-up appointment with Dr. Tobi Bastos in May 2025 for follow-up of LFTs.  3.  Iron panel is normal.  No evidence of iron deficiency or iron overload.  Vitamin B12 is high.  She can decrease her vitamin B12 supplement.  4.  Still waiting on celiac lab results.  Celso Amy, PA-C

## 2024-02-08 NOTE — Progress Notes (Signed)
 Call and notify patient labs show: 1.  Normal white count and hemoglobin.  Iron deficiency anemia has improved.  Hemoglobin has improved from 9.8 up to 12.0 in the past 5 weeks.  This is great news. Continue with plan for EGD and colonoscopy with Dr. Tobi Bastos 03/14/24. 2.  Alkaline phosphatase and GGT liver test are elevated.  Positive ANA, AMA, and ASMA.  This is concerning for possible primary biliary cirrhosis.  Continue with plan for abdominal MRI as scheduled.  **Please schedule follow-up appointment with Dr. Tobi Bastos in May 2025 for follow-up of LFTs. 3.  Iron panel is normal.  No evidence of iron deficiency or iron overload.  Vitamin B12 is high.  She can decrease her vitamin B12 supplement. 4.  Still waiting on celiac lab results. Celso Amy, PA-C

## 2024-02-08 NOTE — Telephone Encounter (Signed)
Called and notified patient of Jolene's message. Patient verbalized understanding.

## 2024-02-08 NOTE — Telephone Encounter (Signed)
 Spoke with patient- she is requesting something for anxiety-I let her know to check with the pharmacy later today. Call and notify patient labs show:  1.  Normal white count and hemoglobin.  Iron deficiency anemia has improved.  Hemoglobin has improved from 9.8 up to 12.0 in the past 5 weeks.  This is great news.  Continue with plan for EGD and colonoscopy with Dr. Tobi Bastos 03/14/24.  2.  Alkaline phosphatase and GGT liver test are elevated.  Positive ANA, AMA, and ASMA.  This is concerning for possible primary biliary cirrhosis.  Continue with plan for abdominal MRI as scheduled.  **Please schedule follow-up appointment with Dr. Tobi Bastos in May 2025 for follow-up of LFTs.  3.  Iron panel is normal.  No evidence of iron deficiency or iron overload.  Vitamin B12 is high.  She can decrease her vitamin B12 supplement.  4.  Still waiting on celiac lab results.  Celso Amy, PA-C

## 2024-02-08 NOTE — Telephone Encounter (Signed)
 Error

## 2024-02-09 ENCOUNTER — Ambulatory Visit
Admission: RE | Admit: 2024-02-09 | Discharge: 2024-02-09 | Disposition: A | Discharge status: Home / Self Care | Source: Ambulatory Visit | Attending: Student in an Organized Health Care Education/Training Program | Admitting: Student in an Organized Health Care Education/Training Program

## 2024-02-09 ENCOUNTER — Ambulatory Visit: Admitting: Student in an Organized Health Care Education/Training Program

## 2024-02-09 ENCOUNTER — Encounter: Payer: Self-pay | Admitting: Student in an Organized Health Care Education/Training Program

## 2024-02-09 VITALS — BP 128/70 | HR 71 | Temp 98.2°F | Ht 63.0 in | Wt 236.4 lb

## 2024-02-09 DIAGNOSIS — R0602 Shortness of breath: Secondary | ICD-10-CM

## 2024-02-09 DIAGNOSIS — J439 Emphysema, unspecified: Secondary | ICD-10-CM

## 2024-02-09 DIAGNOSIS — F17210 Nicotine dependence, cigarettes, uncomplicated: Secondary | ICD-10-CM | POA: Diagnosis not present

## 2024-02-09 DIAGNOSIS — J479 Bronchiectasis, uncomplicated: Secondary | ICD-10-CM | POA: Diagnosis not present

## 2024-02-09 DIAGNOSIS — R059 Cough, unspecified: Secondary | ICD-10-CM | POA: Diagnosis not present

## 2024-02-10 NOTE — Progress Notes (Signed)
 Assessment & Plan:   #COPD Gold 2B - Emphysema #Bronchiectasis  PFT's consistent with PRiSM, with FEV1/FVC being at 74%, while FEV1 being at 74% predicted. The flow volume loop does suggest an obstructive pattern consistent with her history of smoking and with emphysema noted on her chest CT. Given this, we initiated LABA/LAMA therapy during our last visit that she is tolerating well (Stiolto Respimat).  There is also signs of bronchiectasis in the lingula, in the same area of consolidation previously noted on imaging. Suspect this is a sequelae of the infection, not the cause of it. I had prescribed a pulmonary clearance regimen during our last visit that I encouraged the patient to continue using.  -pulmonary clearance regimen -continue with Stiolto Respimat -smoking cessation recommended  #Pulmonary Infiltrate  Initially presented for the evaluation of a lingular infiltrate with mass like consolidation, tree-in-bud nodularity, as well as bronchial wall thickening. Bronchoscopy with BAL grew H. Influenza, and the patient received a course of Cefpodoxime for treatment, with improvement. This was resolved on the most recent CT from July of 2024. She continues with yearly LDCT for lung cancer screening and I will review her next CT on follow up.  #Preoperative Risk Assessment  Presenting for preoperative risk assessment and optimization prior to EGD. She is currently optimized from a pulmonary perspective to undergo this low risk procedure (EGD). She does have a history of OSA and is maintained on CPAP; would consider brief use of CPAP post procedure if she is somnolent.  - DG Chest 2 View > reviewed, no infiltrates - smoking cessation recommended   Return in about 6 months (around 08/11/2024).  I spent 30 minutes caring for this patient today, including preparing to see the patient, obtaining a medical history , reviewing a separately obtained history, performing a medically appropriate  examination and/or evaluation, counseling and educating the patient/family/caregiver, ordering medications, tests, or procedures, and documenting clinical information in the electronic health record  Kathleen Chute, MD [facility]   End of visit medications:  No orders of the defined types were placed in this encounter.    Current Outpatient Medications:    acetaminophen (TYLENOL) 500 MG tablet, Take 1,000 mg by mouth in the morning and at bedtime., Disp: , Rfl:    albuterol (PROAIR HFA) 108 (90 Base) MCG/ACT inhaler, Inhale 2 puffs into the lungs every 6 (six) hours as needed for wheezing or shortness of breath., Disp: 18 g, Rfl: 4   albuterol (PROVENTIL) (2.5 MG/3ML) 0.083% nebulizer solution, Take 3 mLs (2.5 mg total) by nebulization in the morning and at bedtime., Disp: 75 mL, Rfl: 12   apixaban (ELIQUIS) 5 MG TABS tablet, Take 1 tablet (5 mg total) by mouth 2 (two) times daily., Disp: 60 tablet, Rfl: 1   ascorbic acid (VITAMIN C) 500 MG tablet, Take 500 mg by mouth daily., Disp: , Rfl:    atorvastatin (LIPITOR) 80 MG tablet, TAKE 1 TABLET BY MOUTH EVERY DAY, Disp: 90 tablet, Rfl: 0   baclofen (LIORESAL) 10 MG tablet, Take 1 tablet (10 mg total) by mouth 3 (three) times daily., Disp: 135 each, Rfl: 4   busPIRone (BUSPAR) 10 MG tablet, TAKE 2 TABLETS BY MOUTH 3 TIMES A DAY, Disp: 540 tablet, Rfl: 1   Calcium Citrate-Vitamin D (CALCIUM + D PO), Take 1,000 mg by mouth daily., Disp: , Rfl:    Cholecalciferol (VITAMIN D) 50 MCG (2000 UT) CAPS, Take by mouth., Disp: , Rfl:    clonazePAM (KLONOPIN) 0.5  MG tablet, TAKE 1 TABLET BY MOUTH EVERY DAY AS NEEDED FOR ANXIETY, Disp: 30 tablet, Rfl: 2   Ferrous Sulfate Dried (SM SLOW RELEASE IRON) 143 (45 Fe) MG TBCR, Take 1 tablet by mouth daily., Disp: , Rfl:    fluticasone (FLONASE) 50 MCG/ACT nasal spray, Place 2 sprays into both nostrils daily., Disp: 16 g, Rfl: 6   furosemide (LASIX) 20 MG tablet, Take 1 tablet (20 mg total) by  mouth daily. (Patient taking differently: Take 20 mg by mouth every morning.), Disp: 90 tablet, Rfl: 4   gabapentin (NEURONTIN) 800 MG tablet, Take 1 tablet (800 mg total) by mouth in the morning and at bedtime., Disp: 180 tablet, Rfl: 4   hydrOXYzine (ATARAX) 10 MG tablet, TAKE 1 TABLET BY MOUTH THREE TIMES A DAY AS NEEDED, Disp: 270 tablet, Rfl: 0   KLOR-CON M20 20 MEQ tablet, TAKE 1 TABLET BY MOUTH EVERY DAY, Disp: 90 tablet, Rfl: 0   Melatonin 10 MG TABS, Take 1 tablet by mouth at bedtime., Disp: , Rfl:    Multiple Vitamins-Minerals (MULTIVITAMIN WITH MINERALS) tablet, Take 1 tablet by mouth daily., Disp: , Rfl:    omeprazole (PRILOSEC) 40 MG capsule, TAKE 1 CAPSULE (40 MG TOTAL) BY MOUTH DAILY., Disp: 90 capsule, Rfl: 0   predniSONE (STERAPRED UNI-PAK 21 TAB) 10 MG (21) TBPK tablet, Take as instructed on package, Disp: 21 each, Rfl: 0   sodium chloride HYPERTONIC 3 % nebulizer solution, Take by nebulization in the morning and at bedtime., Disp: 750 mL, Rfl: 12   sotalol (BETAPACE) 80 MG tablet, Take 80 mg by mouth 2 (two) times daily., Disp: , Rfl:    Tiotropium Bromide-Olodaterol (STIOLTO RESPIMAT) 2.5-2.5 MCG/ACT AERS, Inhale 2 puffs into the lungs daily., Disp: 120 each, Rfl: 12   venlafaxine XR (EFFEXOR-XR) 150 MG 24 hr capsule, Take 1 capsule (150 mg total) by mouth daily with breakfast., Disp: 90 capsule, Rfl: 4   vitamin B-12 (CYANOCOBALAMIN) 1000 MCG tablet, Take 1,000 mcg by mouth daily., Disp: , Rfl:    Subjective:   PATIENT ID: [name] GENDER: female DOB: June 24, 1956, MRN: [id number]  Chief Complaint  Patient presents with   surgical clearance    HPI  68 year old female with history of Afib (on Eliquis) and COPD prsenting to clinic for follow up.  I first met [name] in May of 2024 for the evaluation of findings noted on LDCT for lung cancer screening. She underwent bronchoscopy on 04/09/2023 with BAL which grew H. Influenza (treated with antibiotics) and  improvement in symptoms. She is presenting today for follow up.  Symptoms are unchanged from prior. She is tolerating Stiolto well and felt improvement with it. She continues to be in her usual state of health and has an occasional cough but no wheeze or increased shortness of breath. She does report exertional dyspnea. She is limited with activity secondary to arthritis. She is requesting pre-operative evaluation and risk assessment.    Patient was enrolled in lung cancer screening and was note to have a left lingular infiltrate with tree in bud nodularity that have increased over the past few scans. The two CT's from November of 2023 and February of 2024 showed a nodular and mass-like areas of consolidation in the lingula. On my review, there were tree-in-bud opacities in that area as well. She underwent flexible bronchoscopy on 04/09/2023 with BAL growing H. Influenza. I prescribed her a course of Cefpodoxime which she tolerated well. Repeat CT in July of 2024  showed resolution of the pulmonary infiltrates.   Patient is a current smoker and has smoked for around 50 years, with 50 pack years of smoking history. She used to work in a [employer]. She lives at home with her son, and has lived in her house for over 20 years. She has a crawl space, no basement, no mold, no hot tub, and no Jacuzzi. Water source is municipal. No report of previous infections.  Ancillary information including prior medications, full medical/surgical/family/social histories, and PFTs (when available) are listed below and have been reviewed.   Review of Systems  Constitutional:  Negative for chills, fever, malaise/fatigue and weight loss.  Respiratory:  Negative for cough, hemoptysis, sputum production, shortness of breath and wheezing.   Cardiovascular:  Negative for chest pain and palpitations.     Objective:   Vitals:   02/09/24 1103  BP: 128/70  Pulse: 71  Temp: 98.2 F (36.8 C)  TempSrc: Oral  SpO2: 98%   Weight: 236 lb 6.4 oz (107.2 kg)  Height: 5\' 3"  (1.6 m)   98% on RA BMI Readings from Last 3 Encounters:  02/09/24 41.88 kg/m  02/07/24 41.31 kg/m  01/18/24 40.74 kg/m   Wt Readings from Last 3 Encounters:  02/09/24 236 lb 6.4 oz (107.2 kg)  02/07/24 233 lb 3.2 oz (105.8 kg)  01/18/24 230 lb (104.3 kg)    Physical Exam Constitutional:      General: She is not in acute distress.    Appearance: She is not ill-appearing.  HENT:     Head: Normocephalic.     Mouth/Throat:     Mouth: Mucous membranes are moist.  Cardiovascular:     Rate and Rhythm: Normal rate and regular rhythm.     Pulses: Normal pulses.     Heart sounds: Normal heart sounds.  Pulmonary:     Effort: Pulmonary effort is normal.     Breath sounds: No wheezing.  Abdominal:     Palpations: Abdomen is soft.  Neurological:     General: No focal deficit present.     Mental Status: She is alert and oriented to person, place, and time. Mental status is at baseline.       Ancillary Information    Past Medical History:  Diagnosis Date   Anxiety    Aortic atherosclerosis (HCC)    Arthritis    Atrial fibrillation with RVR (HCC)    a.) CHA2DS2VASc = 4 (age, sex, HTN, vascular disease history);  b.) rate/rhythm maintained on oral sotolol; chronically anticoagulated with apixaban   Atypical chest pain    Benign essential tremor    Chronic kidney disease, stage 3a (HCC)    COPD (chronic obstructive pulmonary disease) (HCC)    DDD (degenerative disc disease), cervical    a.) s/p ACDF C6-C7   DDD (degenerative disc disease), lumbar    Enlarged heart    Fatty liver    GERD (gastroesophageal reflux disease)    Hiatal hernia    Hyperlipidemia    Hypertension    Insomnia    a.) takes melatonin PRN   Long term current use of anticoagulant    a.) apixaban   Mass of right adrenal gland (HCC)    a.) CT AP 08/12/2022: measured 3.3 cm   Monocytosis    Morbid obesity (HCC)    Nicotine dependence    OSA on  CPAP    Osteoporosis    Pancreatitis    Peripheral vascular disease (HCC)    Psoriasis  SOB (shortness of breath)    Umbilical hernia    Vitamin D deficiency      Family History  Problem Relation Age of Onset   COPD Father    Heart disease Father    Hyperlipidemia Father    Hypertension Father    Dementia Father    Cancer Sister    Hyperlipidemia Sister    Hypertension Sister    Lymphoma Sister    Migraines Son    Stroke Maternal Grandfather    Heart attack Brother    Cancer Brother        colorectal and liver   Breast cancer Paternal Aunt    Breast cancer Paternal Aunt      Past Surgical History:  Procedure Laterality Date   ABDOMINAL HYSTERECTOMY     ACHILLES TENDON REPAIR     Removed bone spur and repaired achilles tendon   ANTERIOR CERVICAL DECOMP/DISCECTOMY FUSION     APPENDECTOMY     COLONOSCOPY WITH PROPOFOL N/A 01/17/2019   Procedure: COLONOSCOPY WITH PROPOFOL;  Surgeon: Wyline Mood, MD;  Location: [facility];  Service: Gastroenterology;  Laterality: N/A;   FLEXIBLE BRONCHOSCOPY Bilateral 04/09/2023   Procedure: FLEXIBLE BRONCHOSCOPY;  Surgeon: Kathleen Chute, MD;  Location: [facility];  Service: Pulmonary;  Laterality: Bilateral;   TONSILLECTOMY      Social History   Socioeconomic History   Marital status: Widowed    Spouse name: Not on file   Number of children: 1   Years of education: Not on file   Highest education level: GED or equivalent  Occupational History   Occupation: disabled  Tobacco Use   Smoking status: Every Day    Current packs/day: 1.00    Average packs/day: 1 pack/day for 53.2 years (53.2 ttl pk-yrs)    Types: Cigarettes    Start date: 40    Passive exposure: Past   Smokeless tobacco: Never   Tobacco comments:    Smoking 1 ppd.  She has used patches/lozenges and they did not help.  02/09/2024 hfb RN  Vaping Use   Vaping status: Never Used  Substance and Sexual Activity   Alcohol use: No    Alcohol/week: 0.0 standard  drinks of alcohol   Drug use: No   Sexual activity: Never    Partners: Male  Other Topics Concern   Not on file  Social History Narrative   Not on file   Social Drivers of Health   Financial Resource Strain: Low Risk  (01/18/2024)   Overall Financial Resource Strain (CARDIA)    Difficulty of Paying Living Expenses: Not hard at all  Food Insecurity: No Food Insecurity (01/18/2024)   Hunger Vital Sign    Worried About Running Out of Food in the Last Year: Never true    Ran Out of Food in the Last Year: Never true  Transportation Needs: No Transportation Needs (01/18/2024)   PRAPARE - Administrator, Civil Service (Medical): No    Lack of Transportation (Non-Medical): No  Physical Activity: Inactive (01/18/2024)   Exercise Vital Sign    Days of Exercise per Week: 0 days    Minutes of Exercise per Session: 0 min  Stress: Stress Concern Present (01/18/2024)   [employer] of Occupational Health - Occupational Stress Questionnaire    Feeling of Stress : To some extent  Social Connections: Socially Isolated (01/18/2024)   Social Connection and Isolation Panel [NHANES]    Frequency of Communication with Friends and Family: Once a week  Frequency of Social Gatherings with Friends and Family: More than three times a week    Attends Religious Services: Never    Database administrator or Organizations: No    Attends Banker Meetings: Never    Marital Status: Widowed  Intimate Partner Violence: Not At Risk (01/18/2024)   Humiliation, Afraid, Rape, and Kick questionnaire    Fear of Current or Ex-Partner: No    Emotionally Abused: No    Physically Abused: No    Sexually Abused: No     Allergies  Allergen Reactions   Iodinated Contrast Media Rash    Had rash present to neck with PET scan contrast     CBC    Component Value Date/Time   WBC 8.8 02/07/2024 1530   WBC 10.1 03/31/2023 0321   RBC 4.47 02/07/2024 1530   RBC 3.99 03/31/2023 0321   HGB 12.0  02/07/2024 1530   HCT 38.9 02/07/2024 1530   PLT 285 02/07/2024 1530   MCV 87 02/07/2024 1530   MCV 93 03/23/2014 1851   MCH 26.8 02/07/2024 1530   MCH 29.3 03/31/2023 0321   MCHC 30.8 (L) 02/07/2024 1530   MCHC 31.4 03/31/2023 0321   RDW 19.3 (H) 02/07/2024 1530   RDW 14.7 (H) 03/23/2014 1851   LYMPHSABS 3.7 (H) 02/07/2024 1530   MONOABS 1.0 03/31/2023 0321   EOSABS 0.2 02/07/2024 1530   BASOSABS 0.0 02/07/2024 1530    Pulmonary Functions Testing Results:    Latest Ref Rng & Units 08/24/2023    3:27 PM  PFT Results  FVC-Pre L 2.37   FVC-Predicted Pre % 79   FVC-Post L 2.31   FVC-Predicted Post % 77   Pre FEV1/FVC % % 72   Post FEV1/FCV % % 74   FEV1-Pre L 1.70   FEV1-Predicted Pre % 74   FEV1-Post L 1.70   DLCO uncorrected ml/min/mmHg 11.07   DLCO UNC% % 58   DLVA Predicted % 63   TLC L 7.90   TLC % Predicted % 161   RV % Predicted % 266     Outpatient Medications Prior to Visit  Medication Sig Dispense Refill   acetaminophen (TYLENOL) 500 MG tablet Take 1,000 mg by mouth in the morning and at bedtime.     albuterol (PROAIR HFA) 108 (90 Base) MCG/ACT inhaler Inhale 2 puffs into the lungs every 6 (six) hours as needed for wheezing or shortness of breath. 18 g 4   albuterol (PROVENTIL) (2.5 MG/3ML) 0.083% nebulizer solution Take 3 mLs (2.5 mg total) by nebulization in the morning and at bedtime. 75 mL 12   apixaban (ELIQUIS) 5 MG TABS tablet Take 1 tablet (5 mg total) by mouth 2 (two) times daily. 60 tablet 1   ascorbic acid (VITAMIN C) 500 MG tablet Take 500 mg by mouth daily.     atorvastatin (LIPITOR) 80 MG tablet TAKE 1 TABLET BY MOUTH EVERY DAY 90 tablet 0   baclofen (LIORESAL) 10 MG tablet Take 1 tablet (10 mg total) by mouth 3 (three) times daily. 135 each 4   busPIRone (BUSPAR) 10 MG tablet TAKE 2 TABLETS BY MOUTH 3 TIMES A DAY 540 tablet 1   Calcium Citrate-Vitamin D (CALCIUM + D PO) Take 1,000 mg by mouth daily.     Cholecalciferol (VITAMIN D) 50 MCG (2000  UT) CAPS Take by mouth.     clonazePAM (KLONOPIN) 0.5 MG tablet TAKE 1 TABLET BY MOUTH EVERY DAY AS NEEDED FOR ANXIETY 30 tablet 2  Ferrous Sulfate Dried (SM SLOW RELEASE IRON) 143 (45 Fe) MG TBCR Take 1 tablet by mouth daily.     fluticasone (FLONASE) 50 MCG/ACT nasal spray Place 2 sprays into both nostrils daily. 16 g 6   furosemide (LASIX) 20 MG tablet Take 1 tablet (20 mg total) by mouth daily. (Patient taking differently: Take 20 mg by mouth every morning.) 90 tablet 4   gabapentin (NEURONTIN) 800 MG tablet Take 1 tablet (800 mg total) by mouth in the morning and at bedtime. 180 tablet 4   hydrOXYzine (ATARAX) 10 MG tablet TAKE 1 TABLET BY MOUTH THREE TIMES A DAY AS NEEDED 270 tablet 0   KLOR-CON M20 20 MEQ tablet TAKE 1 TABLET BY MOUTH EVERY DAY 90 tablet 0   Melatonin 10 MG TABS Take 1 tablet by mouth at bedtime.     Multiple Vitamins-Minerals (MULTIVITAMIN WITH MINERALS) tablet Take 1 tablet by mouth daily.     omeprazole (PRILOSEC) 40 MG capsule TAKE 1 CAPSULE (40 MG TOTAL) BY MOUTH DAILY. 90 capsule 0   predniSONE (STERAPRED UNI-PAK 21 TAB) 10 MG (21) TBPK tablet Take as instructed on package 21 each 0   sodium chloride HYPERTONIC 3 % nebulizer solution Take by nebulization in the morning and at bedtime. 750 mL 12   sotalol (BETAPACE) 80 MG tablet Take 80 mg by mouth 2 (two) times daily.     Tiotropium Bromide-Olodaterol (STIOLTO RESPIMAT) 2.5-2.5 MCG/ACT AERS Inhale 2 puffs into the lungs daily. 120 each 12   venlafaxine XR (EFFEXOR-XR) 150 MG 24 hr capsule Take 1 capsule (150 mg total) by mouth daily with breakfast. 90 capsule 4   vitamin B-12 (CYANOCOBALAMIN) 1000 MCG tablet Take 1,000 mcg by mouth daily.     No facility-administered medications prior to visit.

## 2024-02-13 ENCOUNTER — Other Ambulatory Visit: Payer: Self-pay | Admitting: Nurse Practitioner

## 2024-02-13 NOTE — Patient Instructions (Signed)
 Be Involved in Caring For Your Health:  Taking Medications When medications are taken as directed, they can greatly improve your health. But if they are not taken as prescribed, they may not work. In some cases, not taking them correctly can be harmful. To help ensure your treatment remains effective and safe, understand your medications and how to take them. Bring your medications to each visit for review by your provider.  Your lab results, notes, and after visit summary will be available on My Chart. We strongly encourage you to use this feature. If lab results are abnormal the clinic will contact you with the appropriate steps. If the clinic does not contact you assume the results are satisfactory. You can always view your results on My Chart. If you have questions regarding your health or results, please contact the clinic during office hours. You can also ask questions on My Chart.  We at [facility] are grateful that you chose [place] to provide your care. We strive to provide evidence-based and compassionate care and are always looking for feedback. If you get a survey from the clinic please complete this so we can hear your opinions.  Heart-Healthy Eating Plan Many factors influence your heart health, including eating and exercise habits. Heart health is also called coronary health. Coronary risk increases with abnormal blood fat (lipid) levels. A heart-healthy eating plan includes limiting unhealthy fats, increasing healthy fats, limiting salt (sodium) intake, and making other diet and lifestyle changes. What is my plan? Your health care provider may recommend that: You limit your fat intake to _________% or less of your total calories each day. You limit your saturated fat intake to _________% or less of your total calories each day. You limit the amount of cholesterol in your diet to less than _________ mg per day. You limit the amount of sodium in your diet to less than _________  mg per day. What are tips for following this plan? Cooking Cook foods using methods other than frying. Baking, boiling, grilling, and broiling are all good options. Other ways to reduce fat include: Removing the skin from poultry. Removing all visible fats from meats. Steaming vegetables in water or broth. Meal planning  At meals, imagine dividing your plate into fourths: Fill one-half of your plate with vegetables and green salads. Fill one-fourth of your plate with whole grains. Fill one-fourth of your plate with lean protein foods. Eat 2-4 cups of vegetables per day. One cup of vegetables equals 1 cup (91 g) broccoli or cauliflower florets, 2 medium carrots, 1 large bell pepper, 1 large sweet potato, 1 large tomato, 1 medium white potato, 2 cups (150 g) raw leafy greens. Eat 1-2 cups of fruit per day. One cup of fruit equals 1 small apple, 1 large banana, 1 cup (237 g) mixed fruit, 1 large orange,  cup (82 g) dried fruit, 1 cup (240 mL) 100% fruit juice. Eat more foods that contain soluble fiber. Examples include apples, broccoli, carrots, beans, peas, and barley. Aim to get 25-30 g of fiber per day. Increase your consumption of legumes, nuts, and seeds to 4-5 servings per week. One serving of dried beans or legumes equals  cup (90 g) cooked, 1 serving of nuts is  oz (12 almonds, 24 pistachios, or 7 walnut halves), and 1 serving of seeds equals  oz (8 g). Fats Choose healthy fats more often. Choose monounsaturated and polyunsaturated fats, such as olive and canola oils, avocado oil, flaxseeds, walnuts, almonds, and seeds. Eat  more omega-3 fats. Choose salmon, mackerel, sardines, tuna, flaxseed oil, and ground flaxseeds. Aim to eat fish at least 2 times each week. Check food labels carefully to identify foods with trans fats or high amounts of saturated fat. Limit saturated fats. These are found in animal products, such as meats, butter, and cream. Plant sources of saturated fats  include palm oil, palm kernel oil, and coconut oil. Avoid foods with partially hydrogenated oils in them. These contain trans fats. Examples are stick margarine, some tub margarines, cookies, crackers, and other baked goods. Avoid fried foods. General information Eat more home-cooked food and less restaurant, buffet, and fast food. Limit or avoid alcohol. Limit foods that are high in added sugar and simple starches such as foods made using white refined flour (white breads, pastries, sweets). Lose weight if you are overweight. Losing just 5-10% of your body weight can help your overall health and prevent diseases such as diabetes and heart disease. Monitor your sodium intake, especially if you have high blood pressure. Talk with your health care provider about your sodium intake. Try to incorporate more vegetarian meals weekly. What foods should I eat? Fruits All fresh, canned (in natural juice), or frozen fruits. Vegetables Fresh or frozen vegetables (raw, steamed, roasted, or grilled). Green salads. Grains Most grains. Choose whole wheat and whole grains most of the time. Rice and pasta, including brown rice and pastas made with whole wheat. Meats and other proteins Lean, well-trimmed beef, veal, pork, and lamb. Chicken and [place] without skin. All fish and shellfish. Wild duck, rabbit, pheasant, and venison. Egg whites or low-cholesterol egg substitutes. Dried beans, peas, lentils, and tofu. Seeds and most nuts. Dairy Low-fat or nonfat cheeses, including ricotta and mozzarella. Skim or 1% milk (liquid, powdered, or evaporated). Buttermilk made with low-fat milk. Nonfat or low-fat yogurt. Fats and oils Non-hydrogenated (trans-free) margarines. Vegetable oils, including soybean, sesame, sunflower, olive, avocado, peanut, safflower, corn, canola, and cottonseed. Salad dressings or mayonnaise made with a vegetable oil. Beverages Water (mineral or sparkling). Coffee and tea. Unsweetened ice  tea. Diet beverages. Sweets and desserts Sherbet, gelatin, and fruit ice. Small amounts of dark chocolate. Limit all sweets and desserts. Seasonings and condiments All seasonings and condiments. The items listed above may not be a complete list of foods and beverages you can eat. Contact a dietitian for more options. What foods should I avoid? Fruits Canned fruit in heavy syrup. Fruit in cream or butter sauce. Fried fruit. Limit coconut. Vegetables Vegetables cooked in cheese, cream, or butter sauce. Fried vegetables. Grains Breads made with saturated or trans fats, oils, or whole milk. Croissants. Sweet rolls. Donuts. High-fat crackers, such as cheese crackers and chips. Meats and other proteins Fatty meats, such as hot dogs, ribs, sausage, bacon, rib-eye roast or steak. High-fat deli meats, such as salami and bologna. Caviar. Domestic duck and goose. Organ meats, such as liver. Dairy Cream, sour cream, cream cheese, and creamed cottage cheese. Whole-milk cheeses. Whole or 2% milk (liquid, evaporated, or condensed). Whole buttermilk. Cream sauce or high-fat cheese sauce. Whole-milk yogurt. Fats and oils Meat fat, or shortening. Cocoa butter, hydrogenated oils, palm oil, coconut oil, palm kernel oil. Solid fats and shortenings, including bacon fat, salt pork, lard, and butter. Nondairy cream substitutes. Salad dressings with cheese or sour cream. Beverages Regular sodas and any drinks with added sugar. Sweets and desserts Frosting. Pudding. Cookies. Cakes. Pies. Milk chocolate or white chocolate. Buttered syrups. Full-fat ice cream or ice cream drinks. The items listed above may  not be a complete list of foods and beverages to avoid. Contact a dietitian for more information. Summary Heart-healthy meal planning includes limiting unhealthy fats, increasing healthy fats, limiting salt (sodium) intake and making other diet and lifestyle changes. Lose weight if you are overweight. Losing just  5-10% of your body weight can help your overall health and prevent diseases such as diabetes and heart disease. Focus on eating a balance of foods, including fruits and vegetables, low-fat or nonfat dairy, lean protein, nuts and legumes, whole grains, and heart-healthy oils and fats. This information is not intended to replace advice given to you by your health care provider. Make sure you discuss any questions you have with your health care provider. Document Revised: 12/22/2021 Document Reviewed: 12/22/2021 Elsevier Patient Education  2024 [employer].

## 2024-02-14 ENCOUNTER — Other Ambulatory Visit: Payer: Self-pay | Admitting: Physician Assistant

## 2024-02-14 ENCOUNTER — Ambulatory Visit
Admission: RE | Admit: 2024-02-14 | Discharge: 2024-02-14 | Disposition: A | Discharge status: Home / Self Care | Source: Ambulatory Visit | Attending: Physician Assistant | Admitting: Physician Assistant

## 2024-02-14 DIAGNOSIS — K7460 Unspecified cirrhosis of liver: Secondary | ICD-10-CM | POA: Insufficient documentation

## 2024-02-14 DIAGNOSIS — J439 Emphysema, unspecified: Secondary | ICD-10-CM | POA: Diagnosis not present

## 2024-02-14 DIAGNOSIS — R1011 Right upper quadrant pain: Secondary | ICD-10-CM | POA: Diagnosis not present

## 2024-02-14 DIAGNOSIS — K8020 Calculus of gallbladder without cholecystitis without obstruction: Secondary | ICD-10-CM | POA: Diagnosis not present

## 2024-02-14 DIAGNOSIS — R160 Hepatomegaly, not elsewhere classified: Secondary | ICD-10-CM | POA: Diagnosis not present

## 2024-02-14 MED ORDER — GADOBUTROL 1 MMOL/ML IV SOLN
10.0000 mL | Freq: Once | INTRAVENOUS | Status: AC | PRN
  Administered 2024-02-14: 10 mL via INTRAVENOUS

## 2024-02-15 ENCOUNTER — Encounter: Payer: Self-pay | Admitting: Nurse Practitioner

## 2024-02-15 ENCOUNTER — Ambulatory Visit: Payer: PPO | Admitting: Nurse Practitioner

## 2024-02-15 VITALS — BP 132/64 | HR 57 | Temp 97.9°F | Ht 63.0 in | Wt 237.0 lb

## 2024-02-15 DIAGNOSIS — I4891 Unspecified atrial fibrillation: Secondary | ICD-10-CM | POA: Diagnosis not present

## 2024-02-15 DIAGNOSIS — F324 Major depressive disorder, single episode, in partial remission: Secondary | ICD-10-CM

## 2024-02-15 DIAGNOSIS — E782 Mixed hyperlipidemia: Secondary | ICD-10-CM | POA: Diagnosis not present

## 2024-02-15 DIAGNOSIS — F419 Anxiety disorder, unspecified: Secondary | ICD-10-CM | POA: Diagnosis not present

## 2024-02-15 DIAGNOSIS — K769 Liver disease, unspecified: Secondary | ICD-10-CM | POA: Diagnosis not present

## 2024-02-15 DIAGNOSIS — I739 Peripheral vascular disease, unspecified: Secondary | ICD-10-CM

## 2024-02-15 DIAGNOSIS — N1831 Chronic kidney disease, stage 3a: Secondary | ICD-10-CM | POA: Diagnosis not present

## 2024-02-15 DIAGNOSIS — R7303 Prediabetes: Secondary | ICD-10-CM

## 2024-02-15 DIAGNOSIS — I700 Atherosclerosis of aorta: Secondary | ICD-10-CM | POA: Diagnosis not present

## 2024-02-15 DIAGNOSIS — I10 Essential (primary) hypertension: Secondary | ICD-10-CM

## 2024-02-15 DIAGNOSIS — J432 Centrilobular emphysema: Secondary | ICD-10-CM

## 2024-02-15 MED ORDER — FUROSEMIDE 20 MG PO TABS: 20.0000 mg | ORAL_TABLET | Freq: Every day | ORAL | 4 refills | Status: DC

## 2024-02-15 MED ORDER — POTASSIUM CHLORIDE CRYS ER 20 MEQ PO TBCR: 20.0000 meq | EXTENDED_RELEASE_TABLET | Freq: Every day | ORAL | 4 refills | Status: AC

## 2024-02-15 MED ORDER — OMEPRAZOLE 40 MG PO CPDR: 40.0000 mg | DELAYED_RELEASE_CAPSULE | Freq: Every day | ORAL | 4 refills | Status: AC

## 2024-02-15 MED ORDER — HYDROXYZINE HCL 10 MG PO TABS: 10.0000 mg | ORAL_TABLET | Freq: Three times a day (TID) | ORAL | 4 refills | Status: AC | PRN

## 2024-02-15 MED ORDER — VENLAFAXINE HCL ER 150 MG PO CP24: 150.0000 mg | ORAL_CAPSULE | Freq: Every day | ORAL | 4 refills | Status: AC

## 2024-02-15 MED ORDER — ATORVASTATIN CALCIUM 80 MG PO TABS: 80.0000 mg | ORAL_TABLET | Freq: Every day | ORAL | 4 refills | Status: AC

## 2024-02-15 MED ORDER — CLONAZEPAM 0.5 MG PO TABS: 0.5000 mg | ORAL_TABLET | Freq: Every evening | ORAL | 2 refills | Status: DC | PRN

## 2024-02-15 NOTE — Assessment & Plan Note (Signed)
Chronic, stable.  Diagnosed 04/14/22 in hospital.  At this time HR stable and no symptoms.  Continue collaboration with cardiology and current medication regimen, adjust as needed.

## 2024-02-15 NOTE — Telephone Encounter (Signed)
 Requested Prescriptions  Pending Prescriptions Disp Refills   fluticasone (FLONASE) 50 MCG/ACT nasal spray [Pharmacy Med Name: FLUTICASONE PROP 50 MCG SPRAY] 48 mL 1    Sig: SPRAY 2 SPRAYS INTO EACH NOSTRIL EVERY DAY     Ear, Nose, and Throat: Nasal Preparations - Corticosteroids Passed - 02/15/2024  8:21 AM      Passed - Valid encounter within last 12 months    Recent Outpatient Visits           3 months ago Chronic kidney disease, stage 3a (HCC)   [facility] [place], Corrie Dandy T, NP   6 months ago Centrilobular emphysema (HCC)   [facility] [place], [place] T, NP   9 months ago Depression, major, single episode, in partial remission (HCC)   [place] [facility] [place], Corrie Dandy T, NP   10 months ago PVD (peripheral vascular disease) (HCC)   [place] [facility] [place], Corrie Dandy T, NP   11 months ago Chronic kidney disease, stage 3a (HCC)   [place] [facility] [place], Dorie Rank, NP       Future Appointments             Today Marjie Skiff, NP  [facility]   In 1 month Wyline Mood, MD [facility]

## 2024-02-15 NOTE — Assessment & Plan Note (Signed)
 Chronic, ongoing.  Continue statin and adjust as needed -- Atorvastatin 80 MG.  Lipid panel next visit.

## 2024-02-15 NOTE — Assessment & Plan Note (Signed)
 Chronic, ongoing.  Continue current medication regimen and adjust as needed.  Not O2 dependent at this time.  Continue to recommend complete smoking cessation. Continue collaboration with pulmonary, appreciate their input.  Recent note reviewed.

## 2024-02-15 NOTE — Assessment & Plan Note (Signed)
Chronic, stable at this time. Denies SI/HI.  Continue Effexor XL 150 MG daily + Buspar and Vistaril.  Continue at lowest dosing, Klonopin 0.5 MG daily as needed only -- take only as needed.  Aware of need for every 3 month visit, yearly drug screen, and controlled substance agreement.  Controlled substance agreement up to date and UDS due 05/16/24.  Consider Prozac, as worked well in past, but this could interact with cardiac medications (QT prolongation) - will not initiate this at this time.  Consider psychiatry referral in future.  Monitor BP with Effexor.

## 2024-02-15 NOTE — Assessment & Plan Note (Signed)
Chronic, evidenced by decreased hair pattern and baseline edema.  Is long time smoker, recommend complete cessation.  Consider vascular visit in future.  At this time monitor skin closely for wounds and wear compression hose daily.

## 2024-02-15 NOTE — Assessment & Plan Note (Signed)
 Ongoing.  Reminded to avoid NSAIDs.  May need to adjust Gabapentin based on labs, will review and determine need to reduce -- discussed with patient.  Labs today: up to date.  Increase fluid intake at home.

## 2024-02-15 NOTE — Assessment & Plan Note (Signed)
?  cirrhosis on recent imaging.  LFTs have been stable.  Continue collaboration with GI, appreciate their input.  Recent notes and labs reviewed.

## 2024-02-15 NOTE — Assessment & Plan Note (Signed)
 Chronic, stable.  BP at goal today.  Continue current medication regimen and adjust as needed -- is taking BB and Lasix only at this time.  Previously took Telmisartan and HCTZ.  Recommend she monitor BP at home daily and document for provider + focus on DASH diet.  LABS: up to date.  Urine ALB 29 December 2022, may benefit from return to low dose ARB in future.

## 2024-02-15 NOTE — Assessment & Plan Note (Signed)
 BMI 41.98 with HTN and COPD, A-Fib.  Recommended eating smaller high protein, low fat meals more frequently and exercising 30 mins a day 5 times a week with a goal of 10-15lb weight loss in the next 3 months. Patient voiced their understanding and motivation to adhere to these recommendations.  Tolerated Ozempic with total 68 pounds loss, but insurance no longer covering. Currently she has gained weight while off medication.

## 2024-02-15 NOTE — Assessment & Plan Note (Signed)
Chronic, ongoing, noted on lung CT 10/02/2019.  Recommend she continue ASA and statin daily for prevention.  Recommend complete cessation of smoking. °

## 2024-02-15 NOTE — Assessment & Plan Note (Signed)
 Refer to depression plan of care.

## 2024-02-15 NOTE — Progress Notes (Signed)
 BP 132/64 (BP Location: Left Arm, Patient Position: Sitting, Cuff Size: Large)   Pulse (!) 57   Temp 97.9 F (36.6 C) (Oral)   Ht 5\' 3"  (1.6 m)   Wt 237 lb (107.5 kg)   LMP  (LMP Unknown)   SpO2 96%   BMI 41.98 kg/m    Subjective:    Patient ID: [name], female    DOB: 1955/12/03, 68 y.o.   MRN: [id number]  HPI: [name] is a 68 y.o. female  Chief Complaint  Patient presents with   Anxiety   Hyperlipidemia   Hypertension   Back Pain   ATRIAL FIBRILLATION Continues Eliquis, Sotalol, and Lasix + Atorvastatin. Last visit with cardiology was 01/17/24, no changes made.  History: Is off Ozempic as insurance not covering (has been off for 9 months). Had been on for one year and drastic weight loss and improvement in quality of life with this on board.  Started 12/10/21 at 256 lbs and then went down to 188 lbs = total 68 pounds loss. She had met her goal. Now she is gaining back without it on board. Atrial fibrillation status: stable Satisfied with current treatment: yes  Medication side effects:  no Medication compliance: good compliance Etiology of atrial fibrillation: unknown Palpitations:  no Chest pain:  no Dyspnea on exertion: no Orthopnea:  no Syncope:  no Edema: no Ventricular rate control: Sotalol Anti-coagulation: long acting   Impaired Fasting Glucose HbA1C:  Lab Results  Component Value Date   HGBA1C 5.9 (H) 11/17/2023  Duration of elevated blood sugar: years Polydipsia: no Polyuria: no Weight change: no Visual disturbance: no Glucose Monitoring: no    Accucheck frequency: Not Checking    Fasting glucose:     Post prandial:  Diabetic Education: Not Completed Family history of diabetes: yes   COPD Lung screening, last check on 06/07/23, aortic atherosclerosis and centrilobular emphysema. Last saw pulmonary 02/09/24, no changes made. Continues to smoke 1 PPD, has been smoking since teen years. COPD status: stable Satisfied with current  treatment?: yes Oxygen use: no Dyspnea frequency: none Cough frequency: none Rescue inhaler frequency: none Limitation of activity: no Productive cough: none Last Spirometry: unknown Pneumovax: Up to Date Influenza: Up to Date  BACK PAIN Did follow with pain clinic for period, but decided she would prefer benzo vs opioid, as anxiety is difficult for her. Continues Baclofen. Duration: months Mechanism of injury: unknown Location: bilateral and low back Onset: gradual Severity: 6/10 -- when walks is a 10/10 Quality: sharp, dull, aching, and throbbing Frequency: constant Radiation: R leg below the knee Aggravating factors: lifting, movement, walking, and bending Alleviating factors: nothing Status: fluctuating Treatments attempted: rest, heat, and APAP  Relief with NSAIDs?: No NSAIDs Taken Nighttime pain:  yes Paresthesias / decreased sensation:  no Bowel / bladder incontinence:  no Fevers:  no Dysuria / urinary frequency:  no   CHRONIC KIDNEY DISEASE CKD status: stable Medications renally dose: yes Previous renal evaluation: no Pneumovax:  Up to Date Influenza Vaccine:  Up to Date   DEPRESSION Continues Effexor XR 150 MG daily which has offered benefit, took Duloxetine.  Continues on Buspar 20 MG TID + Clonazepam 0.5 MG daily PRN, uses Klonopin once a day because will start crying around 8 or 10 if does not. Is not seeing pain clinic any longer due to taking benzo.  Pt is aware of risks of psychoactive medication use to include increased sedation, respiratory suppression, falls, dependence and cardiovascular events.  Pt would like to continue treatment as benefit determined to outweigh risk.  PDMP last fill 01/18/24. Mood status: stable Satisfied with current treatment?: yes Symptom severity: moderate  Duration of current treatment : chronic Side effects: no Medication compliance: good compliance Psychotherapy/counseling: none Depressed mood: sometimes Anxious mood: at  night time Anhedonia: no Significant weight loss or gain: no Insomnia: yes, falling asleep, gets panicked, takes Klonopin -- uses CPAP 100% of the time Fatigue: no Feelings of worthlessness or guilt: no Impaired concentration/indecisiveness: no Suicidal ideations: no Hopelessness: no Crying spells: yes    02/15/2024    2:26 PM 01/18/2024    2:05 PM 11/17/2023    3:08 PM 08/17/2023    2:47 PM 05/17/2023    2:48 PM  Depression screen PHQ 2/9  Decreased Interest 0 0 0 0 2  Down, Depressed, Hopeless 0 0 0 0 0  PHQ - 2 Score 0 0 0 0 2  Altered sleeping 0  0 0 2  Tired, decreased energy 0  0 0 0  Change in appetite 0  0 0 0  Feeling bad or failure about yourself  0  0 0 0  Trouble concentrating 0  0 0 0  Moving slowly or fidgety/restless 0  0 0 0  Suicidal thoughts 0  0 0 0  PHQ-9 Score 0  0 0 4  Difficult doing work/chores Not difficult at all  Not difficult at all Not difficult at all        02/15/2024    2:26 PM 11/17/2023    3:08 PM 08/17/2023    2:47 PM 05/17/2023    2:48 PM  GAD 7 : Generalized Anxiety Score  Nervous, Anxious, on Edge 3 0 3 3  Control/stop worrying 0 0 0 0  Worry too much - different things 0 0 0 0  Trouble relaxing 0 0 1 3  Restless 0 0 0 0  Easily annoyed or irritable 0 0 0 0  Afraid - awful might happen 0 0 0 0  Total GAD 7 Score 3 0 4 6  Anxiety Difficulty Not difficult at all Not difficult at all Not difficult at all    Relevant past medical, surgical, family and social history reviewed and updated as indicated. Interim medical history since our last visit reviewed. Allergies and medications reviewed and updated.  Review of Systems  Constitutional:  Negative for activity change, appetite change, diaphoresis, fatigue and fever.  HENT:  Negative for ear discharge and ear pain.   Respiratory:  Negative for cough, chest tightness, shortness of breath and wheezing.   Cardiovascular:  Negative for chest pain, palpitations and leg swelling.   Gastrointestinal: Negative.   Musculoskeletal:  Positive for arthralgias and back pain.  Neurological: Negative.   Psychiatric/Behavioral:  Negative for decreased concentration, self-injury, sleep disturbance and suicidal ideas. The patient is not nervous/anxious.    Per HPI unless specifically indicated above     Objective:    BP 132/64 (BP Location: Left Arm, Patient Position: Sitting, Cuff Size: Large)   Pulse (!) 57   Temp 97.9 F (36.6 C) (Oral)   Ht 5\' 3"  (1.6 m)   Wt 237 lb (107.5 kg)   LMP  (LMP Unknown)   SpO2 96%   BMI 41.98 kg/m   Wt Readings from Last 3 Encounters:  02/15/24 237 lb (107.5 kg)  02/09/24 236 lb 6.4 oz (107.2 kg)  02/07/24 233 lb 3.2 oz (105.8 kg)    Physical Exam Vitals and nursing note  reviewed.  Constitutional:      General: She is awake. She is not in acute distress.    Appearance: She is well-developed and well-groomed. She is obese. She is not ill-appearing or toxic-appearing.  HENT:     Head: Normocephalic.     Right Ear: Hearing and external ear normal.     Left Ear: Hearing and external ear normal.  Eyes:     General: Lids are normal.        Right eye: No discharge.        Left eye: No discharge.     Conjunctiva/sclera: Conjunctivae normal.     Pupils: Pupils are equal, round, and reactive to light.  Neck:     Thyroid: No thyromegaly.     Vascular: No carotid bruit.  Cardiovascular:     Rate and Rhythm: Normal rate and regular rhythm.     Heart sounds: Normal heart sounds. No murmur heard.    No gallop.  Pulmonary:     Effort: Pulmonary effort is normal. No accessory muscle usage or respiratory distress.     Breath sounds: Normal breath sounds.  Abdominal:     General: Bowel sounds are normal. There is no distension.     Palpations: Abdomen is soft.     Tenderness: There is no abdominal tenderness.  Musculoskeletal:     Cervical back: Normal range of motion and neck supple.     Right lower leg: 1+ Pitting Edema present.      Left lower leg: 1+ Pitting Edema present.  Lymphadenopathy:     Cervical: No cervical adenopathy.  Skin:    General: Skin is warm and dry.  Neurological:     Mental Status: She is alert and oriented to person, place, and time.     Deep Tendon Reflexes: Reflexes are normal and symmetric.     Reflex Scores:      Brachioradialis reflexes are 2+ on the right side and 2+ on the left side.      Patellar reflexes are 2+ on the right side and 2+ on the left side. Psychiatric:        Attention and Perception: Attention normal.        Mood and Affect: Mood normal.        Speech: Speech normal.        Behavior: Behavior normal. Behavior is cooperative.        Thought Content: Thought content normal.    Results for orders placed or performed in visit on 02/07/24  CBC with Differential/Platelet   Collection Time: 02/07/24  3:30 PM  Result Value Ref Range   WBC 8.8 3.4 - 10.8 x10E3/uL   RBC 4.47 3.77 - 5.28 x10E6/uL   Hemoglobin 12.0 11.1 - 15.9 g/dL   Hematocrit 40.9 81.1 - 46.6 %   MCV 87 79 - 97 fL   MCH 26.8 26.6 - 33.0 pg   MCHC 30.8 (L) 31.5 - 35.7 g/dL   RDW 91.4 (H) 78.2 - 95.6 %   Platelets 285 150 - 450 x10E3/uL   Neutrophils 44 Not Estab. %   Lymphs 43 Not Estab. %   Monocytes 10 Not Estab. %   Eos 2 Not Estab. %   Basos 1 Not Estab. %   Neutrophils Absolute 3.9 1.4 - 7.0 x10E3/uL   Lymphocytes Absolute 3.7 (H) 0.7 - 3.1 x10E3/uL   Monocytes Absolute 0.9 0.1 - 0.9 x10E3/uL   EOS (ABSOLUTE) 0.2 0.0 - 0.4 x10E3/uL   Basophils Absolute 0.0  0.0 - 0.2 x10E3/uL   Immature Granulocytes 0 Not Estab. %   Immature Grans (Abs) 0.0 0.0 - 0.1 x10E3/uL  Comprehensive metabolic panel   Collection Time: 02/07/24  3:30 PM  Result Value Ref Range   Glucose 111 (H) 70 - 99 mg/dL   BUN 14 8 - 27 mg/dL   Creatinine, Ser 5.78 0.57 - 1.00 mg/dL   eGFR 67 >46 NG/EXB/2.84   BUN/Creatinine Ratio 15 12 - 28   Sodium 143 134 - 144 mmol/L   Potassium 4.8 3.5 - 5.2 mmol/L   Chloride 108 (H) 96 -  106 mmol/L   CO2 24 20 - 29 mmol/L   Calcium 9.5 8.7 - 10.3 mg/dL   Total Protein 6.7 6.0 - 8.5 g/dL   Albumin 3.6 (L) 3.9 - 4.9 g/dL   Globulin, Total 3.1 1.5 - 4.5 g/dL   Bilirubin Total <1.3 0.0 - 1.2 mg/dL   Alkaline Phosphatase 188 (H) 44 - 121 IU/L   AST 30 0 - 40 IU/L   ALT 19 0 - 32 IU/L  Protime-INR   Collection Time: 02/07/24  3:30 PM  Result Value Ref Range   INR 1.0 0.9 - 1.2   Prothrombin Time 11.4 9.1 - 12.0 sec  Iron, TIBC and Ferritin Panel   Collection Time: 02/07/24  3:30 PM  Result Value Ref Range   Total Iron Binding Capacity 276 250 - 450 ug/dL   UIBC 244 010 - 272 ug/dL   Iron 50 27 - 536 ug/dL   Iron Saturation 18 15 - 55 %   Ferritin 207 (H) 15 - 150 ng/mL  Celiac Disease Ab Screen w/Rfx   Collection Time: 02/07/24  3:30 PM  Result Value Ref Range   Antigliadin Abs, IgA 5 0 - 19 units   Transglutaminase IgA <2 0 - 3 U/mL   IgA/Immunoglobulin A, Serum 188 87 - 352 mg/dL  Vitamin U44   Collection Time: 02/07/24  3:30 PM  Result Value Ref Range   Vitamin B-12 1,729 (H) 232 - 1,245 pg/mL  Gamma GT   Collection Time: 02/07/24  3:30 PM  Result Value Ref Range   GGT 278 (H) 0 - 60 IU/L  ANA   Collection Time: 02/07/24  3:30 PM  Result Value Ref Range   Anti Nuclear Antibody (ANA) Positive (A) Negative  Mitochondrial/smooth muscle ab pnl   Collection Time: 02/07/24  3:30 PM  Result Value Ref Range   Smooth Muscle Ab 23 (H) 0 - 19 Units   Mitochondrial Ab 200.9 (H) 0.0 - 20.0 Units      Assessment & Plan:   Problem List Items Addressed This Visit       Cardiovascular and Mediastinum   Aortic atherosclerosis (HCC)   Chronic, ongoing, noted on lung CT 10/02/2019.  Recommend she continue ASA and statin daily for prevention.  Recommend complete cessation of smoking.      Relevant Medications   atorvastatin (LIPITOR) 80 MG tablet   furosemide (LASIX) 20 MG tablet   Atrial fibrillation with RVR (HCC)   Chronic, stable.  Diagnosed 04/14/22 in  hospital.  At this time HR stable and no symptoms.  Continue collaboration with cardiology and current medication regimen, adjust as needed.        Relevant Medications   atorvastatin (LIPITOR) 80 MG tablet   furosemide (LASIX) 20 MG tablet   Benign hypertension   Chronic, stable.  BP at goal today.  Continue current medication regimen and adjust as needed --  is taking BB and Lasix only at this time.  Previously took Telmisartan and HCTZ.  Recommend she monitor BP at home daily and document for provider + focus on DASH diet.  LABS: up to date.  Urine ALB 29 December 2022, may benefit from return to low dose ARB in future.        Relevant Medications   atorvastatin (LIPITOR) 80 MG tablet   furosemide (LASIX) 20 MG tablet   PVD (peripheral vascular disease) (HCC)   Chronic, evidenced by decreased hair pattern and baseline edema.  Is long time smoker, recommend complete cessation.  Consider vascular visit in future.  At this time monitor skin closely for wounds and wear compression hose daily.      Relevant Medications   atorvastatin (LIPITOR) 80 MG tablet   furosemide (LASIX) 20 MG tablet     Respiratory   Centrilobular emphysema (HCC) - Primary   Chronic, ongoing.  Continue current medication regimen and adjust as needed.  Not O2 dependent at this time.  Continue to recommend complete smoking cessation. Continue collaboration with pulmonary, appreciate their input.  Recent note reviewed.        Digestive   Parenchymal liver disease   ?cirrhosis on recent imaging.  LFTs have been stable.  Continue collaboration with GI, appreciate their input.  Recent notes and labs reviewed.        Genitourinary   Chronic kidney disease, stage 3a (HCC)   Ongoing.  Reminded to avoid NSAIDs.  May need to adjust Gabapentin based on labs, will review and determine need to reduce -- discussed with patient.  Labs today: up to date.  Increase fluid intake at home.        Other   Prediabetes (Chronic)    Ongoing with A1c ranging from 5.7 to 5.9%, last 5.9% without Ozempic on board.  Insurance no longer covering Ozempic which offered her a lot of benefit.        Anxiety   Refer to depression plan of care.      Relevant Medications   hydrOXYzine (ATARAX) 10 MG tablet   venlafaxine XR (EFFEXOR-XR) 150 MG 24 hr capsule   Depression, major, single episode, in partial remission (HCC)   Chronic, stable at this time. Denies SI/HI.  Continue Effexor XL 150 MG daily + Buspar and Vistaril.  Continue at lowest dosing, Klonopin 0.5 MG daily as needed only -- take only as needed.  Aware of need for every 3 month visit, yearly drug screen, and controlled substance agreement.  Controlled substance agreement up to date and UDS due 05/16/24.  Consider Prozac, as worked well in past, but this could interact with cardiac medications (QT prolongation) - will not initiate this at this time.  Consider psychiatry referral in future.  Monitor BP with Effexor.      Relevant Medications   hydrOXYzine (ATARAX) 10 MG tablet   venlafaxine XR (EFFEXOR-XR) 150 MG 24 hr capsule   Hyperlipidemia   Chronic, ongoing.  Continue statin and adjust as needed -- Atorvastatin 80 MG.  Lipid panel next visit.      Relevant Medications   atorvastatin (LIPITOR) 80 MG tablet   furosemide (LASIX) 20 MG tablet   Morbid obesity (HCC)   BMI 41.98 with HTN and COPD, A-Fib.  Recommended eating smaller high protein, low fat meals more frequently and exercising 30 mins a day 5 times a week with a goal of 10-15lb weight loss in the next 3 months. Patient voiced their understanding and motivation  to adhere to these recommendations.  Tolerated Ozempic with total 68 pounds loss, but insurance no longer covering. Currently she has gained weight while off medication.          Follow up plan: Return in about 3 months (around 05/17/2024) for ANXIETY.

## 2024-02-15 NOTE — Assessment & Plan Note (Signed)
 Ongoing with A1c ranging from 5.7 to 5.9%, last 5.9% without Ozempic on board.  Insurance no longer covering Ozempic which offered her a lot of benefit.

## 2024-02-16 ENCOUNTER — Ambulatory Visit: Admitting: Student in an Organized Health Care Education/Training Program

## 2024-02-18 NOTE — Progress Notes (Signed)
 Call and notify patient abdominal MRI/MRCP shows: 1.  Liver cirrhosis and enlarged liver.  No suspicious liver masses or lesions.  Normal bile duct.  Recent labs were suspicious for primary biliary cirrhosis. 2.  Gallstone.  Gallbladder does not look infected.  No bile duct stone. 3.  Numerous enlarged upper abdominal lymph nodes, thought secondary to cirrhosis.  Stable and unchanged compared to previous abdominal CT 07/2022. 4.  Large ventral hernia containing loops of transverse colon and small intestine.  No evidence of bowel obstruction. I recommend continue with plan for EGD and colonoscopy as scheduled.  Also keep follow-up appointment with Dr. Tobi Bastos after procedures as scheduled.  I am forwarding MRI results to Dr. Tobi Bastos for FYI. Celso Amy, PA-C

## 2024-02-22 ENCOUNTER — Telehealth: Payer: Self-pay

## 2024-02-22 NOTE — Telephone Encounter (Signed)
 Left message to call office--  Celso Amy, PA-C  Martyn Ehrich, CMA Cc: Wyline Mood, MD Call and notify patient abdominal MRI/MRCP shows: 1.  Liver cirrhosis and enlarged liver.  No suspicious liver masses or lesions.  Normal bile duct.  Recent labs were suspicious for primary biliary cirrhosis. 2.  Gallstone.  Gallbladder does not look infected.  No bile duct stone. 3.  Numerous enlarged upper abdominal lymph nodes, thought secondary to cirrhosis.  Stable and unchanged compared to previous abdominal CT 07/2022. 4.  Large ventral hernia containing loops of transverse colon and small intestine.  No evidence of bowel obstruction. I recommend continue with plan for EGD and colonoscopy as scheduled.  Also keep follow-up appointment with Dr. Tobi Bastos after procedures as scheduled.  I am forwarding MRI results to Dr. Tobi Bastos for FYI. Celso Amy, PA-C

## 2024-02-22 NOTE — Telephone Encounter (Signed)
 Patient notified of results.    Celso Amy, PA-C  Martyn Ehrich, [place] Cc: Wyline Mood, MD Call and notify patient abdominal MRI/MRCP shows: 1.  Liver cirrhosis and enlarged liver.  No suspicious liver masses or lesions.  Normal bile duct.  Recent labs were suspicious for primary biliary cirrhosis. 2.  Gallstone.  Gallbladder does not look infected.  No bile duct stone. 3.  Numerous enlarged upper abdominal lymph nodes, thought secondary to cirrhosis.  Stable and unchanged compared to previous abdominal CT 07/2022. 4.  Large ventral hernia containing loops of transverse colon and small intestine.  No evidence of bowel obstruction. I recommend continue with plan for EGD and colonoscopy as scheduled.  Also keep follow-up appointment with Dr. Tobi Bastos after procedures as scheduled.  I am forwarding MRI results to Dr. Tobi Bastos for FYI. Celso Amy, PA-C

## 2024-03-03 ENCOUNTER — Inpatient Hospital Stay: Payer: PPO | Attending: Oncology

## 2024-03-03 ENCOUNTER — Inpatient Hospital Stay: Payer: PPO | Admitting: Oncology

## 2024-03-03 ENCOUNTER — Encounter: Payer: Self-pay | Admitting: Oncology

## 2024-03-03 VITALS — BP 131/54 | HR 59 | Temp 98.8°F | Resp 18 | Ht 63.0 in | Wt 238.9 lb

## 2024-03-03 DIAGNOSIS — Z79899 Other long term (current) drug therapy: Secondary | ICD-10-CM | POA: Diagnosis not present

## 2024-03-03 DIAGNOSIS — D649 Anemia, unspecified: Secondary | ICD-10-CM

## 2024-03-03 DIAGNOSIS — Z993 Dependence on wheelchair: Secondary | ICD-10-CM | POA: Diagnosis not present

## 2024-03-03 DIAGNOSIS — D509 Iron deficiency anemia, unspecified: Secondary | ICD-10-CM | POA: Insufficient documentation

## 2024-03-03 DIAGNOSIS — D72820 Lymphocytosis (symptomatic): Secondary | ICD-10-CM

## 2024-03-03 DIAGNOSIS — Z803 Family history of malignant neoplasm of breast: Secondary | ICD-10-CM | POA: Diagnosis not present

## 2024-03-03 LAB — FOLATE: Folate: 26 ng/mL (ref >5.9)

## 2024-03-03 LAB — CBC
HCT: 38.3 % (ref 36.0–46.0)
Hemoglobin: 11.7 g/dL — ABNORMAL LOW (ref 12.0–15.0)
MCH: 27.8 pg (ref 26.0–34.0)
MCHC: 30.5 g/dL (ref 30.0–36.0)
MCV: 91 fL (ref 80.0–100.0)
Platelets: 288 10*3/uL (ref 150–400)
RBC: 4.21 MIL/uL (ref 3.87–5.11)
RDW: 20.1 % — ABNORMAL HIGH (ref 11.5–15.5)
WBC: 10.7 10*3/uL — ABNORMAL HIGH (ref 4.0–10.5)
nRBC: 0 % (ref 0.0–0.2)

## 2024-03-03 LAB — FERRITIN: Ferritin: 59 ng/mL (ref 11–307)

## 2024-03-03 LAB — RETICULOCYTES
Immature Retic Fract: 9.4 % (ref 2.3–15.9)
RBC.: 4.19 MIL/uL (ref 3.87–5.11)
Retic Count, Absolute: 63.7 10*3/uL (ref 19.0–186.0)
Retic Ct Pct: 1.5 % (ref 0.4–3.1)

## 2024-03-03 LAB — VITAMIN B12: Vitamin B-12: 644 pg/mL (ref 180–914)

## 2024-03-03 LAB — IRON AND TIBC
Iron: 46 ug/dL (ref 28–170)
Saturation Ratios: 15 % (ref 10.4–31.8)
TIBC: 316 ug/dL (ref 250–450)
UIBC: 270 ug/dL

## 2024-03-03 NOTE — Progress Notes (Signed)
 Hematology/Oncology Consult note [facility]  [phone] [phone]  Patient Care Team: Marjie Skiff, NP as PCP - General (Nurse Practitioner) Creig Hines, MD as Consulting Physician (Oncology) Alwyn Pea, MD as Consulting Physician (Cardiology) Raechel Chute, MD as Consulting Physician (Pulmonary Disease) Wyline Mood, MD as Consulting Physician (Gastroenterology)   Name of the patient: [name]  [id number]  30-Apr-1956   Date of visit: 03/03/24  Diagnosis-iron deficiency anemia  Chief complaint/ Reason for visit-routine follow-up of iron deficiency anemia  Heme/Onc history: Patient is a 68 year old female who was seen hematology in the past for leukocytosis/lymphocytosis which is thought to be reactive secondary to smoking.  She has had flow cytometry, BCR-ABL FISH testing back in 2021 which were unremarkable.  White cell count mostly fluctuates between 11-16.  Hemoglobin has been normal up until early 2023.  She has now been referred for worsening anemia.  She received 1 dose of Monoferric in February 2025.    Interval history-patient reports improvement in her energy levels after receiving IV iron.  Denies any blood loss in her stool or urine.  Denies any dark melanotic stools  ECOG PS- 2 Pain scale- 0   Review of systems- Review of Systems  Constitutional:  Negative for chills, fever, malaise/fatigue and weight loss.  HENT:  Negative for congestion, ear discharge and nosebleeds.   Eyes:  Negative for blurred vision.  Respiratory:  Negative for cough, hemoptysis, sputum production, shortness of breath and wheezing.   Cardiovascular:  Negative for chest pain, palpitations, orthopnea and claudication.  Gastrointestinal:  Negative for abdominal pain, blood in stool, constipation, diarrhea, heartburn, melena, nausea and vomiting.  Genitourinary:  Negative for dysuria, flank pain, frequency, hematuria and urgency.   Musculoskeletal:  Negative for back pain, joint pain and myalgias.  Skin:  Negative for rash.  Neurological:  Negative for dizziness, tingling, focal weakness, seizures, weakness and headaches.  Endo/Heme/Allergies:  Does not bruise/bleed easily.  Psychiatric/Behavioral:  Negative for depression and suicidal ideas. The patient does not have insomnia.       Allergies  Allergen Reactions   Iodinated Contrast Media Rash    Had rash present to neck with PET scan contrast     Past Medical History:  Diagnosis Date   Anxiety    Aortic atherosclerosis (HCC)    Arthritis    Atrial fibrillation with RVR (HCC)    a.) CHA2DS2VASc = 4 (age, sex, HTN, vascular disease history);  b.) rate/rhythm maintained on oral sotolol; chronically anticoagulated with apixaban   Atypical chest pain    Benign essential tremor    Chronic kidney disease, stage 3a (HCC)    COPD (chronic obstructive pulmonary disease) (HCC)    DDD (degenerative disc disease), cervical    a.) s/p ACDF C6-C7   DDD (degenerative disc disease), lumbar    Enlarged heart    Fatty liver    GERD (gastroesophageal reflux disease)    Hiatal hernia    Hyperlipidemia    Hypertension    Insomnia    a.) takes melatonin PRN   Long term current use of anticoagulant    a.) apixaban   Mass of right adrenal gland (HCC)    a.) CT AP 08/12/2022: measured 3.3 cm   Monocytosis    Morbid obesity (HCC)    Nicotine dependence    OSA on CPAP    Osteoporosis    Pancreatitis    Peripheral vascular disease (HCC)    Psoriasis  SOB (shortness of breath)    Umbilical hernia    Vitamin D deficiency      Past Surgical History:  Procedure Laterality Date   ABDOMINAL HYSTERECTOMY     ACHILLES TENDON REPAIR     Removed bone spur and repaired achilles tendon   ANTERIOR CERVICAL DECOMP/DISCECTOMY FUSION     APPENDECTOMY     COLONOSCOPY WITH PROPOFOL N/A 01/17/2019   Procedure: COLONOSCOPY WITH PROPOFOL;  Surgeon: Wyline Mood, MD;   Location: [facility] ENDOSCOPY;  Service: Gastroenterology;  Laterality: N/A;   FLEXIBLE BRONCHOSCOPY Bilateral 04/09/2023   Procedure: FLEXIBLE BRONCHOSCOPY;  Surgeon: Raechel Chute, MD;  Location: [facility] ORS;  Service: Pulmonary;  Laterality: Bilateral;   TONSILLECTOMY      Social History   Socioeconomic History   Marital status: Widowed    Spouse name: Not on file   Number of children: 1   Years of education: Not on file   Highest education level: GED or equivalent  Occupational History   Occupation: disabled  Tobacco Use   Smoking status: Every Day    Current packs/day: 1.00    Average packs/day: 1 pack/day for 53.3 years (53.3 ttl pk-yrs)    Types: Cigarettes    Start date: 54    Passive exposure: Past   Smokeless tobacco: Never   Tobacco comments:    Smoking 1 ppd.  She has used patches/lozenges and they did not help.  02/09/2024 hfb RN  Vaping Use   Vaping status: Never Used  Substance and Sexual Activity   Alcohol use: No    Alcohol/week: 0.0 standard drinks of alcohol   Drug use: No   Sexual activity: Never    Partners: Male  Other Topics Concern   Not on file  Social History Narrative   Not on file   Social Drivers of Health   Financial Resource Strain: Low Risk  (01/18/2024)   Overall Financial Resource Strain (CARDIA)    Difficulty of Paying Living Expenses: Not hard at all  Food Insecurity: No Food Insecurity (01/18/2024)   Hunger Vital Sign    Worried About Running Out of Food in the Last Year: Never true    Ran Out of Food in the Last Year: Never true  Transportation Needs: No Transportation Needs (01/18/2024)   PRAPARE - Administrator, Civil Service (Medical): No    Lack of Transportation (Non-Medical): No  Physical Activity: Inactive (01/18/2024)   Exercise Vital Sign    Days of Exercise per Week: 0 days    Minutes of Exercise per Session: 0 min  Stress: Stress Concern Present (01/18/2024)   [employer] of Occupational Health -  Occupational Stress Questionnaire    Feeling of Stress : To some extent  Social Connections: Socially Isolated (01/18/2024)   Social Connection and Isolation Panel [NHANES]    Frequency of Communication with Friends and Family: Once a week    Frequency of Social Gatherings with Friends and Family: More than three times a week    Attends Religious Services: Never    Database administrator or Organizations: No    Attends Banker Meetings: Never    Marital Status: Widowed  Intimate Partner Violence: Not At Risk (01/18/2024)   Humiliation, Afraid, Rape, and Kick questionnaire    Fear of Current or Ex-Partner: No    Emotionally Abused: No    Physically Abused: No    Sexually Abused: No    Family History  Problem Relation Age of Onset  COPD Father    Heart disease Father    Hyperlipidemia Father    Hypertension Father    Dementia Father    Cancer Sister    Hyperlipidemia Sister    Hypertension Sister    Lymphoma Sister    Migraines Son    Stroke Maternal Grandfather    Heart attack Brother    Cancer Brother        colorectal and liver   Breast cancer Paternal Aunt    Breast cancer Paternal Aunt      Current Outpatient Medications:    acetaminophen (TYLENOL) 500 MG tablet, Take 1,000 mg by mouth in the morning and at bedtime., Disp: , Rfl:    albuterol (PROAIR HFA) 108 (90 Base) MCG/ACT inhaler, Inhale 2 puffs into the lungs every 6 (six) hours as needed for wheezing or shortness of breath., Disp: 18 g, Rfl: 4   albuterol (PROVENTIL) (2.5 MG/3ML) 0.083% nebulizer solution, Take 3 mLs (2.5 mg total) by nebulization in the morning and at bedtime., Disp: 75 mL, Rfl: 12   apixaban (ELIQUIS) 5 MG TABS tablet, Take 1 tablet (5 mg total) by mouth 2 (two) times daily., Disp: 60 tablet, Rfl: 1   ascorbic acid (VITAMIN C) 500 MG tablet, Take 500 mg by mouth daily., Disp: , Rfl:    atorvastatin (LIPITOR) 80 MG tablet, Take 1 tablet (80 mg total) by mouth daily., Disp: 90  tablet, Rfl: 4   baclofen (LIORESAL) 10 MG tablet, Take 1 tablet (10 mg total) by mouth 3 (three) times daily., Disp: 135 each, Rfl: 4   busPIRone (BUSPAR) 10 MG tablet, TAKE 2 TABLETS BY MOUTH 3 TIMES A DAY, Disp: 540 tablet, Rfl: 1   Calcium Citrate-Vitamin D (CALCIUM + D PO), Take 1,000 mg by mouth daily., Disp: , Rfl:    Cholecalciferol (VITAMIN D) 50 MCG (2000 UT) CAPS, Take by mouth., Disp: , Rfl:    clonazePAM (KLONOPIN) 0.5 MG tablet, Take 1 tablet (0.5 mg total) by mouth at bedtime as needed for anxiety., Disp: 30 tablet, Rfl: 2   Ferrous Sulfate Dried (SM SLOW RELEASE IRON) 143 (45 Fe) MG TBCR, Take 1 tablet by mouth daily., Disp: , Rfl:    fluticasone (FLONASE) 50 MCG/ACT nasal spray, SPRAY 2 SPRAYS INTO EACH NOSTRIL EVERY DAY, Disp: 48 mL, Rfl: 1   furosemide (LASIX) 20 MG tablet, Take 1 tablet (20 mg total) by mouth daily., Disp: 90 tablet, Rfl: 4   gabapentin (NEURONTIN) 800 MG tablet, Take 1 tablet (800 mg total) by mouth in the morning and at bedtime., Disp: 180 tablet, Rfl: 4   hydrOXYzine (ATARAX) 10 MG tablet, Take 1 tablet (10 mg total) by mouth 3 (three) times daily as needed., Disp: 270 tablet, Rfl: 4   Melatonin 10 MG TABS, Take 1 tablet by mouth at bedtime., Disp: , Rfl:    Multiple Vitamins-Minerals (MULTIVITAMIN WITH MINERALS) tablet, Take 1 tablet by mouth daily., Disp: , Rfl:    omeprazole (PRILOSEC) 40 MG capsule, Take 1 capsule (40 mg total) by mouth daily., Disp: 90 capsule, Rfl: 4   potassium chloride SA (KLOR-CON M20) 20 MEQ tablet, Take 1 tablet (20 mEq total) by mouth daily., Disp: 90 tablet, Rfl: 4   sotalol (BETAPACE) 80 MG tablet, Take 80 mg by mouth 2 (two) times daily., Disp: , Rfl:    Tiotropium Bromide-Olodaterol (STIOLTO RESPIMAT) 2.5-2.5 MCG/ACT AERS, Inhale 2 puffs into the lungs daily., Disp: 120 each, Rfl: 12   venlafaxine XR (EFFEXOR-XR) 150 MG 24  hr capsule, Take 1 capsule (150 mg total) by mouth daily with breakfast., Disp: 90 capsule, Rfl: 4    vitamin B-12 (CYANOCOBALAMIN) 1000 MCG tablet, Take 1,000 mcg by mouth daily. (Patient not taking: Reported on 02/15/2024), Disp: , Rfl:   Physical exam:  Vitals:   03/03/24 1359  BP: (!) 131/54  Pulse: (!) 59  Resp: 18  Temp: 98.8 F (37.1 C)  TempSrc: Tympanic  SpO2: 94%  Weight: 238 lb 14.4 oz (108.4 kg)  Height: 5\' 3"  (1.6 m)   Physical Exam Constitutional:      Comments: Sitting in a wheelchair and appears in no acute distress  Cardiovascular:     Rate and Rhythm: Normal rate and regular rhythm.     Heart sounds: Normal heart sounds.  Pulmonary:     Effort: Pulmonary effort is normal.     Breath sounds: Normal breath sounds.  Skin:    General: Skin is warm and dry.  Neurological:     Mental Status: She is alert and oriented to person, place, and time.      I have personally reviewed labs listed below:    Latest Ref Rng & Units 02/07/2024    3:30 PM  CMP  Glucose 70 - 99 mg/dL 696   BUN 8 - 27 mg/dL 14   Creatinine 2.95 - 1.00 mg/dL 2.84   Sodium 132 - 440 mmol/L 143   Potassium 3.5 - 5.2 mmol/L 4.8   Chloride 96 - 106 mmol/L 108   CO2 20 - 29 mmol/L 24   Calcium 8.7 - 10.3 mg/dL 9.5   Total Protein 6.0 - 8.5 g/dL 6.7   Total Bilirubin 0.0 - 1.2 mg/dL <1.0   Alkaline Phos 44 - 121 IU/L 188   AST 0 - 40 IU/L 30   ALT 0 - 32 IU/L 19       Latest Ref Rng & Units 03/03/2024    1:34 PM  CBC  WBC 4.0 - 10.5 K/uL 10.7   Hemoglobin 12.0 - 15.0 g/dL 27.2   Hematocrit 53.6 - 46.0 % 38.3   Platelets 150 - 400 K/uL 288    I have personally reviewed Radiology images listed below: No images are attached to the encounter.  DG Chest 2 View Result Date: 02/21/2024 CLINICAL DATA:  Cough EXAM: CHEST - 2 VIEW COMPARISON:  CT chest February 09, 2024 FINDINGS: Comparison with prior CT there is some minimal residual parenchymal changes of the lingula region which correlates with the previously described infiltrates and atelectasis. Right lung clear Heart and mediastinum normal No  pleural effusions Electronically Signed   By: Shaaron Adler M.D.   On: 02/21/2024 08:37   MR ABDOMEN MRCP W WO CONTAST Result Date: 02/18/2024 CLINICAL DATA:  Right upper quadrant pain nausea,, cirrhosis EXAM: MRI ABDOMEN WITHOUT AND WITH CONTRAST (INCLUDING MRCP) TECHNIQUE: Multiplanar multisequence MR imaging of the abdomen was performed both before and after the administration of intravenous contrast. Heavily T2-weighted images of the biliary and pancreatic ducts were obtained, and three-dimensional MRCP images were rendered by post processing. CONTRAST:  10mL GADAVIST GADOBUTROL 1 MMOL/ML IV SOLN COMPARISON:  CT abdomen pelvis, 08/12/2022 FINDINGS: Lower chest: No acute abnormality. Hepatobiliary: Very coarse, reticular hepatic parenchymal contrast enhancement. Hepatomegaly, maximum coronal span 20.4 cm. Gallstone. No gallbladder wall thickening, or biliary dilatation. Pancreas: Unremarkable. No pancreatic ductal dilatation or surrounding inflammatory changes. Spleen: Normal in size without significant abnormality. Adrenals/Urinary Tract: Definitively benign, macroscopic fat containing right adrenal adenoma requiring no further follow-up  or characterization. Kidneys are normal, without renal calculi, solid lesion, or hydronephrosis. Stomach/Bowel: Stomach is within normal limits. No evidence of bowel wall thickening, distention, or inflammatory changes. Vascular/Lymphatic: Aortic atherosclerosis. Numerous enlarged gastrohepatic ligament, celiac axis, portacaval and porta hepatis lymph nodes, unchanged compared to prior examination. Other: Incompletely imaged large, broad-based midline ventral hernia containing multiple loops of nonobstructed transverse colon and small bowel. No ascites. Musculoskeletal: No acute or significant osseous findings. IMPRESSION: 1. No acute MR findings of the abdomen to explain right upper quadrant pain. Cholelithiasis without evidence of acute cholecystitis. 2. Cirrhosis and  hepatomegaly. No suspicious liver lesion. No biliary ductal dilatation 3. Numerous unchanged enlarged upper abdominal lymph nodes, likely reactive in the setting of cirrhosis. 4. Incompletely imaged large, broad-based midline ventral hernia containing multiple loops of nonobstructed transverse colon and small bowel. Aortic Atherosclerosis (ICD10-I70.0). Electronically Signed   By: Jearld Lesch M.D.   On: 02/18/2024 08:20   MR 3D Recon At Scanner Result Date: 02/18/2024 CLINICAL DATA:  Right upper quadrant pain nausea,, cirrhosis EXAM: MRI ABDOMEN WITHOUT AND WITH CONTRAST (INCLUDING MRCP) TECHNIQUE: Multiplanar multisequence MR imaging of the abdomen was performed both before and after the administration of intravenous contrast. Heavily T2-weighted images of the biliary and pancreatic ducts were obtained, and three-dimensional MRCP images were rendered by post processing. CONTRAST:  10mL GADAVIST GADOBUTROL 1 MMOL/ML IV SOLN COMPARISON:  CT abdomen pelvis, 08/12/2022 FINDINGS: Lower chest: No acute abnormality. Hepatobiliary: Very coarse, reticular hepatic parenchymal contrast enhancement. Hepatomegaly, maximum coronal span 20.4 cm. Gallstone. No gallbladder wall thickening, or biliary dilatation. Pancreas: Unremarkable. No pancreatic ductal dilatation or surrounding inflammatory changes. Spleen: Normal in size without significant abnormality. Adrenals/Urinary Tract: Definitively benign, macroscopic fat containing right adrenal adenoma requiring no further follow-up or characterization. Kidneys are normal, without renal calculi, solid lesion, or hydronephrosis. Stomach/Bowel: Stomach is within normal limits. No evidence of bowel wall thickening, distention, or inflammatory changes. Vascular/Lymphatic: Aortic atherosclerosis. Numerous enlarged gastrohepatic ligament, celiac axis, portacaval and porta hepatis lymph nodes, unchanged compared to prior examination. Other: Incompletely imaged large, broad-based  midline ventral hernia containing multiple loops of nonobstructed transverse colon and small bowel. No ascites. Musculoskeletal: No acute or significant osseous findings. IMPRESSION: 1. No acute MR findings of the abdomen to explain right upper quadrant pain. Cholelithiasis without evidence of acute cholecystitis. 2. Cirrhosis and hepatomegaly. No suspicious liver lesion. No biliary ductal dilatation 3. Numerous unchanged enlarged upper abdominal lymph nodes, likely reactive in the setting of cirrhosis. 4. Incompletely imaged large, broad-based midline ventral hernia containing multiple loops of nonobstructed transverse colon and small bowel. Aortic Atherosclerosis (ICD10-I70.0). Electronically Signed   By: Jearld Lesch M.D.   On: 02/18/2024 08:20     Assessment and plan- Patient is a 68 y.o. female here for routine follow-up of iron deficiency anemia  Patient's hemoglobin has improved to 11.7 from 9.82 months ago after receiving IV iron.  Iron studies from today are pending.  She Has an upcoming appointment with GI in 3 weeks as well.  CBC ferritin and iron studies in 3 and 6 months and I will see her back in 6 months   Visit Diagnosis 1. Iron deficiency anemia, unspecified iron deficiency anemia type      Dr. Owens Shark, MD, MPH [facility] at [facility] [phone] 03/03/2024 2:18 PM

## 2024-03-04 ENCOUNTER — Encounter: Payer: Self-pay | Admitting: Oncology

## 2024-03-05 LAB — HAPTOGLOBIN: Haptoglobin: 231 mg/dL (ref 37–355)

## 2024-03-06 LAB — KAPPA/LAMBDA LIGHT CHAINS
Kappa free light chain: 66.2 mg/L — ABNORMAL HIGH (ref 3.3–19.4)
Kappa, lambda light chain ratio: 1.61 (ref 0.26–1.65)
Lambda free light chains: 41 mg/L — ABNORMAL HIGH (ref 5.7–26.3)

## 2024-03-07 ENCOUNTER — Encounter: Payer: Self-pay | Admitting: Gastroenterology

## 2024-03-07 LAB — MULTIPLE MYELOMA PANEL, SERUM
Albumin SerPl Elph-Mcnc: 2.8 g/dL — ABNORMAL LOW (ref 2.9–4.4)
Albumin/Glob SerPl: 0.9 (ref 0.7–1.7)
Alpha 1: 0.3 g/dL (ref 0.0–0.4)
Alpha2 Glob SerPl Elph-Mcnc: 0.8 g/dL (ref 0.4–1.0)
B-Globulin SerPl Elph-Mcnc: 0.9 g/dL (ref 0.7–1.3)
Gamma Glob SerPl Elph-Mcnc: 1.4 g/dL (ref 0.4–1.8)
Globulin, Total: 3.5 g/dL (ref 2.2–3.9)
IgA: 180 mg/dL (ref 87–352)
IgG (Immunoglobin G), Serum: 1427 mg/dL (ref 586–1602)
IgM (Immunoglobulin M), Srm: 313 mg/dL — ABNORMAL HIGH (ref 26–217)
M Protein SerPl Elph-Mcnc: 0.3 g/dL — ABNORMAL HIGH
Total Protein ELP: 6.3 g/dL (ref 6.0–8.5)

## 2024-03-14 ENCOUNTER — Ambulatory Visit
Admission: RE | Admit: 2024-03-14 | Discharge: 2024-03-14 | Disposition: A | Discharge status: Home / Self Care | Attending: Gastroenterology | Admitting: Gastroenterology

## 2024-03-14 ENCOUNTER — Ambulatory Visit: Admitting: Registered Nurse

## 2024-03-14 ENCOUNTER — Encounter
Admission: RE | Disposition: A | Discharge status: Home / Self Care | Payer: Self-pay | Source: Home / Self Care | Attending: Gastroenterology

## 2024-03-14 ENCOUNTER — Other Ambulatory Visit: Payer: Self-pay

## 2024-03-14 ENCOUNTER — Encounter: Payer: Self-pay | Admitting: Gastroenterology

## 2024-03-14 DIAGNOSIS — I129 Hypertensive chronic kidney disease with stage 1 through stage 4 chronic kidney disease, or unspecified chronic kidney disease: Secondary | ICD-10-CM | POA: Insufficient documentation

## 2024-03-14 DIAGNOSIS — F17210 Nicotine dependence, cigarettes, uncomplicated: Secondary | ICD-10-CM | POA: Diagnosis not present

## 2024-03-14 DIAGNOSIS — K449 Diaphragmatic hernia without obstruction or gangrene: Secondary | ICD-10-CM | POA: Diagnosis not present

## 2024-03-14 DIAGNOSIS — Z981 Arthrodesis status: Secondary | ICD-10-CM | POA: Diagnosis not present

## 2024-03-14 DIAGNOSIS — N183 Chronic kidney disease, stage 3 unspecified: Secondary | ICD-10-CM | POA: Insufficient documentation

## 2024-03-14 DIAGNOSIS — K219 Gastro-esophageal reflux disease without esophagitis: Secondary | ICD-10-CM | POA: Diagnosis not present

## 2024-03-14 DIAGNOSIS — K7460 Unspecified cirrhosis of liver: Secondary | ICD-10-CM | POA: Insufficient documentation

## 2024-03-14 DIAGNOSIS — E669 Obesity, unspecified: Secondary | ICD-10-CM | POA: Insufficient documentation

## 2024-03-14 DIAGNOSIS — R197 Diarrhea, unspecified: Secondary | ICD-10-CM

## 2024-03-14 DIAGNOSIS — K635 Polyp of colon: Secondary | ICD-10-CM | POA: Diagnosis not present

## 2024-03-14 DIAGNOSIS — D509 Iron deficiency anemia, unspecified: Secondary | ICD-10-CM | POA: Diagnosis not present

## 2024-03-14 DIAGNOSIS — K5790 Diverticulosis of intestine, part unspecified, without perforation or abscess without bleeding: Secondary | ICD-10-CM | POA: Diagnosis not present

## 2024-03-14 DIAGNOSIS — D126 Benign neoplasm of colon, unspecified: Secondary | ICD-10-CM

## 2024-03-14 DIAGNOSIS — M1990 Unspecified osteoarthritis, unspecified site: Secondary | ICD-10-CM | POA: Insufficient documentation

## 2024-03-14 DIAGNOSIS — I4891 Unspecified atrial fibrillation: Secondary | ICD-10-CM | POA: Diagnosis not present

## 2024-03-14 DIAGNOSIS — K760 Fatty (change of) liver, not elsewhere classified: Secondary | ICD-10-CM | POA: Insufficient documentation

## 2024-03-14 DIAGNOSIS — Z7901 Long term (current) use of anticoagulants: Secondary | ICD-10-CM | POA: Insufficient documentation

## 2024-03-14 DIAGNOSIS — F419 Anxiety disorder, unspecified: Secondary | ICD-10-CM | POA: Insufficient documentation

## 2024-03-14 DIAGNOSIS — J449 Chronic obstructive pulmonary disease, unspecified: Secondary | ICD-10-CM | POA: Insufficient documentation

## 2024-03-14 DIAGNOSIS — G4730 Sleep apnea, unspecified: Secondary | ICD-10-CM | POA: Diagnosis not present

## 2024-03-14 DIAGNOSIS — K5730 Diverticulosis of large intestine without perforation or abscess without bleeding: Secondary | ICD-10-CM | POA: Diagnosis not present

## 2024-03-14 DIAGNOSIS — F32A Depression, unspecified: Secondary | ICD-10-CM | POA: Diagnosis not present

## 2024-03-14 DIAGNOSIS — Z6841 Body Mass Index (BMI) 40.0 and over, adult: Secondary | ICD-10-CM | POA: Diagnosis not present

## 2024-03-14 DIAGNOSIS — D125 Benign neoplasm of sigmoid colon: Secondary | ICD-10-CM | POA: Diagnosis not present

## 2024-03-14 DIAGNOSIS — I739 Peripheral vascular disease, unspecified: Secondary | ICD-10-CM | POA: Diagnosis not present

## 2024-03-14 HISTORY — PX: POLYPECTOMY: SHX149

## 2024-03-14 HISTORY — PX: COLONOSCOPY: SHX5424

## 2024-03-14 HISTORY — PX: ESOPHAGOGASTRODUODENOSCOPY: SHX5428

## 2024-03-14 SURGERY — EGD (ESOPHAGOGASTRODUODENOSCOPY)
Anesthesia: General

## 2024-03-14 MED ORDER — LIDOCAINE HCL (CARDIAC) PF 100 MG/5ML IV SOSY
PREFILLED_SYRINGE | INTRAVENOUS | Status: DC | PRN
  Administered 2024-03-14: 40 mg via INTRAVENOUS

## 2024-03-14 MED ORDER — DEXMEDETOMIDINE HCL IN NACL 80 MCG/20ML IV SOLN
INTRAVENOUS | Status: DC | PRN
  Administered 2024-03-14 (×2): 4 ug via INTRAVENOUS

## 2024-03-14 MED ORDER — SODIUM CHLORIDE 0.9 % IV SOLN: INTRAVENOUS | Status: DC

## 2024-03-14 MED ORDER — PROPOFOL 500 MG/50ML IV EMUL
INTRAVENOUS | Status: DC | PRN
  Administered 2024-03-14: 75 ug/kg/min via INTRAVENOUS

## 2024-03-14 MED ORDER — PROPOFOL 10 MG/ML IV BOLUS
INTRAVENOUS | Status: DC | PRN
  Administered 2024-03-14: 80 mg via INTRAVENOUS
  Administered 2024-03-14: 10 mg via INTRAVENOUS

## 2024-03-14 NOTE — Anesthesia Postprocedure Evaluation (Signed)
 Anesthesia Post Note  Patient: EVONY REZEK  Procedure(s) Performed: EGD (ESOPHAGOGASTRODUODENOSCOPY) COLONOSCOPY POLYPECTOMY, INTESTINE  Patient location during evaluation: PACU Anesthesia Type: General Level of consciousness: awake and alert, oriented and patient cooperative Pain management: pain level controlled Vital Signs Assessment: post-procedure vital signs reviewed and stable Respiratory status: spontaneous breathing, nonlabored ventilation and respiratory function stable Cardiovascular status: blood pressure returned to baseline and stable Postop Assessment: adequate PO intake Anesthetic complications: no   No notable events documented.   Last Vitals:  Vitals:   03/14/24 1127 03/14/24 1137  BP: (!) 140/81 (!) 150/68  Pulse: 67 65  Resp:    Temp:    SpO2: 99% 99%    Last Pain:  Vitals:   03/14/24 1117  TempSrc: Temporal  PainSc: 6                  Greely Atiyeh

## 2024-03-14 NOTE — Anesthesia Procedure Notes (Signed)
 Date/Time: 03/14/2024 10:47 AM  Performed by: Racheal Buddle, CRNAPre-anesthesia Checklist: Patient identified, Emergency Drugs available, Suction available and Patient being monitored Patient Re-evaluated:Patient Re-evaluated prior to induction Oxygen Delivery Method: Nasal cannula Induction Type: IV induction Dental Injury: Teeth and Oropharynx as per pre-operative assessment  Comments: Nasal cannula with etCO2 monitoring

## 2024-03-14 NOTE — Op Note (Signed)
 [facility] Patient Name: [name] Procedure Date: 03/14/2024 10:38 AM MRN: [id number] Account #: [id number] Date of Birth: 1956/05/01 Admit Type: Outpatient Age: 68 Room: [facility] ENDO ROOM 2 Gender: Female Note Status: Finalized Instrument Name: Charlyn Cooley [id number] Procedure:             Colonoscopy Indications:           Iron deficiency anemia Providers:             Luke Salaam MD, MD Referring MD:          Jolene T. Alton Au (Referring MD) Medicines:             Monitored Anesthesia Care Complications:         No immediate complications. Procedure:             Pre-Anesthesia Assessment:                        - Prior to the procedure, a History and Physical was                         performed, and patient medications, allergies and                         sensitivities were reviewed. The patient's tolerance                         of previous anesthesia was reviewed.                        - The risks and benefits of the procedure and the                         sedation options and risks were discussed with the                         patient. All questions were answered and informed                         consent was obtained.                        - ASA Grade Assessment: II - A patient with mild                         systemic disease.                        After obtaining informed consent, the colonoscope was                         passed under direct vision. Throughout the procedure,                         the patient's blood pressure, pulse, and oxygen                         saturations were monitored continuously. The                         Colonoscope was introduced through the  anus and                         advanced to the the cecum, identified by the                         appendiceal orifice. The colonoscopy was performed                         with ease. The patient tolerated the procedure well.                          The quality of the bowel preparation was good. The                         ileocecal valve, appendiceal orifice, and rectum were                         photographed. Findings:      The perianal and digital rectal examinations were normal.      Multiple medium-mouthed diverticula were found in the left colon.      A 6 mm polyp was found in the sigmoid colon. The polyp was sessile. The       polyp was removed with a cold snare. Resection and retrieval were       complete.      The exam was otherwise without abnormality on direct and retroflexion       views. Impression:            - Diverticulosis in the left colon.                        - One 6 mm polyp in the sigmoid colon, removed with a                         cold snare. Resected and retrieved.                        - The examination was otherwise normal on direct and                         retroflexion views. Recommendation:        - Discharge patient to home (with escort).                        - Resume previous diet.                        - Continue present medications.                        - Await pathology results.                        - Repeat colonoscopy for surveillance based on                         pathology results.                        - Return to GI office  in 8 weeks. Procedure Code(s):     --- Professional ---                        [phone], Colonoscopy, flexible; with removal of                         tumor(s), polyp(s), or other lesion(s) by snare                         technique Diagnosis Code(s):     --- Professional ---                        D50.9, Iron deficiency anemia, unspecified                        D12.5, Benign neoplasm of sigmoid colon                        K57.30, Diverticulosis of large intestine without                         perforation or abscess without bleeding CPT copyright 2022 American Medical Association. All rights reserved. The codes documented in this report are  preliminary and upon coder review may  be revised to meet current compliance requirements. Luke Salaam, MD Luke Salaam MD, MD 03/14/2024 11:15:26 AM This report has been signed electronically. Number of Addenda: 0 Note Initiated On: 03/14/2024 10:38 AM Scope Withdrawal Time: 0 hours 11 minutes 27 seconds  Total Procedure Duration: 0 hours 21 minutes 45 seconds  Estimated Blood Loss:  Estimated blood loss: none.      [facility]

## 2024-03-14 NOTE — Op Note (Signed)
 [facility] Gastroenterology Patient Name: [name] Procedure Date: 03/14/2024 10:39 AM MRN: [id number] Account #: [id number] Date of Birth: March 24, 1956 Admit Type: Outpatient Age: 67 Room: [facility] ENDO ROOM 2 Gender: Female Note Status: Finalized Instrument Name: [name] Endoscope [id number] Procedure:             Upper GI endoscopy Indications:           Iron deficiency anemia Providers:             Luke Salaam MD, MD Referring MD:          Jolene T. Alton Au (Referring MD) Medicines:             Monitored Anesthesia Care Complications:         No immediate complications. Procedure:             Pre-Anesthesia Assessment:                        - Prior to the procedure, a History and Physical was                         performed, and patient medications, allergies and                         sensitivities were reviewed. The patient's tolerance                         of previous anesthesia was reviewed.                        - The risks and benefits of the procedure and the                         sedation options and risks were discussed with the                         patient. All questions were answered and informed                         consent was obtained.                        - ASA Grade Assessment: II - A patient with mild                         systemic disease.                        After obtaining informed consent, the endoscope was                         passed under direct vision. Throughout the procedure,                         the patient's blood pressure, pulse, and oxygen                         saturations were monitored continuously. The Endoscope                         was  introduced through the mouth, and advanced to the                         third part of duodenum. The upper GI endoscopy was                         accomplished with ease. The patient tolerated the                         procedure well. Findings:      The  esophagus was normal.      The stomach was normal.      The examined duodenum was normal. Impression:            - Normal esophagus.                        - Normal stomach.                        - Normal examined duodenum.                        - No specimens collected. Recommendation:        - Perform a colonoscopy today. Procedure Code(s):     --- Professional ---                        [phone], Esophagogastroduodenoscopy, flexible,                         transoral; diagnostic, including collection of                         specimen(s) by brushing or washing, when performed                         (separate procedure) Diagnosis Code(s):     --- Professional ---                        D50.9, Iron deficiency anemia, unspecified CPT copyright 2022 American Medical Association. All rights reserved. The codes documented in this report are preliminary and upon coder review may  be revised to meet current compliance requirements. Luke Salaam, MD Luke Salaam MD, MD 03/14/2024 10:50:45 AM This report has been signed electronically. Number of Addenda: 0 Note Initiated On: 03/14/2024 10:39 AM Estimated Blood Loss:  Estimated blood loss: none.      [facility]

## 2024-03-14 NOTE — Transfer of Care (Signed)
 Immediate Anesthesia Transfer of Care Note  Patient: [name]  Procedure(s) Performed: Procedure(s): EGD (ESOPHAGOGASTRODUODENOSCOPY) (N/A) COLONOSCOPY (N/A) POLYPECTOMY, INTESTINE  Patient Location: PACU and Endoscopy Unit  Anesthesia Type:General  Level of Consciousness: sedated  Airway & Oxygen Therapy: Patient Spontanous Breathing and Patient connected to nasal cannula oxygen  Post-op Assessment: Report given to RN and Post -op Vital signs reviewed and stable  Post vital signs: Reviewed and stable  Last Vitals:  Vitals:   03/14/24 1007 03/14/24 1117  BP: (!) 159/52 (!) 121/93  Pulse: 74   Resp: 18 15  Temp: 36.5 C   SpO2: 97% 95%    Complications: No apparent anesthesia complications

## 2024-03-14 NOTE — H&P (Signed)
 Wyline Mood, MD [address], [place], [place], [address] [address], [place], [place], [address] Phone: [phone]  Fax: [phone]  Primary Care Physician:  Marjie Skiff, NP   Pre-Procedure History & Physical: HPI:  [name] is a 68 y.o. female is here for an endoscopy and colonoscopy    Past Medical History:  Diagnosis Date   Anxiety    Aortic atherosclerosis (HCC)    Arthritis    Atrial fibrillation with RVR (HCC)    a.) CHA2DS2VASc = 4 (age, sex, HTN, vascular disease history);  b.) rate/rhythm maintained on oral sotolol; chronically anticoagulated with apixaban   Atypical chest pain    Benign essential tremor    Chronic kidney disease, stage 3a (HCC)    COPD (chronic obstructive pulmonary disease) (HCC)    DDD (degenerative disc disease), cervical    a.) s/p ACDF C6-C7   DDD (degenerative disc disease), lumbar    Enlarged heart    Fatty liver    GERD (gastroesophageal reflux disease)    Hiatal hernia    Hyperlipidemia    Hypertension    Insomnia    a.) takes melatonin PRN   Long term current use of anticoagulant    a.) apixaban   Mass of right adrenal gland (HCC)    a.) CT AP 08/12/2022: measured 3.3 cm   Monocytosis    Morbid obesity (HCC)    Nicotine dependence    OSA on CPAP    Osteoporosis    Pancreatitis    Peripheral vascular disease (HCC)    Psoriasis    SOB (shortness of breath)    Umbilical hernia    Vitamin D deficiency     Past Surgical History:  Procedure Laterality Date   ABDOMINAL HYSTERECTOMY     ACHILLES TENDON REPAIR     Removed bone spur and repaired achilles tendon   ANTERIOR CERVICAL DECOMP/DISCECTOMY FUSION     APPENDECTOMY     COLONOSCOPY WITH PROPOFOL N/A 01/17/2019   Procedure: COLONOSCOPY WITH PROPOFOL;  Surgeon: Wyline Mood, MD;  Location: [facility] ENDOSCOPY;  Service: Gastroenterology;  Laterality: N/A;   FLEXIBLE BRONCHOSCOPY Bilateral 04/09/2023   Procedure: FLEXIBLE BRONCHOSCOPY;   Surgeon: Raechel Chute, MD;  Location: [facility] ORS;  Service: Pulmonary;  Laterality: Bilateral;   TONSILLECTOMY      Prior to Admission medications   Medication Sig Start Date End Date Taking? Authorizing Provider  acetaminophen (TYLENOL) 500 MG tablet Take 1,000 mg by mouth in the morning and at bedtime.    [provider]  albuterol (PROAIR HFA) 108 (90 Base) MCG/ACT inhaler Inhale 2 puffs into the lungs every 6 (six) hours as needed for wheezing or shortness of breath. 12/02/22   Cannady, Corrie Dandy T, NP  albuterol (PROVENTIL) (2.5 MG/3ML) 0.083% nebulizer solution Take 3 mLs (2.5 mg total) by nebulization in the morning and at bedtime. 08/30/23   Raechel Chute, MD  apixaban (ELIQUIS) 5 MG TABS tablet Take 1 tablet (5 mg total) by mouth 2 (two) times daily. 04/17/22   Enedina Finner, MD  ascorbic acid (VITAMIN C) 500 MG tablet Take 500 mg by mouth daily.    [provider]  atorvastatin (LIPITOR) 80 MG tablet Take 1 tablet (80 mg total) by mouth daily. 02/15/24   Cannady, Corrie Dandy T, NP  baclofen (LIORESAL) 10 MG tablet Take 1 tablet (10 mg total) by mouth 3 (three) times daily. 11/17/23   Cannady, Corrie Dandy T, NP  busPIRone (BUSPAR) 10 MG  tablet TAKE 2 TABLETS BY MOUTH 3 TIMES A DAY 12/07/23   Cannady, Corrie Dandy T, NP  Calcium Citrate-Vitamin D (CALCIUM + D PO) Take 1,000 mg by mouth daily.    [provider]  Cholecalciferol (VITAMIN D) 50 MCG (2000 UT) CAPS Take by mouth.    [provider]  clonazePAM (KLONOPIN) 0.5 MG tablet Take 1 tablet (0.5 mg total) by mouth at bedtime as needed for anxiety. 02/15/24   Cannady, Corrie Dandy T, NP  Ferrous Sulfate Dried (SM SLOW RELEASE IRON) 143 (45 Fe) MG TBCR Take 1 tablet by mouth daily. 12/27/23   [provider]  fluticasone (FLONASE) 50 MCG/ACT nasal spray SPRAY 2 SPRAYS INTO EACH NOSTRIL EVERY DAY 02/15/24   Cannady, Corrie Dandy T, NP  furosemide (LASIX) 20 MG tablet Take 1 tablet (20 mg total) by mouth daily. 02/15/24   Cannady, Corrie Dandy  T, NP  gabapentin (NEURONTIN) 800 MG tablet Take 1 tablet (800 mg total) by mouth in the morning and at bedtime. 08/17/23   Cannady, Corrie Dandy T, NP  hydrOXYzine (ATARAX) 10 MG tablet Take 1 tablet (10 mg total) by mouth 3 (three) times daily as needed. 02/15/24   Cannady, Corrie Dandy T, NP  Melatonin 10 MG TABS Take 1 tablet by mouth at bedtime.    [provider]  Multiple Vitamins-Minerals (MULTIVITAMIN WITH MINERALS) tablet Take 1 tablet by mouth daily.    [provider]  omeprazole (PRILOSEC) 40 MG capsule Take 1 capsule (40 mg total) by mouth daily. 02/15/24   Cannady, Corrie Dandy T, NP  potassium chloride SA (KLOR-CON M20) 20 MEQ tablet Take 1 tablet (20 mEq total) by mouth daily. 02/15/24   Cannady, Corrie Dandy T, NP  sotalol (BETAPACE) 80 MG tablet Take 80 mg by mouth 2 (two) times daily. 08/21/22   [provider]  Tiotropium Bromide-Olodaterol (STIOLTO RESPIMAT) 2.5-2.5 MCG/ACT AERS Inhale 2 puffs into the lungs daily. 08/30/23 08/29/24  Raechel Chute, MD  venlafaxine XR (EFFEXOR-XR) 150 MG 24 hr capsule Take 1 capsule (150 mg total) by mouth daily with breakfast. 02/15/24   Cannady, Corrie Dandy T, NP  vitamin B-12 (CYANOCOBALAMIN) 1000 MCG tablet Take 1,000 mcg by mouth daily. Patient not taking: Reported on 02/15/2024    [provider]    Allergies as of 02/07/2024 - Review Complete 02/07/2024  Allergen Reaction Noted   Iodinated contrast media Rash 05/25/2022    Family History  Problem Relation Age of Onset   COPD Father    Heart disease Father    Hyperlipidemia Father    Hypertension Father    Dementia Father    Cancer Sister    Hyperlipidemia Sister    Hypertension Sister    Lymphoma Sister    Migraines Son    Stroke Maternal Grandfather    Heart attack Brother    Cancer Brother        colorectal and liver   Breast cancer Paternal Aunt    Breast cancer Paternal Aunt     Social History   Socioeconomic History   Marital status: Widowed    Spouse name:  Not on file   Number of children: 1   Years of education: Not on file   Highest education level: GED or equivalent  Occupational History   Occupation: disabled  Tobacco Use   Smoking status: Every Day    Current packs/day: 1.00    Average packs/day: 1 pack/day for 53.3 years (53.3 ttl pk-yrs)    Types: Cigarettes    Start date: 37  Passive exposure: Past   Smokeless tobacco: Never   Tobacco comments:    Smoking 1 ppd.  She has used patches/lozenges and they did not help.  02/09/2024 hfb RN  Vaping Use   Vaping status: Never Used  Substance and Sexual Activity   Alcohol use: No    Alcohol/week: 0.0 standard drinks of alcohol   Drug use: No   Sexual activity: Never    Partners: Male  Other Topics Concern   Not on file  Social History Narrative   Not on file   Social Drivers of Health   Financial Resource Strain: Low Risk  (01/18/2024)   Overall Financial Resource Strain (CARDIA)    Difficulty of Paying Living Expenses: Not hard at all  Food Insecurity: No Food Insecurity (01/18/2024)   Hunger Vital Sign    Worried About Running Out of Food in the Last Year: Never true    Ran Out of Food in the Last Year: Never true  Transportation Needs: No Transportation Needs (01/18/2024)   PRAPARE - Administrator, Civil Service (Medical): No    Lack of Transportation (Non-Medical): No  Physical Activity: Inactive (01/18/2024)   Exercise Vital Sign    Days of Exercise per Week: 0 days    Minutes of Exercise per Session: 0 min  Stress: Stress Concern Present (01/18/2024)   [employer] of Occupational Health - Occupational Stress Questionnaire    Feeling of Stress : To some extent  Social Connections: Socially Isolated (01/18/2024)   Social Connection and Isolation Panel [NHANES]    Frequency of Communication with Friends and Family: Once a week    Frequency of Social Gatherings with Friends and Family: More than three times a week    Attends Religious Services: Never     Database administrator or Organizations: No    Attends Banker Meetings: Never    Marital Status: Widowed  Intimate Partner Violence: Not At Risk (01/18/2024)   Humiliation, Afraid, Rape, and Kick questionnaire    Fear of Current or Ex-Partner: No    Emotionally Abused: No    Physically Abused: No    Sexually Abused: No    Review of Systems: See HPI, otherwise negative ROS  Physical Exam: LMP  (LMP Unknown)  General:   Alert,  pleasant and cooperative in NAD Head:  Normocephalic and atraumatic. Neck:  Supple; no masses or thyromegaly. Lungs:  Clear throughout to auscultation, normal respiratory effort.    Heart:  +S1, +S2, Regular rate and rhythm, No edema. Abdomen:  Soft, nontender and nondistended. Normal bowel sounds, without guarding, and without rebound.   Neurologic:  Alert and  oriented x4;  grossly normal neurologically.  Impression/Plan: [name] is here for an endoscopy and colonoscopy  to be performed for  evaluation of iron deficiency anemia     Risks, benefits, limitations, and alternatives regarding endoscopy have been reviewed with the patient.  Questions have been answered.  All parties agreeable.   Luke Salaam, MD  03/14/2024, 9:50 AM [place]

## 2024-03-14 NOTE — Anesthesia Preprocedure Evaluation (Signed)
 Anesthesia Evaluation  Patient identified by MRN, date of birth, ID band Patient awake    Reviewed: Allergy & Precautions, NPO status , Patient's Chart, lab work & pertinent test results  History of Anesthesia Complications Negative for: history of anesthetic complications  Airway Mallampati: IV   Neck ROM: Full    Dental  (+) Upper Dentures, Lower Dentures   Pulmonary sleep apnea , COPD, Current Smoker (1 ppd)Patient did not abstain from smoking.   Pulmonary exam normal breath sounds clear to auscultation       Cardiovascular hypertension, + Peripheral Vascular Disease  Normal cardiovascular exam+ dysrhythmias (a fib on Eliquis)  Rhythm:Regular Rate:Normal  Myocardial perfusion 06/04/22:  Normal myocardial perfusion scan no evidence of stress-induced  medical ischemia ejection fraction of 67% conclusion negative scan     Neuro/Psych  PSYCHIATRIC DISORDERS Anxiety Depression    S/p ACDF    GI/Hepatic hiatal hernia,GERD  ,,(+) Cirrhosis       Fatty liver   Endo/Other  Obesity   Renal/GU Renal disease (stage III CKD)     Musculoskeletal  (+) Arthritis ,    Abdominal   Peds  Hematology   Anesthesia Other Findings Cardiology note 01/17/24:  Plan  Preop clearance for hip and knee probably hip first patient is in a holding pattern because of excess weight and smoking COPD moderate continue inhalers as necessary advised patient refrain from tobacco abuse Hyperlipidemia continue Lipitor therapy for lipid management Atrial fibrillation paroxysmal maintain on sotalol for rhythm Eliquis for anticoagulation Obesity recommend weight loss exercise portion control consider SGL P2 possibly Ozempic which she has tried before with success Smoking by history advised patient to refrain from tobacco abuse Chronic renal insufficiency stage III continue renal perfusion avoid nephrotoxic drugs consider follow-up with  nephrology Abnormal ultrasound of the liver concerning possibly for NASH or early cirrhosis scheduled to see GI for further evaluation would consider MRI versus biopsy Hypertension poorly controlled diet and exercise consider antihypertensive ACE ARB or amlodipine Edema lower extremities recommend support stockings elevation consider low-dose diuretics Have the patient follow-up in 6 months  Return in about 6 months (around 07/16/2024).    Reproductive/Obstetrics                              Anesthesia Physical Anesthesia Plan  ASA: 3  Anesthesia Plan: General   Post-op Pain Management:    Induction: Intravenous  PONV Risk Score and Plan: 2 and Propofol infusion, TIVA and Treatment may vary due to age or medical condition  Airway Management Planned: Natural Airway  Additional Equipment:   Intra-op Plan:   Post-operative Plan:   Informed Consent: I have reviewed the patients History and Physical, chart, labs and discussed the procedure including the risks, benefits and alternatives for the proposed anesthesia with the patient or authorized representative who has indicated his/her understanding and acceptance.       Plan Discussed with: CRNA  Anesthesia Plan Comments: (LMA/GETA backup discussed.  Patient consented for risks of anesthesia including but not limited to:  - adverse reactions to medications - damage to eyes, teeth, lips or other oral mucosa - nerve damage due to positioning  - sore throat or hoarseness - damage to heart, brain, nerves, lungs, other parts of body or loss of life  Informed patient about role of CRNA in peri- and intra-operative care.  Patient voiced understanding.)         Anesthesia Quick Evaluation

## 2024-03-15 ENCOUNTER — Encounter: Payer: Self-pay | Admitting: Gastroenterology

## 2024-03-15 LAB — SURGICAL PATHOLOGY

## 2024-03-16 DIAGNOSIS — J439 Emphysema, unspecified: Secondary | ICD-10-CM | POA: Diagnosis not present

## 2024-03-19 ENCOUNTER — Encounter: Payer: Self-pay | Admitting: Gastroenterology

## 2024-03-27 ENCOUNTER — Encounter: Payer: Self-pay | Admitting: Gastroenterology

## 2024-03-27 ENCOUNTER — Ambulatory Visit (INDEPENDENT_AMBULATORY_CARE_PROVIDER_SITE_OTHER): Admitting: Gastroenterology

## 2024-03-27 VITALS — BP 148/65 | HR 66 | Temp 98.2°F | Ht 63.0 in | Wt 243.8 lb

## 2024-03-27 DIAGNOSIS — D509 Iron deficiency anemia, unspecified: Secondary | ICD-10-CM | POA: Diagnosis not present

## 2024-03-27 DIAGNOSIS — Z8719 Personal history of other diseases of the digestive system: Secondary | ICD-10-CM

## 2024-03-27 DIAGNOSIS — R768 Other specified abnormal immunological findings in serum: Secondary | ICD-10-CM | POA: Diagnosis not present

## 2024-03-27 DIAGNOSIS — K743 Primary biliary cirrhosis: Secondary | ICD-10-CM

## 2024-03-27 MED ORDER — URSODIOL 500 MG PO TABS: 500.0000 mg | ORAL_TABLET | Freq: Three times a day (TID) | ORAL | 2 refills | Status: AC

## 2024-03-27 NOTE — Patient Instructions (Addendum)
 Please have your labs done in 6 weeks from today (approximately 05/08/2024). Please take the lab orders to a [facility] location and have your labs drawn. No appointment needed.  Dexa scan scheduled June 11, @ 10:20 AM at [facility]. If you need to reschedule, please call Phone: [phone].

## 2024-03-27 NOTE — Progress Notes (Signed)
 Kathleen Salaam MD, MRCP(U.K) [address]  [address]  [place], [place] [address]  Main: [phone]  Fax: [phone]   Primary Care Physician: Kathleen Pyles, NP  Primary Gastroenterologist:  Dr. Luke Garner   Chief Complaint  Patient presents with   IDA    HPI: [name] is a 68 y.o. female  Summary of history :  She was initially seen back in March 2025 by [name] for iron deficiency anemia.  At that point of time an ultrasound of the right upper quadrant showed features concerning for cirrhosis.  Alkaline phosphatase has been elevated since 2023 along with GGT.  Hemoglobin was 9.8 g with MCV of 83 and ferritin of 13 in February 2025.  I performed a colonoscopy back in 2020 with 3 small tubular adenomas resected.  She has been on IV iron infusions, on Eliquis .  She was also on B12 supplementation orally.  She also mention she had chronic diarrhea at that point of time.    Interval history 02/07/2024-4 28 2025  02/18/2024: MRCP: Cirrhosis and hepatomegaly no suspicious liver lesions no biliary ductal dilation numerous unchanged upper abdominal lymph nodes likely related to cirrhosis midline ventral hernia.   02/07/2024: Autoimmune and viral hepatitis: Smooth muscle antibody positive at 23 antimitochondrial antibody positive at 200.  ANA was positive.  Gamma GT positive at 278.  B12 normal at 1729.  Celiac serology negative.  Ferritin 207.  INR 1.0.  Creatinine 0.93 alkaline phosphatase of 188 total bilirubin of less than 0.2  03/14/2024:, EGD: Normal.  Colonoscopy on the same date: Diverticulosis of t tubular adenoma he left colon 6 mm polyp in the sigmoid colon resected with a cold snare. 03/03/2024 hemoglobin 11.7 g ferritin of 59   She is doing well complains of dry skin itching of skin and fatigue no other complaints.  Current Outpatient Medications  Medication Sig Dispense Refill   acetaminophen  (TYLENOL ) 500 MG tablet Take 1,000 mg by mouth in the morning  and at bedtime.     albuterol  (PROAIR  HFA) 108 (90 Base) MCG/ACT inhaler Inhale 2 puffs into the lungs every 6 (six) hours as needed for wheezing or shortness of breath. 18 g 4   albuterol  (PROVENTIL ) (2.5 MG/3ML) 0.083% nebulizer solution Take 3 mLs (2.5 mg total) by nebulization in the morning and at bedtime. 75 mL 12   apixaban  (ELIQUIS ) 5 MG TABS tablet Take 1 tablet (5 mg total) by mouth 2 (two) times daily. 60 tablet 1   ascorbic acid (VITAMIN C) 500 MG tablet Take 500 mg by mouth daily.     atorvastatin  (LIPITOR ) 80 MG tablet Take 1 tablet (80 mg total) by mouth daily. 90 tablet 4   baclofen  (LIORESAL ) 10 MG tablet Take 1 tablet (10 mg total) by mouth 3 (three) times daily. 135 each 4   busPIRone  (BUSPAR ) 10 MG tablet TAKE 2 TABLETS BY MOUTH 3 TIMES A DAY 540 tablet 1   Calcium  Citrate-Vitamin D  (CALCIUM  + D PO) Take 1,000 mg by mouth daily.     Cholecalciferol  (VITAMIN D ) 50 MCG (2000 UT) CAPS Take by mouth.     clonazePAM  (KLONOPIN ) 0.5 MG tablet Take 1 tablet (0.5 mg total) by mouth at bedtime as needed for anxiety. 30 tablet 2   Ferrous Sulfate Dried (SM SLOW RELEASE IRON) 143 (45 Fe) MG TBCR Take 1 tablet by mouth daily.     fluticasone  (FLONASE ) 50 MCG/ACT nasal spray SPRAY 2 SPRAYS INTO EACH NOSTRIL EVERY DAY  48 mL 1   furosemide  (LASIX ) 20 MG tablet Take 1 tablet (20 mg total) by mouth daily. 90 tablet 4   gabapentin  (NEURONTIN ) 800 MG tablet Take 1 tablet (800 mg total) by mouth in the morning and at bedtime. 180 tablet 4   hydrOXYzine  (ATARAX ) 10 MG tablet Take 1 tablet (10 mg total) by mouth 3 (three) times daily as needed. 270 tablet 4   Melatonin 10 MG TABS Take 1 tablet by mouth at bedtime.     Multiple Vitamins-Minerals (MULTIVITAMIN WITH MINERALS) tablet Take 1 tablet by mouth daily.     omeprazole  (PRILOSEC) 40 MG capsule Take 1 capsule (40 mg total) by mouth daily. 90 capsule 4   potassium chloride  SA (KLOR-CON  M20) 20 MEQ tablet Take 1 tablet (20 mEq total) by mouth  daily. 90 tablet 4   sotalol (BETAPACE) 80 MG tablet Take 80 mg by mouth 2 (two) times daily.     Tiotropium Bromide -Olodaterol (STIOLTO RESPIMAT ) 2.5-2.5 MCG/ACT AERS Inhale 2 puffs into the lungs daily. 120 each 12   ursodiol (URSO FORTE) 500 MG tablet Take 1 tablet (500 mg total) by mouth 3 (three) times daily. 270 tablet 2   venlafaxine  XR (EFFEXOR -XR) 150 MG 24 hr capsule Take 1 capsule (150 mg total) by mouth daily with breakfast. 90 capsule 4   vitamin B-12 (CYANOCOBALAMIN ) 1000 MCG tablet Take 1,000 mcg by mouth daily.     No current facility-administered medications for this visit.    Allergies as of 03/27/2024 - Review Complete 03/27/2024  Allergen Reaction Noted   Iodinated contrast media Rash 05/25/2022      ROS:  General: Negative for anorexia, weight loss, fever, chills, fatigue, weakness. ENT: Negative for hoarseness, difficulty swallowing , nasal congestion. CV: Negative for chest pain, angina, palpitations, dyspnea on exertion, peripheral edema.  Respiratory: Negative for dyspnea at rest, dyspnea on exertion, cough, sputum, wheezing.  GI: See history of present illness. GU:  Negative for dysuria, hematuria, urinary incontinence, urinary frequency, nocturnal urination.  Endo: Negative for unusual weight change.    Physical Examination:   BP (!) 148/65   Pulse 66   Temp 98.2 F (36.8 C)   Ht 5\' 3"  (1.6 m)   Wt 243 lb 12.8 oz (110.6 kg)   LMP  (LMP Unknown)   BMI 43.19 kg/m   General: Well-nourished, well-developed in no acute distress.  Eyes: No icterus. Conjunctivae pink. Neuro: Alert and oriented x 3.  Grossly intact. Skin: Warm and dry, no jaundice.   Psych: Alert and cooperative, normal mood and affect.   Imaging Studies: No results found.  Assessment and Plan:   [name] is a 68 y.o. y/o female here to follow-up for iron deficiency anemia and history of liver cirrhosis not worked up previously recent evaluation is suggestive of possible  primary biliary cholangitis.  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  Plan 1.  Full viral hepatitis panel to complete evaluation for liver cirrhosis 2.  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 3.  Right upper quadrant ultrasound  in 07/19/2024 seen for [facility] 4.  Vaccination will be discussed at next visit based on hepatitis panel.  Today we will administer pneumococcal vaccine 5.  Check H. pylori breath test and urinalysis to complete workup for iron deficiency anemia.  Presently being followed by hematology.  If her hemoglobin remained stable no further workup required from the GI point of view but if ferritin levels drop we could consider capsule study of the small bowel 6.  At follow-up visit we will need follow-up on health maintenance issues for primary biliary cholangitis as per Society guidelines such as checking vitamin a level D level and prothrombin time annually .  I will start by obtaining a DEXA bone after this visit and will require 1 every 2 years scan check calcium  and vitamin D  levels to screen for osteoporosis 7. No nsaids   Dr Kathleen Salaam  MD,MRCP [facility]) Follow up in 3 months

## 2024-04-15 DIAGNOSIS — J439 Emphysema, unspecified: Secondary | ICD-10-CM | POA: Diagnosis not present

## 2024-05-10 ENCOUNTER — Ambulatory Visit
Admission: RE | Admit: 2024-05-10 | Discharge: 2024-05-10 | Disposition: A | Discharge status: Home / Self Care | Source: Ambulatory Visit | Attending: Gastroenterology | Admitting: Gastroenterology

## 2024-05-10 DIAGNOSIS — Z13820 Encounter for screening for osteoporosis: Secondary | ICD-10-CM | POA: Insufficient documentation

## 2024-05-10 DIAGNOSIS — Z780 Asymptomatic menopausal state: Secondary | ICD-10-CM | POA: Diagnosis not present

## 2024-05-10 DIAGNOSIS — M85852 Other specified disorders of bone density and structure, left thigh: Secondary | ICD-10-CM | POA: Diagnosis not present

## 2024-05-10 DIAGNOSIS — M85851 Other specified disorders of bone density and structure, right thigh: Secondary | ICD-10-CM | POA: Diagnosis not present

## 2024-05-10 DIAGNOSIS — D509 Iron deficiency anemia, unspecified: Secondary | ICD-10-CM | POA: Insufficient documentation

## 2024-05-10 DIAGNOSIS — M8589 Other specified disorders of bone density and structure, multiple sites: Secondary | ICD-10-CM | POA: Insufficient documentation

## 2024-05-10 DIAGNOSIS — K743 Primary biliary cirrhosis: Secondary | ICD-10-CM | POA: Insufficient documentation

## 2024-05-11 ENCOUNTER — Ambulatory Visit: Payer: Self-pay | Admitting: Gastroenterology

## 2024-05-16 ENCOUNTER — Encounter

## 2024-05-16 ENCOUNTER — Other Ambulatory Visit: Payer: Self-pay | Admitting: Nurse Practitioner

## 2024-05-16 DIAGNOSIS — J439 Emphysema, unspecified: Secondary | ICD-10-CM | POA: Diagnosis not present

## 2024-05-18 NOTE — Telephone Encounter (Signed)
 Requested medications are due for refill today.  yes  Requested medications are on the active medications list.  yes  Last refill. 02/15/2024 #30 2 rf  Future visit scheduled.   yes  Notes to clinic.  Refill not delegated.    Requested Prescriptions  Pending Prescriptions Disp Refills   clonazePAM  (KLONOPIN ) 0.5 MG tablet [Pharmacy Med Name: CLONAZEPAM  0.5 MG TABLET] 30 tablet 2    Sig: TAKE 1 TABLET BY MOUTH EVERY DAY AS NEEDED FOR ANXIETY     Not Delegated - Psychiatry: Anxiolytics/Hypnotics 2 Failed - 05/18/2024  4:23 PM      Failed - This refill cannot be delegated      Failed - Urine Drug Screen completed in last 360 days      Passed - Patient is not pregnant      Passed - Valid encounter within last 6 months    Recent Outpatient Visits           3 months ago Centrilobular emphysema (HCC)   [facility] [place], Lavelle Posey, NP

## 2024-05-20 NOTE — Patient Instructions (Signed)
 Be Involved in Caring For Your Health:  Taking Medications When medications are taken as directed, they can greatly improve your health. But if they are not taken as prescribed, they may not work. In some cases, not taking them correctly can be harmful. To help ensure your treatment remains effective and safe, understand your medications and how to take them. Bring your medications to each visit for review by your provider.  Your lab results, notes, and after visit summary will be available on My Chart. We strongly encourage you to use this feature. If lab results are abnormal the clinic will contact you with the appropriate steps. If the clinic does not contact you assume the results are satisfactory. You can always view your results on My Chart. If you have questions regarding your health or results, please contact the clinic during office hours. You can also ask questions on My Chart.  We at [facility] are grateful that you chose [place] to provide your care. We strive to provide evidence-based and compassionate care and are always looking for feedback. If you get a survey from the clinic please complete this so we can hear your opinions.  Managing Anxiety, Adult After being diagnosed with anxiety, you may be relieved to know why you have felt or behaved a certain way. You may also feel overwhelmed about the treatment ahead and what it will mean for your life. With care and support, you can manage your anxiety. How to manage lifestyle changes Understanding the difference between stress and anxiety Although stress can play a role in anxiety, it is not the same as anxiety. Stress is your body's reaction to life changes and events, both good and bad. Stress is often caused by something external, such as a deadline, test, or competition. It normally goes away after the event has ended and will last just a few hours. But, stress can be ongoing and can lead to more than just stress. Anxiety is  caused by something internal, such as imagining a terrible outcome or worrying that something will go wrong that will greatly upset you. Anxiety often does not go away even after the event is over, and it can become a long-term (chronic) worry. Lowering stress and anxiety Talk with your health care provider or a counselor to learn more about lowering anxiety and stress. They may suggest tension-reduction techniques, such as: Music. Spend time creating or listening to music that you enjoy and that inspires you. Mindfulness-based meditation. Practice being aware of your normal breaths while not trying to control your breathing. It can be done while sitting or walking. Centering prayer. Focus on a word, phrase, or sacred image that means something to you and brings you peace. Deep breathing. Expand your stomach and inhale slowly through your nose. Hold your breath for 3-5 seconds. Then breathe out slowly, letting your stomach muscles relax. Self-talk. Learn to notice and spot thought patterns that lead to anxiety reactions. Change those patterns to thoughts that feel peaceful. Muscle relaxation. Take time to tense muscles and then relax them. Choose a tension-reduction technique that fits your lifestyle and personality. These techniques take time and practice. Set aside 5-15 minutes a day to do them. Specialized therapists can offer counseling and training in these techniques. The training to help with anxiety may be covered by some insurance plans. Other things you can do to manage stress and anxiety include: Keeping a stress diary. This can help you learn what triggers your reaction and then learn ways  to manage your response. Thinking about how you react to certain situations. You may not be able to control everything, but you can control your response. Making time for activities that help you relax and not feeling guilty about spending your time in this way. Doing visual imagery. This involves  imagining or creating mental pictures to help you relax. Practicing yoga. Through yoga poses, you can lower tension and relax.  Medicines Medicines for anxiety include: Antidepressant medicines. These are usually prescribed for long-term daily control. Anti-anxiety medicines. These may be added in severe cases, especially when panic attacks occur. When used together, medicines, psychotherapy, and tension-reduction techniques may be the most effective treatment. Relationships Relationships can play a big part in helping you recover. Spend more time connecting with trusted friends and family members. Think about going to couples counseling if you have a partner, taking family education classes, or going to family therapy. Therapy can help you and others better understand your anxiety. How to recognize changes in your anxiety Everyone responds differently to treatment for anxiety. Recovery from anxiety happens when symptoms lessen and stop interfering with your daily life at home or work. This may mean that you will start to: Have better concentration and focus. Worry will interfere less in your daily thinking. Sleep better. Be less irritable. Have more energy. Have improved memory. Try to recognize when your condition is getting worse. Contact your provider if your symptoms interfere with home or work and you feel like your condition is not improving. Follow these instructions at home: Activity Exercise. Adults should: Exercise for at least 150 minutes each week. The exercise should increase your heart rate and make you sweat (moderate-intensity exercise). Do strengthening exercises at least twice a week. Get the right amount and quality of sleep. Most adults need 7-9 hours of sleep each night. Lifestyle  Eat a healthy diet that includes plenty of vegetables, fruits, whole grains, low-fat dairy products, and lean protein. Do not eat a lot of foods that are high in fats, added sugars, or salt  (sodium). Make choices that simplify your life. Do not use any products that contain nicotine or tobacco. These products include cigarettes, chewing tobacco, and vaping devices, such as e-cigarettes. If you need help quitting, ask your provider. Avoid caffeine, alcohol, and certain over-the-counter cold medicines. These may make you feel worse. Ask your pharmacist which medicines to avoid. General instructions Take over-the-counter and prescription medicines only as told by your provider. Keep all follow-up visits. This is to make sure you are managing your anxiety well or if you need more support. Where to find support You can get help and support from: Self-help groups. Online and [employer]. A trusted spiritual leader. Couples counseling. Family education classes. Family therapy. Where to find more information You may find that joining a support group helps you deal with your anxiety. The following sources can help you find counselors or support groups near you: Mental Health [place]: mentalhealthamerica.net Anxiety and Depression Association of [place] (ADAA): adaa.org The [employer] on Mental Illness (NAMI): nami.org Contact a health care provider if: You have a hard time staying focused or finishing tasks. You spend many hours a day feeling worried about everyday life. You are very tired because you cannot stop worrying. You start to have headaches or often feel tense. You have chronic nausea or diarrhea. Get help right away if: Your heart feels like it is racing. You have shortness of breath. You have thoughts of hurting yourself or others. Get help  right away if you feel like you may hurt yourself or others, or have thoughts about taking your own life. Go to your nearest emergency room or: Call 911. Call the National Suicide Prevention Lifeline at 765-482-1593 or 988. This is open 24 hours a day. Text the Crisis Text Line at 504 124 9896. This information is not  intended to replace advice given to you by your health care provider. Make sure you discuss any questions you have with your health care provider. Document Revised: 08/25/2022 Document Reviewed: 03/09/2021 Elsevier Patient Education  2024 [employer].

## 2024-05-22 ENCOUNTER — Ambulatory Visit (INDEPENDENT_AMBULATORY_CARE_PROVIDER_SITE_OTHER): Admitting: Nurse Practitioner

## 2024-05-22 ENCOUNTER — Encounter: Payer: Self-pay | Admitting: Nurse Practitioner

## 2024-05-22 ENCOUNTER — Ambulatory Visit: Payer: Self-pay | Admitting: Nurse Practitioner

## 2024-05-22 ENCOUNTER — Other Ambulatory Visit

## 2024-05-22 VITALS — BP 128/70 | HR 67 | Temp 97.9°F | Ht 63.0 in | Wt 256.6 lb

## 2024-05-22 DIAGNOSIS — F419 Anxiety disorder, unspecified: Secondary | ICD-10-CM

## 2024-05-22 DIAGNOSIS — Z79899 Other long term (current) drug therapy: Secondary | ICD-10-CM | POA: Insufficient documentation

## 2024-05-22 DIAGNOSIS — M25512 Pain in left shoulder: Secondary | ICD-10-CM | POA: Diagnosis not present

## 2024-05-22 DIAGNOSIS — M5416 Radiculopathy, lumbar region: Secondary | ICD-10-CM

## 2024-05-22 DIAGNOSIS — F17210 Nicotine dependence, cigarettes, uncomplicated: Secondary | ICD-10-CM

## 2024-05-22 DIAGNOSIS — F324 Major depressive disorder, single episode, in partial remission: Secondary | ICD-10-CM | POA: Diagnosis not present

## 2024-05-22 DIAGNOSIS — G8929 Other chronic pain: Secondary | ICD-10-CM | POA: Diagnosis not present

## 2024-05-22 DIAGNOSIS — K743 Primary biliary cirrhosis: Secondary | ICD-10-CM | POA: Insufficient documentation

## 2024-05-22 LAB — URINALYSIS, ROUTINE W REFLEX MICROSCOPIC
Bilirubin, UA: NEGATIVE
Glucose, UA: NEGATIVE
Ketones, UA: NEGATIVE
Leukocytes,UA: NEGATIVE
Nitrite, UA: NEGATIVE
Protein,UA: NEGATIVE
RBC, UA: NEGATIVE
Specific Gravity, UA: 1.01 (ref 1.005–1.030)
Urobilinogen, Ur: 0.2 mg/dL (ref 0.2–1.0)
pH, UA: 6 (ref 5.0–7.5)

## 2024-05-22 MED ORDER — BUSPIRONE HCL 10 MG PO TABS: 20.0000 mg | ORAL_TABLET | Freq: Three times a day (TID) | ORAL | 2 refills | Status: AC

## 2024-05-22 NOTE — Assessment & Plan Note (Signed)
 Chronic, ongoing. Followed by GI, continue this collaboration.  Obtain labs for them today and will send results to Dr. Therisa.

## 2024-05-22 NOTE — Progress Notes (Signed)
 BP 128/70 (BP Location: Left Arm, Patient Position: Sitting, Cuff Size: Large)   Pulse 67   Temp 97.9 F (36.6 C) (Oral)   Ht 5' 3 (1.6 m)   Wt 256 lb 9.6 oz (116.4 kg)   LMP  (LMP Unknown)   SpO2 92%   BMI 45.45 kg/m    Subjective:    Patient ID: [name], female    DOB: May 08, 1956, 68 y.o.   MRN: [id number]  HPI: [name] is a 68 y.o. female  Chief Complaint  Patient presents with   Anxiety   Having numbness to left 4th and 5th fingers.  Does have chronic left shoulder pain that goes down arm.  Had shoulder injection on 12/09/23 with ortho, but it did not offer much benefit.  Also have left leg weakness at baseline.   Needs labs drawn for Dr. Therisa with GI.  ANXIETY/STRESS Continues Effexor  XR 150 MG daily which has offered benefit, took Duloxetine .  Continues on Buspar  20 MG TID + Clonazepam  0.5 MG daily PRN, uses Klonopin  once a day due to stressors -- uses at night when it is worse. Is not seeing pain clinic any longer due to taking benzo.  Pt is aware of risks of psychoactive medication use to include increased sedation, respiratory suppression, falls, dependence and cardiovascular events.  Pt would like to continue treatment as benefit determined to outweigh risk.  PDMP last fill 05/19/24.  Duration:stable Anxious mood: yes  Excessive worrying: yes somedays Irritability: no  Sweating: no Nausea: no Palpitations:no Hyperventilation: no Panic attacks: yes about 4 days, when gets feels like can not breath Agoraphobia: no  Obscessions/compulsions: no Depressed mood: no    05/22/2024    2:47 PM 02/15/2024    2:26 PM 01/18/2024    2:05 PM 11/17/2023    3:08 PM 08/17/2023    2:47 PM  Depression screen PHQ 2/9  Decreased Interest 0 0 0 0 0  Down, Depressed, Hopeless 0 0 0 0 0  PHQ - 2 Score 0 0 0 0 0  Altered sleeping 0 0  0 0  Tired, decreased energy 0 0  0 0  Change in appetite 0 0  0 0  Feeling bad or failure about yourself  0 0  0 0  Trouble  concentrating 0 0  0 0  Moving slowly or fidgety/restless 0 0  0 0  Suicidal thoughts 0 0  0 0  PHQ-9 Score 0 0  0 0  Difficult doing work/chores Not difficult at all Not difficult at all  Not difficult at all Not difficult at all  Anhedonia: no Weight changes: no Insomnia: yes hard to fall asleep  Hypersomnia: no Fatigue/loss of energy: yes Feelings of worthlessness: no Feelings of guilt: no Impaired concentration/indecisiveness: no Suicidal ideations: no  Crying spells: yes Recent Stressors/Life Changes: yes   Relationship problems: no   Family stress: yes  -- son had recent MI and her own health   Financial stress: no    Job stress: no    Recent death/loss: no   Relevant past medical, surgical, family and social history reviewed and updated as indicated. Interim medical history since our last visit reviewed. Allergies and medications reviewed and updated.  Review of Systems  Constitutional:  Negative for activity change, appetite change, diaphoresis, fatigue and fever.  HENT:  Negative for ear discharge and ear pain.   Respiratory:  Negative for cough, chest tightness, shortness of breath and wheezing.   Cardiovascular:  Negative for chest pain, palpitations and leg swelling.  Gastrointestinal: Negative.   Musculoskeletal:  Positive for arthralgias and back pain.  Neurological: Negative.   Psychiatric/Behavioral:  Negative for decreased concentration, self-injury, sleep disturbance and suicidal ideas. The patient is not nervous/anxious.     Per HPI unless specifically indicated above     Objective:    BP 128/70 (BP Location: Left Arm, Patient Position: Sitting, Cuff Size: Large)   Pulse 67   Temp 97.9 F (36.6 C) (Oral)   Ht 5' 3 (1.6 m)   Wt 256 lb 9.6 oz (116.4 kg)   LMP  (LMP Unknown)   SpO2 92%   BMI 45.45 kg/m   Wt Readings from Last 3 Encounters:  05/22/24 256 lb 9.6 oz (116.4 kg)  03/27/24 243 lb 12.8 oz (110.6 kg)  03/14/24 235 lb (106.6 kg)     Physical Exam Vitals and nursing note reviewed.  Constitutional:      General: She is awake. She is not in acute distress.    Appearance: She is well-developed and well-groomed. She is obese. She is not ill-appearing or toxic-appearing.  HENT:     Head: Normocephalic.     Right Ear: Hearing and external ear normal.     Left Ear: Hearing and external ear normal.   Eyes:     General: Lids are normal.        Right eye: No discharge.        Left eye: No discharge.     Conjunctiva/sclera: Conjunctivae normal.     Pupils: Pupils are equal, round, and reactive to light.   Neck:     Thyroid : No thyromegaly.     Vascular: No carotid bruit.   Cardiovascular:     Rate and Rhythm: Normal rate and regular rhythm.     Heart sounds: Normal heart sounds. No murmur heard.    No gallop.  Pulmonary:     Effort: Pulmonary effort is normal. No accessory muscle usage or respiratory distress.     Breath sounds: Normal breath sounds.  Abdominal:     General: Bowel sounds are normal. There is no distension.     Palpations: Abdomen is soft.     Tenderness: There is no abdominal tenderness.   Musculoskeletal:     Cervical back: Normal range of motion and neck supple.     Right lower leg: 1+ Pitting Edema present.     Left lower leg: 1+ Pitting Edema present.  Lymphadenopathy:     Cervical: No cervical adenopathy.   Skin:    General: Skin is warm and dry.   Neurological:     Mental Status: She is alert and oriented to person, place, and time.     Deep Tendon Reflexes: Reflexes are normal and symmetric.     Reflex Scores:      Brachioradialis reflexes are 2+ on the right side and 2+ on the left side.      Patellar reflexes are 2+ on the right side and 2+ on the left side.  Psychiatric:        Attention and Perception: Attention normal.        Mood and Affect: Mood normal.        Speech: Speech normal.        Behavior: Behavior normal. Behavior is cooperative.        Thought Content:  Thought content normal.    Results for orders placed or performed during the hospital encounter of 03/14/24  Surgical pathology  Collection Time: 03/14/24 12:00 AM  Result Value Ref Range   SURGICAL PATHOLOGY      SURGICAL PATHOLOGY [facility] [address] [place], [place] [address] Telephone [phone] or [phone] Fax [phone]  REPORT OF SURGICAL PATHOLOGY   Accession #: [id number] Patient Name: [name], [name] Visit # : [id number]  MRN: [id number] Physician: Therisa Bi DOB/Age [birth year] (Age: 55) Gender: F Collected Date: 03/14/2024 Received Date: 03/14/2024  FINAL DIAGNOSIS       1. Sigmoid  Colon Polyp, cold snare :       - TUBULAR ADENOMA(S)      - NEGATIVE FOR HIGH-GRADE DYSPLASIA OR MALIGNANCY       DATE SIGNED OUT: 03/15/2024 ELECTRONIC SIGNATURE : Rebbecca Md, Nilesh, Pathologist, Electronic Signature  MICROSCOPIC DESCRIPTION  CASE COMMENTS STAINS USED IN DIAGNOSIS: H&E    CLINICAL HISTORY  SPECIMEN(S) OBTAINED 1. Sigmoid  Colon Polyp, Cold Snare  SPECIMEN COMMENTS: SPECIMEN CLINICAL INFORMATION: 1. Diarrhea, IDA, normal EGD, diverticulosis, colon polyp    Gross Description 1. Sigmoid colon  polyp cold snare, received in formalin is a 1.0 x 0.5 x 0.1 cm aggregate of 4 tan-white tissue fragments. The specimen is submitted in toto in 1 block (1A).      [facility] 03/14/2024        Report signed out from the following location(s) [place].  H[facility] [address] [place], [place] [address] CLIA #: [id number]  [facility] [address] [place], [place] [address] CLIA #: [id number]       Assessment & Plan:   Problem List Items Addressed This Visit       Digestive   Primary biliary cholangitis (HCC)   Chronic, ongoing. Followed by GI, continue this collaboration.  Obtain labs for them today and will send results to Dr. Therisa.      Relevant Orders   TSH    Urinalysis, Routine w reflex microscopic   VITAMIN D  25 Hydroxy (Vit-D Deficiency, Fractures)   Hepatitis A Ab, Total   Hepatitis B Surface AntiBODY   Hepatitis C antibody   Hepatitis B Surface AntiGEN   H. pylori antigen, stool     Other   Nicotine  dependence, cigarettes, uncomplicated   I have recommended complete cessation of tobacco use. I have discussed various options available for assistance with tobacco cessation including over the counter methods (Nicotine  gum, patch and lozenges). We also discussed prescription options (Chantix, Nicotine  Inhaler / Nasal Spray). The patient is not interested in pursuing any prescription tobacco cessation options at this time.  Continue annual lung screening.       Morbid obesity (HCC)   BMI 45.45 with HTN and COPD, A-Fib.  Recommended eating smaller high protein, low fat meals more frequently and exercising 30 mins a day 5 times a week with a goal of 10-15lb weight loss in the next 3 months. Patient voiced their understanding and motivation to adhere to these recommendations.  Tolerated Ozempic  with total 68 pounds loss, but insurance stopped covering and she is now gaining weight back. Will attempt to get her back on Semaglutide  in future as offered a lot of benefit to her overall health.       Long-term current use of benzodiazepine   Refer to depression plan of care.      Relevant Orders   [id number] 11+Oxyco+Alc+Crt-Bund   Depression, major, single episode, in partial remission (HCC) - Primary   Chronic, stable at this time. Denies SI/HI. Continue  Effexor  XL 150 MG daily + Buspar  and Vistaril .  Continue at lowest dosing, Klonopin  0.5 MG daily as needed only -- take only as needed.  Aware of need for every 3 month visit, yearly drug screen, and controlled substance agreement.  Controlled substance agreement up to date and UDS due 05/16/24.  Consider Prozac , as worked well in past, but this could interact with cardiac medications (QT prolongation) -  will not initiate this at this time.  Consider psychiatry referral in future.  Monitor BP with Effexor .      Relevant Medications   busPIRone  (BUSPAR ) 10 MG tablet   Chronic left shoulder pain   Chronic and ongoing.  Significant decrease in ROM on exam.  Has tried simple treatment at home with no benefit and had injection.  Continue current home regimen at this time. Return to ortho as needed. Referral to PT for both left shoulder and left leg.      Relevant Orders   Ambulatory referral to Physical Therapy   Ambulatory referral to Occupational Therapy   Anxiety   Refer to depression plan of care.      Relevant Medications   busPIRone  (BUSPAR ) 10 MG tablet   Other Relevant Orders   [id number] 11+Oxyco+Alc+Crt-Bund     Follow up plan: Return in about 3 months (around 08/22/2024) for HTN/HLD, COPD, MOOD, CIRRHOSIS.

## 2024-05-22 NOTE — Assessment & Plan Note (Signed)
I have recommended complete cessation of tobacco use. I have discussed various options available for assistance with tobacco cessation including over the counter methods (Nicotine gum, patch and lozenges). We also discussed prescription options (Chantix, Nicotine Inhaler / Nasal Spray). The patient is not interested in pursuing any prescription tobacco cessation options at this time.  Continue annual lung screening. 

## 2024-05-22 NOTE — Assessment & Plan Note (Signed)
 BMI 45.45 with HTN and COPD, A-Fib.  Recommended eating smaller high protein, low fat meals more frequently and exercising 30 mins a day 5 times a week with a goal of 10-15lb weight loss in the next 3 months. Patient voiced their understanding and motivation to adhere to these recommendations.  Tolerated Ozempic  with total 68 pounds loss, but insurance stopped covering and she is now gaining weight back. Will attempt to get her back on Semaglutide  in future as offered a lot of benefit to her overall health.

## 2024-05-22 NOTE — Assessment & Plan Note (Signed)
 Refer to depression plan of care.

## 2024-05-22 NOTE — Assessment & Plan Note (Signed)
Chronic, stable at this time. Denies SI/HI.  Continue Effexor XL 150 MG daily + Buspar and Vistaril.  Continue at lowest dosing, Klonopin 0.5 MG daily as needed only -- take only as needed.  Aware of need for every 3 month visit, yearly drug screen, and controlled substance agreement.  Controlled substance agreement up to date and UDS due 05/16/24.  Consider Prozac, as worked well in past, but this could interact with cardiac medications (QT prolongation) - will not initiate this at this time.  Consider psychiatry referral in future.  Monitor BP with Effexor.

## 2024-05-22 NOTE — Assessment & Plan Note (Signed)
 Chronic and ongoing.  Significant decrease in ROM on exam.  Has tried simple treatment at home with no benefit and had injection.  Continue current home regimen at this time. Return to ortho as needed. Referral to PT for both left shoulder and left leg.

## 2024-05-23 LAB — HEPATITIS B SURFACE ANTIGEN: Hepatitis B Surface Ag: NEGATIVE

## 2024-05-23 LAB — VITAMIN D 25 HYDROXY (VIT D DEFICIENCY, FRACTURES): Vit D, 25-Hydroxy: 52 ng/mL (ref 30.0–100.0)

## 2024-05-23 LAB — HEPATITIS A ANTIBODY, TOTAL: hep A Total Ab: POSITIVE — AB

## 2024-05-23 LAB — TSH: TSH: 2.63 u[IU]/mL (ref 0.450–4.500)

## 2024-05-23 LAB — HEPATITIS B SURFACE ANTIBODY,QUALITATIVE: Hep B Surface Ab, Qual: NONREACTIVE

## 2024-05-23 LAB — HEPATITIS C ANTIBODY: Hep C Virus Ab: NONREACTIVE

## 2024-05-24 LAB — DRUG SCREEN 764883 11+OXYCO+ALC+CRT-BUND
Amphetamines, Urine: NEGATIVE ng/mL
BENZODIAZ UR QL: NEGATIVE ng/mL
Barbiturate screen, urine: NEGATIVE ng/mL
Cannabinoid Quant, Ur: NEGATIVE ng/mL
Cocaine (Metab.): NEGATIVE ng/mL
Creatinine, Urine: 48.2 mg/dL (ref 20.0–300.0)
Ethanol, Urine: NEGATIVE %
Meperidine: NEGATIVE ng/mL
Methadone Screen, Urine: NEGATIVE ng/mL
Nitrite Urine, Quantitative: NEGATIVE ug/mL
OPIATE SCREEN URINE: NEGATIVE ng/mL
Oxycodone/Oxymorphone, Urine: NEGATIVE ng/mL
PCP Quant, Ur: NEGATIVE ng/mL
Propoxyphene: NEGATIVE ng/mL
Tramadol: NEGATIVE ng/mL
pH, Urine: 5.4 (ref 4.5–8.9)

## 2024-05-30 DIAGNOSIS — K743 Primary biliary cirrhosis: Secondary | ICD-10-CM | POA: Diagnosis not present

## 2024-06-01 LAB — H. PYLORI ANTIGEN, STOOL: H pylori Ag, Stl: NEGATIVE

## 2024-06-01 NOTE — Progress Notes (Signed)
 Contacted via MyChart  H Pylori testing is negative:)

## 2024-06-05 ENCOUNTER — Inpatient Hospital Stay

## 2024-06-07 NOTE — Addendum Note (Signed)
 Addended by: Nazia Rhines T on: 06/07/2024 03:42 PM   Modules accepted: Orders

## 2024-06-07 NOTE — Addendum Note (Signed)
 Addended by: Dannell Raczkowski T on: 06/07/2024 02:57 PM   Modules accepted: Orders

## 2024-06-07 NOTE — Addendum Note (Signed)
 Addended by: Daveigh Batty T on: 06/07/2024 02:48 PM   Modules accepted: Orders

## 2024-06-13 ENCOUNTER — Ambulatory Visit: Admitting: Physical Therapy

## 2024-06-13 NOTE — Therapy (Incomplete)
 OUTPATIENT PHYSICAL THERAPY EVALUATION   Patient Name: [name] MRN: [id number] DOB:15-Jun-1956, 68 y.o., female Today's Date: 06/13/2024  END OF SESSION:   Past Medical History:  Diagnosis Date   Anxiety    Aortic atherosclerosis (HCC)    Arthritis    Atrial fibrillation with RVR (HCC)    a.) CHA2DS2VASc = 4 (age, sex, HTN, vascular disease history);  b.) rate/rhythm maintained on oral sotolol; chronically anticoagulated with apixaban    Atypical chest pain    Benign essential tremor    Chronic kidney disease, stage 3a (HCC)    COPD (chronic obstructive pulmonary disease) (HCC)    DDD (degenerative disc disease), cervical    a.) s/p ACDF C6-C7   DDD (degenerative disc disease), lumbar    Depression    Emphysema of lung (HCC) 2019   mild   Enlarged heart    Fatty liver    GERD (gastroesophageal reflux disease)    Hiatal hernia    Hyperlipidemia    Hypertension    Insomnia    a.) takes melatonin PRN   Long term current use of anticoagulant    a.) apixaban    Mass of right adrenal gland (HCC)    a.) CT AP 08/12/2022: measured 3.3 cm   Monocytosis    Morbid obesity (HCC)    Nicotine  dependence    OSA on CPAP    Osteoporosis    Pancreatitis    Peripheral vascular disease (HCC)    Psoriasis    SOB (shortness of breath)    Umbilical hernia    Vitamin D  deficiency    Past Surgical History:  Procedure Laterality Date   ABDOMINAL HYSTERECTOMY     ACHILLES TENDON REPAIR     Removed bone spur and repaired achilles tendon   ANTERIOR CERVICAL DECOMP/DISCECTOMY FUSION     APPENDECTOMY     COLONOSCOPY N/A 03/14/2024   Procedure: COLONOSCOPY;  Surgeon: Therisa Bi, MD;  Location: [facility] ENDOSCOPY;  Service: Gastroenterology;  Laterality: N/A;   COLONOSCOPY WITH PROPOFOL  N/A 01/17/2019   Procedure: COLONOSCOPY WITH PROPOFOL ;  Surgeon: Therisa Bi, MD;  Location: [facility] ENDOSCOPY;  Service: Gastroenterology;  Laterality: N/A;   ESOPHAGOGASTRODUODENOSCOPY N/A 03/14/2024    Procedure: EGD (ESOPHAGOGASTRODUODENOSCOPY);  Surgeon: Therisa Bi, MD;  Location: [facility] ENDOSCOPY;  Service: Gastroenterology;  Laterality: N/A;   FLEXIBLE BRONCHOSCOPY Bilateral 04/09/2023   Procedure: FLEXIBLE BRONCHOSCOPY;  Surgeon: Isadora Hose, MD;  Location: [facility] ORS;  Service: Pulmonary;  Laterality: Bilateral;   POLYPECTOMY  03/14/2024   Procedure: POLYPECTOMY, INTESTINE;  Surgeon: Therisa Bi, MD;  Location: [facility] ENDOSCOPY;  Service: Gastroenterology;;   TONSILLECTOMY     Patient Active Problem List   Diagnosis Date Noted   Long-term current use of benzodiazepine 05/22/2024   Primary biliary cholangitis (HCC) 05/22/2024   Adenomatous polyp of colon 03/14/2024   Iron deficiency anemia 01/04/2024   Anemia 12/27/2023   Chronic left shoulder pain 11/17/2023   History of colonic polyps 05/20/2023   Ventral hernia without obstruction or gangrene 05/17/2023   PVD (peripheral vascular disease) (HCC) 12/02/2022   Parenchymal liver disease 09/01/2022   Umbilical hernia without obstruction and without gangrene 08/14/2022   Atrial fibrillation with RVR (HCC) 04/14/2022   Osteopenia of neck of left femur 03/07/2022   Mass of right adrenal gland (HCC) 03/04/2021   Monocytosis 02/28/2021   Chronic kidney disease, stage 3a (HCC) 03/04/2020   Aortic atherosclerosis (HCC) 10/03/2019   Prediabetes 09/04/2019   Vitamin D  deficiency 12/29/2018   Osteoarthritis of right hip 12/27/2017  Advanced care planning/counseling discussion 06/07/2017   Benign hypertension 10/28/2015   Nicotine  dependence, cigarettes, uncomplicated 07/26/2015   Facet syndrome, lumbar 07/11/2015   Spinal stenosis, lumbar region, with neurogenic claudication 07/11/2015   Status post cervical spinal fusion 07/11/2015   Chronic pain 05/17/2015   Anxiety 05/14/2015   Depression, major, single episode, in partial remission (HCC) 05/14/2015   DDD (degenerative disc disease), lumbar 05/14/2015   Centrilobular emphysema (HCC)  05/14/2015   Hyperlipidemia 05/14/2015   Gastroesophageal reflux disease 05/14/2015   Radiculopathy, lumbar region 05/14/2015   Sleep apnea 05/14/2015   Morbid obesity (HCC) 05/14/2015   Benign essential tremor 05/14/2015    PCP: Kathleen Melanie DASEN, NP REFERRING PROVIDER: Valerio Melanie DASEN, NP REFERRING DIAG: 1. Chornic low back pain with leg weakness; 2. Chronic left shoulder pain with left digits 4 and 5 paresthesia  Rationale for Evaluation and Treatment: Rehabilitation  THERAPY DIAG:  No diagnosis found.  ONSET DATE: chronic   SUBJECTIVE:                                                                                                                                                                                          SUBJECTIVE STATEMENT: Pt reports long history of back and shoulder issues, recently discussed with PCP and asked about beginning PT and/or OT for this matter.   PERTINENT HISTORY:  [name] is a 68yoF referred to OPPT for chronic pain in low back and chronic left shoulder pain. Pt has BLE weakness associated with back issue and numbness in fingers 4 and 5 of left hand related to shoulder pain.   The following aggravate symptoms of the low back: The follow improve symptoms of the low back:   PAIN:  Are you having pain?  Low back:   Shoulder:   PRECAUTIONS: None  RED FLAGS:    WEIGHT BEARING RESTRICTIONS: No  FALLS:  Has patient fallen in last 6 months?   LIVING ENVIRONMENT: Lives with: {[facility] lives with:25569::lives with their family} Lives in: {Lives in:25570} Stairs: {opstairs:27293} Has following equipment at home: {Assistive devices:23999}  OCCUPATION: ***  PLOF: {PLOF:24004}  PATIENT GOALS: ***  OBJECTIVE:  DIAGNOSTIC FINDINGS:  ***  PATIENT SURVEYS:  {rehab surveys:24030}  COGNITION: Overall cognitive status: {cognition:24006}     SENSATION: {sensation:27233}  MUSCLE LENGTH: Hamstrings: Right *** deg; Left ***  deg Thomas test: Right *** deg; Left *** deg  POSTURE: {posture:25561}  PALPATION: ***  LUMBAR ROM:   AROM eval  Flexion   Extension   Right lateral flexion   Left lateral flexion   Right rotation   Left rotation    (Blank rows = not  tested)  LOWER EXTREMITY ROM:     {AROM/PROM:27142}  Right eval Left eval  Hip flexion    Hip extension    Hip abduction    Hip adduction    Hip internal rotation    Hip external rotation    Knee flexion    Knee extension    Ankle dorsiflexion    Ankle plantarflexion    Ankle inversion    Ankle eversion     (Blank rows = not tested)  LOWER EXTREMITY MMT:    MMT Right eval Left eval  Hip flexion    Hip extension    Hip abduction    Hip adduction    Hip internal rotation    Hip external rotation    Knee flexion    Knee extension    Ankle dorsiflexion    Ankle plantarflexion    Ankle inversion    Ankle eversion     (Blank rows = not tested)  LUMBAR SPECIAL TESTS:  {lumbar special test:25242}  FUNCTIONAL TESTS:  {Functional tests:24029}  GAIT: Distance walked: *** Assistive device utilized: {Assistive devices:23999} Level of assistance: {Levels of assistance:24026} Comments: ***  TREATMENT DATE: ***                                                                                                                                 PATIENT EDUCATION:  Education details: *** Person educated: {Person educated:25204} Education method: {Education Method:25205} Education comprehension: {Education Comprehension:25206}  HOME EXERCISE PROGRAM: ***  ASSESSMENT:  CLINICAL IMPRESSION: Patient is a *** y.o. *** who was seen today for physical therapy evaluation and treatment for ***.   OBJECTIVE IMPAIRMENTS: {opptimpairments:25111}.   ACTIVITY LIMITATIONS: {activitylimitations:27494}  PARTICIPATION LIMITATIONS: {participationrestrictions:25113}  PERSONAL FACTORS: {Personal factors:25162} are also affecting patient's  functional outcome.   REHAB POTENTIAL: {rehabpotential:25112}  CLINICAL DECISION MAKING: {clinical decision making:25114}  EVALUATION COMPLEXITY: {Evaluation complexity:25115}   GOALS: Goals reviewed with patient? yes  SHORT TERM GOALS: Target date: 07/14/24 Patient will report comprehension, confidence, and consistent compliance and of a simple home exercise program established to facilitate symptoms management and basic strengthening and/or segment mobility.  Baseline: Goal status: INITIAL  2.  Patient to demonstrate improved score on self-report measure by >9% to indicate reduced self-reported disability and/or pain.    Baseline:  Goal status: INITIAL  3.  Pt to report decrease in worst pain in most recent 5 days by >2 points NPRS. Baseline:  Goal status: INITIAL  LONG TERM GOALS: Target date: 09/04/2024  Pt to demonstrate ability to perform without back pain limitation, less than 2/10 increase in NPRS, and with >10% improvement in distance to improve ability to participate in IADL and social events.  Baseline:  Goal status: INITIAL  2.  Patient to improve score on self-report measure by MCID or greater to indicate reduced disability and improved quality of life.   Baseline:  Goal status: INITIAL  3.  Patient to demonstrate improved  performance on initial transfers assessment AEB improved reps per time or time per perform desired reps and/or reduced seat height and/or use of hands in order to improve safety, tolerance, and independence in ADL performance.   Baseline:  Goal status: INITIAL  4.  Patient to demonstrate improvement in strength testing by minimum of 1 grade to indicate improved motor performance.   Baseline:  Goal status: INITIAL  PLAN:  PT FREQUENCY: 1-2x/week PT DURATION: 12 weeks PLANNED INTERVENTIONS: 97750- Physical Performance Testing, 97110-Therapeutic exercises, 97530- Therapeutic activity, 97112- Neuromuscular re-education, 97535- Self Care,  97140- Manual therapy, [phone]- Gait training, [phone]- Electrical stimulation (unattended), [phone]- Electrical stimulation (manual), Patient/Family education, Balance training, Stair training, Joint mobilization, Joint manipulation, Spinal manipulation, Spinal mobilization, Cryotherapy, and Moist heat. PLAN FOR NEXT SESSION: HEP FU, complete T&M for low back, begin T&M for shoulder/hand.    Synethia Endicott C, PT 06/13/2024, 3:00 PM

## 2024-06-14 ENCOUNTER — Encounter: Payer: Self-pay | Admitting: Acute Care

## 2024-06-15 ENCOUNTER — Other Ambulatory Visit: Payer: Self-pay | Admitting: Acute Care

## 2024-06-15 ENCOUNTER — Encounter: Admitting: Physical Therapy

## 2024-06-15 ENCOUNTER — Ambulatory Visit: Admitting: Physical Therapy

## 2024-06-15 DIAGNOSIS — J439 Emphysema, unspecified: Secondary | ICD-10-CM | POA: Diagnosis not present

## 2024-06-15 DIAGNOSIS — F17210 Nicotine dependence, cigarettes, uncomplicated: Secondary | ICD-10-CM

## 2024-06-15 DIAGNOSIS — Z122 Encounter for screening for malignant neoplasm of respiratory organs: Secondary | ICD-10-CM

## 2024-06-15 DIAGNOSIS — Z87891 Personal history of nicotine dependence: Secondary | ICD-10-CM

## 2024-06-19 ENCOUNTER — Encounter: Admitting: Physical Therapy

## 2024-06-21 ENCOUNTER — Telehealth: Payer: Self-pay

## 2024-06-21 NOTE — Telephone Encounter (Signed)
 Returned call. LVM to call office and discuss upcoming CT scan on 7/28, per E2C2 pt needs to reschedule.

## 2024-06-22 ENCOUNTER — Ambulatory Visit: Admitting: Physical Therapy

## 2024-06-26 ENCOUNTER — Ambulatory Visit: Admission: RE | Admit: 2024-06-26 | Source: Ambulatory Visit

## 2024-06-27 ENCOUNTER — Encounter: Admitting: Physical Therapy

## 2024-06-27 ENCOUNTER — Ambulatory Visit
Admission: RE | Admit: 2024-06-27 | Discharge: 2024-06-27 | Disposition: A | Discharge status: Home / Self Care | Source: Ambulatory Visit | Attending: Acute Care | Admitting: Acute Care

## 2024-06-27 DIAGNOSIS — Z87891 Personal history of nicotine dependence: Secondary | ICD-10-CM | POA: Insufficient documentation

## 2024-06-27 DIAGNOSIS — F17210 Nicotine dependence, cigarettes, uncomplicated: Secondary | ICD-10-CM | POA: Insufficient documentation

## 2024-06-27 DIAGNOSIS — Z122 Encounter for screening for malignant neoplasm of respiratory organs: Secondary | ICD-10-CM | POA: Insufficient documentation

## 2024-06-29 ENCOUNTER — Encounter: Admitting: Physical Therapy

## 2024-07-02 ENCOUNTER — Other Ambulatory Visit: Payer: Self-pay | Admitting: Nurse Practitioner

## 2024-07-03 ENCOUNTER — Encounter: Admitting: Physical Therapy

## 2024-07-04 NOTE — Telephone Encounter (Signed)
 Requested by interface surescripts. Future visit 08/22/24 Requested Prescriptions  Pending Prescriptions Disp Refills   baclofen  (LIORESAL ) 10 MG tablet [Pharmacy Med Name: BACLOFEN  10 MG TABLET] 270 tablet 2    Sig: TAKE 1 TABLET BY MOUTH THREE TIMES A DAY     Analgesics:  Muscle Relaxants - baclofen  Passed - 07/04/2024  9:23 AM      Passed - Cr in normal range and within 180 days    Creatinine  Date Value Ref Range Status  05/17/2023 20.8 20.0 - 300.0 mg/dL Final  95/75/7984 9.00 0.60 - 1.30 mg/dL Final   Creatinine, Ser  Date Value Ref Range Status  02/07/2024 0.93 0.57 - 1.00 mg/dL Final         Passed - eGFR is 30 or above and within 180 days    EGFR (African American)  Date Value Ref Range Status  03/23/2014 >60  Final   GFR calc Af Amer  Date Value Ref Range Status  09/03/2020 63 >59 mL/min/1.73 Final    Comment:    **[facility] currently reports eGFR in compliance with the current**   recommendations of the [employer]. [facility] will   update reporting as new guidelines are published from the NKF-ASN   Task force.    EGFR (Non-African Amer.)  Date Value Ref Range Status  03/23/2014 >60  Final    Comment:    eGFR values <13mL/min/1.73 m2 may be an indication of chronic kidney disease (CKD). Calculated eGFR is useful in patients with stable renal function. The eGFR calculation will not be reliable in acutely ill patients when serum creatinine is changing rapidly. It is not useful in  patients on dialysis. The eGFR calculation may not be applicable to patients at the low and high extremes of body sizes, pregnant women, and vegetarians.    GFR, Estimated  Date Value Ref Range Status  03/31/2023 >60 >60 mL/min Final    Comment:    (NOTE) Calculated using the CKD-EPI Creatinine Equation (2021)    eGFR  Date Value Ref Range Status  02/07/2024 67 >59 mL/min/1.73 Final         Passed - Valid encounter within last 6 months    Recent Outpatient  Visits           1 month ago Depression, major, single episode, in partial remission (HCC)   [facility] [place], Galena T, NP   4 months ago Centrilobular emphysema (HCC)   [facility] [place], Melanie DASEN, NP

## 2024-07-05 ENCOUNTER — Encounter: Admitting: Physical Therapy

## 2024-07-07 ENCOUNTER — Telehealth: Payer: Self-pay | Admitting: Acute Care

## 2024-07-07 NOTE — Telephone Encounter (Signed)
 Received call report for LDCT:    IMPRESSION: 1. Lung-RADS 4B, suspicious. Additional imaging evaluation or consultation with Pulmonology or Thoracic Surgery recommended. New and progressive nodular opacities in the right lung. As noted on the previous lung cancer screening study, this patient has findings of presumptive atypical infection with a waxing and waning character. Findings are likely reflective of a atypical etiology but limit the candidacy of this patient for effective lung cancer screening given repeat false-positive exams. 2. Progressive consolidative nodular opacity in the paraspinal right lower lobe in an area of circumferential bronchial wall thickening and peripheral airway impaction. This may reflect infectious/inflammatory etiology or sequelae of aspiration. Close follow-up warranted to ensure resolution and exclude underlying neoplasm. This area is suspicious based on size criteria. There is a new 6.5 mm nodule in this region. 3. Focal consolidative nodular opacity in the lingula measures smaller and may reflect incomplete resolution of infectious/inflammatory etiology or evolving scar. 4. Enlargement of the pulmonary outflow tract/main pulmonary arteries suggests pulmonary arterial hypertension. 5. Stable 3 cm calcified right adrenal mass. 6.  Aortic Atherosclerosis (ICD10-I70.0).   These results will be called to the ordering clinician or representative by the Radiologist Assistant, and communication documented in the PACS or [employer].     Electronically Signed   By: Camellia Candle M.D.   On: 07/07/2024 07:30

## 2024-07-10 ENCOUNTER — Telehealth: Payer: Self-pay

## 2024-07-10 NOTE — Telephone Encounter (Signed)
 See telephone note 07/10/24.

## 2024-07-10 NOTE — Telephone Encounter (Addendum)
 Per Karna Curly, results have been reviewed by Ruthell, NP and request that patient follow up with Dr. Vester on 08/04/2024 as planned to review results. I have spoken with the patient and she verbalized understanding.

## 2024-07-10 NOTE — Telephone Encounter (Signed)
-----   Message from Hamilton Dgayli sent at 07/08/2024  1:39 PM EDT ----- Regarding: RE: 4b Thanks - agreed - I had bronched her and cultures grew H. Influenza and she improved with antibiotics. The repeat CT doesn't look much worse than prior. Will see her in clinic and discuss more.  Triage pool - could we please schedule her for follow up in a regular follow slot (or acute visit slot). A few weeks out is ok.  Thanks [employer] ----- Message ----- From: Tamea Dedra CROME, MD Sent: 07/07/2024   5:36 PM EDT To: Lauraine JULIANNA Lites, NP; Belva November, MD; Jean-P# Subject: RE: 39b                                         Sarah she actually follows with Dr. November last seen in March by him would consider follow-up with him to determine next steps though given the waxing and waning character of these findings 47-month follow-up may be reasonable.  Reestablishing with Dr. November is recommended.  LG ----- Message ----- From: Lites Lauraine JULIANNA, NP Sent: 07/07/2024   4:37 PM EDT To: Dedra CROME Tamea, MD; Belva November, MD; Je# Subject: 4b                                             Would one of you please look at this scan ( 06/27/2024), and the one done last year. It looks like radiology feel this is a waxing and waning infection. It has grown . Would you favor a 3 month follow up or have her seen in the [place] office to better evaluate. I tried calling her to see iof she has been sick, but there was no answer.  Thanks so much

## 2024-07-10 NOTE — Telephone Encounter (Signed)
 Appt has been scheduled for 9/5 at 10:30am.  Nothing further needed.

## 2024-07-11 ENCOUNTER — Encounter: Admitting: Physical Therapy

## 2024-07-16 DIAGNOSIS — J439 Emphysema, unspecified: Secondary | ICD-10-CM | POA: Diagnosis not present

## 2024-07-17 DIAGNOSIS — J449 Chronic obstructive pulmonary disease, unspecified: Secondary | ICD-10-CM | POA: Diagnosis not present

## 2024-07-17 DIAGNOSIS — I10 Essential (primary) hypertension: Secondary | ICD-10-CM | POA: Diagnosis not present

## 2024-07-17 DIAGNOSIS — E782 Mixed hyperlipidemia: Secondary | ICD-10-CM | POA: Diagnosis not present

## 2024-07-17 DIAGNOSIS — G4730 Sleep apnea, unspecified: Secondary | ICD-10-CM | POA: Diagnosis not present

## 2024-07-17 DIAGNOSIS — Z720 Tobacco use: Secondary | ICD-10-CM | POA: Diagnosis not present

## 2024-07-17 DIAGNOSIS — N1831 Chronic kidney disease, stage 3a: Secondary | ICD-10-CM | POA: Diagnosis not present

## 2024-07-17 DIAGNOSIS — I4891 Unspecified atrial fibrillation: Secondary | ICD-10-CM | POA: Diagnosis not present

## 2024-07-17 DIAGNOSIS — K219 Gastro-esophageal reflux disease without esophagitis: Secondary | ICD-10-CM | POA: Diagnosis not present

## 2024-07-17 DIAGNOSIS — J45909 Unspecified asthma, uncomplicated: Secondary | ICD-10-CM | POA: Diagnosis not present

## 2024-07-17 DIAGNOSIS — I739 Peripheral vascular disease, unspecified: Secondary | ICD-10-CM | POA: Diagnosis not present

## 2024-07-18 ENCOUNTER — Encounter: Admitting: Physical Therapy

## 2024-07-20 ENCOUNTER — Encounter: Admitting: Physical Therapy

## 2024-07-20 ENCOUNTER — Ambulatory Visit
Admission: RE | Admit: 2024-07-20 | Discharge: 2024-07-20 | Disposition: A | Discharge status: Home / Self Care | Source: Ambulatory Visit | Attending: Gastroenterology | Admitting: Gastroenterology

## 2024-07-20 DIAGNOSIS — K743 Primary biliary cirrhosis: Secondary | ICD-10-CM | POA: Diagnosis not present

## 2024-07-20 DIAGNOSIS — K7460 Unspecified cirrhosis of liver: Secondary | ICD-10-CM | POA: Diagnosis not present

## 2024-07-20 DIAGNOSIS — K8020 Calculus of gallbladder without cholecystitis without obstruction: Secondary | ICD-10-CM | POA: Diagnosis not present

## 2024-07-20 DIAGNOSIS — D509 Iron deficiency anemia, unspecified: Secondary | ICD-10-CM | POA: Diagnosis not present

## 2024-07-24 DIAGNOSIS — R7989 Other specified abnormal findings of blood chemistry: Secondary | ICD-10-CM | POA: Diagnosis not present

## 2024-07-24 DIAGNOSIS — K743 Primary biliary cirrhosis: Secondary | ICD-10-CM | POA: Diagnosis not present

## 2024-07-25 ENCOUNTER — Encounter: Admitting: Physical Therapy

## 2024-07-27 ENCOUNTER — Encounter: Admitting: Physical Therapy

## 2024-08-04 ENCOUNTER — Encounter: Payer: Self-pay | Admitting: Student in an Organized Health Care Education/Training Program

## 2024-08-04 ENCOUNTER — Ambulatory Visit: Admitting: Student in an Organized Health Care Education/Training Program

## 2024-08-04 VITALS — BP 110/70 | HR 75 | Temp 97.1°F | Ht 63.0 in | Wt 257.0 lb

## 2024-08-04 DIAGNOSIS — R0602 Shortness of breath: Secondary | ICD-10-CM

## 2024-08-04 DIAGNOSIS — R911 Solitary pulmonary nodule: Secondary | ICD-10-CM

## 2024-08-04 DIAGNOSIS — F17210 Nicotine dependence, cigarettes, uncomplicated: Secondary | ICD-10-CM

## 2024-08-04 DIAGNOSIS — J439 Emphysema, unspecified: Secondary | ICD-10-CM | POA: Diagnosis not present

## 2024-08-04 DIAGNOSIS — F17200 Nicotine dependence, unspecified, uncomplicated: Secondary | ICD-10-CM

## 2024-08-04 LAB — NITRIC OXIDE: Nitric Oxide: 10

## 2024-08-04 MED ORDER — NICOTINE 7 MG/24HR TD PT24: 7.0000 mg | MEDICATED_PATCH | TRANSDERMAL | 0 refills | Status: AC

## 2024-08-04 MED ORDER — NICOTINE POLACRILEX 2 MG MT LOZG: 2.0000 mg | LOZENGE | OROMUCOSAL | 3 refills | Status: AC | PRN

## 2024-08-04 MED ORDER — CEFPODOXIME PROXETIL 200 MG PO TABS: 200.0000 mg | ORAL_TABLET | Freq: Two times a day (BID) | ORAL | 0 refills | Status: AC

## 2024-08-04 MED ORDER — NICOTINE 21 MG/24HR TD PT24: 21.0000 mg | MEDICATED_PATCH | TRANSDERMAL | 0 refills | Status: AC

## 2024-08-04 MED ORDER — NICOTINE 14 MG/24HR TD PT24: 14.0000 mg | MEDICATED_PATCH | TRANSDERMAL | 0 refills | Status: AC

## 2024-08-04 MED ORDER — ARNUITY ELLIPTA 50 MCG/ACT IN AEPB: 1.0000 | INHALATION_SPRAY | Freq: Every day | RESPIRATORY_TRACT | 11 refills | Status: AC

## 2024-08-04 NOTE — Patient Instructions (Signed)
 The [place]  Quitline: Call 1-800-QUIT-NOW (682 840 6469). The [place] Quitline is a free service for [place]  residents. Trained counselors are available from 8 am until 3 am, 365 days per year. Services are available in both [place] and [place].   Web Resources Free online support programs can help you track your progress and share experiences with others who are quitting. These are examples: www.becomeanex.org www.trytostop.org  www.smokefree.gov  www.https://www.vargas.com/.aspx  [facility]: offers comprehensive in-person tobacco treatment counseling at [facility] building ([address], [place] [address]).  Open to everyone. Virtual appointments available. Free parking. Call [phone] to schedule an appointment or [phone] for general information.    Tobacco Cessation Medications  Nicotine  Replacement Therapy (NRT)  Nicotine  is the addictive part of tobacco smoke, but not the most dangerous part. There are 7000 other toxins in cigarettes, including carbon monoxide, that cause disease. People do not generally become addicted to medication. Common problems: People don't use enough medication or stop too early. Medications are safe and effective. Overdose is very uncommon. Use medications as long as needed (3 months minimum). Some combinations work better than single medications. Long acting medications like the NRT patch and bupropion provide continuous treatment for withdrawal symptoms.  PLUS  Short acting medications like the NRT gum, lozenge, inhaler, and nasal spray help people to cope with breakthrough cravings.  ? Nicotine  Patch  Place patch on hairless skin on upper body, including arms and back. Each day: discard old patch, shower, apply new patch to a different site. Apply hydrocortisone cream to mildly red/irritated areas. Call provider if rash develops. If patch causes sleep disturbance, remove patch  at bedtime and replace each morning after shower. Side effects may include: skin irritation, headache, insomnia, abnormal/vivid dreams.  ? Nicotine  Gum  Chew gum slowly, park in cheek when peppery taste or tingling sensation begins (about 15-30 chews). When taste or tingling goes away, begin chewing again. Use until nicotine  is gone (taste or tingle does not return, usually 30 minutes). Park in different areas of mouth. Nicotine  is absorbed through the lining of the mouth. Use enough to control cravings, up to 24 pieces per day (if used alone). Avoid eating or drinking for 15 minutes before using and during use. Side effects may include: mouth/jaw soreness, hiccups, indigestion, hypersalivation.  If gum is not chewed correctly, additional side effects may include lightheadedness, nausea/vomiting, throat and mouth irritation.  ? Nicotine  Lozenge  Allow to dissolve slowly in mouth (20-30 minutes). Do not chew or swallow. Nicotine  release may cause a warm tingling sensation. Occasionally rotate to different areas of the mouth. Use enough to control cravings, up to 20 lozenges per day (if used alone). Avoid eating or drinking for 15 minutes before using and during use. Side effects may include: nausea, hiccups, cough, heartburn, headache, gas, insomnia.  ? Nicotine  Nasal Spray Use 1 spray in each nostril (1 dose) and tilt head back for 1 minute. Do not sniff, swallow, or inhale through nose.  Use at least 8 doses (1 spray in each nostril) , up to 40 doses per day (if used alone). To reduce nasal irritation, spray on cotton swab and insert into nose. Side effects may include: nasal and/or throat irritation (hot, peppery, or burning sensation), nasal irritation, tearing, sneezing, cough, headache.  ? Nicotine  Oral Inhaler (puffer) Inhale into the back of the throat or puff in short breaths. Do not inhale into the lungs.  Puff continuously for 20 minutes (about 80 puffs) until cartridge  is  empty. Change cartridge when it loses the "burning in throat" sensation (feels like air only). Open cartridges can be saved and used again within 24 hours. Use at least 6 and up to 16 cartridges per day (if used alone).  Avoid eating or drinking for 15 minutes before using and during use. Side effects may include: mouth and/or throat irritation, unpleasant taste, cough, nasal irritation, indigestion, hiccups, headache.  ? Chantix  (varenicline ) Days 1-3: Take one 0.5 mg white pill each morning for 3 days, one week before quit date. Days 4-7: Increase to one 0.5 mg white pill twice a day in morning and evening for 4 days.  On Day 8 (target quit date), increase to one 1 mg blue pill twice a day. Maintain this dose for a minimum of 3 months. Take with food and a full glass of water to reduce nausea. Be sure that the two doses are at least 8 hours apart, but try to take second dose early in the evening (i.e. 6 pm) to avoid sleep problems. Common side effects include: nausea, insomnia, headache, abnormal/vivid dreams. Tell your doctor if you have any history of psychiatric illness prior to starting Chantix .  STOP taking CHANTIX  and contact a healthcare provider immediately if you experience agitation, hostility, depressed mood, changes in thoughts or behavior that are not typical for you, thinking about or attempting suicide, allergic or skin reactions including swelling, rash, redness, or peeling of the skin.  For patients who have heart disease: Smoking is a major risk factor for cardiovascular disease, and Chantix  can help you quit smoking. Chantix  may be associated with a small, increased risk of certain heart events in patients who have heart disease. If you have any new or worsening symptoms of heart disease while taking Chantix , such as shortness of breath or trouble breathing, new or worsening chest pain, or new or worsening pain in your legs when walking, call your doctor or get emergency medical  help immediately.  ? Wellbutrin / Zyban (bupropion) Take one 150 mg pill each morning for 3 days, one week before target quit date. On Day 4, increase to one 150 mg pill twice a day, morning and evening.  Maintain this dose for a minimum of 3 months. Be sure that the two doses are at least 8 hours apart, but try to take second dose early in the evening (i.e. 6 pm) to avoid sleep problems. Avoid or minimize use of alcohol when taking this medication. Common side effects include: dry mouth, headache, insomnia, nausea, weight loss.  Risk of seizure is 11/998. STOP taking BUPROPION and contact a healthcare provider immediately if you experience agitation, hostility, depressed mood, changes in thoughts or behavior that are not typical for you, thinking about or attempting suicide, allergic or skin reactions including swelling, rash, redness, or peeling of the skin.

## 2024-08-04 NOTE — Progress Notes (Signed)
 Assessment & Plan:   #COPD Gold 2B - Emphysema #Bronchiectasis   Presents for follow-up today with history of COPD and emphysema.  Her CT scan also shows some bronchiectatic airways especially in the lingula.  PFTs had shown preserved ratio with FEV1/FVC at 0.74.  FEV1 was at 74% predicted.  Flow-volume loops did suggest obstruction with scooping especially in light of her history of smoking and emphysema on CT.  We initiated her on LABA/LAMA with Stiolto Respimat  which she has tolerated well.  Review of historic CBC data shows max eosinophil count of 1100 suggesting an inflammatory component for an asthma/COPD overlap.  FeNO in clinic today was normal at 10 ppb.  Given all this, I will add ICS to her regimen with Arnuity Ellipta .  Review of her CT scan shows signs of bronchiectasis especially in the lingula.  This area appears to have been consolidated previously and this is likely a sequelae of her recent infection (H.influenza isolated on BAL).  I had prescribed the patient a pulmonary clearance regimen (nebulized saline, nebulized albuterol , flutter device, incentive spirometry) that she is not using as prescribed.  She reports that this causes her to cough vigorously resulting in back pain.  I encouraged her to use it as much as possible.  - Continue Stiolto 2 puffs once daily  - Start Fluticasone  Furoate (ARNUITY ELLIPTA ) 50 MCG/ACT AEPB; Inhale 1 puff into the lungs daily.  Dispense: 60 each; Refill: 11 - Encouraged to be compliant with pulmonary clearance regimen - Recommend tobacco cessation  #Pulmonary Infiltrate   Initially presented for the evaluation of a lingular infiltrate with mass like consolidation, tree-in-bud nodularity, as well as bronchial wall thickening. Bronchoscopy with BAL grew H. Influenza, and the patient received a course of Cefpodoxime  for treatment, with improvement. This was resolved on CT from July of 2024.  She has resumed her yearly low-dose CT for lung cancer  screening which is showing right lower lobe patchy consolidative nodularity again suggestive of infectious process.  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  This process is sometimes associated with interstitial lung disease as well as with granulomatous inflammation though her presentation is not consistent with that.  Her findings are more in line with recurrent infectious process for which I will prescribe her another course of antibiotics with cefpodoxime .  She should be okay to continue with lung cancer screening.  - cefpodoxime  (VANTIN ) 200 MG tablet; Take 1 tablet (200 mg total) by mouth 2 (two) times daily for 7 days.  Dispense: 14 tablet; Refill: 0  #Tobacco use disorder  Discussed her cigarette smoking today and counseled her to quit.  Patient is willing to try to cut down and quit smoking with the help of nicotine  replacement therapy.  - nicotine  (NICODERM CQ  - DOSED IN MG/24 HOURS) 14 mg/24hr patch; Place 1 patch (14 mg total) onto the skin daily for 14 days.  Dispense: 14 patch; Refill: 0 - nicotine  (NICODERM CQ  - DOSED IN MG/24 HOURS) 21 mg/24hr patch; Place 1 patch (21 mg total) onto the skin daily.  Dispense: 42 patch; Refill: 0 - nicotine  polacrilex (NICOTINE  MINI) 2 MG lozenge; Take 1 lozenge (2 mg total) by mouth every 2 (two) hours as needed for smoking cessation.  Dispense: 72 lozenge; Refill: 3 - nicotine  (NICODERM CQ  - DOSED IN MG/24 HR) 7 mg/24hr patch; Place 1 patch (7 mg total) onto the skin daily for 14 days.  Dispense: 14 patch;  Refill: 0   Return in about 3 months (around 11/03/2024).  I spent 33 minutes caring for this patient today, including preparing to see the patient, obtaining a medical history , reviewing a separately obtained history, performing a medically appropriate examination and/or evaluation, counseling and educating the patient/family/caregiver, ordering medications, tests, or procedures,  documenting clinical information in the electronic health record, and independently interpreting results (not separately reported/billed) and communicating results to the patient/family/caregiver. 3 minutes utilized during today's visit to counsel the patient regarding the importance and strategies of smoking cessation.  Belva November, MD [facility]  End of visit medications:  Meds ordered this encounter  Medications   Fluticasone  Furoate (ARNUITY ELLIPTA ) 50 MCG/ACT AEPB    Sig: Inhale 1 puff into the lungs daily.    Dispense:  60 each    Refill:  11   cefpodoxime  (VANTIN ) 200 MG tablet    Sig: Take 1 tablet (200 mg total) by mouth 2 (two) times daily for 7 days.    Dispense:  14 tablet    Refill:  0   nicotine  (NICODERM CQ  - DOSED IN MG/24 HOURS) 14 mg/24hr patch    Sig: Place 1 patch (14 mg total) onto the skin daily for 14 days.    Dispense:  14 patch    Refill:  0   nicotine  (NICODERM CQ  - DOSED IN MG/24 HOURS) 21 mg/24hr patch    Sig: Place 1 patch (21 mg total) onto the skin daily.    Dispense:  42 patch    Refill:  0   nicotine  polacrilex (NICOTINE  MINI) 2 MG lozenge    Sig: Take 1 lozenge (2 mg total) by mouth every 2 (two) hours as needed for smoking cessation.    Dispense:  72 lozenge    Refill:  3   nicotine  (NICODERM CQ  - DOSED IN MG/24 HR) 7 mg/24hr patch    Sig: Place 1 patch (7 mg total) onto the skin daily for 14 days.    Dispense:  14 patch    Refill:  0     Current Outpatient Medications:    acetaminophen  (TYLENOL ) 500 MG tablet, Take 1,000 mg by mouth in the morning and at bedtime., Disp: , Rfl:    albuterol  (PROAIR  HFA) 108 (90 Base) MCG/ACT inhaler, Inhale 2 puffs into the lungs every 6 (six) hours as needed for wheezing or shortness of breath., Disp: 18 g, Rfl: 4   albuterol  (PROVENTIL ) (2.5 MG/3ML) 0.083% nebulizer solution, Take 3 mLs (2.5 mg total) by nebulization in the morning and at bedtime., Disp: 75 mL, Rfl: 12   apixaban   (ELIQUIS ) 5 MG TABS tablet, Take 1 tablet (5 mg total) by mouth 2 (two) times daily., Disp: 60 tablet, Rfl: 1   ascorbic acid (VITAMIN C) 500 MG tablet, Take 500 mg by mouth daily., Disp: , Rfl:    atorvastatin  (LIPITOR ) 80 MG tablet, Take 1 tablet (80 mg total) by mouth daily., Disp: 90 tablet, Rfl: 4   baclofen  (LIORESAL ) 10 MG tablet, TAKE 1 TABLET BY MOUTH THREE TIMES A DAY, Disp: 270 tablet, Rfl: 2   busPIRone  (BUSPAR ) 10 MG tablet, Take 2 tablets (20 mg total) by mouth 3 (three) times daily., Disp: 540 tablet, Rfl: 2   Calcium  Citrate-Vitamin D  (CALCIUM  + D PO), Take 1,000 mg by mouth daily., Disp: , Rfl:    cefpodoxime  (VANTIN ) 200 MG tablet, Take 1 tablet (200 mg total) by mouth 2 (two) times daily for 7 days., Disp: 14  tablet, Rfl: 0   Cholecalciferol  (VITAMIN D ) 50 MCG (2000 UT) CAPS, Take by mouth., Disp: , Rfl:    clonazePAM  (KLONOPIN ) 0.5 MG tablet, TAKE 1 TABLET BY MOUTH EVERY DAY AS NEEDED FOR ANXIETY, Disp: 30 tablet, Rfl: 2   Ferrous Sulfate Dried (SM SLOW RELEASE IRON) 143 (45 Fe) MG TBCR, Take 1 tablet by mouth daily., Disp: , Rfl:    fluticasone  (FLONASE ) 50 MCG/ACT nasal spray, SPRAY 2 SPRAYS INTO EACH NOSTRIL EVERY DAY, Disp: 48 mL, Rfl: 1   Fluticasone  Furoate (ARNUITY ELLIPTA ) 50 MCG/ACT AEPB, Inhale 1 puff into the lungs daily., Disp: 60 each, Rfl: 11   gabapentin  (NEURONTIN ) 800 MG tablet, Take 1 tablet (800 mg total) by mouth in the morning and at bedtime., Disp: 180 tablet, Rfl: 4   hydrOXYzine  (ATARAX ) 10 MG tablet, Take 1 tablet (10 mg total) by mouth 3 (three) times daily as needed., Disp: 270 tablet, Rfl: 4   losartan (COZAAR) 50 MG tablet, Take 50 mg by mouth daily., Disp: , Rfl:    Melatonin 10 MG TABS, Take 1 tablet by mouth at bedtime., Disp: , Rfl:    Multiple Vitamins-Minerals (MULTIVITAMIN WITH MINERALS) tablet, Take 1 tablet by mouth daily., Disp: , Rfl:    nicotine  (NICODERM CQ  - DOSED IN MG/24 HOURS) 14 mg/24hr patch, Place 1 patch (14 mg total) onto the  skin daily for 14 days., Disp: 14 patch, Rfl: 0   nicotine  (NICODERM CQ  - DOSED IN MG/24 HOURS) 21 mg/24hr patch, Place 1 patch (21 mg total) onto the skin daily., Disp: 42 patch, Rfl: 0   nicotine  (NICODERM CQ  - DOSED IN MG/24 HR) 7 mg/24hr patch, Place 1 patch (7 mg total) onto the skin daily for 14 days., Disp: 14 patch, Rfl: 0   nicotine  polacrilex (NICOTINE  MINI) 2 MG lozenge, Take 1 lozenge (2 mg total) by mouth every 2 (two) hours as needed for smoking cessation., Disp: 72 lozenge, Rfl: 3   omeprazole  (PRILOSEC) 40 MG capsule, Take 1 capsule (40 mg total) by mouth daily., Disp: 90 capsule, Rfl: 4   potassium chloride  SA (KLOR-CON  M20) 20 MEQ tablet, Take 1 tablet (20 mEq total) by mouth daily., Disp: 90 tablet, Rfl: 4   sotalol (BETAPACE) 80 MG tablet, Take 80 mg by mouth 2 (two) times daily., Disp: , Rfl:    Tiotropium Bromide -Olodaterol (STIOLTO RESPIMAT ) 2.5-2.5 MCG/ACT AERS, Inhale 2 puffs into the lungs daily., Disp: 120 each, Rfl: 12   torsemide (DEMADEX) 20 MG tablet, Take 20 mg by mouth.  Take 1 tablet (20 mg total) by mouth 2 (two) times daily for 3 days, THEN 1 tablet (20 mg total) once daily for 90 days., Disp: , Rfl:    ursodiol  (URSO  FORTE) 500 MG tablet, Take 1 tablet (500 mg total) by mouth 3 (three) times daily., Disp: 270 tablet, Rfl: 2   venlafaxine  XR (EFFEXOR -XR) 150 MG 24 hr capsule, Take 1 capsule (150 mg total) by mouth daily with breakfast., Disp: 90 capsule, Rfl: 4   vitamin B-12 (CYANOCOBALAMIN ) 1000 MCG tablet, Take 1,000 mcg by mouth daily., Disp: , Rfl:    Subjective:   PATIENT ID: [name] GENDER: female DOB: Apr 26, 1956, MRN: [id number]  Chief Complaint  Patient presents with   Medical Management of Chronic Issues    DOE. No wheezing. Occasional cough.     HPI  68 year old female with a history of A-fib (on Eliquis ) and COPD presenting today for follow-up.   I first met Ms. Garner in  May of 2024 for the evaluation of findings noted on LDCT for  lung cancer screening. She underwent bronchoscopy on 04/09/2023 with BAL which grew H. Influenza (treated with antibiotics) and improvement in symptoms.   Patient was enrolled in lung cancer screening and was note to have a left lingular infiltrate with tree in bud nodularity that have increased over the past few scans. The two CT's from November of 2023 and February of 2024 showed a nodular and mass-like areas of consolidation in the lingula. On my review, there were tree-in-bud opacities in that area as well. She underwent flexible bronchoscopy on 04/09/2023 with BAL growing H. Influenza. I prescribed her a course of Cefpodoxime  which she tolerated well. Repeat CT in July of 2024 showed resolution of the pulmonary infiltrates.  Return visit 02/09/2024   Symptoms are unchanged from prior. She is tolerating Stiolto well and felt improvement with it. She continues to be in her usual state of health and has an occasional cough but no wheeze or increased shortness of breath. She does report exertional dyspnea. She is limited with activity secondary to arthritis. She is requesting pre-operative evaluation and risk assessment.  Return visit 08/04/2024  She presents today for follow-up after recent low-dose CT for lung cancer screening showed infiltrates.  CT report notes waxing and waning pulmonary nodules that is resulting in repeat false positive exams.  Her symptoms are at baseline.  She continues to have cough productive of some phlegm.  She is short of breath with exertion but denies having a wheeze.  She is unable to do the full pulmonary clearance regimen that I had prescribed her.  She feels that the nebulized hypertonic saline results in significant coughing fits that lead to muscle spasm and severe back pain.  She is compliant with her inhalers.  She unfortunately continues to smoke.  While she had managed to lose a lot of weight while on Wegovy , this is no longer covered by her insurance and she has  gained quite a bit of weight back.   Patient is a current smoker and has smoked for around 50 years, with at least 50 pack years of smoking history. She used to work in a [employer]. She lives at home with her son, and has lived in her house for over 20 years. She has a crawl space, no basement, no mold, no hot tub, and no Jacuzzi. Water source is municipal. No report of previous infections.  Ancillary information including prior medications, full medical/surgical/family/social histories, and PFTs (when available) are listed below and have been reviewed.    Review of Systems  Constitutional:  Negative for chills, fever and weight loss.     Objective:   Vitals:   08/04/24 1023  BP: 110/70  Pulse: 75  Temp: (!) 97.1 F (36.2 C)  SpO2: 95%  Weight: 257 lb (116.6 kg)  Height: 5' 3 (1.6 m)   95% on RA  BMI Readings from Last 3 Encounters:  08/04/24 45.53 kg/m  05/22/24 45.45 kg/m  03/27/24 43.19 kg/m   Wt Readings from Last 3 Encounters:  08/04/24 257 lb (116.6 kg)  05/22/24 256 lb 9.6 oz (116.4 kg)  03/27/24 243 lb 12.8 oz (110.6 kg)    Physical Exam    Ancillary Information    Past Medical History:  Diagnosis Date   Anxiety    Aortic atherosclerosis (HCC)    Arthritis    Atrial fibrillation with RVR (HCC)    a.) CHA2DS2VASc = 4 (age, sex, HTN,  vascular disease history);  b.) rate/rhythm maintained on oral sotolol; chronically anticoagulated with apixaban    Atypical chest pain    Benign essential tremor    Chronic kidney disease, stage 3a (HCC)    COPD (chronic obstructive pulmonary disease) (HCC)    DDD (degenerative disc disease), cervical    a.) s/p ACDF C6-C7   DDD (degenerative disc disease), lumbar    Depression    Emphysema of lung (HCC) 2019   mild   Enlarged heart    Fatty liver    GERD (gastroesophageal reflux disease)    Hiatal hernia    Hyperlipidemia    Hypertension    Insomnia    a.) takes melatonin PRN   Long term current use of  anticoagulant    a.) apixaban    Mass of right adrenal gland (HCC)    a.) CT AP 08/12/2022: measured 3.3 cm   Monocytosis    Morbid obesity (HCC)    Nicotine  dependence    OSA on CPAP    Osteoporosis    Pancreatitis    Peripheral vascular disease (HCC)    Psoriasis    SOB (shortness of breath)    Umbilical hernia    Vitamin D  deficiency      Family History  Problem Relation Age of Onset   COPD Father    Heart disease Father    Hyperlipidemia Father    Hypertension Father    Dementia Father    Alcohol abuse Father    Cancer Sister    Hyperlipidemia Sister    Hypertension Sister    Lymphoma Sister    Migraines Son    Stroke Maternal Grandfather    Heart attack Brother    Cancer Brother        colorectal and liver   Obesity Mother    Breast cancer Paternal Aunt    Cancer Paternal Aunt    Breast cancer Paternal Aunt    Diabetes Brother      Past Surgical History:  Procedure Laterality Date   ABDOMINAL HYSTERECTOMY     ACHILLES TENDON REPAIR     Removed bone spur and repaired achilles tendon   ANTERIOR CERVICAL DECOMP/DISCECTOMY FUSION     APPENDECTOMY     COLONOSCOPY N/A 03/14/2024   Procedure: COLONOSCOPY;  Surgeon: Therisa Bi, MD;  Location: [facility] ENDOSCOPY;  Service: Gastroenterology;  Laterality: N/A;   COLONOSCOPY WITH PROPOFOL  N/A 01/17/2019   Procedure: COLONOSCOPY WITH PROPOFOL ;  Surgeon: Therisa Bi, MD;  Location: [facility] ENDOSCOPY;  Service: Gastroenterology;  Laterality: N/A;   ESOPHAGOGASTRODUODENOSCOPY N/A 03/14/2024   Procedure: EGD (ESOPHAGOGASTRODUODENOSCOPY);  Surgeon: Therisa Bi, MD;  Location: [facility] ENDOSCOPY;  Service: Gastroenterology;  Laterality: N/A;   FLEXIBLE BRONCHOSCOPY Bilateral 04/09/2023   Procedure: FLEXIBLE BRONCHOSCOPY;  Surgeon: Isadora Hose, MD;  Location: [facility] ORS;  Service: Pulmonary;  Laterality: Bilateral;   POLYPECTOMY  03/14/2024   Procedure: POLYPECTOMY, INTESTINE;  Surgeon: Therisa Bi, MD;  Location: [facility] ENDOSCOPY;   Service: Gastroenterology;;   TONSILLECTOMY      Social History   Socioeconomic History   Marital status: Widowed    Spouse name: Not on file   Number of children: 1   Years of education: Not on file   Highest education level: GED or equivalent  Occupational History   Occupation: disabled  Tobacco Use   Smoking status: Every Day    Current packs/day: 1.00    Average packs/day: 1 pack/day for 53.7 years (53.7 ttl pk-yrs)    Types: Cigarettes    Start  date: 36    Passive exposure: Past   Smokeless tobacco: Never   Tobacco comments:    Smoking 0.5-1 ppd.  She has used patches/lozenges and they did not help.  08/04/2024 khj  Vaping Use   Vaping status: Never Used  Substance and Sexual Activity   Alcohol use: No    Alcohol/week: 0.0 standard drinks of alcohol   Drug use: No   Sexual activity: Never    Partners: Male  Other Topics Concern   Not on file  Social History Narrative   Not on file   Social Drivers of Health   Financial Resource Strain: Low Risk  (07/24/2024)   Received from [facility]   Overall Financial Resource Strain (CARDIA)    Difficulty of Paying Living Expenses: Not hard at all  Food Insecurity: No Food Insecurity (07/24/2024)   Received from [facility]   Hunger Vital Sign    Within the past 12 months, you worried that your food would run out before you got the money to buy more.: Never true    Within the past 12 months, the food you bought just didn't last and you didn't have money to get more.: Never true  Transportation Needs: No Transportation Needs (07/24/2024)   Received from [facility] - Transportation    In the past 12 months, has lack of transportation kept you from medical appointments or from getting medications?: No    Lack of Transportation (Non-Medical): No  Physical Activity: Inactive (01/18/2024)   Exercise Vital Sign    Days of Exercise per Week: 0 days    Minutes of  Exercise per Session: 0 min  Stress: Stress Concern Present (01/18/2024)   [employer] of Occupational Health - Occupational Stress Questionnaire    Feeling of Stress : To some extent  Social Connections: Socially Isolated (01/18/2024)   Social Connection and Isolation Panel    Frequency of Communication with Friends and Family: Once a week    Frequency of Social Gatherings with Friends and Family: More than three times a week    Attends Religious Services: Never    Database administrator or Organizations: No    Attends Banker Meetings: Never    Marital Status: Widowed  Intimate Partner Violence: Not At Risk (01/18/2024)   Humiliation, Afraid, Rape, and Kick questionnaire    Fear of Current or Ex-Partner: No    Emotionally Abused: No    Physically Abused: No    Sexually Abused: No     Allergies  Allergen Reactions   Iodinated Contrast Media Rash    Had rash present to neck with PET scan contrast     CBC    Component Value Date/Time   WBC 10.7 (H) 03/03/2024 1334   RBC 4.19 03/03/2024 1335   RBC 4.21 03/03/2024 1334   HGB 11.7 (L) 03/03/2024 1334   HGB 12.0 02/07/2024 1530   HCT 38.3 03/03/2024 1334   HCT 38.9 02/07/2024 1530   PLT 288 03/03/2024 1334   PLT 285 02/07/2024 1530   MCV 91.0 03/03/2024 1334   MCV 87 02/07/2024 1530   MCV 93 03/23/2014 1851   MCH 27.8 03/03/2024 1334   MCHC 30.5 03/03/2024 1334   RDW 20.1 (H) 03/03/2024 1334   RDW 19.3 (H) 02/07/2024 1530   RDW 14.7 (H) 03/23/2014 1851   LYMPHSABS 3.7 (H) 02/07/2024 1530   MONOABS 1.0 03/31/2023 0321   EOSABS 0.2 02/07/2024 1530  BASOSABS 0.0 02/07/2024 1530    Pulmonary Functions Testing Results:    Latest Ref Rng & Units 08/24/2023    3:27 PM  PFT Results  FVC-Pre L 2.37   FVC-Predicted Pre % 79   FVC-Post L 2.31   FVC-Predicted Post % 77   Pre FEV1/FVC % % 72   Post FEV1/FCV % % 74   FEV1-Pre L 1.70   FEV1-Predicted Pre % 74   FEV1-Post L 1.70   DLCO uncorrected  ml/min/mmHg 11.07   DLCO UNC% % 58   DLVA Predicted % 63   TLC L 7.90   TLC % Predicted % 161   RV % Predicted % 266     Outpatient Medications Prior to Visit  Medication Sig Dispense Refill   acetaminophen  (TYLENOL ) 500 MG tablet Take 1,000 mg by mouth in the morning and at bedtime.     albuterol  (PROAIR  HFA) 108 (90 Base) MCG/ACT inhaler Inhale 2 puffs into the lungs every 6 (six) hours as needed for wheezing or shortness of breath. 18 g 4   albuterol  (PROVENTIL ) (2.5 MG/3ML) 0.083% nebulizer solution Take 3 mLs (2.5 mg total) by nebulization in the morning and at bedtime. 75 mL 12   apixaban  (ELIQUIS ) 5 MG TABS tablet Take 1 tablet (5 mg total) by mouth 2 (two) times daily. 60 tablet 1   ascorbic acid (VITAMIN C) 500 MG tablet Take 500 mg by mouth daily.     atorvastatin  (LIPITOR ) 80 MG tablet Take 1 tablet (80 mg total) by mouth daily. 90 tablet 4   baclofen  (LIORESAL ) 10 MG tablet TAKE 1 TABLET BY MOUTH THREE TIMES A DAY 270 tablet 2   busPIRone  (BUSPAR ) 10 MG tablet Take 2 tablets (20 mg total) by mouth 3 (three) times daily. 540 tablet 2   Calcium  Citrate-Vitamin D  (CALCIUM  + D PO) Take 1,000 mg by mouth daily.     Cholecalciferol  (VITAMIN D ) 50 MCG (2000 UT) CAPS Take by mouth.     clonazePAM  (KLONOPIN ) 0.5 MG tablet TAKE 1 TABLET BY MOUTH EVERY DAY AS NEEDED FOR ANXIETY 30 tablet 2   Ferrous Sulfate Dried (SM SLOW RELEASE IRON) 143 (45 Fe) MG TBCR Take 1 tablet by mouth daily.     fluticasone  (FLONASE ) 50 MCG/ACT nasal spray SPRAY 2 SPRAYS INTO EACH NOSTRIL EVERY DAY 48 mL 1   gabapentin  (NEURONTIN ) 800 MG tablet Take 1 tablet (800 mg total) by mouth in the morning and at bedtime. 180 tablet 4   hydrOXYzine  (ATARAX ) 10 MG tablet Take 1 tablet (10 mg total) by mouth 3 (three) times daily as needed. 270 tablet 4   losartan (COZAAR) 50 MG tablet Take 50 mg by mouth daily.     Melatonin 10 MG TABS Take 1 tablet by mouth at bedtime.     Multiple Vitamins-Minerals (MULTIVITAMIN WITH  MINERALS) tablet Take 1 tablet by mouth daily.     omeprazole  (PRILOSEC) 40 MG capsule Take 1 capsule (40 mg total) by mouth daily. 90 capsule 4   potassium chloride  SA (KLOR-CON  M20) 20 MEQ tablet Take 1 tablet (20 mEq total) by mouth daily. 90 tablet 4   sotalol (BETAPACE) 80 MG tablet Take 80 mg by mouth 2 (two) times daily.     Tiotropium Bromide -Olodaterol (STIOLTO RESPIMAT ) 2.5-2.5 MCG/ACT AERS Inhale 2 puffs into the lungs daily. 120 each 12   torsemide (DEMADEX) 20 MG tablet Take 20 mg by mouth.  Take 1 tablet (20 mg total) by mouth 2 (two) times daily  for 3 days, THEN 1 tablet (20 mg total) once daily for 90 days.     ursodiol  (URSO  FORTE) 500 MG tablet Take 1 tablet (500 mg total) by mouth 3 (three) times daily. 270 tablet 2   venlafaxine  XR (EFFEXOR -XR) 150 MG 24 hr capsule Take 1 capsule (150 mg total) by mouth daily with breakfast. 90 capsule 4   vitamin B-12 (CYANOCOBALAMIN ) 1000 MCG tablet Take 1,000 mcg by mouth daily.     furosemide  (LASIX ) 20 MG tablet Take 1 tablet (20 mg total) by mouth daily. 90 tablet 4   No facility-administered medications prior to visit.

## 2024-08-10 ENCOUNTER — Other Ambulatory Visit: Payer: Self-pay | Admitting: Nurse Practitioner

## 2024-08-10 NOTE — Telephone Encounter (Signed)
 Requested Prescriptions  Pending Prescriptions Disp Refills   fluticasone  (FLONASE ) 50 MCG/ACT nasal spray [Pharmacy Med Name: FLUTICASONE  PROP 50 MCG SPRAY] 48 mL 1    Sig: SPRAY 2 SPRAYS INTO EACH NOSTRIL EVERY DAY     Ear, Nose, and Throat: Nasal Preparations - Corticosteroids Passed - 08/10/2024  4:32 PM      Passed - Valid encounter within last 12 months    Recent Outpatient Visits           2 months ago Depression, major, single episode, in partial remission (HCC)   [place] [facility] [place], Melanie T, NP   5 months ago Centrilobular emphysema (HCC)   [place] [facility] [place], Melanie DASEN, NP

## 2024-08-14 ENCOUNTER — Other Ambulatory Visit: Payer: Self-pay

## 2024-08-14 DIAGNOSIS — R911 Solitary pulmonary nodule: Secondary | ICD-10-CM

## 2024-08-16 DIAGNOSIS — J439 Emphysema, unspecified: Secondary | ICD-10-CM | POA: Diagnosis not present

## 2024-08-20 NOTE — Patient Instructions (Signed)
 Be Involved in Caring For Your Health:  Taking Medications When medications are taken as directed, they can greatly improve your health. But if they are not taken as prescribed, they may not work. In some cases, not taking them correctly can be harmful. To help ensure your treatment remains effective and safe, understand your medications and how to take them. Bring your medications to each visit for review by your provider.  Your lab results, notes, and after visit summary will be available on My Chart. We strongly encourage you to use this feature. If lab results are abnormal the clinic will contact you with the appropriate steps. If the clinic does not contact you assume the results are satisfactory. You can always view your results on My Chart. If you have questions regarding your health or results, please contact the clinic during office hours. You can also ask questions on My Chart.  We at [facility] are grateful that you chose [place] to provide your care. We strive to provide evidence-based and compassionate care and are always looking for feedback. If you get a survey from the clinic please complete this so we can hear your opinions.  Heart-Healthy Eating Plan Many factors influence your heart health, including eating and exercise habits. Heart health is also called coronary health. Coronary risk increases with abnormal blood fat (lipid) levels. A heart-healthy eating plan includes limiting unhealthy fats, increasing healthy fats, limiting salt (sodium) intake, and making other diet and lifestyle changes. What is my plan? Your health care provider may recommend that: You limit your fat intake to _________% or less of your total calories each day. You limit your saturated fat intake to _________% or less of your total calories each day. You limit the amount of cholesterol in your diet to less than _________ mg per day. You limit the amount of sodium in your diet to less than _________  mg per day. What are tips for following this plan? Cooking Cook foods using methods other than frying. Baking, boiling, grilling, and broiling are all good options. Other ways to reduce fat include: Removing the skin from poultry. Removing all visible fats from meats. Steaming vegetables in water or broth. Meal planning  At meals, imagine dividing your plate into fourths: Fill one-half of your plate with vegetables and green salads. Fill one-fourth of your plate with whole grains. Fill one-fourth of your plate with lean protein foods. Eat 2-4 cups of vegetables per day. One cup of vegetables equals 1 cup (91 g) broccoli or cauliflower florets, 2 medium carrots, 1 large bell pepper, 1 large sweet potato, 1 large tomato, 1 medium white potato, 2 cups (150 g) raw leafy greens. Eat 1-2 cups of fruit per day. One cup of fruit equals 1 small apple, 1 large banana, 1 cup (237 g) mixed fruit, 1 large orange,  cup (82 g) dried fruit, 1 cup (240 mL) 100% fruit juice. Eat more foods that contain soluble fiber. Examples include apples, broccoli, carrots, beans, peas, and barley. Aim to get 25-30 g of fiber per day. Increase your consumption of legumes, nuts, and seeds to 4-5 servings per week. One serving of dried beans or legumes equals  cup (90 g) cooked, 1 serving of nuts is  oz (12 almonds, 24 pistachios, or 7 walnut halves), and 1 serving of seeds equals  oz (8 g). Fats Choose healthy fats more often. Choose monounsaturated and polyunsaturated fats, such as olive and canola oils, avocado oil, flaxseeds, walnuts, almonds, and seeds. Eat  more omega-3 fats. Choose salmon, mackerel, sardines, tuna, flaxseed oil, and ground flaxseeds. Aim to eat fish at least 2 times each week. Check food labels carefully to identify foods with trans fats or high amounts of saturated fat. Limit saturated fats. These are found in animal products, such as meats, butter, and cream. Plant sources of saturated fats  include palm oil, palm kernel oil, and coconut oil. Avoid foods with partially hydrogenated oils in them. These contain trans fats. Examples are stick margarine, some tub margarines, cookies, crackers, and other baked goods. Avoid fried foods. General information Eat more home-cooked food and less restaurant, buffet, and fast food. Limit or avoid alcohol. Limit foods that are high in added sugar and simple starches such as foods made using white refined flour (white breads, pastries, sweets). Lose weight if you are overweight. Losing just 5-10% of your body weight can help your overall health and prevent diseases such as diabetes and heart disease. Monitor your sodium intake, especially if you have high blood pressure. Talk with your health care provider about your sodium intake. Try to incorporate more vegetarian meals weekly. What foods should I eat? Fruits All fresh, canned (in natural juice), or frozen fruits. Vegetables Fresh or frozen vegetables (raw, steamed, roasted, or grilled). Green salads. Grains Most grains. Choose whole wheat and whole grains most of the time. Rice and pasta, including brown rice and pastas made with whole wheat. Meats and other proteins Lean, well-trimmed beef, veal, pork, and lamb. Chicken and [place] without skin. All fish and shellfish. Wild duck, rabbit, pheasant, and venison. Egg whites or low-cholesterol egg substitutes. Dried beans, peas, lentils, and tofu. Seeds and most nuts. Dairy Low-fat or nonfat cheeses, including ricotta and mozzarella. Skim or 1% milk (liquid, powdered, or evaporated). Buttermilk made with low-fat milk. Nonfat or low-fat yogurt. Fats and oils Non-hydrogenated (trans-free) margarines. Vegetable oils, including soybean, sesame, sunflower, olive, avocado, peanut, safflower, corn, canola, and cottonseed. Salad dressings or mayonnaise made with a vegetable oil. Beverages Water (mineral or sparkling). Coffee and tea. Unsweetened ice  tea. Diet beverages. Sweets and desserts Sherbet, gelatin, and fruit ice. Small amounts of dark chocolate. Limit all sweets and desserts. Seasonings and condiments All seasonings and condiments. The items listed above may not be a complete list of foods and beverages you can eat. Contact a dietitian for more options. What foods should I avoid? Fruits Canned fruit in heavy syrup. Fruit in cream or butter sauce. Fried fruit. Limit coconut. Vegetables Vegetables cooked in cheese, cream, or butter sauce. Fried vegetables. Grains Breads made with saturated or trans fats, oils, or whole milk. Croissants. Sweet rolls. Donuts. High-fat crackers, such as cheese crackers and chips. Meats and other proteins Fatty meats, such as hot dogs, ribs, sausage, bacon, rib-eye roast or steak. High-fat deli meats, such as salami and bologna. Caviar. Domestic duck and goose. Organ meats, such as liver. Dairy Cream, sour cream, cream cheese, and creamed cottage cheese. Whole-milk cheeses. Whole or 2% milk (liquid, evaporated, or condensed). Whole buttermilk. Cream sauce or high-fat cheese sauce. Whole-milk yogurt. Fats and oils Meat fat, or shortening. Cocoa butter, hydrogenated oils, palm oil, coconut oil, palm kernel oil. Solid fats and shortenings, including bacon fat, salt pork, lard, and butter. Nondairy cream substitutes. Salad dressings with cheese or sour cream. Beverages Regular sodas and any drinks with added sugar. Sweets and desserts Frosting. Pudding. Cookies. Cakes. Pies. Milk chocolate or white chocolate. Buttered syrups. Full-fat ice cream or ice cream drinks. The items listed above may  not be a complete list of foods and beverages to avoid. Contact a dietitian for more information. Summary Heart-healthy meal planning includes limiting unhealthy fats, increasing healthy fats, limiting salt (sodium) intake and making other diet and lifestyle changes. Lose weight if you are overweight. Losing just  5-10% of your body weight can help your overall health and prevent diseases such as diabetes and heart disease. Focus on eating a balance of foods, including fruits and vegetables, low-fat or nonfat dairy, lean protein, nuts and legumes, whole grains, and heart-healthy oils and fats. This information is not intended to replace advice given to you by your health care provider. Make sure you discuss any questions you have with your health care provider. Document Revised: 12/22/2021 Document Reviewed: 12/22/2021 Elsevier Patient Education  2024 [employer].

## 2024-08-22 ENCOUNTER — Ambulatory Visit (INDEPENDENT_AMBULATORY_CARE_PROVIDER_SITE_OTHER): Admitting: Nurse Practitioner

## 2024-08-22 ENCOUNTER — Other Ambulatory Visit: Payer: Self-pay | Admitting: Nurse Practitioner

## 2024-08-22 VITALS — BP 122/70 | HR 59 | Temp 98.1°F | Resp 16 | Ht 62.99 in | Wt 260.0 lb

## 2024-08-22 DIAGNOSIS — E782 Mixed hyperlipidemia: Secondary | ICD-10-CM

## 2024-08-22 DIAGNOSIS — I10 Essential (primary) hypertension: Secondary | ICD-10-CM

## 2024-08-22 DIAGNOSIS — N1831 Chronic kidney disease, stage 3a: Secondary | ICD-10-CM | POA: Diagnosis not present

## 2024-08-22 DIAGNOSIS — F419 Anxiety disorder, unspecified: Secondary | ICD-10-CM

## 2024-08-22 DIAGNOSIS — Z23 Encounter for immunization: Secondary | ICD-10-CM

## 2024-08-22 DIAGNOSIS — J432 Centrilobular emphysema: Secondary | ICD-10-CM

## 2024-08-22 DIAGNOSIS — F17210 Nicotine dependence, cigarettes, uncomplicated: Secondary | ICD-10-CM

## 2024-08-22 DIAGNOSIS — G4733 Obstructive sleep apnea (adult) (pediatric): Secondary | ICD-10-CM | POA: Diagnosis not present

## 2024-08-22 DIAGNOSIS — R7303 Prediabetes: Secondary | ICD-10-CM | POA: Diagnosis not present

## 2024-08-22 DIAGNOSIS — F324 Major depressive disorder, single episode, in partial remission: Secondary | ICD-10-CM | POA: Diagnosis not present

## 2024-08-22 DIAGNOSIS — I739 Peripheral vascular disease, unspecified: Secondary | ICD-10-CM

## 2024-08-22 DIAGNOSIS — K743 Primary biliary cirrhosis: Secondary | ICD-10-CM | POA: Diagnosis not present

## 2024-08-22 DIAGNOSIS — I4891 Unspecified atrial fibrillation: Secondary | ICD-10-CM | POA: Diagnosis not present

## 2024-08-22 LAB — BAYER DCA HB A1C WAIVED: HB A1C (BAYER DCA - WAIVED): 5.6 % (ref 4.8–5.6)

## 2024-08-22 MED ORDER — GABAPENTIN 800 MG PO TABS: 800.0000 mg | ORAL_TABLET | Freq: Two times a day (BID) | ORAL | 4 refills | Status: AC

## 2024-08-22 MED ORDER — PREDNISONE 20 MG PO TABS: 40.0000 mg | ORAL_TABLET | Freq: Every day | ORAL | 0 refills | Status: AC

## 2024-08-22 MED ORDER — CLONAZEPAM 0.5 MG PO TABS: 0.5000 mg | ORAL_TABLET | Freq: Every day | ORAL | 2 refills | Status: DC | PRN

## 2024-08-22 MED ORDER — WEGOVY 0.25 MG/0.5ML ~~LOC~~ SOAJ: 0.2500 mg | SUBCUTANEOUS | 5 refills | Status: DC

## 2024-08-22 NOTE — Progress Notes (Signed)
 BP 122/70 (BP Location: Left Arm, Patient Position: Sitting, Cuff Size: Large)   Pulse (!) 59   Temp 98.1 F (36.7 C) (Oral)   Resp 16   Ht 5' 2.99 (1.6 m)   Wt 260 lb (117.9 kg)   LMP  (LMP Unknown)   SpO2 98%   BMI 46.07 kg/m    Subjective:    Patient ID: [name], female    DOB: [birth year], 68 y.o.   MRN: [id number]  HPI: [name] is a 68 y.o. female  Chief Complaint  Patient presents with   HTN/HLD    No home checks thought aware she should.    Mood    No concerns at this time.    Cirrhosis    Dr. Therisa says its not cirrhosis but rather her body attacking her liver.    COPD    Has a cold but otherwise doing well. SOB on exertion.    URI    Started about 3 days ago, no fevers otherwise runny nose, coughing, congestion.    ATRIAL FIBRILLATION Taking Eliquis , Sotalol, + Atorvastatin . Saw cardiology on 07/17/24, they stopped Lasix  and started Torsemide, Spironolactone, and Losartan due to elevation in BP and fluid retention.  History: Stopped Ozempic  months back as insurance stopped covering (has been off for 11 months). Had been on for one year with drastic weight loss and improvement in quality of life + chronic diseases with this on board.  Started 12/10/21 at 256 lbs and then went down to 188 lbs = total 68 pounds loss. She had met her goal. Now she is gaining back without it on board and chronic health issues are worsening. Atrial fibrillation status: stable Satisfied with current treatment: yes  Medication side effects:  no Medication compliance: good compliance Etiology of atrial fibrillation: unknown Palpitations:  no Chest pain:  no Dyspnea on exertion: at baseline Orthopnea:  no Syncope:  no Edema: no Ventricular rate control: Sotalol Anti-coagulation: long acting   Impaired Fasting Glucose HbA1C:  Lab Results  Component Value Date   HGBA1C 5.6 08/22/2024  Duration of elevated blood sugar: years Polydipsia: no Polyuria: no Weight  change: no Visual disturbance: no Glucose Monitoring: no    Accucheck frequency: Not Checking    Fasting glucose:     Post prandial:  Diabetic Education: Not Completed Family history of diabetes: yes   Clinical coverage for weight loss GLP's   Medication being dispensed is Wegovy  3 mL/28 day. Titration doses are 2 mL/28 days.   [x]  Product being prescribed is FDA approved for the indication, age, weight (if applicable) and not does not exceed dosing limits per the Prescribing Information per the clinical conditions for use. == OSA and BMI qualify her  [x]  Patient's baseline weight measured within the last 45 days as required by provider before dispensing.  [x]  Patient is weight management therapy and One of the following:   []  The beneficiary is 68 years of age or over and has ONE of the following:  [x]  A BMI greater than or equal to 30 kg/m2  []  A BMI greater than or equal to 27 kg/m2 with at least one weight-related comorbidity/risk factor/complication (i.e. hypertension, type 2 diabetes, obstructive sleep apnea, cardiovascular disease, dyslipidemia)  If patient has one weight-related comorbidity/risk factor/complication (i.e. hypertension, type 2 diabetes, obstructive sleep apnea, cardiovascular disease, dyslipidemia), please list OSA, HTN, HLD, A-Fib, COPD. Patient suffers from weight-related comorbidity/risk factor/complication as above.  [x]  The beneficiary has participated in 6 months  or greater of lifestyle modification and will continue while on medication including structured nutrition following provider recommend dietary modifications and physical activity, unless physical activity is not clinically appropriate at the time GLP1 therapy commences AND  [x]  The beneficiary will NOT be using the requested agent in combination with another GLP-1 receptor agonist agent AND  [x]  The beneficiary does NOT have any FDA-labeled contraindications to the requested agent, including  pregnancy, lactation, history of medullary thyroid  cancer or multiple endocrine neoplasia type II.   Last BMI/Weight/Height recorded Estimated body mass index is 46.07 kg/m as calculated from the following:   Height as of this encounter: 5' 2.99 (1.6 m).   Weight as of this encounter: 260 lb (117.9 kg).   COPD Aortic atherosclerosis and centrilobular emphysema noted on lung screening. Pulmonary visit last on 08/04/24, changed her to Arnuity Ellipta  + treated her with Vantin . Continues to smoke 1 PPD, has been smoking since teen years.  Has had sinus congestion for 3 days.  COPD status: stable Satisfied with current treatment?: yes Oxygen use: no Dyspnea frequency: at baseline with exertion Cough frequency: no  Rescue inhaler frequency: none Limitation of activity: no Productive cough: none Last Spirometry: unknown Pneumovax: Up to Date Influenza: Up to Date  CHRONIC KIDNEY DISEASE Recent labs stable.  Follows with Dr. Therisa for primary biliary cholangitis, last visit on 07/24/24 --- has requested she obtain Hep B vaccines.  She is to continue on Ursodiol .   CKD status: stable Medications renally dose: yes Previous renal evaluation: no Pneumovax:  Up to Date Influenza Vaccine:  Up to Date   DEPRESSION Continues Effexor  XR 150 MG daily which has offered benefit, took Duloxetine .  Continues on Buspar  20 MG TID + Clonazepam  0.5 MG daily PRN.  Use Klonopin  once a day as keeps panic away. Is not seeing pain clinic any longer due to taking benzo.  Pt is aware of risks of psychoactive medication use to include increased sedation, respiratory suppression, falls, dependence and cardiovascular events.  Pt would like to continue treatment as benefit determined to outweigh risk.  PDMP last fill 07/18/24. Mood status: stable Satisfied with current treatment?: yes Symptom severity: moderate  Duration of current treatment : chronic Side effects: no Medication compliance: good  compliance Psychotherapy/counseling: none Depressed mood: no Anxious mood: at night time Anhedonia: no Significant weight loss or gain: no Insomnia: yes, falling asleep, gets panicked, takes Klonopin  -- uses CPAP 100% of the time Fatigue: no Feelings of worthlessness or guilt: no Impaired concentration/indecisiveness: no Suicidal ideations: no Hopelessness: no Crying spells: yes    08/22/2024    2:15 PM 05/22/2024    2:47 PM 02/15/2024    2:26 PM 01/18/2024    2:05 PM 11/17/2023    3:08 PM  Depression screen PHQ 2/9  Decreased Interest 0 0 0 0 0  Down, Depressed, Hopeless 0 0 0 0 0  PHQ - 2 Score 0 0 0 0 0  Altered sleeping 0 0 0  0  Tired, decreased energy 0 0 0  0  Change in appetite 0 0 0  0  Feeling bad or failure about yourself  0 0 0  0  Trouble concentrating 0 0 0  0  Moving slowly or fidgety/restless 0 0 0  0  Suicidal thoughts 0 0 0  0  PHQ-9 Score 0 0 0  0  Difficult doing work/chores  Not difficult at all Not difficult at all  Not difficult at all  08/22/2024    2:15 PM 05/22/2024    2:47 PM 02/15/2024    2:26 PM 11/17/2023    3:08 PM  GAD 7 : Generalized Anxiety Score  Nervous, Anxious, on Edge 0 3 3 0  Control/stop worrying 0 0 0 0  Worry too much - different things 0 0 0 0  Trouble relaxing 0 0 0 0  Restless 0 0 0 0  Easily annoyed or irritable 0 0 0 0  Afraid - awful might happen 0 0 0 0  Total GAD 7 Score 0 3 3 0  Anxiety Difficulty  Not difficult at all Not difficult at all Not difficult at all   Relevant past medical, surgical, family and social history reviewed and updated as indicated. Interim medical history since our last visit reviewed. Allergies and medications reviewed and updated.  Review of Systems  Constitutional:  Negative for activity change, appetite change, diaphoresis, fatigue and fever.  HENT:  Negative for ear discharge and ear pain.   Respiratory:  Negative for cough, chest tightness, shortness of breath and wheezing.    Cardiovascular:  Negative for chest pain, palpitations and leg swelling.  Gastrointestinal: Negative.   Musculoskeletal:  Positive for arthralgias and back pain.  Neurological: Negative.   Psychiatric/Behavioral:  Negative for decreased concentration, self-injury, sleep disturbance and suicidal ideas. The patient is nervous/anxious.    Per HPI unless specifically indicated above     Objective:    BP 122/70 (BP Location: Left Arm, Patient Position: Sitting, Cuff Size: Large)   Pulse (!) 59   Temp 98.1 F (36.7 C) (Oral)   Resp 16   Ht 5' 2.99 (1.6 m)   Wt 260 lb (117.9 kg)   LMP  (LMP Unknown)   SpO2 98%   BMI 46.07 kg/m   Wt Readings from Last 3 Encounters:  08/22/24 260 lb (117.9 kg)  08/04/24 257 lb (116.6 kg)  05/22/24 256 lb 9.6 oz (116.4 kg)    Physical Exam Vitals and nursing note reviewed.  Constitutional:      General: She is awake. She is not in acute distress.    Appearance: She is well-developed and well-groomed. She is obese. She is not ill-appearing or toxic-appearing.  HENT:     Head: Normocephalic.     Right Ear: Hearing and external ear normal.     Left Ear: Hearing and external ear normal.  Eyes:     General: Lids are normal.        Right eye: No discharge.        Left eye: No discharge.     Conjunctiva/sclera: Conjunctivae normal.     Pupils: Pupils are equal, round, and reactive to light.  Neck:     Thyroid : No thyromegaly.     Vascular: No carotid bruit.  Cardiovascular:     Rate and Rhythm: Normal rate and regular rhythm.     Heart sounds: Normal heart sounds. No murmur heard.    No gallop.  Pulmonary:     Effort: Pulmonary effort is normal. No accessory muscle usage or respiratory distress.     Breath sounds: Normal breath sounds.  Abdominal:     General: Bowel sounds are normal. There is no distension.     Palpations: Abdomen is soft.     Tenderness: There is no abdominal tenderness.  Musculoskeletal:     Cervical back: Normal range  of motion and neck supple.     Right lower leg: 1+ Pitting Edema present.  Left lower leg: 1+ Pitting Edema present.  Lymphadenopathy:     Cervical: No cervical adenopathy.  Skin:    General: Skin is warm and dry.  Neurological:     Mental Status: She is alert and oriented to person, place, and time.     Deep Tendon Reflexes: Reflexes are normal and symmetric.     Reflex Scores:      Brachioradialis reflexes are 2+ on the right side and 2+ on the left side.      Patellar reflexes are 2+ on the right side and 2+ on the left side. Psychiatric:        Attention and Perception: Attention normal.        Mood and Affect: Mood normal.        Speech: Speech normal.        Behavior: Behavior normal. Behavior is cooperative.        Thought Content: Thought content normal.    Results for orders placed or performed in visit on 08/22/24  Bayer DCA Hb A1c Waived   Collection Time: 08/22/24  2:41 PM  Result Value Ref Range   HB A1C (BAYER DCA - WAIVED) 5.6 4.8 - 5.6 %      Assessment & Plan:   Problem List Items Addressed This Visit       Cardiovascular and Mediastinum   PVD (peripheral vascular disease)   Chronic, evidenced by decreased hair pattern and baseline edema.  Is long time smoker, recommend complete cessation.  Consider vascular visit in future.  At this time monitor skin closely for wounds and wear compression hose daily.      Relevant Medications   spironolactone (ALDACTONE) 25 MG tablet   Benign hypertension   Chronic, stable.  BP at goal today.  Continue current medication regimen and adjust as needed -- is taking BB and Lasix  only at this time.  Previously took Telmisartan  and HCTZ.  Recommend she monitor BP at home daily and document for provider + focus on DASH diet.  LABS: BMP and CBC.  Urine ALB 29 December 2022, may benefit from return to low dose ARB in future.        Relevant Medications   spironolactone (ALDACTONE) 25 MG tablet   Atrial fibrillation with RVR  (HCC)   Chronic, stable.  Diagnosed 04/14/22 in hospital.  At this time HR stable and no symptoms.  Continue collaboration with cardiology and current medication regimen, adjust as needed.        Relevant Medications   spironolactone (ALDACTONE) 25 MG tablet     Respiratory   Sleep apnea   Chronic, ongoing -- continue 100% use of this.  Would benefit restarting Semaglutide  due to benefit with OSA, is FDA approved for this + her BMI.  Her health and mobility overall improved with this on board in past. Will attempt to get coverage.      Centrilobular emphysema (HCC) - Primary   Chronic, ongoing.  Continue current medication regimen and adjust as needed.  Not O2 dependent at this time.  Continue to recommend complete smoking cessation. Continue collaboration with pulmonary, appreciate their input.  Recent note reviewed. Would benefit restarting Semaglutide  due to benefit with OSA, is FDA approved for this + her BMI.  Her health, breathing, and mobility overall improved with this on board in past. Will attempt to get coverage.      Relevant Medications   predniSONE  (DELTASONE ) 20 MG tablet   Other Relevant Orders   CBC with Differential/Platelet  Digestive   Primary biliary cholangitis (HCC)   Chronic, ongoing. Followed by GI, continue this collaboration. Recent noted reviewed. Acute illness present today, plan on starting Hep B series next visit.        Genitourinary   Chronic kidney disease, stage 3a (HCC)   Ongoing.  Reminded to avoid NSAIDs.  May need to adjust Gabapentin  based on labs, will review and determine need to reduce -- discussed with patient.  Labs today: BMP and CBC.  Increase fluid intake at home.      Relevant Orders   CBC with Differential/Platelet   Basic metabolic panel with GFR     Other   Prediabetes (Chronic)   Ongoing with A1c ranging from 5.7 to 5.9%, last 5.9% without Semaglutide  on board.  Insurance no longer covering Ozempic  which offered her a lot of  benefit.  Would benefit restarting Semaglutide  due to benefit with OSA, is FDA approved for this + her BMI.  Her health and mobility overall improved with this on board in past. Will attempt to get covered.      Relevant Orders   Bayer DCA Hb A1c Waived (Completed)   Nicotine  dependence, cigarettes, uncomplicated   I have recommended complete cessation of tobacco use. I have discussed various options available for assistance with tobacco cessation including over the counter methods (Nicotine  gum, patch and lozenges). We also discussed prescription options (Chantix, Nicotine  Inhaler / Nasal Spray). The patient is not interested in pursuing any prescription tobacco cessation options at this time.  Continue annual lung screening.       Morbid obesity (HCC)   BMI 46.07 with OSA, HTN and COPD, A-Fib.  Recommended eating smaller high protein, low fat meals more frequently and exercising 30 mins a day 5 times a week with a goal of 10-15lb weight loss in the next 3 months. Patient voiced their understanding and motivation to adhere to these recommendations.  Tolerated Ozempic  with total 68 pounds loss, but insurance stopped covering and she is now gaining weight back. Would benefit restarting Semaglutide  due to benefit with OSA, is FDA approved for this + her BMI.  Her health and mobility overall improved with this on board in past. Will attempt to get coverage for this.       Relevant Medications   semaglutide -weight management (WEGOVY ) 0.25 MG/0.5ML SOAJ SQ injection   Hyperlipidemia   Chronic, ongoing.  Continue statin and adjust as needed -- Atorvastatin  80 MG.  Lipid panel today.      Relevant Medications   spironolactone (ALDACTONE) 25 MG tablet   Other Relevant Orders   Lipid Panel w/o Chol/HDL Ratio   Depression, major, single episode, in partial remission   Chronic, stable at this time. Denies SI/HI. Continue Effexor  XL 150 MG daily + Buspar  and Vistaril .  Continue at lowest dosing,  Klonopin  0.5 MG daily as needed only -- take only as needed.  Aware of need for every 3 month visit, yearly drug screen, and controlled substance agreement.  Controlled substance agreement up to date and UDS due 05/16/24.  Consider Prozac , as worked well in past, but this could interact with cardiac medications (QT prolongation) - will not initiate this at this time.  Consider psychiatry referral in future.  Monitor BP with Effexor .      Anxiety   Refer to depression plan of care.         Follow up plan: Return in about 3 months (around 11/21/2024) for ANXIETY, HTN/HLD.

## 2024-08-22 NOTE — Assessment & Plan Note (Signed)
Chronic, stable.  Diagnosed 04/14/22 in hospital.  At this time HR stable and no symptoms.  Continue collaboration with cardiology and current medication regimen, adjust as needed.

## 2024-08-22 NOTE — Assessment & Plan Note (Signed)
 Refer to depression plan of care.

## 2024-08-22 NOTE — Assessment & Plan Note (Signed)
 Chronic, ongoing.  Continue current medication regimen and adjust as needed.  Not O2 dependent at this time.  Continue to recommend complete smoking cessation. Continue collaboration with pulmonary, appreciate their input.  Recent note reviewed. Would benefit restarting Semaglutide  due to benefit with OSA, is FDA approved for this + her BMI.  Her health, breathing, and mobility overall improved with this on board in past. Will attempt to get coverage.

## 2024-08-22 NOTE — Assessment & Plan Note (Addendum)
 Chronic, ongoing -- continue 100% use of this.  Would benefit restarting Semaglutide  due to benefit with OSA, is FDA approved for this + her BMI.  Her health and mobility overall improved with this on board in past. Will attempt to get coverage.

## 2024-08-22 NOTE — Assessment & Plan Note (Signed)
Chronic, ongoing.  Continue statin and adjust as needed -- Atorvastatin 80 MG.  Lipid panel today. 

## 2024-08-22 NOTE — Assessment & Plan Note (Signed)
 Ongoing.  Reminded to avoid NSAIDs.  May need to adjust Gabapentin  based on labs, will review and determine need to reduce -- discussed with patient.  Labs today: BMP and CBC.  Increase fluid intake at home.

## 2024-08-22 NOTE — Assessment & Plan Note (Signed)
 Chronic, stable.  BP at goal today.  Continue current medication regimen and adjust as needed -- is taking BB and Lasix  only at this time.  Previously took Telmisartan  and HCTZ.  Recommend she monitor BP at home daily and document for provider + focus on DASH diet.  LABS: BMP and CBC.  Urine ALB 29 December 2022, may benefit from return to low dose ARB in future.

## 2024-08-22 NOTE — Assessment & Plan Note (Signed)
 Ongoing with A1c ranging from 5.7 to 5.9%, last 5.9% without Semaglutide  on board.  Insurance no longer covering Ozempic  which offered her a lot of benefit.  Would benefit restarting Semaglutide  due to benefit with OSA, is FDA approved for this + her BMI.  Her health and mobility overall improved with this on board in past. Will attempt to get covered.

## 2024-08-22 NOTE — Assessment & Plan Note (Signed)
I have recommended complete cessation of tobacco use. I have discussed various options available for assistance with tobacco cessation including over the counter methods (Nicotine gum, patch and lozenges). We also discussed prescription options (Chantix, Nicotine Inhaler / Nasal Spray). The patient is not interested in pursuing any prescription tobacco cessation options at this time.  Continue annual lung screening. 

## 2024-08-22 NOTE — Assessment & Plan Note (Addendum)
 Chronic, ongoing. Followed by GI, continue this collaboration. Recent noted reviewed. Acute illness present today, plan on starting Hep B series next visit.

## 2024-08-22 NOTE — Assessment & Plan Note (Signed)
Chronic, evidenced by decreased hair pattern and baseline edema.  Is long time smoker, recommend complete cessation.  Consider vascular visit in future.  At this time monitor skin closely for wounds and wear compression hose daily.

## 2024-08-22 NOTE — Assessment & Plan Note (Signed)
 BMI 46.07 with OSA, HTN and COPD, A-Fib.  Recommended eating smaller high protein, low fat meals more frequently and exercising 30 mins a day 5 times a week with a goal of 10-15lb weight loss in the next 3 months. Patient voiced their understanding and motivation to adhere to these recommendations.  Tolerated Ozempic  with total 68 pounds loss, but insurance stopped covering and she is now gaining weight back. Would benefit restarting Semaglutide  due to benefit with OSA, is FDA approved for this + her BMI.  Her health and mobility overall improved with this on board in past. Will attempt to get coverage for this.

## 2024-08-22 NOTE — Assessment & Plan Note (Signed)
Chronic, stable at this time. Denies SI/HI.  Continue Effexor XL 150 MG daily + Buspar and Vistaril.  Continue at lowest dosing, Klonopin 0.5 MG daily as needed only -- take only as needed.  Aware of need for every 3 month visit, yearly drug screen, and controlled substance agreement.  Controlled substance agreement up to date and UDS due 05/16/24.  Consider Prozac, as worked well in past, but this could interact with cardiac medications (QT prolongation) - will not initiate this at this time.  Consider psychiatry referral in future.  Monitor BP with Effexor.

## 2024-08-23 ENCOUNTER — Ambulatory Visit: Payer: Self-pay | Admitting: Nurse Practitioner

## 2024-08-23 DIAGNOSIS — D508 Other iron deficiency anemias: Secondary | ICD-10-CM

## 2024-08-23 LAB — CBC WITH DIFFERENTIAL/PLATELET
Basophils Absolute: 0.1 x10E3/uL (ref 0.0–0.2)
Basos: 1 %
EOS (ABSOLUTE): 0.2 x10E3/uL (ref 0.0–0.4)
Eos: 2 %
Hematocrit: 32.7 % — ABNORMAL LOW (ref 34.0–46.6)
Hemoglobin: 10.4 g/dL — ABNORMAL LOW (ref 11.1–15.9)
Immature Grans (Abs): 0 x10E3/uL (ref 0.0–0.1)
Immature Granulocytes: 0 %
Lymphocytes Absolute: 2.8 x10E3/uL (ref 0.7–3.1)
Lymphs: 33 %
MCH: 30.9 pg (ref 26.6–33.0)
MCHC: 31.8 g/dL (ref 31.5–35.7)
MCV: 97 fL (ref 79–97)
Monocytes Absolute: 1.2 x10E3/uL — ABNORMAL HIGH (ref 0.1–0.9)
Monocytes: 14 %
Neutrophils Absolute: 4.3 x10E3/uL (ref 1.4–7.0)
Neutrophils: 50 %
Platelets: 201 x10E3/uL (ref 150–450)
RBC: 3.37 x10E6/uL — ABNORMAL LOW (ref 3.77–5.28)
RDW: 12.5 % (ref 11.7–15.4)
WBC: 8.6 x10E3/uL (ref 3.4–10.8)

## 2024-08-23 LAB — BASIC METABOLIC PANEL WITH GFR
BUN/Creatinine Ratio: 19 (ref 12–28)
BUN: 21 mg/dL (ref 8–27)
CO2: 20 mmol/L (ref 20–29)
Calcium: 9 mg/dL (ref 8.7–10.3)
Chloride: 105 mmol/L (ref 96–106)
Creatinine, Ser: 1.11 mg/dL — ABNORMAL HIGH (ref 0.57–1.00)
Glucose: 99 mg/dL (ref 70–99)
Potassium: 5.1 mmol/L (ref 3.5–5.2)
Sodium: 139 mmol/L (ref 134–144)
eGFR: 54 mL/min/1.73 — ABNORMAL LOW (ref >59)

## 2024-08-23 LAB — LIPID PANEL W/O CHOL/HDL RATIO
Cholesterol, Total: 129 mg/dL (ref 100–199)
HDL: 50 mg/dL (ref >39)
LDL Chol Calc (NIH): 58 mg/dL (ref 0–99)
Triglycerides: 117 mg/dL (ref 0–149)
VLDL Cholesterol Cal: 21 mg/dL (ref 5–40)

## 2024-08-23 NOTE — Telephone Encounter (Signed)
 Requested medication (s) are due for refill today: yes  Requested medication (s) are on the active medication list: yes  Last refill:  08/23/23  Future visit scheduled: {Yes  Notes to clinic:  Pharmacy comment: Alternative Requested:NOT COVERED.      Requested Prescriptions  Pending Prescriptions Disp Refills   WEGOVY  0.25 MG/0.5ML SOAJ SQ injection [Pharmacy Med Name: WEGOVY  0.25 MG/0.5 ML PEN]  5    Sig: INJECT 0.25MG  INTO THE SKIN ONE TIME PER WEEK     Endocrinology:  Diabetes - GLP-1 Receptor Agonists - semaglutide  Failed - 08/23/2024  3:41 PM      Failed - Cr in normal range and within 360 days    Creatinine  Date Value Ref Range Status  05/17/2023 20.8 20.0 - 300.0 mg/dL Final  95/75/7984 9.00 0.60 - 1.30 mg/dL Final   Creatinine, Ser  Date Value Ref Range Status  08/22/2024 1.11 (H) 0.57 - 1.00 mg/dL Final         Passed - HBA1C in normal range and within 180 days    HB A1C (BAYER DCA - WAIVED)  Date Value Ref Range Status  08/22/2024 5.6 4.8 - 5.6 % Final    Comment:             Prediabetes: 5.7 - 6.4          Diabetes: >6.4          Glycemic control for adults with diabetes: <7.0          Passed - Valid encounter within last 6 months    Recent Outpatient Visits           Yesterday Centrilobular emphysema (HCC)   [place] [facility] [place], [place] T, NP   3 months ago Depression, major, single episode, in partial remission   [place] [facility] [place], [place] T, NP   6 months ago Centrilobular emphysema [facility])   [place] [facility] [place], [place] DASEN, NP

## 2024-08-23 NOTE — Progress Notes (Signed)
 Called patient and left a message for her to call back to get scheduled for labs in 3 weeks.

## 2024-08-23 NOTE — Progress Notes (Signed)
 Contacted via MyChart -- lab only visit in 4 weeks please  Good afternoon [name], your labs have returned: - Kidney function, creatinine and eGFR, shows mild reduction this visit. We will continue to monitor. - CBC shows hemoglobin and hematocrit trending down a little. Are you seeing hematology soon? Let me know.  If not I would like to recheck this in 4 weeks outpatient. - Lipid panel shows levels at goal. Any questions? Keep being amazing!!  Thank you for allowing me to participate in your care.  I appreciate you. Kindest regards, Dymphna Wadley

## 2024-08-23 NOTE — Progress Notes (Signed)
 Scheduled

## 2024-08-29 ENCOUNTER — Other Ambulatory Visit: Payer: Self-pay | Admitting: Student in an Organized Health Care Education/Training Program

## 2024-08-29 DIAGNOSIS — J439 Emphysema, unspecified: Secondary | ICD-10-CM

## 2024-08-30 ENCOUNTER — Encounter: Payer: Self-pay | Admitting: Oncology

## 2024-09-01 ENCOUNTER — Encounter: Payer: Self-pay | Admitting: Oncology

## 2024-09-01 ENCOUNTER — Inpatient Hospital Stay: Attending: Oncology

## 2024-09-01 ENCOUNTER — Inpatient Hospital Stay: Admitting: Oncology

## 2024-09-01 VITALS — BP 122/50 | HR 55 | Temp 98.6°F | Resp 17 | Wt 261.7 lb

## 2024-09-01 DIAGNOSIS — D509 Iron deficiency anemia, unspecified: Secondary | ICD-10-CM

## 2024-09-01 DIAGNOSIS — Z79899 Other long term (current) drug therapy: Secondary | ICD-10-CM | POA: Insufficient documentation

## 2024-09-01 DIAGNOSIS — M810 Age-related osteoporosis without current pathological fracture: Secondary | ICD-10-CM | POA: Diagnosis not present

## 2024-09-01 LAB — CBC WITH DIFFERENTIAL (CANCER CENTER ONLY)
Abs Immature Granulocytes: 0.05 K/uL (ref 0.00–0.07)
Basophils Absolute: 0.1 K/uL (ref 0.0–0.1)
Basophils Relative: 0 %
Eosinophils Absolute: 0.2 K/uL (ref 0.0–0.5)
Eosinophils Relative: 2 %
HCT: 35 % — ABNORMAL LOW (ref 36.0–46.0)
Hemoglobin: 11 g/dL — ABNORMAL LOW (ref 12.0–15.0)
Immature Granulocytes: 0 %
Lymphocytes Relative: 33 %
Lymphs Abs: 3.7 K/uL (ref 0.7–4.0)
MCH: 30.7 pg (ref 26.0–34.0)
MCHC: 31.4 g/dL (ref 30.0–36.0)
MCV: 97.8 fL (ref 80.0–100.0)
Monocytes Absolute: 1.3 K/uL — ABNORMAL HIGH (ref 0.1–1.0)
Monocytes Relative: 12 %
Neutro Abs: 5.8 K/uL (ref 1.7–7.7)
Neutrophils Relative %: 53 %
Platelet Count: 228 K/uL (ref 150–400)
RBC: 3.58 MIL/uL — ABNORMAL LOW (ref 3.87–5.11)
RDW: 13.9 % (ref 11.5–15.5)
WBC Count: 11.2 K/uL — ABNORMAL HIGH (ref 4.0–10.5)
nRBC: 0 % (ref 0.0–0.2)

## 2024-09-01 LAB — IRON AND TIBC
Iron: 226 ug/dL — ABNORMAL HIGH (ref 28–170)
Saturation Ratios: 59 % — ABNORMAL HIGH (ref 10.4–31.8)
TIBC: 385 ug/dL (ref 250–450)
UIBC: 159 ug/dL

## 2024-09-01 LAB — FERRITIN: Ferritin: 16 ng/mL (ref 11–307)

## 2024-09-03 ENCOUNTER — Encounter: Payer: Self-pay | Admitting: Oncology

## 2024-09-03 ENCOUNTER — Ambulatory Visit: Payer: Self-pay | Admitting: Oncology

## 2024-09-03 NOTE — Progress Notes (Signed)
 Hematology/Oncology Consult note [facility]  [phone] [phone]  Patient Care Team: Cannady, Jolene T, NP as PCP - General (Nurse Practitioner) Melanee Annah BROCKS, MD as Consulting Physician (Oncology) Florencio Cara BIRCH, MD as Consulting Physician (Cardiology) Isadora Hose, MD as Consulting Physician (Pulmonary Disease) Therisa Bi, MD as Consulting Physician (Gastroenterology)   Name of the patient: [name]  [id number]  [birth year]   Date of visit: 09/03/24  Diagnosis-iron deficiency anemia  Chief complaint/ Reason for visit-routine follow-up of iron deficiency anemia  Heme/Onc history: Patient is a 68 year old female who was seen hematology in the past for leukocytosis/lymphocytosis which is thought to be reactive secondary to smoking.  She has had flow cytometry, BCR-ABL FISH testing back in 2021 which were unremarkable.  White cell count mostly fluctuates between 11-16.  Hemoglobin has been normal up until early 2023.  She has now been referred for worsening anemia.  She received 1 dose of Monoferric  in February 2025.   Interval history-patient reestablished care with Dr. Therisa for her primary biliary cirrhosis with [facility].  Presently she feels well and denies any specific complaints at this time  ECOG PS- 1 Pain scale- 0   Review of systems- Review of Systems  Constitutional:  Positive for malaise/fatigue. Negative for chills, fever and weight loss.  HENT:  Negative for congestion, ear discharge and nosebleeds.   Eyes:  Negative for blurred vision.  Respiratory:  Negative for cough, hemoptysis, sputum production, shortness of breath and wheezing.   Cardiovascular:  Negative for chest pain, palpitations, orthopnea and claudication.  Gastrointestinal:  Negative for abdominal pain, blood in stool, constipation, diarrhea, heartburn, melena, nausea and vomiting.  Genitourinary:  Negative for dysuria, flank pain, frequency,  hematuria and urgency.  Musculoskeletal:  Negative for back pain, joint pain and myalgias.  Skin:  Negative for rash.  Neurological:  Negative for dizziness, tingling, focal weakness, seizures, weakness and headaches.  Endo/Heme/Allergies:  Does not bruise/bleed easily.  Psychiatric/Behavioral:  Negative for depression and suicidal ideas. The patient does not have insomnia.       Allergies  Allergen Reactions   Iodinated Contrast Media Rash    Had rash present to neck with PET scan contrast     Past Medical History:  Diagnosis Date   Anxiety    Aortic atherosclerosis    Arthritis    Atrial fibrillation with RVR (HCC)    a.) CHA2DS2VASc = 4 (age, sex, HTN, vascular disease history);  b.) rate/rhythm maintained on oral sotolol; chronically anticoagulated with apixaban    Atypical chest pain    Benign essential tremor    Chronic kidney disease, stage 3a (HCC)    COPD (chronic obstructive pulmonary disease) (HCC)    DDD (degenerative disc disease), cervical    a.) s/p ACDF C6-C7   DDD (degenerative disc disease), lumbar    Depression    Emphysema of lung (HCC) 2019   mild   Enlarged heart    Fatty liver    GERD (gastroesophageal reflux disease)    Hiatal hernia    Hyperlipidemia    Hypertension    Insomnia    a.) takes melatonin PRN   Long term current use of anticoagulant    a.) apixaban    Mass of right adrenal gland    a.) CT AP 08/12/2022: measured 3.3 cm   Monocytosis    Morbid obesity (HCC)    Nicotine  dependence    OSA on CPAP    Osteoporosis    Pancreatitis  Peripheral vascular disease    Psoriasis    SOB (shortness of breath)    Umbilical hernia    Vitamin D  deficiency      Past Surgical History:  Procedure Laterality Date   ABDOMINAL HYSTERECTOMY     ACHILLES TENDON REPAIR     Removed bone spur and repaired achilles tendon   ANTERIOR CERVICAL DECOMP/DISCECTOMY FUSION     APPENDECTOMY     COLONOSCOPY N/A 03/14/2024   Procedure: COLONOSCOPY;   Surgeon: Therisa Bi, MD;  Location: [facility] ENDOSCOPY;  Service: Gastroenterology;  Laterality: N/A;   COLONOSCOPY WITH PROPOFOL  N/A 01/17/2019   Procedure: COLONOSCOPY WITH PROPOFOL ;  Surgeon: Therisa Bi, MD;  Location: [facility] ENDOSCOPY;  Service: Gastroenterology;  Laterality: N/A;   ESOPHAGOGASTRODUODENOSCOPY N/A 03/14/2024   Procedure: EGD (ESOPHAGOGASTRODUODENOSCOPY);  Surgeon: Therisa Bi, MD;  Location: [facility] ENDOSCOPY;  Service: Gastroenterology;  Laterality: N/A;   FLEXIBLE BRONCHOSCOPY Bilateral 04/09/2023   Procedure: FLEXIBLE BRONCHOSCOPY;  Surgeon: Isadora Hose, MD;  Location: [facility] ORS;  Service: Pulmonary;  Laterality: Bilateral;   POLYPECTOMY  03/14/2024   Procedure: POLYPECTOMY, INTESTINE;  Surgeon: Therisa Bi, MD;  Location: [facility] ENDOSCOPY;  Service: Gastroenterology;;   TONSILLECTOMY      Social History   Socioeconomic History   Marital status: Widowed    Spouse name: Not on file   Number of children: 1   Years of education: Not on file   Highest education level: GED or equivalent  Occupational History   Occupation: disabled  Tobacco Use   Smoking status: Every Day    Current packs/day: 1.00    Average packs/day: 1 pack/day for 53.8 years (53.8 ttl pk-yrs)    Types: Cigarettes    Start date: 66    Passive exposure: Past   Smokeless tobacco: Never   Tobacco comments:    Smoking 0.5-1 ppd.  She has used patches/lozenges and they did not help.  08/04/2024 khj  Vaping Use   Vaping status: Never Used  Substance and Sexual Activity   Alcohol use: No    Alcohol/week: 0.0 standard drinks of alcohol   Drug use: No   Sexual activity: Never    Partners: Male  Other Topics Concern   Not on file  Social History Narrative   Not on file   Social Drivers of Health   Financial Resource Strain: Low Risk  (08/22/2024)   Overall Financial Resource Strain (CARDIA)    Difficulty of Paying Living Expenses: Not hard at all  Food Insecurity: No Food Insecurity (08/22/2024)    Hunger Vital Sign    Worried About Running Out of Food in the Last Year: Never true    Ran Out of Food in the Last Year: Never true  Transportation Needs: No Transportation Needs (08/22/2024)   PRAPARE - Administrator, Civil Service (Medical): No    Lack of Transportation (Non-Medical): No  Physical Activity: Inactive (08/22/2024)   Exercise Vital Sign    Days of Exercise per Week: 0 days    Minutes of Exercise per Session: Not on file  Stress: No Stress Concern Present (08/22/2024)   [employer] of Occupational Health - Occupational Stress Questionnaire    Feeling of Stress: Only a little  Social Connections: Socially Isolated (08/22/2024)   Social Connection and Isolation Panel    Frequency of Communication with Friends and Family: More than three times a week    Frequency of Social Gatherings with Friends and Family: More than three times a week    Attends Religious  Services: Never    Active Member of Clubs or Organizations: No    Attends Banker Meetings: Not on file    Marital Status: Widowed  Intimate Partner Violence: Not At Risk (01/18/2024)   Humiliation, Afraid, Rape, and Kick questionnaire    Fear of Current or Ex-Partner: No    Emotionally Abused: No    Physically Abused: No    Sexually Abused: No    Family History  Problem Relation Age of Onset   COPD Father    Heart disease Father    Hyperlipidemia Father    Hypertension Father    Dementia Father    Alcohol abuse Father    Cancer Sister    Hyperlipidemia Sister    Hypertension Sister    Lymphoma Sister    Migraines Son    Stroke Maternal Grandfather    Heart attack Brother    Cancer Brother        colorectal and liver   Obesity Mother    Breast cancer Paternal Aunt    Cancer Paternal Aunt    Breast cancer Paternal Aunt    Diabetes Brother      Current Outpatient Medications:    acetaminophen  (TYLENOL ) 500 MG tablet, Take 1,000 mg by mouth in the morning and at bedtime.,  Disp: , Rfl:    albuterol  (PROAIR  HFA) 108 (90 Base) MCG/ACT inhaler, Inhale 2 puffs into the lungs every 6 (six) hours as needed for wheezing or shortness of breath., Disp: 18 g, Rfl: 4   albuterol  (PROVENTIL ) (2.5 MG/3ML) 0.083% nebulizer solution, Take 3 mLs (2.5 mg total) by nebulization in the morning and at bedtime., Disp: 75 mL, Rfl: 12   apixaban  (ELIQUIS ) 5 MG TABS tablet, Take 1 tablet (5 mg total) by mouth 2 (two) times daily., Disp: 60 tablet, Rfl: 1   ascorbic acid (VITAMIN C) 500 MG tablet, Take 500 mg by mouth daily., Disp: , Rfl:    atorvastatin  (LIPITOR ) 80 MG tablet, Take 1 tablet (80 mg total) by mouth daily., Disp: 90 tablet, Rfl: 4   baclofen  (LIORESAL ) 10 MG tablet, TAKE 1 TABLET BY MOUTH THREE TIMES A DAY, Disp: 270 tablet, Rfl: 2   busPIRone  (BUSPAR ) 10 MG tablet, Take 2 tablets (20 mg total) by mouth 3 (three) times daily., Disp: 540 tablet, Rfl: 2   Calcium  Citrate-Vitamin D  (CALCIUM  + D PO), Take 1,000 mg by mouth daily., Disp: , Rfl:    Cholecalciferol  (VITAMIN D ) 50 MCG (2000 UT) CAPS, Take by mouth., Disp: , Rfl:    clonazePAM  (KLONOPIN ) 0.5 MG tablet, Take 1 tablet (0.5 mg total) by mouth daily as needed for anxiety., Disp: 30 tablet, Rfl: 2   Ferrous Sulfate Dried (SM SLOW RELEASE IRON) 143 (45 Fe) MG TBCR, Take 1 tablet by mouth daily., Disp: , Rfl:    fluticasone  (FLONASE ) 50 MCG/ACT nasal spray, SPRAY 2 SPRAYS INTO EACH NOSTRIL EVERY DAY, Disp: 48 mL, Rfl: 1   Fluticasone  Furoate (ARNUITY ELLIPTA ) 50 MCG/ACT AEPB, Inhale 1 puff into the lungs daily., Disp: 60 each, Rfl: 11   gabapentin  (NEURONTIN ) 800 MG tablet, Take 1 tablet (800 mg total) by mouth in the morning and at bedtime., Disp: 180 tablet, Rfl: 4   hydrOXYzine  (ATARAX ) 10 MG tablet, Take 1 tablet (10 mg total) by mouth 3 (three) times daily as needed., Disp: 270 tablet, Rfl: 4   losartan (COZAAR) 50 MG tablet, Take 50 mg by mouth daily., Disp: , Rfl:    Melatonin 10 MG TABS,  Take 1 tablet by mouth at  bedtime., Disp: , Rfl:    Multiple Vitamins-Minerals (MULTIVITAMIN WITH MINERALS) tablet, Take 1 tablet by mouth daily., Disp: , Rfl:    nicotine  (NICODERM CQ  - DOSED IN MG/24 HOURS) 21 mg/24hr patch, Place 1 patch (21 mg total) onto the skin daily., Disp: 42 patch, Rfl: 0   nicotine  polacrilex (NICOTINE  MINI) 2 MG lozenge, Take 1 lozenge (2 mg total) by mouth every 2 (two) hours as needed for smoking cessation., Disp: 72 lozenge, Rfl: 3   omeprazole  (PRILOSEC) 40 MG capsule, Take 1 capsule (40 mg total) by mouth daily., Disp: 90 capsule, Rfl: 4   potassium chloride  SA (KLOR-CON  M20) 20 MEQ tablet, Take 1 tablet (20 mEq total) by mouth daily., Disp: 90 tablet, Rfl: 4   semaglutide -weight management (WEGOVY ) 0.25 MG/0.5ML SOAJ SQ injection, Inject 0.25 mg into the skin once a week., Disp: 2 mL, Rfl: 5   sotalol (BETAPACE) 80 MG tablet, Take 80 mg by mouth 2 (two) times daily., Disp: , Rfl:    spironolactone (ALDACTONE) 25 MG tablet, Take 25 mg by mouth daily., Disp: , Rfl:    Tiotropium Bromide -Olodaterol (STIOLTO RESPIMAT ) 2.5-2.5 MCG/ACT AERS, INHALE 2 PUFFS BY MOUTH INTO THE LUNGS DAILY, Disp: 60 each, Rfl: PRN   torsemide (DEMADEX) 20 MG tablet, Take 20 mg by mouth.  Take 1 tablet (20 mg total) by mouth 2 (two) times daily for 3 days, THEN 1 tablet (20 mg total) once daily for 90 days., Disp: , Rfl:    ursodiol  (URSO  FORTE) 500 MG tablet, Take 1 tablet (500 mg total) by mouth 3 (three) times daily., Disp: 270 tablet, Rfl: 2   venlafaxine  XR (EFFEXOR -XR) 150 MG 24 hr capsule, Take 1 capsule (150 mg total) by mouth daily with breakfast., Disp: 90 capsule, Rfl: 4   vitamin B-12 (CYANOCOBALAMIN ) 1000 MCG tablet, Take 1,000 mcg by mouth daily., Disp: , Rfl:   Physical exam:  Vitals:   09/01/24 1451  BP: (!) 122/50  Pulse: (!) 55  Resp: 17  Temp: 98.6 F (37 C)  SpO2: 96%  Weight: 261 lb 11.2 oz (118.7 kg)   Physical Exam Eyes:     Pupils: Pupils are equal, round, and reactive to light.   Cardiovascular:     Rate and Rhythm: Normal rate and regular rhythm.     Heart sounds: Normal heart sounds.  Pulmonary:     Effort: Pulmonary effort is normal.     Breath sounds: Normal breath sounds.  Skin:    General: Skin is warm and dry.  Neurological:     Mental Status: She is alert and oriented to person, place, and time.      I have personally reviewed labs listed below:    Latest Ref Rng & Units 08/22/2024    2:42 PM  CMP  Glucose 70 - 99 mg/dL 99   BUN 8 - 27 mg/dL 21   Creatinine 9.42 - 1.00 mg/dL 8.88   Sodium 865 - 855 mmol/L 139   Potassium 3.5 - 5.2 mmol/L 5.1   Chloride 96 - 106 mmol/L 105   CO2 20 - 29 mmol/L 20   Calcium  8.7 - 10.3 mg/dL 9.0       Latest Ref Rng & Units 09/01/2024    2:25 PM  CBC  WBC 4.0 - 10.5 K/uL 11.2   Hemoglobin 12.0 - 15.0 g/dL 88.9   Hematocrit 63.9 - 46.0 % 35.0   Platelets 150 - 400 K/uL 228  Assessment and plan- Patient is a 68 y.o. female here for a routine follow-up of iron deficiency anemia  Patient is mildly anemic presently with a hemoglobin of 11.  Iron studies are normal with a mildly elevated iron saturation of 59% likely secondary to oral iron but ferritin remains low at 19.  She would be eligible for IV iron has received Monoferric  in the past.  We will reach out to the patient if she is willing to come back to receive another dose of IV iron at this time.  Discussed risks and benefits of IV iron including all but not limited to possible risk of infusion reaction.  CBC ferritin and iron studies in 4 and 8 months and I will see her back in 8 months   Visit Diagnosis 1. Iron deficiency anemia, unspecified iron deficiency anemia type      Dr. Annah Skene, MD, MPH [facility] [phone] 09/03/2024 5:21 PM

## 2024-09-04 ENCOUNTER — Encounter: Payer: Self-pay | Admitting: Oncology

## 2024-09-04 NOTE — Telephone Encounter (Signed)
 Patient has reviewed my chart message: Last Read in MyChart 09/04/2024 11:54 AM by [name].  Will await a response.

## 2024-09-04 NOTE — Telephone Encounter (Signed)
-----   Message from Annah JAYSON Skene sent at 09/03/2024  5:22 PM EDT ----- Iron studies are within normal limits but ferritin levels are low at 16.  It would be reasonable to offer her IV iron.  Please ask her if she wants to come for the same ----- Message ----- From: Interface, [facility] [place] Sent: 09/01/2024   2:38 PM EDT To: Annah JAYSON Skene, MD

## 2024-09-04 NOTE — Telephone Encounter (Signed)
 Per Dr. Melanee Iron studies are within normal limits but ferritin levels are low at 16. It would be reasonable to offer her IV iron. Please ask her if she wants to come for the same.  Voice message left followed with a my chart message.

## 2024-09-08 NOTE — Telephone Encounter (Signed)
 Patient has been scheduled for monoferric .

## 2024-09-12 DIAGNOSIS — J439 Emphysema, unspecified: Secondary | ICD-10-CM | POA: Diagnosis not present

## 2024-09-14 ENCOUNTER — Ambulatory Visit (INDEPENDENT_AMBULATORY_CARE_PROVIDER_SITE_OTHER)

## 2024-09-14 ENCOUNTER — Inpatient Hospital Stay

## 2024-09-14 ENCOUNTER — Other Ambulatory Visit: Payer: Self-pay | Admitting: Oncology

## 2024-09-14 VITALS — BP 132/45 | HR 57 | Temp 97.6°F | Resp 18

## 2024-09-14 DIAGNOSIS — D508 Other iron deficiency anemias: Secondary | ICD-10-CM

## 2024-09-14 DIAGNOSIS — Z23 Encounter for immunization: Secondary | ICD-10-CM | POA: Diagnosis not present

## 2024-09-14 DIAGNOSIS — D509 Iron deficiency anemia, unspecified: Secondary | ICD-10-CM | POA: Diagnosis not present

## 2024-09-14 MED ORDER — SODIUM CHLORIDE 0.9 % IV SOLN
INTRAVENOUS | Status: DC
  Filled 2024-09-14: qty 250

## 2024-09-14 MED ORDER — SODIUM CHLORIDE 0.9 % IV SOLN
1000.0000 mg | Freq: Once | INTRAVENOUS | Status: AC
  Administered 2024-09-14: 1000 mg via INTRAVENOUS
  Filled 2024-09-14: qty 1000

## 2024-09-14 NOTE — Patient Instructions (Addendum)

## 2024-09-14 NOTE — Progress Notes (Signed)
 Patient is in office today for a nurse visit for Immunization. Patient Injection was given in the  Right deltoid. Patient tolerated injection well.

## 2024-09-20 ENCOUNTER — Other Ambulatory Visit

## 2024-10-02 ENCOUNTER — Encounter: Payer: Self-pay | Admitting: Radiology

## 2024-10-23 ENCOUNTER — Encounter: Payer: Self-pay | Admitting: Acute Care

## 2024-11-07 ENCOUNTER — Telehealth: Payer: Self-pay

## 2024-11-07 NOTE — Progress Notes (Signed)
   11/07/2024  Patient ID: [name], female   DOB: May 21, 1956, 68 y.o.   MRN: [id number]  This patient is appearing on a report for being at risk of failing the adherence measure for hypertension (ACEi/ARB) medications this calendar year.   Medication: losartan 50mg  daily  Last fill date: 10/04/24 for 90 day supply  Insurance report was not up to date. No action needed at this time.   Channing DELENA Mealing, PharmD, DPLA

## 2024-11-15 ENCOUNTER — Ambulatory Visit: Admitting: Student in an Organized Health Care Education/Training Program

## 2024-11-15 ENCOUNTER — Encounter: Payer: Self-pay | Admitting: Student in an Organized Health Care Education/Training Program

## 2024-11-15 VITALS — BP 136/64 | HR 56 | Temp 97.6°F | Ht 62.0 in | Wt 271.8 lb

## 2024-11-15 DIAGNOSIS — F17210 Nicotine dependence, cigarettes, uncomplicated: Secondary | ICD-10-CM | POA: Diagnosis not present

## 2024-11-15 DIAGNOSIS — J479 Bronchiectasis, uncomplicated: Secondary | ICD-10-CM

## 2024-11-15 DIAGNOSIS — E669 Obesity, unspecified: Secondary | ICD-10-CM | POA: Diagnosis not present

## 2024-11-15 DIAGNOSIS — J439 Emphysema, unspecified: Secondary | ICD-10-CM | POA: Diagnosis not present

## 2024-11-15 DIAGNOSIS — F17200 Nicotine dependence, unspecified, uncomplicated: Secondary | ICD-10-CM

## 2024-11-15 DIAGNOSIS — R918 Other nonspecific abnormal finding of lung field: Secondary | ICD-10-CM

## 2024-11-15 DIAGNOSIS — Z6841 Body Mass Index (BMI) 40.0 and over, adult: Secondary | ICD-10-CM | POA: Diagnosis not present

## 2024-11-15 MED ORDER — ALBUTEROL SULFATE HFA 108 (90 BASE) MCG/ACT IN AERS: 2.0000 | INHALATION_SPRAY | Freq: Four times a day (QID) | RESPIRATORY_TRACT | 4 refills | Status: AC | PRN

## 2024-11-15 NOTE — Progress Notes (Unsigned)
 Synopsis: Referred in *** by Valerio Melanie DASEN, NP  Assessment & Plan:   Assessment & Plan #COPD with emphysema (GOLD 2B) #Bronchiectasis  Well-managed with current inhaler regimen. Dyspnea on exertion improving with rest. Weight gain may exacerbate symptoms. - Continue Stiolto Respimat  and Arnuity Ellipta  as prescribed. - Refilled albuterol  inhaler prescription and [facility] [place]. - Continue pulmonary clearance regimen with nebulizer, albuterol , and Flutter device twice daily.  #Pulmonary Infiltrate   Initially presented for the evaluation of a lingular infiltrate with mass like consolidation, tree-in-bud nodularity, as well as bronchial wall thickening. Bronchoscopy with BAL grew H. Influenza, and the patient received a course of Cefpodoxime  for treatment, with improvement. This was resolved on CT from July of 2024.  She has resumed her yearly low-dose CT for lung cancer screening which is showing right lower lobe patchy consolidative nodularity again suggestive of infectious process.  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  This process is sometimes associated with interstitial lung disease as well as with granulomatous inflammation though her presentation is not consistent with that.  Her findings are more in line with recurrent infectious process for which I will prescribe her another course of antibiotics with cefpodoxime .  She should be okay to continue with lung cancer screening.  Tobacco use disorder Continues to smoke approximately five cigarettes per day, reduced from previous higher usage. Insurance does not cover nicotine  patches, but she has access to over-the-counter options. - Encouraged smoking cessation using over-the-counter nicotine  patches.  Obesity  Weight gain potentially contributing to increased dyspnea. Reports eating disorder with overeating at night and consuming processed foods. -  Advised on dietary modifications, including reducing processed foods and snacks, and increasing intake of low-calorie vegetables. - Encouraged weight loss to improve respiratory symptoms.   - albuterol  (PROAIR  HFA) 108 (90 Base) MCG/ACT inhaler; Inhale 2 puffs into the lungs every 6 (six) hours as needed for wheezing or shortness of breath.  Dispense: 18 g; Refill: 4   Return in about 6 months (around 05/16/2025).  Belva November, MD [facility] 11/15/2024 2:26 PM   I spent *** minutes caring for this patient today, including {EM billing:28027}  End of visit medications:  Meds ordered this encounter  Medications   albuterol  (PROAIR  HFA) 108 (90 Base) MCG/ACT inhaler    Sig: Inhale 2 puffs into the lungs every 6 (six) hours as needed for wheezing or shortness of breath.    Dispense:  18 g    Refill:  4    Current Medications[1]   Subjective:   PATIENT ID: [name] GENDER: female DOB: [birth year], MRN: [id number]  Chief Complaint  Patient presents with   Medical Management of Chronic Issues    Shortness of breath on exertion.     HPI  Discussed the use of AI scribe software for clinical note transcription with the patient, who gave verbal consent to proceed.  History of Present Illness [name] is a 68 year old female with COPD, emphysema, and bronchiectasis who presents for follow-up.  I first met [name] in May of 2024 for the evaluation of findings noted on LDCT for lung cancer screening. She underwent bronchoscopy on 04/09/2023 with BAL which grew H. Influenza (treated with antibiotics) and improvement in symptoms.    Patient was enrolled in lung cancer screening and was note to have a left lingular infiltrate with tree in bud nodularity that have increased  over the past few scans. The two CT's from November of 2023 and February of 2024 showed a nodular and mass-like areas of consolidation in the lingula. On my review, there were  tree-in-bud opacities in that area as well. She underwent flexible bronchoscopy on 04/09/2023 with BAL growing H. Influenza. I prescribed her a course of Cefpodoxime  which she tolerated well. Repeat CT in July of 2024 showed resolution of the pulmonary infiltrates.   Return visit 02/09/2024   Symptoms are unchanged from prior. She is tolerating Stiolto well and felt improvement with it. She continues to be in her usual state of health and has an occasional cough but no wheeze or increased shortness of breath. She does report exertional dyspnea. She is limited with activity secondary to arthritis. She is requesting pre-operative evaluation and risk assessment.   Return visit 08/04/2024   She presents today for follow-up after recent low-dose CT for lung cancer screening showed infiltrates.  CT report notes waxing and waning pulmonary nodules that is resulting in repeat false positive exams.  Her symptoms are at baseline.  She continues to have cough productive of some phlegm.  She is short of breath with exertion but denies having a wheeze.  She is unable to do the full pulmonary clearance regimen that I had prescribed her.  She feels that the nebulized hypertonic saline results in significant coughing fits that lead to muscle spasm and severe back pain.  She is compliant with her inhalers.  She unfortunately continues to smoke.  While she had managed to lose a lot of weight while on Wegovy , this is no longer covered by her insurance and she has gained quite a bit of weight back.  Return Visit 11/15/2024:  She was last seen in September and treated with antibiotics for a pulmonary infiltrate, particularly in the lingula, with previous isolation of Haemophilus influenza on BAL. She experienced improvement after the antibiotic course.  She is currently on Arnuity Ellipta  and Stiolto, using two inhalers once a day, with one requiring two puffs and the other one puff. These medications have been effective,  although she still experiences shortness of breath when getting up and walking around, which improves after sitting down.  She continues with a pulmonary clearance regimen, which includes a nebulizer followed by albuterol  and a Flutter device, performed twice a day. She also uses an albuterol  inhaler, which she keeps in her purse; her current inhaler is expired.  She has been gaining weight, which she attributes to an eating disorder where she does not eat during the day but overeats at supper and snacks at night. She was previously on Wegovy  for weight management, but her insurance did not cover it. Weight gain exacerbates her shortness of breath.  She smokes approximately five cigarettes a day, down from two to two and a half packs. She has attempted to use nicotine  patches, but insurance issues have been a barrier.  She reports very seldom coughing and has received her flu shot this year.   Patient is a current smoker and has smoked for around 50 years, with at least 50 pack years of smoking history. She currently smokes 5 cigarettes a day. She used to work in a [employer]. She lives at home with her son, and has lived in her house for over 20 years. She has a crawl space, no basement, no mold, no hot tub, and no Jacuzzi. Water source is municipal. No report of previous infections.   Ancillary information including prior medications,  full medical/surgical/family/social histories, and PFTs (when available) are listed below and have been reviewed.   {PULM QUESTIONNAIRES (Optional):33196}  ROS   Objective:   Vitals:   11/15/24 1401  BP: 136/64  Pulse: (!) 56  Temp: 97.6 F (36.4 C)  TempSrc: Temporal  SpO2: 96%  Weight: 271 lb 12.8 oz (123.3 kg)  Height: 5' 2 (1.575 m)   96% on *** LPM *** RA BMI Readings from Last 3 Encounters:  11/15/24 49.71 kg/m  09/01/24 46.37 kg/m  08/22/24 46.07 kg/m   Wt Readings from Last 3 Encounters:  11/15/24 271 lb 12.8 oz (123.3 kg)   09/01/24 261 lb 11.2 oz (118.7 kg)  08/22/24 260 lb (117.9 kg)    .vitalsmbmi  Physical Exam    Ancillary Information    Past Medical History:  Diagnosis Date   Anxiety    Aortic atherosclerosis    Arthritis    Atrial fibrillation with RVR (HCC)    a.) CHA2DS2VASc = 4 (age, sex, HTN, vascular disease history);  b.) rate/rhythm maintained on oral sotolol; chronically anticoagulated with apixaban    Atypical chest pain    Benign essential tremor    Chronic kidney disease, stage 3a (HCC)    COPD (chronic obstructive pulmonary disease) (HCC)    DDD (degenerative disc disease), cervical    a.) s/p ACDF C6-C7   DDD (degenerative disc disease), lumbar    Depression    Emphysema of lung (HCC) 2019   mild   Enlarged heart    Fatty liver    GERD (gastroesophageal reflux disease)    Hiatal hernia    Hyperlipidemia    Hypertension    Insomnia    a.) takes melatonin PRN   Long term current use of anticoagulant    a.) apixaban    Mass of right adrenal gland    a.) CT AP 08/12/2022: measured 3.3 cm   Monocytosis    Morbid obesity (HCC)    Nicotine  dependence    OSA on CPAP    Osteoporosis    Pancreatitis    Peripheral vascular disease    Psoriasis    SOB (shortness of breath)    Umbilical hernia    Vitamin D  deficiency      Family History  Problem Relation Age of Onset   COPD Father    Heart disease Father    Hyperlipidemia Father    Hypertension Father    Dementia Father    Alcohol abuse Father    Cancer Sister    Hyperlipidemia Sister    Hypertension Sister    Lymphoma Sister    Migraines Son    Stroke Maternal Grandfather    Heart attack Brother    Cancer Brother        colorectal and liver   Obesity Mother    Breast cancer Paternal Aunt    Cancer Paternal Aunt    Breast cancer Paternal Aunt    Diabetes Brother      Past Surgical History:  Procedure Laterality Date   ABDOMINAL HYSTERECTOMY     ACHILLES TENDON REPAIR     Removed bone spur and  repaired achilles tendon   ANTERIOR CERVICAL DECOMP/DISCECTOMY FUSION     APPENDECTOMY     COLONOSCOPY N/A 03/14/2024   Procedure: COLONOSCOPY;  Surgeon: Therisa Bi, MD;  Location: [facility] ENDOSCOPY;  Service: Gastroenterology;  Laterality: N/A;   COLONOSCOPY WITH PROPOFOL  N/A 01/17/2019   Procedure: COLONOSCOPY WITH PROPOFOL ;  Surgeon: Therisa Bi, MD;  Location: [facility] ENDOSCOPY;  Service: Gastroenterology;  Laterality: N/A;   ESOPHAGOGASTRODUODENOSCOPY N/A 03/14/2024   Procedure: EGD (ESOPHAGOGASTRODUODENOSCOPY);  Surgeon: Therisa Bi, MD;  Location: [facility] ENDOSCOPY;  Service: Gastroenterology;  Laterality: N/A;   FLEXIBLE BRONCHOSCOPY Bilateral 04/09/2023   Procedure: FLEXIBLE BRONCHOSCOPY;  Surgeon: Isadora Hose, MD;  Location: [facility] ORS;  Service: Pulmonary;  Laterality: Bilateral;   POLYPECTOMY  03/14/2024   Procedure: POLYPECTOMY, INTESTINE;  Surgeon: Therisa Bi, MD;  Location: [facility] ENDOSCOPY;  Service: Gastroenterology;;   TONSILLECTOMY      Social History   Socioeconomic History   Marital status: Widowed    Spouse name: Not on file   Number of children: 1   Years of education: Not on file   Highest education level: GED or equivalent  Occupational History   Occupation: disabled  Tobacco Use   Smoking status: Every Day    Current packs/day: 0.50    Average packs/day: 1 pack/day for 54.0 years (53.7 ttl pk-yrs)    Types: Cigarettes    Start date: 33    Passive exposure: Past   Smokeless tobacco: Never   Tobacco comments:    Smoking 0.5-1 ppd.   Vaping Use   Vaping status: Never Used  Substance and Sexual Activity   Alcohol use: No    Alcohol/week: 0.0 standard drinks of alcohol   Drug use: No   Sexual activity: Never    Partners: Male  Other Topics Concern   Not on file  Social History Narrative   Not on file   Social Drivers of Health   Tobacco Use: High Risk (11/15/2024)   Patient History    Smoking Tobacco Use: Every Day    Smokeless Tobacco Use: Never     Passive Exposure: Past  Financial Resource Strain: Low Risk (08/22/2024)   Overall Financial Resource Strain (CARDIA)    Difficulty of Paying Living Expenses: Not hard at all  Food Insecurity: No Food Insecurity (08/22/2024)   Epic    Worried About Radiation Protection Practitioner of Food in the Last Year: Never true    Ran Out of Food in the Last Year: Never true  Transportation Needs: No Transportation Needs (08/22/2024)   Epic    Lack of Transportation (Medical): No    Lack of Transportation (Non-Medical): No  Physical Activity: Inactive (08/22/2024)   Exercise Vital Sign    Days of Exercise per Week: 0 days    Minutes of Exercise per Session: Not on file  Stress: No Stress Concern Present (08/22/2024)   [employer] of Occupational Health - Occupational Stress Questionnaire    Feeling of Stress: Only a little  Social Connections: Socially Isolated (08/22/2024)   Social Connection and Isolation Panel    Frequency of Communication with Friends and Family: More than three times a week    Frequency of Social Gatherings with Friends and Family: More than three times a week    Attends Religious Services: Never    Database Administrator or Organizations: No    Attends Banker Meetings: Not on file    Marital Status: Widowed  Intimate Partner Violence: Not At Risk (01/18/2024)   Humiliation, Afraid, Rape, and Kick questionnaire    Fear of Current or Ex-Partner: No    Emotionally Abused: No    Physically Abused: No    Sexually Abused: No  Depression (PHQ2-9): Low Risk (08/22/2024)   Depression (PHQ2-9)    PHQ-2 Score: 0  Alcohol Screen: Low Risk (01/18/2024)   Alcohol Screen    Last Alcohol Screening Score (AUDIT): 0  Housing:  Low Risk (08/22/2024)   Epic    Unable to Pay for Housing in the Last Year: No    Number of Times Moved in the Last Year: 0    Homeless in the Last Year: No  Utilities: Not At Risk (07/24/2024)   Received from [facility]   Epic    In the past 12  months has the electric, gas, oil, or water company threatened to shut off services in your home?: No  Health Literacy: Adequate Health Literacy (01/18/2024)   B1300 Health Literacy    Frequency of need for help with medical instructions: Never     Allergies[2]   CBC    Component Value Date/Time   WBC 11.2 (H) 09/01/2024 1425   WBC 10.7 (H) 03/03/2024 1334   RBC 3.58 (L) 09/01/2024 1425   HGB 11.0 (L) 09/01/2024 1425   HGB 10.4 (L) 08/22/2024 1442   HCT 35.0 (L) 09/01/2024 1425   HCT 32.7 (L) 08/22/2024 1442   PLT 228 09/01/2024 1425   PLT 201 08/22/2024 1442   MCV 97.8 09/01/2024 1425   MCV 97 08/22/2024 1442   MCV 93 03/23/2014 1851   MCH 30.7 09/01/2024 1425   MCHC 31.4 09/01/2024 1425   RDW 13.9 09/01/2024 1425   RDW 12.5 08/22/2024 1442   RDW 14.7 (H) 03/23/2014 1851   LYMPHSABS 3.7 09/01/2024 1425   LYMPHSABS 2.8 08/22/2024 1442   MONOABS 1.3 (H) 09/01/2024 1425   EOSABS 0.2 09/01/2024 1425   EOSABS 0.2 08/22/2024 1442   BASOSABS 0.1 09/01/2024 1425   BASOSABS 0.1 08/22/2024 1442    Pulmonary Functions Testing Results:    Latest Ref Rng & Units 08/24/2023    3:27 PM  PFT Results  FVC-Pre L 2.37   FVC-Predicted Pre % 79   FVC-Post L 2.31   FVC-Predicted Post % 77   Pre FEV1/FVC % % 72   Post FEV1/FCV % % 74   FEV1-Pre L 1.70   FEV1-Predicted Pre % 74   FEV1-Post L 1.70   DLCO uncorrected ml/min/mmHg 11.07   DLCO UNC% % 58   DLVA Predicted % 63   TLC L 7.90   TLC % Predicted % 161   RV % Predicted % 266     Outpatient Medications Prior to Visit  Medication Sig Dispense Refill   acetaminophen  (TYLENOL ) 500 MG tablet Take 1,000 mg by mouth in the morning and at bedtime.     albuterol  (PROVENTIL ) (2.5 MG/3ML) 0.083% nebulizer solution Take 3 mLs (2.5 mg total) by nebulization in the morning and at bedtime. 75 mL 12   apixaban  (ELIQUIS ) 5 MG TABS tablet Take 1 tablet (5 mg total) by mouth 2 (two) times daily. 60 tablet 1   ascorbic acid (VITAMIN C) 500 MG  tablet Take 500 mg by mouth daily.     atorvastatin  (LIPITOR ) 80 MG tablet Take 1 tablet (80 mg total) by mouth daily. 90 tablet 4   baclofen  (LIORESAL ) 10 MG tablet TAKE 1 TABLET BY MOUTH THREE TIMES A DAY 270 tablet 2   busPIRone  (BUSPAR ) 10 MG tablet Take 2 tablets (20 mg total) by mouth 3 (three) times daily. 540 tablet 2   Calcium  Citrate-Vitamin D  (CALCIUM  + D PO) Take 1,000 mg by mouth daily.     Cholecalciferol  (VITAMIN D ) 50 MCG (2000 UT) CAPS Take by mouth.     clonazePAM  (KLONOPIN ) 0.5 MG tablet Take 1 tablet (0.5 mg total) by mouth daily as needed for anxiety. 30  tablet 2   Ferrous Sulfate Dried (SM SLOW RELEASE IRON) 143 (45 Fe) MG TBCR Take 1 tablet by mouth daily.     fluticasone  (FLONASE ) 50 MCG/ACT nasal spray SPRAY 2 SPRAYS INTO EACH NOSTRIL EVERY DAY 48 mL 1   Fluticasone  Furoate (ARNUITY ELLIPTA ) 50 MCG/ACT AEPB Inhale 1 puff into the lungs daily. 60 each 11   gabapentin  (NEURONTIN ) 800 MG tablet Take 1 tablet (800 mg total) by mouth in the morning and at bedtime. 180 tablet 4   hydrOXYzine  (ATARAX ) 10 MG tablet Take 1 tablet (10 mg total) by mouth 3 (three) times daily as needed. 270 tablet 4   losartan (COZAAR) 50 MG tablet Take 50 mg by mouth daily.     Melatonin 10 MG TABS Take 1 tablet by mouth at bedtime.     Multiple Vitamins-Minerals (MULTIVITAMIN WITH MINERALS) tablet Take 1 tablet by mouth daily.     omeprazole  (PRILOSEC) 40 MG capsule Take 1 capsule (40 mg total) by mouth daily. 90 capsule 4   potassium chloride  SA (KLOR-CON  M20) 20 MEQ tablet Take 1 tablet (20 mEq total) by mouth daily. 90 tablet 4   sotalol (BETAPACE) 80 MG tablet Take 80 mg by mouth 2 (two) times daily.     spironolactone (ALDACTONE) 25 MG tablet Take 25 mg by mouth daily.     Tiotropium Bromide -Olodaterol (STIOLTO RESPIMAT ) 2.5-2.5 MCG/ACT AERS INHALE 2 PUFFS BY MOUTH INTO THE LUNGS DAILY 60 each PRN   torsemide (DEMADEX) 20 MG tablet Take 20 mg by mouth.  Take 1 tablet (20 mg total) by mouth 2  (two) times daily for 3 days, THEN 1 tablet (20 mg total) once daily for 90 days.     ursodiol  (URSO  FORTE) 500 MG tablet Take 1 tablet (500 mg total) by mouth 3 (three) times daily. 270 tablet 2   venlafaxine  XR (EFFEXOR -XR) 150 MG 24 hr capsule Take 1 capsule (150 mg total) by mouth daily with breakfast. 90 capsule 4   vitamin B-12 (CYANOCOBALAMIN ) 1000 MCG tablet Take 1,000 mcg by mouth daily.     albuterol  (PROAIR  HFA) 108 (90 Base) MCG/ACT inhaler Inhale 2 puffs into the lungs every 6 (six) hours as needed for wheezing or shortness of breath. 18 g 4   semaglutide -weight management (WEGOVY ) 0.25 MG/0.5ML SOAJ SQ injection Inject 0.25 mg into the skin once a week. (Patient not taking: Reported on 11/15/2024) 2 mL 5   No facility-administered medications prior to visit.      [1]  Current Outpatient Medications:    acetaminophen  (TYLENOL ) 500 MG tablet, Take 1,000 mg by mouth in the morning and at bedtime., Disp: , Rfl:    albuterol  (PROVENTIL ) (2.5 MG/3ML) 0.083% nebulizer solution, Take 3 mLs (2.5 mg total) by nebulization in the morning and at bedtime., Disp: 75 mL, Rfl: 12   apixaban  (ELIQUIS ) 5 MG TABS tablet, Take 1 tablet (5 mg total) by mouth 2 (two) times daily., Disp: 60 tablet, Rfl: 1   ascorbic acid (VITAMIN C) 500 MG tablet, Take 500 mg by mouth daily., Disp: , Rfl:    atorvastatin  (LIPITOR ) 80 MG tablet, Take 1 tablet (80 mg total) by mouth daily., Disp: 90 tablet, Rfl: 4   baclofen  (LIORESAL ) 10 MG tablet, TAKE 1 TABLET BY MOUTH THREE TIMES A DAY, Disp: 270 tablet, Rfl: 2   busPIRone  (BUSPAR ) 10 MG tablet, Take 2 tablets (20 mg total) by mouth 3 (three) times daily., Disp: 540 tablet, Rfl: 2   Calcium  Citrate-Vitamin D  (CALCIUM  +  D PO), Take 1,000 mg by mouth daily., Disp: , Rfl:    Cholecalciferol  (VITAMIN D ) 50 MCG (2000 UT) CAPS, Take by mouth., Disp: , Rfl:    clonazePAM  (KLONOPIN ) 0.5 MG tablet, Take 1 tablet (0.5 mg total) by mouth daily as needed for anxiety., Disp: 30  tablet, Rfl: 2   Ferrous Sulfate Dried (SM SLOW RELEASE IRON) 143 (45 Fe) MG TBCR, Take 1 tablet by mouth daily., Disp: , Rfl:    fluticasone  (FLONASE ) 50 MCG/ACT nasal spray, SPRAY 2 SPRAYS INTO EACH NOSTRIL EVERY DAY, Disp: 48 mL, Rfl: 1   Fluticasone  Furoate (ARNUITY ELLIPTA ) 50 MCG/ACT AEPB, Inhale 1 puff into the lungs daily., Disp: 60 each, Rfl: 11   gabapentin  (NEURONTIN ) 800 MG tablet, Take 1 tablet (800 mg total) by mouth in the morning and at bedtime., Disp: 180 tablet, Rfl: 4   hydrOXYzine  (ATARAX ) 10 MG tablet, Take 1 tablet (10 mg total) by mouth 3 (three) times daily as needed., Disp: 270 tablet, Rfl: 4   losartan (COZAAR) 50 MG tablet, Take 50 mg by mouth daily., Disp: , Rfl:    Melatonin 10 MG TABS, Take 1 tablet by mouth at bedtime., Disp: , Rfl:    Multiple Vitamins-Minerals (MULTIVITAMIN WITH MINERALS) tablet, Take 1 tablet by mouth daily., Disp: , Rfl:    omeprazole  (PRILOSEC) 40 MG capsule, Take 1 capsule (40 mg total) by mouth daily., Disp: 90 capsule, Rfl: 4   potassium chloride  SA (KLOR-CON  M20) 20 MEQ tablet, Take 1 tablet (20 mEq total) by mouth daily., Disp: 90 tablet, Rfl: 4   sotalol (BETAPACE) 80 MG tablet, Take 80 mg by mouth 2 (two) times daily., Disp: , Rfl:    spironolactone (ALDACTONE) 25 MG tablet, Take 25 mg by mouth daily., Disp: , Rfl:    Tiotropium Bromide -Olodaterol (STIOLTO RESPIMAT ) 2.5-2.5 MCG/ACT AERS, INHALE 2 PUFFS BY MOUTH INTO THE LUNGS DAILY, Disp: 60 each, Rfl: PRN   torsemide (DEMADEX) 20 MG tablet, Take 20 mg by mouth.  Take 1 tablet (20 mg total) by mouth 2 (two) times daily for 3 days, THEN 1 tablet (20 mg total) once daily for 90 days., Disp: , Rfl:    ursodiol  (URSO  FORTE) 500 MG tablet, Take 1 tablet (500 mg total) by mouth 3 (three) times daily., Disp: 270 tablet, Rfl: 2   venlafaxine  XR (EFFEXOR -XR) 150 MG 24 hr capsule, Take 1 capsule (150 mg total) by mouth daily with breakfast., Disp: 90 capsule, Rfl: 4   vitamin B-12 (CYANOCOBALAMIN )  1000 MCG tablet, Take 1,000 mcg by mouth daily., Disp: , Rfl:    albuterol  (PROAIR  HFA) 108 (90 Base) MCG/ACT inhaler, Inhale 2 puffs into the lungs every 6 (six) hours as needed for wheezing or shortness of breath., Disp: 18 g, Rfl: 4   semaglutide -weight management (WEGOVY ) 0.25 MG/0.5ML SOAJ SQ injection, Inject 0.25 mg into the skin once a week. (Patient not taking: Reported on 11/15/2024), Disp: 2 mL, Rfl: 5 [2]  Allergies Allergen Reactions   Iodinated Contrast Media Rash    Had rash present to neck with PET scan contrast

## 2024-11-15 NOTE — Patient Instructions (Signed)
°  VISIT SUMMARY: Today, you came in for a follow-up visit to discuss your COPD, emphysema, and bronchiectasis. We reviewed your current treatment plan, discussed your symptoms, and addressed your concerns about weight gain and smoking.  YOUR PLAN: -COPD WITH EMPHYSEMA AND BRONCHIECTASIS: COPD, emphysema, and bronchiectasis are chronic lung conditions that cause breathing difficulties. Your current inhaler regimen is working well, but you still experience shortness of breath when walking, which improves with rest. Continue using Stiolto Respimat  and Arnuity Ellipta  as prescribed. Your albuterol  inhaler prescription has been [facility] [place]. Keep up with your pulmonary clearance regimen using the nebulizer, albuterol , and Flutter device twice daily.  -TOBACCO USE DISORDER: Tobacco use disorder means you are dependent on smoking. You have reduced your smoking to five cigarettes a day, which is an improvement. Since insurance does not cover nicotine  patches, consider using over-the-counter options to help you quit smoking.  -OBESITY: Obesity can worsen your breathing problems. You mentioned an eating disorder where you overeat at night and consume processed foods. Try to reduce processed foods and snacks, and increase your intake of low-calorie vegetables. Losing weight can help improve your respiratory symptoms.  INSTRUCTIONS: Please continue with your current medications and pulmonary clearance regimen. Your [facility]erol  inhal[facility] [place]. Consider using over-the-counter nicotine  patches to help quit smoking. Make dietary changes to reduce processed foods and increase low-calorie vegetables to aid in weight loss. Follow up with us  as needed.      Contains text generated by [employer].

## 2024-11-21 ENCOUNTER — Other Ambulatory Visit: Payer: Self-pay | Admitting: Nurse Practitioner

## 2024-11-25 NOTE — Patient Instructions (Signed)
 Be Involved in Caring For Your Health:  Taking Medications When medications are taken as directed, they can greatly improve your health. But if they are not taken as prescribed, they may not work. In some cases, not taking them correctly can be harmful. To help ensure your treatment remains effective and safe, understand your medications and how to take them. Bring your medications to each visit for review by your provider.  Your lab results, notes, and after visit summary will be available on My Chart. We strongly encourage you to use this feature. If lab results are abnormal the clinic will contact you with the appropriate steps. If the clinic does not contact you assume the results are satisfactory. You can always view your results on My Chart. If you have questions regarding your health or results, please contact the clinic during office hours. You can also ask questions on My Chart.  We at [facility] are grateful that you chose us  to provide your care. We strive to provide evidence-based and compassionate care and are always looking for feedback. If you get a survey from the clinic please complete this so we can hear your opinions.  DASH Eating Plan DASH stands for Dietary Approaches to Stop Hypertension. The DASH eating plan is a healthy eating plan that has been shown to: Lower high blood pressure (hypertension). Reduce your risk for type 2 diabetes, heart disease, and stroke. Help with weight loss. What are tips for following this plan? Reading food labels Check food labels for the amount of salt (sodium) per serving. Choose foods with less than 5 percent of the Daily Value (DV) of sodium. In general, foods with less than 300 milligrams (mg) of sodium per serving fit into this eating plan. To find whole grains, look for the word whole as the first word in the ingredient list. Shopping Buy products labeled as low-sodium or no salt added. Buy fresh foods. Avoid canned  foods and pre-made or frozen meals. Cooking Try not to add salt when you cook. Use salt-free seasonings or herbs instead of table salt or sea salt. Check with your health care provider or pharmacist before using salt substitutes. Do not fry foods. Cook foods in healthy ways, such as baking, boiling, grilling, roasting, or broiling. Cook using oils that are good for your heart. These include olive, canola, avocado, soybean, and sunflower oil. Meal planning  Eat a balanced diet. This should include: 4 or more servings of fruits and 4 or more servings of vegetables each day. Try to fill half of your plate with fruits and vegetables. 6-8 servings of whole grains each day. 6 or less servings of lean meat, poultry, or fish each day. 1 oz is 1 serving. A 3 oz (85 g) serving of meat is about the same size as the palm of your hand. One egg is 1 oz (28 g). 2-3 servings of low-fat dairy each day. One serving is 1 cup (237 mL). 1 serving of nuts, seeds, or beans 5 times each week. 2-3 servings of heart-healthy fats. Healthy fats called omega-3 fatty acids are found in foods such as walnuts, flaxseeds, fortified milks, and eggs. These fats are also found in cold-water fish, such as sardines, salmon, and mackerel. Limit how much you eat of: Canned or prepackaged foods. Food that is high in trans fat, such as fried foods. Food that is high in saturated fat, such as fatty meat. Desserts and other sweets, sugary drinks, and other foods with added sugar. Full-fat  dairy products. Do not salt foods before eating. Do not eat more than 4 egg yolks a week. Try to eat at least 2 vegetarian meals a week. Eat more home-cooked food and less restaurant, buffet, and fast food. Lifestyle When eating at a restaurant, ask if your food can be made with less salt or no salt. If you drink alcohol: Limit how much you have to: 0-1 drink a day if you are female. 0-2 drinks a day if you are female. Know how much alcohol is in  your drink. In the [place] one drink is one 12 oz bottle of beer (355 mL), one 5 oz glass of wine (148 mL), or one 1 oz glass of hard liquor (44 mL). General information Avoid eating more than 2,300 mg of salt a day. If you have hypertension, you may need to reduce your sodium intake to 1,500 mg a day. Work with your provider to stay at a healthy body weight or lose weight. Ask what the best weight range is for you. On most days of the week, get at least 30 minutes of exercise that causes your heart to beat faster. This may include walking, swimming, or biking. Work with your provider or dietitian to adjust your eating plan to meet your specific calorie needs. What foods should I eat? Fruits All fresh, dried, or frozen fruit. Canned fruits that are in their natural juice and do not have sugar added to them. Vegetables Fresh or frozen vegetables that are raw, steamed, roasted, or grilled. Low-sodium or reduced-sodium tomato and vegetable juice. Low-sodium or reduced-sodium tomato sauce and tomato paste. Low-sodium or reduced-sodium canned vegetables. Grains Whole-grain or whole-wheat bread. Whole-grain or whole-wheat pasta. Brown rice. [place] Madeira. Bulgur. Whole-grain and low-sodium cereals. Pita bread. Low-fat, low-sodium crackers. Whole-wheat flour tortillas. Meats and other proteins Skinless chicken or [place]. Ground chicken or [place]. Pork with fat trimmed off. Fish and seafood. Egg whites. Dried beans, peas, or lentils. Unsalted nuts, nut butters, and seeds. Unsalted canned beans. Lean cuts of beef with fat trimmed off. Low-sodium, lean precooked or cured meat, such as sausages or meat loaves. Dairy Low-fat (1%) or fat-free (skim) milk. Reduced-fat, low-fat, or fat-free cheeses. Nonfat, low-sodium ricotta or cottage cheese. Low-fat or nonfat yogurt. Low-fat, low-sodium cheese. Fats and oils Soft margarine without trans fats. Vegetable oil. Reduced-fat, low-fat, or light mayonnaise and salad  dressings (reduced-sodium). Canola, safflower, olive, avocado, soybean, and sunflower oils. Avocado. Seasonings and condiments Herbs. Spices. Seasoning mixes without salt. Other foods Unsalted popcorn and pretzels. Fat-free sweets. The items listed above may not be all the foods and drinks you can have. Talk to a dietitian to learn more. What foods should I avoid? Fruits Canned fruit in a light or heavy syrup. Fried fruit. Fruit in cream or butter sauce. Vegetables Creamed or fried vegetables. Vegetables in a cheese sauce. Regular canned vegetables that are not marked as low-sodium or reduced-sodium. Regular canned tomato sauce and paste that are not marked as low-sodium or reduced-sodium. Regular tomato and vegetable juices that are not marked as low-sodium or reduced-sodium. Dene. Olives. Grains Baked goods made with fat, such as croissants, muffins, or some breads. Dry pasta or rice meal packs. Meats and other proteins Fatty cuts of meat. Ribs. Fried meat. [place]. Bologna, salami, and other precooked or cured meats, such as sausages or meat loaves, that are not lean and low in sodium. Fat from the back of a pig (fatback). Bratwurst. Salted nuts and seeds. Canned beans with added salt. Canned  or smoked fish. Whole eggs or egg yolks. Chicken or [place] with skin. Dairy Whole or 2% milk, cream, and half-and-half. Whole or full-fat cream cheese. Whole-fat or sweetened yogurt. Full-fat cheese. Nondairy creamers. Whipped toppings. Processed cheese and cheese spreads. Fats and oils Butter. Stick margarine. Lard. Shortening. Ghee. Bacon fat. Tropical oils, such as coconut, palm kernel, or palm oil. Seasonings and condiments Onion salt, garlic salt, seasoned salt, table salt, and sea salt. Worcestershire sauce. Tartar sauce. Barbecue sauce. Teriyaki sauce. Soy sauce, including reduced-sodium soy sauce. Steak sauce. Canned and packaged gravies. Fish sauce. Oyster sauce. Cocktail sauce. Store-bought  horseradish. Ketchup. Mustard. Meat flavorings and tenderizers. Bouillon cubes. Hot sauces. Pre-made or packaged marinades. Pre-made or packaged taco seasonings. Relishes. Regular salad dressings. Other foods Salted popcorn and pretzels. The items listed above may not be all the foods and drinks you should avoid. Talk to a dietitian to learn more. Where to find more information National Heart, Lung, and Blood Institute (NHLBI): BuffaloDryCleaner.gl American Heart Association (AHA): heart.org Academy of Nutrition and Dietetics: eatright.org National Kidney Foundation (NKF): kidney.org This information is not intended to replace advice given to you by your health care provider. Make sure you discuss any questions you have with your health care provider. Document Revised: 12/03/2022 Document Reviewed: 12/03/2022 Elsevier Patient Education  2024 [employer].

## 2024-11-27 ENCOUNTER — Ambulatory Visit: Admitting: Nurse Practitioner

## 2024-11-27 ENCOUNTER — Encounter: Payer: Self-pay | Admitting: Nurse Practitioner

## 2024-11-27 VITALS — BP 130/68 | HR 64 | Temp 99.2°F | Resp 20 | Ht 62.01 in | Wt 269.2 lb

## 2024-11-27 DIAGNOSIS — I4891 Unspecified atrial fibrillation: Secondary | ICD-10-CM | POA: Diagnosis not present

## 2024-11-27 DIAGNOSIS — R7303 Prediabetes: Secondary | ICD-10-CM | POA: Diagnosis not present

## 2024-11-27 DIAGNOSIS — I10 Essential (primary) hypertension: Secondary | ICD-10-CM | POA: Diagnosis not present

## 2024-11-27 DIAGNOSIS — J432 Centrilobular emphysema: Secondary | ICD-10-CM | POA: Diagnosis not present

## 2024-11-27 DIAGNOSIS — K743 Primary biliary cirrhosis: Secondary | ICD-10-CM

## 2024-11-27 DIAGNOSIS — F17210 Nicotine dependence, cigarettes, uncomplicated: Secondary | ICD-10-CM

## 2024-11-27 DIAGNOSIS — N1831 Chronic kidney disease, stage 3a: Secondary | ICD-10-CM | POA: Diagnosis not present

## 2024-11-27 DIAGNOSIS — B354 Tinea corporis: Secondary | ICD-10-CM | POA: Insufficient documentation

## 2024-11-27 DIAGNOSIS — F324 Major depressive disorder, single episode, in partial remission: Secondary | ICD-10-CM

## 2024-11-27 DIAGNOSIS — G4733 Obstructive sleep apnea (adult) (pediatric): Secondary | ICD-10-CM | POA: Diagnosis not present

## 2024-11-27 DIAGNOSIS — F419 Anxiety disorder, unspecified: Secondary | ICD-10-CM

## 2024-11-27 DIAGNOSIS — E782 Mixed hyperlipidemia: Secondary | ICD-10-CM

## 2024-11-27 DIAGNOSIS — Z79899 Other long term (current) drug therapy: Secondary | ICD-10-CM

## 2024-11-27 LAB — BAYER DCA HB A1C WAIVED: HB A1C (BAYER DCA - WAIVED): 5.6 % (ref 4.8–5.6)

## 2024-11-27 MED ORDER — NYSTATIN 100000 UNIT/GM EX POWD: 1.0000 | Freq: Three times a day (TID) | CUTANEOUS | 4 refills | Status: DC

## 2024-11-27 MED ORDER — NYSTATIN 100000 UNIT/GM EX POWD: 1.0000 | Freq: Three times a day (TID) | CUTANEOUS | 4 refills | Status: AC

## 2024-11-27 MED ORDER — FLUCONAZOLE 150 MG PO TABS: ORAL_TABLET | ORAL | 0 refills | Status: DC

## 2024-11-27 NOTE — Assessment & Plan Note (Signed)
 Chronic, ongoing. Followed by GI, continue this collaboration. Recent noted reviewed. Hep B vaccine #1 in office today.

## 2024-11-27 NOTE — Assessment & Plan Note (Signed)
 BMI 49.22 with OSA, HTN and COPD, A-Fib.  Recommended eating smaller high protein, low fat meals more frequently and exercising 30 mins a day 5 times a week with a goal of 10-15lb weight loss in the next 3 months. Patient voiced their understanding and motivation to adhere to these recommendations.  Tolerated Ozempic  with total 68 pounds loss, but insurance stopped covering and she is now gaining weight back. Recently tried to get GLP1 coverage with her OSA, but was denied.  Could consider oral GLP1 once out on market in new year.  Discussed with her today.

## 2024-11-27 NOTE — Assessment & Plan Note (Signed)
I have recommended complete cessation of tobacco use. I have discussed various options available for assistance with tobacco cessation including over the counter methods (Nicotine gum, patch and lozenges). We also discussed prescription options (Chantix, Nicotine Inhaler / Nasal Spray). The patient is not interested in pursuing any prescription tobacco cessation options at this time.  Continue annual lung screening. 

## 2024-11-27 NOTE — Assessment & Plan Note (Signed)
Chronic, stable.  Diagnosed 04/14/22 in hospital.  At this time HR stable and no symptoms.  Continue collaboration with cardiology and current medication regimen, adjust as needed.

## 2024-11-27 NOTE — Assessment & Plan Note (Signed)
 Chronic, stable at this time. Denies SI/HI. Continue Effexor  XL 150 MG daily + Buspar  and Vistaril .  Continue at lowest dosing, Klonopin  0.5 MG daily as needed only -- take only as needed.  Aware of need for every 3 month visit, yearly drug screen, and controlled substance agreement.  Controlled substance agreement up to date and UDS due 05/22/25.  Consider Prozac , as worked well in past, but this could interact with cardiac medications (QT prolongation) - will not initiate this at this time.  Consider psychiatry referral in future.  Monitor BP with Effexor .

## 2024-11-27 NOTE — Assessment & Plan Note (Signed)
 Chronic, ongoing.  Continue current medication regimen and adjust as needed.  Not O2 dependent at this time.  Continue to recommend complete smoking cessation. Continue collaboration with pulmonary, appreciate their input.  Recent note reviewed. Would benefit restarting Semaglutide  due to benefit with OSA, is FDA approved for this + her BMI.  Her health, breathing, and mobility overall improved with this on board in past. However, on recent attempt to get coverage they continue to deny.  Could consider attempt to get oral GLP1 in new year once on market. Discussed with there today. Recently FDA approved and appears will be lower cost.

## 2024-11-27 NOTE — Assessment & Plan Note (Signed)
 Under pannus and breasts. Educated her on this. Start with Nystatin powder and if no benefit could send in oral Diflucan, but would need to hold Sotalol and Atorvastatin  for 24 to 48 hours after dose to avoid interactions. Educated her on care of this, ensuring skin stays dry at all times. If worsening to alert PCP.

## 2024-11-27 NOTE — Assessment & Plan Note (Signed)
 Ongoing with A1c ranging from 5.7 to 5.9%, today 5.6% without Semaglutide  on board.  Insurance no longer covering Ozempic  which offered her a lot of benefit.  Would benefit restarting Semaglutide  due to benefit with OSA, is FDA approved for this + her BMI.  Her health and mobility overall improved with this on board in past. They have continued to deny coverage, will see if they will cover oral version once released in new year.

## 2024-11-27 NOTE — Assessment & Plan Note (Signed)
 Chronic, ongoing -- continue 100% use of this.  Would benefit restarting Semaglutide  due to benefit with OSA in past, could consider oral version once available in new year. GLP1 injections have not been approved by her insurance.

## 2024-11-27 NOTE — Assessment & Plan Note (Signed)
 Chronic, stable.  BP at goal today.  Continue current medication regimen and adjust as needed -- is taking BB and Lasix  only at this time.  Previously took Telmisartan  and HCTZ.  Recommend she monitor BP at home daily and document for provider + focus on DASH diet.  LABS: CMP.  Urine ALB 29 December 2022, may benefit from return to low dose ARB in future.

## 2024-11-27 NOTE — Progress Notes (Signed)
 "  BP 130/68 (BP Location: Left Arm, Patient Position: Sitting, Cuff Size: Large)   Pulse 64   Temp 99.2 F (37.3 C) (Oral)   Resp 20   Ht 5' 2.01 (1.575 m)   Wt 269 lb 3.2 oz (122.1 kg)   LMP  (LMP Unknown)   SpO2 94%   BMI 49.22 kg/m    Subjective:    Patient ID: [name], female    DOB: 1956-09-13, 68 y.o.   MRN: [id number]  HPI: ALIVEAH GALLANT is a 68 y.o. female  Chief Complaint  Patient presents with   Follow-up    Here for follow up. Has a rash up under her stomach, it was up under her breast but she said that it is almost cleared up now. Has been putting a barrier cream on the rash.   Has yeast rash to under breasts and pannus area.  Been present for one week and is painful.    ATRIAL FIBRILLATION Continues Eliquis , Sotalol, + Atorvastatin . Last cardiology visit was on 07/17/24 when medication changes were made, Lasix  stopped and Torsemide started. BP at home has been less than 130/80 at baseline.  History: Stopped Ozempic  several months back as insurance stopped covering (has been off for >11 months). Had been on for one year with drastic weight loss and improvement in quality of life + chronic diseases with this on board. Started 12/10/21 at 256 lbs and then went down to 188 lbs = total 68 pounds loss. She had met her goal. Now she is gaining back without it on board and chronic health issues are worsening. Atrial fibrillation status: stable Satisfied with current treatment: yes  Medication side effects:  no Medication compliance: good compliance Etiology of atrial fibrillation: unknown Palpitations:  no Chest pain:  no Dyspnea on exertion: at baseline Orthopnea:  no Syncope:  no Edema: at baseline Ventricular rate control: Sotalol Anti-coagulation: long acting   Impaired Fasting Glucose HbA1C:  Lab Results  Component Value Date   HGBA1C 5.6 11/27/2024  Duration of elevated blood sugar: years Polydipsia: no Polyuria: no Weight change: no Visual  disturbance: no Glucose Monitoring: no    Accucheck frequency: Not Checking    Fasting glucose:     Post prandial:  Diabetic Education: Not Completed Family history of diabetes: yes   COPD Aortic atherosclerosis and centrilobular emphysema noted on lung screening yearly. Saw pulmonary last on 11/15/24 with no changes. Last exacerbation treated with Vantin  in September 2025. Continues to smoke 1 pack, but has cut back to this every 3-4 days. Her family and her are going to work on quitting in new year. Using CPAP 100% of the time. COPD status: stable Satisfied with current treatment?: yes Oxygen use: no Dyspnea frequency: at baseline with exertion Cough frequency: no  Rescue inhaler frequency: none Limitation of activity: no Productive cough: none Last Spirometry: unknown Pneumovax: Up to Date Influenza: Up to Date  CHRONIC KIDNEY DISEASE (CKD 3a) Currently does not see nephrology. Follows with Dr. Therisa for primary biliary cholangitis, with last visit on 07/24/24 - returns to see him tomorrow.  Has requested she obtain Hep B vaccines. She is to continue on Ursodiol .  Is following with hematology, with last visit on 09/01/24. CKD status: stable Medications renally dose: yes Previous renal evaluation: no Pneumovax:  Up to Date Influenza Vaccine:  Up to Date   DEPRESSION Taking Effexor  XR 150 MG daily which has offered benefit, took Duloxetine  in past but Effexor  offers more benefit.  Continues on Buspar  20 MG TID + Clonazepam  0.5 MG daily PRN.  Use Klonopin  once a day as keeps panic away. Is not seeing pain clinic any longer due to taking benzo, decided she would prefer benzo over opioid.  Pt is aware of risks of psychoactive medication use to include increased sedation, respiratory suppression, falls, dependence and cardiovascular events.  Pt would like to continue treatment as benefit determined to outweigh risk.  PDMP last fill 11/22/24. Mood status: stable Satisfied with current  treatment?: yes Symptom severity: moderate  Duration of current treatment : chronic Side effects: no Medication compliance: good compliance Psychotherapy/counseling: none Depressed mood: no Anxious mood: at night time Anhedonia: no Significant weight loss or gain: no Insomnia: yes, falling asleep, gets panicked, takes Klonopin   Fatigue: no Feelings of worthlessness or guilt: no Impaired concentration/indecisiveness: no Suicidal ideations: no Hopelessness: no Crying spells: yes    11/27/2024    2:37 PM 08/22/2024    2:15 PM 05/22/2024    2:47 PM 02/15/2024    2:26 PM 01/18/2024    2:05 PM  Depression screen PHQ 2/9  Decreased Interest 0 0 0 0 0  Down, Depressed, Hopeless 0 0 0 0 0  PHQ - 2 Score 0 0 0 0 0  Altered sleeping 0 0 0 0   Tired, decreased energy 0 0 0 0   Change in appetite 0 0 0 0   Feeling bad or failure about yourself  0 0 0 0   Trouble concentrating 0 0 0 0   Moving slowly or fidgety/restless 0 0 0 0   Suicidal thoughts 0 0 0 0   PHQ-9 Score 0 0  0  0    Difficult doing work/chores   Not difficult at all Not difficult at all      Data saved with a previous flowsheet row definition       11/27/2024    2:40 PM 08/22/2024    2:15 PM 05/22/2024    2:47 PM 02/15/2024    2:26 PM  GAD 7 : Generalized Anxiety Score  Nervous, Anxious, on Edge 2 0 3 3  Control/stop worrying 0 0 0 0  Worry too much - different things 0 0 0 0  Trouble relaxing 0 0 0 0  Restless 0 0 0 0  Easily annoyed or irritable 0 0 0 0  Afraid - awful might happen 0 0 0 0  Total GAD 7 Score 2 0 3 3  Anxiety Difficulty   Not difficult at all Not difficult at all   Relevant past medical, surgical, family and social history reviewed and updated as indicated. Interim medical history since our last visit reviewed. Allergies and medications reviewed and updated.  Review of Systems  Constitutional:  Negative for activity change, appetite change, diaphoresis, fatigue and fever.  HENT:  Negative for  ear discharge and ear pain.   Respiratory:  Negative for cough, chest tightness, shortness of breath and wheezing.   Cardiovascular:  Negative for chest pain, palpitations and leg swelling.  Gastrointestinal: Negative.   Musculoskeletal:  Positive for arthralgias and back pain.  Skin:  Positive for rash.  Neurological: Negative.   Psychiatric/Behavioral:  Negative for decreased concentration, self-injury, sleep disturbance and suicidal ideas. The patient is nervous/anxious.    Per HPI unless specifically indicated above     Objective:    BP 130/68 (BP Location: Left Arm, Patient Position: Sitting, Cuff Size: Large)   Pulse 64   Temp 99.2 F (37.3 C) (  Oral)   Resp 20   Ht 5' 2.01 (1.575 m)   Wt 269 lb 3.2 oz (122.1 kg)   LMP  (LMP Unknown)   SpO2 94%   BMI 49.22 kg/m   Wt Readings from Last 3 Encounters:  11/27/24 269 lb 3.2 oz (122.1 kg)  11/15/24 271 lb 12.8 oz (123.3 kg)  09/01/24 261 lb 11.2 oz (118.7 kg)    Physical Exam Vitals and nursing note reviewed.  Constitutional:      General: She is awake. She is not in acute distress.    Appearance: She is well-developed and well-groomed. She is obese. She is not ill-appearing or toxic-appearing.  HENT:     Head: Normocephalic.     Right Ear: Hearing and external ear normal.     Left Ear: Hearing and external ear normal.  Eyes:     General: Lids are normal.        Right eye: No discharge.        Left eye: No discharge.     Conjunctiva/sclera: Conjunctivae normal.     Pupils: Pupils are equal, round, and reactive to light.  Neck:     Thyroid : No thyromegaly.     Vascular: No carotid bruit.  Cardiovascular:     Rate and Rhythm: Normal rate and regular rhythm.     Heart sounds: Normal heart sounds. No murmur heard.    No gallop.  Pulmonary:     Effort: Pulmonary effort is normal. No accessory muscle usage or respiratory distress.     Breath sounds: Normal breath sounds. No decreased breath sounds, wheezing or rales.   Abdominal:     General: Bowel sounds are normal. There is no distension.     Palpations: Abdomen is soft.     Tenderness: There is no abdominal tenderness.  Musculoskeletal:     Cervical back: Normal range of motion and neck supple.     Right lower leg: 1+ Pitting Edema present.     Left lower leg: 1+ Pitting Edema present.  Lymphadenopathy:     Cervical: No cervical adenopathy.  Skin:    General: Skin is warm and dry.     Findings: Rash present.     Comments: Under pannus area, moderate erythema and some small areas of skin breakdown present. Under breasts moderate erythema. Moist.  Neurological:     Mental Status: She is alert and oriented to person, place, and time.     Deep Tendon Reflexes: Reflexes are normal and symmetric.     Reflex Scores:      Brachioradialis reflexes are 2+ on the right side and 2+ on the left side.      Patellar reflexes are 2+ on the right side and 2+ on the left side. Psychiatric:        Attention and Perception: Attention normal.        Mood and Affect: Mood normal.        Speech: Speech normal.        Behavior: Behavior normal. Behavior is cooperative.        Thought Content: Thought content normal.    Results for orders placed or performed in visit on 11/27/24  Bayer DCA Hb A1c Waived   Collection Time: 11/27/24  2:47 PM  Result Value Ref Range   HB A1C (BAYER DCA - WAIVED) 5.6 4.8 - 5.6 %      Assessment & Plan:   Problem List Items Addressed This Visit  Cardiovascular and Mediastinum   Benign hypertension   Chronic, stable.  BP at goal today.  Continue current medication regimen and adjust as needed -- is taking BB and Lasix  only at this time.  Previously took Telmisartan  and HCTZ.  Recommend she monitor BP at home daily and document for provider + focus on DASH diet.  LABS: CMP.  Urine ALB 29 December 2022, may benefit from return to low dose ARB in future.        Atrial fibrillation with RVR (HCC)   Chronic, stable.  Diagnosed  04/14/22 in hospital.  At this time HR stable and no symptoms.  Continue collaboration with cardiology and current medication regimen, adjust as needed.          Respiratory   Sleep apnea   Chronic, ongoing -- continue 100% use of this.  Would benefit restarting Semaglutide  due to benefit with OSA in past, could consider oral version once available in new year. GLP1 injections have not been approved by her insurance.      Centrilobular emphysema (HCC) - Primary   Chronic, ongoing.  Continue current medication regimen and adjust as needed.  Not O2 dependent at this time.  Continue to recommend complete smoking cessation. Continue collaboration with pulmonary, appreciate their input.  Recent note reviewed. Would benefit restarting Semaglutide  due to benefit with OSA, is FDA approved for this + her BMI.  Her health, breathing, and mobility overall improved with this on board in past. However, on recent attempt to get coverage they continue to deny.  Could consider attempt to get oral GLP1 in new year once on market. Discussed with there today. Recently FDA approved and appears will be lower cost.        Digestive   Primary biliary cholangitis (HCC)   Chronic, ongoing. Followed by GI, continue this collaboration. Recent noted reviewed. Hep B vaccine #1 in office today.      Relevant Orders   Heplisav-B (HepB-CPG) Vaccine (Completed)     Musculoskeletal and Integument   Tinea corporis   Under pannus and breasts. Educated her on this. Start with Nystatin powder and if no benefit could send in oral Diflucan, but would need to hold Sotalol and Atorvastatin  for 24 to 48 hours after dose to avoid interactions. Educated her on care of this, ensuring skin stays dry at all times. If worsening to alert PCP.      Relevant Medications   nystatin (MYCOSTATIN/NYSTOP) powder     Genitourinary   Chronic kidney disease, stage 3a (HCC)   Ongoing.  Reminded to avoid NSAIDs.  May need to adjust Gabapentin   based on labs, will review and determine need to reduce -- discussed with patient.  Labs today: CMP.  Increase fluid intake at home.        Other   Prediabetes (Chronic)   Ongoing with A1c ranging from 5.7 to 5.9%, today 5.6% without Semaglutide  on board.  Insurance no longer covering Ozempic  which offered her a lot of benefit.  Would benefit restarting Semaglutide  due to benefit with OSA, is FDA approved for this + her BMI.  Her health and mobility overall improved with this on board in past. They have continued to deny coverage, will see if they will cover oral version once released in new year.      Relevant Orders   Bayer DCA Hb A1c Waived (Completed)   Nicotine  dependence, cigarettes, uncomplicated   I have recommended complete cessation of tobacco use. I have discussed various options available  for assistance with tobacco cessation including over the counter methods (Nicotine  gum, patch and lozenges). We also discussed prescription options (Chantix, Nicotine  Inhaler / Nasal Spray). The patient is not interested in pursuing any prescription tobacco cessation options at this time.  Continue annual lung screening.       Morbid obesity (HCC)   BMI 49.22 with OSA, HTN and COPD, A-Fib.  Recommended eating smaller high protein, low fat meals more frequently and exercising 30 mins a day 5 times a week with a goal of 10-15lb weight loss in the next 3 months. Patient voiced their understanding and motivation to adhere to these recommendations.  Tolerated Ozempic  with total 68 pounds loss, but insurance stopped covering and she is now gaining weight back. Recently tried to get GLP1 coverage with her OSA, but was denied.  Could consider oral GLP1 once out on market in new year.  Discussed with her today.       Hyperlipidemia   Chronic, ongoing.  Continue statin and adjust as needed -- Atorvastatin  80 MG.  Lipid panel today.      Relevant Orders   Comprehensive metabolic panel with GFR   Lipid  Panel w/o Chol/HDL Ratio   Depression, major, single episode, in partial remission   Chronic, stable at this time. Denies SI/HI. Continue Effexor  XL 150 MG daily + Buspar  and Vistaril .  Continue at lowest dosing, Klonopin  0.5 MG daily as needed only -- take only as needed.  Aware of need for every 3 month visit, yearly drug screen, and controlled substance agreement.  Controlled substance agreement up to date and UDS due 05/22/25.  Consider Prozac , as worked well in past, but this could interact with cardiac medications (QT prolongation) - will not initiate this at this time.  Consider psychiatry referral in future.  Monitor BP with Effexor .         Follow up plan: Return in about 3 months (around 02/25/2025) for HTN/HLD, A-FIB, CKD, COPD, IFG, MOOD.      "

## 2024-11-27 NOTE — Assessment & Plan Note (Signed)
Chronic, ongoing.  Continue statin and adjust as needed -- Atorvastatin 80 MG.  Lipid panel today. 

## 2024-11-27 NOTE — Assessment & Plan Note (Signed)
 Ongoing.  Reminded to avoid NSAIDs.  May need to adjust Gabapentin  based on labs, will review and determine need to reduce -- discussed with patient.  Labs today: CMP.  Increase fluid intake at home.

## 2024-11-28 ENCOUNTER — Ambulatory Visit: Payer: Self-pay | Admitting: Nurse Practitioner

## 2024-11-28 LAB — LIPID PANEL W/O CHOL/HDL RATIO
Cholesterol, Total: 133 mg/dL (ref 100–199)
HDL: 45 mg/dL
LDL Chol Calc (NIH): 65 mg/dL (ref 0–99)
Triglycerides: 131 mg/dL (ref 0–149)
VLDL Cholesterol Cal: 23 mg/dL (ref 5–40)

## 2024-11-28 LAB — COMPREHENSIVE METABOLIC PANEL WITH GFR
ALT: 11 IU/L (ref 0–32)
AST: 19 IU/L (ref 0–40)
Albumin: 3.7 g/dL — ABNORMAL LOW (ref 3.9–4.9)
Alkaline Phosphatase: 72 IU/L (ref 49–135)
BUN/Creatinine Ratio: 18 (ref 12–28)
BUN: 21 mg/dL (ref 8–27)
Bilirubin Total: 0.2 mg/dL (ref 0.0–1.2)
CO2: 21 mmol/L (ref 20–29)
Calcium: 9.3 mg/dL (ref 8.7–10.3)
Chloride: 105 mmol/L (ref 96–106)
Creatinine, Ser: 1.15 mg/dL — ABNORMAL HIGH (ref 0.57–1.00)
Globulin, Total: 2.9 g/dL (ref 1.5–4.5)
Glucose: 98 mg/dL (ref 70–99)
Potassium: 4.6 mmol/L (ref 3.5–5.2)
Sodium: 142 mmol/L (ref 134–144)
Total Protein: 6.6 g/dL (ref 6.0–8.5)
eGFR: 52 mL/min/1.73 — ABNORMAL LOW

## 2024-11-28 NOTE — Progress Notes (Signed)
 Contacted via MyChart  Good afternoon [name], your labs have returned: - Kidney function, creatinine and eGFR, continues to show Stage 3a kidney disease which is stable. Continue to ensure good water intake at home and avoid Ibuprofen products. Liver function, AST and ALT, normal. - Lipid panel remains stable. No changes needed. Any questions? Keep being amazing!!  Thank you for allowing me to participate in your care.  I appreciate you. Kindest regards, Jeremia Groot

## 2024-12-01 ENCOUNTER — Other Ambulatory Visit: Payer: Self-pay | Admitting: Gastroenterology

## 2024-12-01 DIAGNOSIS — K743 Primary biliary cirrhosis: Secondary | ICD-10-CM

## 2024-12-05 ENCOUNTER — Ambulatory Visit
Admission: RE | Admit: 2024-12-05 | Discharge: 2024-12-05 | Disposition: A | Discharge status: Home / Self Care | Source: Ambulatory Visit | Attending: Gastroenterology | Admitting: Gastroenterology

## 2024-12-05 DIAGNOSIS — K743 Primary biliary cirrhosis: Secondary | ICD-10-CM | POA: Diagnosis present

## 2024-12-06 ENCOUNTER — Ambulatory Visit: Payer: Self-pay | Admitting: Gastroenterology

## 2024-12-07 ENCOUNTER — Encounter: Payer: Self-pay | Admitting: Nurse Practitioner

## 2024-12-07 MED ORDER — ECONAZOLE NITRATE 1 % EX CREA: TOPICAL_CREAM | CUTANEOUS | 4 refills | Status: AC

## 2024-12-07 NOTE — Progress Notes (Signed)
" ° °  12/07/2024  Patient ID: [name], female   DOB: 1956/09/12, 69 y.o.   MRN: [id number]  Clinic routed request from PCP in regard to tinea affecting pannus and area underneath breasts.  Patient has tried OTC topicals as well as Nystop  with no resolve.  She has cirrhosis of the liver and takes sotalol for atrial fibrillation; so oral azoles, terbinafine, and griseofulvin are CI.  Recommend topical ketoconazole 2% or Econazole if not already tried.  Medications do have some systemic absorption, but this is <10%.  Provider going to try econazole; if this does not work, she will likely refer to dermatology.  [name], PharmD, DPLA  "

## 2024-12-11 ENCOUNTER — Telehealth: Payer: Self-pay

## 2024-12-11 NOTE — Telephone Encounter (Signed)
 LVM to schedule 6 month follow up CT.

## 2024-12-28 ENCOUNTER — Ambulatory Visit (INDEPENDENT_AMBULATORY_CARE_PROVIDER_SITE_OTHER)

## 2024-12-28 DIAGNOSIS — Z23 Encounter for immunization: Secondary | ICD-10-CM

## 2024-12-28 NOTE — Progress Notes (Signed)
 Patient is in office today for a nurse visit for Immunization. Patient Injection was given in the  Right deltoid. Patient tolerated injection well.

## 2025-01-01 ENCOUNTER — Telehealth: Payer: Self-pay | Admitting: *Deleted

## 2025-01-02 ENCOUNTER — Encounter: Payer: Self-pay | Admitting: Nurse Practitioner

## 2025-01-02 ENCOUNTER — Telehealth: Payer: Self-pay

## 2025-01-02 NOTE — Telephone Encounter (Signed)
 Called and LVM asking for patient to please return my call.   OK for E2C2 to speak with patient if she calls back and provide her with the message from Jolene.

## 2025-01-03 NOTE — Telephone Encounter (Signed)
 Tried calling patient, no answer. Mychart message sent to patient with the information regarding CT scan scheduling. Last log in was yesterday, 01/02/25.

## 2025-01-10 ENCOUNTER — Ambulatory Visit

## 2025-01-10 ENCOUNTER — Other Ambulatory Visit

## 2025-01-23 ENCOUNTER — Ambulatory Visit: Payer: PPO

## 2025-03-02 ENCOUNTER — Ambulatory Visit: Admitting: Nurse Practitioner

## 2025-05-02 ENCOUNTER — Ambulatory Visit: Admitting: Oncology

## 2025-05-02 ENCOUNTER — Other Ambulatory Visit

## 2025-05-23 ENCOUNTER — Ambulatory Visit: Admitting: Student in an Organized Health Care Education/Training Program
